# Patient Record
Sex: Male | Born: 1965 | Race: Black or African American | Hispanic: No | Marital: Single | Smoking: Former smoker
Health system: Southern US, Community
[De-identification: ages and names within clinical notes are randomized; demographics above are authoritative.]

## PROBLEM LIST (undated history)

## (undated) DIAGNOSIS — J45909 Unspecified asthma, uncomplicated: Secondary | ICD-10-CM

## (undated) DIAGNOSIS — C801 Malignant (primary) neoplasm, unspecified: Secondary | ICD-10-CM

## (undated) DIAGNOSIS — M1990 Unspecified osteoarthritis, unspecified site: Secondary | ICD-10-CM

## (undated) DIAGNOSIS — K603 Anal fistula, unspecified: Secondary | ICD-10-CM

## (undated) DIAGNOSIS — H9190 Unspecified hearing loss, unspecified ear: Secondary | ICD-10-CM

## (undated) DIAGNOSIS — E079 Disorder of thyroid, unspecified: Secondary | ICD-10-CM

## (undated) DIAGNOSIS — R350 Frequency of micturition: Secondary | ICD-10-CM

## (undated) DIAGNOSIS — K219 Gastro-esophageal reflux disease without esophagitis: Secondary | ICD-10-CM

## (undated) DIAGNOSIS — Z9221 Personal history of antineoplastic chemotherapy: Secondary | ICD-10-CM

## (undated) DIAGNOSIS — L729 Follicular cyst of the skin and subcutaneous tissue, unspecified: Secondary | ICD-10-CM

## (undated) HISTORY — PX: HEMORRHOID SURGERY: SHX153

## (undated) HISTORY — PX: VASECTOMY: SHX75

## (undated) HISTORY — PX: BLADDER SURGERY: SHX569

## (undated) HISTORY — PX: NO PAST SURGERIES: SHX2092

---

## 2015-10-05 ENCOUNTER — Encounter (HOSPITAL_COMMUNITY): Payer: Self-pay | Admitting: *Deleted

## 2015-10-05 ENCOUNTER — Emergency Department (HOSPITAL_COMMUNITY): Payer: Medicare Other

## 2015-10-05 ENCOUNTER — Emergency Department (HOSPITAL_COMMUNITY)
Admission: EM | Admit: 2015-10-05 | Discharge: 2015-10-05 | Disposition: A | Discharge status: Home / Self Care | Payer: Medicare Other | Attending: Emergency Medicine | Admitting: Emergency Medicine

## 2015-10-05 DIAGNOSIS — R079 Chest pain, unspecified: Secondary | ICD-10-CM | POA: Diagnosis not present

## 2015-10-05 DIAGNOSIS — Z87891 Personal history of nicotine dependence: Secondary | ICD-10-CM | POA: Diagnosis not present

## 2015-10-05 DIAGNOSIS — J45909 Unspecified asthma, uncomplicated: Secondary | ICD-10-CM | POA: Diagnosis present

## 2015-10-05 DIAGNOSIS — J4531 Mild persistent asthma with (acute) exacerbation: Secondary | ICD-10-CM | POA: Diagnosis not present

## 2015-10-05 DIAGNOSIS — R05 Cough: Secondary | ICD-10-CM | POA: Diagnosis not present

## 2015-10-05 HISTORY — DX: Unspecified asthma, uncomplicated: J45.909

## 2015-10-05 IMAGING — DX DG CHEST 2V
2 series · 2 of 2 positions shown · non-contrast
Comparison: None.

CLINICAL DATA: Chest pain for several days. Coughing and sore
throat. History of asthma.

EXAM:
CHEST  2 VIEW

[chest pa]
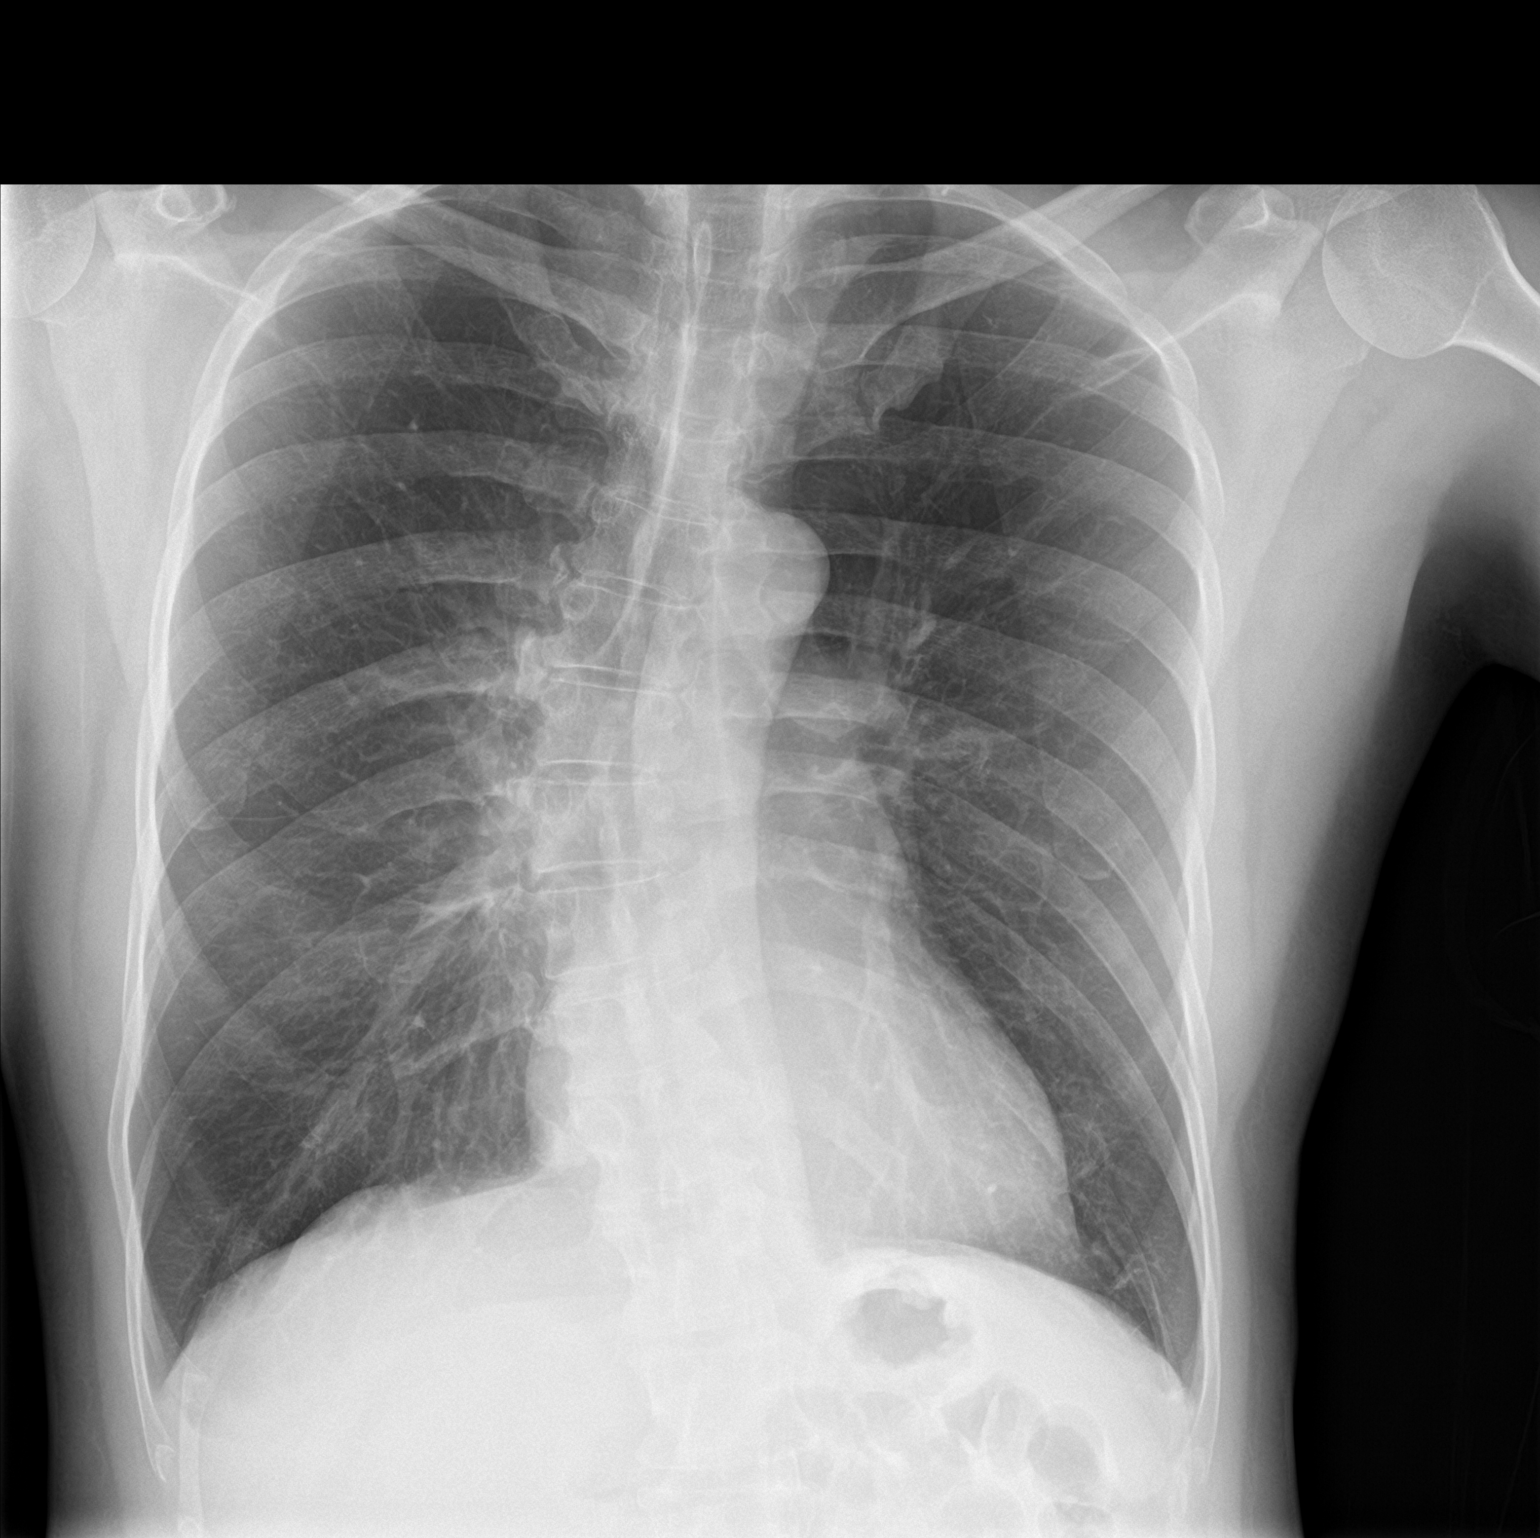

[chest lat]
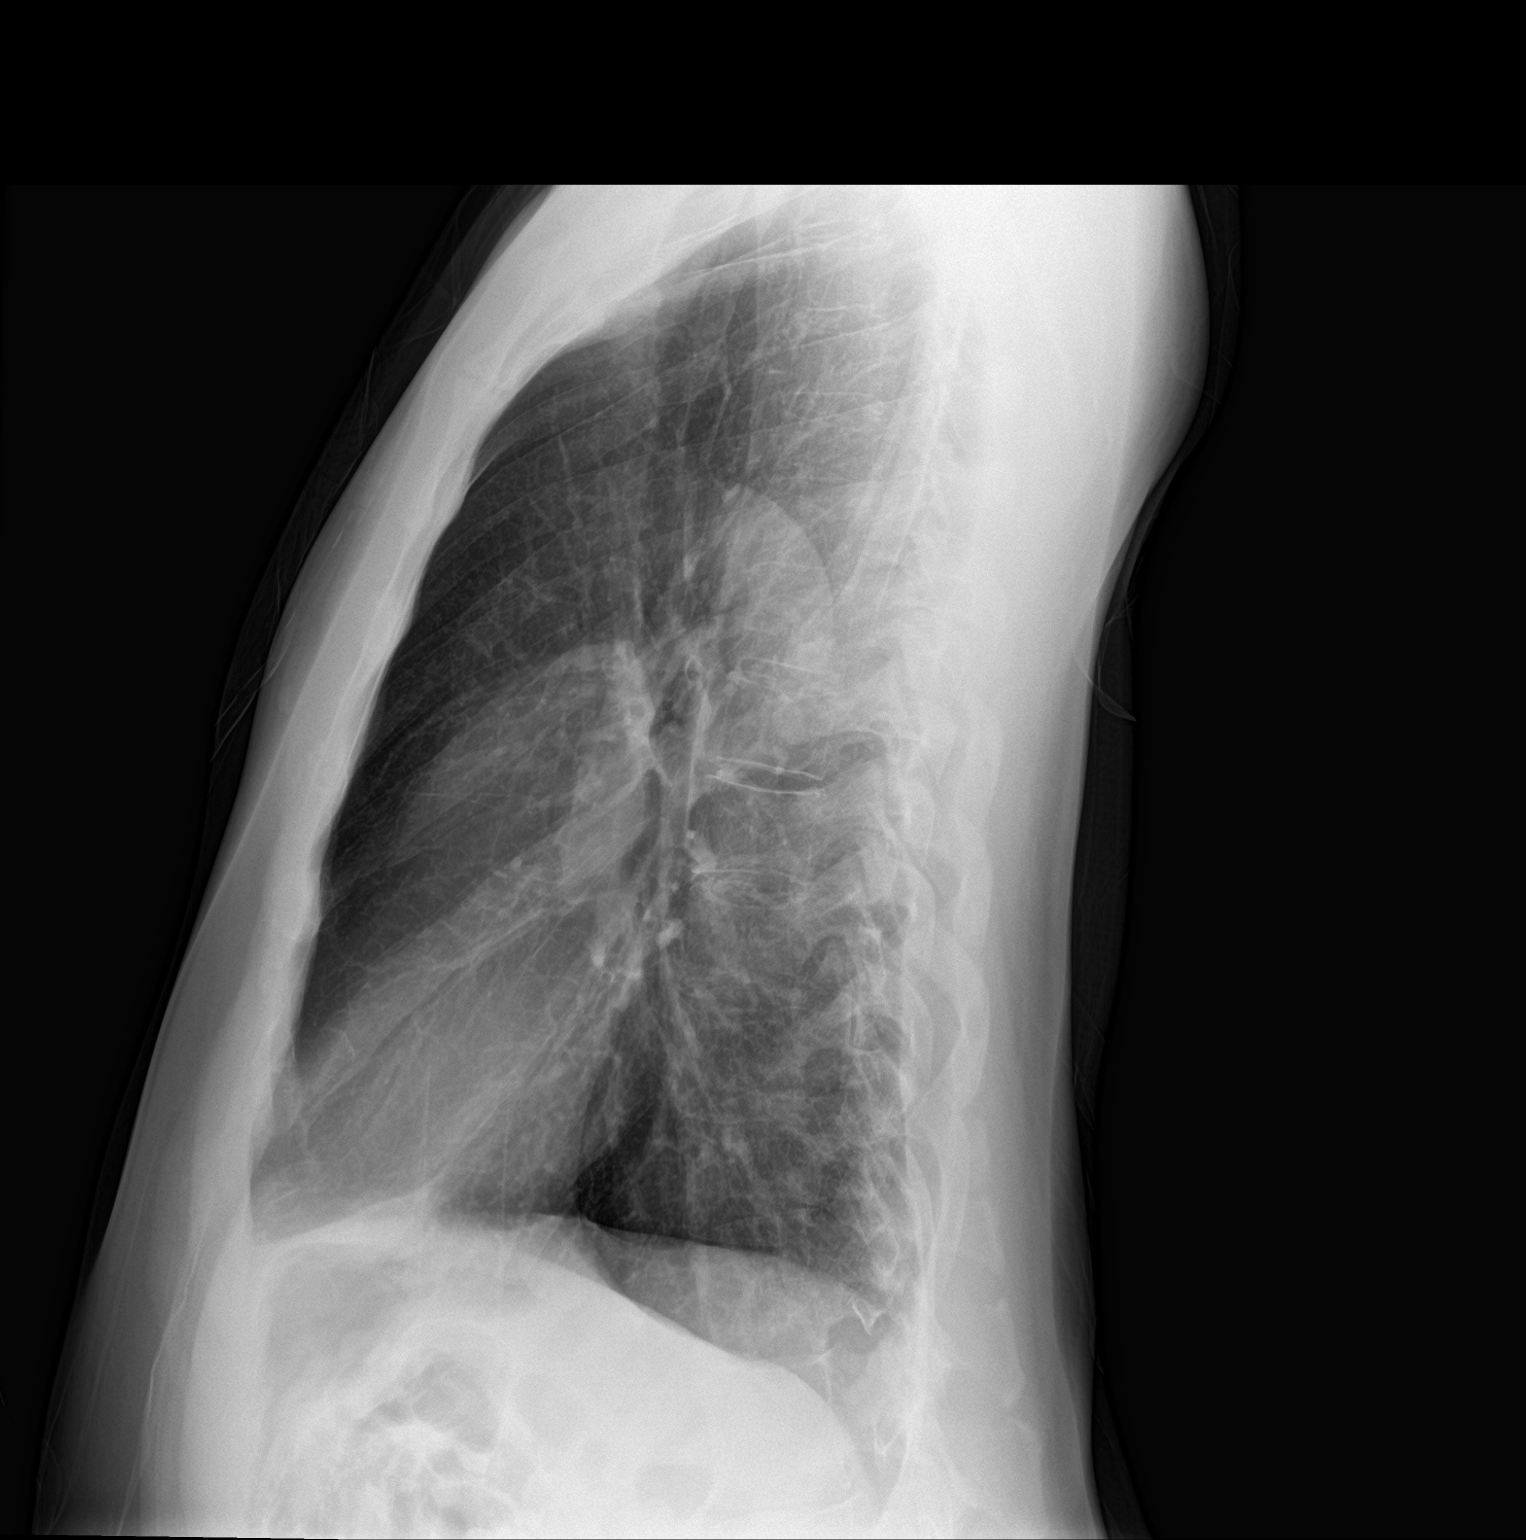

[2 of 2 positions shown; findings below may reference images not displayed]

FINDINGS: The cardiac silhouette, mediastinal and hilar contours are normal.
Moderate hyperinflation of the lungs. No infiltrates or effusions.
Right convex thoracic scoliosis. Reversal of the normal thoracic
kyphosis.
IMPRESSION: Hyperinflation but no infiltrates, edema or effusions.

## 2015-10-05 MED ORDER — ALBUTEROL SULFATE HFA 108 (90 BASE) MCG/ACT IN AERS: 1.0000 | INHALATION_SPRAY | Freq: Four times a day (QID) | RESPIRATORY_TRACT | 0 refills | Status: DC | PRN

## 2015-10-05 MED ORDER — ALBUTEROL SULFATE (2.5 MG/3ML) 0.083% IN NEBU
5.0000 mg | INHALATION_SOLUTION | Freq: Once | RESPIRATORY_TRACT | Status: AC
  Administered 2015-10-05: 5 mg via RESPIRATORY_TRACT
  Filled 2015-10-05: qty 6

## 2015-10-05 MED ORDER — IPRATROPIUM BROMIDE 0.02 % IN SOLN
0.5000 mg | Freq: Once | RESPIRATORY_TRACT | Status: AC
  Administered 2015-10-05: 0.5 mg via RESPIRATORY_TRACT
  Filled 2015-10-05: qty 2.5

## 2015-10-05 MED ORDER — ALBUTEROL SULFATE HFA 108 (90 BASE) MCG/ACT IN AERS
2.0000 | INHALATION_SPRAY | RESPIRATORY_TRACT | Status: DC | PRN
  Administered 2015-10-05: 2 via RESPIRATORY_TRACT
  Filled 2015-10-05: qty 6.7

## 2015-10-05 MED ORDER — ALBUTEROL SULFATE (2.5 MG/3ML) 0.083% IN NEBU
2.5000 mg | INHALATION_SOLUTION | Freq: Once | RESPIRATORY_TRACT | Status: AC
  Administered 2015-10-05: 2.5 mg via RESPIRATORY_TRACT
  Filled 2015-10-05: qty 3

## 2015-10-05 NOTE — ED Provider Notes (Signed)
[facility] DEPT Provider Note   CSN: [id number] Arrival date & time: 10/05/15  1813     History   Chief Complaint Chief Complaint  Patient presents with  . Asthma    HPI Alexander Reeves is a 50 y.o. male.  This is a 50 year old male with a history of asthma who recently relocated to [place] from [place].  He states for the past couple months he's had intermittent episodes of shortness of breath and wheezing on his inhaler is empty and he left his nebulizer machine behind with his packed physicians.  He denies any fever, rhinitis. He does state that he has a history of thyroid disease and is out of his thyroid medication for the past 3 months.  Patient is deaf and I did use the sign language interpreter.  Computer for assistance.      Past Medical History:  Diagnosis Date  . Asthma     There are no active problems to display for this patient.   History reviewed. No pertinent surgical history.     Home Medications    Prior to Admission medications   Medication Sig Start Date End Date Taking? Authorizing Provider  albuterol (PROVENTIL HFA;VENTOLIN HFA) 108 (90 Base) MCG/ACT inhaler Inhale 1-2 puffs into the lungs every 6 (six) hours as needed for wheezing or shortness of breath. 10/05/15   Junius Creamer, NP    Family History No family history on file.  Social History Social History  Substance Use Topics  . Smoking status: Former Research scientist (life sciences)  . Smokeless tobacco: Never Used  . Alcohol use No     Allergies   Review of patient's allergies indicates no known allergies.   Review of Systems Review of Systems  Constitutional: Negative for fever.  HENT: Positive for sore throat.   Respiratory: Positive for wheezing.   All other systems reviewed and are negative.    Physical Exam Updated Vital Signs BP 138/72 (BP Location: Right Arm)   Pulse 94   Temp 98.6 F (37 C) (Oral)   Ht 5\' 10"  (1.778 m)   Wt 84.4 kg   SpO2 100%   BMI 26.69 kg/m    Physical Exam  Constitutional: He appears well-developed and well-nourished.  Eyes: Pupils are equal, round, and reactive to light.  Cardiovascular: Normal rate.   Pulmonary/Chest: He has wheezes.  I examined the patient, after received an albuterol Atrovent treatment in triage.  She still has a slight expiratory wheeze in the left base  Lymphadenopathy:    He has no cervical adenopathy.  Neurological: He is alert.  Nursing note and vitals reviewed.    ED Treatments / Results  Labs (all labs ordered are listed, but only abnormal results are displayed) Labs Reviewed - No data to display  EKG  EKG Interpretation None       Radiology Dg Chest 2 View  Result Date: 10/05/2015 CLINICAL DATA:  Chest pain for several days. Coughing and sore throat. History of asthma. EXAM: CHEST  2 VIEW COMPARISON:  None. FINDINGS: The cardiac silhouette, mediastinal and hilar contours are normal. Moderate hyperinflation of the lungs. No infiltrates or effusions. Right convex thoracic scoliosis. Reversal of the normal thoracic kyphosis. IMPRESSION: Hyperinflation but no infiltrates, edema or effusions. Electronically Signed   By: Marijo Sanes M.D.   On: 10/05/2015 19:12    Procedures Procedures (including critical care time)  Medications Ordered in ED Medications  albuterol (PROVENTIL HFA;VENTOLIN HFA) 108 (90 Base) MCG/ACT inhaler 2 puff (not administered)  albuterol (PROVENTIL) (2.5  MG/3ML) 0.083% nebulizer solution 5 mg (5 mg Nebulization Given 10/05/15 1837)  ipratropium (ATROVENT) nebulizer solution 0.5 mg (0.5 mg Nebulization Given 10/05/15 1837)  albuterol (PROVENTIL) (2.5 MG/3ML) 0.083% nebulizer solution 2.5 mg (2.5 mg Nebulization Given 10/05/15 2120)     Initial Impression / Assessment and Plan / ED Course  I have reviewed the triage vital signs and the nursing notes.  Pertinent labs & imaging results that were available during my care of the patient were reviewed by me and  considered in my medical decision making (see chart for details).  Patient's x-ray was reviewed.  There is no infiltrate to indicate a pneumonia examined after second nebulizer treatment given.  He is clear.  He has been provided with a inhaler plus a prescription for additional inhaler and given a referral to community wellness to establish medical care.  Final Clinical Impressions(s) / ED Diagnoses   Final diagnoses:  Asthma, mild persistent, with acute exacerbation    New Prescriptions New Prescriptions   ALBUTEROL (PROVENTIL HFA;VENTOLIN HFA) 108 (90 BASE) MCG/ACT INHALER    Inhale 1-2 puffs into the lungs every 6 (six) hours as needed for wheezing or shortness of breath.     Junius Creamer, NP 10/05/15 [place], MD 10/07/15 [phone]

## 2015-10-05 NOTE — ED Triage Notes (Signed)
The pt is c/o his asthma bothering him for several days.  He moved here from [place] .  He is out of his inhaler he is also c/o coughing and a sorehtroat

## 2015-10-05 NOTE — ED Triage Notes (Signed)
The pt is deaf  Information from the intrepreter machine

## 2015-10-05 NOTE — Discharge Instructions (Signed)
You treated for asthma exacerbation.  You were provided with an inhaler.  Please uses as needed 2 puffs every 4-6 hours while awake.  You've also been given a referral to community wellness, please go to the clinic to make an appointment for both you and your wife to establish medical care.  You've also been given a prescription for an additional inhaler.

## 2015-12-07 ENCOUNTER — Emergency Department (HOSPITAL_COMMUNITY): Payer: Medicare Other

## 2015-12-07 ENCOUNTER — Encounter (HOSPITAL_COMMUNITY): Payer: Self-pay | Admitting: Emergency Medicine

## 2015-12-07 ENCOUNTER — Emergency Department (HOSPITAL_COMMUNITY)
Admission: EM | Admit: 2015-12-07 | Discharge: 2015-12-07 | Disposition: A | Discharge status: Home / Self Care | Payer: Medicare Other | Attending: Emergency Medicine | Admitting: Emergency Medicine

## 2015-12-07 DIAGNOSIS — J45901 Unspecified asthma with (acute) exacerbation: Secondary | ICD-10-CM | POA: Insufficient documentation

## 2015-12-07 DIAGNOSIS — Z87891 Personal history of nicotine dependence: Secondary | ICD-10-CM | POA: Diagnosis not present

## 2015-12-07 DIAGNOSIS — R0602 Shortness of breath: Secondary | ICD-10-CM | POA: Diagnosis not present

## 2015-12-07 HISTORY — DX: Disorder of thyroid, unspecified: E07.9

## 2015-12-07 LAB — BASIC METABOLIC PANEL
ANION GAP: 10 (ref 5–15)
BUN: 15 mg/dL (ref 6–20)
CALCIUM: 9 mg/dL (ref 8.9–10.3)
CO2: 26 mmol/L (ref 22–32)
Chloride: 103 mmol/L (ref 101–111)
Creatinine, Ser: 1.31 mg/dL — ABNORMAL HIGH (ref 0.61–1.24)
GFR calc non Af Amer: >60 mL/min (ref >60)
GLUCOSE: 115 mg/dL — AB (ref 65–99)
POTASSIUM: 3.8 mmol/L (ref 3.5–5.1)
Sodium: 139 mmol/L (ref 135–145)

## 2015-12-07 LAB — CBC WITH DIFFERENTIAL/PLATELET
BASOS ABS: 0 10*3/uL (ref 0.0–0.1)
BASOS PCT: 0 %
Eosinophils Absolute: 0.1 10*3/uL (ref 0.0–0.7)
Eosinophils Relative: 1 %
HEMATOCRIT: 41.8 % (ref 39.0–52.0)
HEMOGLOBIN: 13.2 g/dL (ref 13.0–17.0)
LYMPHS PCT: 24 %
Lymphs Abs: 1.9 10*3/uL (ref 0.7–4.0)
MCH: 26 pg (ref 26.0–34.0)
MCHC: 31.6 g/dL (ref 30.0–36.0)
MCV: 82.3 fL (ref 78.0–100.0)
MONO ABS: 0.2 10*3/uL (ref 0.1–1.0)
Monocytes Relative: 3 %
NEUTROS ABS: 5.7 10*3/uL (ref 1.7–7.7)
NEUTROS PCT: 72 %
Platelets: 125 10*3/uL — ABNORMAL LOW (ref 150–400)
RBC: 5.08 MIL/uL (ref 4.22–5.81)
RDW: 15 % (ref 11.5–15.5)
WBC: 7.9 10*3/uL (ref 4.0–10.5)

## 2015-12-07 IMAGING — CR DG CHEST 2V
2 series · 2 of 2 positions shown · non-contrast
Comparison: [DATE]

CLINICAL DATA: Shortness of breath and throat tightness. History of
asthma.

EXAM:
CHEST  2 VIEW

[chest pa]
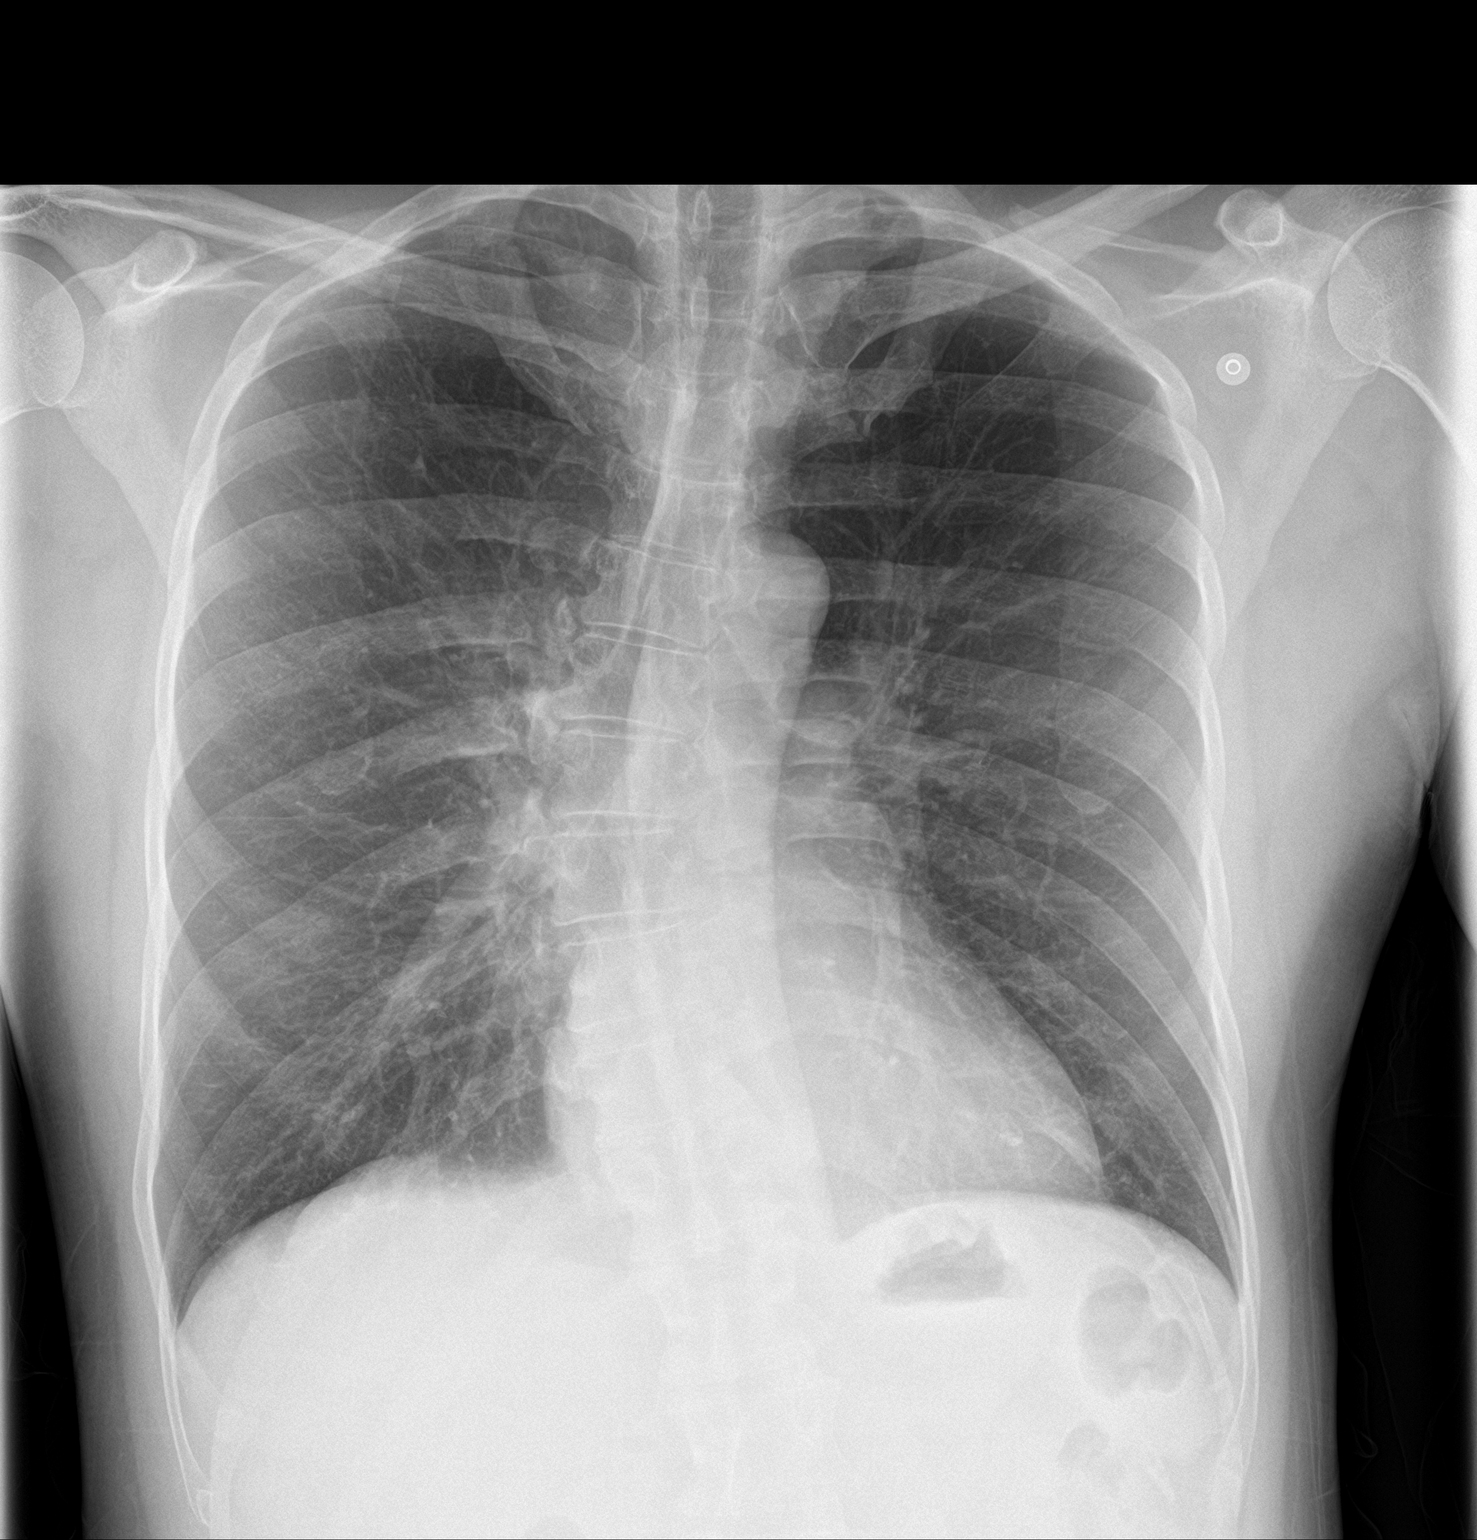

[chest lat]
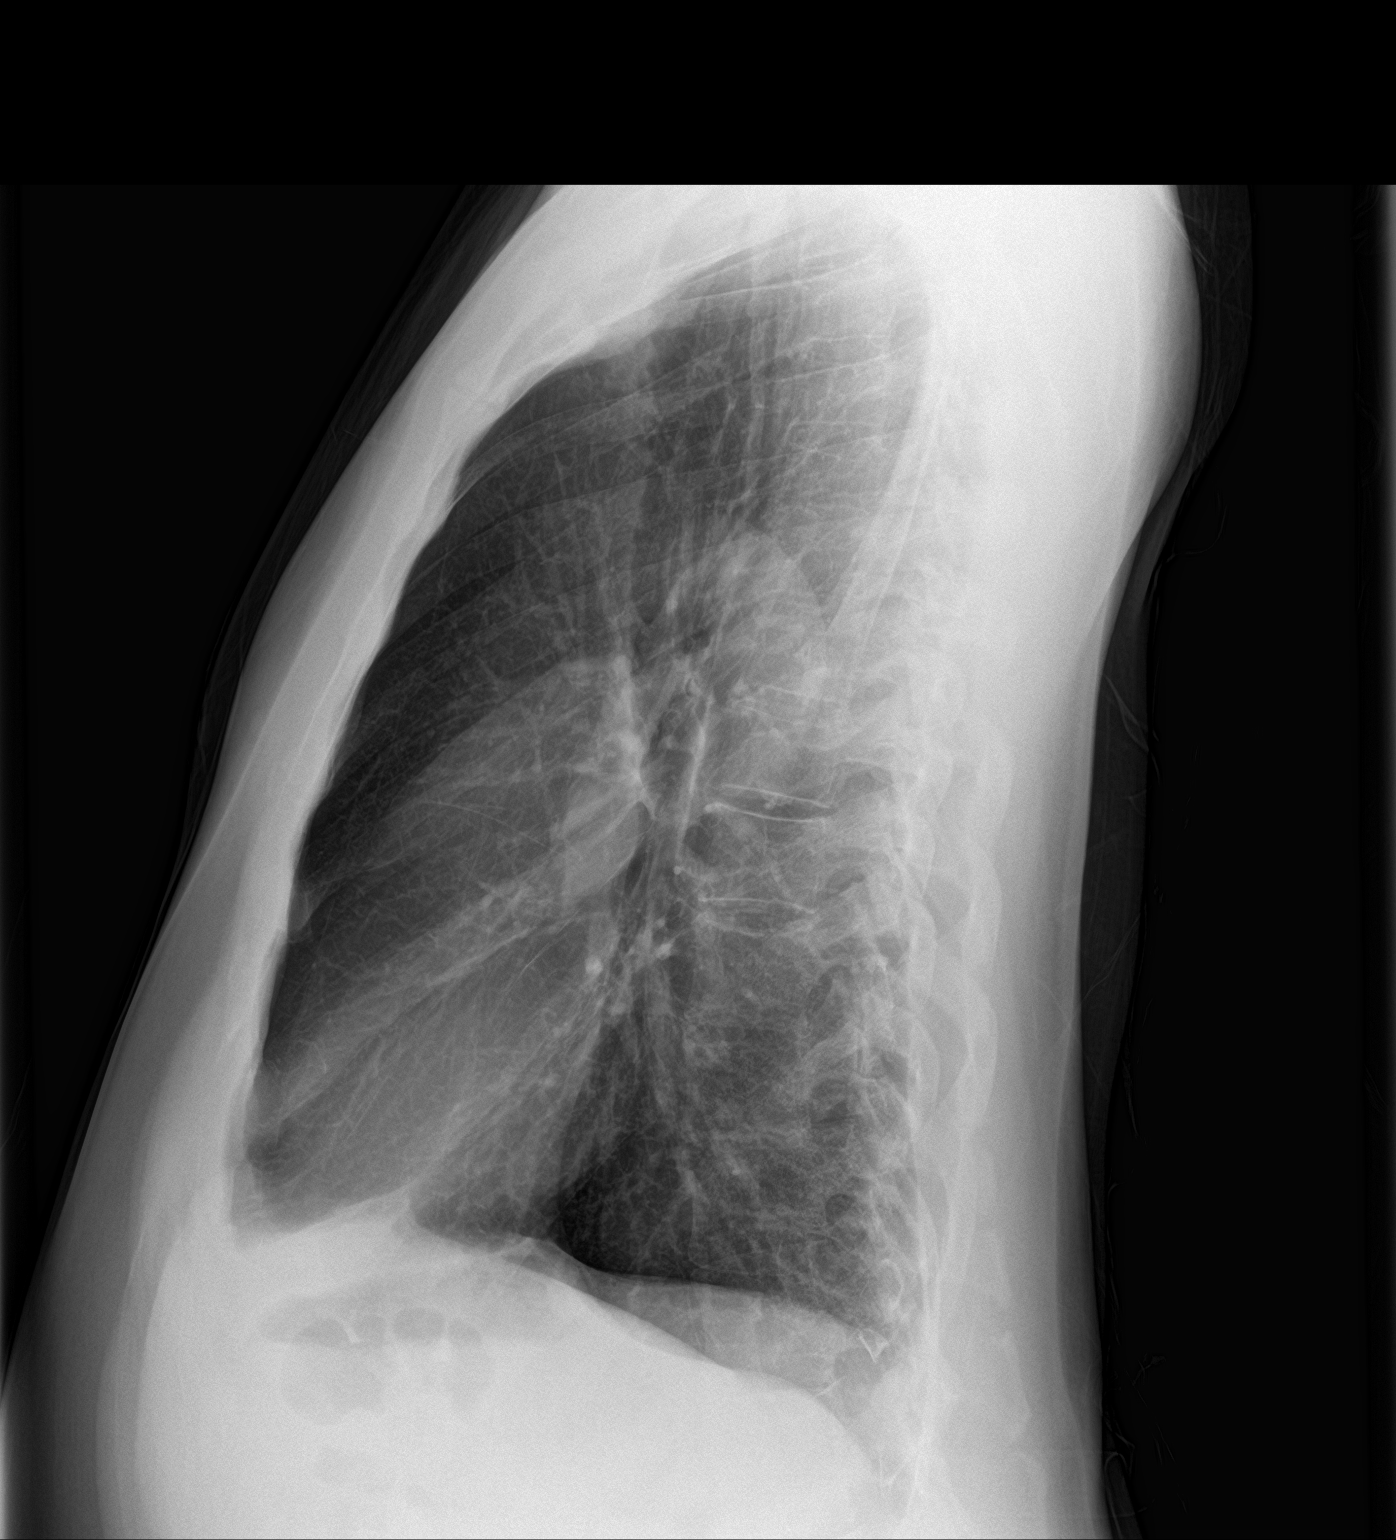

[2 of 2 positions shown; findings below may reference images not displayed]

FINDINGS: Hyperinflation with mild interstitial changes in the lung bases
consistent with history of asthma. No focal airspace disease or
consolidation. No blunting of costophrenic angles. No pneumothorax.
Mediastinal contours appear intact. Thoracic scoliosis convex
towards the right
IMPRESSION: Hyperinflation.  No evidence of active pulmonary disease.

## 2015-12-07 MED ORDER — ALBUTEROL (5 MG/ML) CONTINUOUS INHALATION SOLN
10.0000 mg/h | INHALATION_SOLUTION | RESPIRATORY_TRACT | Status: DC
  Administered 2015-12-07: 10 mg/h via RESPIRATORY_TRACT
  Filled 2015-12-07: qty 20

## 2015-12-07 MED ORDER — ALBUTEROL SULFATE HFA 108 (90 BASE) MCG/ACT IN AERS
2.0000 | INHALATION_SPRAY | Freq: Once | RESPIRATORY_TRACT | Status: AC
  Administered 2015-12-07: 2 via RESPIRATORY_TRACT
  Filled 2015-12-07: qty 6.7

## 2015-12-07 MED ORDER — PREDNISONE 20 MG PO TABS: 60.0000 mg | ORAL_TABLET | Freq: Every day | ORAL | 0 refills | Status: DC

## 2015-12-07 MED ORDER — IPRATROPIUM BROMIDE 0.02 % IN SOLN
0.5000 mg | Freq: Once | RESPIRATORY_TRACT | Status: AC
  Administered 2015-12-07: 0.5 mg via RESPIRATORY_TRACT
  Filled 2015-12-07: qty 2.5

## 2015-12-07 MED ORDER — ALBUTEROL SULFATE (2.5 MG/3ML) 0.083% IN NEBU: 2.5000 mg | INHALATION_SOLUTION | Freq: Four times a day (QID) | RESPIRATORY_TRACT | 0 refills | Status: DC | PRN

## 2015-12-07 MED ORDER — MAGNESIUM SULFATE 2 GM/50ML IV SOLN
2.0000 g | Freq: Once | INTRAVENOUS | Status: AC
  Administered 2015-12-07: 2 g via INTRAVENOUS
  Filled 2015-12-07: qty 50

## 2015-12-07 NOTE — ED Provider Notes (Signed)
[facility] DEPT Provider Note   CSN: [id number] Arrival date & time: 12/07/15  0419     History   Chief Complaint Chief Complaint  Patient presents with  . Shortness of Breath    HPI Alexander Reeves is a 50 y.o. male.With a past medical history of asthma presenting today with shortness of breath. Patient ran out of his albuterol medication. EMS states he is 89% on room air with decreased breath sounds. He was given albuterol 5 mg, ipratropium 0.5 mg, and Solu-Medrol 125 mg. Patient states his breathing is better. He denies any chest pain, fever, productive cough. There are no further complaints.  10 Systems reviewed and are negative for acute change except as noted in the HPI.    HPI  Past Medical History:  Diagnosis Date  . Asthma     There are no active problems to display for this patient.   No past surgical history on file.     Home Medications    Prior to Admission medications   Medication Sig Start Date End Date Taking? Authorizing Provider  albuterol (PROVENTIL HFA;VENTOLIN HFA) 108 (90 Base) MCG/ACT inhaler Inhale 1-2 puffs into the lungs every 6 (six) hours as needed for wheezing or shortness of breath. 10/05/15   Junius Creamer, NP    Family History No family history on file.  Social History Social History  Substance Use Topics  . Smoking status: Former Research scientist (life sciences)  . Smokeless tobacco: Never Used  . Alcohol use No     Allergies   Patient has no known allergies.   Review of Systems Review of Systems   Physical Exam Updated Vital Signs BP 106/88 (BP Location: Right Arm)   Pulse 91   Temp 97.6 F (36.4 C) (Oral)   Resp 20   SpO2 100%   Physical Exam  Constitutional: He is oriented to person, place, and time. Vital signs are normal. He appears well-developed and well-nourished.  Non-toxic appearance. He does not appear ill. No distress.  Breathing treatment currently in progress  HENT:  Head: Normocephalic and atraumatic.  Nose: Nose  normal.  Mouth/Throat: Oropharynx is clear and moist. No oropharyngeal exudate.  Eyes: Conjunctivae and EOM are normal. Pupils are equal, round, and reactive to light. No scleral icterus.  Neck: Normal range of motion. Neck supple. No tracheal deviation, no edema, no erythema and normal range of motion present. No thyroid mass and no thyromegaly present.  Cardiovascular: Normal rate, regular rhythm, S1 normal, S2 normal, normal heart sounds, intact distal pulses and normal pulses.  Exam reveals no gallop and no friction rub.   No murmur heard. Pulmonary/Chest: Effort normal. No respiratory distress. He has wheezes. He has no rhonchi. He has no rales.  Decreased breath sounds bilaterally. There is wheezing heard bilaterally expiratory.  Abdominal: Soft. Normal appearance and bowel sounds are normal. He exhibits no distension, no ascites and no mass. There is no hepatosplenomegaly. There is no tenderness. There is no rebound, no guarding and no CVA tenderness.  Musculoskeletal: Normal range of motion. He exhibits no edema or tenderness.  Lymphadenopathy:    He has no cervical adenopathy.  Neurological: He is alert and oriented to person, place, and time. He has normal strength. No cranial nerve deficit or sensory deficit.  Skin: Skin is warm, dry and intact. No petechiae and no rash noted. He is not diaphoretic. No erythema. No pallor.  Nursing note and vitals reviewed.    ED Treatments / Results  Labs (all labs ordered are listed,  but only abnormal results are displayed) Labs Reviewed  CBC WITH DIFFERENTIAL/PLATELET  BASIC METABOLIC PANEL    EKG  EKG Interpretation None       Radiology No results found.  Procedures Procedures (including critical care time)  Medications Ordered in ED Medications  albuterol (PROVENTIL,VENTOLIN) solution continuous neb (not administered)  ipratropium (ATROVENT) nebulizer solution 0.5 mg (not administered)  magnesium sulfate IVPB 2 g 50 mL (not  administered)     Initial Impression / Assessment and Plan / ED Course  I have reviewed the triage vital signs and the nursing notes.  Pertinent labs & imaging results that were available during my care of the patient were reviewed by me and considered in my medical decision making (see chart for details).  Clinical Course     Patient presents to the emergency department for asthma exacerbation. He was given continuous epidural treatment, magnesium, and will obtain a chest x-ray to evaluate for any pneumonia. Patient will need to be observed in emergency department for improvement of his respiratory distress.  5:57 AM Upon repeat evaluation, patient is sleeping and breathing comfortably without any wheezing on exam.  Patient also states he feels much better.  Will give him an inhaler to go home with.  Also prednisone for 4 more days.  PCP fu advised within 3 days.  There is no hypoxia on RA.  He appears well and in NAD.  Vs remain within his normal limits, patient tachycardic from albuterol use, and he is safe for DC.  CRITICAL CARE Performed by: Everlene Balls   Total critical care time: 45 minutes - resp distress requiring continuous albuterol  Critical care time was exclusive of separately billable procedures and treating other patients.  Critical care was necessary to treat or prevent imminent or life-threatening deterioration.  Critical care was time spent personally by me on the following activities: development of treatment plan with patient and/or surrogate as well as nursing, discussions with consultants, evaluation of patient's response to treatment, examination of patient, obtaining history from patient or surrogate, ordering and performing treatments and interventions, ordering and review of laboratory studies, ordering and review of radiographic studies, pulse oximetry and re-evaluation of patient's condition.   Final Clinical Impressions(s) / ED Diagnoses   Final diagnoses:    None    New Prescriptions New Prescriptions   No medications on file     Everlene Balls, MD 12/07/15 [phone]

## 2015-12-07 NOTE — ED Notes (Signed)
Pt ambulated on the hallway with steady gait and SPO2 of 97- 98%, NAD noticed.

## 2015-12-07 NOTE — ED Triage Notes (Signed)
Pt brought to ED by [facility] from home for c/o SOB and throat tightness, pt is deaf and has a Hx of Asthma, Albuterol treatment X2, Atrovent 0.5 and 125 mg Solumedrol IV given by EMS, pt 89% on RA on EMS arrival to his house, denies any fever or chills.

## 2016-05-26 ENCOUNTER — Encounter (HOSPITAL_COMMUNITY): Payer: Self-pay | Admitting: Nurse Practitioner

## 2016-05-26 ENCOUNTER — Emergency Department (HOSPITAL_COMMUNITY)
Admission: EM | Admit: 2016-05-26 | Discharge: 2016-05-26 | Disposition: A | Discharge status: Home / Self Care | Payer: Medicare Other | Attending: Emergency Medicine | Admitting: Emergency Medicine

## 2016-05-26 ENCOUNTER — Emergency Department (HOSPITAL_COMMUNITY): Payer: Medicare Other

## 2016-05-26 ENCOUNTER — Ambulatory Visit (INDEPENDENT_AMBULATORY_CARE_PROVIDER_SITE_OTHER): Payer: Medicare Other | Admitting: Physician Assistant

## 2016-05-26 DIAGNOSIS — R918 Other nonspecific abnormal finding of lung field: Secondary | ICD-10-CM | POA: Diagnosis not present

## 2016-05-26 DIAGNOSIS — Z87891 Personal history of nicotine dependence: Secondary | ICD-10-CM | POA: Insufficient documentation

## 2016-05-26 DIAGNOSIS — J45909 Unspecified asthma, uncomplicated: Secondary | ICD-10-CM | POA: Diagnosis not present

## 2016-05-26 DIAGNOSIS — R1310 Dysphagia, unspecified: Secondary | ICD-10-CM | POA: Diagnosis not present

## 2016-05-26 DIAGNOSIS — J189 Pneumonia, unspecified organism: Secondary | ICD-10-CM | POA: Diagnosis not present

## 2016-05-26 DIAGNOSIS — J029 Acute pharyngitis, unspecified: Secondary | ICD-10-CM | POA: Diagnosis not present

## 2016-05-26 DIAGNOSIS — R0602 Shortness of breath: Secondary | ICD-10-CM | POA: Diagnosis present

## 2016-05-26 HISTORY — DX: Unspecified hearing loss, unspecified ear: H91.90

## 2016-05-26 IMAGING — DX DG CHEST 2V
2 series · 2 of 2 positions shown · non-contrast
Comparison: [DATE]

CLINICAL DATA: Patient reports X 2 weeks he has been having trouble
with his throat, 3 years ago he reports he was diagnosed with a
thyroid disease, reports he was given medicine and it helped but for
the last 2 weeks the medicine has not been ...*comment was
truncated*

EXAM:
CHEST  2 VIEW

[w chest pa]
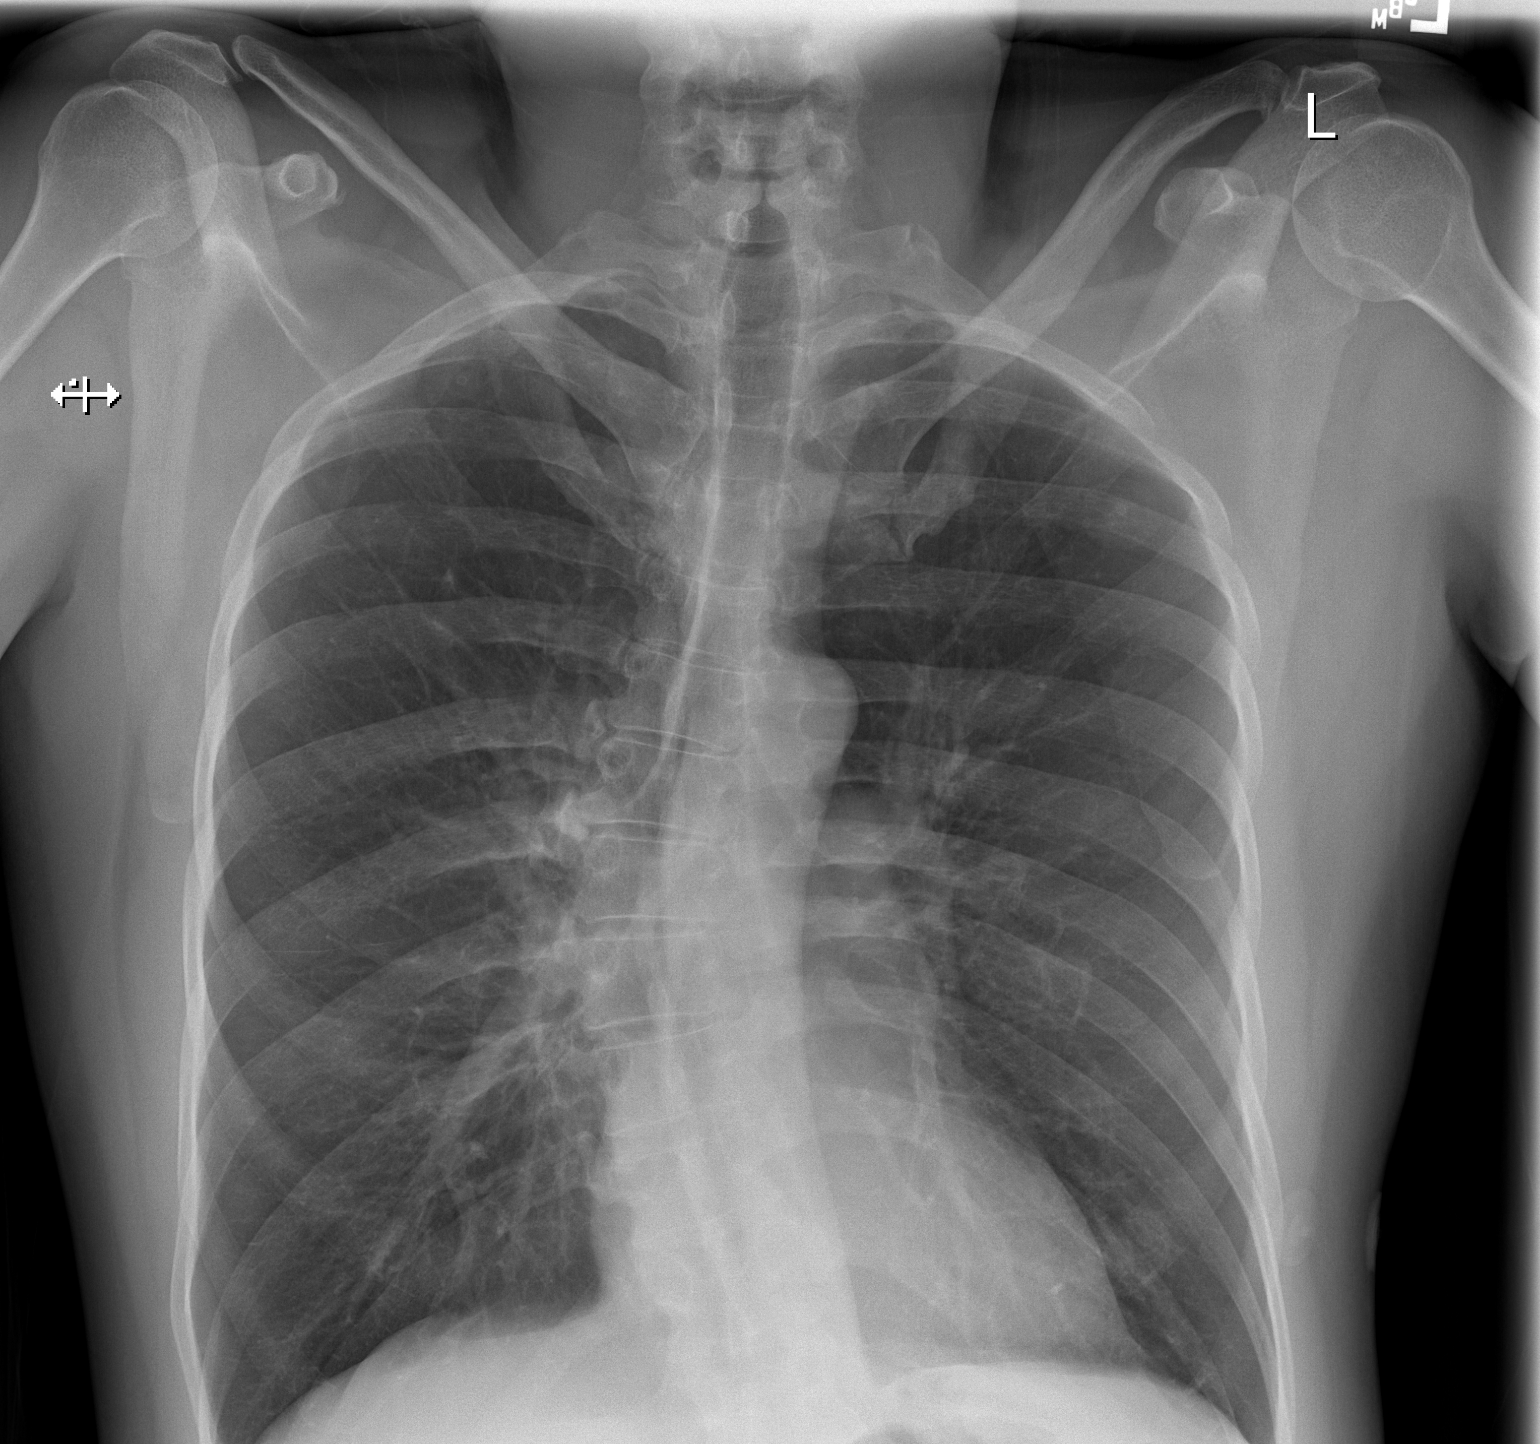

[w chest lat]
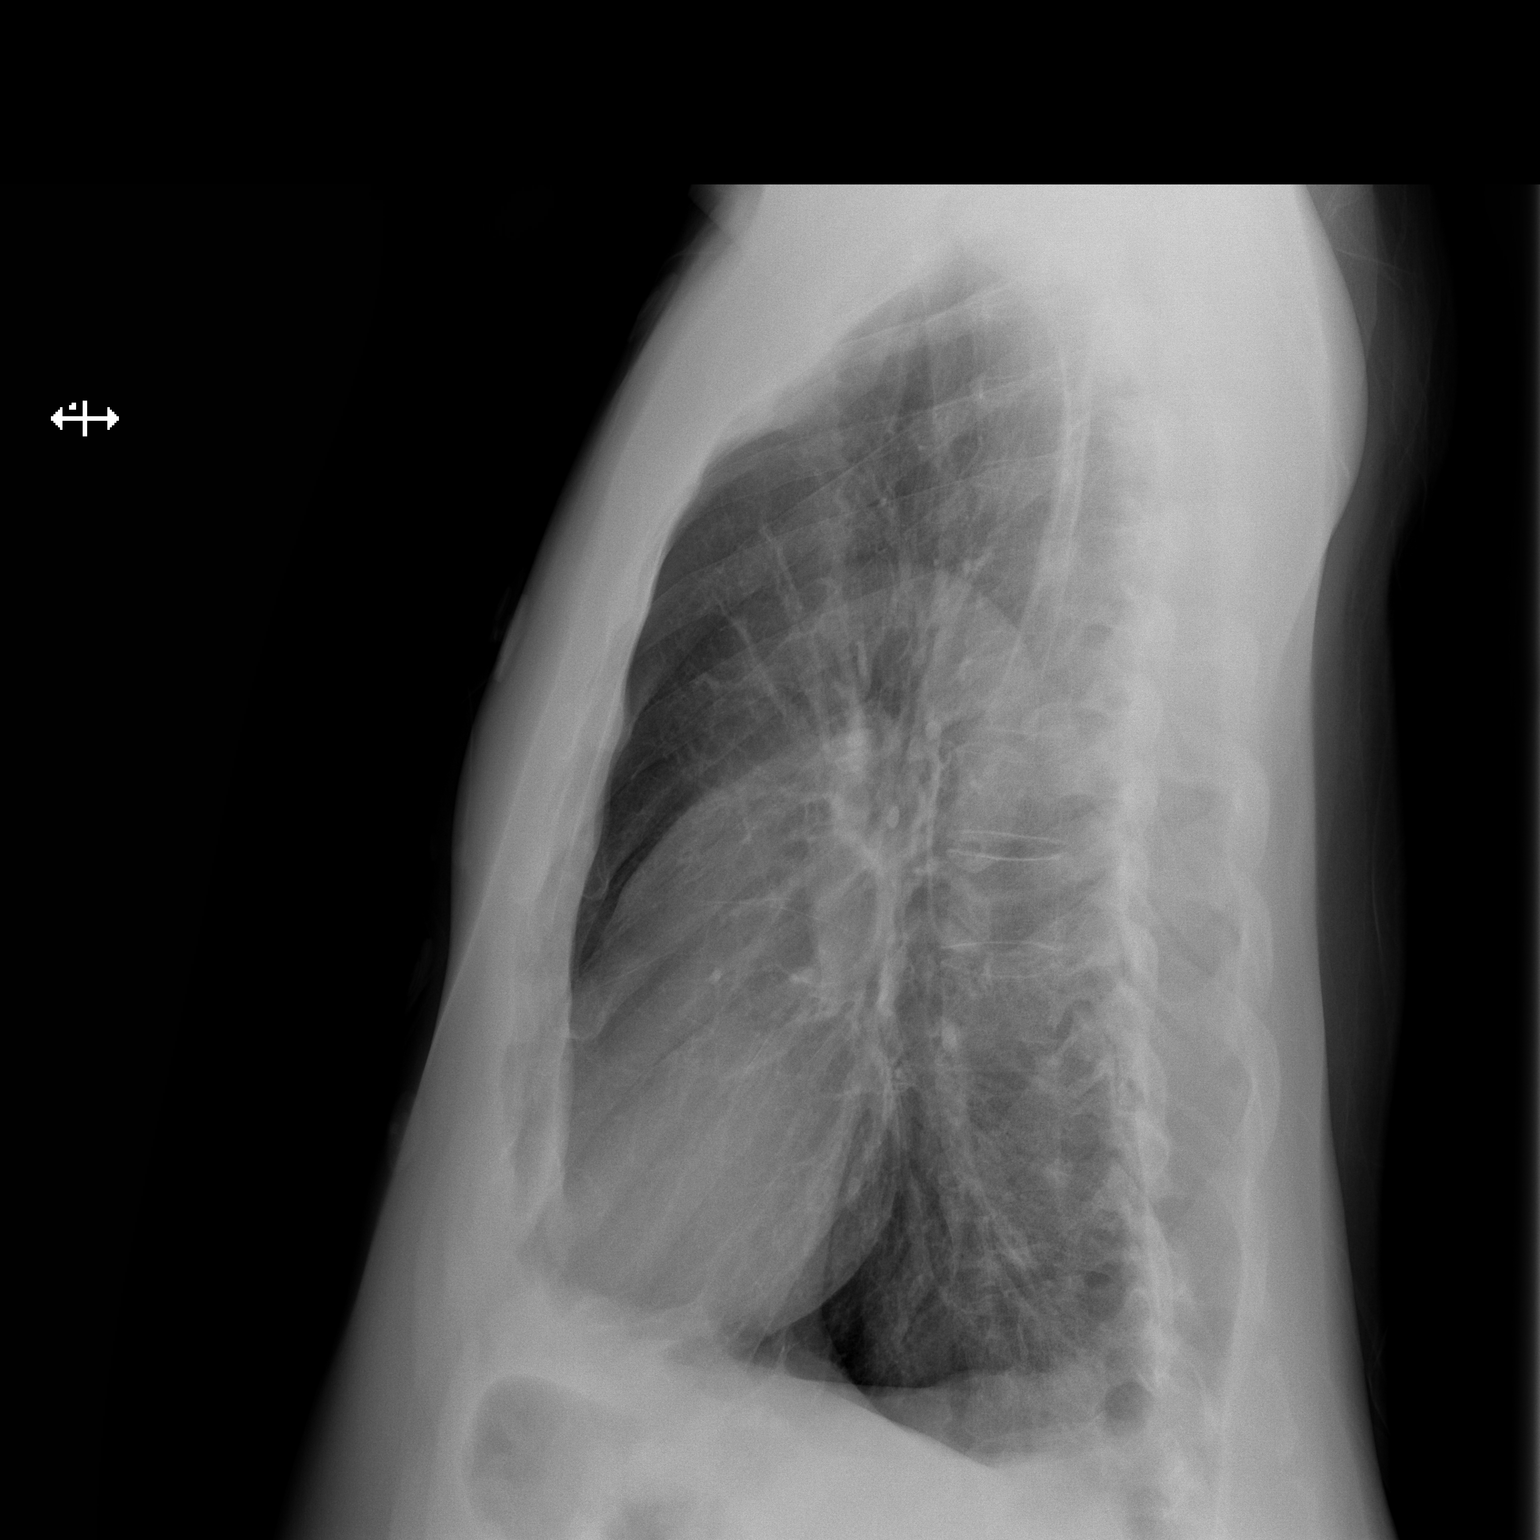

[2 of 2 positions shown; findings below may reference images not displayed]

FINDINGS: Normal cardiac silhouette. Lungs are hyperinflated. No effusion,
infiltrate pneumothorax. Scoliosis noted.
IMPRESSION: 1.  No acute cardiopulmonary process.
2. Hyperinflated lungs.

## 2016-05-26 IMAGING — DX DG NECK SOFT TISSUE
2 series · 2 of 2 positions shown · non-contrast
Comparison: None.

CLINICAL DATA: Dysphagia with sore throat. Symptoms 2 weeks ago.
Headache, body ache, cough, fever.

EXAM:
NECK SOFT TISSUES - 1+ VIEW

[neck lat]
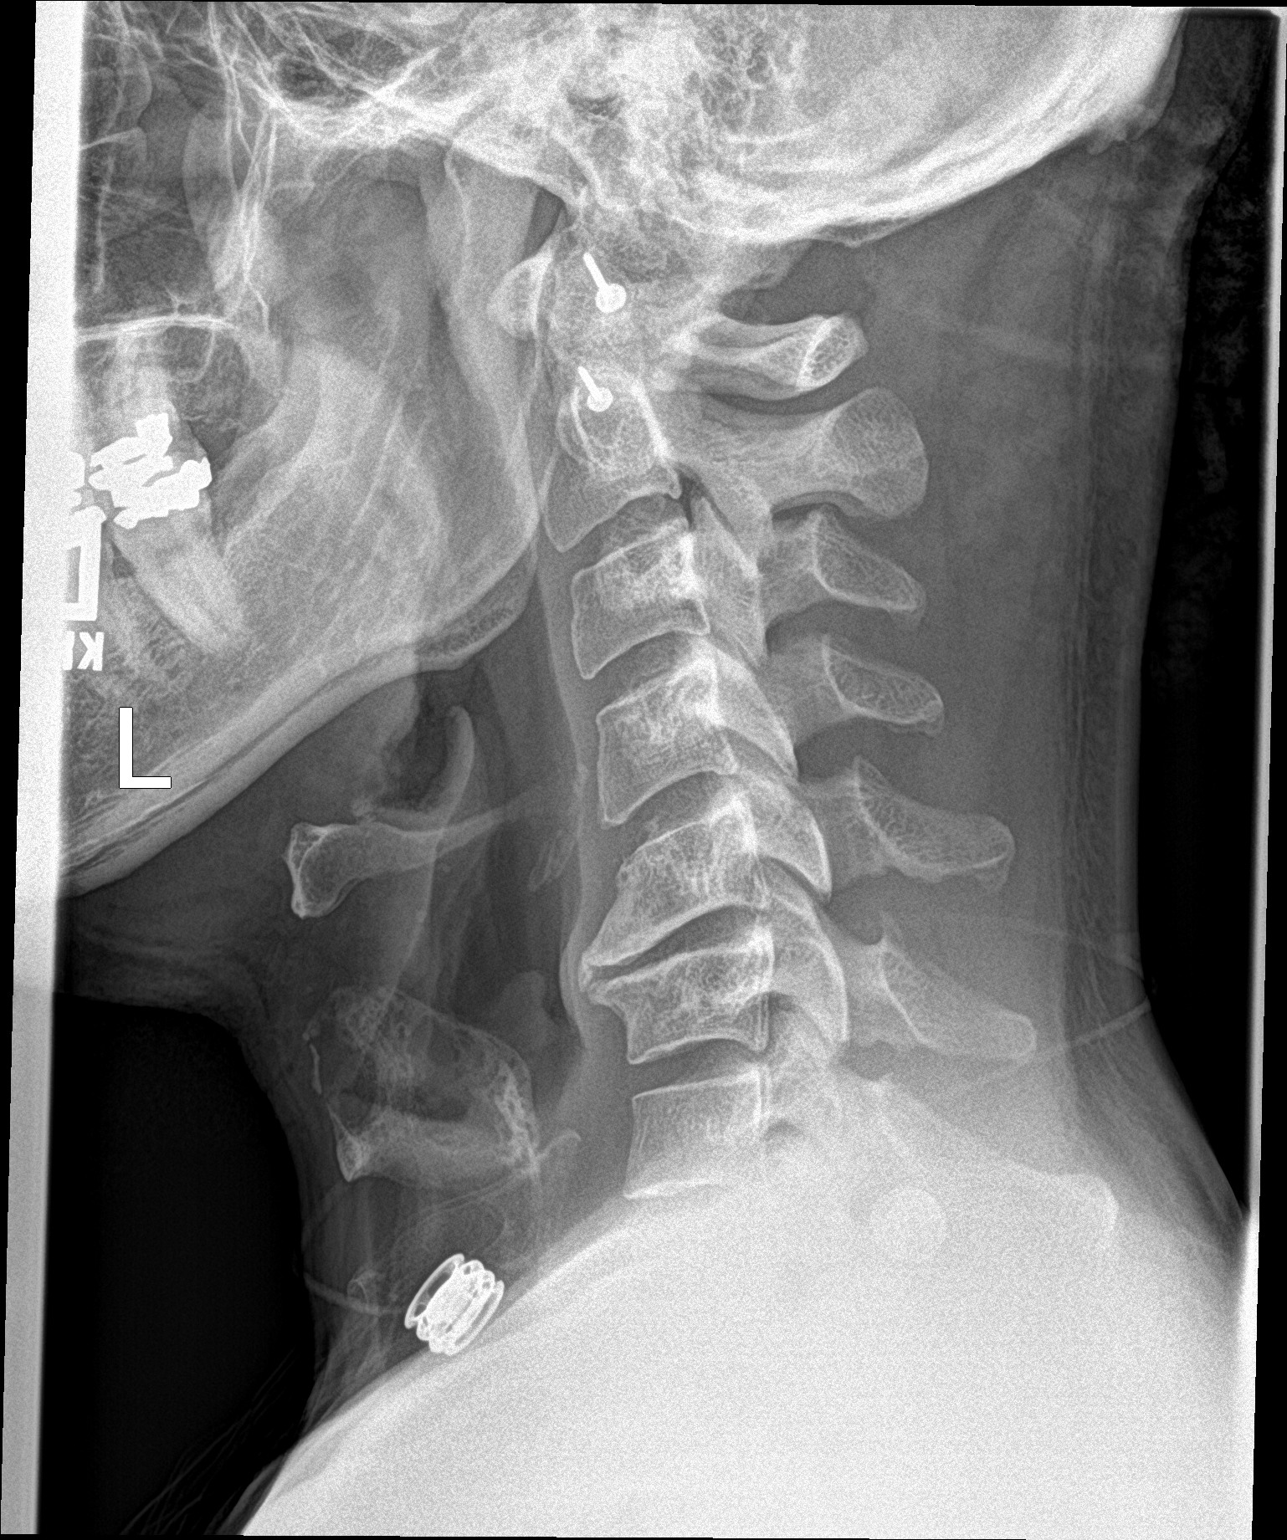

[neck ap]
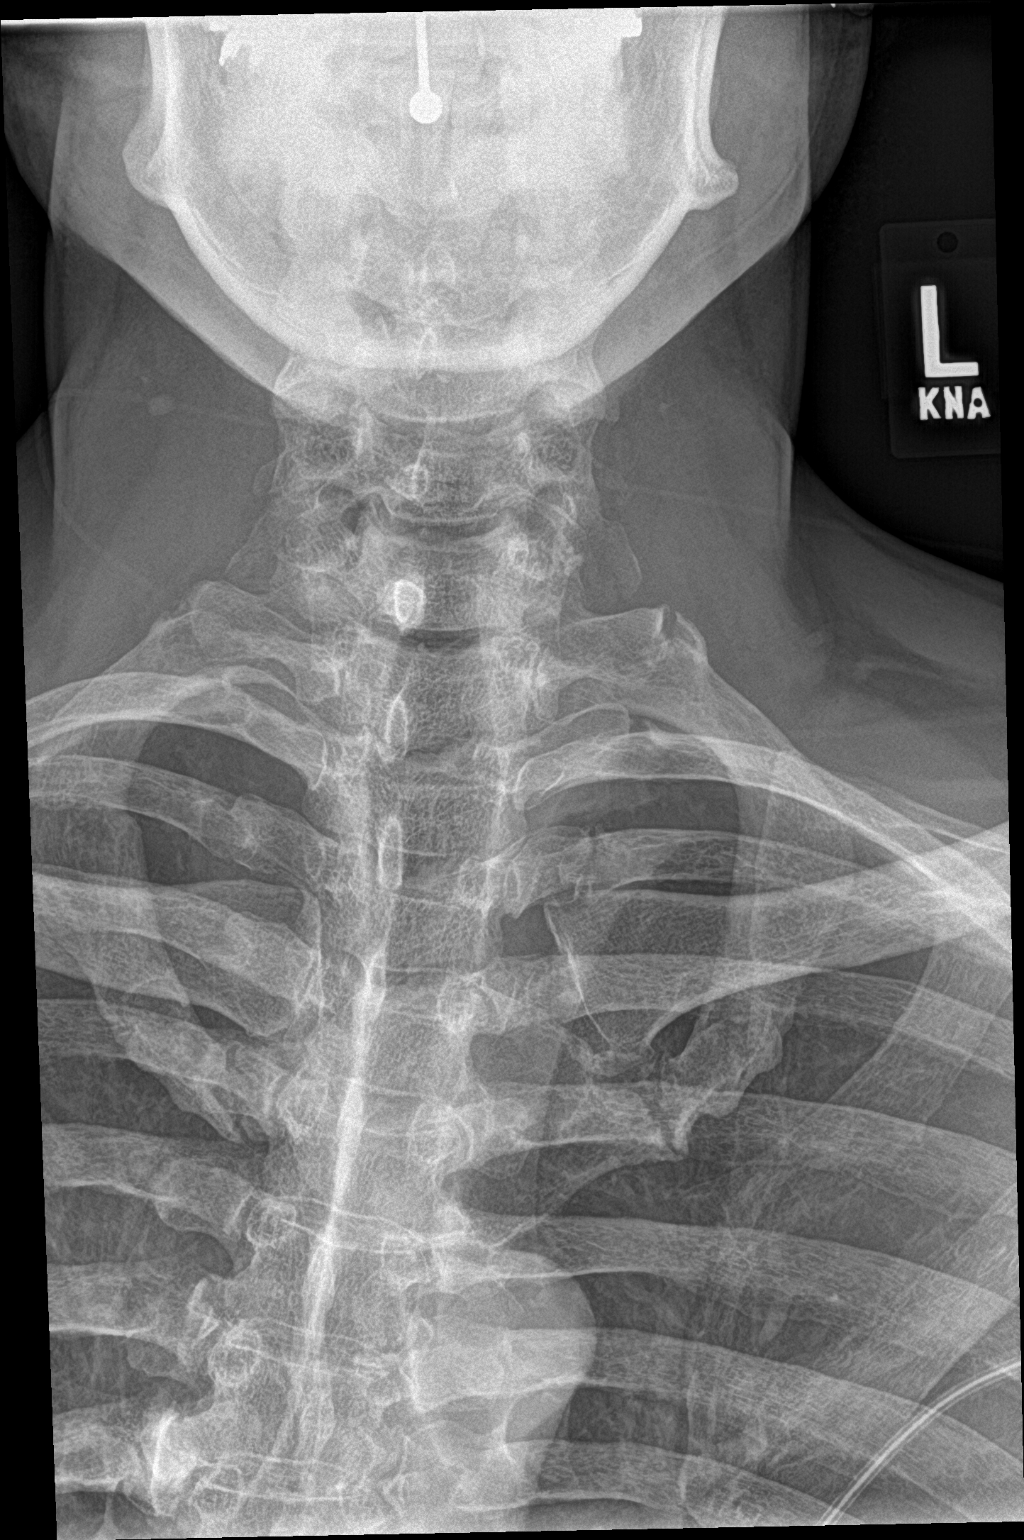

[2 of 2 positions shown; findings below may reference images not displayed]

FINDINGS: There is no evidence of retropharyngeal soft tissue swelling or
epiglottic enlargement. The cervical airway is unremarkable and no
radio-opaque foreign body identified. Degenerative changes are
prominent at C5-6.
IMPRESSION: Unremarkable soft tissues. Degenerative changes of the cervical
spine.

## 2016-05-26 MED ORDER — ALBUTEROL SULFATE (2.5 MG/3ML) 0.083% IN NEBU: 2.5000 mg | INHALATION_SOLUTION | Freq: Four times a day (QID) | RESPIRATORY_TRACT | 0 refills | Status: DC | PRN

## 2016-05-26 MED ORDER — ALBUTEROL (5 MG/ML) CONTINUOUS INHALATION SOLN
10.0000 mg/h | INHALATION_SOLUTION | Freq: Once | RESPIRATORY_TRACT | Status: AC
  Administered 2016-05-26: 10 mg/h via RESPIRATORY_TRACT
  Filled 2016-05-26: qty 20

## 2016-05-26 MED ORDER — AZITHROMYCIN 250 MG PO TABS: 250.0000 mg | ORAL_TABLET | Freq: Every day | ORAL | 0 refills | Status: DC

## 2016-05-26 MED ORDER — IBUPROFEN 400 MG PO TABS
600.0000 mg | ORAL_TABLET | Freq: Once | ORAL | Status: DC
  Filled 2016-05-26: qty 1

## 2016-05-26 MED ORDER — DEXAMETHASONE SODIUM PHOSPHATE 10 MG/ML IJ SOLN
10.0000 mg | Freq: Once | INTRAMUSCULAR | Status: AC
  Administered 2016-05-26: 10 mg via INTRAMUSCULAR
  Filled 2016-05-26: qty 1

## 2016-05-26 MED ORDER — ALBUTEROL SULFATE HFA 108 (90 BASE) MCG/ACT IN AERS: 2.0000 | INHALATION_SPRAY | Freq: Four times a day (QID) | RESPIRATORY_TRACT | 2 refills | Status: DC | PRN

## 2016-05-26 NOTE — Discharge Instructions (Addendum)
We saw you in the ER for your breathing related complains. We gave you some breathing treatments in the ER, and seems like your symptoms have improved. °Please take albuterol as needed every 4 hours. °Please take the medications prescribed. °Please refrain from smoking or smoke exposure. °Please see a primary care doctor in 1 week. °Return to the ER if your symptoms worsen. ° °

## 2016-05-26 NOTE — ED Provider Notes (Signed)
[facility] DEPT Provider Note   CSN: [id number] Arrival date & time: 05/26/16  1506  By signing my name below, I, Alexander Reeves, attest that this documentation has been prepared under the direction and in the presence of Alexander Biles, MD . Electronically Signed: Levester Reeves, Scribe. 05/26/2016. 8:35 PM.   History   Chief Complaint Chief Complaint  Patient presents with  . Shortness of Breath   Alexander Reeves is a 51 y.o. deaf male with a PMHx of asthma, who presents to the Emergency Department with complaints of generalized sickness x2 weeks. It began with a sore throat and intermittent subjective fevers, a productive cough with green/ white mucous, pain with swallowing and DOE. Sx not relieved by prednisone but temporarily improved with the use of his inhaler.  Pt denies experiencing any other acute sx at this time. No hx of lung disease.  History provided by a sign language interpretor, who is present in the room at time of examination.   The history is provided by the patient, the spouse and medical records. A language interpreter was used Soil scientist).    Past Medical History:  Diagnosis Date  . Asthma   . Deaf   . Thyroid disease     There are no active problems to display for this patient.   History reviewed. No pertinent surgical history.   Home Medications    Prior to Admission medications   Medication Sig Start Date End Date Taking? Authorizing Provider  albuterol (PROVENTIL) (2.5 MG/3ML) 0.083% nebulizer solution Take 3 mLs (2.5 mg total) by nebulization every 6 (six) hours as needed for wheezing or shortness of breath. 12/07/15   Everlene Balls, MD  azithromycin (ZITHROMAX) 250 MG tablet Take 1 tablet (250 mg total) by mouth daily. Take first 2 tablets together, then 1 every day until finished. 05/26/16   Alexander Biles, MD  predniSONE (DELTASONE) 20 MG tablet Take 3 tablets (60 mg total) by mouth daily. 12/07/15   Everlene Balls, MD     Family History History reviewed. No pertinent family history.  Social History Social History  Substance Use Topics  . Smoking status: Former Research scientist (life sciences)  . Smokeless tobacco: Never Used  . Alcohol use No     Allergies   Penicillins  Review of Systems Review of Systems  Constitutional: Positive for fever (Subjective).  HENT: Positive for sore throat.   Respiratory: Positive for cough and shortness of breath.   Gastrointestinal: Negative for nausea and vomiting.  Neurological: Negative for headaches.   Physical Exam Updated Vital Signs BP 126/88   Pulse 89   Temp 98.2 F (36.8 C) (Oral)   Resp 19   SpO2 96%   Physical Exam  Constitutional: He is oriented to person, place, and time. He appears well-developed and well-nourished. No distress.  HENT:  Head: Normocephalic and atraumatic.  Posterior pharynx with erythema. No tonsillar exudate. No tonsillar enlargement. Pt does have some cervical lymphadenopathy.  Cardiovascular: Normal rate.   Pulmonary/Chest: Effort normal.  Expiratory wheezing diffusely.  Neurological: He is alert and oriented to person, place, and time.  Skin: Skin is warm and dry.  Psychiatric: He has a normal mood and affect.  Nursing note and vitals reviewed.  ED Treatments / Results  DIAGNOSTIC STUDIES: Oxygen Saturation is 97% on RA, adequate by my interpretation.    COORDINATION OF CARE: 8:35 PM Discussed treatment plan with pt and wife at bedside. They agreed with plan.  Labs (all labs ordered are listed, but only  abnormal results are displayed) Labs Reviewed - No data to display  EKG  EKG Interpretation  Date/Time:  Monday May 26 2016 15:40:50 EDT Ventricular Rate:  107 PR Interval:  166 QRS Duration: 80 QT Interval:  334 QTC Calculation: 445 R Axis:   89 Text Interpretation:  Sinus tachycardia Otherwise normal ECG No acute changes No old tracing to compare Confirmed by Alexander Reeves [id number]) on 05/26/2016 7:29:56 PM        Radiology Dg Neck Soft Tissue  Result Date: 05/26/2016 CLINICAL DATA:  Dysphagia with sore throat. Symptoms 2 weeks ago. Headache, body ache, cough, fever. EXAM: NECK SOFT TISSUES - 1+ VIEW COMPARISON:  None. FINDINGS: There is no evidence of retropharyngeal soft tissue swelling or epiglottic enlargement. The cervical airway is unremarkable and no radio-opaque foreign body identified. Degenerative changes are prominent at C5-6. IMPRESSION: Unremarkable soft tissues. Degenerative changes of the cervical spine. Electronically Signed   By: Alexander Reeves M.D.   On: 05/26/2016 20:15   Dg Chest 2 View  Result Date: 05/26/2016 CLINICAL DATA:  Patient reports X 2 weeks he has been having trouble with his throat, 3 years ago he reports he was diagnosed with a thyroid disease, reports he was given medicine and it helped but for the last 2 weeks the medicine has not been .*comment was truncated* EXAM: CHEST  2 VIEW COMPARISON:  12/07/2015 FINDINGS: Normal cardiac silhouette. Lungs are hyperinflated. No effusion, infiltrate pneumothorax. Scoliosis noted. IMPRESSION: 1.  No acute cardiopulmonary process. 2. Hyperinflated lungs. Electronically Signed   By: Suzy Bouchard M.D.   On: 05/26/2016 16:46    Procedures Procedures (including critical care time)  Medications Ordered in ED Medications  ibuprofen (ADVIL,MOTRIN) tablet 600 mg (600 mg Oral Refused 05/26/16 1800)  dexamethasone (DECADRON) injection 10 mg (10 mg Intramuscular Given 05/26/16 1756)  albuterol (PROVENTIL,VENTOLIN) solution continuous neb (10 mg/hr Nebulization Given 05/26/16 1739)     Initial Impression / Assessment and Plan / ED Course  I have reviewed the triage vital signs and the nursing notes.  Pertinent labs & imaging results that were available during my care of the patient were reviewed by me and considered in my medical decision making (see chart for details).     Pt comes in with cc of DIB. Pt has wheezing. Pt has  thick cough. Pt has sore throat and reports that he has a feeling of difficulty breathing. Pt has no gi symptoms, no rash, BP is fine - and denies any allergic exposure - so I doubt this is anaphylaxis. CXR ordered. Pt having subjective fevers- so I think likely the symptoms are related to infection.   Final Clinical Impressions(s) / ED Diagnoses   Final diagnoses:  Atypical pneumonia    New Prescriptions New Prescriptions   AZITHROMYCIN (ZITHROMAX) 250 MG TABLET    Take 1 tablet (250 mg total) by mouth daily. Take first 2 tablets together, then 1 every day until finished.   I personally performed the services described in this documentation, which was scribed in my presence. The recorded information has been reviewed and is accurate.    Alexander Biles, MD 05/26/16 2037

## 2016-05-26 NOTE — ED Triage Notes (Signed)
Pt is deaf, communication through pen and paper in triage, interpreter has been called.Pt presents with c/o SOB. The SOB began 2 weeks ago,. He reports headaches, body aahes, cough, fevers.  He feels that his asthma is worse.

## 2016-05-26 NOTE — ED Notes (Signed)
Sign interpreter used during assessment.

## 2016-05-27 ENCOUNTER — Ambulatory Visit (INDEPENDENT_AMBULATORY_CARE_PROVIDER_SITE_OTHER): Payer: Medicare Other | Admitting: Physician Assistant

## 2016-06-02 ENCOUNTER — Encounter (INDEPENDENT_AMBULATORY_CARE_PROVIDER_SITE_OTHER): Payer: Self-pay | Admitting: Physician Assistant

## 2016-06-02 ENCOUNTER — Ambulatory Visit (INDEPENDENT_AMBULATORY_CARE_PROVIDER_SITE_OTHER): Payer: Medicare Other | Admitting: Physician Assistant

## 2016-06-02 VITALS — BP 119/82 | HR 93 | Temp 97.5°F | Ht 69.5 in | Wt 159.8 lb

## 2016-06-02 DIAGNOSIS — Z23 Encounter for immunization: Secondary | ICD-10-CM | POA: Diagnosis not present

## 2016-06-02 DIAGNOSIS — K219 Gastro-esophageal reflux disease without esophagitis: Secondary | ICD-10-CM | POA: Diagnosis not present

## 2016-06-02 DIAGNOSIS — J189 Pneumonia, unspecified organism: Secondary | ICD-10-CM

## 2016-06-02 DIAGNOSIS — R070 Pain in throat: Secondary | ICD-10-CM | POA: Diagnosis not present

## 2016-06-02 MED ORDER — PREDNISONE 20 MG PO TABS: 40.0000 mg | ORAL_TABLET | Freq: Every day | ORAL | 0 refills | Status: DC

## 2016-06-02 MED ORDER — OMEPRAZOLE 40 MG PO CPDR: 40.0000 mg | DELAYED_RELEASE_CAPSULE | Freq: Every day | ORAL | 3 refills | Status: DC

## 2016-06-02 NOTE — Progress Notes (Signed)
Subjective:  Patient ID: Alexander Reeves, male    DOB: Jul 08, 1965  Age: 51 y.o. MRN: [id number]  CC: Thyroid concerns  HPI Alexander Reeves is a 51 y.o. male with a PMH of Asthma, deafness, and "thyroid disease" presents with concern for "something stuck in throat". Onset approximately one month ago. Feels like it is hard to breath. Feels constricted or tight. Was given steroid for something similar in [place] in 2015. Pt endorses acid reflux worse with dairy products. Has not been taking antacids. Throat discomfort worse at night and when laying supine. Has to sit up for relief.    Recent ED visit on 05/26/16 and diagnosed with atypical pneumonia. Rx'ed azithromycin but has not been able to fill yet. Thinks his insurance will approve azithromycin today and will pick up abx today.  Has seen sputum production go from yellowish to clear since ED visit.  CXR was essentially normal except for hyperinflated lungs. Denies f/c/n/v.  *Encounter today facilitated by sign language interpreter.  Outpatient Medications Prior to Visit  Medication Sig Dispense Refill  . albuterol (PROVENTIL HFA;VENTOLIN HFA) 108 (90 Base) MCG/ACT inhaler Inhale 2 puffs into the lungs every 6 (six) hours as needed for wheezing or shortness of breath. 1 Inhaler 2  . albuterol (PROVENTIL) (2.5 MG/3ML) 0.083% nebulizer solution Take 3 mLs (2.5 mg total) by nebulization every 6 (six) hours as needed for wheezing or shortness of breath. 75 mL 0  . azithromycin (ZITHROMAX) 250 MG tablet Take 1 tablet (250 mg total) by mouth daily. Take first 2 tablets together, then 1 every day until finished. 6 tablet 0  . predniSONE (DELTASONE) 20 MG tablet Take 3 tablets (60 mg total) by mouth daily. (Patient not taking: Reported on 06/02/2016) 12 tablet 0   No facility-administered medications prior to visit.      ROS Review of Systems  Constitutional: Negative for chills, fever and malaise/fatigue.  HENT: Positive for sore throat.   Eyes:  Negative for blurred vision.  Respiratory: Negative for shortness of breath.   Cardiovascular: Negative for chest pain and palpitations.  Gastrointestinal: Negative for abdominal pain and nausea.  Genitourinary: Negative for dysuria and hematuria.  Musculoskeletal: Negative for joint pain and myalgias.  Skin: Negative for rash.  Neurological: Negative for tingling and headaches.  Psychiatric/Behavioral: Negative for depression. The patient is not nervous/anxious.     Objective:  BP 119/82 (BP Location: Right Arm, Patient Position: Sitting, Cuff Size: Normal)   Pulse 93   Temp 97.5 F (36.4 C) (Oral)   Ht 5' 9.5" (1.765 m)   Wt 159 lb 12.8 oz (72.5 kg)   SpO2 92%   BMI 23.26 kg/m   BP/Weight 06/02/2016 05/26/2016 26/94/8546  Systolic BP 270 350 093  Diastolic BP 82 76 73  Wt. (Lbs) 159.8 - 145  BMI 23.26 - 20.81      Physical Exam  Constitutional: He is oriented to person, place, and time.  Well developed, well nourished, NAD, polite  HENT:  Head: Normocephalic and atraumatic.  Mouth/Throat: No oropharyngeal exudate.  Tonsil 1+ bilaterally.  Eyes: No scleral icterus.  Neck: Normal range of motion. Neck supple. No thyromegaly present.  Cardiovascular: Normal rate, regular rhythm and normal heart sounds.   Pulmonary/Chest: Effort normal. He has wheezes (very mild wheezing in the lung base bilaterally.).  Abdominal: Soft. Bowel sounds are normal. There is tenderness (very mild TTP in the epigastrium).  Musculoskeletal: He exhibits no edema.  Neurological: He is alert and oriented to person, place, and time.  Coordination normal.  Deaf  Skin: Skin is warm and dry. No rash noted. No erythema. No pallor.  Psychiatric: He has a normal mood and affect. His behavior is normal. Thought content normal.  Vitals reviewed.    Assessment & Plan:   1. Gastroesophageal reflux disease, esophagitis presence not specified - Begin Omeprazole 40mg  qday - Advised to elevate the head of  the bed - Avoid foods that trigger acid reflux and indigestion  2. Throat discomfort - CBC with Differential - Comprehensive metabolic panel - Begin omeprazole (PRILOSEC) 40 MG capsule; Take 1 capsule (40 mg total) by mouth daily.  Dispense: 30 capsule; Refill: 3  3. Atypical pneumonia - Pt's expecting to pick up medication today. Advised to call here if he has not been able to pick up his medication.   Meds ordered this encounter  Medications  . omeprazole (PRILOSEC) 40 MG capsule    Sig: Take 1 capsule (40 mg total) by mouth daily.    Dispense:  30 capsule    Refill:  3    Order Specific Question:   Supervising Provider    Answer:   Tresa Garter [id number]  . predniSONE (DELTASONE) 20 MG tablet    Sig: Take 2 tablets (40 mg total) by mouth daily with breakfast.    Dispense:  14 tablet    Refill:  0    Order Specific Question:   Supervising Provider    Answer:   Tresa Garter [[id number]]    Follow-up: Return in about 4 weeks (around 06/30/2016) for f/u GERD and throat pain. Return for Prevnar.Clent Demark PA

## 2016-06-02 NOTE — Patient Instructions (Signed)
Food Choices for Gastroesophageal Reflux Disease, Adult When you have gastroesophageal reflux disease (GERD), the foods you eat and your eating habits are very important. Choosing the right foods can help ease your discomfort. What guidelines do I need to follow?  Choose fruits, vegetables, whole grains, and low-fat dairy products.  Choose low-fat meat, fish, and poultry.  Limit fats such as oils, salad dressings, butter, nuts, and avocado.  Keep a food diary. This helps you identify foods that cause symptoms.  Avoid foods that cause symptoms. These may be different for everyone.  Eat small meals often instead of 3 large meals a day.  Eat your meals slowly, in a place where you are relaxed.  Limit fried foods.  Cook foods using methods other than frying.  Avoid drinking alcohol.  Avoid drinking large amounts of liquids with your meals.  Avoid bending over or lying down until 2-3 hours after eating. What foods are not recommended? These are some foods and drinks that may make your symptoms worse: Vegetables  Tomatoes. Tomato juice. Tomato and spaghetti sauce. Chili peppers. Onion and garlic. Horseradish. Fruits  Oranges, grapefruit, and lemon (fruit and juice). Meats  High-fat meats, fish, and poultry. This includes hot dogs, ribs, ham, sausage, salami, and bacon. Dairy  Whole milk and chocolate milk. Sour cream. Cream. Butter. Ice cream. Cream cheese. Drinks  Coffee and tea. Bubbly (carbonated) drinks or energy drinks. Condiments  Hot sauce. Barbecue sauce. Sweets/Desserts  Chocolate and cocoa. Donuts. Peppermint and spearmint. Fats and Oils  High-fat foods. This includes French fries and potato chips. Other  Vinegar. Strong spices. This includes black pepper, white pepper, red pepper, cayenne, curry powder, cloves, ginger, and chili powder. The items listed above may not be a complete list of foods and drinks to avoid. Contact your dietitian for more information.    This information is not intended to replace advice given to you by your health care provider. Make sure you discuss any questions you have with your health care provider. Document Released: 07/01/2011 Document Revised: 06/07/2015 Document Reviewed: 11/03/2012 Elsevier Interactive Patient Education  2017 [employer] Inc[employer]  

## 2016-06-03 LAB — CBC WITH DIFFERENTIAL/PLATELET
Basophils Absolute: 0 10*3/uL (ref 0.0–0.2)
Basos: 0 %
EOS (ABSOLUTE): 0.3 10*3/uL (ref 0.0–0.4)
Eos: 5 %
HEMATOCRIT: 44.2 % (ref 37.5–51.0)
Hemoglobin: 14.5 g/dL (ref 13.0–17.7)
IMMATURE GRANULOCYTES: 0 %
Immature Grans (Abs): 0 10*3/uL (ref 0.0–0.1)
LYMPHS ABS: 1.7 10*3/uL (ref 0.7–3.1)
Lymphs: 34 %
MCH: 26.1 pg — ABNORMAL LOW (ref 26.6–33.0)
MCHC: 32.8 g/dL (ref 31.5–35.7)
MCV: 80 fL (ref 79–97)
Monocytes Absolute: 0.4 10*3/uL (ref 0.1–0.9)
Monocytes: 7 %
NEUTROS PCT: 54 %
Neutrophils Absolute: 2.6 10*3/uL (ref 1.4–7.0)
PLATELETS: 184 10*3/uL (ref 150–379)
RBC: 5.56 x10E6/uL (ref 4.14–5.80)
RDW: 15.5 % — AB (ref 12.3–15.4)
WBC: 5 10*3/uL (ref 3.4–10.8)

## 2016-06-03 LAB — COMPREHENSIVE METABOLIC PANEL
A/G RATIO: 1.6 (ref 1.2–2.2)
ALK PHOS: 47 IU/L (ref 39–117)
ALT: 17 IU/L (ref 0–44)
AST: 15 IU/L (ref 0–40)
Albumin: 4.5 g/dL (ref 3.5–5.5)
BUN/Creatinine Ratio: 9 (ref 9–20)
BUN: 10 mg/dL (ref 6–24)
Bilirubin Total: 0.3 mg/dL (ref 0.0–1.2)
CALCIUM: 9.6 mg/dL (ref 8.7–10.2)
CO2: 26 mmol/L (ref 18–29)
Chloride: 102 mmol/L (ref 96–106)
Creatinine, Ser: 1.08 mg/dL (ref 0.76–1.27)
GFR calc Af Amer: 93 mL/min/{1.73_m2} (ref >59)
GFR calc non Af Amer: 80 mL/min/{1.73_m2} (ref >59)
GLOBULIN, TOTAL: 2.8 g/dL (ref 1.5–4.5)
Glucose: 80 mg/dL (ref 65–99)
POTASSIUM: 4.7 mmol/L (ref 3.5–5.2)
SODIUM: 139 mmol/L (ref 134–144)
Total Protein: 7.3 g/dL (ref 6.0–8.5)

## 2016-06-06 ENCOUNTER — Other Ambulatory Visit (INDEPENDENT_AMBULATORY_CARE_PROVIDER_SITE_OTHER): Payer: Self-pay | Admitting: Physician Assistant

## 2016-06-06 DIAGNOSIS — J45909 Unspecified asthma, uncomplicated: Secondary | ICD-10-CM

## 2016-06-06 MED ORDER — ALBUTEROL SULFATE HFA 108 (90 BASE) MCG/ACT IN AERS: 1.0000 | INHALATION_SPRAY | Freq: Four times a day (QID) | RESPIRATORY_TRACT | 5 refills | Status: DC | PRN

## 2016-06-06 NOTE — Progress Notes (Signed)
Pt says Proventil not working and request rx for [employer].

## 2016-06-12 ENCOUNTER — Telehealth (INDEPENDENT_AMBULATORY_CARE_PROVIDER_SITE_OTHER): Payer: Self-pay | Admitting: Physician Assistant

## 2016-06-12 NOTE — Telephone Encounter (Signed)
Sorry but steroids should not be used regularly. Have pt return to office if there is a health concern.

## 2016-06-12 NOTE — Telephone Encounter (Signed)
FWD to PCP. Saul Fabiano S Sherlynn Tourville, CMA  

## 2016-06-12 NOTE — Telephone Encounter (Signed)
PT was called signed she left a msg about a prescription, when I called her she want a refill for predniSONE (DELTASONE) 20 MG tablet   acoording to her [facility] need another prescription form [place], please follow up

## 2016-06-12 NOTE — Telephone Encounter (Signed)
Patient stated he will keep his appointment 6/21 to discuss need for prednisone. Nat Christen, CMA

## 2016-06-14 ENCOUNTER — Encounter (HOSPITAL_COMMUNITY): Payer: Self-pay | Admitting: Emergency Medicine

## 2016-06-14 ENCOUNTER — Emergency Department (HOSPITAL_COMMUNITY)
Admission: EM | Admit: 2016-06-14 | Discharge: 2016-06-14 | Disposition: A | Discharge status: Home / Self Care | Payer: Medicare Other | Attending: Emergency Medicine | Admitting: Emergency Medicine

## 2016-06-14 ENCOUNTER — Emergency Department (HOSPITAL_COMMUNITY): Payer: Medicare Other

## 2016-06-14 DIAGNOSIS — J45901 Unspecified asthma with (acute) exacerbation: Secondary | ICD-10-CM | POA: Diagnosis not present

## 2016-06-14 DIAGNOSIS — J45909 Unspecified asthma, uncomplicated: Secondary | ICD-10-CM

## 2016-06-14 DIAGNOSIS — Z87891 Personal history of nicotine dependence: Secondary | ICD-10-CM | POA: Insufficient documentation

## 2016-06-14 DIAGNOSIS — J4521 Mild intermittent asthma with (acute) exacerbation: Secondary | ICD-10-CM

## 2016-06-14 DIAGNOSIS — R0602 Shortness of breath: Secondary | ICD-10-CM | POA: Diagnosis not present

## 2016-06-14 DIAGNOSIS — H913 Deaf nonspeaking, not elsewhere classified: Secondary | ICD-10-CM | POA: Diagnosis not present

## 2016-06-14 DIAGNOSIS — R0682 Tachypnea, not elsewhere classified: Secondary | ICD-10-CM | POA: Diagnosis not present

## 2016-06-14 LAB — CBC WITH DIFFERENTIAL/PLATELET
BASOS ABS: 0 10*3/uL (ref 0.0–0.1)
Basophils Relative: 1 %
EOS PCT: 4 %
Eosinophils Absolute: 0.2 10*3/uL (ref 0.0–0.7)
HEMATOCRIT: 42.6 % (ref 39.0–52.0)
Hemoglobin: 13.4 g/dL (ref 13.0–17.0)
LYMPHS ABS: 2.1 10*3/uL (ref 0.7–4.0)
LYMPHS PCT: 32 %
MCH: 26 pg (ref 26.0–34.0)
MCHC: 31.5 g/dL (ref 30.0–36.0)
MCV: 82.6 fL (ref 78.0–100.0)
Monocytes Absolute: 0.2 10*3/uL (ref 0.1–1.0)
Monocytes Relative: 3 %
NEUTROS ABS: 3.9 10*3/uL (ref 1.7–7.7)
Neutrophils Relative %: 60 %
PLATELETS: 165 10*3/uL (ref 150–400)
RBC: 5.16 MIL/uL (ref 4.22–5.81)
RDW: 14.8 % (ref 11.5–15.5)
WBC: 6.5 10*3/uL (ref 4.0–10.5)

## 2016-06-14 LAB — I-STAT TROPONIN, ED: TROPONIN I, POC: 0 ng/mL (ref 0.00–0.08)

## 2016-06-14 LAB — COMPREHENSIVE METABOLIC PANEL
ALK PHOS: 44 U/L (ref 38–126)
ALT: 30 U/L (ref 17–63)
AST: 26 U/L (ref 15–41)
Albumin: 3.9 g/dL (ref 3.5–5.0)
Anion gap: 8 (ref 5–15)
BILIRUBIN TOTAL: 0.7 mg/dL (ref 0.3–1.2)
BUN: 9 mg/dL (ref 6–20)
CALCIUM: 8.9 mg/dL (ref 8.9–10.3)
CHLORIDE: 103 mmol/L (ref 101–111)
CO2: 28 mmol/L (ref 22–32)
CREATININE: 1.01 mg/dL (ref 0.61–1.24)
Glucose, Bld: 114 mg/dL — ABNORMAL HIGH (ref 65–99)
Potassium: 4 mmol/L (ref 3.5–5.1)
Sodium: 139 mmol/L (ref 135–145)
TOTAL PROTEIN: 6.9 g/dL (ref 6.5–8.1)

## 2016-06-14 IMAGING — CR DG CHEST 2V
3 series · 3 of 3 positions shown · non-contrast
Comparison: Chest radiograph [DATE]

CLINICAL DATA: Shortness of breath

EXAM:
CHEST  2 VIEW

[chest lat]
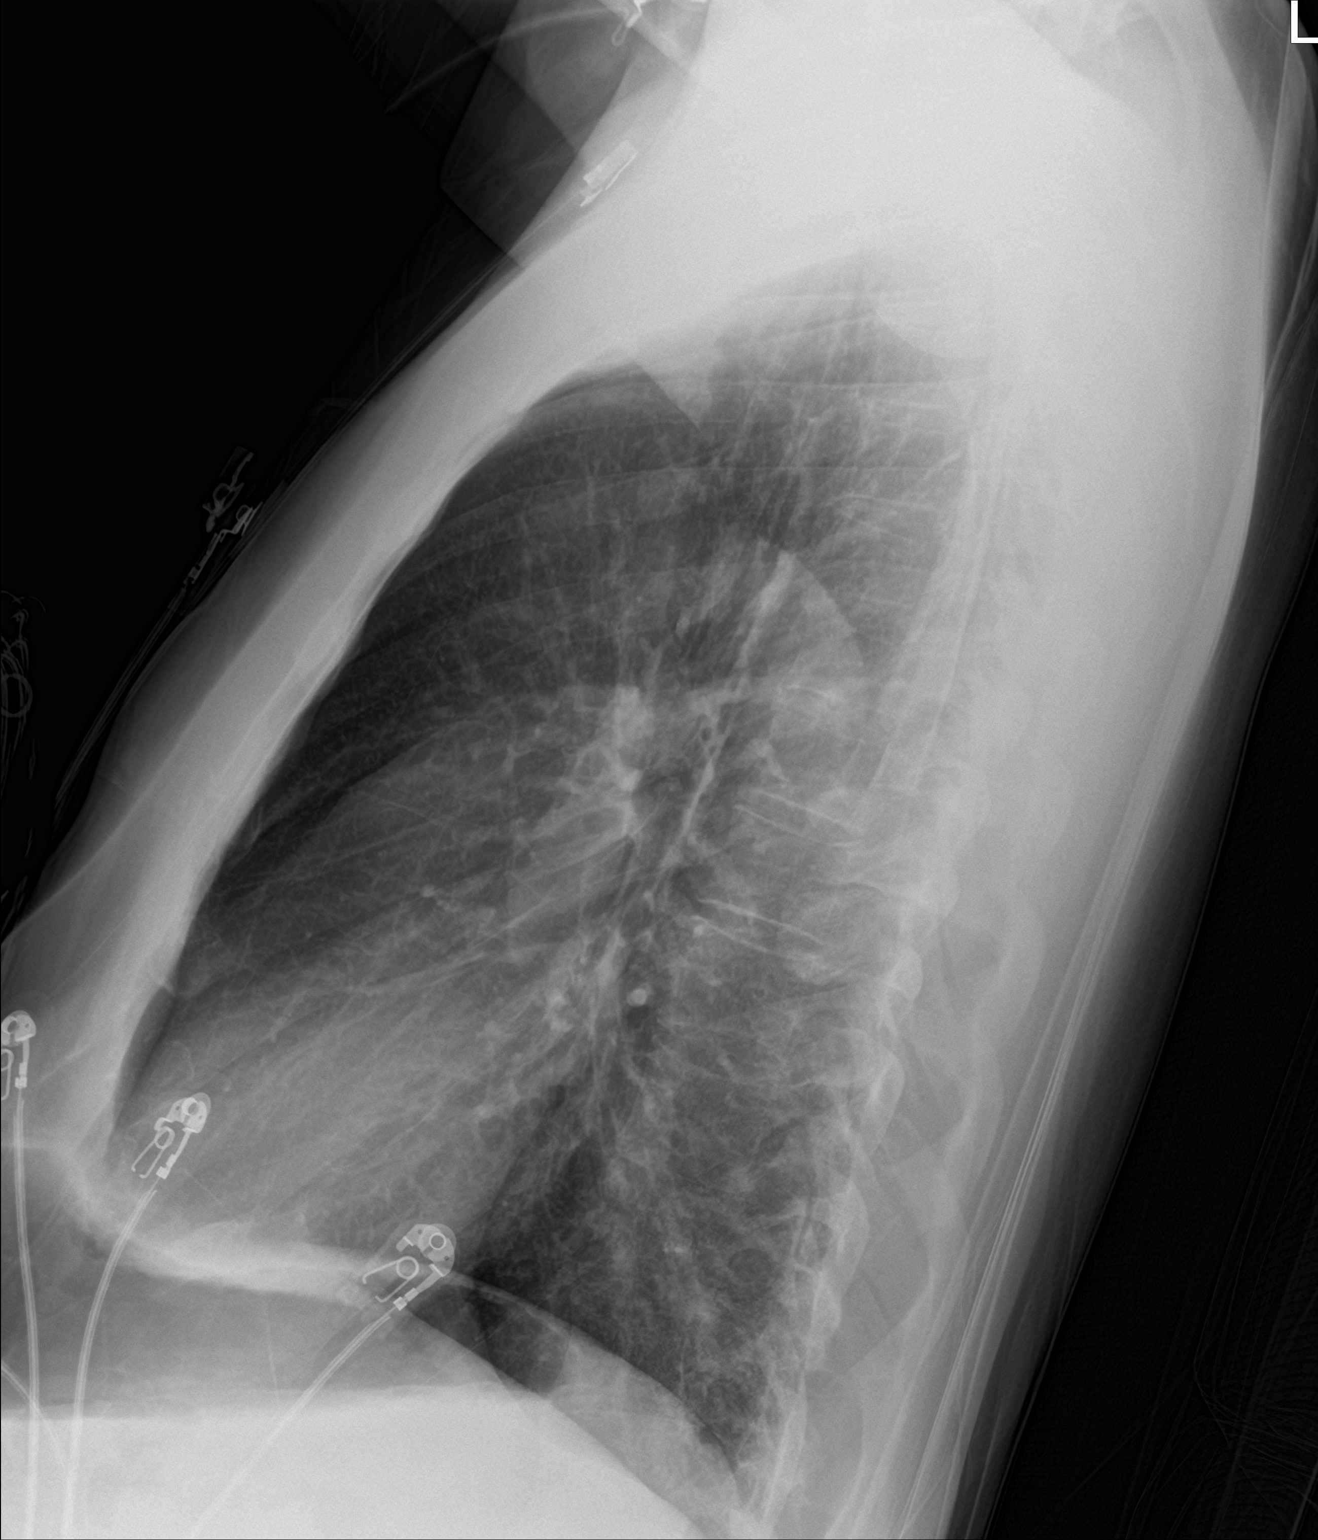

[chest ap (1 of 2)]
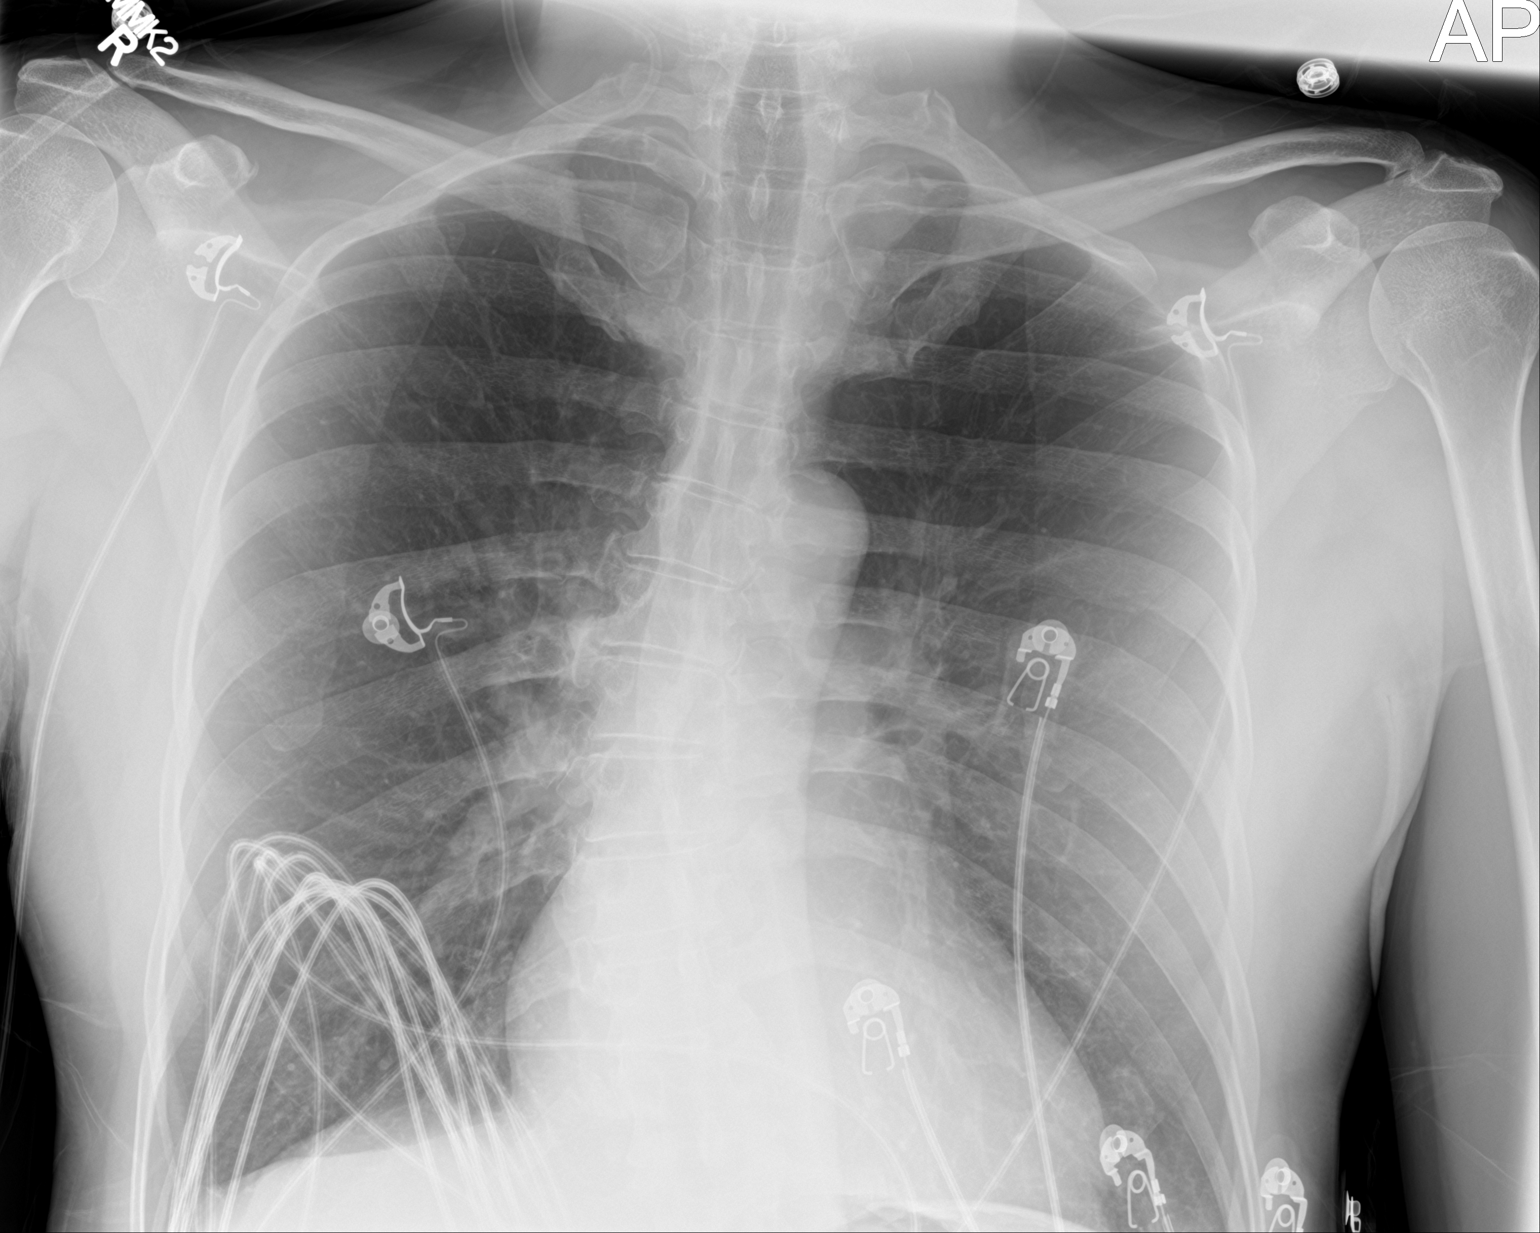

[chest ap (2 of 2)]
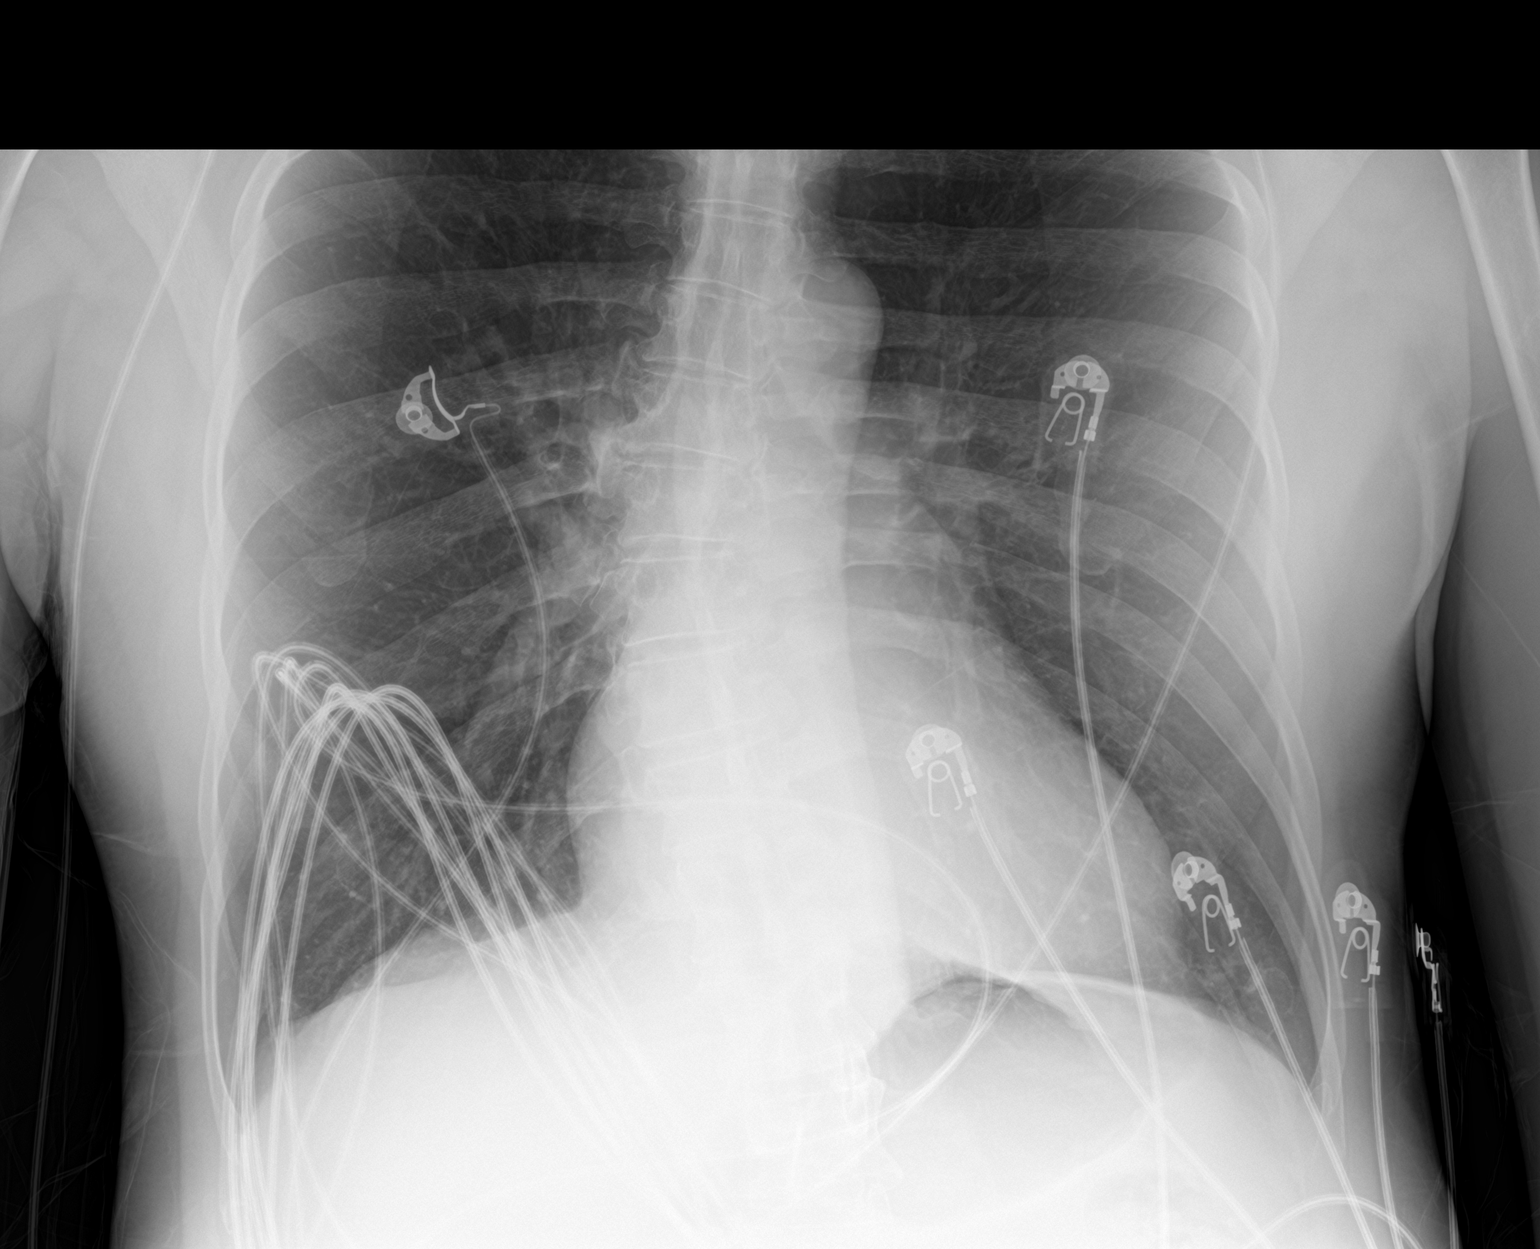

[3 of 3 positions shown; findings below may reference images not displayed]

FINDINGS: Cardiomediastinal contours are normal. The lungs are mildly
hyperinflated. No focal airspace consolidation or pulmonary edema.
No pneumothorax or pleural effusion.
IMPRESSION: Hyperinflated lungs without acute cardiopulmonary abnormality.

## 2016-06-14 MED ORDER — ALBUTEROL SULFATE (2.5 MG/3ML) 0.083% IN NEBU: 2.5000 mg | INHALATION_SOLUTION | Freq: Four times a day (QID) | RESPIRATORY_TRACT | 0 refills | Status: DC | PRN

## 2016-06-14 MED ORDER — ALBUTEROL SULFATE HFA 108 (90 BASE) MCG/ACT IN AERS: 1.0000 | INHALATION_SPRAY | Freq: Four times a day (QID) | RESPIRATORY_TRACT | 5 refills | Status: DC | PRN

## 2016-06-14 MED ORDER — PROMETHAZINE HCL 25 MG/ML IJ SOLN
12.5000 mg | Freq: Once | INTRAMUSCULAR | Status: AC
  Administered 2016-06-14: 12.5 mg via INTRAVENOUS
  Filled 2016-06-14: qty 1

## 2016-06-14 MED ORDER — SODIUM CHLORIDE 0.9 % IV BOLUS (SEPSIS)
1000.0000 mL | Freq: Once | INTRAVENOUS | Status: AC
  Administered 2016-06-14: 1000 mL via INTRAVENOUS

## 2016-06-14 MED ORDER — IPRATROPIUM BROMIDE 0.02 % IN SOLN
0.5000 mg | Freq: Once | RESPIRATORY_TRACT | Status: AC
Start: 2016-06-14 — End: 2016-06-14
  Administered 2016-06-14: 0.5 mg via RESPIRATORY_TRACT
  Filled 2016-06-14: qty 2.5

## 2016-06-14 MED ORDER — ALBUTEROL SULFATE (2.5 MG/3ML) 0.083% IN NEBU
5.0000 mg | INHALATION_SOLUTION | Freq: Once | RESPIRATORY_TRACT | Status: AC
  Administered 2016-06-14: 5 mg via RESPIRATORY_TRACT
  Filled 2016-06-14: qty 6

## 2016-06-14 MED ORDER — PREDNISONE 20 MG PO TABS: ORAL_TABLET | ORAL | 0 refills | Status: DC

## 2016-06-14 NOTE — ED Triage Notes (Signed)
Per [facility]  Pt coming form home. Respiratory distress has a hx of asthma. Just recently ran out of his nebs. Felt like his asthma was getting so he called 911. Diminished  Through out. Pt was originally tripoding. Pt wheezing throughout now but still diminished on the L.  2 albuterols and duoneb and solumedrol.  18LH.

## 2016-06-14 NOTE — Discharge Instructions (Signed)
Take prednisone as prescribed.   Use albuterol every 4 hrs as needed for wheezing.   See your doctor  Return to ER if you have worse shortness of breath, cough, wheezing.

## 2016-06-14 NOTE — ED Notes (Signed)
Family at bedside. 

## 2016-06-14 NOTE — ED Notes (Signed)
Pt complaining of a headache. Pt states he attempted to use his recuse inhaler but ran out of medication and well as his nebulizer.  Pt respirations labored.

## 2016-06-14 NOTE — ED Provider Notes (Signed)
Alexander Reeves Provider Note   CSN: [id number] Arrival date & time: 06/14/16  0344     History   Chief Complaint Chief Complaint  Patient presents with  . Shortness of Breath  . Asthma    HPI Alexander Reeves is a 51 y.o. male hx of deafness, asthma, Who presenting with cough, shortness of breath. Patient states that he has sudden onset of cough and shortness of breath and wheezing last night. Patient states that he tried to use his pump but he ran out of it. Patient then called EMS and was given 2 albuterol nebs and Solu-Medrol en route. She was seen in the ED about 2 weeks ago for asthma exacerbation but never refilled his prednisone due to lack of money.  The history is provided by the patient. A language interpreter was used.    Past Medical History:  Diagnosis Date  . Asthma   . Deaf   . Thyroid disease     There are no active problems to display for this patient.   History reviewed. No pertinent surgical history.     Home Medications    Prior to Admission medications   Medication Sig Start Date End Date Taking? Authorizing Provider  albuterol (PROAIR HFA) 108 (90 Base) MCG/ACT inhaler Inhale 1-2 puffs into the lungs every 6 (six) hours as needed for wheezing or shortness of breath. 06/06/16   Clent Demark, PA-C  azithromycin (ZITHROMAX) 250 MG tablet Take 1 tablet (250 mg total) by mouth daily. Take first 2 tablets together, then 1 every day until finished. 05/26/16   Varney Biles, MD  omeprazole (PRILOSEC) 40 MG capsule Take 1 capsule (40 mg total) by mouth daily. 06/02/16   Clent Demark, PA-C  predniSONE (DELTASONE) 20 MG tablet Take 2 tablets (40 mg total) by mouth daily with breakfast. 06/02/16   Clent Demark, PA-C    Family History No family history on file.  Social History Social History  Substance Use Topics  . Smoking status: Former Research scientist (life sciences)  . Smokeless tobacco: Never Used  . Alcohol use No     Allergies    Penicillins   Review of Systems Review of Systems  Respiratory: Positive for shortness of breath.   All other systems reviewed and are negative.    Physical Exam Updated Vital Signs BP 117/82   Pulse (!) 107   Resp (!) 22   SpO2 95%   Physical Exam  Constitutional: He is oriented to person, place, and time. He appears well-developed and well-nourished.  HENT:  Head: Normocephalic.  Mouth/Throat: Oropharynx is clear and moist.  Eyes: EOM are normal. Pupils are equal, round, and reactive to light.  Neck: Normal range of motion. Neck supple.  Cardiovascular: Normal rate, regular rhythm and normal heart sounds.   Pulmonary/Chest:  Minimal wheezing throughout. No retractions   Abdominal: Soft. Bowel sounds are normal. He exhibits no distension. There is no tenderness.  Musculoskeletal: Normal range of motion.  Neurological: He is alert and oriented to person, place, and time. No cranial nerve deficit. Coordination normal.  Skin: Skin is warm.  Psychiatric: He has a normal mood and affect.  Nursing note and vitals reviewed.    ED Treatments / Results  Labs (all labs ordered are listed, but only abnormal results are displayed) Labs Reviewed  COMPREHENSIVE METABOLIC PANEL - Abnormal; Notable for the following:       Result Value   Glucose, Bld 114 (*)    All other components within normal limits  CBC WITH DIFFERENTIAL/PLATELET  Randolm Idol, ED    EKG  EKG Interpretation  Date/Time:  Saturday June 14 2016 03:59:44 EDT Ventricular Rate:  108 PR Interval:    QRS Duration: 92 QT Interval:  348 QTC Calculation: 467 R Axis:   86 Text Interpretation:  Sinus tachycardia Artifact in lead(s) I II III aVR aVL aVF V4 V5 No significant change since last tracing Confirmed by Wandra Arthurs [phone]) on 06/14/2016 4:06:04 AM       Radiology Dg Chest 2 View  Result Date: 06/14/2016 CLINICAL DATA:  Shortness of breath EXAM: CHEST  2 VIEW COMPARISON:  Chest radiograph  05/26/2016 FINDINGS: Cardiomediastinal contours are normal. The lungs are mildly hyperinflated. No focal airspace consolidation or pulmonary edema. No pneumothorax or pleural effusion. IMPRESSION: Hyperinflated lungs without acute cardiopulmonary abnormality. Electronically Signed   By: Ulyses Jarred M.D.   On: 06/14/2016 04:22    Procedures Procedures (including critical care time)  Medications Ordered in ED Medications  sodium chloride 0.9 % bolus 1,000 mL (1,000 mLs Intravenous New Bag/Given 06/14/16 0545)  albuterol (PROVENTIL) (2.5 MG/3ML) 0.083% nebulizer solution 5 mg (5 mg Nebulization Given 06/14/16 0547)  ipratropium (ATROVENT) nebulizer solution 0.5 mg (0.5 mg Nebulization Given 06/14/16 0547)  promethazine (PHENERGAN) injection 12.5 mg (12.5 mg Intravenous Given 06/14/16 0542)     Initial Impression / Assessment and Plan / ED Course  I have reviewed the triage vital signs and the nursing notes.  Pertinent labs & imaging results that were available during my care of the patient were reviewed by me and considered in my medical decision making (see chart for details).    Alexander Reeves is a 51 y.o. male here with cough, wheezing. Recently had asthma exacerbation and seen in ED but didn't fill his meds. Likely asthma exacerbation. Will get labs, CXR. Slightly tachy but given nebs already and I doubt PE.   6:36 AM Labs and CXR unremarkable. Felt better after nebs. Will dc home with steroids, albuterol.    Final Clinical Impressions(s) / ED Diagnoses   Final diagnoses:  None    New Prescriptions New Prescriptions   No medications on file     Drenda Freeze, MD 06/14/16 [phone]

## 2016-07-03 ENCOUNTER — Ambulatory Visit (INDEPENDENT_AMBULATORY_CARE_PROVIDER_SITE_OTHER): Payer: Medicare Other | Admitting: Physician Assistant

## 2016-07-03 ENCOUNTER — Encounter (INDEPENDENT_AMBULATORY_CARE_PROVIDER_SITE_OTHER): Payer: Self-pay | Admitting: Physician Assistant

## 2016-07-03 VITALS — BP 111/71 | HR 102 | Temp 97.9°F | Resp 20 | Ht 70.0 in | Wt 153.0 lb

## 2016-07-03 DIAGNOSIS — Z114 Encounter for screening for human immunodeficiency virus [HIV]: Secondary | ICD-10-CM | POA: Diagnosis not present

## 2016-07-03 DIAGNOSIS — J45909 Unspecified asthma, uncomplicated: Secondary | ICD-10-CM

## 2016-07-03 MED ORDER — ALBUTEROL SULFATE HFA 108 (90 BASE) MCG/ACT IN AERS: 1.0000 | INHALATION_SPRAY | Freq: Four times a day (QID) | RESPIRATORY_TRACT | 5 refills | Status: DC | PRN

## 2016-07-03 MED ORDER — BUDESONIDE-FORMOTEROL FUMARATE 160-4.5 MCG/ACT IN AERO: 2.0000 | INHALATION_SPRAY | Freq: Two times a day (BID) | RESPIRATORY_TRACT | 3 refills | Status: DC

## 2016-07-03 NOTE — Progress Notes (Signed)
Subjective:  Patient ID: Alexander Reeves, male    DOB: 05/18/1965  Age: 51 y.o. MRN: [id number]  CC: sore throat  HPI Kasper Mudrick is a 51 y.o. male with a PMH of deafness and asthma. Presents on ED f/u for acute asthma exacerbation. Was given nebulizer treatment and discharged on Prednisone and Albuterol. He is currently doing well and only wants refills. Does not endorse any other symptoms or complaints.    ROS Review of Systems  Constitutional: Negative for chills, fever and malaise/fatigue.  Eyes: Negative for blurred vision.  Respiratory: Negative for shortness of breath.   Cardiovascular: Negative for chest pain and palpitations.  Gastrointestinal: Negative for abdominal pain and nausea.  Genitourinary: Negative for dysuria and hematuria.  Musculoskeletal: Negative for joint pain and myalgias.  Skin: Negative for rash.  Neurological: Negative for tingling and headaches.  Psychiatric/Behavioral: Negative for depression. The patient is not nervous/anxious.     Objective:  BP 111/71 (BP Location: Right Arm, Patient Position: Sitting, Cuff Size: Normal)   Pulse (!) 102   Temp 97.9 F (36.6 C) (Oral)   Resp 20   Ht 5\' 10"  (1.778 m)   Wt 153 lb (69.4 kg)   SpO2 95%   BMI 21.95 kg/m   BP/Weight 07/03/2016 06/14/2016 04/21/8117  Systolic BP 147 829 562  Diastolic BP 71 66 82  Wt. (Lbs) 153 - 159.8  BMI 21.95 - 23.26      Physical Exam  Constitutional: He is oriented to person, place, and time.  Well developed, well nourished, NAD, polite  HENT:  Head: Normocephalic and atraumatic.  Eyes: No scleral icterus.  Cardiovascular: Normal rate, regular rhythm and normal heart sounds.   Pulmonary/Chest: Effort normal and breath sounds normal.  Abdominal: Soft. Bowel sounds are normal.  Neurological: He is alert and oriented to person, place, and time.  Skin: Skin is warm and dry. No rash noted. No erythema. No pallor.  Psychiatric: He has a normal mood and affect. His  behavior is normal. Thought content normal.  Vitals reviewed.    Assessment & Plan:   1. Uncomplicated asthma, unspecified asthma severity, unspecified whether persistent - Begin budesonide-formoterol (SYMBICORT) 160-4.5 MCG/ACT inhaler; Inhale 2 puffs into the lungs 2 (two) times daily.  Dispense: 1 Inhaler; Refill: 3 - Refill albuterol (PROAIR HFA) 108 (90 Base) MCG/ACT inhaler; Inhale 1-2 puffs into the lungs every 6 (six) hours as needed for wheezing or shortness of breath.  Dispense: 1 Inhaler; Refill: 5  2. Encounter for screening for HIV - HIV antibody   Meds ordered this encounter  Medications  . DISCONTD: budesonide-formoterol (SYMBICORT) 160-4.5 MCG/ACT inhaler    Sig: Inhale 2 puffs into the lungs 2 (two) times daily.    Dispense:  1 Inhaler    Refill:  3    Order Specific Question:   Supervising Provider    Answer:   Tresa Garter [id number]  . DISCONTD: albuterol (PROAIR HFA) 108 (90 Base) MCG/ACT inhaler    Sig: Inhale 1-2 puffs into the lungs every 6 (six) hours as needed for wheezing or shortness of breath.    Dispense:  1 Inhaler    Refill:  5    Order Specific Question:   Supervising Provider    Answer:   Tresa Garter [id number]  . budesonide-formoterol (SYMBICORT) 160-4.5 MCG/ACT inhaler    Sig: Inhale 2 puffs into the lungs 2 (two) times daily.    Dispense:  1 Inhaler    Refill:  3  Order Specific Question:   Supervising Provider    Answer:   Tresa Garter [id number]  . albuterol (PROAIR HFA) 108 (90 Base) MCG/ACT inhaler    Sig: Inhale 1-2 puffs into the lungs every 6 (six) hours as needed for wheezing or shortness of breath.    Dispense:  1 Inhaler    Refill:  5    Order Specific Question:   Supervising Provider    Answer:   Tresa Garter [id number]    Follow-up: Return if symptoms worsen or fail to improve.   Clent Demark PA

## 2016-07-03 NOTE — Patient Instructions (Signed)

## 2016-07-04 ENCOUNTER — Other Ambulatory Visit (INDEPENDENT_AMBULATORY_CARE_PROVIDER_SITE_OTHER): Payer: Self-pay | Admitting: Physician Assistant

## 2016-07-04 DIAGNOSIS — J45909 Unspecified asthma, uncomplicated: Secondary | ICD-10-CM | POA: Insufficient documentation

## 2016-07-04 LAB — HIV ANTIBODY (ROUTINE TESTING W REFLEX): HIV Screen 4th Generation wRfx: NONREACTIVE

## 2016-07-04 MED ORDER — FLUTICASONE-SALMETEROL 250-50 MCG/DOSE IN AEPB
1.0000 | INHALATION_SPRAY | Freq: Two times a day (BID) | RESPIRATORY_TRACT | 3 refills | Status: DC
Start: 2016-07-04 — End: 2016-07-31

## 2016-07-11 ENCOUNTER — Other Ambulatory Visit (INDEPENDENT_AMBULATORY_CARE_PROVIDER_SITE_OTHER): Payer: Self-pay | Admitting: Physician Assistant

## 2016-07-11 DIAGNOSIS — J45909 Unspecified asthma, uncomplicated: Secondary | ICD-10-CM

## 2016-07-11 MED ORDER — FLUTICASONE-SALMETEROL 250-50 MCG/DOSE IN AEPB: 1.0000 | INHALATION_SPRAY | Freq: Two times a day (BID) | RESPIRATORY_TRACT | 11 refills | Status: DC | PRN

## 2016-07-11 NOTE — Progress Notes (Signed)
Change in formulary. Symbicort changed to Advair.

## 2016-07-31 ENCOUNTER — Emergency Department (HOSPITAL_COMMUNITY)
Admission: EM | Admit: 2016-07-31 | Discharge: 2016-07-31 | Disposition: A | Discharge status: Home / Self Care | Payer: Medicare Other | Attending: Emergency Medicine | Admitting: Emergency Medicine

## 2016-07-31 ENCOUNTER — Emergency Department (HOSPITAL_COMMUNITY): Payer: Medicare Other

## 2016-07-31 ENCOUNTER — Encounter (HOSPITAL_COMMUNITY): Payer: Self-pay | Admitting: Emergency Medicine

## 2016-07-31 DIAGNOSIS — J45909 Unspecified asthma, uncomplicated: Secondary | ICD-10-CM

## 2016-07-31 DIAGNOSIS — R0602 Shortness of breath: Secondary | ICD-10-CM | POA: Diagnosis not present

## 2016-07-31 DIAGNOSIS — Z79899 Other long term (current) drug therapy: Secondary | ICD-10-CM | POA: Diagnosis not present

## 2016-07-31 DIAGNOSIS — Z87891 Personal history of nicotine dependence: Secondary | ICD-10-CM | POA: Insufficient documentation

## 2016-07-31 DIAGNOSIS — J4521 Mild intermittent asthma with (acute) exacerbation: Secondary | ICD-10-CM | POA: Insufficient documentation

## 2016-07-31 DIAGNOSIS — R9431 Abnormal electrocardiogram [ECG] [EKG]: Secondary | ICD-10-CM | POA: Diagnosis not present

## 2016-07-31 HISTORY — DX: Gastro-esophageal reflux disease without esophagitis: K21.9

## 2016-07-31 HISTORY — DX: Unspecified osteoarthritis, unspecified site: M19.90

## 2016-07-31 LAB — BASIC METABOLIC PANEL
Anion gap: 6 (ref 5–15)
BUN: 13 mg/dL (ref 6–20)
CHLORIDE: 105 mmol/L (ref 101–111)
CO2: 29 mmol/L (ref 22–32)
CREATININE: 1.16 mg/dL (ref 0.61–1.24)
Calcium: 9.2 mg/dL (ref 8.9–10.3)
GFR calc non Af Amer: >60 mL/min (ref >60)
Glucose, Bld: 114 mg/dL — ABNORMAL HIGH (ref 65–99)
POTASSIUM: 4 mmol/L (ref 3.5–5.1)
SODIUM: 140 mmol/L (ref 135–145)

## 2016-07-31 LAB — CBC WITH DIFFERENTIAL/PLATELET
Basophils Absolute: 0 10*3/uL (ref 0.0–0.1)
Basophils Relative: 0 %
EOS ABS: 0.2 10*3/uL (ref 0.0–0.7)
Eosinophils Relative: 4 %
HEMATOCRIT: 40.9 % (ref 39.0–52.0)
HEMOGLOBIN: 12.8 g/dL — AB (ref 13.0–17.0)
LYMPHS ABS: 2.2 10*3/uL (ref 0.7–4.0)
Lymphocytes Relative: 42 %
MCH: 26.1 pg (ref 26.0–34.0)
MCHC: 31.3 g/dL (ref 30.0–36.0)
MCV: 83.5 fL (ref 78.0–100.0)
MONOS PCT: 7 %
Monocytes Absolute: 0.4 10*3/uL (ref 0.1–1.0)
NEUTROS ABS: 2.5 10*3/uL (ref 1.7–7.7)
NEUTROS PCT: 47 %
Platelets: 131 10*3/uL — ABNORMAL LOW (ref 150–400)
RBC: 4.9 MIL/uL (ref 4.22–5.81)
RDW: 14.6 % (ref 11.5–15.5)
WBC: 5.2 10*3/uL (ref 4.0–10.5)

## 2016-07-31 LAB — TROPONIN I

## 2016-07-31 LAB — D-DIMER, QUANTITATIVE (NOT AT ARMC)

## 2016-07-31 IMAGING — DX DG CHEST 2V
2 series · 2 of 2 positions shown · non-contrast
Comparison: Chest radiograph dated [DATE]

CLINICAL DATA: 51-year-old male with shortness of breath. History
of asthma.

EXAM:
CHEST  2 VIEW

[w chest pa]
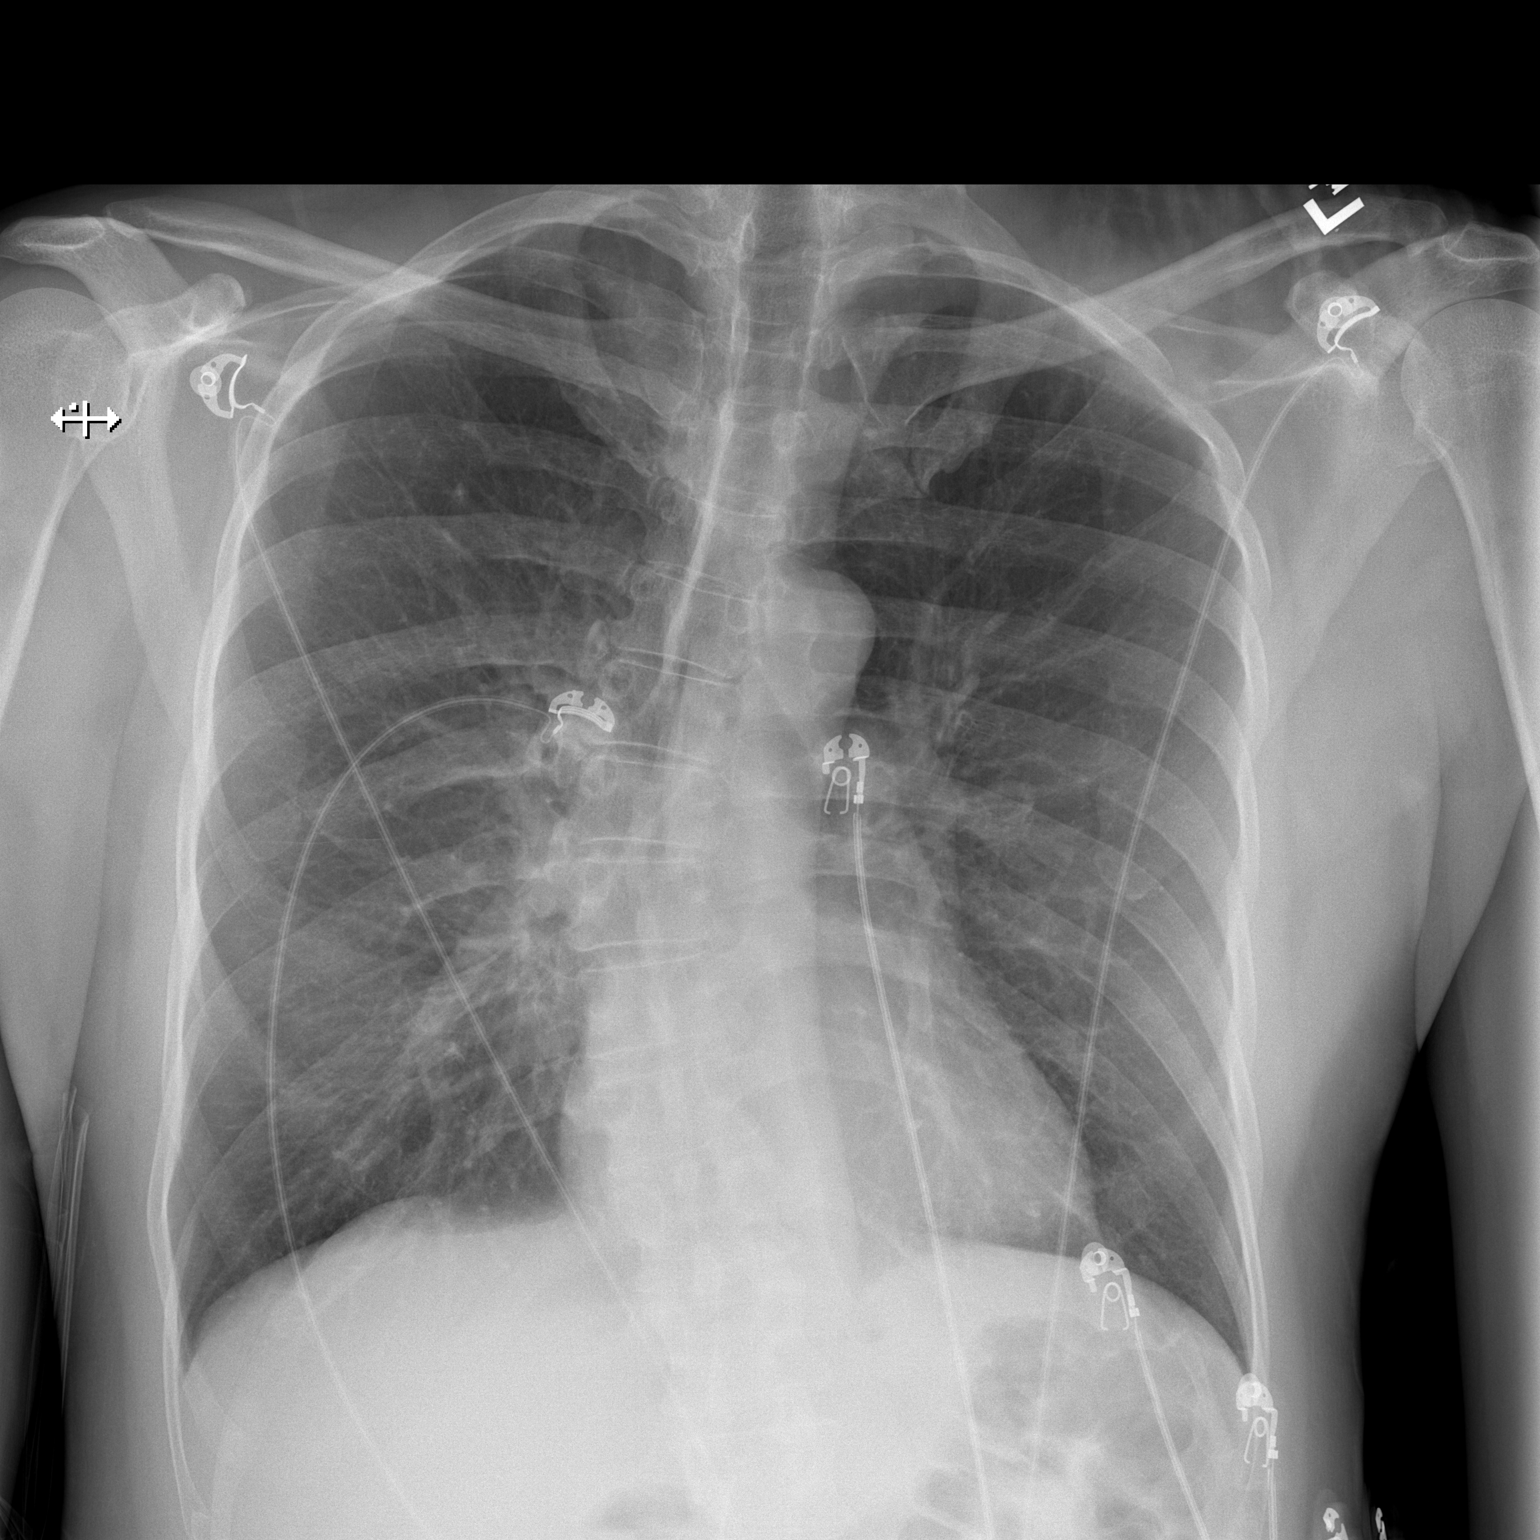

[w chest lat]
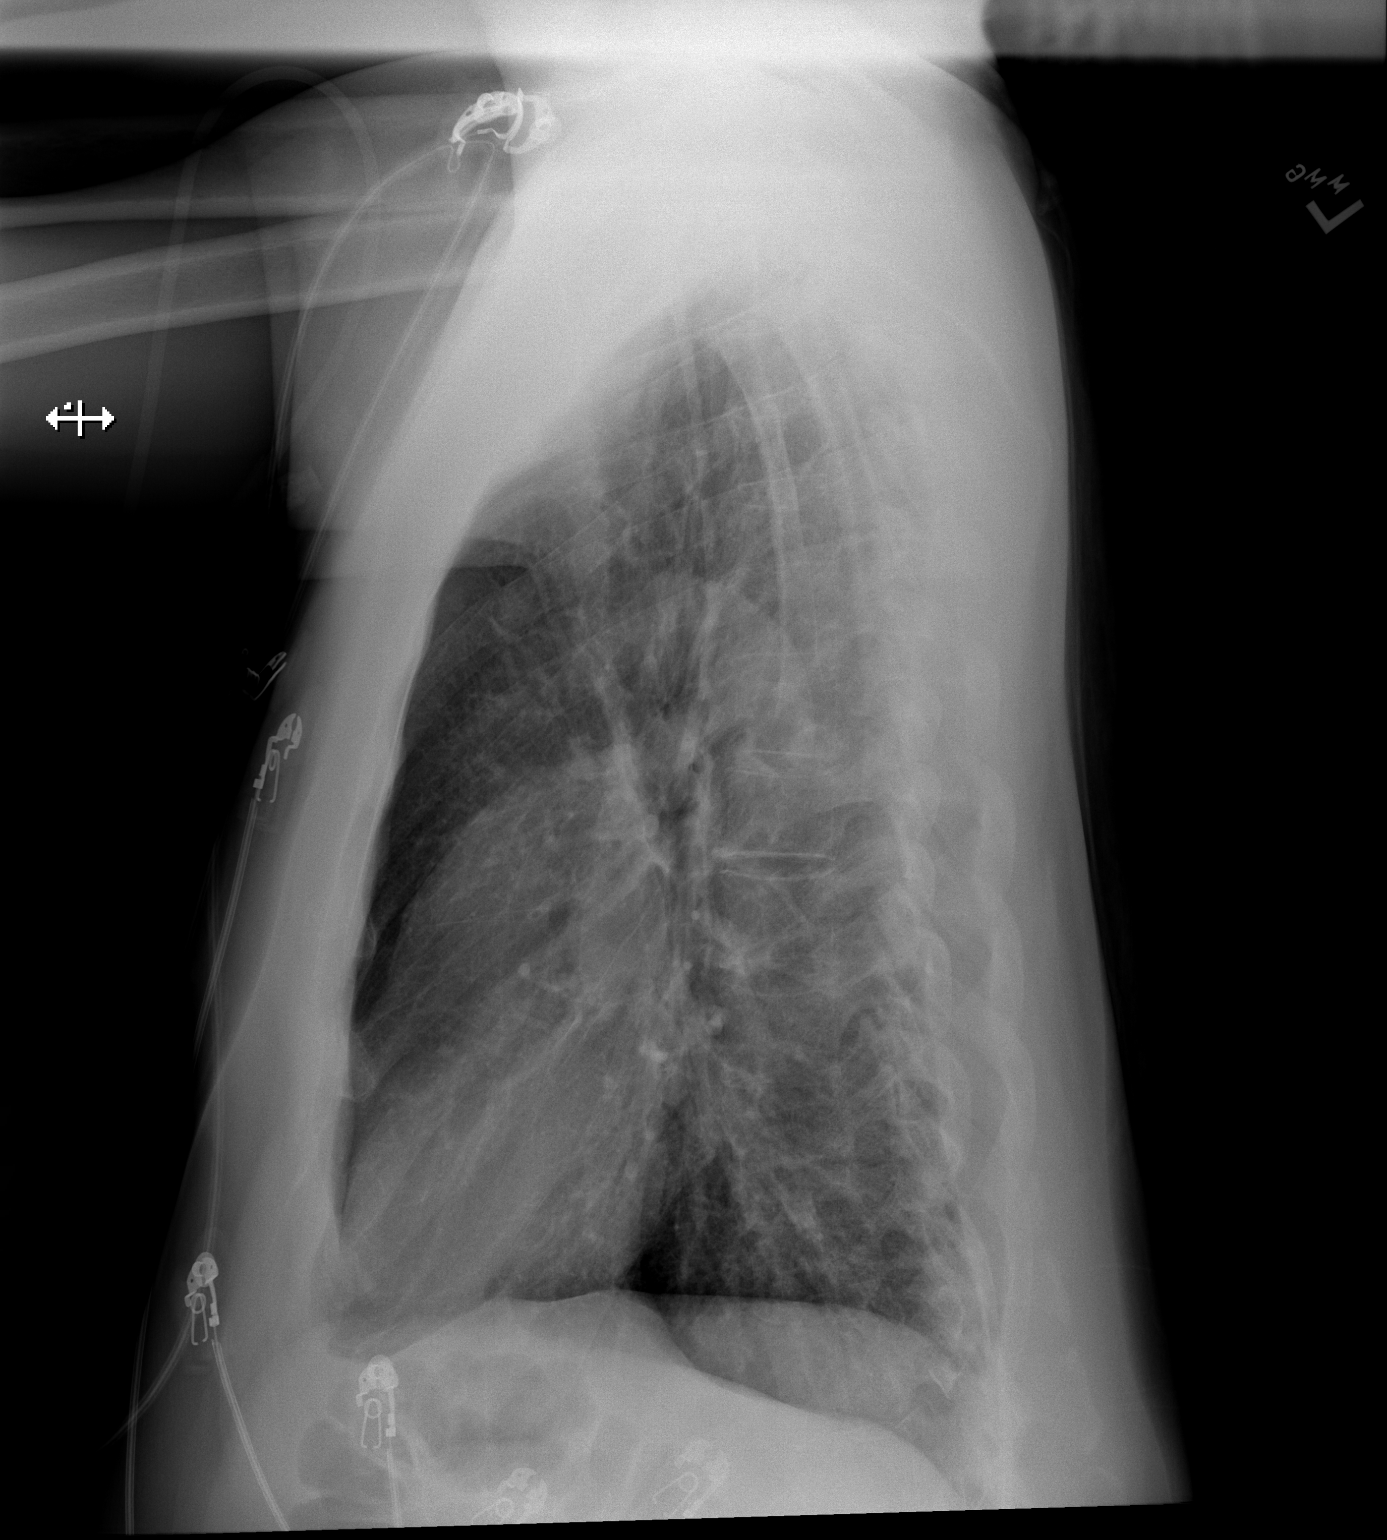

[2 of 2 positions shown; findings below may reference images not displayed]

FINDINGS: There is hyperinflation of the lungs, likely related to underlying
asthma. No focal consolidation, pleural effusion, or pneumothorax.
The cardiac silhouette is within normal limits. No acute osseous
pathology.
IMPRESSION: No active cardiopulmonary disease.

Hyperinflated lungs, likely related to asthma.

## 2016-07-31 MED ORDER — IPRATROPIUM BROMIDE 0.02 % IN SOLN
0.5000 mg | Freq: Once | RESPIRATORY_TRACT | Status: AC
  Administered 2016-07-31: 0.5 mg via RESPIRATORY_TRACT
  Filled 2016-07-31: qty 2.5

## 2016-07-31 MED ORDER — ALBUTEROL SULFATE HFA 108 (90 BASE) MCG/ACT IN AERS: 1.0000 | INHALATION_SPRAY | Freq: Four times a day (QID) | RESPIRATORY_TRACT | 0 refills | Status: DC | PRN

## 2016-07-31 MED ORDER — MAGNESIUM SULFATE 2 GM/50ML IV SOLN
2.0000 g | Freq: Once | INTRAVENOUS | Status: AC
  Administered 2016-07-31: 2 g via INTRAVENOUS
  Filled 2016-07-31: qty 50

## 2016-07-31 MED ORDER — FLUTICASONE-SALMETEROL 250-50 MCG/DOSE IN AEPB: 1.0000 | INHALATION_SPRAY | Freq: Two times a day (BID) | RESPIRATORY_TRACT | 0 refills | Status: DC

## 2016-07-31 MED ORDER — ALBUTEROL (5 MG/ML) CONTINUOUS INHALATION SOLN
10.0000 mg/h | INHALATION_SOLUTION | Freq: Once | RESPIRATORY_TRACT | Status: AC
  Administered 2016-07-31: 10 mg/h via RESPIRATORY_TRACT
  Filled 2016-07-31: qty 20

## 2016-07-31 MED ORDER — PREDNISONE 50 MG PO TABS: ORAL_TABLET | ORAL | 0 refills | Status: DC

## 2016-07-31 NOTE — ED Notes (Signed)
Pt also voices concern about insurance and costs of medications, requesting case management consult

## 2016-07-31 NOTE — ED Notes (Signed)
Ambulate pt.in hallway o2 sats 95% heatrate 110

## 2016-07-31 NOTE — ED Triage Notes (Signed)
Pt presents with [facility] for SOB with chest tightness; pt has hx of asthma, EMS reports decreased lung sounds, 10mg  albuterol, 0.5mg  atrovent, 125mg  solumedrol given enroute; pt states the chest tightness worse on left than right, feels deep within; pt deaf and speaks ASL, used video interpreter for interview

## 2016-07-31 NOTE — Discharge Instructions (Signed)
Take the steroids as prescribed. Follow up with your doctor. Use your inhaler every 4 hours as needed. Return to the ED if you develop new or worsening symptoms.

## 2016-07-31 NOTE — ED Notes (Signed)
Pt ambulated, sats remained >92% on RA

## 2016-07-31 NOTE — ED Provider Notes (Signed)
[facility] DEPT Provider Note   CSN: [id number] Arrival date & time: 07/31/16  0230  By signing my name below, I, Reola Mosher, attest that this documentation has been prepared under the direction and in the presence of Jaely Silman, Annie Main, MD. Electronically Signed: Reola Mosher, ED Scribe. 07/31/16. 2:40 AM.  History   Chief Complaint Chief Complaint  Patient presents with  . Shortness of Breath  . Chest Pain   LEVEL V CAVEAT: HPI and ROS limited due to language barrier  The history is provided by the patient, the EMS personnel and medical records. The history is limited by a language barrier. A language interpreter was used (ASL).   HPI Comments: Alexander Reeves is a 51 y.o. male BIB EMS, with a h/o asthma, deafness, who presents to the Emergency Department complaining of persistent shortness of breath with associated wheezing and chest tightness beginning yesterday, acutely worsening prior to arrival. Pt reports that over the past day he has been experiencing shortness of breath, wheezing, and chest tightness; he attempted to remedy this at home with nine albuterol treatments at home yesterday with some temporary relief of his symptoms. He additionally notes some centralized chest pain while in the ED which he rates as 8/10. He also notes an ongoing cough productive of white sputum for the past four months. Pt notes that his symptoms today are consistent with his typical asthma flare-ups. Pt does smoke marijuana, no other illicit drugs or cigarettes. He notes that his SOB/wheezing has been worse recently d/t the humidity. He denies abdominal pain, fever, or any other associated symptoms.   Past Medical History:  Diagnosis Date  . Arthritis   . Asthma   . Deaf   . GERD (gastroesophageal reflux disease)   . Thyroid disease    Patient Active Problem List   Diagnosis Date Noted  . Severe asthma 07/04/2016   History reviewed. No pertinent surgical history.  Home  Medications    Prior to Admission medications   Medication Sig Start Date End Date Taking? Authorizing Provider  albuterol (PROAIR HFA) 108 (90 Base) MCG/ACT inhaler Inhale 1-2 puffs into the lungs every 6 (six) hours as needed for wheezing or shortness of breath. 07/03/16   Clent Demark, PA-C  Fluticasone-Salmeterol (ADVAIR DISKUS) 250-50 MCG/DOSE AEPB Inhale 1 puff into the lungs 2 (two) times daily. 07/04/16   Clent Demark, PA-C  Fluticasone-Salmeterol (ADVAIR DISKUS) 250-50 MCG/DOSE AEPB Inhale 1 puff into the lungs 2 (two) times daily as needed. 07/11/16   Clent Demark, PA-C  omeprazole (PRILOSEC) 40 MG capsule Take 1 capsule (40 mg total) by mouth daily. 06/02/16   Clent Demark, PA-C   Family History History reviewed. No pertinent family history.  Social History Social History  Substance Use Topics  . Smoking status: Former Research scientist (life sciences)  . Smokeless tobacco: Never Used  . Alcohol use No   Allergies   Penicillins  Review of Systems Review of Systems  Unable to perform ROS: Other (language barrier)   Physical Exam Updated Vital Signs BP (!) 144/110   Pulse (!) 104   Temp 97.9 F (36.6 C) (Oral)   Resp (!) 21   Ht 5\' 10"  (1.778 m)   Wt 165 lb (74.8 kg)   SpO2 95%   BMI 23.68 kg/m   Physical Exam  Constitutional: He is oriented to person, place, and time. He appears well-developed and well-nourished. No distress.  No increased work of breathing  HENT:  Head: Normocephalic and atraumatic.  Mouth/Throat: Oropharynx is clear and moist. No oropharyngeal exudate.  Eyes: Pupils are equal, round, and reactive to light. Conjunctivae and EOM are normal.  Neck: Normal range of motion. Neck supple.  No meningismus.  Cardiovascular: Normal rate, regular rhythm, normal heart sounds and intact distal pulses.   No murmur heard. Pulmonary/Chest: Effort normal. No respiratory distress. He has wheezes.  No distress. Moderate air exchange with faint expiratory  wheezing.   Abdominal: Soft. There is no tenderness. There is no rebound and no guarding.  Musculoskeletal: Normal range of motion. He exhibits no edema or tenderness.  Neurological: He is alert and oriented to person, place, and time. No cranial nerve deficit. He exhibits normal muscle tone. Coordination normal.   5/5 strength throughout. CN 2-12 intact.Equal grip strength.   Skin: Skin is warm.  Psychiatric: He has a normal mood and affect. His behavior is normal.  Nursing note and vitals reviewed.  ED Treatments / Results  DIAGNOSTIC STUDIES: Oxygen Saturation is 95% on RA, adequate by my interpretation.   COORDINATION OF CARE: 2:37 AM-Discussed next steps with pt.   Labs (all labs ordered are listed, but only abnormal results are displayed) Labs Reviewed  CBC WITH DIFFERENTIAL/PLATELET - Abnormal; Notable for the following:       Result Value   Hemoglobin 12.8 (*)    Platelets 131 (*)    All other components within normal limits  BASIC METABOLIC PANEL - Abnormal; Notable for the following:    Glucose, Bld 114 (*)    All other components within normal limits  TROPONIN I  D-DIMER, QUANTITATIVE (NOT AT [facility])    EKG  EKG Interpretation  Date/Time:  Thursday July 31 2016 02:46:50 EDT Ventricular Rate:  99 PR Interval:    QRS Duration: 85 QT Interval:  359 QTC Calculation: 461 R Axis:   83 Text Interpretation:  Sinus rhythm Borderline ST elevation, inferior leads No significant change was found Confirmed by Ezequiel Essex [id number]) on 07/31/2016 2:51:50 AM Also confirmed by Ezequiel Essex [phone]), editor Oswaldo Milian, Beverly ([id number])  on 07/31/2016 7:56:49 AM      Radiology Dg Chest 2 View  Result Date: 07/31/2016 CLINICAL DATA:  51 year old male with shortness of breath. History of asthma. EXAM: CHEST  2 VIEW COMPARISON:  Chest radiograph dated 06/14/2016 FINDINGS: There is hyperinflation of the lungs, likely related to underlying asthma. No focal consolidation, pleural  effusion, or pneumothorax. The cardiac silhouette is within normal limits. No acute osseous pathology. IMPRESSION: No active cardiopulmonary disease. Hyperinflated lungs, likely related to asthma. Electronically Signed   By: Anner Crete M.D.   On: 07/31/2016 04:39    Procedures Procedures   Medications Ordered in ED Medications - No data to display  Initial Impression / Assessment and Plan / ED Course  I have reviewed the triage vital signs and the nursing notes.  Pertinent labs & imaging results that were available during my care of the patient were reviewed by me and considered in my medical decision making (see chart for details).    Patient with SOB and coughing and wheezing x1 day, history of asthma.  Chest tightness as well.   Decreased air exchange with wheezing. Nebs, steroids, magnesium CXR negative. EKG nsr.   Troponin and D-dimer negative. Breathing improved after continuous nebs. Patient able to ambulate without desaturation. Good air exchange.  Will treat for asthma exacerbation. Doubt ACS, doubt PE. Refill albuterol and advair.  Follow up with PCP. Return precautions discussed.  Final Clinical Impressions(s) / ED Diagnoses  Final diagnoses:  Mild intermittent asthma with exacerbation   New Prescriptions New Prescriptions   No medications on file  I personally performed the services described in this documentation, which was scribed in my presence. The recorded information has been reviewed and is accurate.    Ezequiel Essex, MD 07/31/16 2121

## 2016-08-06 ENCOUNTER — Encounter (HOSPITAL_COMMUNITY): Payer: Self-pay | Admitting: Emergency Medicine

## 2016-08-06 ENCOUNTER — Emergency Department (HOSPITAL_COMMUNITY): Payer: Medicare Other

## 2016-08-06 DIAGNOSIS — Z79899 Other long term (current) drug therapy: Secondary | ICD-10-CM | POA: Diagnosis not present

## 2016-08-06 DIAGNOSIS — Z87891 Personal history of nicotine dependence: Secondary | ICD-10-CM | POA: Insufficient documentation

## 2016-08-06 DIAGNOSIS — R0602 Shortness of breath: Secondary | ICD-10-CM | POA: Insufficient documentation

## 2016-08-06 DIAGNOSIS — Z880 Allergy status to penicillin: Secondary | ICD-10-CM | POA: Diagnosis not present

## 2016-08-06 DIAGNOSIS — J4521 Mild intermittent asthma with (acute) exacerbation: Secondary | ICD-10-CM | POA: Insufficient documentation

## 2016-08-06 DIAGNOSIS — R0600 Dyspnea, unspecified: Secondary | ICD-10-CM | POA: Diagnosis not present

## 2016-08-06 DIAGNOSIS — R070 Pain in throat: Secondary | ICD-10-CM | POA: Diagnosis not present

## 2016-08-06 LAB — I-STAT TROPONIN, ED: Troponin i, poc: 0 ng/mL (ref 0.00–0.08)

## 2016-08-06 IMAGING — CR DG CHEST 2V
2 series · 2 of 2 positions shown · non-contrast
Comparison: [DATE]

CLINICAL DATA: Dyspnea tonight

EXAM:
CHEST  2 VIEW

[chest pa]
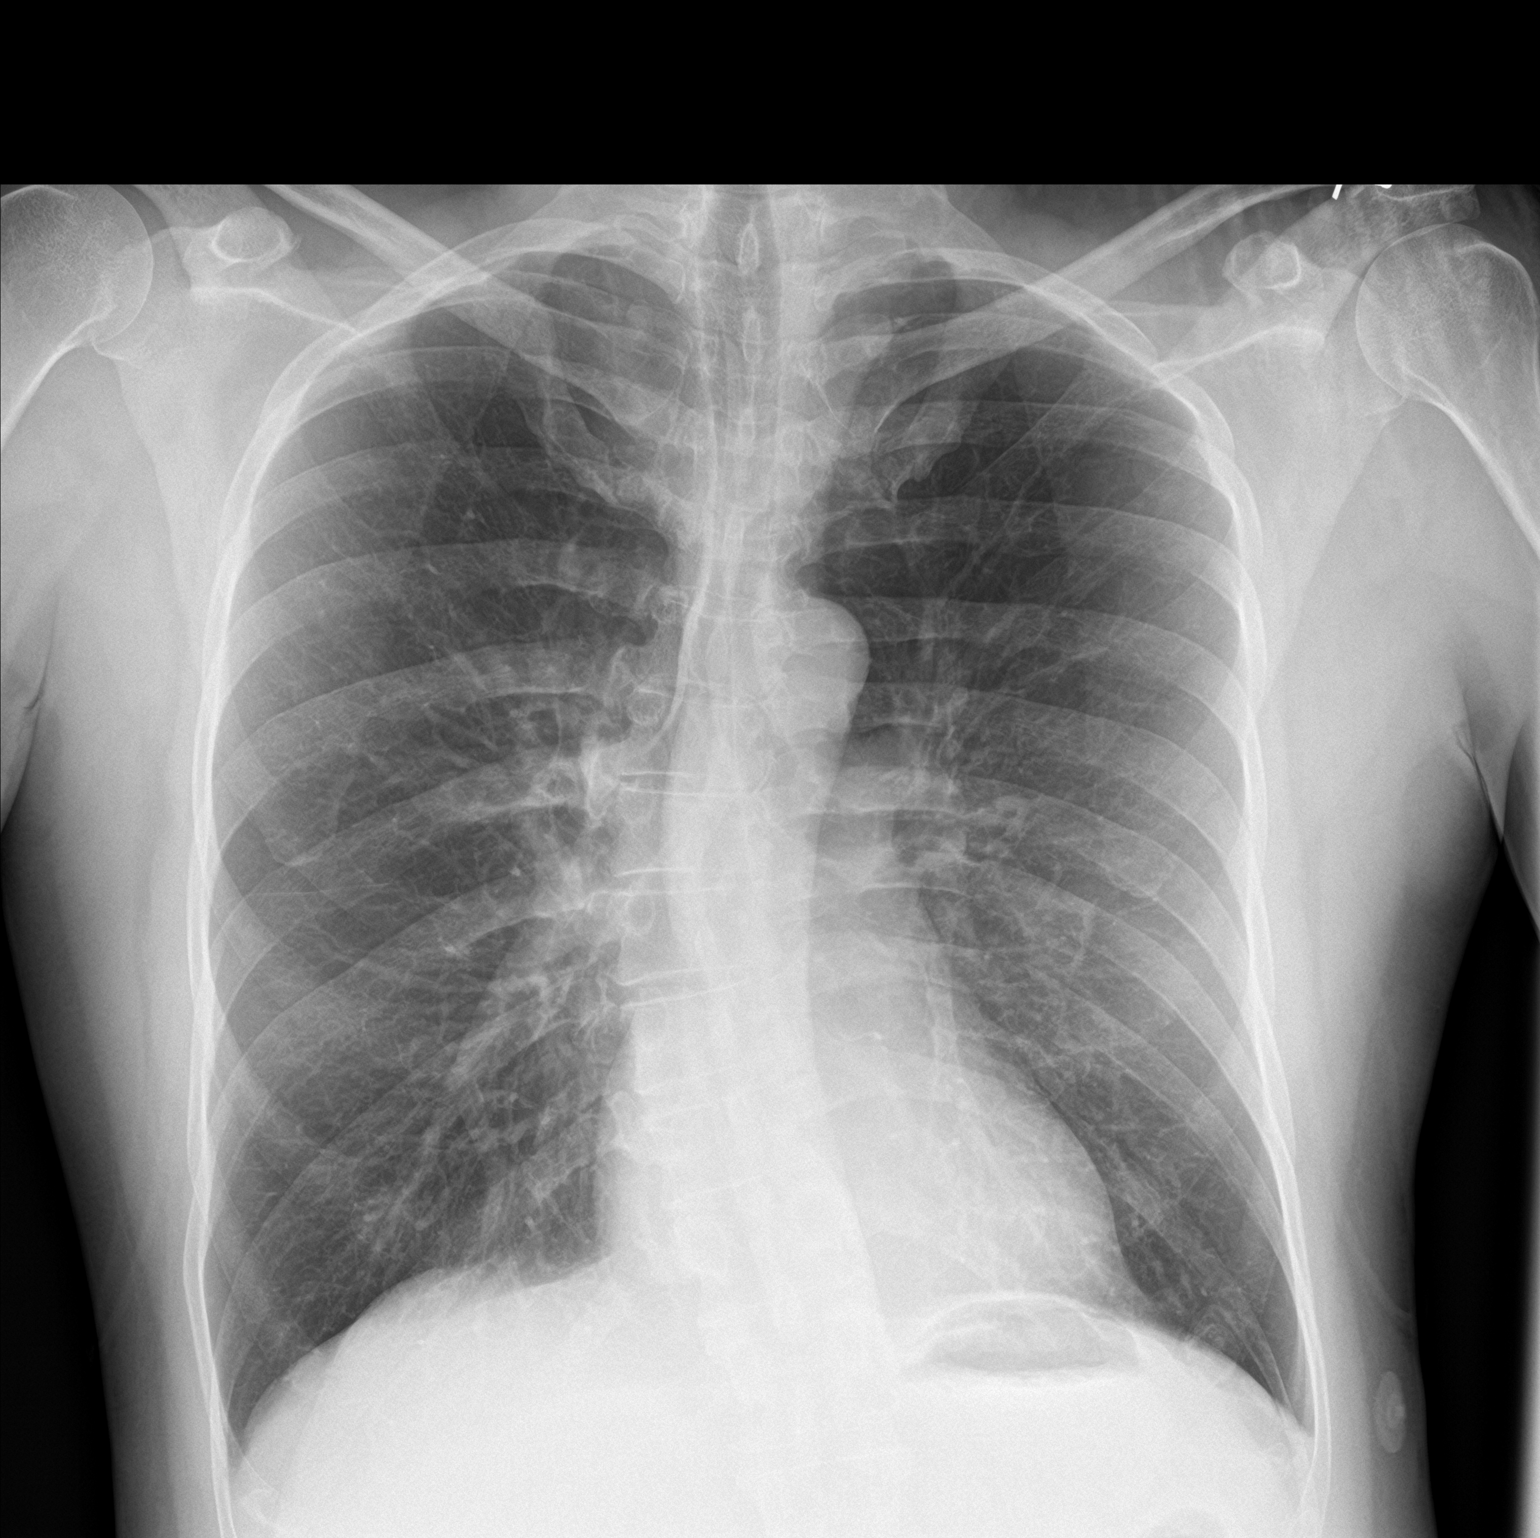

[chest lat]
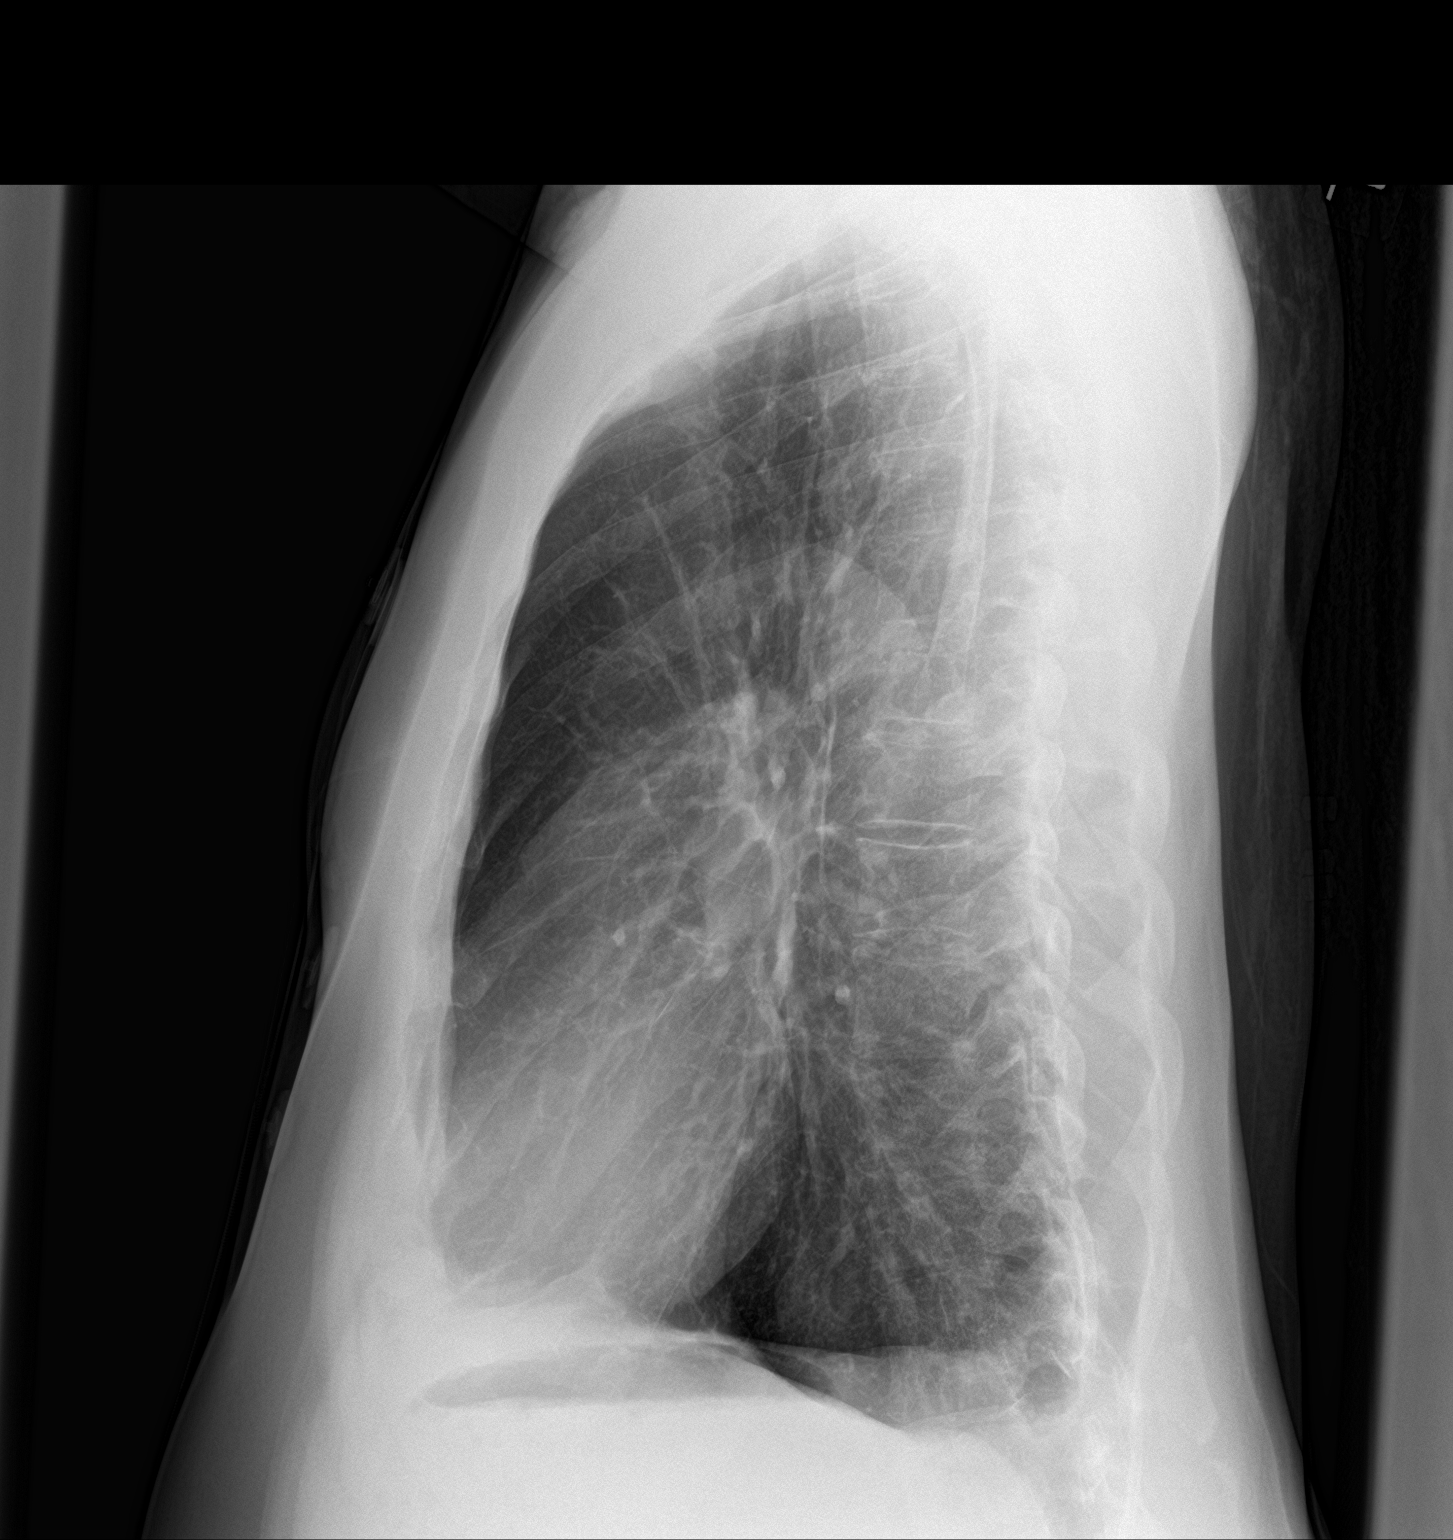

[2 of 2 positions shown; findings below may reference images not displayed]

FINDINGS: The lungs are clear. The pulmonary vasculature is normal. Heart size
is normal. Hilar and mediastinal contours are unremarkable. There is
no pleural effusion.
IMPRESSION: No active cardiopulmonary disease.

## 2016-08-06 MED ORDER — ALBUTEROL SULFATE (2.5 MG/3ML) 0.083% IN NEBU
INHALATION_SOLUTION | RESPIRATORY_TRACT | Status: AC
  Administered 2016-08-06: 5 mg via RESPIRATORY_TRACT
  Filled 2016-08-06: qty 6

## 2016-08-06 MED ORDER — ALBUTEROL SULFATE (2.5 MG/3ML) 0.083% IN NEBU
5.0000 mg | INHALATION_SOLUTION | Freq: Once | RESPIRATORY_TRACT | Status: AC
  Administered 2016-08-06: 5 mg via RESPIRATORY_TRACT

## 2016-08-06 NOTE — ED Triage Notes (Signed)
C/o SOB/asthma flare-up this evening.  Nebulizer machine is broken.  Used inhaler without relief.  Also reports burning pain in throat.  History of GERD.

## 2016-08-07 ENCOUNTER — Other Ambulatory Visit (INDEPENDENT_AMBULATORY_CARE_PROVIDER_SITE_OTHER): Payer: Self-pay | Admitting: Physician Assistant

## 2016-08-07 ENCOUNTER — Emergency Department (HOSPITAL_COMMUNITY)
Admission: EM | Admit: 2016-08-07 | Discharge: 2016-08-07 | Disposition: A | Discharge status: Home / Self Care | Payer: Medicare Other | Attending: Emergency Medicine | Admitting: Emergency Medicine

## 2016-08-07 ENCOUNTER — Telehealth (INDEPENDENT_AMBULATORY_CARE_PROVIDER_SITE_OTHER): Payer: Self-pay | Admitting: Physician Assistant

## 2016-08-07 DIAGNOSIS — H9190 Unspecified hearing loss, unspecified ear: Secondary | ICD-10-CM | POA: Diagnosis not present

## 2016-08-07 DIAGNOSIS — J4521 Mild intermittent asthma with (acute) exacerbation: Secondary | ICD-10-CM

## 2016-08-07 DIAGNOSIS — J45901 Unspecified asthma with (acute) exacerbation: Secondary | ICD-10-CM | POA: Diagnosis not present

## 2016-08-07 DIAGNOSIS — R0902 Hypoxemia: Secondary | ICD-10-CM | POA: Diagnosis not present

## 2016-08-07 DIAGNOSIS — K219 Gastro-esophageal reflux disease without esophagitis: Secondary | ICD-10-CM | POA: Diagnosis not present

## 2016-08-07 DIAGNOSIS — Z87891 Personal history of nicotine dependence: Secondary | ICD-10-CM | POA: Diagnosis not present

## 2016-08-07 DIAGNOSIS — Z7951 Long term (current) use of inhaled steroids: Secondary | ICD-10-CM | POA: Diagnosis not present

## 2016-08-07 DIAGNOSIS — J45909 Unspecified asthma, uncomplicated: Secondary | ICD-10-CM

## 2016-08-07 DIAGNOSIS — Z79899 Other long term (current) drug therapy: Secondary | ICD-10-CM | POA: Diagnosis not present

## 2016-08-07 LAB — BASIC METABOLIC PANEL
ANION GAP: 7 (ref 5–15)
BUN: 9 mg/dL (ref 6–20)
CALCIUM: 9 mg/dL (ref 8.9–10.3)
CO2: 27 mmol/L (ref 22–32)
CREATININE: 1.12 mg/dL (ref 0.61–1.24)
Chloride: 107 mmol/L (ref 101–111)
GFR calc Af Amer: >60 mL/min (ref >60)
GFR calc non Af Amer: >60 mL/min (ref >60)
GLUCOSE: 99 mg/dL (ref 65–99)
Potassium: 3.9 mmol/L (ref 3.5–5.1)
Sodium: 141 mmol/L (ref 135–145)

## 2016-08-07 LAB — CBC
HCT: 42.9 % (ref 39.0–52.0)
HEMOGLOBIN: 13.5 g/dL (ref 13.0–17.0)
MCH: 25.9 pg — AB (ref 26.0–34.0)
MCHC: 31.5 g/dL (ref 30.0–36.0)
MCV: 82.2 fL (ref 78.0–100.0)
Platelets: 159 10*3/uL (ref 150–400)
RBC: 5.22 MIL/uL (ref 4.22–5.81)
RDW: 14.8 % (ref 11.5–15.5)
WBC: 5 10*3/uL (ref 4.0–10.5)

## 2016-08-07 MED ORDER — IPRATROPIUM-ALBUTEROL 0.5-2.5 (3) MG/3ML IN SOLN
3.0000 mL | Freq: Once | RESPIRATORY_TRACT | Status: AC
  Administered 2016-08-07: 3 mL via RESPIRATORY_TRACT
  Filled 2016-08-07: qty 3

## 2016-08-07 MED ORDER — IPRATROPIUM-ALBUTEROL 0.5-2.5 (3) MG/3ML IN SOLN: 3.0000 mL | Freq: Four times a day (QID) | RESPIRATORY_TRACT | 5 refills | Status: DC | PRN

## 2016-08-07 MED ORDER — ALBUTEROL SULFATE HFA 108 (90 BASE) MCG/ACT IN AERS
2.0000 | INHALATION_SPRAY | RESPIRATORY_TRACT | Status: DC | PRN
  Administered 2016-08-07: 2 via RESPIRATORY_TRACT
  Filled 2016-08-07: qty 6.7

## 2016-08-07 MED ORDER — PREDNISONE 20 MG PO TABS: 40.0000 mg | ORAL_TABLET | Freq: Every day | ORAL | 0 refills | Status: DC

## 2016-08-07 MED ORDER — PREDNISONE 20 MG PO TABS
60.0000 mg | ORAL_TABLET | Freq: Once | ORAL | Status: AC
  Administered 2016-08-07: 60 mg via ORAL
  Filled 2016-08-07: qty 3

## 2016-08-07 NOTE — Telephone Encounter (Signed)
Patient will pick up order tomorrow. Nat Christen, CMA

## 2016-08-07 NOTE — ED Provider Notes (Signed)
[facility] DEPT Provider Note   CSN: [id number] Arrival date & time: 08/06/16  2325     History   Chief Complaint Chief Complaint  Patient presents with  . Asthma  . Shortness of Breath  . throat pain    HPI Alexander Reeves is a 51 y.o. male.  The history is provided by the patient. A language interpreter was used.  He has a history of asthma, and states that his home nebulizer is not working. He noted increased difficulty breathing this afternoon. There has been a cough productive of a small amount of clear sputum. He denies fever, chills, sweats. He had been seen in the emergency department last week, and was given a prescription for prednisone. However, there are some insurance problems, and he could not afford the medication. He also was not able to get his inhaler prescription filled because of cost.  Past Medical History:  Diagnosis Date  . Arthritis   . Asthma   . Deaf   . GERD (gastroesophageal reflux disease)   . Thyroid disease     Patient Active Problem List   Diagnosis Date Noted  . Severe asthma 07/04/2016    History reviewed. No pertinent surgical history.     Home Medications    Prior to Admission medications   Medication Sig Start Date End Date Taking? Authorizing Provider  albuterol (PROAIR HFA) 108 (90 Base) MCG/ACT inhaler Inhale 1-2 puffs into the lungs every 6 (six) hours as needed for wheezing or shortness of breath. 07/31/16   Rancour, Annie Main, MD  Fluticasone-Salmeterol (ADVAIR DISKUS) 250-50 MCG/DOSE AEPB Inhale 1 puff into the lungs 2 (two) times daily. 07/31/16   Rancour, Annie Main, MD  omeprazole (PRILOSEC) 40 MG capsule Take 1 capsule (40 mg total) by mouth daily. 06/02/16   Clent Demark, PA-C  predniSONE (DELTASONE) 50 MG tablet 1 tablet PO daily 07/31/16   Ezequiel Essex, MD    Family History No family history on file.  Social History Social History  Substance Use Topics  . Smoking status: Former Research scientist (life sciences)  . Smokeless  tobacco: Never Used  . Alcohol use No     Allergies   Penicillins   Review of Systems Review of Systems  All other systems reviewed and are negative.    Physical Exam Updated Vital Signs BP (!) 137/96 (BP Location: Right Arm)   Pulse 80   Temp 98.2 F (36.8 C) (Oral)   Resp 15   Ht 5\' 10"  (1.778 m)   Wt 70.3 kg (155 lb)   SpO2 95%   BMI 22.24 kg/m   Physical Exam  Nursing note and vitals reviewed.  51 year old male, resting comfortably and in no acute distress. Vital signs are significant for hypertension. Oxygen saturation is 95%, which is normal. Head is normocephalic and atraumatic. PERRLA, EOMI. Oropharynx is clear. Neck is nontender and supple without adenopathy or JVD. Back is nontender and there is no CVA tenderness. Lungs have mild expiratory wheezes. There are no rales or rhonchi. Chest is nontender. Heart has regular rate and rhythm without murmur. Abdomen is soft, flat, nontender without masses or hepatosplenomegaly and peristalsis is normoactive. Extremities have no cyanosis or edema, full range of motion is present. Skin is warm and dry without rash. Neurologic: Mental status is normal, cranial nerves are intact, there are no motor or sensory deficits.  ED Treatments / Results  Labs (all labs ordered are listed, but only abnormal results are displayed) Labs Reviewed  CBC - Abnormal; Notable for  the following:       Result Value   MCH 25.9 (*)    All other components within normal limits  BASIC METABOLIC PANEL  I-STAT TROPONIN, ED    EKG  EKG Interpretation  Date/Time:  Wednesday August 06 2016 23:28:47 EDT Ventricular Rate:  96 PR Interval:  162 QRS Duration: 78 QT Interval:  360 QTC Calculation: 454 R Axis:   89 Text Interpretation:  Normal sinus rhythm Normal ECG When compared with ECG of 07/31/2016, No significant change was found Confirmed by Delora Fuel ([id number]) on 08/06/2016 11:47:39 PM       Radiology Dg Chest 2 View  Result  Date: 08/07/2016 CLINICAL DATA:  Dyspnea tonight EXAM: CHEST  2 VIEW COMPARISON:  07/31/2016 FINDINGS: The lungs are clear. The pulmonary vasculature is normal. Heart size is normal. Hilar and mediastinal contours are unremarkable. There is no pleural effusion. IMPRESSION: No active cardiopulmonary disease. Electronically Signed   By: Andreas Newport M.D.   On: 08/07/2016 00:05    Procedures Procedures (including critical care time)  Medications Ordered in ED Medications  ipratropium-albuterol (DUONEB) 0.5-2.5 (3) MG/3ML nebulizer solution 3 mL (not administered)  albuterol (PROVENTIL HFA;VENTOLIN HFA) 108 (90 Base) MCG/ACT inhaler 2 puff (not administered)  predniSONE (DELTASONE) tablet 60 mg (not administered)  albuterol (PROVENTIL) (2.5 MG/3ML) 0.083% nebulizer solution 5 mg (5 mg Nebulization Given 08/06/16 2332)     Initial Impression / Assessment and Plan / ED Course  I have reviewed the triage vital signs and the nursing notes.  Pertinent labs & imaging results that were available during my care of the patient were reviewed by me and considered in my medical decision making (see chart for details).  Exacerbation of asthma. Old records are reviewed confirming ED visit 6 days ago. He was given prescription for prednisone 50 mg at that time. He will be given albuterol with ipratropium in the ED, and initial dose of prednisone given. He will be discharged with prescription for prednisone 20 mg tablets to take 2 tablets a day for 5 days. He is referred back to his PCP for assistance in getting his nebulizer replaced.  Final Clinical Impressions(s) / ED Diagnoses   Final diagnoses:  Mild intermittent asthma with exacerbation    New Prescriptions Current Discharge Medication List       Delora Fuel, MD 62/69/48 [phone]

## 2016-08-07 NOTE — Telephone Encounter (Signed)
FWD to PCP. Alexander Reeves S Hazel Wrinkle  

## 2016-08-07 NOTE — Telephone Encounter (Signed)
I have printed the order for a nebulizer for patient to pick up and take to a medical supply store. I have sent Duoneb to his pharmacy.

## 2016-08-07 NOTE — Telephone Encounter (Signed)
Patient called stated his nebulizer machine broke, and needs a replacement. Stated he received this machines years ago when lived in [place].  Please follow up with patient.

## 2016-08-10 ENCOUNTER — Other Ambulatory Visit: Payer: Self-pay

## 2016-08-10 ENCOUNTER — Inpatient Hospital Stay (HOSPITAL_COMMUNITY)
Admission: EM | Admit: 2016-08-10 | Discharge: 2016-08-13 | DRG: 203 | Disposition: A | Discharge status: Home / Self Care | Payer: Medicare Other | Attending: Internal Medicine | Admitting: Internal Medicine

## 2016-08-10 ENCOUNTER — Encounter (HOSPITAL_COMMUNITY): Payer: Self-pay | Admitting: Emergency Medicine

## 2016-08-10 DIAGNOSIS — R0602 Shortness of breath: Secondary | ICD-10-CM | POA: Diagnosis not present

## 2016-08-10 DIAGNOSIS — R070 Pain in throat: Secondary | ICD-10-CM

## 2016-08-10 DIAGNOSIS — K219 Gastro-esophageal reflux disease without esophagitis: Secondary | ICD-10-CM | POA: Diagnosis present

## 2016-08-10 DIAGNOSIS — Z9114 Patient's other noncompliance with medication regimen: Secondary | ICD-10-CM

## 2016-08-10 DIAGNOSIS — H9190 Unspecified hearing loss, unspecified ear: Secondary | ICD-10-CM | POA: Diagnosis present

## 2016-08-10 DIAGNOSIS — J45909 Unspecified asthma, uncomplicated: Secondary | ICD-10-CM

## 2016-08-10 DIAGNOSIS — Z7951 Long term (current) use of inhaled steroids: Secondary | ICD-10-CM | POA: Diagnosis not present

## 2016-08-10 DIAGNOSIS — Z79899 Other long term (current) drug therapy: Secondary | ICD-10-CM

## 2016-08-10 DIAGNOSIS — Z91138 Patient's unintentional underdosing of medication regimen for other reason: Secondary | ICD-10-CM | POA: Diagnosis not present

## 2016-08-10 DIAGNOSIS — J45901 Unspecified asthma with (acute) exacerbation: Principal | ICD-10-CM | POA: Diagnosis present

## 2016-08-10 DIAGNOSIS — Z87891 Personal history of nicotine dependence: Secondary | ICD-10-CM

## 2016-08-10 DIAGNOSIS — T50996A Underdosing of other drugs, medicaments and biological substances, initial encounter: Secondary | ICD-10-CM | POA: Diagnosis not present

## 2016-08-10 DIAGNOSIS — R0902 Hypoxemia: Secondary | ICD-10-CM | POA: Diagnosis present

## 2016-08-10 DIAGNOSIS — J4551 Severe persistent asthma with (acute) exacerbation: Secondary | ICD-10-CM | POA: Diagnosis present

## 2016-08-10 DIAGNOSIS — Z880 Allergy status to penicillin: Secondary | ICD-10-CM | POA: Diagnosis not present

## 2016-08-10 MED ORDER — ENOXAPARIN SODIUM 40 MG/0.4ML ~~LOC~~ SOLN: 40.0000 mg | SUBCUTANEOUS | Status: DC

## 2016-08-10 MED ORDER — IPRATROPIUM BROMIDE 0.02 % IN SOLN
0.5000 mg | Freq: Once | RESPIRATORY_TRACT | Status: AC
  Administered 2016-08-10: 0.5 mg via RESPIRATORY_TRACT
  Filled 2016-08-10: qty 2.5

## 2016-08-10 MED ORDER — ACETAMINOPHEN 500 MG PO TABS
1000.0000 mg | ORAL_TABLET | Freq: Once | ORAL | Status: AC
  Administered 2016-08-10: 1000 mg via ORAL
  Filled 2016-08-10: qty 2

## 2016-08-10 MED ORDER — MOMETASONE FURO-FORMOTEROL FUM 200-5 MCG/ACT IN AERO
2.0000 | INHALATION_SPRAY | Freq: Two times a day (BID) | RESPIRATORY_TRACT | Status: DC
  Administered 2016-08-10 – 2016-08-13 (×3): 2 via RESPIRATORY_TRACT
  Filled 2016-08-10 (×2): qty 8.8

## 2016-08-10 MED ORDER — IPRATROPIUM-ALBUTEROL 0.5-2.5 (3) MG/3ML IN SOLN
3.0000 mL | Freq: Three times a day (TID) | RESPIRATORY_TRACT | Status: DC
  Administered 2016-08-11 – 2016-08-13 (×7): 3 mL via RESPIRATORY_TRACT
  Filled 2016-08-10 (×7): qty 3

## 2016-08-10 MED ORDER — ALBUTEROL SULFATE (2.5 MG/3ML) 0.083% IN NEBU
5.0000 mg | INHALATION_SOLUTION | Freq: Once | RESPIRATORY_TRACT | Status: AC
  Administered 2016-08-10: 5 mg via RESPIRATORY_TRACT
  Filled 2016-08-10: qty 6

## 2016-08-10 MED ORDER — ALBUTEROL (5 MG/ML) CONTINUOUS INHALATION SOLN
10.0000 mg/h | INHALATION_SOLUTION | Freq: Once | RESPIRATORY_TRACT | Status: AC
  Administered 2016-08-10: 10 mg/h via RESPIRATORY_TRACT
  Filled 2016-08-10: qty 20

## 2016-08-10 MED ORDER — ALBUTEROL SULFATE (2.5 MG/3ML) 0.083% IN NEBU: 2.5000 mg | INHALATION_SOLUTION | RESPIRATORY_TRACT | Status: DC | PRN

## 2016-08-10 MED ORDER — METHYLPREDNISOLONE SODIUM SUCC 40 MG IJ SOLR
40.0000 mg | Freq: Two times a day (BID) | INTRAMUSCULAR | Status: DC
  Administered 2016-08-10 – 2016-08-12 (×5): 40 mg via INTRAVENOUS
  Filled 2016-08-10 (×5): qty 1

## 2016-08-10 MED ORDER — IPRATROPIUM-ALBUTEROL 0.5-2.5 (3) MG/3ML IN SOLN
3.0000 mL | Freq: Four times a day (QID) | RESPIRATORY_TRACT | Status: DC
  Administered 2016-08-10 (×3): 3 mL via RESPIRATORY_TRACT
  Filled 2016-08-10 (×3): qty 3

## 2016-08-10 MED ORDER — MAGNESIUM SULFATE IN D5W 1-5 GM/100ML-% IV SOLN
1.0000 g | Freq: Once | INTRAVENOUS | Status: AC
  Administered 2016-08-10: 1 g via INTRAVENOUS
  Filled 2016-08-10: qty 100

## 2016-08-10 MED ORDER — ALBUTEROL SULFATE HFA 108 (90 BASE) MCG/ACT IN AERS: 1.0000 | INHALATION_SPRAY | Freq: Four times a day (QID) | RESPIRATORY_TRACT | 0 refills | Status: DC | PRN

## 2016-08-10 MED ORDER — PANTOPRAZOLE SODIUM 40 MG PO TBEC
80.0000 mg | DELAYED_RELEASE_TABLET | Freq: Every day | ORAL | Status: DC
  Administered 2016-08-10 – 2016-08-13 (×4): 80 mg via ORAL
  Filled 2016-08-10 (×4): qty 2

## 2016-08-10 MED ORDER — PNEUMOCOCCAL VAC POLYVALENT 25 MCG/0.5ML IJ INJ
0.5000 mL | INJECTION | INTRAMUSCULAR | Status: DC
  Filled 2016-08-10: qty 0.5

## 2016-08-10 NOTE — ED Provider Notes (Signed)
[facility] DEPT Provider Note   CSN: [id number] Arrival date & time: 08/10/16  0218     History   Chief Complaint Chief Complaint  Patient presents with  . Shortness of Breath    HPI Alexander Reeves is a 51 y.o. male.  Patient with history of asthma presents with uncontrolled wheezing. He reports last admission was last week, he is still on steroids, and was given proventil inhaler on discharge. He has continued to use the inhaler but reports it does not help. No fever. He states he has used [employer]" specifically in the past and that it works better than any other Albuterol inhaler. No vomiting. He arrives via EMS having received 125 mg SoluMedrol and 2 duonebs.    The history is provided by the patient. A language interpreter was used (sign Language interpreter at bedside).  Shortness of Breath  Associated symptoms include wheezing. Pertinent negatives include no fever, no chest pain and no vomiting.    Past Medical History:  Diagnosis Date  . Arthritis   . Asthma   . Deaf   . GERD (gastroesophageal reflux disease)   . Thyroid disease     Patient Active Problem List   Diagnosis Date Noted  . Severe asthma 07/04/2016    History reviewed. No pertinent surgical history.     Home Medications    Prior to Admission medications   Medication Sig Start Date End Date Taking? Authorizing Provider  albuterol (PROAIR HFA) 108 (90 Base) MCG/ACT inhaler Inhale 1-2 puffs into the lungs every 6 (six) hours as needed for wheezing or shortness of breath. 07/31/16   Rancour, Annie Main, MD  Fluticasone-Salmeterol (ADVAIR DISKUS) 250-50 MCG/DOSE AEPB Inhale 1 puff into the lungs 2 (two) times daily. 07/31/16   Rancour, Annie Main, MD  ipratropium-albuterol (DUONEB) 0.5-2.5 (3) MG/3ML SOLN Take 3 mLs by nebulization every 6 (six) hours as needed. 08/07/16   Clent Demark, PA-C  omeprazole (PRILOSEC) 40 MG capsule Take 1 capsule (40 mg total) by mouth daily. 06/02/16   Clent Demark,  PA-C  predniSONE (DELTASONE) 20 MG tablet Take 2 tablets (40 mg total) by mouth daily. 1 tablet PO daily 0/99/83   Delora Fuel, MD    Family History No family history on file.  Social History Social History  Substance Use Topics  . Smoking status: Former Research scientist (life sciences)  . Smokeless tobacco: Never Used  . Alcohol use No     Allergies   Penicillins   Review of Systems Review of Systems  Constitutional: Negative for chills and fever.  HENT: Negative.  Negative for congestion.   Respiratory: Positive for chest tightness, shortness of breath and wheezing.   Cardiovascular: Negative.  Negative for chest pain.  Gastrointestinal: Negative.  Negative for vomiting.  Musculoskeletal: Negative.   Skin: Negative.   Neurological: Negative.      Physical Exam Updated Vital Signs BP (!) 131/94   Pulse 96   Resp 13   Ht 5\' 10"  (1.778 m)   Wt 70.3 kg (155 lb)   SpO2 99%   BMI 22.24 kg/m   Physical Exam  Constitutional: He is oriented to person, place, and time. He appears well-developed and well-nourished.  HENT:  Head: Normocephalic.  Neck: Normal range of motion. Neck supple.  Cardiovascular: Normal rate and regular rhythm.   Pulmonary/Chest: Effort normal. He has wheezes (Bilateral inspiratory and expiratory wheezing. ** Examined after 2 duonebs in route by EMS.). He has no rales. He exhibits no tenderness.  Abdominal: Soft. Bowel sounds are  normal. There is no tenderness. There is no rebound and no guarding.  Musculoskeletal: Normal range of motion.  Neurological: He is alert and oriented to person, place, and time.  Skin: Skin is warm and dry. No rash noted.  Psychiatric: He has a normal mood and affect.     ED Treatments / Results  Labs (all labs ordered are listed, but only abnormal results are displayed) Labs Reviewed - No data to display  EKG  EKG Interpretation None       Radiology No results found.  Procedures Procedures (including critical care  time)  Medications Ordered in ED Medications  magnesium sulfate IVPB 1 g 100 mL (1 g Intravenous New Bag/Given 08/10/16 0458)  albuterol (PROVENTIL) (2.5 MG/3ML) 0.083% nebulizer solution 5 mg (5 mg Nebulization Given 08/10/16 0351)  ipratropium (ATROVENT) nebulizer solution 0.5 mg (0.5 mg Nebulization Given 08/10/16 0351)  albuterol (PROVENTIL,VENTOLIN) solution continuous neb (10 mg/hr Nebulization Given 08/10/16 0444)  ipratropium (ATROVENT) nebulizer solution 0.5 mg (0.5 mg Nebulization Given 08/10/16 0444)  acetaminophen (TYLENOL) tablet 1,000 mg (1,000 mg Oral Given 08/10/16 0458)     Initial Impression / Assessment and Plan / ED Course  I have reviewed the triage vital signs and the nursing notes.  Pertinent labs & imaging results that were available during my care of the patient were reviewed by me and considered in my medical decision making (see chart for details).     Patient presents with wheezing and SOB, uncontrolled with inhaler at home. Already on steroids. He states he is scheduled to get a new nebulizer machine on Tuesday of this week (in 3 days).   Plan: additional breathing treatment(s) and reassess for ability to be discharged.   Patient received one duoneb and a 10 mg hour long duoneb with resolution of wheezing. He is comfortable with discharge home and has a scheduled appointment with PCP this week. Return precautions discussed.   ADDENDUM: on final re-evaluation the patient is found to have O2 saturations 85-88% on room air. He is ambulated and desats further to 70's. Will admit for further management.   Final Clinical Impressions(s) / ED Diagnoses   Final diagnoses:  None   1. Asthma exacerbation 2. Hypoxia   New Prescriptions New Prescriptions   No medications on file     Charlann Lange, Hershal Coria 08/10/16 [id number]    Charlann Lange, PA-C 08/10/16 9470    Orpah Greek, MD 08/17/16 [phone]

## 2016-08-10 NOTE — H&P (Signed)
Date: 08/10/2016               Patient Name:  Alexander Reeves MRN: [id number]  DOB: 1965-02-14 Age / Sex: 51 y.o., male   PCP: Clent Demark, PA-C         Medical Service: Internal Medicine Teaching Service         Attending Physician: Dr. Lucious Groves, DO    First Contact: Dr. Trilby Drummer Pager: [phone]  Second Contact: Dr. Benjamine Mola Pager: [phone]       After Hours (After 5p/  First Contact Pager: [phone]  weekends / holidays): Second Contact Pager: [phone]   Chief Complaint: Shortness of Breath  History of Present Illness: Alexander Reeves is a 51 year old male with a history of deafness, asthma and GERD who presents with 1 day of shortness of breath. History was obtained with the assistance of a sign language interpreter. The SOB began around midnight. He states that he had recently run out of his Proair, but did have a  "yellow inhaler", which did not help (He was recently prescribed Advair, but had not yet picked it up). He would usually try to use his nebulizer during an exacerbation, however, it is currently broken (he has a referral for medical supplies but is waiting until payday to purchase a new one.) After he was unable to obtain relief at home he came to the ED. He states that the SOB is worse with activity and his oxygen saturation (on nasal cannula) dropped from the mid 90s into the high 80s while using sign language during the interview. He states he has had 5 exacerbations this summer (most recently on 7/26), but none this bad. On 7/26 he was discharged form the ED with a prescription for prednisone 20 mg BID  for 5 days, but he was unable to pick this up yet due to financial restraints. He also states that he has never had an exacerbation so bad that he required the use of a ventilator. His asthma is worse on hot days and agitated by dust (he tries to keep his home clean). He endorses recent productive cough. He denies family history of asthma and states that he was diagnosed at  51 years of age.  Meds:  Current Meds  Medication Sig  . omeprazole (PRILOSEC) 40 MG capsule Take 1 capsule (40 mg total) by mouth daily.  . [DISCONTINUED] albuterol (PROAIR HFA) 108 (90 Base) MCG/ACT inhaler Inhale 1-2 puffs into the lungs every 6 (six) hours as needed for wheezing or shortness of breath.    Allergies: Allergies as of 08/10/2016 - Review Complete 08/10/2016  Allergen Reaction Noted  . Penicillins Anaphylaxis 12/07/2015   Past Medical History:  Diagnosis Date  . Arthritis   . Asthma   . Deaf   . GERD (gastroesophageal reflux disease)   . Thyroid disease     Family History: History reviewed. No pertinent family history.  Social History:  Social History   Social History  . Marital status: Single    Spouse name: N/A  . Number of children: N/A  . Years of education: N/A   Occupational History  . Not on file.   Social History Main Topics  . Smoking status: Former Research scientist (life sciences)  . Smokeless tobacco: Never Used  . Alcohol use No  . Drug use: No  . Sexual activity: Not on file   Other Topics Concern  . Not on file   Social History Narrative  . No narrative  on file    Review of Systems: A complete ROS was negative except as per HPI.  Physical Exam: Blood pressure (!) 125/92, pulse 100, resp. rate 16, height 5\' 10"  (1.778 m), weight 155 lb (70.3 kg), SpO2 98 %. Physical Exam  Constitutional: He is oriented to person, place, and time. He appears well-developed and well-nourished.  Pleasant, Deaf Male  HENT:  Head: Normocephalic and atraumatic.  Eyes: EOM are normal. No scleral icterus.  Cardiovascular: Normal rate, regular rhythm and normal heart sounds.  Exam reveals no friction rub.   No murmur heard. Pulmonary/Chest: Effort normal. No respiratory distress.  Faint bibasilar end expiratory wheezes  Abdominal: Soft. Bowel sounds are normal. He exhibits no distension. There is no tenderness.  Musculoskeletal: Normal range of motion. He exhibits no  edema, tenderness or deformity.  Neurological: He is alert and oriented to person, place, and time.  Skin: Skin is warm and dry.    Assessment & Plan by Problem:  Asthma exacerbation: With recent nebulizer malfunction and financial restraints preventing patient from picking up medications and supplies until next week. - Solumedrol 40mg  IV q12h - Duoneb q6h  - Albuterol q4h PRN - Dulera 2 puff BID - AM BMP & CBC  GERD: - Pantoprazole 80mg  Daily  F/EN: Regular Diet  DVT Prophylaxis: Lovenox 40 Code Status: Full  Dispo: Admit patient to Inpatient with expected length of stay greater than 2 midnights.   Signed: Neva Seat, DO IM PGY-1 Pager: [phone]

## 2016-08-10 NOTE — Progress Notes (Signed)
New Admission Note:    Arrival Method: Stretcher from ER Mental Orientation: Alert and Oriented x 4. Pt is completely deaf Assessment: In progress Skin: WNL IV: In palce Pain: 0/10 Safety Measures: Side rails in place, call bell in reach Admission: Completed 6E Orientation: Completed  Orders have been reviewed and implemented.  Will continue to monitor the patient.

## 2016-08-10 NOTE — ED Notes (Signed)
Lunch ordered. Family and patient given update on room placement.

## 2016-08-10 NOTE — Progress Notes (Signed)
Initial assessment of patient performed with Nurse and Sign Language Interpretor at Bedside.

## 2016-08-10 NOTE — Progress Notes (Signed)
Pt had NIF of -50 and VC of 1.5L

## 2016-08-10 NOTE — ED Triage Notes (Signed)
Pt c/o shortness of breath, hx asthma, not getting any relief from albuterol inhaler. Wheezing in the bilateral lobes, given 2 duonebs and 125mg  solumedrol PTA. EMS vitals: BP-140/82, HR-100

## 2016-08-10 NOTE — ED Notes (Signed)
Ambulated in hall while checking O2 sat.  Noted to drop to 88%.  Noted to be nonlabored.  Provider made aware of drop.

## 2016-08-10 NOTE — ED Notes (Signed)
Patient eating lunch.

## 2016-08-10 NOTE — ED Notes (Signed)
Pt is deaf, daughter, Lafayette Dragon,  phone number: [phone]

## 2016-08-11 LAB — CBC
HCT: 40.2 % (ref 39.0–52.0)
HEMOGLOBIN: 13 g/dL (ref 13.0–17.0)
MCH: 26.3 pg (ref 26.0–34.0)
MCHC: 32.3 g/dL (ref 30.0–36.0)
MCV: 81.2 fL (ref 78.0–100.0)
Platelets: 168 10*3/uL (ref 150–400)
RBC: 4.95 MIL/uL (ref 4.22–5.81)
RDW: 14.8 % (ref 11.5–15.5)
WBC: 10.5 10*3/uL (ref 4.0–10.5)

## 2016-08-11 LAB — BASIC METABOLIC PANEL
ANION GAP: 6 (ref 5–15)
BUN: 13 mg/dL (ref 6–20)
CALCIUM: 9 mg/dL (ref 8.9–10.3)
CHLORIDE: 108 mmol/L (ref 101–111)
CO2: 26 mmol/L (ref 22–32)
CREATININE: 1.2 mg/dL (ref 0.61–1.24)
GFR calc non Af Amer: >60 mL/min (ref >60)
GLUCOSE: 97 mg/dL (ref 65–99)
Potassium: 4.4 mmol/L (ref 3.5–5.1)
Sodium: 140 mmol/L (ref 135–145)

## 2016-08-11 NOTE — Progress Notes (Signed)
Pt achieved >-50 on NIF with excellent effort. VC not available at this time. RT will check back later.

## 2016-08-11 NOTE — Progress Notes (Signed)
Patient ID: Alexander Reeves, male   DOB: 1965-11-05, 51 y.o.   MRN: [id number]   Subjective: Alexander Reeves is doing better this morning, but he continues to experience shortness of breath on exertion. Hew was able to ambulate today with his oxygen saturations staying at 100%, however he remained symptomatic as above. He states that the nebulizations do help, but the effect does not last long.  Objective:  Vital signs in last 24 hours: Vitals:   08/10/16 1955 08/11/16 0427 08/11/16 0836 08/11/16 0933  BP: 126/72 126/75 (!) 109/92   Pulse: (!) 108 (!) 103 86   Resp: 18 19 18    Temp: 97.9 F (36.6 C) 97.7 F (36.5 C) 97.6 F (36.4 C)   TempSrc: Oral Oral Oral   SpO2: 98% 98% 96% 93%  Weight:      Height:       Physical Exam  Constitutional: He is oriented to person, place, and time. He appears well-developed and well-nourished.  HENT:  Head: Normocephalic and atraumatic.  Eyes: EOM are normal. Right eye exhibits no discharge. Left eye exhibits no discharge.  Cardiovascular: Normal rate, regular rhythm and normal heart sounds.  Exam reveals no gallop and no friction rub.   No murmur heard. Pulmonary/Chest: Effort normal and breath sounds normal. No respiratory distress. He has no wheezes. He has no rales.  Abdominal: Soft. Bowel sounds are normal. He exhibits no distension. There is no tenderness.  Musculoskeletal: Normal range of motion. He exhibits no edema or deformity.  Neurological: He is alert and oriented to person, place, and time.  Skin: Skin is warm and dry.    Assessment/Plan:  Asthma exacerbation: With recent nebulizer malfunction and financial restraints preventing patient from picking up medications and supplies until later in the week. Was able to ambulate with pulse ox remaining at 100%, but still feels symptomatic. He has had 5 or 6 exacerbations this summer. - AM BMP & CBC (7/30) showed no significant derrangements  - Solumedrol 40mg  IV q12h - Duoneb q6h  -  Albuterol q4h PRN - Dulera 2 puff BID  GERD: - Pantoprazole 80mg  Daily  F/EN: Regular Diet  DVT Prophylaxis: Lovenox 40 Code Status: Full  Dispo: Anticipated discharge in approximately 1 - 2 day(s).   Neva Seat, DO IM PGY-1 Pager: [phone]

## 2016-08-11 NOTE — Progress Notes (Signed)
SATURATION QUALIFICATIONS: (This note is used to comply with regulatory documentation for home oxygen)  Patient Saturations on Room Air at Rest = 98%  Patient Saturations on Room Air while Ambulating = 100%  Patient Saturations on 0 Liters of oxygen while Ambulating = 98%  Please briefly explain why patient needs home oxygen: Patient did c/o feeling short of breath while ambulating, Respiratory called for breathing treatment. Will continue to monitor patient.

## 2016-08-12 ENCOUNTER — Inpatient Hospital Stay (INDEPENDENT_AMBULATORY_CARE_PROVIDER_SITE_OTHER): Payer: Medicare Other | Admitting: Physician Assistant

## 2016-08-12 MED ORDER — SODIUM CHLORIDE 0.9% FLUSH
3.0000 mL | Freq: Two times a day (BID) | INTRAVENOUS | Status: DC
  Administered 2016-08-12 – 2016-08-13 (×4): 3 mL via INTRAVENOUS

## 2016-08-12 MED ORDER — PREDNISONE 20 MG PO TABS
40.0000 mg | ORAL_TABLET | Freq: Every day | ORAL | Status: DC
  Administered 2016-08-13: 40 mg via ORAL
  Filled 2016-08-12: qty 2

## 2016-08-12 NOTE — Progress Notes (Signed)
Patient performed a -35 on NIF with decent effort. VC not available at this time. This RT is covering the ED as well and will try to get back in with the [employer].

## 2016-08-12 NOTE — Progress Notes (Signed)
Patient had 150L on peak flow with good effort.

## 2016-08-12 NOTE — Progress Notes (Signed)
Patient had a VC of 1.1L with great effort.

## 2016-08-12 NOTE — Progress Notes (Addendum)
Patient ID: Alexander Reeves, male   DOB: January 18, 1965, 51 y.o.   MRN: [id number]   Subjective: Alexander Reeves is doing well this morning, but he continues to experience shortness of breath on exertion. He states that the nebulizations do help, but the effect does only lasts for about four hours. States he will be able to pick up his medications tomorrow.  Objective:  Vital signs in last 24 hours: Vitals:   08/11/16 1959 08/11/16 2224 08/12/16 0839 08/12/16 0858  BP:  132/90  123/76  Pulse:  86  84  Resp:  18  18  Temp:  97.9 F (36.6 C)  98 F (36.7 C)  TempSrc:  Oral  Oral  SpO2: 97% 97% 94% 91%  Weight:  158 lb 11.2 oz (72 kg)    Height:       Physical Exam  Constitutional: He is oriented to person, place, and time. He appears well-developed and well-nourished.  HENT:  Head: Normocephalic and atraumatic.  Eyes: EOM are normal. Right eye exhibits no discharge. Left eye exhibits no discharge.  Cardiovascular: Normal rate, regular rhythm and normal heart sounds.  Exam reveals no gallop and no friction rub.   No murmur heard. Pulmonary/Chest: Effort normal. No respiratory distress. He has wheezes. He has no rales.  Abdominal: Soft. Bowel sounds are normal. He exhibits no distension. There is no tenderness.  Musculoskeletal: Normal range of motion. He exhibits no edema or deformity.  Neurological: He is alert and oriented to person, place, and time.  Skin: Skin is warm and dry.   Assessment/Plan:  Asthma exacerbation: With recent nebulizer malfunction and financial restraints preventing patient from picking up medications and supplies until later in the week. Was able to ambulate with pulse ox remaining at 100%, but still feels symptomatic. He has had 5 or 6 exacerbations this summer. BMP & CBC (7/30) showed no significant derangements. - Duoneb q6h  - Albuterol q4h PRN - Dulera 2 puff BID - Peak Flow - Care Management for medication assisstance - ADDENDUM: Transitioned to oral  prednisone 40 mg Daily  GERD: - Pantoprazole 80mg  Daily  F/EN: Regular Diet  DVT Prophylaxis: Lovenox 40 Code Status: Full  Dispo: Anticipated discharge in approximately 1 day(s).   Neva Seat, DO IM PGY-1 Pager: [phone]

## 2016-08-12 NOTE — Care Management (Addendum)
CM alert by CSW that pt states he is unable to afford medication copays with active insurance coverage.  Pt has active medicare and therefore CM can not offer medication assistance related cost hardship.  CM will continue to follow for discharge needs

## 2016-08-12 NOTE — Progress Notes (Signed)
Internal Medicine Attending:   I saw and examined the patient. I reviewed the resident's note and I agree with the resident's findings and plan as documented in the resident's note. Sign language interpreter used at bedside.  Overall feeling mildly better, still feeling very dyspnic at times.  Overall he has some mild wheezing over left upper lobe but otherall moving air well.  We discussed we will not be able to get him all the way back to baseline in hospital.  However I think it would be best for one additional day in the hospital.  I would like to start him on some peak flow monitoring which may help him to visually see how much air he is moving and when to seek care before an exacerbation.  He can be switched to oral prednisone.

## 2016-08-13 DIAGNOSIS — Z91138 Patient's unintentional underdosing of medication regimen for other reason: Secondary | ICD-10-CM

## 2016-08-13 DIAGNOSIS — T50996A Underdosing of other drugs, medicaments and biological substances, initial encounter: Secondary | ICD-10-CM

## 2016-08-13 DIAGNOSIS — J45901 Unspecified asthma with (acute) exacerbation: Principal | ICD-10-CM

## 2016-08-13 MED ORDER — OMEPRAZOLE 40 MG PO CPDR: 40.0000 mg | DELAYED_RELEASE_CAPSULE | Freq: Every day | ORAL | 3 refills | Status: DC

## 2016-08-13 MED ORDER — PREDNISONE 20 MG PO TABS: 40.0000 mg | ORAL_TABLET | Freq: Every day | ORAL | 0 refills | Status: AC

## 2016-08-13 MED ORDER — FLUTICASONE-SALMETEROL 250-50 MCG/DOSE IN AEPB: 1.0000 | INHALATION_SPRAY | Freq: Two times a day (BID) | RESPIRATORY_TRACT | 0 refills | Status: DC

## 2016-08-13 MED ORDER — ALBUTEROL SULFATE HFA 108 (90 BASE) MCG/ACT IN AERS: 1.0000 | INHALATION_SPRAY | Freq: Four times a day (QID) | RESPIRATORY_TRACT | 0 refills | Status: DC | PRN

## 2016-08-13 MED ORDER — IPRATROPIUM-ALBUTEROL 0.5-2.5 (3) MG/3ML IN SOLN: 3.0000 mL | Freq: Two times a day (BID) | RESPIRATORY_TRACT | Status: DC

## 2016-08-13 MED ORDER — IPRATROPIUM-ALBUTEROL 0.5-2.5 (3) MG/3ML IN SOLN: 3.0000 mL | Freq: Four times a day (QID) | RESPIRATORY_TRACT | 5 refills | Status: DC | PRN

## 2016-08-13 MED FILL — ?PREDNISONE 20 MG TABLET: 20 | 5 days supply | Qty: 10 | Fill #0

## 2016-08-13 MED FILL — OMEPRAZOLE DR 40 MG CAPSULE: 40 | 30 days supply | Qty: 30 | Fill #0

## 2016-08-13 NOTE — Progress Notes (Signed)
CM consulted for medication assistance. Pt is active with [facility] and is able to use [facility] pharmacy to assist with the cost of his medications. CM called [facility] pharmacy and they have his inhalers and nebulizer medication in stock and he will receive a discount or samples. MD and patient made aware and he will go to [facility] before 6 pm to get his meds. Pt with orders for home nebulizer machine. Santiago Glad with [facility] notified and they will deliver the equipment to the room prior to 5 pm so he can make it to the pharmacy afterwards. Bedside RN updated.

## 2016-08-13 NOTE — Discharge Summary (Signed)
Name: Alexander Reeves MRN: [id number] DOB: August 23, 1965 51 y.o. PCP: Tawny Asal  Date of Admission: 08/10/2016  2:18 AM Date of Discharge: 08/13/2016 Attending Physician: Oval Linsey, MD  Discharge Diagnosis: 1. Asthma exacerbation, secondary to running out of medications  Active Problems:   Asthma exacerbation   Discharge Medications: Allergies as of 08/13/2016      Reactions   Penicillins Anaphylaxis      Medication List    TAKE these medications   albuterol 108 (90 Base) MCG/ACT inhaler Commonly known as:  PROAIR HFA Inhale 1-2 puffs into the lungs every 6 (six) hours as needed for wheezing or shortness of breath. Please give ProAir brand specifically What changed:  additional instructions  Another medication with the same name was removed. Continue taking this medication, and follow the directions you see here.   Fluticasone-Salmeterol 250-50 MCG/DOSE Aepb Commonly known as:  ADVAIR DISKUS Inhale 1 puff into the lungs 2 (two) times daily.   ipratropium-albuterol 0.5-2.5 (3) MG/3ML Soln Commonly known as:  DUONEB Take 3 mLs by nebulization every 6 (six) hours as needed.   omeprazole 40 MG capsule Commonly known as:  PRILOSEC Take 1 capsule (40 mg total) by mouth daily.   predniSONE 20 MG tablet Commonly known as:  DELTASONE Take 2 tablets (40 mg total) by mouth daily. 1 tablet PO daily            Durable Medical Equipment        Start     Ordered   08/13/16 1539  For home use only DME Nebulizer machine  Once    Question:  Patient needs a nebulizer to treat with the following condition  Answer:  Asthma   08/13/16 1539      Disposition and follow-up:   Alexander Reeves was discharged from [facility] in Good condition.  At the hospital follow up visit please address:  1.  Asthma exacerbation in the setting of running out of asthma medications at home. He reports his breathing is better now. He is discharged on Advair,  Duonebs, and albuterol inhaler, as well as 10 days of prednisone 40mg . Also ordered him nebulizer machine to take home. Was he able to pick up these medications and is he taking/tolerating them?  2.  Labs / imaging needed at time of follow-up: None  3.  Pending labs/ test needing follow-up: None  Follow-up Appointments: Follow-up Information    Clent Demark, PA-C.   Specialty:  Physician Assistant Why:  Appointment date on 08/20/2016 at 2:00p  Contact information: [place] [phone]           Hospital Course by problem list: Active Problems:   Asthma exacerbation   1. Asthma exacerbation in the setting of running out of asthma medications at home. He presented on 7/29 with increased shortness of breath. He received IV solumedrol, Duoneb, Albuterol PRN, and Dulera. His breathing has improved. NIF -30, VC 1.1L, peak flow 150L. He has some issues with access to medications and has had 5 asthma exacerbations this year. We were able to get him a new nebulizer machine and sent his medications to [facility], where he should be able to get a discount on all of his meds. Discharged with albuterol rescue inhaler, Duoneb, and Advair, as well as 10-days of prednisone 40mg  daily.  Discharge Vitals:   BP 124/72 (BP Location: Right Arm)   Pulse 89   Temp (!) 97.5  F (36.4 C) (Oral)   Resp 18   Ht 5\' 10"  (1.778 m)   Wt 159 lb 9.6 oz (72.4 kg)   SpO2 96%   BMI 22.90 kg/m   Pertinent Labs, Studies, and Procedures:  CBC Latest Ref Rng & Units 08/11/2016 08/06/2016 07/31/2016  WBC 4.0 - 10.5 K/uL 10.5 5.0 5.2  Hemoglobin 13.0 - 17.0 g/dL 13.0 13.5 12.8(L)  Hematocrit 39.0 - 52.0 % 40.2 42.9 40.9  Platelets 150 - 400 K/uL 168 159 131(L)   BMP Latest Ref Rng & Units 08/11/2016 08/06/2016 07/31/2016  Glucose 65 - 99 mg/dL 97 99 114(H)  BUN 6 - 20 mg/dL 13 9 13   Creatinine 0.61 - 1.24 mg/dL 1.20 1.12 1.16  BUN/Creat Ratio 9 - 20 - -  -  Sodium 135 - 145 mmol/L 140 141 140  Potassium 3.5 - 5.1 mmol/L 4.4 3.9 4.0  Chloride 101 - 111 mmol/L 108 107 105  CO2 22 - 32 mmol/L 26 27 29   Calcium 8.9 - 10.3 mg/dL 9.0 9.0 9.2   NIFx2 -30 Peak flow 150L VC 1.1L  Discharge Instructions: Discharge Instructions    Call MD for:  difficulty breathing, headache or visual disturbances    Complete by:  As directed    Call MD for:  extreme fatigue    Complete by:  As directed    Call MD for:  persistant dizziness or light-headedness    Complete by:  As directed    Call MD for:  redness, tenderness, or signs of infection (pain, swelling, redness, odor or green/yellow discharge around incision site)    Complete by:  As directed    Call MD for:  temperature >100.4    Complete by:  As directed    Diet - low sodium heart healthy    Complete by:  As directed    Discharge instructions    Complete by:  As directed    PLEASE TAKE 40MG  OF PREDNISONE FOR 10 DAYS, THEN STOP TAKING THE STEROIDS. Use your dulera inhaler 2 puffs twice a day. Use your albuterol rescue inhaler as needed. Use your nebulizer every 6 hours as needed.   Increase activity slowly    Complete by:  As directed      Signed: Colbert Ewing, MD  Internal Medicine, PGY-1 08/13/2016, 4:37 PM   Pager: Mamie Nick [phone]

## 2016-08-13 NOTE — Progress Notes (Signed)
NIF x2 -30

## 2016-08-13 NOTE — Progress Notes (Signed)
Patient discharged to home with spouse. All discharge instructions reviewed via video interpreter.  Scripts reviewed and patient aware on where to pick up scripts. IV removed. Telemetry removed. And Nebulizer delivered to patient. Patient left unit in stable condition with all belongings in tow.  Sheliah Plane RN

## 2016-08-13 NOTE — Progress Notes (Signed)
Shift assessment completed using [employer] Remote Video Interpreting.

## 2016-08-13 NOTE — Progress Notes (Signed)
Internal Medicine Attending  Date: 08/13/2016  Patient name: Alexander Reeves Medical record number: [id number] Date of birth: 03-08-1965 Age: 51 y.o. Gender: male  I saw and evaluated the patient. I reviewed the resident's note by Dr. Ronalee Reeves and I agree with the resident's findings and plans as documented in her progress note.  We communicated with Alexander Reeves via a sign language interpreter this morning. He was without complaints from a pulmonary standpoint. Physical examination revealed no wheezes and good air movement. I agree with the plan, including procurement of the necessary nebulizer machine, as outlined in Dr. Allayne Reeves note. He is stable for discharge home.

## 2016-08-13 NOTE — Progress Notes (Signed)
Patient ID: Alexander Reeves, male   DOB: March 03, 1965, 51 y.o.   MRN: [id number]   Subjective:  Alexander Reeves is doing well this morning. No acute events overnight. No chest pain or shortness of breath currently. He was able to walk around the unit and felt that his breathing was okay.  Spoke to case management and were able to get him a nebulizer machine to take home. Case management also recommended sending his medications to [facility], where he can get a discount on his medications. Plan is to discharge home today.  Objective:  Vital signs in last 24 hours: Vitals:   08/12/16 2204 08/13/16 0512 08/13/16 0827 08/13/16 0932  BP:  132/82  124/72  Pulse:  81  89  Resp:  18 18 18   Temp:  98.1 F (36.7 C)  (!) 97.5 F (36.4 C)  TempSrc:  Oral  Oral  SpO2: 94% 98%  96%  Weight:      Height:       Physical Exam  Constitutional: He is oriented to person, place, and time. He appears well-developed and well-nourished.  Sitting in bed comfortably; conversing with him through sign language translator  HENT:  Head: Normocephalic and atraumatic.  Eyes: Pupils are equal, round, and reactive to light. EOM are normal.  Neck: Normal range of motion. Neck supple.  Cardiovascular: Normal rate and regular rhythm.  Exam reveals no gallop and no friction rub.   No murmur heard. Pulmonary/Chest: Effort normal. No respiratory distress. He has no rales.  Minimal expiratory wheeze in L lung base  Abdominal: Soft. Bowel sounds are normal. He exhibits no distension. There is no tenderness.  Musculoskeletal: Normal range of motion. He exhibits no edema or deformity.  Neurological: He is alert and oriented to person, place, and time.  Skin: Skin is warm and dry.   Assessment/Plan:  Asthma exacerbation: Feeling better this morning. Able to walk around the unit without trouble breathing. Peak flow 150L, VC 1.1L, NIF -30 - Duoneb q6h, Albuterol q4h PRN, Dulera 2 puff BID - oral  prednisone 40mg  daily - ordered him a nebulizer machine for home - Appreciate Care Management assistance  GERD: - Pantoprazole 80mg  Daily  F/EN: Regular Diet DVT Prophylaxis: Lovenox 40 Code Status: Full  Dispo: to home today  Colbert Ewing, MD Internal Medicine, PGY-1 P [phone]

## 2016-08-14 MED FILL — IPRAT-ALBUT 0.5-3(2.5) MG/3: 0.5-2.5 (3) | 15 days supply | Qty: 180 | Fill #0

## 2016-08-14 MED FILL — !ADVAIR 250/50 DISKUS: 250-50 | 30 days supply | Qty: 60 | Fill #0

## 2016-08-20 ENCOUNTER — Inpatient Hospital Stay (INDEPENDENT_AMBULATORY_CARE_PROVIDER_SITE_OTHER): Payer: Medicare Other | Admitting: Physician Assistant

## 2016-09-01 ENCOUNTER — Emergency Department (HOSPITAL_COMMUNITY): Payer: Medicare Other

## 2016-09-01 ENCOUNTER — Inpatient Hospital Stay (HOSPITAL_COMMUNITY)
Admission: EM | Admit: 2016-09-01 | Discharge: 2016-09-04 | DRG: 202 | Disposition: A | Discharge status: Home / Self Care | Payer: Medicare Other | Attending: Pulmonary Disease | Admitting: Pulmonary Disease

## 2016-09-01 ENCOUNTER — Encounter (HOSPITAL_COMMUNITY): Payer: Self-pay

## 2016-09-01 DIAGNOSIS — T380X5A Adverse effect of glucocorticoids and synthetic analogues, initial encounter: Secondary | ICD-10-CM | POA: Diagnosis not present

## 2016-09-01 DIAGNOSIS — J45909 Unspecified asthma, uncomplicated: Secondary | ICD-10-CM | POA: Diagnosis present

## 2016-09-01 DIAGNOSIS — Z79899 Other long term (current) drug therapy: Secondary | ICD-10-CM | POA: Diagnosis not present

## 2016-09-01 DIAGNOSIS — J9601 Acute respiratory failure with hypoxia: Secondary | ICD-10-CM

## 2016-09-01 DIAGNOSIS — R739 Hyperglycemia, unspecified: Secondary | ICD-10-CM | POA: Diagnosis not present

## 2016-09-01 DIAGNOSIS — K219 Gastro-esophageal reflux disease without esophagitis: Secondary | ICD-10-CM | POA: Diagnosis present

## 2016-09-01 DIAGNOSIS — J9602 Acute respiratory failure with hypercapnia: Secondary | ICD-10-CM | POA: Diagnosis not present

## 2016-09-01 DIAGNOSIS — J45901 Unspecified asthma with (acute) exacerbation: Secondary | ICD-10-CM | POA: Diagnosis not present

## 2016-09-01 DIAGNOSIS — Z9114 Patient's other noncompliance with medication regimen: Secondary | ICD-10-CM | POA: Diagnosis not present

## 2016-09-01 DIAGNOSIS — R0602 Shortness of breath: Secondary | ICD-10-CM | POA: Diagnosis not present

## 2016-09-01 DIAGNOSIS — F1290 Cannabis use, unspecified, uncomplicated: Secondary | ICD-10-CM | POA: Diagnosis present

## 2016-09-01 DIAGNOSIS — F17210 Nicotine dependence, cigarettes, uncomplicated: Secondary | ICD-10-CM | POA: Diagnosis not present

## 2016-09-01 DIAGNOSIS — H9190 Unspecified hearing loss, unspecified ear: Secondary | ICD-10-CM | POA: Diagnosis present

## 2016-09-01 DIAGNOSIS — Z880 Allergy status to penicillin: Secondary | ICD-10-CM | POA: Diagnosis not present

## 2016-09-01 DIAGNOSIS — E872 Acidosis: Secondary | ICD-10-CM | POA: Diagnosis present

## 2016-09-01 DIAGNOSIS — J45902 Unspecified asthma with status asthmaticus: Secondary | ICD-10-CM

## 2016-09-01 DIAGNOSIS — J4551 Severe persistent asthma with (acute) exacerbation: Principal | ICD-10-CM

## 2016-09-01 DIAGNOSIS — R0682 Tachypnea, not elsewhere classified: Secondary | ICD-10-CM | POA: Diagnosis not present

## 2016-09-01 DIAGNOSIS — E079 Disorder of thyroid, unspecified: Secondary | ICD-10-CM | POA: Diagnosis present

## 2016-09-01 DIAGNOSIS — Z8249 Family history of ischemic heart disease and other diseases of the circulatory system: Secondary | ICD-10-CM | POA: Diagnosis not present

## 2016-09-01 LAB — I-STAT ARTERIAL BLOOD GAS, ED
Acid-base deficit: 2 mmol/L (ref 0.0–2.0)
BICARBONATE: 25.7 mmol/L (ref 20.0–28.0)
O2 Saturation: 99 %
PO2 ART: 185 mmHg — AB (ref 83.0–108.0)
Patient temperature: 97.6
TCO2: 27 mmol/L (ref 0–100)
pCO2 arterial: 54.2 mmHg — ABNORMAL HIGH (ref 32.0–48.0)
pH, Arterial: 7.28 — ABNORMAL LOW (ref 7.350–7.450)

## 2016-09-01 LAB — BASIC METABOLIC PANEL
Anion gap: 8 (ref 5–15)
BUN: 13 mg/dL (ref 6–20)
CO2: 22 mmol/L (ref 22–32)
Calcium: 8.6 mg/dL — ABNORMAL LOW (ref 8.9–10.3)
Chloride: 106 mmol/L (ref 101–111)
Creatinine, Ser: 1.09 mg/dL (ref 0.61–1.24)
GLUCOSE: 198 mg/dL — AB (ref 65–99)
Potassium: 4.4 mmol/L (ref 3.5–5.1)
Sodium: 136 mmol/L (ref 135–145)

## 2016-09-01 LAB — CBC
HEMATOCRIT: 42.9 % (ref 39.0–52.0)
HEMOGLOBIN: 13.6 g/dL (ref 13.0–17.0)
MCH: 26.3 pg (ref 26.0–34.0)
MCHC: 31.7 g/dL (ref 30.0–36.0)
MCV: 83 fL (ref 78.0–100.0)
Platelets: 144 10*3/uL — ABNORMAL LOW (ref 150–400)
RBC: 5.17 MIL/uL (ref 4.22–5.81)
RDW: 15 % (ref 11.5–15.5)
WBC: 6.2 10*3/uL (ref 4.0–10.5)

## 2016-09-01 LAB — RAPID URINE DRUG SCREEN, HOSP PERFORMED
Amphetamines: NOT DETECTED
BARBITURATES: NOT DETECTED
Benzodiazepines: NOT DETECTED
Cocaine: NOT DETECTED
Opiates: NOT DETECTED
TETRAHYDROCANNABINOL: NOT DETECTED

## 2016-09-01 LAB — TSH: TSH: 0.702 u[IU]/mL (ref 0.350–4.500)

## 2016-09-01 LAB — CBG MONITORING, ED: GLUCOSE-CAPILLARY: 175 mg/dL — AB (ref 65–99)

## 2016-09-01 LAB — T4, FREE: Free T4: 0.83 ng/dL (ref 0.61–1.12)

## 2016-09-01 IMAGING — DX DG CHEST 1V PORT
1 series · 1 of 1 positions shown · non-contrast
Comparison: [DATE] .

CLINICAL DATA: Shortness of breath.

EXAM:
PORTABLE CHEST 1 VIEW

[chest ap]
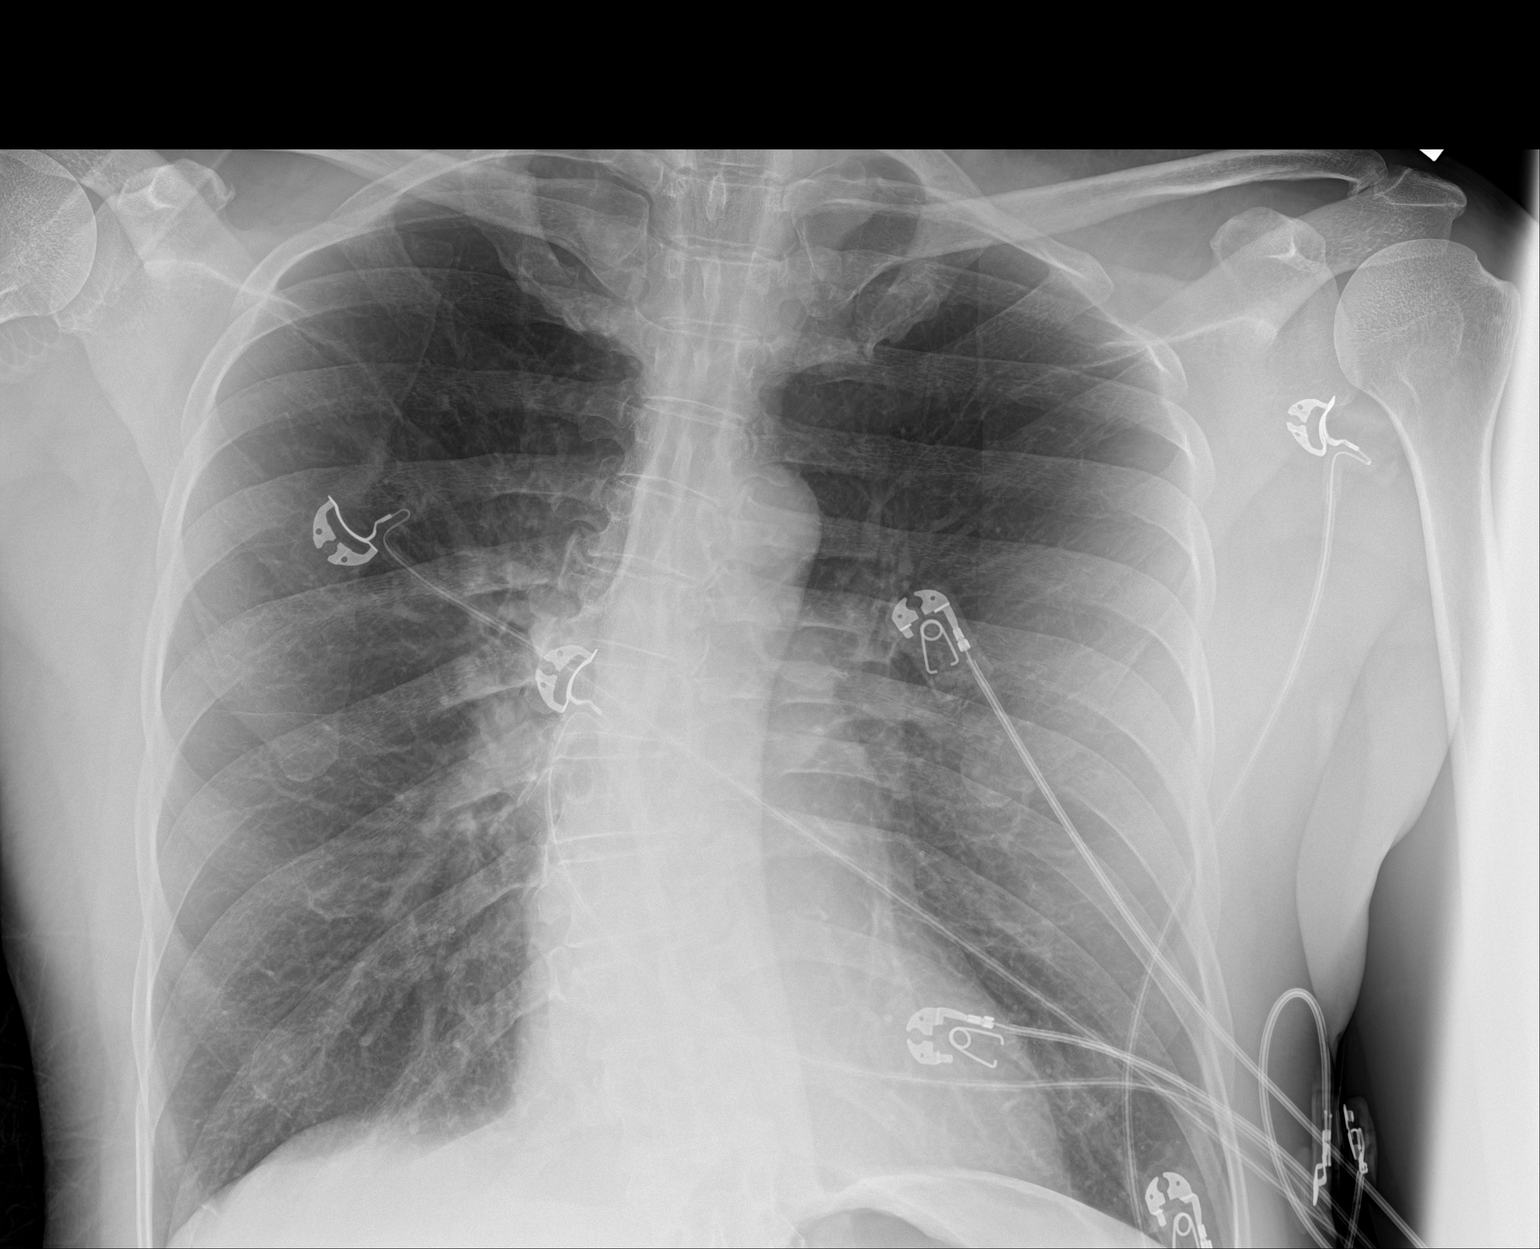

[1 of 1 positions shown; findings below may reference images not displayed]

FINDINGS: EKG leads noted over the chest . Density of the right upper lobe
most likely related to EKG lead . Mediastinum and hilar structures
normal. Lungs are clear of infiltrate. No pleural effusion or
pneumothorax. Lung bases not completely imaged. Thoracic spine
scoliosis. No acute bony abnormality.
IMPRESSION: No acute cardiopulmonary disease.

## 2016-09-01 MED ORDER — IPRATROPIUM-ALBUTEROL 0.5-2.5 (3) MG/3ML IN SOLN: 3.0000 mL | Freq: Four times a day (QID) | RESPIRATORY_TRACT | Status: DC

## 2016-09-01 MED ORDER — ALBUTEROL (5 MG/ML) CONTINUOUS INHALATION SOLN: 10.0000 mg/h | INHALATION_SOLUTION | Freq: Once | RESPIRATORY_TRACT | Status: DC

## 2016-09-01 MED ORDER — ALBUTEROL SULFATE (2.5 MG/3ML) 0.083% IN NEBU: 2.5000 mg | INHALATION_SOLUTION | RESPIRATORY_TRACT | Status: DC | PRN

## 2016-09-01 MED ORDER — MAGNESIUM SULFATE 2 GM/50ML IV SOLN
2.0000 g | Freq: Once | INTRAVENOUS | Status: AC
  Administered 2016-09-01: 2 g via INTRAVENOUS
  Filled 2016-09-01: qty 50

## 2016-09-01 MED ORDER — ACETAMINOPHEN 325 MG PO TABS
650.0000 mg | ORAL_TABLET | Freq: Four times a day (QID) | ORAL | Status: DC | PRN
  Administered 2016-09-01: 650 mg via ORAL
  Filled 2016-09-01: qty 2

## 2016-09-01 MED ORDER — BUDESONIDE 0.5 MG/2ML IN SUSP
0.5000 mg | Freq: Two times a day (BID) | RESPIRATORY_TRACT | Status: DC
  Administered 2016-09-01 – 2016-09-02 (×2): 0.5 mg via RESPIRATORY_TRACT
  Filled 2016-09-01 (×2): qty 2

## 2016-09-01 MED ORDER — METHYLPREDNISOLONE SODIUM SUCC 125 MG IJ SOLR
60.0000 mg | Freq: Three times a day (TID) | INTRAMUSCULAR | Status: DC
  Administered 2016-09-01 – 2016-09-02 (×4): 60 mg via INTRAVENOUS
  Filled 2016-09-01 (×4): qty 2

## 2016-09-01 MED ORDER — IPRATROPIUM-ALBUTEROL 0.5-2.5 (3) MG/3ML IN SOLN
3.0000 mL | RESPIRATORY_TRACT | Status: DC
  Administered 2016-09-01 – 2016-09-02 (×5): 3 mL via RESPIRATORY_TRACT
  Filled 2016-09-01 (×6): qty 3

## 2016-09-01 MED ORDER — PANTOPRAZOLE SODIUM 40 MG PO TBEC
40.0000 mg | DELAYED_RELEASE_TABLET | Freq: Every day | ORAL | Status: DC
  Administered 2016-09-02 – 2016-09-04 (×3): 40 mg via ORAL
  Filled 2016-09-01 (×3): qty 1

## 2016-09-01 MED ORDER — IPRATROPIUM BROMIDE 0.02 % IN SOLN
1.0000 mg | Freq: Once | RESPIRATORY_TRACT | Status: AC
  Administered 2016-09-01: 1 mg via RESPIRATORY_TRACT
  Filled 2016-09-01: qty 5

## 2016-09-01 MED ORDER — SODIUM CHLORIDE 0.9 % IV SOLN: 250.0000 mL | INTRAVENOUS | Status: DC | PRN

## 2016-09-01 MED ORDER — ALBUTEROL (5 MG/ML) CONTINUOUS INHALATION SOLN
10.0000 mg/h | INHALATION_SOLUTION | Freq: Once | RESPIRATORY_TRACT | Status: AC
  Administered 2016-09-01: 10 mg/h via RESPIRATORY_TRACT
  Filled 2016-09-01: qty 20

## 2016-09-01 MED ORDER — MONTELUKAST SODIUM 10 MG PO TABS
10.0000 mg | ORAL_TABLET | Freq: Every day | ORAL | Status: DC
  Administered 2016-09-01 – 2016-09-03 (×3): 10 mg via ORAL
  Filled 2016-09-01 (×3): qty 1

## 2016-09-01 MED ORDER — SODIUM CHLORIDE 0.9 % IV SOLN: INTRAVENOUS | Status: DC

## 2016-09-01 MED ORDER — SODIUM CHLORIDE 0.9 % IV BOLUS (SEPSIS)
1000.0000 mL | Freq: Once | INTRAVENOUS | Status: AC
  Administered 2016-09-01: 1000 mL via INTRAVENOUS

## 2016-09-01 NOTE — ED Provider Notes (Signed)
[facility] DEPT Provider Note   CSN: [id number] Arrival date & time: 09/01/16  0945     History   Chief Complaint Chief Complaint  Patient presents with  . Respiratory Distress    HPI Alexander Reeves is a 51 y.o. male.  HPI   51 yo M with PMHx asthma here with severe SOB. History limited 2/2 resp distress, also language barrier. ASL interpreter used on video. Pt reports that he has had worsening cough/SOB x 24 hours. This started after he was outside with his children. He was recently hospitalized for the same in setting of med nonadherence. He was given solumedrol 125, mag 1g, and duoneb en route with mild improvement. He was also placed on BIPAP.  Level 5 caveat invoked as remainder of history, ROS, and physical exam limited due to patient's resp distress.   Past Medical History:  Diagnosis Date  . Arthritis   . Asthma   . Deaf   . GERD (gastroesophageal reflux disease)   . Thyroid disease     Patient Active Problem List   Diagnosis Date Noted  . Asthma exacerbation 09/01/2016  . Chronic asthma, severe persistent, with acute exacerbation 08/10/2016  . Severe asthma 07/04/2016    Past Surgical History:  Procedure Laterality Date  . NO PAST SURGERIES         Home Medications    Prior to Admission medications   Medication Sig Start Date End Date Taking? Authorizing Provider  albuterol (PROAIR HFA) 108 (90 Base) MCG/ACT inhaler Inhale 1-2 puffs into the lungs every 6 (six) hours as needed for wheezing or shortness of breath. Please give ProAir brand specifically 08/13/16   Colbert Ewing, MD  Fluticasone-Salmeterol (ADVAIR DISKUS) 250-50 MCG/DOSE AEPB Inhale 1 puff into the lungs 2 (two) times daily. 08/13/16   Colbert Ewing, MD  ipratropium-albuterol (DUONEB) 0.5-2.5 (3) MG/3ML SOLN Take 3 mLs by nebulization every 6 (six) hours as needed. Patient taking differently: Take 3 mLs by nebulization every 6 (six) hours as needed (AS DIRECTED).  08/13/16   Colbert Ewing, MD  omeprazole (PRILOSEC) 40 MG capsule Take 1 capsule (40 mg total) by mouth daily. 08/13/16   Colbert Ewing, MD    Family History Family History  Problem Relation Age of Onset  . Heart disease Father     Social History Social History  Substance Use Topics  . Smoking status: Current Some Day Smoker    Types: Cigarettes  . Smokeless tobacco: Never Used  . Alcohol use Yes     Comment: 2 weeks ago-vodka     Allergies   Penicillins   Review of Systems Review of Systems  Constitutional: Positive for fatigue. Negative for chills and fever.  HENT: Negative for congestion and rhinorrhea.   Eyes: Negative for visual disturbance.  Respiratory: Positive for shortness of breath and wheezing. Negative for cough.   Cardiovascular: Negative for chest pain and leg swelling.  Gastrointestinal: Negative for abdominal pain, diarrhea, nausea and vomiting.  Genitourinary: Negative for dysuria and flank pain.  Musculoskeletal: Negative for neck pain and neck stiffness.  Skin: Negative for rash and wound.  Allergic/Immunologic: Negative for immunocompromised state.  Neurological: Positive for weakness. Negative for syncope and headaches.  All other systems reviewed and are negative.    Physical Exam Updated Vital Signs BP (!) 137/93   Pulse 87   Temp (!) 96.6 F (35.9 C) (Temporal)   Resp 13   Ht 5\' 10"  (1.778 m)   Wt 72.6 kg (160 lb)  SpO2 94%   BMI 22.96 kg/m   Physical Exam  Constitutional: He is oriented to person, place, and time. He appears well-developed and well-nourished. He appears distressed.  HENT:  Head: Normocephalic and atraumatic.  Eyes: Conjunctivae are normal.  Neck: Neck supple.  Cardiovascular: Regular rhythm and normal heart sounds.  Tachycardia present.  Exam reveals no friction rub.   No murmur heard. Pulmonary/Chest: Accessory muscle usage present. Tachypnea noted. No respiratory distress. He has decreased breath sounds in the right upper  field, the right middle field, the right lower field, the left upper field, the left middle field and the left lower field. He has wheezes. He has no rales.  Abdominal: He exhibits no distension.  Musculoskeletal: He exhibits no edema.  Neurological: He is alert and oriented to person, place, and time. He exhibits normal muscle tone.  Skin: Skin is warm. Capillary refill takes less than 2 seconds.  Psychiatric: He has a normal mood and affect.  Nursing note and vitals reviewed.    ED Treatments / Results  Labs (all labs ordered are listed, but only abnormal results are displayed) Labs Reviewed  CBC - Abnormal; Notable for the following:       Result Value   Platelets 144 (*)    All other components within normal limits  BASIC METABOLIC PANEL - Abnormal; Notable for the following:    Glucose, Bld 198 (*)    Calcium 8.6 (*)    All other components within normal limits  I-STAT ARTERIAL BLOOD GAS, ED - Abnormal; Notable for the following:    pH, Arterial 7.280 (*)    pCO2 arterial 54.2 (*)    pO2, Arterial 185.0 (*)    All other components within normal limits  CBG MONITORING, ED - Abnormal; Notable for the following:    Glucose-Capillary 175 (*)    All other components within normal limits  BLOOD GAS, ARTERIAL  TSH  T4, FREE  RAPID URINE DRUG SCREEN, HOSP PERFORMED    EKG  EKG Interpretation  Date/Time:  Monday September 01 2016 09:50:08 EDT Ventricular Rate:  109 PR Interval:    QRS Duration: 79 QT Interval:  342 QTC Calculation: 461 R Axis:   81 Text Interpretation:  Sinus tachycardia No significant change since last tracing Confirmed by Duffy Bruce [phone]) on 09/01/2016 10:59:17 AM       Radiology Dg Chest Port 1 View  Result Date: 09/01/2016 CLINICAL DATA:  Shortness of breath. EXAM: PORTABLE CHEST 1 VIEW COMPARISON:  08/06/2016 . FINDINGS: EKG leads noted over the chest . Density of the right upper lobe most likely related to EKG lead . Mediastinum and hilar  structures normal. Lungs are clear of infiltrate. No pleural effusion or pneumothorax. Lung bases not completely imaged. Thoracic spine scoliosis. No acute bony abnormality. IMPRESSION: No acute cardiopulmonary disease. Electronically Signed   By: Marcello Moores  Register   On: 09/01/2016 10:11    Procedures .Critical Care Performed by: Duffy Bruce Authorized by: Duffy Bruce     (including critical care time)  CRITICAL CARE Performed by: Evonnie Pat   Total critical care time: 35 minutes  Critical care time was exclusive of separately billable procedures and treating other patients.  Critical care was necessary to treat or prevent imminent or life-threatening deterioration.  Critical care was time spent personally by me on the following activities: development of treatment plan with patient and/or surrogate as well as nursing, discussions with consultants, evaluation of patient's response to treatment, examination of patient, obtaining history  from patient or surrogate, ordering and performing treatments and interventions, ordering and review of laboratory studies, ordering and review of radiographic studies, pulse oximetry and re-evaluation of patient's condition.    Medications Ordered in ED Medications  albuterol (PROVENTIL,VENTOLIN) solution continuous neb (10 mg/hr Nebulization Not Given 09/01/16 1129)  pantoprazole (PROTONIX) EC tablet 40 mg (not administered)  albuterol (PROVENTIL) (2.5 MG/3ML) 0.083% nebulizer solution 2.5 mg (not administered)  methylPREDNISolone sodium succinate (SOLU-MEDROL) 125 mg/2 mL injection 60 mg (not administered)  0.9 %  sodium chloride infusion (not administered)  0.9 %  sodium chloride infusion (not administered)  ipratropium-albuterol (DUONEB) 0.5-2.5 (3) MG/3ML nebulizer solution 3 mL (not administered)  montelukast (SINGULAIR) tablet 10 mg (not administered)  budesonide (PULMICORT) nebulizer solution 0.5 mg (not administered)  albuterol  (PROVENTIL,VENTOLIN) solution continuous neb (10 mg/hr Nebulization Given 09/01/16 1010)  ipratropium (ATROVENT) nebulizer solution 1 mg (1 mg Nebulization Given 09/01/16 1010)  magnesium sulfate IVPB 2 g 50 mL (0 g Intravenous Stopped 09/01/16 1233)  sodium chloride 0.9 % bolus 1,000 mL (0 mLs Intravenous Stopped 09/01/16 1233)     Initial Impression / Assessment and Plan / ED Course  I have reviewed the triage vital signs and the nursing notes.  Pertinent labs & imaging results that were available during my care of the patient were reviewed by me and considered in my medical decision making (see chart for details).     51 year old with past medical history of asthma here with acute onset of severe shortness of breath. On arrival, patient markedly tachypnea, with silent chest and minimal air movement. He was placed on BiPAP immediately with continuous nebulizer treatment, magnesium, and Solu-Medrol. EKG is nonischemic. I suspect acute, severe asthma exacerbation. He does seem to be slightly improving while watched in the room, and will continue to monitor. Labs are pending. Chest x-ray pending.  Chest x-ray is clear. Blood gas is concerning given his respiratory acidosis. However, on reassessment, the patient states he feels improved and is awake, alert, and in no distress. He is moving better air with diffuse wheezing, likely due to increased air movement. Do not feel that he needs intubation at this time but will consult the intensivist for admission. Patient in agreement. No sputum production or signs of pneumonia to suggest indication for antibiotics.  Final Clinical Impressions(s) / ED Diagnoses   Final diagnoses:  Acute respiratory failure with hypoxia and hypercapnia (HCC)  Severe persistent asthma with exacerbation    New Prescriptions New Prescriptions   No medications on file     Duffy Bruce, MD 09/01/16 1523

## 2016-09-01 NOTE — Progress Notes (Signed)
Patient arrived from ED and placed on monitor and vital signs obtained. Will monitor patient. Karren Newland, Bettina Gavia rN

## 2016-09-01 NOTE — ED Notes (Signed)
Attempted to call report x 1 to 4 [place]. 

## 2016-09-01 NOTE — ED Notes (Signed)
Admission Team - NP Eddie Dibbles at the bedside

## 2016-09-01 NOTE — Progress Notes (Signed)
eLink Physician-Brief Progress Note Patient Name: Alexander Reeves DOB: 11-08-1965 MRN: [id number]   Date of Service  09/01/2016  HPI/Events of Note  No distres  Off bipap  Add diet  eICU Interventions       Intervention Category Minor Interventions: Routine modifications to care plan (e.g. PRN medications for pain, fever)  Raylene Miyamoto. 09/01/2016, 7:19 PM

## 2016-09-01 NOTE — H&P (Signed)
Name: Alexander Reeves MRN: [id number] DOB: November 11, 1965    ADMISSION DATE:  09/01/2016 CONSULTATION DATE:  8/20  REFERRING MD :  Dr. Ellender Hose EDP  CHIEF COMPLAINT:  Dyspnea  HISTORY OF PRESENT ILLNESS:  [name] sign language interpreter used for history taking. 51 year old male with past history as below, which is significant for asthma, deafness, thyroid disease, and GERD. He presented to [facility] emergency Department 8/20 with complaints of dyspnea which is pending progressively worse over the past 2 weeks, however, the morning of presentation he developed significant chest tightness which caused him to present to the emergency room. Initial ABG concerning for respiratory acidosis and he was placed on BiPAP with significant improvement. He has never been intubated. He was treated for a presumed asthma exacerbation with IV steroids, continuous albuterol treatment, and IV magnesium.  He recently moved from [place] to [place] and since that time has required the emergency department/hospitalization 6 times. He notes that prior to moving his asthma was under good control and is concerned that this sudden changes due to environmental change. Symptoms become much worse in the heat and he was 82. He is also concerned that the dust or potentially mold that his new house was causing frequent exacerbations. He tells me that he takes his maintenance medications every day, but some things he says makes me believe that this is not entirely true, "I use my nebulizer first thing in the morning before I walk to the store and take my advair with me in case." He is becoming frustrated that he is required hospitalization so frequently. Wants to be on prednisone all the time, because its the only time recently that he has felt good.   SIGNIFICANT EVENTS  7/29 > 8/1 admit for asthma.   STUDIES:    PAST MEDICAL HISTORY :   has a past medical history of Arthritis; Asthma; Deaf; GERD (gastroesophageal  reflux disease); and Thyroid disease.  has a past surgical history that includes No past surgeries. Prior to Admission medications   Medication Sig Start Date End Date Taking? Authorizing Provider  albuterol (PROAIR HFA) 108 (90 Base) MCG/ACT inhaler Inhale 1-2 puffs into the lungs every 6 (six) hours as needed for wheezing or shortness of breath. Please give ProAir brand specifically 08/13/16   Colbert Ewing, MD  Fluticasone-Salmeterol (ADVAIR DISKUS) 250-50 MCG/DOSE AEPB Inhale 1 puff into the lungs 2 (two) times daily. 08/13/16   Colbert Ewing, MD  ipratropium-albuterol (DUONEB) 0.5-2.5 (3) MG/3ML SOLN Take 3 mLs by nebulization every 6 (six) hours as needed. 08/13/16   Colbert Ewing, MD  omeprazole (PRILOSEC) 40 MG capsule Take 1 capsule (40 mg total) by mouth daily. 08/13/16   Colbert Ewing, MD   Allergies  Allergen Reactions  . Penicillins Anaphylaxis    FAMILY HISTORY:  family history includes Heart disease in his father. SOCIAL HISTORY:  reports that he has been smoking Cigarettes.  He has never used smokeless tobacco. He reports that he drinks alcohol. He reports that he uses drugs, including Marijuana.  REVIEW OF SYSTEMS:   Bolds are positive  Constitutional: weight loss, gain, night sweats, Fevers, chills, fatigue .  HEENT: headaches, Sore throat, sneezing, nasal congestion, post nasal drip, Difficulty swallowing, Tooth/dental problems, visual complaints visual changes, ear ache CV:  chest pain, tightness, Orthopnea, PND, swelling in lower extremities, dizziness, palpitations, syncope.  GI  heartburn, indigestion, abdominal pain, nausea, vomiting, diarrhea, change in bowel habits, loss of appetite, bloody stools.  Resp: cough, productive:, hemoptysis, dyspnea, chest pain,  pleuritic.  Skin: rash or itching or icterus GU: dysuria, change in color of urine, urgency or frequency. flank pain, hematuria  MS: joint pain or swelling. decreased range of motion  Psych: change in mood or  affect. depression or anxiety.  Neuro: difficulty with speech, weakness, numbness, ataxia    SUBJECTIVE:   VITAL SIGNS: Temp:  [96.6 F (35.9 C)] 96.6 F (35.9 C) (08/20 0949) Pulse Rate:  [100-110] 104 (08/20 1104) Resp:  [17-21] 21 (08/20 1104) BP: (122-141)/(88-96) 132/88 (08/20 1104) SpO2:  [99 %-100 %] 100 % (08/20 1104) FiO2 (%):  [40 %] 40 % (08/20 1026) Weight:  [72.6 kg (160 lb)] 72.6 kg (160 lb) (08/20 0957)  PHYSICAL EXAMINATION: General:  Middle aged adult male in NAD on BiPAP Neuro:  Alert, oriented, non-focal HEENT:  [place]/AT, PERRL, no JVD Cardiovascular:  RRR, no MRG Lungs:  Clear, minimal wheeze Abdomen:  Soft, non-tender, non-distended Musculoskeletal:  No acute deformity or ROM limitation Skin:  Grossly intact   Recent Labs Lab 09/01/16 0951  NA 136  K 4.4  CL 106  CO2 22  BUN 13  CREATININE 1.09  GLUCOSE 198*    Recent Labs Lab 09/01/16 0951  HGB 13.6  HCT 42.9  WBC 6.2  PLT 144*   Dg Chest Port 1 View  Result Date: 09/01/2016 CLINICAL DATA:  Shortness of breath. EXAM: PORTABLE CHEST 1 VIEW COMPARISON:  08/06/2016 . FINDINGS: EKG leads noted over the chest . Density of the right upper lobe most likely related to EKG lead . Mediastinum and hilar structures normal. Lungs are clear of infiltrate. No pleural effusion or pneumothorax. Lung bases not completely imaged. Thoracic spine scoliosis. No acute bony abnormality. IMPRESSION: No acute cardiopulmonary disease. Electronically Signed   By: Marcello Moores  Register   On: 09/01/2016 10:11    ASSESSMENT / PLAN:  Acute asthma exacerbation with hypercarbic failure: 5th presentation this year. Recently moved from [place] and this is when symptoms started. He is concerned that environmental components are a big factor and he may be right. I think medication compliance is also a component. Tolerating BiPAP well and subjectively feels better.  - Continue BiPAP for now - Repeat ABG - Holding home Advair - Scheduled  Duoneb and PRN albuterol - IV solumedrol 60mg  q 8 hours. - Start loratadine  - At risk for intubation initially, but improving - Would benefit from outpatient pulmonary evaluation.   Thyroid disease (no associated home meds listed) - Check TSH  GERD - Continue home PPI  Hyperglycemia: steroid induced.  - may need to add SSI if remains elevated  Global VTE ppx: SCDs and early ambulation SUP: Protonix PO Code: Full Dispo: SDU Update: Patient updated in ED 8/20  Georgann Housekeeper, AGACNP-BC [facility] Pulmonology/Critical Care Pager [phone] or [phone]  09/01/2016 11:32 AM

## 2016-09-01 NOTE — ED Notes (Signed)
RT at the bedside about to perform ABG.

## 2016-09-01 NOTE — ED Triage Notes (Signed)
Per [facility], Pt is coming from home with complaints of respiratory distress and chest tightness that started yesterday. Pt reports mold and dust in his house. He was trying to remove and reports that the Respiratory distress became worse. Pt has HX of COPD and Asthma.   Vitals per EMS: 186/106, 108 HR, 96%   During Transport: Albuterol 5 mg, Atrovent 0.5 mg, 125 mg of Solumedrol, 1 Gram Mag Sulfate

## 2016-09-02 DIAGNOSIS — J4551 Severe persistent asthma with (acute) exacerbation: Principal | ICD-10-CM

## 2016-09-02 LAB — CBC
HCT: 43.1 % (ref 39.0–52.0)
HEMOGLOBIN: 14.1 g/dL (ref 13.0–17.0)
MCH: 26.2 pg (ref 26.0–34.0)
MCHC: 32.7 g/dL (ref 30.0–36.0)
MCV: 80.1 fL (ref 78.0–100.0)
Platelets: 162 10*3/uL (ref 150–400)
RBC: 5.38 MIL/uL (ref 4.22–5.81)
RDW: 14.9 % (ref 11.5–15.5)
WBC: 6.6 10*3/uL (ref 4.0–10.5)

## 2016-09-02 LAB — BASIC METABOLIC PANEL
Anion gap: 9 (ref 5–15)
BUN: 14 mg/dL (ref 6–20)
CALCIUM: 9 mg/dL (ref 8.9–10.3)
CHLORIDE: 106 mmol/L (ref 101–111)
CO2: 22 mmol/L (ref 22–32)
CREATININE: 1.03 mg/dL (ref 0.61–1.24)
GFR calc Af Amer: >60 mL/min (ref >60)
Glucose, Bld: 127 mg/dL — ABNORMAL HIGH (ref 65–99)
Potassium: 5.1 mmol/L (ref 3.5–5.1)
SODIUM: 137 mmol/L (ref 135–145)

## 2016-09-02 LAB — MAGNESIUM: MAGNESIUM: 2.8 mg/dL — AB (ref 1.7–2.4)

## 2016-09-02 MED ORDER — METHYLPREDNISOLONE SODIUM SUCC 125 MG IJ SOLR
60.0000 mg | Freq: Three times a day (TID) | INTRAMUSCULAR | Status: AC
  Administered 2016-09-02: 60 mg via INTRAVENOUS
  Filled 2016-09-02: qty 2

## 2016-09-02 MED ORDER — IPRATROPIUM-ALBUTEROL 0.5-2.5 (3) MG/3ML IN SOLN
3.0000 mL | Freq: Four times a day (QID) | RESPIRATORY_TRACT | Status: DC
  Administered 2016-09-02: 3 mL via RESPIRATORY_TRACT
  Filled 2016-09-02: qty 3

## 2016-09-02 MED ORDER — PREDNISONE 50 MG PO TABS
50.0000 mg | ORAL_TABLET | Freq: Every day | ORAL | Status: DC
  Administered 2016-09-03 – 2016-09-04 (×2): 50 mg via ORAL
  Filled 2016-09-02: qty 1
  Filled 2016-09-02: qty 2

## 2016-09-02 MED ORDER — LORATADINE 10 MG PO TABS
10.0000 mg | ORAL_TABLET | Freq: Every day | ORAL | Status: DC
  Administered 2016-09-03 – 2016-09-04 (×2): 10 mg via ORAL
  Filled 2016-09-02 (×2): qty 1

## 2016-09-02 MED ORDER — IPRATROPIUM-ALBUTEROL 0.5-2.5 (3) MG/3ML IN SOLN
3.0000 mL | Freq: Three times a day (TID) | RESPIRATORY_TRACT | Status: DC
  Administered 2016-09-03: 3 mL via RESPIRATORY_TRACT
  Filled 2016-09-02: qty 3

## 2016-09-02 NOTE — Progress Notes (Addendum)
Per Insurance check - pt's coverage under new card starts Oct. 1- will need prior auth for inhalers-  Please call - [phone]- for pre-auth  # 10  [facility] # [facility] PREFERRED RX # [phone]   2. SINGULAIR  10 MG DAILY  COVER - NO AND NONE FORMULARY  PRIOR APPROVAL -YES # [phone]   2, ADVAIR 250-50 DISKUS  COVER- NO  PRIOR APPROVAL- YES # [phone]   3. [place], Symbicort COVER- NO PRIOR APPROVAL- YES- [phone]  4. ALBUTEROL HFA 108-MCG INHALER  COVER- YES  CO-PAY $ 5.50  TIER- 2 DRUG  PRIOR APPROVAL- YES # [id number]   AND TO DETERMINE PART- B OR PART-D   PHARMACY : [facility]

## 2016-09-02 NOTE — Care Management Note (Addendum)
Case Management Note Marvetta Gibbons RN, BSN Unit 4E-Case Manager [phone]  Patient Details  Name: Alexander Reeves MRN: [id number] Date of Birth: 08-20-1965  Subjective/Objective:  Pt admitted with asthma exacerbation                 Pt is deaf  Action/Plan: PTA pt lived at home with family- independent- on last admission pt was sent home with home nebulizer through [facility]- pt has f/u with [facility] -09/17/16 at 2pm with Domenica Fail PA- for primary care needs. Call made to clinic to confirm. Pt is able to use [facility] pharmacy for reduced cost on medications, as this is a sister clinic. Met with pt along with Pulmonary MD and PA- with assistance from sign language interpreter at bedside- discussion had on medication regiment - will look into copay cost of inhalers- per pt he has medication coverage through [facility]- part D- and just got a new card in the mail. He does however report that he has had difficulty with medication cost since moving here from [place]. Will submit for insurance benefit check on Breo, Dulera, Symbicort, Advair, singular- pt does not qualify for [facility] assistance as he has medicare mediation coverage.  Per Pt he would prefer to use [facility] pharmacy on [address].  CM to continue to follow for d/c needs.   Expected Discharge Date:  09/03/16               Expected Discharge Plan:  Home/Self Care  In-House Referral:     Discharge planning Services  CM Consult, Medication Assistance  Post Acute Care Choice:    Choice offered to:     DME Arranged:    DME Agency:     HH Arranged:    HH Agency:     Status of Service:  In process, will continue to follow  If discussed at Long Length of Stay Meetings, dates discussed:    Discharge Disposition:   Additional Comments:  Dawayne Patricia, RN 09/02/2016, 2:46 PM

## 2016-09-02 NOTE — Progress Notes (Signed)
Name: Alexander Reeves MRN: [id number] DOB: 07-24-1965    ADMISSION DATE:  09/01/2016 CONSULTATION DATE:  8/20  REFERRING MD :  Dr. Ellender Hose EDP  CHIEF COMPLAINT:  Dyspnea  HISTORY OF PRESENT ILLNESS:  [name] sign language interpreter used for history taking. 51 year old male with past history as below, which is significant for asthma, deafness, thyroid disease, and GERD. He presented to [facility] emergency Department 8/20 with complaints of dyspnea which is pending progressively worse over the past 2 weeks, however, the morning of presentation he developed significant chest tightness which caused him to present to the emergency room. Initial ABG concerning for respiratory acidosis and he was placed on BiPAP with significant improvement. He has never been intubated. He was treated for a presumed asthma exacerbation with IV steroids, continuous albuterol treatment, and IV magnesium.  He recently moved from [place] to [place] and since that time has required the emergency department/hospitalization 6 times. He notes that prior to moving his asthma was under good control and is concerned that this sudden changes due to environmental change. Symptoms become much worse in the heat and he was 80. He is also concerned that the dust or potentially mold that his new house was causing frequent exacerbations. He tells me that he takes his maintenance medications every day, but some things he says makes me believe that this is not entirely true, "I use my nebulizer first thing in the morning before I walk to the store and take my advair with me in case." He is becoming frustrated that he is required hospitalization so frequently. Wants to be on prednisone all the time, because its the only time recently that he has felt good.   SIGNIFICANT EVENTS  7/29 > 8/1 admit for asthma.   STUDIES:   SUBJECTIVE: a little better today. breathing still labored.  VITAL SIGNS: Temp:  [97.7 F (36.5 C)-98.9  F (37.2 C)] 97.7 F (36.5 C) (08/21 1106) Pulse Rate:  [87-110] 99 (08/21 1106) Resp:  [14-26] 22 (08/21 1106) BP: (110-139)/(70-95) 120/87 (08/21 1106) SpO2:  [94 %-100 %] 94 % (08/21 1106) Weight:  [70.8 kg (156 lb)] 70.8 kg (156 lb) (08/20 1850)  PHYSICAL EXAMINATION: General:  Middle aged adult male in NAD in bed Neuro:  Alert, oriented, non-focal HEENT:  [place]/AT, PERRL, no JVD Cardiovascular:  RRR, no MRG Lungs:  Minimal wheeze.  Abdomen:  Soft, non-tender, non-distended Musculoskeletal:  No acute deformity or ROM limitation Skin:  Grossly intact   Recent Labs Lab 09/01/16 0951 09/02/16 0258  NA 136 137  K 4.4 5.1  CL 106 106  CO2 22 22  BUN 13 14  CREATININE 1.09 1.03  GLUCOSE 198* 127*    Recent Labs Lab 09/01/16 0951 09/02/16 0258  HGB 13.6 14.1  HCT 42.9 43.1  WBC 6.2 6.6  PLT 144* 162   Dg Chest Port 1 View  Result Date: 09/01/2016 CLINICAL DATA:  Shortness of breath. EXAM: PORTABLE CHEST 1 VIEW COMPARISON:  08/06/2016 . FINDINGS: EKG leads noted over the chest . Density of the right upper lobe most likely related to EKG lead . Mediastinum and hilar structures normal. Lungs are clear of infiltrate. No pleural effusion or pneumothorax. Lung bases not completely imaged. Thoracic spine scoliosis. No acute bony abnormality. IMPRESSION: No acute cardiopulmonary disease. Electronically Signed   By: Marcello Moores  Register   On: 09/01/2016 10:11    ASSESSMENT / PLAN:  Acute asthma exacerbation with hypercarbic failure: 5th presentation this year. Recently moved from [place]  and this is when symptoms started. He is concerned that environmental components are a big factor and he may be right. I think medication compliance is also a component. Tolerating BiPAP well and subjectively feels better.  - DC BiPAP - Scheduled Duoneb and PRN albuterol - DC budesonide as on systemic steroids - Transition to prednisone 50mg  8/22 - loratadine and singulair - Case manager working to  find most accessible ICS/LABA combo - Will need pulmonary follow up on discharge. Please call [employer] [facility] [employer] to arrange interpreter for time of follow up appt. [phone]  - He will need very specific easy to understand instructions drawn out for inhaler use upon discharge  Thyroid disease (no associated home meds listed) - TSH 0.7  GERD - Continue home PPI  Hyperglycemia: steroid induced.  - may need to add SSI if remains elevated  Global VTE ppx: SCDs and early ambulation SUP: Protonix PO Diet: Reg Code: Full Dispo: Tx to Med-surg Update: Multidisciplinary meeting 8/21  Georgann Housekeeper, AGACNP-BC [place] [facility] Pager [phone] or [phone]  09/02/2016 2:14 PM  Attending Note:  I have examined patient, reviewed labs, studies and notes. I have discussed the case with Jaclynn Guarneri, and I agree with the data and plans as amended above. 51 year old man with long-standing asthma that appears to have worsening control since he moved to [place] from [place]. Good discussion with him today with the assistance of sign language interpreter. It sounds like some of his worsening is related to environmental exposures. We have tried to add an allergy regimen that will include Singulair and also loratadine. Also sounds like his compliance with medications changed after he moved. He reported that he had been on Advair that he took fairly reliably, had to stop this when he moved and never was reliably on a controller medication again. He has used albuterol in inhaler and nebulized form. He would clearly benefit from being back on an ICS/LABA, which ever one is on the formulary for [facility]. We will work on getting this for him. He will also need outpatient follow-up with [place] in the pulmonary office.   Baltazar Apo, MD, PhD 09/02/2016, 2:42 PM [facility] [phone] or if no answer [phone]

## 2016-09-03 MED ORDER — MOMETASONE FURO-FORMOTEROL FUM 100-5 MCG/ACT IN AERO
2.0000 | INHALATION_SPRAY | Freq: Two times a day (BID) | RESPIRATORY_TRACT | Status: DC
  Administered 2016-09-03 – 2016-09-04 (×2): 2 via RESPIRATORY_TRACT
  Filled 2016-09-03: qty 8.8

## 2016-09-03 NOTE — Progress Notes (Signed)
Alexander Reeves, respiratory clarified that the CIPAP machine used at bedside is the one that can be used on 6 N. Transferring patient now to 6N via bed, accompanied by Abbey Chatters. Director of [place] and [place], [place]. Patient is alert, no signs of distress, spouse at bedside. Notified receiving nurse on 6N, patient is enroute.

## 2016-09-03 NOTE — Progress Notes (Signed)
Placed patient on Bipap for the night with IPAP set at 1cm and EPAP set at 6cm. Oxygen set at 3lpm with  SP02-97%

## 2016-09-03 NOTE — Progress Notes (Signed)
Called report to 6n, received by Ben,RN. Patient uses  bipap at night, clarifying if 6N can take this patient or not. Notified respiratory who says this floor can take BIPAP but will call unit to speak with charge nurse.

## 2016-09-03 NOTE — Progress Notes (Signed)
Name: Alexander Reeves MRN: [id number] DOB: 1965-11-11    ADMISSION DATE:  09/01/2016 CONSULTATION DATE:  8/20  REFERRING MD :  Dr. Ellender Reeves EDP  CHIEF COMPLAINT:  Dyspnea  HISTORY OF PRESENT ILLNESS:  [name] sign language interpreter used for history taking. 51 year old male with past history as below, which is significant for asthma, deafness, thyroid disease, and GERD. He presented to [facility] emergency Department 8/20 with complaints of dyspnea which is pending progressively worse over Alexander past 2 weeks, however, Alexander morning of presentation he developed significant chest tightness which caused him to present to [place] emergency room. Initial ABG concerning for respiratory acidosis and he was placed on BiPAP with significant improvement. He has never been intubated. He was treated for a presumed asthma exacerbation with IV steroids, continuous albuterol treatment, and IV magnesium.  He recently moved from [place] and since that time has required [place] emergency department/hospitalization 6 times. He notes that prior to moving his asthma was under good control and is concerned that this sudden changes due to environmental change. Symptoms become much worse in Alexander heat and he was 80. He is also concerned that Alexander dust or potentially mold that his [place] house was causing frequent exacerbations. He tells me that he takes his maintenance medications every day, but some things he says makes me believe that this is not entirely true, "I use my nebulizer first thing in [place] morning before I walk to [place] store and take my advair with me in case." He is becoming frustrated that he is required hospitalization so frequently. Wants to be on prednisone all Alexander time, because its Alexander only time recently that he has felt good.   SIGNIFICANT EVENTS  7/29 > 8/1 admit for asthma.   STUDIES:   SUBJECTIVE:  Woke up very SOB this am.  Slightly better now.   VITAL SIGNS: Temp:  [97.4 F (36.3  C)-98.5 F (36.9 C)] 98.5 F (36.9 C) (08/22 0838) Pulse Rate:  [27-101] 27 (08/22 0838) Resp:  [18-27] 22 (08/22 0838) BP: (108-134)/(67-84) 108/67 (08/22 0838) SpO2:  [95 %-100 %] 99 % (08/22 0838) Weight:  [72.2 kg (159 lb 3.2 oz)] 72.2 kg (159 lb 3.2 oz) (08/22 0927)  PHYSICAL EXAMINATION: General:  Middle aged adult male in NAD in bed, wife at bedside  Neuro:  Alert, oriented, non-focal HEENT:  [place]/AT, PERRL, no JVD Cardiovascular:  RRR, no MRG Lungs:  resps even non labored on RA, Minimal wheeze.  Abdomen:  Soft, non-tender, non-distended Musculoskeletal:  No acute deformity or ROM limitation Skin:  Grossly intact   Recent Labs Lab 09/01/16 0951 09/02/16 0258  NA 136 137  K 4.4 5.1  CL 106 106  CO2 22 22  BUN 13 14  CREATININE 1.09 1.03  GLUCOSE 198* 127*    Recent Labs Lab 09/01/16 0951 09/02/16 0258  HGB 13.6 14.1  HCT 42.9 43.1  WBC 6.2 6.6  PLT 144* 162   No results found.  ASSESSMENT / PLAN:  Acute asthma exacerbation with hypercarbic failure: 5th presentation this year. Recently moved from [place] and this is when symptoms started. This is likely multifactorial r/t environmental/allergy triggers as well as poor insurance coverage and inability to afford his medications.  He is improving slowly.  Plan -  - will transition to dulera and attempt pre-authorization for dulera on d/c  - PO prednisone with slow taper  - loratadine and singulair - Case manager working to find most accessible ICS/LABA combo - Will  need pulmonary follow up on discharge. Please call [employer] [facility] [place] [facility] to arrange interpreter for time of follow up appt. [phone]  - He will need very specific easy to understand instructions drawn out for inhaler use upon discharge  Thyroid disease (no associated home meds listed) - TSH 0.7  GERD - Continue home PPI  Hyperglycemia: steroid induced.  - may need to add SSI if remains elevated  Global VTE ppx: SCDs and early  ambulation SUP: Protonix PO Diet: Reg Code: Full Dispo: Tx to Med-surg Update: Multidisciplinary meeting 8/21  Likely d/c home in am. Case management will arrange interpreter for d/c instructions.   [place] Madrid, NP 09/03/2016  12:30 PM Pager: [phone] or [phone]   Attending Note:  I have examined patient, reviewed labs, studies and notes. I have discussed Alexander case with [place] Reeves, and I agree with Alexander data and plans as amended above. 51 yo man, hx asthma, hearing impaired. Here with an exacerbation. On my eval today he is awake and alert. Not sure whether [place] Reeves has been beneficial yet. No wheeze on exam. We checked his insurance and he does not have prescription drug coverage until 10/13/16. Until then we will try to supply [place]. Hopefully it will be formulary (we cannot get access to covered meds until 10/1).   [place] Apo, MD, PhD 09/03/2016, 3:16 PM [facility] [phone] or if no answer [phone]

## 2016-09-03 NOTE — Progress Notes (Signed)
Pt transferred to unit from 4east at 1845hrs

## 2016-09-03 NOTE — Progress Notes (Signed)
Assumed care of patient after receiving report from [place], [place]. Patient resting in bed no s/sx of distress. Spouse at bedside.

## 2016-09-04 MED ORDER — LORATADINE 10 MG PO TABS: 10.0000 mg | ORAL_TABLET | Freq: Every day | ORAL | Status: DC

## 2016-09-04 MED ORDER — PREDNISONE 10 MG PO TABS: ORAL_TABLET | ORAL | 0 refills | Status: DC

## 2016-09-04 MED ORDER — MOMETASONE FURO-FORMOTEROL FUM 100-5 MCG/ACT IN AERO: 2.0000 | INHALATION_SPRAY | Freq: Two times a day (BID) | RESPIRATORY_TRACT | 6 refills | Status: DC

## 2016-09-04 MED ORDER — ALBUTEROL SULFATE HFA 108 (90 BASE) MCG/ACT IN AERS
2.0000 | INHALATION_SPRAY | RESPIRATORY_TRACT | Status: DC | PRN
Start: 2016-09-04 — End: 2016-09-04
  Filled 2016-09-04: qty 6.7

## 2016-09-04 MED ORDER — MONTELUKAST SODIUM 10 MG PO TABS: 10.0000 mg | ORAL_TABLET | Freq: Every day | ORAL | 6 refills | Status: DC

## 2016-09-04 MED ORDER — ALBUTEROL SULFATE HFA 108 (90 BASE) MCG/ACT IN AERS: 2.0000 | INHALATION_SPRAY | RESPIRATORY_TRACT | 6 refills | Status: DC | PRN

## 2016-09-04 MED ORDER — ALBUTEROL SULFATE HFA 108 (90 BASE) MCG/ACT IN AERS: 2.0000 | INHALATION_SPRAY | RESPIRATORY_TRACT | Status: DC | PRN

## 2016-09-04 MED FILL — DULERA 100 MCG/5 MCG INH: 100-5 | 30 days supply | Qty: 13 | Fill #0

## 2016-09-04 MED FILL — !VENTOLIN HFA INHALER: 108 (90 BAS | 16 days supply | Qty: 18 | Fill #0

## 2016-09-04 MED FILL — ?MONTELUKAST SOD 10MG TAB: 10 | 30 days supply | Qty: 30 | Fill #0

## 2016-09-04 MED FILL — ?PREDNISONE 10 MG TABLET: 10 | 16 days supply | Qty: 40 | Fill #0

## 2016-09-04 NOTE — Discharge Summary (Signed)
Physician Discharge Summary       Patient ID: Alexander Reeves MRN: [id number] DOB/AGE: 1965/09/05 51 y.o.  Admit date: 09/01/2016 Discharge date: 09/04/2016  Discharge Diagnoses:   Asthmatic exacerbation Acute hypercarbic respiratory failure Allergies GERD Thyroid disease.  Deaf  Detailed Hospital Course:  51 year old male w/ sig h/o: deafness, thyroid disease, GERD and asthma. Admitted 8/20 w/ cc: chest tightness and associated shortness of breath leading him to seek out evaluation in the ER. In the ER noted to have respiratory acidosis and was placed on NIPPV.  Of note: ->never intubated ->[place]ly moved [place] . Since that time seen in ER 6 times.  -> exacerbating considerations currently include: environmental changes, possible dust and/or mold exposure in his new home, and trouble with maintenance bronchodilator regimen adherence.  Hospital course.  Admitted to the hospital. Working dx of asthmatic exacerbation and resultant acute hypercarbic respiratory failure. He was admitted to the SDU setting. Therapeutic interventions included: Schedule ICS/LABA Scheduled SABA Systemic steroids Supplemental Oxygen  He also required BIPAP initially.  He made slow but steady improvement during his stay. He was stepped down on steroids and his BD regimen was changed to ICS/LABA and PRN SABA. During his stay it became clear that his understanding of his maintenance regimen vs rescue regimen was not clearly understood by the patient AND that he did not have access to medications due to lack of medical insurance coverage. We spent several episodes utilizing sign language interpreter in effort to help address these issues. He was deemed ready for dc as of 8/23 w/ the following plan of care.    Discharge Plan by active problems  Asthma Plan Continue Dulera. 2 puffs BID Cont rescue Ventolin 2 puffs as needed Continue Claritin  Continue Singular  Continue PPI  ->note anticipate the  following challenges in the out-patient setting: 1) medication adherence due to lack of insurance -have sent him home w/ [facility] Sample. We may need to try to support him w/ samples until he gets coverage (October).  2) home setting w/ possible mold and dust exposure. (a letter was written requesting his land-lord to address these issues). He thinks he may need to re-locate.  3) he is Deaf. Will need interpreter at time of his f/u appointment ([facility] tests/ studies  Consults   Discharge Exam: BP 123/79 (BP Location: Right Arm)   Pulse 69   Temp 98.2 F (36.8 C) (Oral)   Resp 17   Ht 5\' 10"  (1.778 m)   Wt 166 lb 14.4 oz (75.7 kg)   SpO2 97%   BMI 23.95 kg/m   General appearance:  51 Year old  Male, well nourished NAD, currently in acute distress,conversant  Eyes: anicteric sclerae icteric , moist conjunctivae; PERRL, EOMI bilaterally. Mouth:  membranes and no mucosal ulcerations; normal hard and soft palate Neck: Trachea midline; neck supple, no JVD Lungs/chest: CTA, with normal respiratory effort and no intercostal retractions CV: RRR, no MRGs  Abdomen: Soft, non-tender; no masses or HSM Extremities: No peripheral edema or extremity lymphadenopathy Skin: Normal temperature, turgor and texture; no rash, ulcers or subcutaneous nodules Psych: Appropriate affect, alert and oriented to person, place and time   Labs at discharge Lab Results  Component Value Date   CREATININE 1.03 09/02/2016   BUN 14 09/02/2016   NA 137 09/02/2016   K 5.1 09/02/2016   CL 106 09/02/2016   CO2 22 09/02/2016   Lab Results  Component Value Date  WBC 6.6 09/02/2016   HGB 14.1 09/02/2016   HCT 43.1 09/02/2016   MCV 80.1 09/02/2016   PLT 162 09/02/2016   Lab Results  Component Value Date   ALT 30 06/14/2016   AST 26 06/14/2016   ALKPHOS 44 06/14/2016   BILITOT 0.7 06/14/2016   No results found for: INR, PROTIME  Current radiology studies No results  found.  Disposition:  01-Home or Self Care  Discharge Instructions    Diet - low sodium heart healthy    Complete by:  As directed    Increase activity slowly    Complete by:  As directed      Allergies as of 09/04/2016      Reactions   Cheese Shortness Of Breath, Swelling   Swelling of the throat   Eggs Or Egg-derived Products Shortness Of Breath, Swelling   Swelling of the throat   Penicillins Anaphylaxis   Has patient had a PCN reaction causing immediate rash, facial/tongue/throat swelling, SOB or lightheadedness with hypotension: Yes Has patient had a PCN reaction causing severe rash involving mucus membranes or skin necrosis: Unknown Has patient had a PCN reaction that required hospitalization: Unknown Has patient had a PCN reaction occurring within the last 10 years: Unknown If all of the above answers are "NO", then may proceed with Cephalosporin use.      Medication List    STOP taking these medications   Fluticasone-Salmeterol 250-50 MCG/DOSE Aepb Commonly known as:  ADVAIR DISKUS   ipratropium-albuterol 0.5-2.5 (3) MG/3ML Soln Commonly known as:  DUONEB     TAKE these medications   albuterol 108 (90 Base) MCG/ACT inhaler Commonly known as:  PROVENTIL HFA;VENTOLIN HFA Inhale 2 puffs into the lungs every 4 (four) hours as needed for wheezing or shortness of breath. What changed:  how much to take  when to take this  additional instructions   loratadine 10 MG tablet Commonly known as:  CLARITIN Take 1 tablet (10 mg total) by mouth daily.   mometasone-formoterol 100-5 MCG/ACT Aero Commonly known as:  DULERA Inhale 2 puffs into the lungs 2 (two) times daily.   montelukast 10 MG tablet Commonly known as:  SINGULAIR Take 1 tablet (10 mg total) by mouth at bedtime.   omeprazole 40 MG capsule Commonly known as:  PRILOSEC Take 1 capsule (40 mg total) by mouth daily.   predniSONE 10 MG tablet Commonly known as:  DELTASONE Take 4 tabs  daily with food x  4 days, then 3 tabs daily x 4 days, then 2 tabs daily x 4 days, then 1 tab daily x4 days then stop. #40            Discharge Care Instructions        Start     Ordered   09/05/16 0000  loratadine (CLARITIN) 10 MG tablet  Daily    Question:  Supervising Provider  Answer:  Chesley Mires   09/04/16 1253   09/04/16 0000  albuterol (PROVENTIL HFA;VENTOLIN HFA) 108 (90 Base) MCG/ACT inhaler  Every 4 hours PRN    Question:  Supervising Provider  Answer:  Chesley Mires   09/04/16 1253   09/04/16 0000  mometasone-formoterol (DULERA) 100-5 MCG/ACT AERO  2 times daily    Question:  Supervising Provider  Answer:  Chesley Mires   09/04/16 1253   09/04/16 0000  montelukast (SINGULAIR) 10 MG tablet  Daily at bedtime    Question:  Supervising Provider  Answer:  Chesley Mires   09/04/16 2505  09/04/16 0000  predniSONE (DELTASONE) 10 MG tablet    Question:  Supervising Provider  Answer:  Chesley Mires   09/04/16 2633   09/04/16 0000  Increase activity slowly     09/04/16 1253   09/04/16 0000  Diet - low sodium heart healthy     09/04/16 1253     Follow-up Information    [facility] Follow up on 09/17/2016.   Why:  f/u with Domenica Fail PA- at 2:00pm  Contact information: [place] [address] [phone]       Donita Brooks, NP Follow up on 09/04/2016.   Specialty:  Pulmonary Disease Contact information: [place] [address] [phone]        Melvenia Needles, NP Follow up on 09/23/2016.   Specialty:  Pulmonary Disease Why:  at 2pm  Contact information: [address] [place] [address] [phone]           Discharged Condition: good  Physician Statement:   The Patient was personally examined, the discharge assessment and plan has been personally reviewed and I agree with ACNP Jenisse Vullo's assessment and plan. 60 minutes of time have been dedicated to discharge assessment,  planning and discharge instructions.   Signed: Clementeen Graham 09/04/2016, 2:49 PM

## 2016-09-04 NOTE — Progress Notes (Signed)
   To whom it may concern,  This is in regards to Mr Edmundo Tedesco Dob: [birth year]  We follow Mr Heber for his diagnosis of asthma. He has brought to our attention his concern about both mold and dust in his home. Exposure to these allergins can be known causes of exacerbation of his asthma. We would appreciate any an all efforts in helping [place] to eradicate these potential health risks as soon as possible.   Thank you for your assistance in providing excellent health care!  Erick Colace ACNP-BC [place] [phone]

## 2016-09-04 NOTE — Progress Notes (Signed)
Discharge home. Home discharge instruction given with the help of the interpreter. No question verbalized.

## 2016-09-17 ENCOUNTER — Inpatient Hospital Stay: Payer: Medicare Other | Admitting: Pulmonary Disease

## 2016-09-17 ENCOUNTER — Inpatient Hospital Stay (INDEPENDENT_AMBULATORY_CARE_PROVIDER_SITE_OTHER): Payer: Medicare Other | Admitting: Physician Assistant

## 2016-09-23 ENCOUNTER — Encounter: Payer: Self-pay | Admitting: Adult Health

## 2016-09-23 ENCOUNTER — Ambulatory Visit (INDEPENDENT_AMBULATORY_CARE_PROVIDER_SITE_OTHER): Payer: Medicare Other | Admitting: Adult Health

## 2016-09-23 DIAGNOSIS — J4551 Severe persistent asthma with (acute) exacerbation: Secondary | ICD-10-CM

## 2016-09-23 MED ORDER — MOMETASONE FURO-FORMOTEROL FUM 100-5 MCG/ACT IN AERO: 2.0000 | INHALATION_SPRAY | Freq: Two times a day (BID) | RESPIRATORY_TRACT | 0 refills | Status: DC

## 2016-09-23 NOTE — Progress Notes (Signed)
@Patient  ID: Alexander Reeves, male    DOB: 1965/12/30, 51 y.o.   MRN: [id number]  Chief Complaint  Patient presents with  . Follow-up    post hosp    Referring provider: Tawny Asal  HPI: 51 year old with history of deafness asthma seen for pulmonary consult for asthma exacerbation. 09/01/2016  09/23/2016 Follow up : Asthma (Visit done through interpretor)  Patient returns for a post hospital follow-up. Patient was admitted August 20 through August 23 for an asthma exacerbation with acute hypercarbic respiratory failure. Patient presented with progressive shortness of breath to the emergency room. He was noted to be in respiratory acidosis and placed on BiPAP support. Patient was treated with nebulized bronchodilators and IV steroids. Patient has frequent hospitalizations and emergency room visits. Patient does not have insurance and therefore cannot continue on maintenance medications. He is not familiar with is inhalers and does not know the names of them. Spent time going over controller inhaler vs emergency inhaler . Called pharmacy to verify insurance coverage as pt thought he had insurance but pharmacy says it is not active. Explained this to patient.  Since discharge. Patient says he is feeling improved with decreased cough, shortness of breath and wheezing. He denies any current wheezing or cough.  Allergies  Allergen Reactions  . Cheese Shortness Of Breath and Swelling    Swelling of the throat  . Eggs Or Egg-Derived Products Shortness Of Breath and Swelling    Swelling of the throat  . Penicillins Anaphylaxis    Has patient had a PCN reaction causing immediate rash, facial/tongue/throat swelling, SOB or lightheadedness with hypotension: Yes Has patient had a PCN reaction causing severe rash involving mucus membranes or skin necrosis: Unknown Has patient had a PCN reaction that required hospitalization: Unknown Has patient had a PCN reaction occurring within the last  10 years: Unknown If all of the above answers are "NO", then may proceed with Cephalosporin use.     Immunization History  Administered Date(s) Administered  . Tdap 06/02/2016    Past Medical History:  Diagnosis Date  . Arthritis   . Asthma   . Deaf   . GERD (gastroesophageal reflux disease)   . Thyroid disease     Tobacco History: History  Smoking Status  . Current Some Day Smoker  . Types: Cigarettes  Smokeless Tobacco  . Never Used   Ready to quit: No Counseling given: Yes   Outpatient Encounter Prescriptions as of 09/23/2016  Medication Sig  . albuterol (PROVENTIL HFA;VENTOLIN HFA) 108 (90 Base) MCG/ACT inhaler Inhale 2 puffs into the lungs every 4 (four) hours as needed for wheezing or shortness of breath.  . loratadine (CLARITIN) 10 MG tablet Take 1 tablet (10 mg total) by mouth daily.  . mometasone-formoterol (DULERA) 100-5 MCG/ACT AERO Inhale 2 puffs into the lungs 2 (two) times daily.  . montelukast (SINGULAIR) 10 MG tablet Take 1 tablet (10 mg total) by mouth at bedtime.  [name] omeprazole (PRILOSEC) 40 MG capsule Take 1 capsule (40 mg total) by mouth daily.  . [DISCONTINUED] predniSONE (DELTASONE) 10 MG tablet Take 4 tabs  daily with food x 4 days, then 3 tabs daily x 4 days, then 2 tabs daily x 4 days, then 1 tab daily x4 days then stop. #40   No facility-administered encounter medications on file as of 09/23/2016.      Review of Systems  Constitutional:   No  weight loss, night sweats,  Fevers, chills, fatigue, or  lassitude.  HEENT:   No headaches,  Difficulty swallowing,  Tooth/dental problems, or  Sore throat,                No sneezing, itching, ear ache, nasal congestion, post nasal drip,   CV:  No chest pain,  Orthopnea, PND, swelling in lower extremities, anasarca, dizziness, palpitations, syncope.   GI  No heartburn, indigestion, abdominal pain, nausea, vomiting, diarrhea, change in bowel habits, loss of appetite, bloody stools.   Resp: No  shortness of breath with exertion or at rest.  No excess mucus, no productive cough,  No non-productive cough,  No coughing up of blood.  No change in color of mucus.  No wheezing.  No chest wall deformity  Skin: no rash or lesions.  GU: no dysuria, change in color of urine, no urgency or frequency.  No flank pain, no hematuria   MS:  No joint pain or swelling.  No decreased range of motion.  No back pain.    Physical Exam  BP 126/76 (BP Location: Left Arm, Cuff Size: Normal)   Pulse 84   Ht 5\' 10"  (1.778 m)   Wt 166 lb (75.3 kg)   SpO2 97%   BMI 23.82 kg/m   GEN: A/Ox3; pleasant , NAD, well nourished    HEENT:  [place]/AT,  EACs-clear, TMs-wnl, NOSE-clear, THROAT-clear, no lesions, no postnasal drip or exudate noted.   NECK:  Supple w/ fair ROM; no JVD; normal carotid impulses w/o bruits; no thyromegaly or nodules palpated; no lymphadenopathy.    RESP  Clear  P & A; w/o, wheezes/ rales/ or rhonchi. no accessory muscle use, no dullness to percussion  CARD:  RRR, no m/r/g, no peripheral edema, pulses intact, no cyanosis or clubbing.  GI:   Soft & nt; nml bowel sounds; no organomegaly or masses detected.   Musco: Warm bil, no deformities or joint swelling noted.   Neuro: alert, no focal deficits noted.    Skin: Warm, no lesions or rashes    Lab Results:   BMET  BNP No results found for: BNP  ProBNP No results found for: PROBNP  Imaging: Dg Chest Port 1 View  Result Date: 09/01/2016 CLINICAL DATA:  Shortness of breath. EXAM: PORTABLE CHEST 1 VIEW COMPARISON:  08/06/2016 . FINDINGS: EKG leads noted over the chest . Density of the right upper lobe most likely related to EKG lead . Mediastinum and hilar structures normal. Lungs are clear of infiltrate. No pleural effusion or pneumothorax. Lung bases not completely imaged. Thoracic spine scoliosis. No acute bony abnormality. IMPRESSION: No acute cardiopulmonary disease. Electronically Signed   By: Marcello Moores  Register   On:  09/01/2016 10:11     Assessment & Plan:   Chronic asthma, severe persistent, with acute exacerbation Advised on smoking cessation  Medication education .  Samples given -to last 6 weeks   Plan  Patient Instructions  Continue on Dulera 2 puffs Twice daily  , rinse after use. This is your controller inhaler .  Use Ventolin 2 puffs every 4hr as needed for wheezing , this is your rescue /emergency inhaler  Follow up with Dr. Lamonte Sakai  In 6-8 weeks with PFT and As needed   Please contact office for sooner follow up if symptoms do not improve or worsen or seek emergency care         Rexene Edison, NP 09/23/2016

## 2016-09-23 NOTE — Assessment & Plan Note (Signed)
Advised on smoking cessation  Medication education .  Samples given -to last 6 weeks   Plan  Patient Instructions  Continue on Dulera 2 puffs Twice daily  , rinse after use. This is your controller inhaler .  Use Ventolin 2 puffs every 4hr as needed for wheezing , this is your rescue /emergency inhaler  Follow up with Dr. Lamonte Sakai  In 6-8 weeks with PFT and As needed   Please contact office for sooner follow up if symptoms do not improve or worsen or seek emergency care

## 2016-09-23 NOTE — Addendum Note (Signed)
Addended by: Parke Poisson E on: 09/23/2016 03:40 PM   Modules accepted: Orders

## 2016-09-23 NOTE — Patient Instructions (Signed)
Continue on Dulera 2 puffs Twice daily  , rinse after use. This is your controller inhaler .  Use Ventolin 2 puffs every 4hr as needed for wheezing , this is your rescue /emergency inhaler  Follow up with Dr. Lamonte Sakai  In 6-8 weeks with PFT and As needed   Please contact office for sooner follow up if symptoms do not improve or worsen or seek emergency care

## 2016-09-24 ENCOUNTER — Encounter (INDEPENDENT_AMBULATORY_CARE_PROVIDER_SITE_OTHER): Payer: Self-pay | Admitting: Physician Assistant

## 2016-09-24 ENCOUNTER — Ambulatory Visit (INDEPENDENT_AMBULATORY_CARE_PROVIDER_SITE_OTHER): Payer: Medicare Other | Admitting: Physician Assistant

## 2016-09-24 VITALS — BP 134/81 | HR 92 | Temp 97.6°F | Wt 170.0 lb

## 2016-09-24 DIAGNOSIS — J45998 Other asthma: Secondary | ICD-10-CM

## 2016-09-24 DIAGNOSIS — J45909 Unspecified asthma, uncomplicated: Secondary | ICD-10-CM

## 2016-09-24 MED ORDER — MONTELUKAST SODIUM 10 MG PO TABS: 10.0000 mg | ORAL_TABLET | Freq: Every day | ORAL | 11 refills | Status: DC

## 2016-09-24 MED ORDER — ALBUTEROL SULFATE HFA 108 (90 BASE) MCG/ACT IN AERS: 2.0000 | INHALATION_SPRAY | RESPIRATORY_TRACT | 11 refills | Status: DC | PRN

## 2016-09-24 MED ORDER — IPRATROPIUM-ALBUTEROL 0.5-2.5 (3) MG/3ML IN SOLN: 3.0000 mL | RESPIRATORY_TRACT | 3 refills | Status: DC | PRN

## 2016-09-24 NOTE — Patient Instructions (Signed)
Asthma Attack Prevention, Adult Although you may not be able to control the fact that you have asthma, you can take actions to prevent episodes of asthma (asthma attacks). These actions include:  Creating a written plan for managing and treating your asthma attacks (asthma action plan).  Monitoring your asthma.  Avoiding things that can irritate your airways or make your asthma symptoms worse (asthma triggers).  Taking your medicines as directed.  Acting quickly if you have signs or symptoms of an asthma attack. What are some ways to prevent an asthma attack? Create a plan Work with your health care provider to create an asthma action plan. This plan should include:  A list of your asthma triggers and how to avoid them.  A list of symptoms that you experience during an asthma attack.  Information about when to take medicine and how much medicine to take.  Information to help you understand your peak flow measurements.  Contact information for your health care providers.  Daily actions that you can take to control asthma. Monitor your asthma   To monitor your asthma:  Use your peak flow meter every morning and every evening for 2-3 weeks. Record the results in a journal. A drop in your peak flow numbers on one or more days may mean that you are starting to have an asthma attack, even if you are not having symptoms.  When you have asthma symptoms, write them down in a journal. Avoid asthma triggers   Work with your health care provider to find out what your asthma triggers are. This can be done by:  Being tested for allergies.  Keeping a journal that notes when asthma attacks occur and what may have contributed to them.  Asking your health care provider whether other medical conditions make your asthma worse. Common asthma triggers include:  Dust.  Smoke. This includes campfire smoke and secondhand smoke from tobacco products.  Pet dander.  Trees, grasses or  pollens.  Very cold, dry, or humid air.  Mold.  Foods that contain high amounts of sulfites.  Strong smells.  Engine exhaust and air pollution.  Aerosol sprays and fumes from household cleaners.  Household pests and their droppings, including dust mites and cockroaches.  Certain medicines, including NSAIDs. Once you have determined your asthma triggers, take steps to avoid them. Depending on your triggers, you may be able to reduce the chance of an asthma attack by:  Keeping your home clean. Have someone dust and vacuum your home for you 1 or 2 times a week. If possible, have them use a high-efficiency particulate arrestance (HEPA) vacuum.  Washing your sheets weekly in hot water.  Using allergy-proof mattress covers and casings on your bed.  Keeping pets out of your home.  Taking care of mold and water problems in your home.  Avoiding areas where people smoke.  Avoiding using strong perfumes or odor sprays.  Avoid spending a lot of time outdoors when pollen counts are high and on very windy days.  Talking with your health care provider before stopping or starting any new medicines. Medicines Take over-the-counter and prescription medicines only as told by your health care provider. Many asthma attacks can be prevented by carefully following your medicine schedule. Taking your medicines correctly is especially important when you cannot avoid certain asthma triggers. Even if you are doing well, do not stop taking your medicine and do not take less medicine. Act quickly If an asthma attack happens, acting quickly can decrease how severe   it is and how long it lasts. Take these actions:  Pay attention to your symptoms. If you are coughing, wheezing, or having difficulty breathing, do not wait to see if your symptoms go away on their own. Follow your asthma action plan.  If you have followed your asthma action plan and your symptoms are not improving, call your health care  provider or seek immediate medical care at the nearest hospital. It is important to write down how often you need to use your fast-acting rescue inhaler. You can track how often you use an inhaler in your journal. If you are using your rescue inhaler more often, it may mean that your asthma is not under control. Adjusting your asthma treatment plan may help you to prevent future asthma attacks and help you to gain better control of your condition. How can I prevent an asthma attack when I exercise?   Exercise is a common asthma trigger. To prevent asthma attacks during exercise:  Follow advice from your health care provider about whether you should use your fast-acting inhaler before exercising. Many people with asthma experience exercise-induced bronchoconstriction (EIB). This condition often worsens during vigorous exercise in cold, humid, or dry environments. Usually, people with EIB can stay very active by using a fast-acting inhaler before exercising.  Avoid exercising outdoors in very cold or humid weather.  Avoid exercising outdoors when pollen counts are high.  Warm up and cool down when exercising.  Stop exercising right away if asthma symptoms start. Consider taking part in exercises that are less likely to cause asthma symptoms such as:  Indoor swimming.  Biking.  Walking.  Hiking.  Playing football. This information is not intended to replace advice given to you by your health care provider. Make sure you discuss any questions you have with your health care provider. Document Released: 12/18/2008 Document Revised: 08/31/2015 Document Reviewed: 06/16/2015 Elsevier Interactive Patient Education  2017 [employer]  

## 2016-09-24 NOTE — Progress Notes (Signed)
Subjective:  Patient ID: Alexander Reeves, male    DOB: 08-07-1965  Age: 51 y.o. MRN: [id number]  CC: hospital f/u  HPI  Peder Allums is a 51 y.o. male with a PMH of deafness and asthma.Presents on f/u for hospital admission from 09/01/16 to 09/04/16 for acute hyercarbic respiratory failure. He was discharged on Dulera sample, Ventolin, Claritin, Singular, and PPI. Has had many ED visits and admissions for the same or similar. He has since been evaluated by pulmonology and was advised to continue on ED medications listed above. He is to have a PFT in 6-8 weeks. He is uninsured and unable to afford medications. However, he has been recently approved for Medicare and is awaiting for his coverage to become active. Currently feels well and has not symptoms or complaints.    Outpatient Medications Prior to Visit  Medication Sig Dispense Refill  . albuterol (PROVENTIL HFA;VENTOLIN HFA) 108 (90 Base) MCG/ACT inhaler Inhale 2 puffs into the lungs every 4 (four) hours as needed for wheezing or shortness of breath. 1 Inhaler 6  . loratadine (CLARITIN) 10 MG tablet Take 1 tablet (10 mg total) by mouth daily.    . mometasone-formoterol (DULERA) 100-5 MCG/ACT AERO Inhale 2 puffs into the lungs 2 (two) times daily. 4 Inhaler 0  . montelukast (SINGULAIR) 10 MG tablet Take 1 tablet (10 mg total) by mouth at bedtime. 30 tablet 6  . omeprazole (PRILOSEC) 40 MG capsule Take 1 capsule (40 mg total) by mouth daily. 30 capsule 3  . mometasone-formoterol (DULERA) 100-5 MCG/ACT AERO Inhale 2 puffs into the lungs 2 (two) times daily. 1 Inhaler 6   No facility-administered medications prior to visit.      ROS Review of Systems  Constitutional: Negative for chills, fever and malaise/fatigue.  Eyes: Negative for blurred vision.  Respiratory: Negative for shortness of breath.   Cardiovascular: Negative for chest pain and palpitations.  Gastrointestinal: Negative for abdominal pain and nausea.  Genitourinary:  Negative for dysuria and hematuria.  Musculoskeletal: Negative for joint pain and myalgias.  Skin: Negative for rash.  Neurological: Negative for tingling and headaches.  Psychiatric/Behavioral: Negative for depression. The patient is not nervous/anxious.     Objective:  BP 134/81 (BP Location: Right Arm, Patient Position: Sitting, Cuff Size: Normal)   Pulse 92   Temp 97.6 F (36.4 C) (Oral)   Wt 170 lb (77.1 kg)   SpO2 93%   BMI 24.39 kg/m   BP/Weight 09/24/2016 09/23/2016 04/21/8117  Systolic BP 147 829 562  Diastolic BP 81 76 79  Wt. (Lbs) 170 166 -  BMI 24.39 23.82 -      Physical Exam  Constitutional: He is oriented to person, place, and time.  Well developed, well nourished, NAD, polite  HENT:  Head: Normocephalic and atraumatic.  Eyes: No scleral icterus.  Cardiovascular: Normal rate, regular rhythm and normal heart sounds.   Pulmonary/Chest: Effort normal and breath sounds normal. No respiratory distress. He has no wheezes. He has no rales.  Abdominal: Soft. Bowel sounds are normal. There is no tenderness.  Musculoskeletal: He exhibits no edema.  Neurological: He is alert and oriented to person, place, and time.  Skin: Skin is warm and dry. No rash noted. No erythema. No pallor.  Psychiatric: He has a normal mood and affect. His behavior is normal. Thought content normal.  Vitals reviewed.    Assessment & Plan:    1. Severe asthma, unspecified whether complicated, unspecified whether persistent - Refill albuterol (PROVENTIL HFA;VENTOLIN HFA) 108 (  90 Base) MCG/ACT inhaler; Inhale 2 puffs into the lungs every 4 (four) hours as needed for wheezing or shortness of breath.  Dispense: 1 Inhaler; Refill: 11 - Refill montelukast (SINGULAIR) 10 MG tablet; Take 1 tablet (10 mg total) by mouth at bedtime.  Dispense: 30 tablet; Refill: 11 - Refill ipratropium-albuterol (DUONEB) 0.5-2.5 (3) MG/3ML SOLN; Take 3 mLs by nebulization every 4 (four) hours as needed.  Dispense: 360  mL; Refill: 3  * We have called [facility] pharmacy and verified patient can pick up samples of albuterol inhalers.    Meds ordered this encounter  Medications  . albuterol (PROVENTIL HFA;VENTOLIN HFA) 108 (90 Base) MCG/ACT inhaler    Sig: Inhale 2 puffs into the lungs every 4 (four) hours as needed for wheezing or shortness of breath.    Dispense:  1 Inhaler    Refill:  11    Order Specific Question:   Supervising Provider    Answer:   Tresa Garter [id number]  . montelukast (SINGULAIR) 10 MG tablet    Sig: Take 1 tablet (10 mg total) by mouth at bedtime.    Dispense:  30 tablet    Refill:  11    Order Specific Question:   Supervising Provider    Answer:   Tresa Garter [id number]  . ipratropium-albuterol (DUONEB) 0.5-2.5 (3) MG/3ML SOLN    Sig: Take 3 mLs by nebulization every 4 (four) hours as needed.    Dispense:  360 mL    Refill:  3    Order Specific Question:   Supervising Provider    Answer:   Tresa Garter [[id number]]    Follow-up: Return in about 6 months (around 03/24/2017) for Full physical.   Clent Demark PA

## 2016-10-20 MED FILL — MONTELUKAST SOD 10 MG TAB: 10 | 30 days supply | Qty: 30 | Fill #0

## 2016-10-20 MED FILL — VENTOLIN HFA 90 MCG INHALER: 108 (90 BAS | 16 days supply | Qty: 18 | Fill #0

## 2016-10-20 MED FILL — OMEPRAZOLE DR 40 MG CAPSULE: 40 | 30 days supply | Qty: 30 | Fill #1

## 2016-11-04 ENCOUNTER — Ambulatory Visit: Payer: Medicare Other | Admitting: Emergency Medicine

## 2016-11-28 MED FILL — ?MONTELUKAST SOD 10MG TAB: 10 | 30 days supply | Qty: 30 | Fill #1

## 2016-12-01 MED FILL — !VENTOLIN HFA INHALER: 108 (90 BAS | 16 days supply | Qty: 18 | Fill #1

## 2016-12-12 ENCOUNTER — Other Ambulatory Visit: Payer: Self-pay | Admitting: Emergency Medicine

## 2016-12-12 DIAGNOSIS — R0602 Shortness of breath: Secondary | ICD-10-CM

## 2016-12-15 ENCOUNTER — Ambulatory Visit: Payer: Medicare Other | Admitting: Emergency Medicine

## 2016-12-31 MED FILL — !VENTOLIN HFA INHALER: 108 (90 BAS | 16 days supply | Qty: 18 | Fill #2

## 2017-01-14 MED FILL — IPRAT-ALBUT 0.5-3(2.5) MG/3: 0.5-2.5 (3) | 20 days supply | Qty: 360 | Fill #0

## 2017-01-17 ENCOUNTER — Encounter (HOSPITAL_COMMUNITY): Payer: Self-pay | Admitting: Emergency Medicine

## 2017-01-17 ENCOUNTER — Emergency Department (HOSPITAL_COMMUNITY)
Admission: EM | Admit: 2017-01-17 | Discharge: 2017-01-17 | Disposition: A | Discharge status: Home / Self Care | Payer: Medicare Other | Attending: Emergency Medicine | Admitting: Emergency Medicine

## 2017-01-17 ENCOUNTER — Emergency Department (HOSPITAL_COMMUNITY): Payer: Medicare Other

## 2017-01-17 DIAGNOSIS — R069 Unspecified abnormalities of breathing: Secondary | ICD-10-CM | POA: Diagnosis not present

## 2017-01-17 DIAGNOSIS — Z79899 Other long term (current) drug therapy: Secondary | ICD-10-CM | POA: Insufficient documentation

## 2017-01-17 DIAGNOSIS — R5383 Other fatigue: Secondary | ICD-10-CM | POA: Diagnosis not present

## 2017-01-17 DIAGNOSIS — R0602 Shortness of breath: Secondary | ICD-10-CM | POA: Diagnosis not present

## 2017-01-17 DIAGNOSIS — Z87891 Personal history of nicotine dependence: Secondary | ICD-10-CM | POA: Diagnosis not present

## 2017-01-17 DIAGNOSIS — J45901 Unspecified asthma with (acute) exacerbation: Secondary | ICD-10-CM

## 2017-01-17 DIAGNOSIS — R079 Chest pain, unspecified: Secondary | ICD-10-CM | POA: Diagnosis not present

## 2017-01-17 LAB — BASIC METABOLIC PANEL
ANION GAP: 9 (ref 5–15)
BUN: 15 mg/dL (ref 6–20)
CO2: 25 mmol/L (ref 22–32)
Calcium: 9.1 mg/dL (ref 8.9–10.3)
Chloride: 104 mmol/L (ref 101–111)
Creatinine, Ser: 1.25 mg/dL — ABNORMAL HIGH (ref 0.61–1.24)
GFR calc non Af Amer: >60 mL/min (ref >60)
Glucose, Bld: 114 mg/dL — ABNORMAL HIGH (ref 65–99)
Potassium: 3.6 mmol/L (ref 3.5–5.1)
SODIUM: 138 mmol/L (ref 135–145)

## 2017-01-17 LAB — I-STAT TROPONIN, ED
TROPONIN I, POC: 0 ng/mL (ref 0.00–0.08)
Troponin i, poc: 0 ng/mL (ref 0.00–0.08)

## 2017-01-17 LAB — CBC
HCT: 41.3 % (ref 39.0–52.0)
HEMOGLOBIN: 12.7 g/dL — AB (ref 13.0–17.0)
MCH: 25.9 pg — AB (ref 26.0–34.0)
MCHC: 30.8 g/dL (ref 30.0–36.0)
MCV: 84.3 fL (ref 78.0–100.0)
PLATELETS: 146 10*3/uL — AB (ref 150–400)
RBC: 4.9 MIL/uL (ref 4.22–5.81)
RDW: 13.9 % (ref 11.5–15.5)
WBC: 6.9 10*3/uL (ref 4.0–10.5)

## 2017-01-17 IMAGING — DX DG CHEST 2V
2 series · 2 of 2 positions shown · non-contrast
Comparison: Radiograph [DATE]

CLINICAL DATA: Chest pain.  Shortness of breath.

EXAM:
CHEST  2 VIEW

[chest pa]
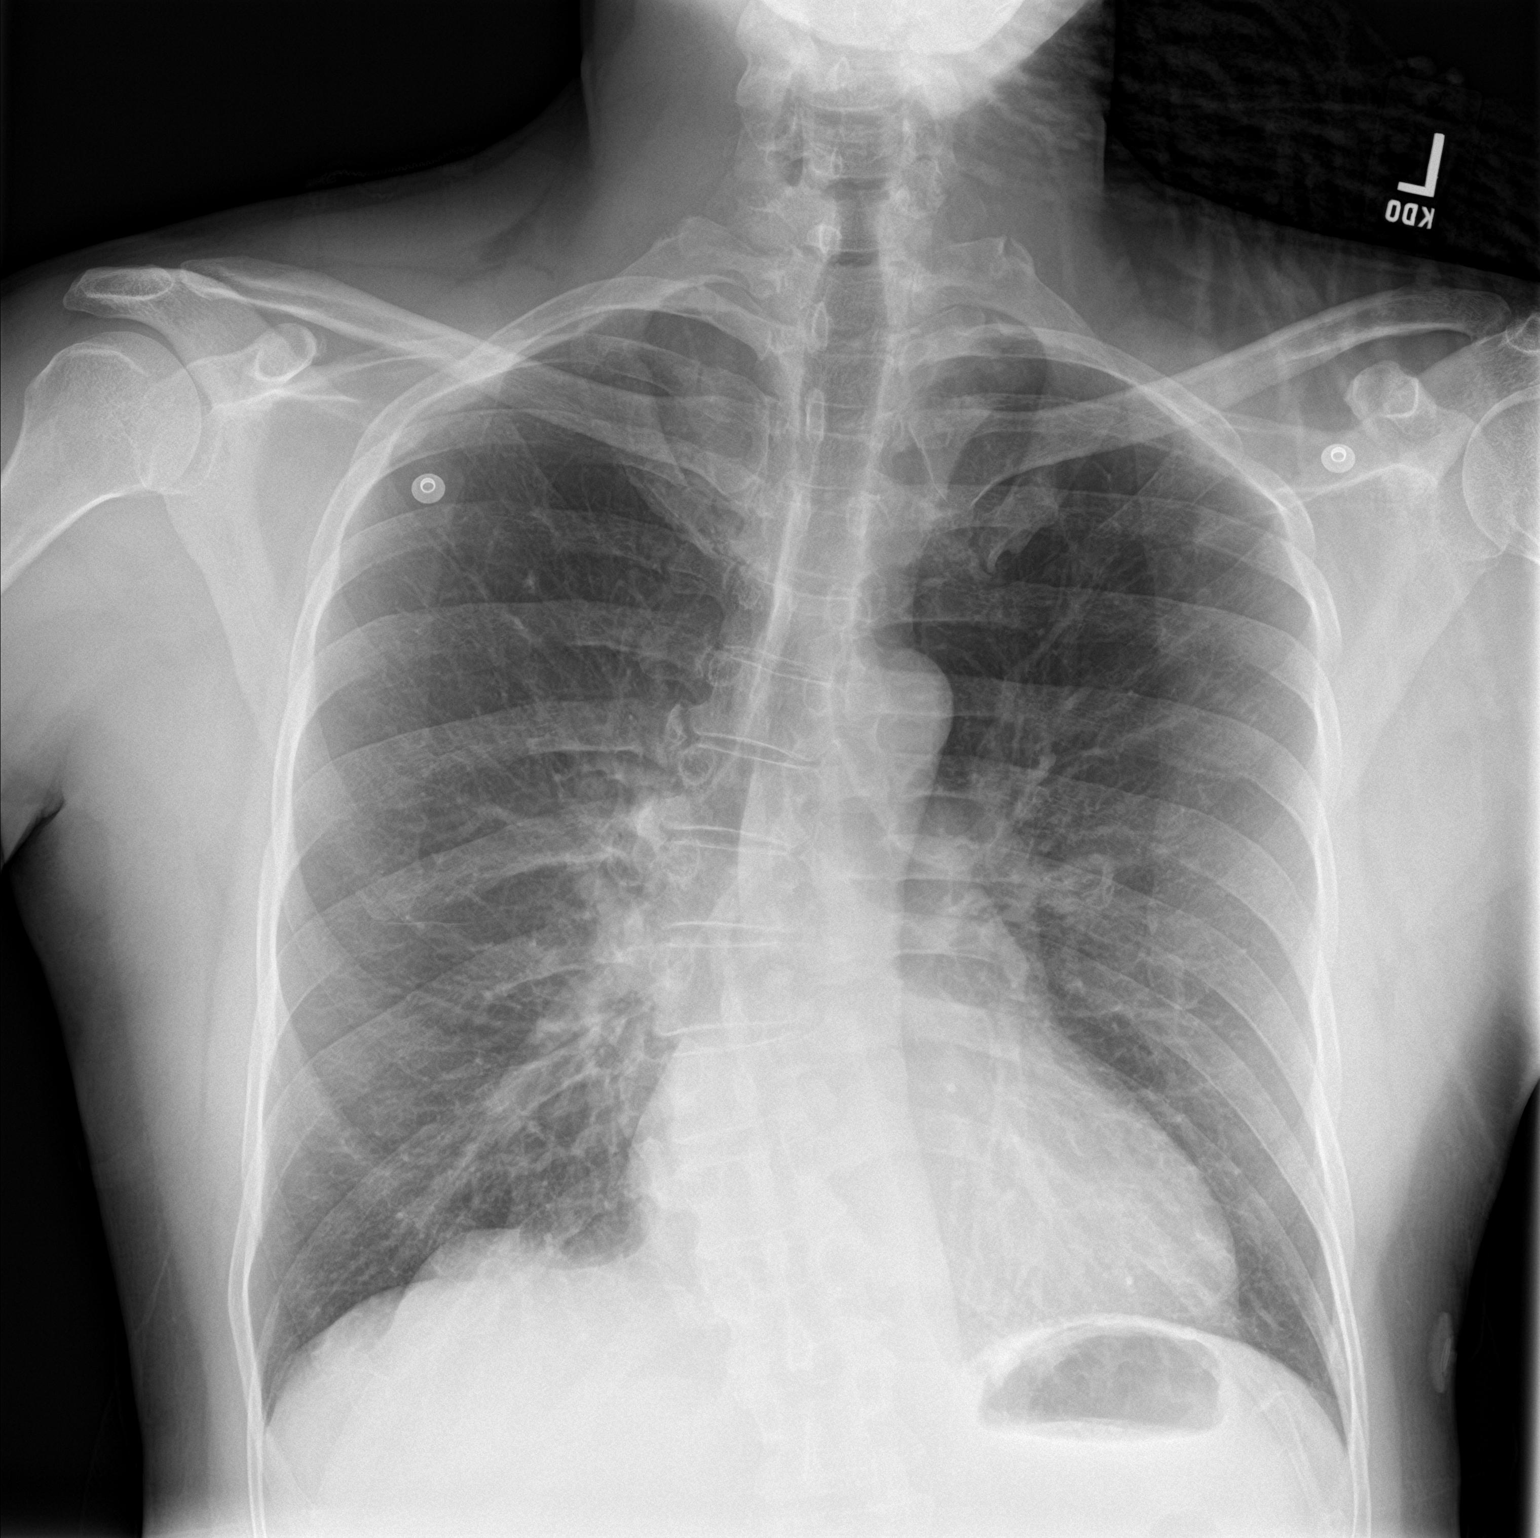

[chest lat]
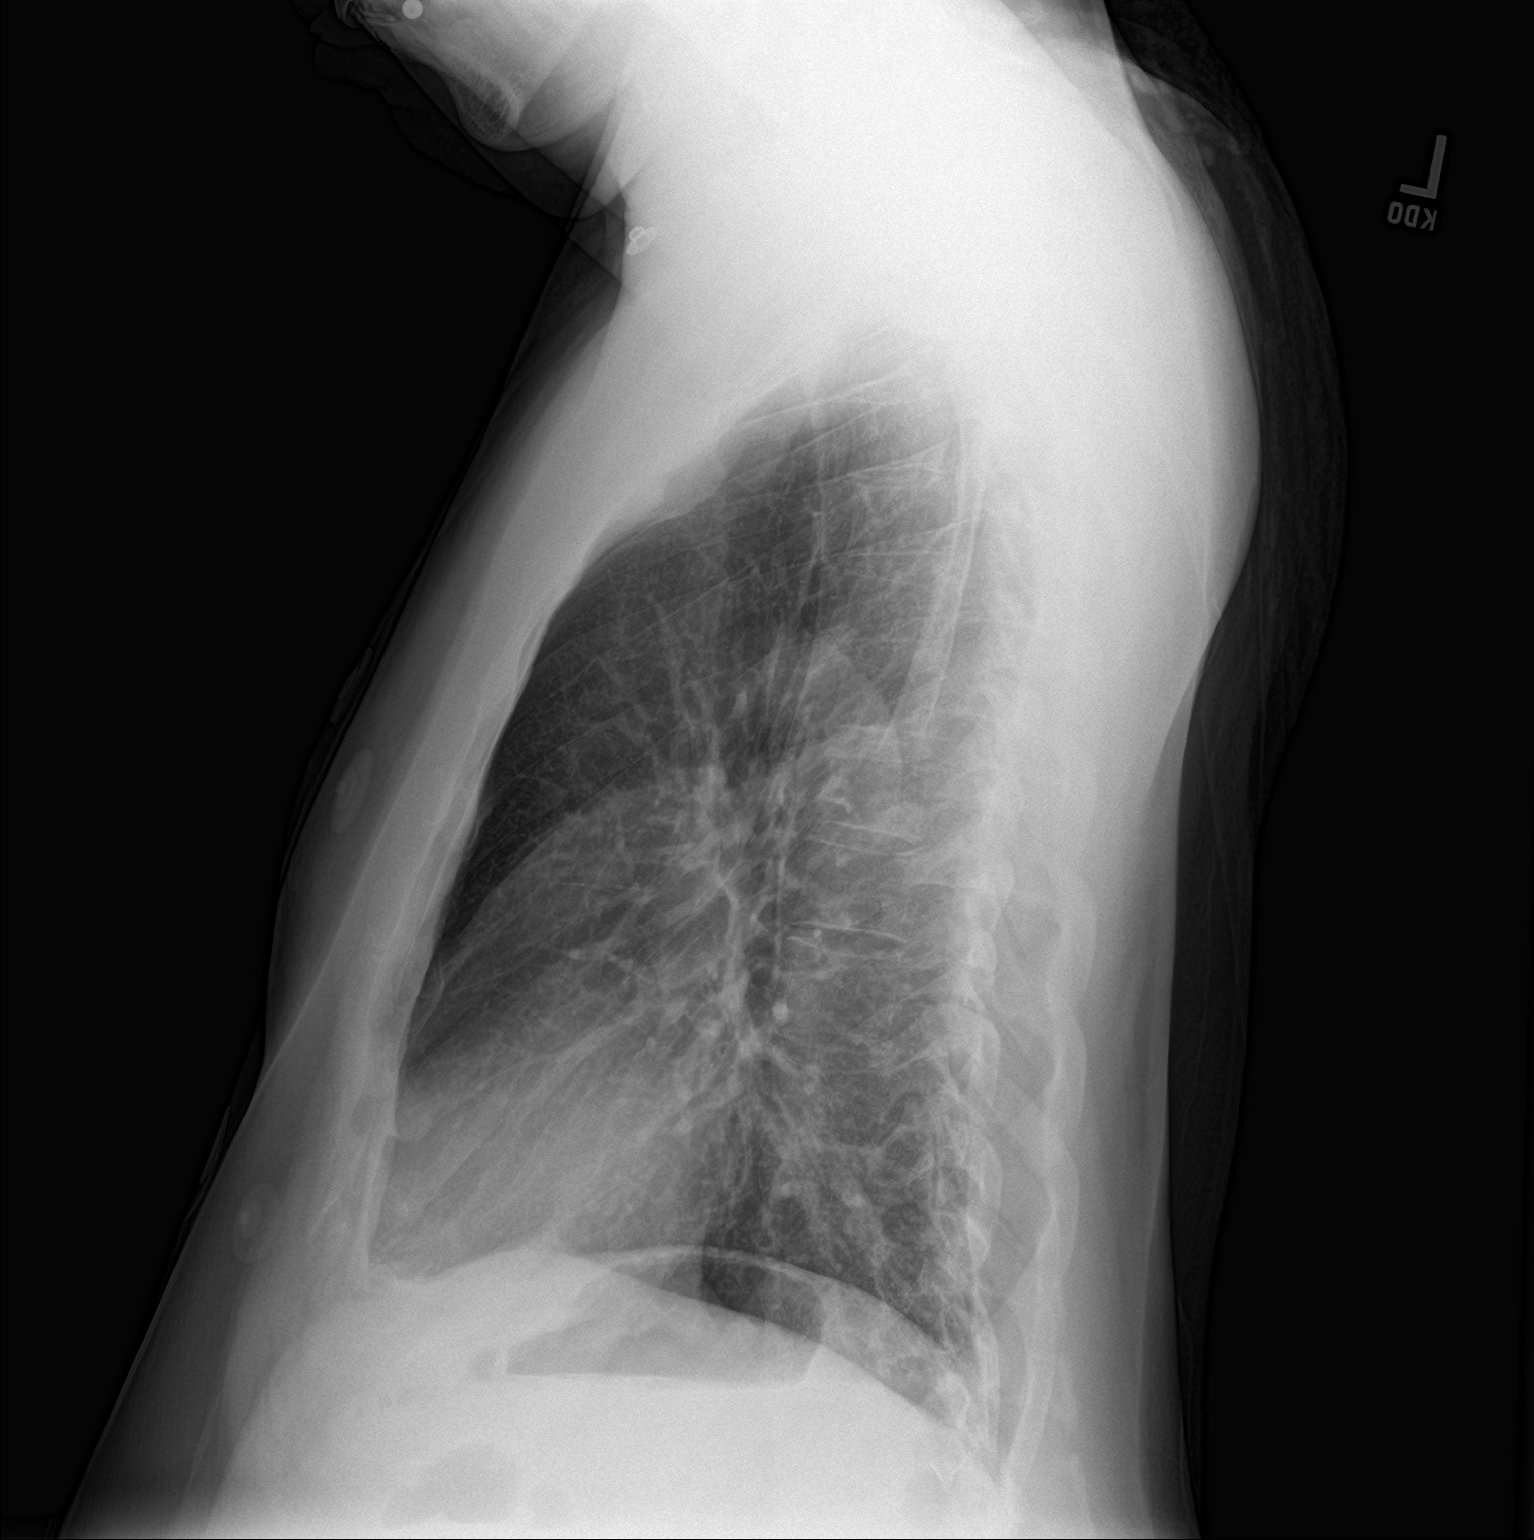

[2 of 2 positions shown; findings below may reference images not displayed]

FINDINGS: The cardiomediastinal contours are normal. Mild central bronchial
thickening. Pulmonary vasculature is normal. No consolidation,
pleural effusion, or pneumothorax. No acute osseous abnormalities
are seen. Scoliotic curvature of spine.
IMPRESSION: Mild central bronchial thickening can be seen with bronchitis or
asthma.

## 2017-01-17 MED ORDER — PREDNISONE 20 MG PO TABS: ORAL_TABLET | ORAL | 0 refills | Status: DC

## 2017-01-17 MED ORDER — ALBUTEROL SULFATE HFA 108 (90 BASE) MCG/ACT IN AERS
2.0000 | INHALATION_SPRAY | RESPIRATORY_TRACT | Status: DC | PRN
  Filled 2017-01-17: qty 6.7

## 2017-01-17 NOTE — ED Provider Notes (Signed)
[facility] EMERGENCY DEPARTMENT Provider Note   CSN: [id number] Arrival date & time: 01/17/17  0516     History   Chief Complaint Chief Complaint  Patient presents with  . Shortness of Breath    HPI Alexander Reeves is a 52 y.o. male.  Patient is a 52 year old male with a history of asthma, GERD, and deafness who presents with wheezing and shortness of breath.  He states he started feeling short of breath about 330 this morning.  He had wheezing and dry cough which is consistent with his prior asthma exacerbations.  His cough is nonproductive.  He does have some chest discomfort in the center of his chest but he describes it as feeling like his prior reflux.  He states he ate some cheese which she thinks caused the reflux.  He was given duo nebs and Solu-Medrol by EMS is feeling pretty much back to baseline at this point.  He denies any fevers.  No leg pain or swelling.  History was obtained through the video interpreter line.      Past Medical History:  Diagnosis Date  . Arthritis   . Asthma   . Deaf   . GERD (gastroesophageal reflux disease)   . Thyroid disease     Patient Active Problem List   Diagnosis Date Noted  . Asthma exacerbation 09/01/2016  . Chronic asthma, severe persistent, with acute exacerbation 08/10/2016  . Severe asthma 07/04/2016    Past Surgical History:  Procedure Laterality Date  . NO PAST SURGERIES         Home Medications    Prior to Admission medications   Medication Sig Start Date End Date Taking? Authorizing Provider  albuterol (PROVENTIL HFA;VENTOLIN HFA) 108 (90 Base) MCG/ACT inhaler Inhale 2 puffs into the lungs every 4 (four) hours as needed for wheezing or shortness of breath. 09/24/16   Clent Demark, PA-C  ipratropium-albuterol (DUONEB) 0.5-2.5 (3) MG/3ML SOLN Take 3 mLs by nebulization every 4 (four) hours as needed. 09/24/16   Clent Demark, PA-C  loratadine (CLARITIN) 10 MG tablet Take 1 tablet (10 mg  total) by mouth daily. 09/05/16   Erick Colace, NP  mometasone-formoterol (DULERA) 100-5 MCG/ACT AERO Inhale 2 puffs into the lungs 2 (two) times daily. 09/23/16   Parrett, Fonnie Mu, NP  montelukast (SINGULAIR) 10 MG tablet Take 1 tablet (10 mg total) by mouth at bedtime. 09/24/16   Clent Demark, PA-C  omeprazole (PRILOSEC) 40 MG capsule Take 1 capsule (40 mg total) by mouth daily. 08/13/16   Colbert Ewing, MD  predniSONE (DELTASONE) 20 MG tablet 2 tabs po daily x 4 days 01/17/17   Malvin Johns, MD    Family History Family History  Problem Relation Age of Onset  . Heart disease Father     Social History Social History   Tobacco Use  . Smoking status: Former Smoker    Types: Cigarettes    Last attempt to quit: 06/24/2016    Years since quitting: 0.5  . Smokeless tobacco: Never Used  Substance Use Topics  . Alcohol use: Yes    Comment: 2 weeks ago-vodka  . Drug use: Yes    Types: Marijuana    Comment: 3 days ago     Allergies   Cheese; Eggs or egg-derived products; and Penicillins   Review of Systems Review of Systems  Constitutional: Positive for fatigue. Negative for chills, diaphoresis and fever.  HENT: Negative for congestion, rhinorrhea and sneezing.   Eyes: Negative.  Respiratory: Positive for cough, shortness of breath and wheezing. Negative for chest tightness.   Cardiovascular: Positive for chest pain. Negative for leg swelling.  Gastrointestinal: Negative for abdominal pain, blood in stool, diarrhea, nausea and vomiting.  Genitourinary: Negative for difficulty urinating, flank pain, frequency and hematuria.  Musculoskeletal: Negative for arthralgias and back pain.  Skin: Negative for rash.  Neurological: Negative for dizziness, speech difficulty, weakness, numbness and headaches.     Physical Exam Updated Vital Signs BP 139/88 (BP Location: Right Arm)   Pulse 91   Temp 98.4 F (36.9 C) (Oral)   Resp (!) 22   Ht 5\' 10"  (1.778 m)   Wt 77.1 kg (170  lb)   SpO2 100%   BMI 24.39 kg/m   Physical Exam  Constitutional: He is oriented to person, place, and time. He appears well-developed and well-nourished.  HENT:  Head: Normocephalic and atraumatic.  Eyes: Pupils are equal, round, and reactive to light.  Neck: Normal range of motion. Neck supple.  Cardiovascular: Normal rate, regular rhythm and normal heart sounds.  Pulmonary/Chest: Effort normal. No accessory muscle usage. No respiratory distress. He has wheezes. He has no rales. He exhibits no tenderness.  Few scattered expiratory wheezing.  Patient is talking in full sentences with no accessory muscle use  Abdominal: Soft. Bowel sounds are normal. There is no tenderness. There is no rebound and no guarding.  Musculoskeletal: Normal range of motion. He exhibits no edema.       Right lower leg: He exhibits no edema.  No edema or calf tenderness  Lymphadenopathy:    He has no cervical adenopathy.  Neurological: He is alert and oriented to person, place, and time.  Skin: Skin is warm and dry. No rash noted.  Psychiatric: He has a normal mood and affect.     ED Treatments / Results  Labs (all labs ordered are listed, but only abnormal results are displayed) Labs Reviewed  BASIC METABOLIC PANEL - Abnormal; Notable for the following components:      Result Value   Glucose, Bld 114 (*)    Creatinine, Ser 1.25 (*)    All other components within normal limits  CBC - Abnormal; Notable for the following components:   Hemoglobin 12.7 (*)    MCH 25.9 (*)    Platelets 146 (*)    All other components within normal limits  I-STAT TROPONIN, ED  I-STAT TROPONIN, ED    EKG  EKG Interpretation  Date/Time:  Saturday January 17 2017 05:22:59 EST Ventricular Rate:  106 PR Interval:  182 QRS Duration: 86 QT Interval:  360 QTC Calculation: 478 R Axis:   83 Text Interpretation:  Sinus tachycardia Otherwise normal ECG since last tracing no significant change Confirmed by Malvin Johns  [phone]) on 01/17/2017 7:52:34 AM       Radiology Dg Chest 2 View  Result Date: 01/17/2017 CLINICAL DATA:  Chest pain.  Shortness of breath. EXAM: CHEST  2 VIEW COMPARISON:  Radiograph 09/01/2016 FINDINGS: The cardiomediastinal contours are normal. Mild central bronchial thickening. Pulmonary vasculature is normal. No consolidation, pleural effusion, or pneumothorax. No acute osseous abnormalities are seen. Scoliotic curvature of spine. IMPRESSION: Mild central bronchial thickening can be seen with bronchitis or asthma. Electronically Signed   By: Jeb Levering M.D.   On: 01/17/2017 05:51    Procedures Procedures (including critical care time)  Medications Ordered in ED Medications  albuterol (PROVENTIL HFA;VENTOLIN HFA) 108 (90 Base) MCG/ACT inhaler 2 puff (not administered)  Initial Impression / Assessment and Plan / ED Course  I have reviewed the triage vital signs and the nursing notes.  Pertinent labs & imaging results that were available during my care of the patient were reviewed by me and considered in my medical decision making (see chart for details).     Patient is a 52 year old male who presents with an asthma exacerbation.  He had some mild associated chest pain which he felt was consistent with his prior reflux.  He has no ischemic changes on EKG.  Troponin x2 is negative.  He does not have any pleuritic pain or other symptoms that would be more concerning for pulmonary embolus.  His heart rate has normalized.  He has no hypoxia.  He is feeling much better after treatment in the ED.  His lungs have very scarce expiratory wheezing and no accessory muscle use.  No tachypnea on my exam.  He states he is feeling much better and is ready to go home.  He was discharged home in good condition.  Return precautions were given.  He was discharged with a prednisone 4-day supply.  He has his nebulizer medications at home to use.  He is also dispensed an albuterol inhaler.  He was  encouraged to make a follow-up appointment with his primary care physician.  Final Clinical Impressions(s) / ED Diagnoses   Final diagnoses:  Moderate asthma with exacerbation, unspecified whether persistent    ED Discharge Orders        Ordered    predniSONE (DELTASONE) 20 MG tablet     01/17/17 0940       Malvin Johns, MD 01/17/17 [phone]

## 2017-01-17 NOTE — ED Triage Notes (Signed)
Interpreter has been called for pt

## 2017-01-17 NOTE — ED Triage Notes (Signed)
BIB EMS from home, pt reports SOB X few hrs. Wheezing noted. Pt was given duoneb and solu-medrol en route. VSS.  Pt is deaf and mute.

## 2017-01-17 NOTE — ED Notes (Signed)
Went to draw pt troponin, pt not in bed. Jacket at bedside.

## 2017-01-17 NOTE — ED Notes (Signed)
Called sign language, working on finding interpretor to send.

## 2017-04-16 MED FILL — ALBUTEROL SULFATE HFA 108 (: 108 (90 BAS | 16 days supply | Qty: 18 | Fill #3

## 2017-05-06 ENCOUNTER — Other Ambulatory Visit: Payer: Self-pay

## 2017-05-06 ENCOUNTER — Encounter (INDEPENDENT_AMBULATORY_CARE_PROVIDER_SITE_OTHER): Payer: Self-pay | Admitting: Physician Assistant

## 2017-05-06 ENCOUNTER — Ambulatory Visit (INDEPENDENT_AMBULATORY_CARE_PROVIDER_SITE_OTHER): Payer: Medicare Other | Admitting: Physician Assistant

## 2017-05-06 VITALS — BP 114/80 | HR 88 | Temp 97.1°F | Ht 70.5 in | Wt 175.2 lb

## 2017-05-06 DIAGNOSIS — J4540 Moderate persistent asthma, uncomplicated: Secondary | ICD-10-CM

## 2017-05-06 MED ORDER — LORATADINE 10 MG PO TABS: 10.0000 mg | ORAL_TABLET | Freq: Every day | ORAL | 11 refills | Status: DC

## 2017-05-06 MED ORDER — MOMETASONE FURO-FORMOTEROL FUM 100-5 MCG/ACT IN AERO: 2.0000 | INHALATION_SPRAY | Freq: Two times a day (BID) | RESPIRATORY_TRACT | 11 refills | Status: DC

## 2017-05-06 MED ORDER — IPRATROPIUM-ALBUTEROL 0.5-2.5 (3) MG/3ML IN SOLN: 3.0000 mL | RESPIRATORY_TRACT | 3 refills | Status: DC | PRN

## 2017-05-06 MED ORDER — MONTELUKAST SODIUM 10 MG PO TABS: 10.0000 mg | ORAL_TABLET | Freq: Every day | ORAL | 11 refills | Status: DC

## 2017-05-06 MED ORDER — FLUTICASONE FUROATE-VILANTEROL 200-25 MCG/INH IN AEPB: 1.0000 | INHALATION_SPRAY | Freq: Every day | RESPIRATORY_TRACT | 11 refills | Status: DC

## 2017-05-06 MED ORDER — ALBUTEROL SULFATE HFA 108 (90 BASE) MCG/ACT IN AERS: 2.0000 | INHALATION_SPRAY | RESPIRATORY_TRACT | 11 refills | Status: DC | PRN

## 2017-05-06 MED FILL — MONTELUKAST SOD 10 MG TAB: 10 | 30 days supply | Qty: 30 | Fill #0

## 2017-05-06 MED FILL — BREO ELLIPTA 200-25 MCG INH: 200-25 | 30 days supply | Qty: 60 | Fill #0

## 2017-05-06 MED FILL — VENTOLIN HFA 90 MCG INHALER: 108 (90 BAS | 16 days supply | Qty: 18 | Fill #0

## 2017-05-06 NOTE — Progress Notes (Signed)
Subjective:  Patient ID: Alexander Reeves, male    DOB: 1965-09-11  Age: 52 y.o. MRN: [id number]  CC: asthma and medication refill  HPI Alexander Reeves is a 52 y.o. male with a medical history of asthma, GERD, thyroid disease, and deafness presents for refill of his asthma medications. He is currently well with no exacerbations. He is planning to go on vacation and wants to make sure he has enough medications. Does not endorse any other symptoms or complaints.      Outpatient Medications Prior to Visit  Medication Sig Dispense Refill  . albuterol (PROVENTIL HFA;VENTOLIN HFA) 108 (90 Base) MCG/ACT inhaler Inhale 2 puffs into the lungs every 4 (four) hours as needed for wheezing or shortness of breath. 1 Inhaler 11  . ipratropium-albuterol (DUONEB) 0.5-2.5 (3) MG/3ML SOLN Take 3 mLs by nebulization every 4 (four) hours as needed. 360 mL 3  . loratadine (CLARITIN) 10 MG tablet Take 1 tablet (10 mg total) by mouth daily.    . mometasone-formoterol (DULERA) 100-5 MCG/ACT AERO Inhale 2 puffs into the lungs 2 (two) times daily. 4 Inhaler 0  . montelukast (SINGULAIR) 10 MG tablet Take 1 tablet (10 mg total) by mouth at bedtime. 30 tablet 11  . omeprazole (PRILOSEC) 40 MG capsule Take 1 capsule (40 mg total) by mouth daily. 30 capsule 3  . predniSONE (DELTASONE) 20 MG tablet 2 tabs po daily x 4 days 8 tablet 0   No facility-administered medications prior to visit.      ROS Review of Systems  Constitutional: Negative for chills, fever and malaise/fatigue.  Eyes: Negative for blurred vision.  Respiratory: Negative for shortness of breath.   Cardiovascular: Negative for chest pain and palpitations.  Gastrointestinal: Negative for abdominal pain and nausea.  Genitourinary: Negative for dysuria and hematuria.  Musculoskeletal: Negative for joint pain and myalgias.  Skin: Negative for rash.  Neurological: Negative for tingling and headaches.  Psychiatric/Behavioral: Negative for depression. The  patient is not nervous/anxious.     Objective:  BP 114/80 (BP Location: Right Arm, Patient Position: Sitting, Cuff Size: Normal)   Pulse 88   Temp (!) 97.1 F (36.2 C) (Oral)   Ht 5' 10.5" (1.791 m)   Wt 175 lb 3.2 oz (79.5 kg)   SpO2 93%   BMI 24.78 kg/m   BP/Weight 05/06/2017 01/17/2017 05/01/3788  Systolic BP 240 973 532  Diastolic BP 80 94 81  Wt. (Lbs) 175.2 170 170  BMI 24.78 24.39 24.39      Physical Exam  Constitutional: He is oriented to person, place, and time.  Well developed, well nourished, NAD, polite  HENT:  Head: Normocephalic and atraumatic.  Eyes: No scleral icterus.  Neck: Normal range of motion. Neck supple. No thyromegaly present.  Cardiovascular: Normal rate, regular rhythm and normal heart sounds.  Pulmonary/Chest: Effort normal and breath sounds normal.  Musculoskeletal: He exhibits no edema.  Neurological: He is alert and oriented to person, place, and time.  Skin: Skin is warm and dry. No rash noted. No erythema. No pallor.  Psychiatric: He has a normal mood and affect. His behavior is normal. Thought content normal.  Vitals reviewed.    Assessment & Plan:     1. Moderate persistent asthma without complication - montelukast (SINGULAIR) 10 MG tablet; Take 1 tablet (10 mg total) by mouth at bedtime.  Dispense: 30 tablet; Refill: 11 - loratadine (CLARITIN) 10 MG tablet; Take 1 tablet (10 mg total) by mouth daily.  Dispense: 30 tablet; Refill: 11 -  ipratropium-albuterol (DUONEB) 0.5-2.5 (3) MG/3ML SOLN; Take 3 mLs by nebulization every 4 (four) hours as needed.  Dispense: 360 mL; Refill: 3 - albuterol (PROVENTIL HFA;VENTOLIN HFA) 108 (90 Base) MCG/ACT inhaler; Inhale 2 puffs into the lungs every 4 (four) hours as needed for wheezing or shortness of breath.  Dispense: 1 Inhaler; Refill: 11 - mometasone-formoterol (DULERA) 100-5 MCG/ACT AERO; Inhale 2 puffs into the lungs 2 (two) times daily.  Dispense: 1 Inhaler; Refill: 11 - I have asked patient to  call [facility] to have the PFT his pulmonologist was recommending. Pt said he will call and have PFT done.     Meds ordered this encounter  Medications  . montelukast (SINGULAIR) 10 MG tablet    Sig: Take 1 tablet (10 mg total) by mouth at bedtime.    Dispense:  30 tablet    Refill:  11    Order Specific Question:   Supervising Provider    Answer:   Tresa Garter [id number]  . loratadine (CLARITIN) 10 MG tablet    Sig: Take 1 tablet (10 mg total) by mouth daily.    Dispense:  30 tablet    Refill:  11    Order Specific Question:   Supervising Provider    Answer:   Tresa Garter [id number]  . ipratropium-albuterol (DUONEB) 0.5-2.5 (3) MG/3ML SOLN    Sig: Take 3 mLs by nebulization every 4 (four) hours as needed.    Dispense:  360 mL    Refill:  3    Order Specific Question:   Supervising Provider    Answer:   Tresa Garter [id number]  . albuterol (PROVENTIL HFA;VENTOLIN HFA) 108 (90 Base) MCG/ACT inhaler    Sig: Inhale 2 puffs into the lungs every 4 (four) hours as needed for wheezing or shortness of breath.    Dispense:  1 Inhaler    Refill:  11    Order Specific Question:   Supervising Provider    Answer:   Tresa Garter [id number]  . mometasone-formoterol (DULERA) 100-5 MCG/ACT AERO    Sig: Inhale 2 puffs into the lungs 2 (two) times daily.    Dispense:  1 Inhaler    Refill:  11    Order Specific Question:   Supervising Provider    Answer:   Tresa Garter [[id number]]    Order Specific Question:   Lot Number?    Answer:   [id number], [id number]    Order Specific Question:   Expiration Date?    Answer:   01/15/2017    Comments:   12-12-16    Order Specific Question:   Manufacturer?    Answer:   [employer] [[id number]]    Order Specific Question:   Quantity    Answer:   4    Comments:   samples    Follow-up: 4 months for annual physical  Clent Demark PA

## 2017-05-06 NOTE — Patient Instructions (Addendum)
Continue on Dulera 2 puffs Twice daily  , rinse after use. This is your controller inhaler .  Use Ventolin 2 puffs every 4hr as needed for wheezing , this is your rescue /emergency inhaler  Please contact office for sooner follow up if symptoms do not improve or worsen or seek emergency care   Asthma, Adult Asthma is a condition of the lungs in which the airways tighten and narrow. Asthma can make it hard to breathe. Asthma cannot be cured, but medicine and lifestyle changes can help control it. Asthma may be started (triggered) by:  Animal skin flakes (dander).  Dust.  Cockroaches.  Pollen.  Mold.  Smoke.  Cleaning products.  Hair sprays or aerosol sprays.  Paint fumes or strong smells.  Cold air, weather changes, and winds.  Crying or laughing hard.  Stress.  Certain medicines or drugs.  Foods, such as dried fruit, potato chips, and sparkling grape juice.  Infections or conditions (colds, flu).  Exercise.  Certain medical conditions or diseases.  Exercise or tiring activities.  Follow these instructions at home:  Take medicine as told by your doctor.  Use a peak flow meter as told by your doctor. A peak flow meter is a tool that measures how well the lungs are working.  Record and keep track of the peak flow meter's readings.  Understand and use the asthma action plan. An asthma action plan is a written plan for taking care of your asthma and treating your attacks.  To help prevent asthma attacks: ? Do not smoke. Stay away from secondhand smoke. ? Change your heating and air conditioning filter often. ? Limit your use of fireplaces and wood stoves. ? Get rid of pests (such as roaches and mice) and their droppings. ? Throw away plants if you see mold on them. ? Clean your floors. Dust regularly. Use cleaning products that do not smell. ? Have someone vacuum when you are not home. Use a vacuum cleaner with a HEPA filter if possible. ? Replace carpet with  wood, tile, or vinyl flooring. Carpet can trap animal skin flakes and dust. ? Use allergy-proof pillows, mattress covers, and box spring covers. ? Wash bed sheets and blankets every week in hot water and dry them in a dryer. ? Use blankets that are made of polyester or cotton. ? Clean bathrooms and kitchens with bleach. If possible, have someone repaint the walls in these rooms with mold-resistant paint. Keep out of the rooms that are being cleaned and painted. ? Wash hands often. Contact a doctor if:  You have make a whistling sound when breaking (wheeze), have shortness of breath, or have a cough even if taking medicine to prevent attacks.  The colored mucus you cough up (sputum) is thicker than usual.  The colored mucus you cough up changes from clear or white to yellow, green, gray, or bloody.  You have problems from the medicine you are taking such as: ? A rash. ? Itching. ? Swelling. ? Trouble breathing.  You need reliever medicines more than 2-3 times a week.  Your peak flow measurement is still at 50-79% of your personal best after following the action plan for 1 hour.  You have a fever. Get help right away if:  You seem to be worse and are not responding to medicine during an asthma attack.  You are short of breath even at rest.  You get short of breath when doing very little activity.  You have trouble eating, drinking, or  talking.  You have chest pain.  You have a fast heartbeat.  Your lips or fingernails start to turn blue.  You are light-headed, dizzy, or faint.  Your peak flow is less than 50% of your personal best. This information is not intended to replace advice given to you by your health care provider. Make sure you discuss any questions you have with your health care provider. Document Released: 06/18/2007 Document Revised: 06/07/2015 Document Reviewed: 07/29/2012 Elsevier Interactive Patient Education  2017 [name].

## 2017-07-09 MED FILL — BREO ELLIPTA 200-25 MCG INH: 200-25 | 30 days supply | Qty: 60 | Fill #1

## 2017-08-31 ENCOUNTER — Ambulatory Visit (INDEPENDENT_AMBULATORY_CARE_PROVIDER_SITE_OTHER): Payer: Medicare Other | Admitting: Physician Assistant

## 2017-08-31 ENCOUNTER — Encounter (INDEPENDENT_AMBULATORY_CARE_PROVIDER_SITE_OTHER): Payer: Self-pay | Admitting: Physician Assistant

## 2017-08-31 VITALS — BP 118/75 | HR 79 | Temp 97.4°F | Ht 70.5 in | Wt 177.2 lb

## 2017-08-31 DIAGNOSIS — Z760 Encounter for issue of repeat prescription: Secondary | ICD-10-CM

## 2017-08-31 DIAGNOSIS — N50819 Testicular pain, unspecified: Secondary | ICD-10-CM | POA: Diagnosis not present

## 2017-08-31 DIAGNOSIS — N509 Disorder of male genital organs, unspecified: Secondary | ICD-10-CM | POA: Diagnosis not present

## 2017-08-31 DIAGNOSIS — N5089 Other specified disorders of the male genital organs: Secondary | ICD-10-CM

## 2017-08-31 MED ORDER — MONTELUKAST SODIUM 10 MG PO TABS: 10.0000 mg | ORAL_TABLET | Freq: Every day | ORAL | 11 refills | Status: DC

## 2017-08-31 MED ORDER — LORATADINE 10 MG PO TABS: 10.0000 mg | ORAL_TABLET | Freq: Every day | ORAL | 11 refills | Status: DC

## 2017-08-31 MED ORDER — FLUTICASONE FUROATE-VILANTEROL 200-25 MCG/INH IN AEPB: 1.0000 | INHALATION_SPRAY | Freq: Every day | RESPIRATORY_TRACT | 11 refills | Status: DC

## 2017-08-31 MED ORDER — ALBUTEROL SULFATE HFA 108 (90 BASE) MCG/ACT IN AERS: 2.0000 | INHALATION_SPRAY | RESPIRATORY_TRACT | 11 refills | Status: DC | PRN

## 2017-08-31 MED ORDER — IPRATROPIUM-ALBUTEROL 0.5-2.5 (3) MG/3ML IN SOLN: 3.0000 mL | RESPIRATORY_TRACT | 3 refills | Status: DC | PRN

## 2017-08-31 NOTE — Progress Notes (Signed)
Subjective:  Patient ID: Alexander Reeves, male    DOB: April 06, 1965  Age: 52 y.o. MRN: [id number]  CC: testicle nodule  HPI  Alexander Reeves is a 52 y.o. male with a medical history of asthma, GERD, thyroid disease, and deafness presents with right sided testicular mass and mild pain. Onset approximately two months ago. Mass is only tender to palpation, otherwise there is no pain. There is no sexual dysfunction, penile drainage, testicular swelling, genital lesions, urinary sxs, or constitutional symptoms. Requests a refill of all his medications for asthma.    Outpatient Medications Prior to Visit  Medication Sig Dispense Refill  . albuterol (PROVENTIL HFA;VENTOLIN HFA) 108 (90 Base) MCG/ACT inhaler Inhale 2 puffs into the lungs every 4 (four) hours as needed for wheezing or shortness of breath. 1 Inhaler 11  . fluticasone furoate-vilanterol (BREO ELLIPTA) 200-25 MCG/INH AEPB Inhale 1 puff into the lungs daily. 60 each 11  . ipratropium-albuterol (DUONEB) 0.5-2.5 (3) MG/3ML SOLN Take 3 mLs by nebulization every 4 (four) hours as needed. 360 mL 3  . loratadine (CLARITIN) 10 MG tablet Take 1 tablet (10 mg total) by mouth daily. 30 tablet 11  . montelukast (SINGULAIR) 10 MG tablet Take 1 tablet (10 mg total) by mouth at bedtime. 30 tablet 11   No facility-administered medications prior to visit.      ROS Review of Systems  Constitutional: Negative for chills, fever and malaise/fatigue.  Eyes: Negative for blurred vision.  Respiratory: Negative for shortness of breath.   Cardiovascular: Negative for chest pain and palpitations.  Gastrointestinal: Negative for abdominal pain and nausea.  Genitourinary: Negative for dysuria and hematuria.       Testicular mass  Musculoskeletal: Negative for joint pain and myalgias.  Skin: Negative for rash.  Neurological: Negative for tingling and headaches.  Psychiatric/Behavioral: Negative for depression. The patient is not nervous/anxious.      Objective:  BP 118/75   Pulse 79   Temp (!) 97.4 F (36.3 C) (Oral)   Ht 5' 10.5" (1.791 m)   Wt 177 lb 3.2 oz (80.4 kg)   SpO2 93%   BMI 25.07 kg/m   BP/Weight 08/31/2017 1/85/6314 09/19/261  Systolic BP 785 885 027  Diastolic BP 75 80 94  Wt. (Lbs) 177.2 175.2 170  BMI 25.07 24.78 24.39      Physical Exam  Constitutional: He is oriented to person, place, and time.  Well developed, well nourished, NAD, polite  HENT:  Head: Normocephalic and atraumatic.  Eyes: No scleral icterus.  Neck: Normal range of motion. Neck supple. No thyromegaly present.  Pulmonary/Chest: Effort normal.  Genitourinary:  Genitourinary Comments: Firm, mildly tender, length wise mass running along the spermatic cord to the epididymus of right testicle. No genital lesions, testicular swelling, torsion, or penile discharge.  Musculoskeletal: He exhibits no edema.  Neurological: He is alert and oriented to person, place, and time.  Skin: Skin is warm and dry. No rash noted. No erythema. No pallor.  Psychiatric: He has a normal mood and affect. His behavior is normal. Thought content normal.  Vitals reviewed.    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 Epididymal mass - US SCROTUM W/DOPPLER; Future  3. Epididymal pain - US SCROTUM W/DOPPLER; Future  4. Medication refill - montelukast (SINGULAIR) 10 MG tablet; Take 1 tablet (10 mg total) by mouth at bedtime.  Dispense: 30 tablet; Refill: 11 - loratadine (CLARITIN) 10 MG tablet; Take 1 tablet (10  mg total) by mouth daily.  Dispense: 30 tablet; Refill: 11 - ipratropium-albuterol (DUONEB) 0.5-2.5 (3) MG/3ML SOLN; Take 3 mLs by nebulization every 4 (four) hours as needed.  Dispense: 360 mL; Refill: 3 - fluticasone furoate-vilanterol (BREO ELLIPTA) 200-25 MCG/INH AEPB; Inhale 1 puff into the lungs daily.  Dispense: 60 each; Refill: 11 - albuterol (PROVENTIL HFA;VENTOLIN HFA) 108 (90 Base) MCG/ACT inhaler;  Inhale 2 puffs into the lungs every 4 (four) hours as needed for wheezing or shortness of breath.  Dispense: 1 Inhaler; Refill: 11   Meds ordered this encounter  Medications  . montelukast (SINGULAIR) 10 MG tablet    Sig: Take 1 tablet (10 mg total) by mouth at bedtime.    Dispense:  30 tablet    Refill:  11    Order Specific Question:   Supervising Provider    Answer:   Alexander Reeves [[id number]]  . loratadine (CLARITIN) 10 MG tablet    Sig: Take 1 tablet (10 mg total) by mouth daily.    Dispense:  30 tablet    Refill:  11    Order Specific Question:   Supervising Provider    Answer:   Alexander Reeves [[id number]]  . ipratropium-albuterol (DUONEB) 0.5-2.5 (3) MG/3ML SOLN    Sig: Take 3 mLs by nebulization every 4 (four) hours as needed.    Dispense:  360 mL    Refill:  3    Order Specific Question:   Supervising Provider    Answer:   Alexander Reeves [[id number]]  . fluticasone furoate-vilanterol (BREO ELLIPTA) 200-25 MCG/INH AEPB    Sig: Inhale 1 puff into the lungs daily.    Dispense:  60 each    Refill:  11    Order Specific Question:   Supervising Provider    Answer:   Alexander Reeves [[id number]]  . albuterol (PROVENTIL HFA;VENTOLIN HFA) 108 (90 Base) MCG/ACT inhaler    Sig: Inhale 2 puffs into the lungs every 4 (four) hours as needed for wheezing or shortness of breath.    Dispense:  1 Inhaler    Refill:  11    Order Specific Question:   Supervising Provider    Answer:   Alexander Reeves [[id number]]    Follow-up: Return if symptoms worsen or fail to improve, for Will call with results. Will refer if appropriate.Alexander Demark PA

## 2017-08-31 NOTE — Progress Notes (Signed)
Patient thinks he has a cyst in testicles.

## 2017-08-31 NOTE — Patient Instructions (Signed)
Spermatocele A spermatocele is a fluid-filled sac (cyst) inside the scrotum. This type of cyst often forms at the top of the testicle where sperm is stored (epididymis). The cyst sometimes forms along the tube that carries sperm away from the epididymis (vas deferens). Spermatoceles are usually painless. Most cysts are small, but they can grow larger. Spermatoceles are not cancerous (are benign). What are the causes? The cause of this condition is not known. What are the signs or symptoms? In most cases, small cysts do not cause symptoms. If you do have symptoms, they may include:  Dull pain.  A feeling of heaviness.  An enlargement of your scrotum, if the cyst is large.  How is this diagnosed? This condition is diagnosed based on a physical exam. You or your health care provider may notice the cyst when feeling your scrotum. Your provider may shine a light through (transilluminate) your scrotum to see if light will pass through the cyst. You may also have an ultrasound of your scrotum to rule out a tumor. How is this treated? Small spermatoceles do not need to be treated. If the spermatocele has grown large or is uncomfortable, surgery to remove the cyst may be recommended. Follow these instructions at home:  Watch your spermatocele for any changes.  Keep all follow-up visits as told by your health care provider. This is important. Contact a health care provider if:  Your spermatocele gets larger.  You have pain in your scrotum.  Your spermatocele comes back after treatment. This information is not intended to replace advice given to you by your health care provider. Make sure you discuss any questions you have with your health care provider. Document Released: 04/23/2015 Document Revised: 08/27/2015 Document Reviewed: 01/14/2015 Elsevier Interactive Patient Education  [employer].

## 2017-09-07 ENCOUNTER — Ambulatory Visit (HOSPITAL_COMMUNITY)
Admission: RE | Admit: 2017-09-07 | Discharge: 2017-09-07 | Disposition: A | Discharge status: Home / Self Care | Payer: Medicare Other | Source: Ambulatory Visit | Attending: Physician Assistant | Admitting: Physician Assistant

## 2017-09-07 DIAGNOSIS — I861 Scrotal varices: Secondary | ICD-10-CM | POA: Insufficient documentation

## 2017-09-07 DIAGNOSIS — N5089 Other specified disorders of the male genital organs: Secondary | ICD-10-CM

## 2017-09-07 DIAGNOSIS — N50819 Testicular pain, unspecified: Secondary | ICD-10-CM | POA: Diagnosis not present

## 2017-09-07 DIAGNOSIS — N509 Disorder of male genital organs, unspecified: Secondary | ICD-10-CM | POA: Insufficient documentation

## 2017-09-07 DIAGNOSIS — R9389 Abnormal findings on diagnostic imaging of other specified body structures: Secondary | ICD-10-CM | POA: Diagnosis not present

## 2017-09-08 ENCOUNTER — Other Ambulatory Visit (INDEPENDENT_AMBULATORY_CARE_PROVIDER_SITE_OTHER): Payer: Self-pay | Admitting: Physician Assistant

## 2017-09-08 DIAGNOSIS — I861 Scrotal varices: Secondary | ICD-10-CM

## 2017-09-09 ENCOUNTER — Telehealth (INDEPENDENT_AMBULATORY_CARE_PROVIDER_SITE_OTHER): Payer: Self-pay

## 2017-09-09 NOTE — Telephone Encounter (Signed)
Patient is aware that [place] results are back and he has been referred to urology for consideration of further imaging, treatment and evaluation. He is aware that someone from urology will contact him to schedule appointment.  Nat Christen, CMA

## 2017-09-09 NOTE — Telephone Encounter (Signed)
-----   Message from Clent Demark, PA-C sent at 09/08/2017 12:06 PM EDT ----- I have made referral to urologist. Urologist may consider further imaging.

## 2017-09-10 ENCOUNTER — Telehealth (INDEPENDENT_AMBULATORY_CARE_PROVIDER_SITE_OTHER): Payer: Self-pay | Admitting: Physician Assistant

## 2017-09-10 MED FILL — VENTOLIN HFA 90 MCG INHALER: 108 (90 BAS | 16 days supply | Qty: 18 | Fill #0

## 2017-09-10 MED FILL — MONTELUKAST SOD 10 MG TAB: 10 | 30 days supply | Qty: 30 | Fill #0

## 2017-09-10 NOTE — Telephone Encounter (Signed)
Patient called and left a voicemail about wanting better understanding on who would schedule the appointment for urology. Front desk called back and advice patient that he should wait at least 1 or 2 weeks before urology could get in contact with him to schedule an appointment. [name]

## 2017-09-10 NOTE — Telephone Encounter (Signed)
Noted. Navie Lamoreaux S Simonne Boulos, CMA  

## 2017-11-18 MED FILL — MONTELUKAST SOD 10 MG TAB: 10 | 30 days supply | Qty: 30 | Fill #1

## 2017-11-18 MED FILL — VENTOLIN HFA 90 MCG INHALER: 108 (90 BAS | 16 days supply | Qty: 18 | Fill #1

## 2017-12-15 ENCOUNTER — Other Ambulatory Visit: Payer: Self-pay | Admitting: Urology

## 2017-12-15 DIAGNOSIS — I861 Scrotal varices: Secondary | ICD-10-CM | POA: Diagnosis not present

## 2017-12-15 DIAGNOSIS — D4959 Neoplasm of unspecified behavior of other genitourinary organ: Secondary | ICD-10-CM | POA: Diagnosis not present

## 2017-12-15 DIAGNOSIS — N50811 Right testicular pain: Secondary | ICD-10-CM | POA: Diagnosis not present

## 2017-12-15 DIAGNOSIS — N503 Cyst of epididymis: Secondary | ICD-10-CM | POA: Diagnosis not present

## 2017-12-29 ENCOUNTER — Ambulatory Visit (HOSPITAL_COMMUNITY)
Admission: RE | Admit: 2017-12-29 | Discharge: 2017-12-29 | Disposition: A | Discharge status: Home / Self Care | Payer: Medicare Other | Source: Ambulatory Visit | Attending: Urology | Admitting: Urology

## 2017-12-29 DIAGNOSIS — K5730 Diverticulosis of large intestine without perforation or abscess without bleeding: Secondary | ICD-10-CM | POA: Diagnosis not present

## 2017-12-29 DIAGNOSIS — I861 Scrotal varices: Secondary | ICD-10-CM | POA: Diagnosis not present

## 2017-12-29 DIAGNOSIS — R93811 Abnormal radiologic findings on diagnostic imaging of right testicle: Secondary | ICD-10-CM | POA: Diagnosis not present

## 2017-12-31 ENCOUNTER — Emergency Department (HOSPITAL_COMMUNITY): Payer: Medicare Other

## 2017-12-31 ENCOUNTER — Emergency Department (HOSPITAL_COMMUNITY)
Admission: EM | Admit: 2017-12-31 | Discharge: 2017-12-31 | Disposition: A | Discharge status: Home / Self Care | Payer: Medicare Other | Attending: Emergency Medicine | Admitting: Emergency Medicine

## 2017-12-31 ENCOUNTER — Encounter (HOSPITAL_COMMUNITY): Payer: Self-pay

## 2017-12-31 DIAGNOSIS — J45901 Unspecified asthma with (acute) exacerbation: Secondary | ICD-10-CM | POA: Insufficient documentation

## 2017-12-31 DIAGNOSIS — Z87891 Personal history of nicotine dependence: Secondary | ICD-10-CM | POA: Diagnosis not present

## 2017-12-31 DIAGNOSIS — Z79899 Other long term (current) drug therapy: Secondary | ICD-10-CM | POA: Diagnosis not present

## 2017-12-31 DIAGNOSIS — R000 Tachycardia, unspecified: Secondary | ICD-10-CM | POA: Diagnosis not present

## 2017-12-31 DIAGNOSIS — R062 Wheezing: Secondary | ICD-10-CM | POA: Diagnosis not present

## 2017-12-31 DIAGNOSIS — R0902 Hypoxemia: Secondary | ICD-10-CM | POA: Diagnosis not present

## 2017-12-31 DIAGNOSIS — J45909 Unspecified asthma, uncomplicated: Secondary | ICD-10-CM | POA: Diagnosis not present

## 2017-12-31 DIAGNOSIS — R079 Chest pain, unspecified: Secondary | ICD-10-CM | POA: Diagnosis not present

## 2017-12-31 DIAGNOSIS — R0602 Shortness of breath: Secondary | ICD-10-CM | POA: Diagnosis not present

## 2017-12-31 DIAGNOSIS — I10 Essential (primary) hypertension: Secondary | ICD-10-CM | POA: Diagnosis not present

## 2017-12-31 DIAGNOSIS — R05 Cough: Secondary | ICD-10-CM | POA: Diagnosis not present

## 2017-12-31 LAB — BASIC METABOLIC PANEL
Anion gap: 8 (ref 5–15)
BUN: 14 mg/dL (ref 6–20)
CALCIUM: 8.8 mg/dL — AB (ref 8.9–10.3)
CO2: 25 mmol/L (ref 22–32)
Chloride: 103 mmol/L (ref 98–111)
Creatinine, Ser: 1.19 mg/dL (ref 0.61–1.24)
GFR calc Af Amer: >60 mL/min (ref >60)
GFR calc non Af Amer: >60 mL/min (ref >60)
Glucose, Bld: 105 mg/dL — ABNORMAL HIGH (ref 70–99)
Potassium: 4.1 mmol/L (ref 3.5–5.1)
Sodium: 136 mmol/L (ref 135–145)

## 2017-12-31 LAB — CBC WITH DIFFERENTIAL/PLATELET
Abs Immature Granulocytes: 0.01 10*3/uL (ref 0.00–0.07)
Basophils Absolute: 0 10*3/uL (ref 0.0–0.1)
Basophils Relative: 0 %
EOS ABS: 0.3 10*3/uL (ref 0.0–0.5)
Eosinophils Relative: 3 %
HEMATOCRIT: 41.7 % (ref 39.0–52.0)
Hemoglobin: 13.2 g/dL (ref 13.0–17.0)
Immature Granulocytes: 0 %
Lymphocytes Relative: 26 %
Lymphs Abs: 2.4 10*3/uL (ref 0.7–4.0)
MCH: 26.8 pg (ref 26.0–34.0)
MCHC: 31.7 g/dL (ref 30.0–36.0)
MCV: 84.6 fL (ref 80.0–100.0)
Monocytes Absolute: 0.9 10*3/uL (ref 0.1–1.0)
Monocytes Relative: 10 %
Neutro Abs: 5.7 10*3/uL (ref 1.7–7.7)
Neutrophils Relative %: 61 %
Platelets: 198 10*3/uL (ref 150–400)
RBC: 4.93 MIL/uL (ref 4.22–5.81)
RDW: 14.8 % (ref 11.5–15.5)
WBC: 9.2 10*3/uL (ref 4.0–10.5)
nRBC: 0 % (ref 0.0–0.2)

## 2017-12-31 LAB — D-DIMER, QUANTITATIVE: D-Dimer, Quant: 0.34 ug/mL-FEU (ref 0.00–0.50)

## 2017-12-31 LAB — I-STAT TROPONIN, ED: Troponin i, poc: 0 ng/mL (ref 0.00–0.08)

## 2017-12-31 IMAGING — DX DG CHEST 2V
2 series · 2 of 2 positions shown · non-contrast
Comparison: [DATE]

CLINICAL DATA: Cough, chest pain, and shortness of breath for 2
days.

EXAM:
CHEST - 2 VIEW

[chest pa]
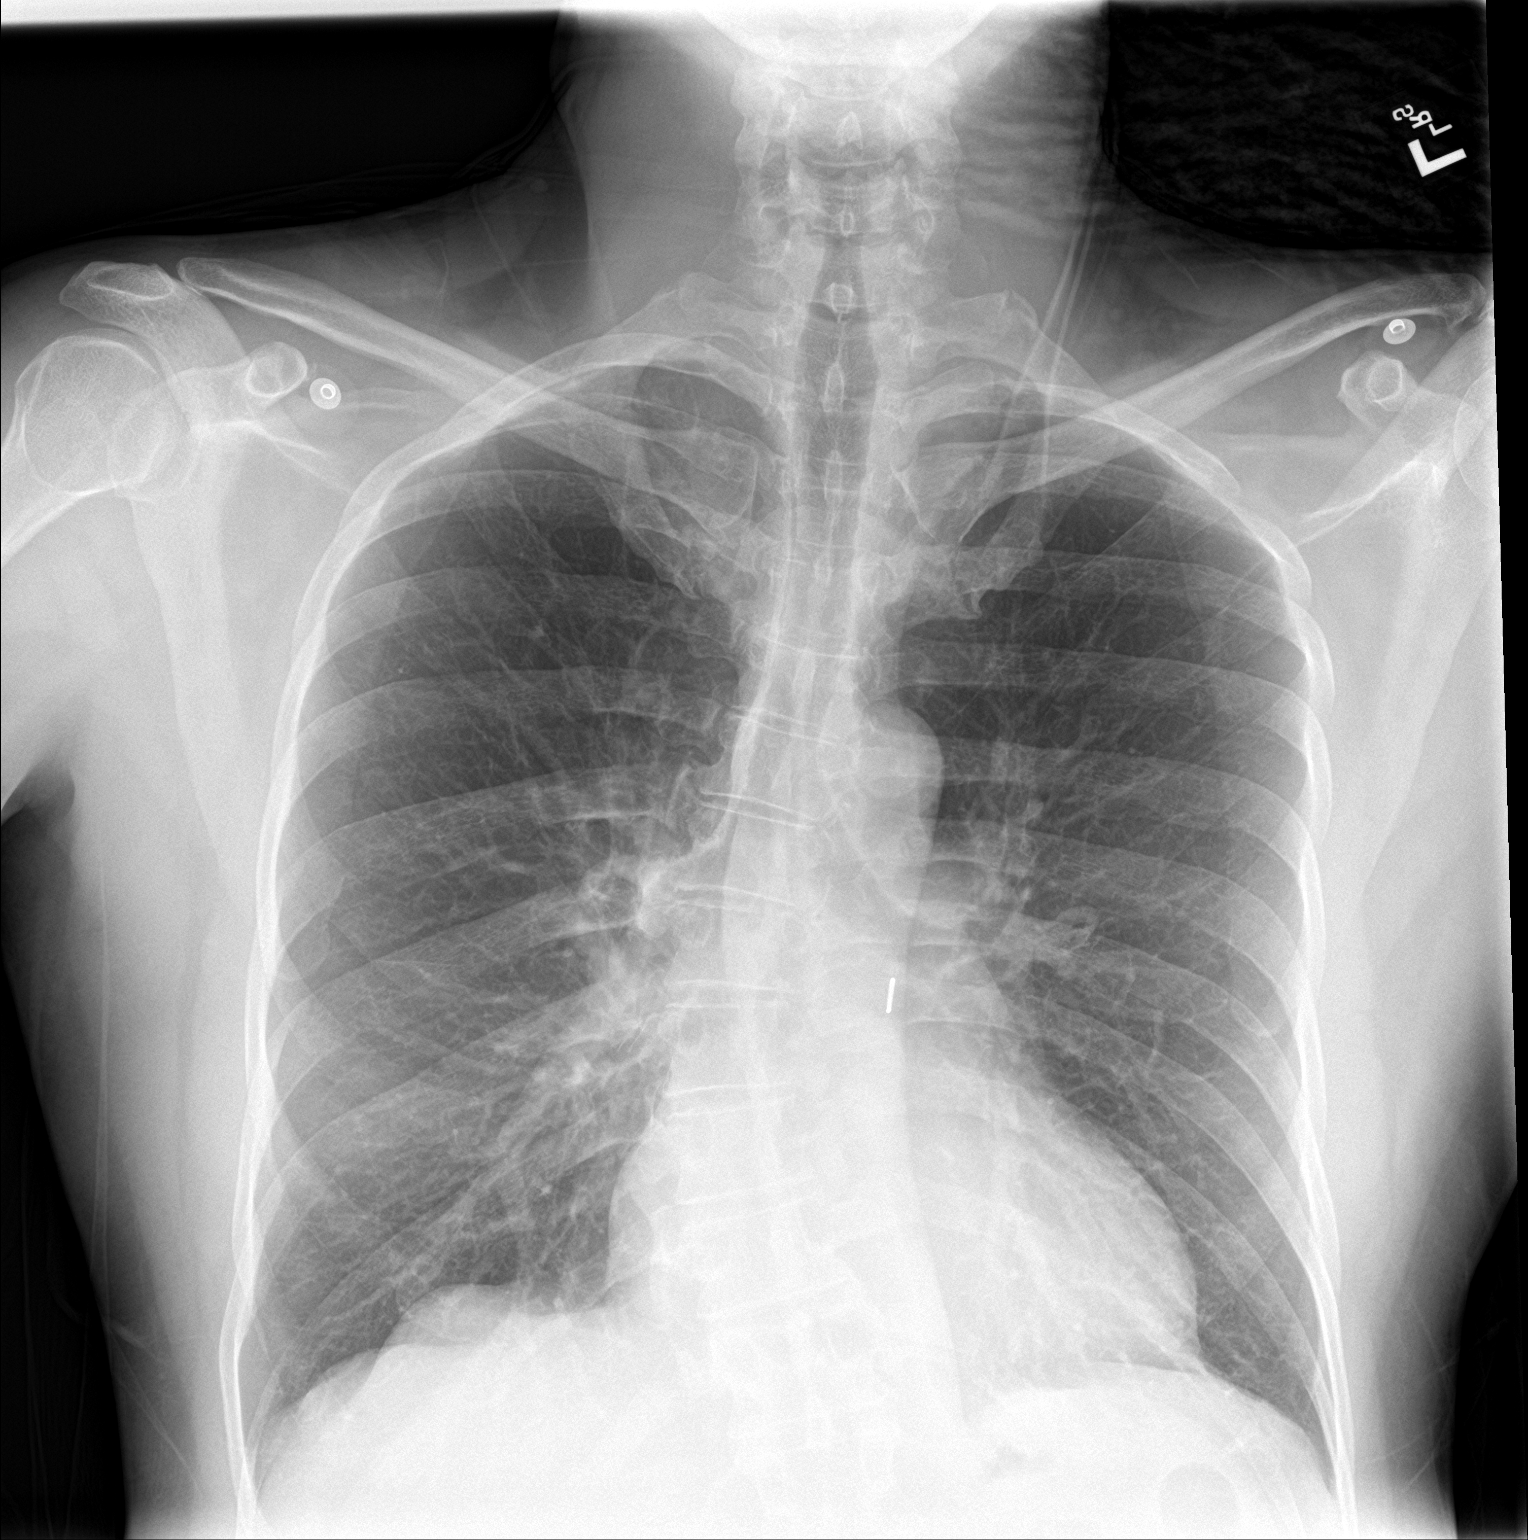

[chest lat]
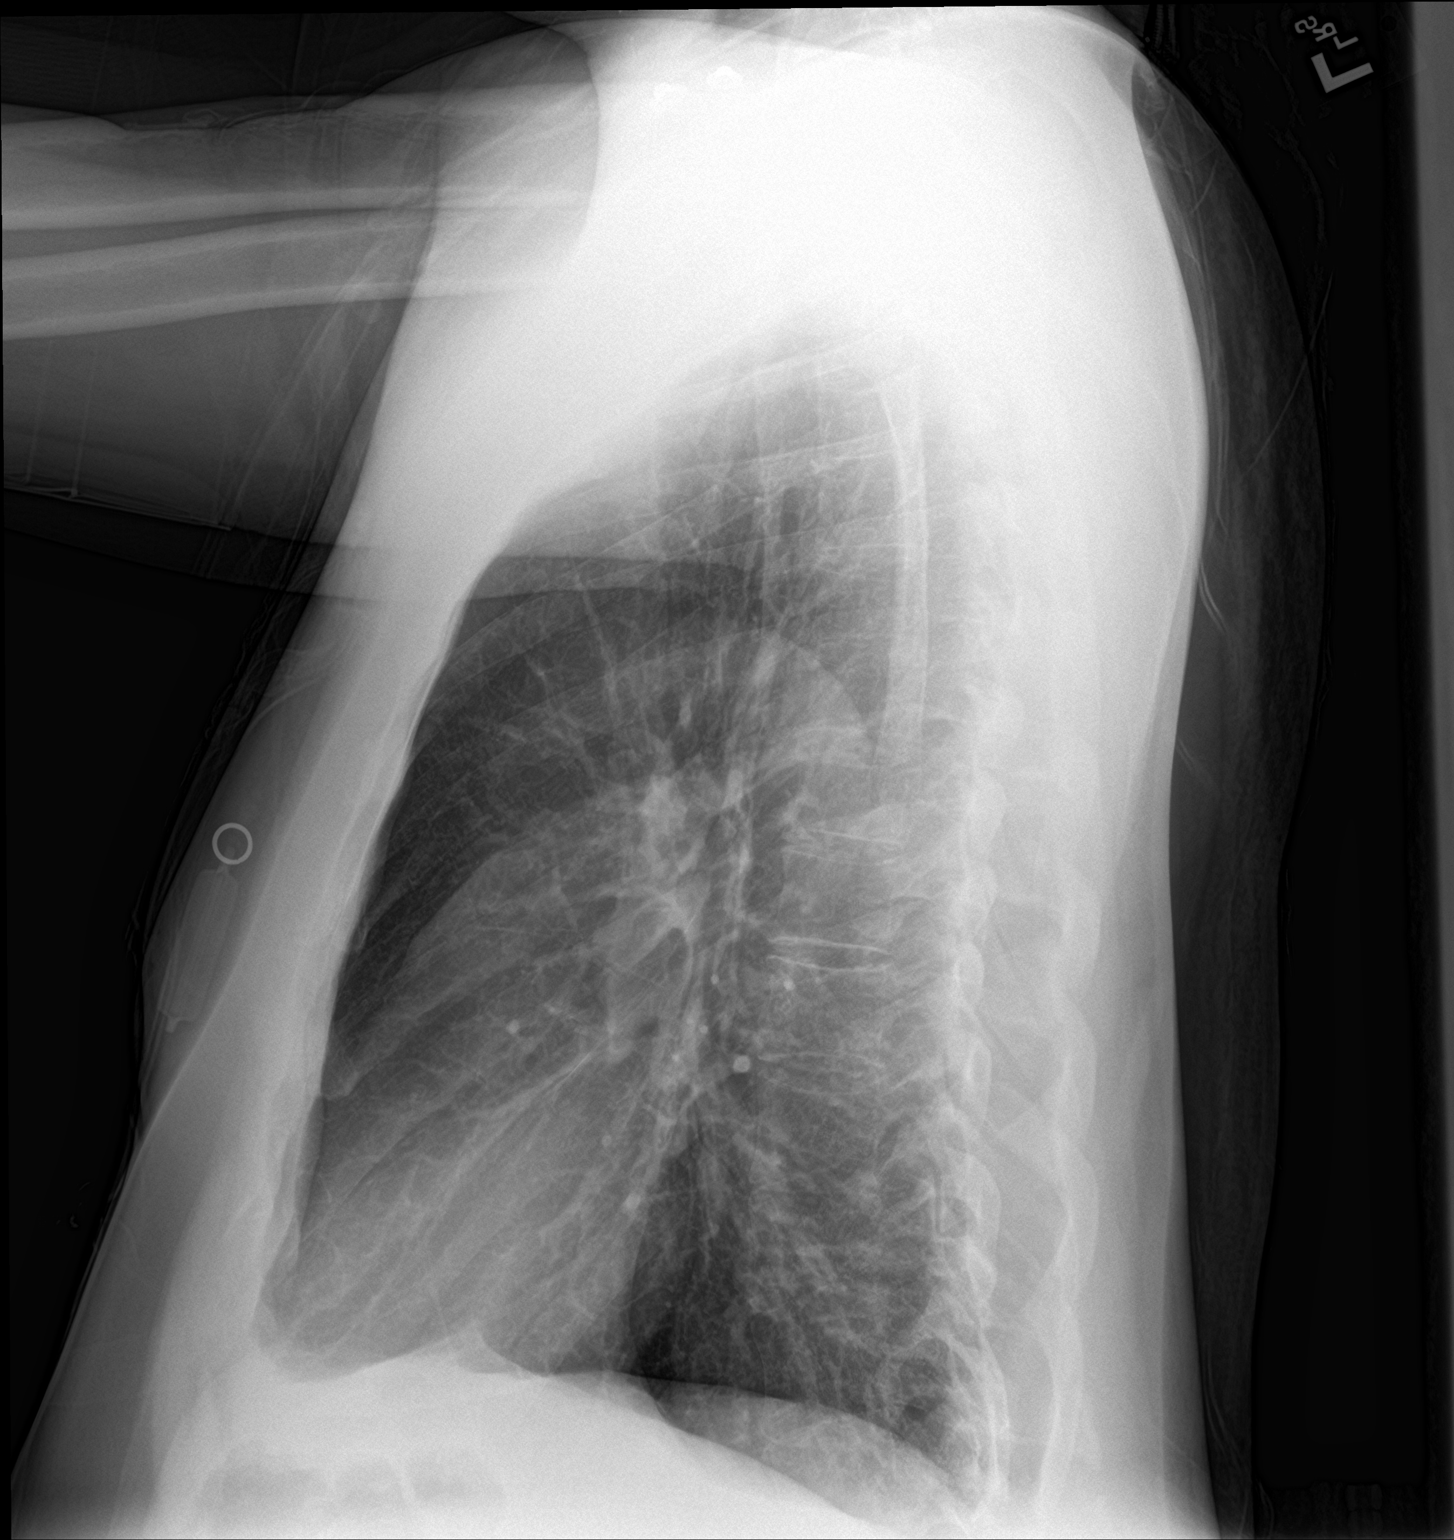

[2 of 2 positions shown; findings below may reference images not displayed]

FINDINGS: Hyperinflation. Probable emphysematous changes in the upper lungs.
Peribronchial thickening and central interstitial pattern suggesting
chronic bronchitis. No airspace disease or consolidation in the
lungs. No blunting of costophrenic angles. No pneumothorax.
Mediastinal contours appear intact. Thoracic scoliosis convex
towards the right.
IMPRESSION: Emphysematous and chronic bronchitic changes in the lungs. No
evidence of active pulmonary disease.

## 2017-12-31 MED ORDER — MORPHINE SULFATE (PF) 4 MG/ML IV SOLN
4.0000 mg | Freq: Once | INTRAVENOUS | Status: AC
  Administered 2017-12-31: 4 mg via INTRAVENOUS
  Filled 2017-12-31: qty 1

## 2017-12-31 MED ORDER — IPRATROPIUM-ALBUTEROL 0.5-2.5 (3) MG/3ML IN SOLN
3.0000 mL | Freq: Once | RESPIRATORY_TRACT | Status: AC
  Administered 2017-12-31: 3 mL via RESPIRATORY_TRACT
  Filled 2017-12-31: qty 3

## 2017-12-31 MED ORDER — PREDNISONE 20 MG PO TABS: 40.0000 mg | ORAL_TABLET | Freq: Every day | ORAL | 0 refills | Status: DC

## 2017-12-31 NOTE — ED Provider Notes (Signed)
[facility] DEPART[facility] Provider Note   CSN: [id number] Arrival date & time: 12/31/17  0256     History   Chief Complaint Chief Complaint  Patient presents with  . Asthma  . Shortness of Breath    HPI Alexander Reeves is a 52 y.o. male.  Patient with past medical history remarkable for asthma presents to the emergency department with a chief complaint of shortness of breath.  He reports having associated cough and congestion for the past 2 weeks.  States that he had shortness of breath which has gradually increased since last night.  He has tried using his home inhaler and nebulizer with little to no relief.  He was given to nebulizer treatments by EMS along with 125 mg of Solu-Medrol with little relief.  He states that he has 7 out of 10 chest..  The symptoms are worsened when he coughs.  He denies any radiating pain.  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    The history is provided by the patient. The history is limited by a language barrier. A language interpreter was used (ASL interpreter used).    Past Medical History:  Diagnosis Date  . Arthritis   . Asthma   . Deaf   . GERD (gastroesophageal reflux disease)   . Thyroid disease     Patient Active Problem List   Diagnosis Date Noted  . Asthma exacerbation 09/01/2016  . Chronic asthma, severe persistent, with acute exacerbation 08/10/2016  . Severe asthma 07/04/2016    Past Surgical History:  Procedure Laterality Date  . NO PAST SURGERIES          Home Medications    Prior to Admission medications   Medication Sig Start Date End Date Taking? Authorizing Provider  albuterol (PROVENTIL HFA;VENTOLIN HFA) 108 (90 Base) MCG/ACT inhaler Inhale 2 puffs into the lungs every 4 (four) hours as needed for wheezing or shortness of breath. 08/31/17   Clent Demark, PA-C  fluticasone furoate-vilanterol (BREO  ELLIPTA) 200-25 MCG/INH AEPB Inhale 1 puff into the lungs daily. 08/31/17   Clent Demark, PA-C  ipratropium-albuterol (DUONEB) 0.5-2.5 (3) MG/3ML SOLN Take 3 mLs by nebulization every 4 (four) hours as needed. 08/31/17   Clent Demark, PA-C  loratadine (CLARITIN) 10 MG tablet Take 1 tablet (10 mg total) by mouth daily. 08/31/17   Clent Demark, PA-C  montelukast (SINGULAIR) 10 MG tablet Take 1 tablet (10 mg total) by mouth at bedtime. 08/31/17   Clent Demark, PA-C    Family History Family History  Problem Relation Age of Onset  . Heart disease Father     Social History Social History   Tobacco Use  . Smoking status: Former Smoker    Types: Cigarettes    Last attempt to quit: 06/24/2016    Years since quitting: 1.5  . Smokeless tobacco: Never Used  Substance Use Topics  . Alcohol use: Yes    Comment: 2 weeks ago-vodka  . Drug use: Yes    Types: Marijuana    Comment: 3 days ago     Allergies   Cheese; Eggs or egg-derived products; and Penicillins   Review of Systems Review of Systems  All other systems reviewed and are negative.    Physical Exam Updated Vital Signs Ht 5\' 10"  (1.778 m)   Wt 72.6 kg   BMI 22.96 kg/m   Physical Exam Vitals signs and nursing note  reviewed.  Constitutional:      Appearance: He is well-developed.  HENT:     Head: Normocephalic and atraumatic.  Eyes:     General: No scleral icterus.       Right eye: No discharge.        Left eye: No discharge.     Conjunctiva/sclera: Conjunctivae normal.     Pupils: Pupils are equal, round, and reactive to light.  Neck:     Musculoskeletal: Normal range of motion and neck supple.     Vascular: No JVD.  Cardiovascular:     Rate and Rhythm: Normal rate and regular rhythm.     Heart sounds: Normal heart sounds. No murmur. No friction rub. No gallop.      Comments: Tachycardic, likely from albuterol treatment Pulmonary:     Effort: Pulmonary effort is normal. No respiratory  distress.     Breath sounds: No wheezing or rales.     Comments: Lower lobe wheezing Chest:     Chest wall: No tenderness.  Abdominal:     General: There is no distension.     Palpations: Abdomen is soft. There is no mass.     Tenderness: There is no abdominal tenderness. There is no guarding or rebound.  Musculoskeletal: Normal range of motion.        General: No tenderness.  Skin:    General: Skin is warm and dry.  Neurological:     Mental Status: He is alert and oriented to person, place, and time.  Psychiatric:        Behavior: Behavior normal.        Thought Content: Thought content normal.        Judgment: Judgment normal.      ED Treatments / Results  Labs (all labs ordered are listed, but only abnormal results are displayed) Labs Reviewed  BASIC METABOLIC PANEL - Abnormal; Notable for the following components:      Result Value   Glucose, Bld 105 (*)    Calcium 8.8 (*)    All other components within normal limits  CBC WITH DIFFERENTIAL/PLATELET  D-DIMER, QUANTITATIVE (NOT AT [facility])  I-STAT TROPONIN, ED    EKG EKG Interpretation  Date/Time:  Thursday December 31 2017 05:18:46 EST Ventricular Rate:  92 PR Interval:    QRS Duration: 87 QT Interval:  362 QTC Calculation: 448 R Axis:   86 Text Interpretation:  Sinus rhythm ST elev, probable normal early repol pattern No significant change was found Confirmed by Ezequiel Essex [phone]) on 12/31/2017 5:23:11 AM   Radiology  Procedures Procedures (including critical care time)  Medications Ordered in ED Medications  morphine 4 MG/ML injection 4 mg (has no administration in time range)     Initial Impression / Assessment and Plan / ED Course  I have reviewed the triage vital signs and the nursing notes.  Pertinent labs & imaging results that were available during my care of the patient were reviewed by me and considered in my medical decision making (see chart for details).    Patient with shortness  of breath.  His lungs are clear on my exam.  He still complains of 7 out of 10 chest pain.  This could be intercostal straining secondary to coughing.  We will need to consider other etiologies other than asthma exacerbation.  Will check chest x-ray, EKG, troponin, d-dimer.  Will reassess.  Patient feeling improved.  Ambulates maintaining 92%, denies feeling short of breath.  Will discharge to home with PCP  follow-up.    Final Clinical Impressions(s) / ED Diagnoses   Final diagnoses:  Exacerbation of persistent asthma, unspecified asthma severity    ED Discharge Orders         Ordered    predniSONE (DELTASONE) 20 MG tablet  Daily     12/31/17 0556           Montine Circle, PA-C 12/31/17 0559    Ezequiel Essex, MD 12/31/17 [phone]

## 2017-12-31 NOTE — Discharge Instructions (Addendum)
Consider getting an N95 mask or respirator for you job.  Return for worsening symptoms.  Continue using your inhalers.  Take the steroids as prescribed.

## 2017-12-31 NOTE — ED Notes (Signed)
Patient verbalizes understanding of medications and discharge instructions via translator. No further questions at this time. VSS and patient ambulatory at discharge.

## 2017-12-31 NOTE — ED Triage Notes (Signed)
Patient BIB [facility] for asthma. 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 Patient c/o cough and increased congestion over the last 2 week. States last night he had increased SOB with no relief from home inhaler. Given 2 Duo Neb by EMS and 125 mg Solumedrol .   BP 116/70 HR 110 100%

## 2017-12-31 NOTE — ED Notes (Signed)
Patients O2 sats dropped to 92% while ambulating but patient denies SOB.

## 2018-01-01 ENCOUNTER — Telehealth (INDEPENDENT_AMBULATORY_CARE_PROVIDER_SITE_OTHER): Payer: Self-pay | Admitting: Physician Assistant

## 2018-01-01 MED FILL — VENTOLIN HFA 90 MCG INHALER: 108 (90 BAS | 16 days supply | Qty: 18 | Fill #2

## 2018-01-01 MED FILL — LORATADINE 10 MG TABLET: 10 | 30 days supply | Qty: 30 | Fill #0

## 2018-01-01 MED FILL — predniSONE 20 MG TABS: 20 | 5 days supply | Qty: 10 | Fill #0

## 2018-01-01 NOTE — Telephone Encounter (Signed)
Patient called to request medication refill with an interpreter. I got in contact with [facility] pharmacy to request his refill but was told that  fluticasone furoate-vilanterol (BREO ELLIPTA) 200-25 MCG/INH AEPB   Was not in the system and had no record. Pharmacist stated she would need PCP to send a RX.  Patient also requested   ipratropium-albuterol (DUONEB) 0.5-2.5 (3) MG/3ML SOLN  But patient insurance does not cover the RX and it will cost him around $200  Please advice   Thank you [name]

## 2018-01-01 NOTE — Telephone Encounter (Signed)
I have made a prescription four months ago for all his asthma medications. I put 11 refills on his rx and sent them to [facility]. Have him go to [facility]. How has he been getting his refills thus far?

## 2018-01-04 NOTE — Telephone Encounter (Signed)
Per pharmacist at [facility] they do not have any record for [facility] for patient. Please send new Rx. Nat Christen, CMA

## 2018-01-07 ENCOUNTER — Other Ambulatory Visit (INDEPENDENT_AMBULATORY_CARE_PROVIDER_SITE_OTHER): Payer: Self-pay | Admitting: Physician Assistant

## 2018-01-07 DIAGNOSIS — Z760 Encounter for issue of repeat prescription: Secondary | ICD-10-CM

## 2018-01-07 MED ORDER — IPRATROPIUM-ALBUTEROL 0.5-2.5 (3) MG/3ML IN SOLN: 3.0000 mL | RESPIRATORY_TRACT | 3 refills | Status: DC | PRN

## 2018-01-07 MED ORDER — FLUTICASONE FUROATE-VILANTEROL 200-25 MCG/INH IN AEPB: 1.0000 | INHALATION_SPRAY | Freq: Every day | RESPIRATORY_TRACT | 11 refills | Status: DC

## 2018-01-07 MED FILL — BREO ELLIPTA 200-25 MCG INH: 200-25 | 30 days supply | Qty: 60 | Fill #0

## 2018-01-07 NOTE — Telephone Encounter (Signed)
Both medications sent to [facility]. Please notify him to pick up meds.

## 2018-01-12 NOTE — Telephone Encounter (Signed)
Several unsuccessful attempts at reaching patient. Nat Christen, CMA

## 2018-01-28 ENCOUNTER — Encounter (INDEPENDENT_AMBULATORY_CARE_PROVIDER_SITE_OTHER): Payer: Self-pay | Admitting: Critical Care Medicine

## 2018-01-28 ENCOUNTER — Ambulatory Visit (INDEPENDENT_AMBULATORY_CARE_PROVIDER_SITE_OTHER): Payer: Medicare Other | Admitting: Critical Care Medicine

## 2018-01-28 VITALS — BP 119/74 | HR 93 | Temp 97.7°F | Ht 70.0 in | Wt 180.6 lb

## 2018-01-28 DIAGNOSIS — J4551 Severe persistent asthma with (acute) exacerbation: Secondary | ICD-10-CM | POA: Diagnosis not present

## 2018-01-28 DIAGNOSIS — Z1211 Encounter for screening for malignant neoplasm of colon: Secondary | ICD-10-CM | POA: Diagnosis not present

## 2018-01-28 DIAGNOSIS — J45901 Unspecified asthma with (acute) exacerbation: Secondary | ICD-10-CM

## 2018-01-28 MED ORDER — ALBUTEROL SULFATE (2.5 MG/3ML) 0.083% IN NEBU: 2.5000 mg | INHALATION_SOLUTION | Freq: Four times a day (QID) | RESPIRATORY_TRACT | 12 refills | Status: DC | PRN

## 2018-01-28 MED ORDER — PREDNISONE 10 MG PO TABS: ORAL_TABLET | ORAL | 0 refills | Status: DC

## 2018-01-28 MED FILL — BREO ELLIPTA 200-25 MCG INH: 200-25 | 30 days supply | Qty: 60 | Fill #0

## 2018-01-28 MED FILL — VENTOLIN HFA 90 MCG INHALER: 108 (90 BAS | 16 days supply | Qty: 18 | Fill #3

## 2018-01-28 NOTE — Progress Notes (Signed)
Subjective:    Patient ID: Alexander Reeves, male    DOB: Jun 28, 1965, 53 y.o.   MRN: [id number]  53 y.o.M hx of asthma severe. Recent 12/19 ED visit for asthma flare. Here for f/u and need for work papers  This patient has lifelong asthma diagnosed at age of 80 and has significant hypersensitivity to dust, chemicals and pollen.  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  He is now off the prednisone.  See asthma assessment below  The patient is seen today with a benefit of a interpreter for sign language  Asthma  He complains of chest tightness, cough, difficulty breathing, frequent throat clearing, shortness of breath, sputum production and wheezing. This is a chronic problem. The current episode started more than 1 year ago. The problem occurs constantly. The problem has been unchanged. The cough is productive of sputum. Associated symptoms include PND and a sore throat. Pertinent negatives include no dyspnea on exertion, ear congestion, ear pain or postnasal drip. Associated symptoms comments: Mucus in Dec . His symptoms are aggravated by occupational exposure, exposure to smoke, exposure to fumes, pollen and change in weather. His symptoms are alleviated by steroid inhaler, oral steroids and beta-agonist. He reports significant improvement on treatment. His past medical history is significant for asthma and pneumonia.    Past Medical History:  Diagnosis Date  . Arthritis   . Asthma   . Deaf   . GERD (gastroesophageal reflux disease)   . Thyroid disease      Family History  Problem Relation Age of Onset  . Heart disease Father      Social History   Socioeconomic History  . Marital status: Single    Spouse name: Not on file  . Number of children: Not on file  . Years of education: Not on file  . Highest education  level: Not on file  Occupational History  . Not on file  Social Needs  . Financial resource strain: Not on file  . Food insecurity:    Worry: Not on file    Inability: Not on file  . Transportation needs:    Medical: Not on file    Non-medical: Not on file  Tobacco Use  . Smoking status: Former Smoker    Types: Cigarettes    Last attempt to quit: 06/24/2016    Years since quitting: 1.5  . Smokeless tobacco: Never Used  Substance and Sexual Activity  . Alcohol use: Yes    Comment: 2 weeks ago-vodka  . Drug use: Yes    Types: Marijuana    Comment: 3 days ago  . Sexual activity: Never    Birth control/protection: None  Lifestyle  . Physical activity:    Days per week: Not on file    Minutes per session: Not on file  . Stress: Not on file  Relationships  . Social connections:    Talks on phone: Not on file    Gets together: Not on file    Attends religious service: Not on file    Active member of club or organization: Not on file    Attends meetings of clubs or organizations: Not on file    Relationship status: Not on file  . Intimate partner violence:    Fear of current or ex partner: Not on file  Emotionally abused: Not on file    Physically abused: Not on file    Forced sexual activity: Not on file  Other Topics Concern  . Not on file  Social History Narrative   [facility] (09/01/16):   No new pets. Moved from [place]. Questionable mold exposure at his previous residence and possibly some at his new apartment as well.      Allergies  Allergen Reactions  . Cheese Shortness Of Breath and Swelling    Swelling of the throat  . Eggs Or Egg-Derived Products Shortness Of Breath and Swelling    Swelling of the throat  . Penicillins Anaphylaxis    Has patient had a PCN reaction causing immediate rash, facial/tongue/throat swelling, SOB or lightheadedness with hypotension: Yes Has patient had a PCN reaction causing severe rash involving mucus membranes or skin  necrosis: Unknown Has patient had a PCN reaction that required hospitalization: Unknown Has patient had a PCN reaction occurring within the last 10 years: Unknown If all of the above answers are "NO", then may proceed with Cephalosporin use.      Outpatient Medications Prior to Visit  Medication Sig Dispense Refill  . albuterol (PROVENTIL HFA;VENTOLIN HFA) 108 (90 Base) MCG/ACT inhaler Inhale 2 puffs into the lungs every 4 (four) hours as needed for wheezing or shortness of breath. 1 Inhaler 11  . fluticasone furoate-vilanterol (BREO ELLIPTA) 200-25 MCG/INH AEPB Inhale 1 puff into the lungs daily. 60 each 11  . loratadine (CLARITIN) 10 MG tablet Take 1 tablet (10 mg total) by mouth daily. 30 tablet 11  . montelukast (SINGULAIR) 10 MG tablet Take 1 tablet (10 mg total) by mouth at bedtime. 30 tablet 11  . ipratropium-albuterol (DUONEB) 0.5-2.5 (3) MG/3ML SOLN Take 3 mLs by nebulization every 4 (four) hours as needed. 360 mL 3  . predniSONE (DELTASONE) 20 MG tablet Take 2 tablets (40 mg total) by mouth daily. 10 tablet 0   No facility-administered medications prior to visit.       Review of Systems  HENT: Positive for sore throat. Negative for ear pain and postnasal drip.   Respiratory: Positive for cough, sputum production, shortness of breath and wheezing.   Cardiovascular: Positive for PND. Negative for dyspnea on exertion.       Objective:   Physical Exam Vitals:   01/28/18 1549  BP: 119/74  Pulse: 93  Temp: 97.7 F (36.5 C)  TempSrc: Oral  SpO2: 92%  Weight: 180 lb 9.6 oz (81.9 kg)  Height: 5\' 10"  (1.778 m)    Gen: Pleasant, well-nourished, in no distress,  normal affect  ENT: No lesions,  mouth clear,  oropharynx clear, no postnasal drip Note using sign language d/t deaf status  Neck: No JVD, no TMG, no carotid bruits  Lungs: No use of accessory muscles, no dullness to percussion, exp wheezes  Cardiovascular: RRR, heart sounds normal, no murmur or gallops, no  peripheral edema  Abdomen: soft and NT, no HSM,  BS normal  Musculoskeletal: No deformities, no cyanosis or clubbing  Neuro: alert, non focal  Skin: Warm, no lesions or rashes  No results found.        Assessment & Plan:   I personally reviewed all images and lab data in the [facility] system as well as any outside material available during this office visit and agree with the  radiology impressions.   Chronic asthma, severe persistent, with acute exacerbation Severe persistent asthma with significant exposure on the job of chemicals resulting in destabilization of  airways  Plan I went over with the patient proper use of the Breo inhaler and he is to continue this at 1 puff daily  The patient states he had not been using this every day as he did not have the funds to purchase the inhalers regularly and therefore was saving up the medication and taking it as needed  We will assist the patient with obtaining the Breo inhaler and he is to continue this 1 puff daily  We will need to begin prednisone at a pulsed dose 40 mg daily for 5 days  Work release papers were given 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 and hopefully vocational rehab can place the patient into a safer job environment  Asthma exacerbation See other asthma assessment   Now exacerbating   Diagnoses and all orders for this visit:  Severe persistent asthma with acute exacerbation  Colon cancer screening -     Fecal occult blood, imunochemical; Future  Chronic asthma, severe persistent, with acute exacerbation  Exacerbation of persistent asthma, unspecified asthma severity  Other orders -     predniSONE (DELTASONE) 10 MG tablet; Take 4 tablets daily for 5 days then stop -     albuterol (PROVENTIL) (2.5 MG/3ML) 0.083% nebulizer solution; Take 3 mLs (2.5 mg total) by nebulization every 6 (six) hours as needed for wheezing or shortness of breath.

## 2018-01-28 NOTE — Assessment & Plan Note (Signed)
See other asthma assessment   Now exacerbating

## 2018-01-28 NOTE — Assessment & Plan Note (Signed)
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

## 2018-01-28 NOTE — Patient Instructions (Signed)
Begin prednisone 10 mg tablet take 4 daily for 5 days until complete  Work excuse letters were produced today for vocational rehab and your former employer  We are looking into patient assistance for your Breo inhaler  Continue to use Breo 1 inhalation daily and remember to take a slow deep breath on the inhaler  We are stopping ipratroprium, use only albuterol in your nebulizer machine.   Return 3 months

## 2018-01-29 MED FILL — predniSONE 10 MG TABS: 10 | 5 days supply | Qty: 20 | Fill #0

## 2018-02-18 DIAGNOSIS — N50811 Right testicular pain: Secondary | ICD-10-CM | POA: Diagnosis not present

## 2018-02-18 DIAGNOSIS — I861 Scrotal varices: Secondary | ICD-10-CM | POA: Diagnosis not present

## 2018-02-18 DIAGNOSIS — N503 Cyst of epididymis: Secondary | ICD-10-CM | POA: Diagnosis not present

## 2018-02-23 ENCOUNTER — Other Ambulatory Visit: Payer: Self-pay | Admitting: Urology

## 2018-03-02 ENCOUNTER — Encounter (HOSPITAL_BASED_OUTPATIENT_CLINIC_OR_DEPARTMENT_OTHER): Payer: Self-pay | Admitting: *Deleted

## 2018-03-02 ENCOUNTER — Other Ambulatory Visit: Payer: Self-pay

## 2018-03-02 NOTE — Progress Notes (Signed)
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

## 2018-03-19 NOTE — Progress Notes (Signed)
Spoke w Pam at [facility].  Pt will be called regarding time of arrival . 03/22/2018 @ 1145.

## 2018-03-19 NOTE — Progress Notes (Signed)
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

## 2018-03-19 NOTE — Progress Notes (Signed)
email confirmation for sign language interpreter on chart

## 2018-03-22 ENCOUNTER — Ambulatory Visit (HOSPITAL_BASED_OUTPATIENT_CLINIC_OR_DEPARTMENT_OTHER)
Admission: RE | Admit: 2018-03-22 | Discharge: 2018-03-22 | Disposition: A | Discharge status: Home / Self Care | Payer: Medicare Other | Attending: Urology | Admitting: Urology

## 2018-03-22 ENCOUNTER — Ambulatory Visit (HOSPITAL_BASED_OUTPATIENT_CLINIC_OR_DEPARTMENT_OTHER): Payer: Medicare Other | Admitting: Anesthesiology

## 2018-03-22 ENCOUNTER — Other Ambulatory Visit: Payer: Self-pay

## 2018-03-22 ENCOUNTER — Encounter (HOSPITAL_BASED_OUTPATIENT_CLINIC_OR_DEPARTMENT_OTHER)
Admission: RE | Disposition: A | Discharge status: Home / Self Care | Payer: Self-pay | Source: Home / Self Care | Attending: Urology

## 2018-03-22 ENCOUNTER — Encounter (HOSPITAL_BASED_OUTPATIENT_CLINIC_OR_DEPARTMENT_OTHER): Payer: Self-pay | Admitting: Anesthesiology

## 2018-03-22 DIAGNOSIS — N503 Cyst of epididymis: Secondary | ICD-10-CM | POA: Diagnosis not present

## 2018-03-22 DIAGNOSIS — Z880 Allergy status to penicillin: Secondary | ICD-10-CM | POA: Diagnosis not present

## 2018-03-22 DIAGNOSIS — N5089 Other specified disorders of the male genital organs: Secondary | ICD-10-CM | POA: Diagnosis not present

## 2018-03-22 DIAGNOSIS — J45909 Unspecified asthma, uncomplicated: Secondary | ICD-10-CM | POA: Diagnosis not present

## 2018-03-22 DIAGNOSIS — F17200 Nicotine dependence, unspecified, uncomplicated: Secondary | ICD-10-CM | POA: Insufficient documentation

## 2018-03-22 DIAGNOSIS — J4550 Severe persistent asthma, uncomplicated: Secondary | ICD-10-CM | POA: Diagnosis not present

## 2018-03-22 DIAGNOSIS — K219 Gastro-esophageal reflux disease without esophagitis: Secondary | ICD-10-CM | POA: Diagnosis not present

## 2018-03-22 HISTORY — DX: Follicular cyst of the skin and subcutaneous tissue, unspecified: L72.9

## 2018-03-22 HISTORY — DX: Frequency of micturition: R35.0

## 2018-03-22 HISTORY — PX: EPIDIDYMECTOMY: SHX6275

## 2018-03-22 SURGERY — EPIDIDYMECTOMY
Anesthesia: General | Site: Scrotum | Laterality: Right

## 2018-03-22 MED ORDER — ONDANSETRON HCL 4 MG/2ML IJ SOLN
INTRAMUSCULAR | Status: DC | PRN
  Administered 2018-03-22: 4 mg via INTRAVENOUS

## 2018-03-22 MED ORDER — LIDOCAINE 2% (20 MG/ML) 5 ML SYRINGE
INTRAMUSCULAR | Status: DC | PRN
  Administered 2018-03-22: 100 mg via INTRAVENOUS

## 2018-03-22 MED ORDER — OXYCODONE HCL 5 MG/5ML PO SOLN
5.0000 mg | Freq: Once | ORAL | Status: DC | PRN
  Filled 2018-03-22: qty 5

## 2018-03-22 MED ORDER — CLINDAMYCIN PHOSPHATE 900 MG/50ML IV SOLN
900.0000 mg | Freq: Once | INTRAVENOUS | Status: AC
  Administered 2018-03-22: 900 mg via INTRAVENOUS
  Filled 2018-03-22: qty 50

## 2018-03-22 MED ORDER — MIDAZOLAM HCL 2 MG/2ML IJ SOLN
INTRAMUSCULAR | Status: AC
  Filled 2018-03-22: qty 2

## 2018-03-22 MED ORDER — PROPOFOL 10 MG/ML IV BOLUS
INTRAVENOUS | Status: AC
  Filled 2018-03-22: qty 20

## 2018-03-22 MED ORDER — HYDROCODONE-ACETAMINOPHEN 5-325 MG PO TABS: 1.0000 | ORAL_TABLET | ORAL | 0 refills | Status: DC | PRN

## 2018-03-22 MED ORDER — CLINDAMYCIN PHOSPHATE 900 MG/50ML IV SOLN
INTRAVENOUS | Status: AC
  Filled 2018-03-22: qty 50

## 2018-03-22 MED ORDER — LIDOCAINE 2% (20 MG/ML) 5 ML SYRINGE
INTRAMUSCULAR | Status: AC
  Filled 2018-03-22: qty 5

## 2018-03-22 MED ORDER — FENTANYL CITRATE (PF) 100 MCG/2ML IJ SOLN
25.0000 ug | INTRAMUSCULAR | Status: DC | PRN
  Filled 2018-03-22: qty 1

## 2018-03-22 MED ORDER — MIDAZOLAM HCL 5 MG/5ML IJ SOLN
INTRAMUSCULAR | Status: DC | PRN
  Administered 2018-03-22: 2 mg via INTRAVENOUS

## 2018-03-22 MED ORDER — OXYCODONE HCL 5 MG PO TABS
5.0000 mg | ORAL_TABLET | Freq: Once | ORAL | Status: DC | PRN
  Filled 2018-03-22: qty 1

## 2018-03-22 MED ORDER — LACTATED RINGERS IV SOLN
INTRAVENOUS | Status: DC
  Administered 2018-03-22: 1000 mL via INTRAVENOUS
  Administered 2018-03-22: 17:00:00 via INTRAVENOUS
  Filled 2018-03-22: qty 1000

## 2018-03-22 MED ORDER — ONDANSETRON HCL 4 MG/2ML IJ SOLN
4.0000 mg | Freq: Once | INTRAMUSCULAR | Status: DC | PRN
  Filled 2018-03-22: qty 2

## 2018-03-22 MED ORDER — DEXAMETHASONE SODIUM PHOSPHATE 10 MG/ML IJ SOLN
INTRAMUSCULAR | Status: DC | PRN
  Administered 2018-03-22: 10 mg via INTRAVENOUS

## 2018-03-22 MED ORDER — FENTANYL CITRATE (PF) 100 MCG/2ML IJ SOLN
INTRAMUSCULAR | Status: DC | PRN
  Administered 2018-03-22 (×2): 50 ug via INTRAVENOUS

## 2018-03-22 MED ORDER — ONDANSETRON HCL 4 MG/2ML IJ SOLN
INTRAMUSCULAR | Status: AC
  Filled 2018-03-22: qty 2

## 2018-03-22 MED ORDER — BUPIVACAINE HCL (PF) 0.25 % IJ SOLN
INTRAMUSCULAR | Status: DC | PRN
  Administered 2018-03-22: 10 mL

## 2018-03-22 MED ORDER — KETOROLAC TROMETHAMINE 30 MG/ML IJ SOLN
INTRAMUSCULAR | Status: DC | PRN
  Administered 2018-03-22: 30 mg via INTRAVENOUS

## 2018-03-22 MED ORDER — FENTANYL CITRATE (PF) 100 MCG/2ML IJ SOLN
INTRAMUSCULAR | Status: AC
  Filled 2018-03-22: qty 2

## 2018-03-22 MED ORDER — PROPOFOL 10 MG/ML IV BOLUS
INTRAVENOUS | Status: DC | PRN
  Administered 2018-03-22: 170 mg via INTRAVENOUS

## 2018-03-22 MED ORDER — DEXAMETHASONE SODIUM PHOSPHATE 10 MG/ML IJ SOLN
INTRAMUSCULAR | Status: AC
  Filled 2018-03-22: qty 1

## 2018-03-22 SURGICAL SUPPLY — 39 items
BLADE CLIPPER SENSICLIP SURGIC (BLADE) IMPLANT
BLADE SURG 15 STRL LF DISP TIS (BLADE) ×1 IMPLANT
BLADE SURG 15 STRL SS (BLADE) ×2
BNDG GAUZE ELAST 4 BULKY (GAUZE/BANDAGES/DRESSINGS) ×3 IMPLANT
BRIEF STRETCH FOR OB PAD LRG (UNDERPADS AND DIAPERS) IMPLANT
CLEANER CAUTERY TIP 5X5 PAD (MISCELLANEOUS) ×1 IMPLANT
COVER BACK TABLE 60X90IN (DRAPES) ×3 IMPLANT
COVER MAYO STAND STRL (DRAPES) ×3 IMPLANT
COVER WAND RF STERILE (DRAPES) ×6 IMPLANT
DERMABOND ADVANCED (GAUZE/BANDAGES/DRESSINGS) ×2
DERMABOND ADVANCED .7 DNX12 (GAUZE/BANDAGES/DRESSINGS) ×1 IMPLANT
DRAIN PENROSE 18X1/4 LTX STRL (WOUND CARE) ×3 IMPLANT
DRAPE LAPAROTOMY 100X72 PEDS (DRAPES) ×3 IMPLANT
ELECT REM PT RETURN 9FT ADLT (ELECTROSURGICAL) ×3
ELECTRODE REM PT RTRN 9FT ADLT (ELECTROSURGICAL) ×1 IMPLANT
GAUZE SPONGE 4X4 12PLY STRL (GAUZE/BANDAGES/DRESSINGS) ×3 IMPLANT
GLOVE BIO SURGEON STRL SZ7.5 (GLOVE) ×9 IMPLANT
GLOVE BIOGEL PI IND STRL 7.5 (GLOVE) ×3 IMPLANT
GLOVE BIOGEL PI INDICATOR 7.5 (GLOVE) ×6
GOWN STRL REUS W/ TWL XL LVL3 (GOWN DISPOSABLE) ×1 IMPLANT
GOWN STRL REUS W/TWL XL LVL3 (GOWN DISPOSABLE) ×2
KIT TURNOVER CYSTO (KITS) ×3 IMPLANT
NEEDLE HYPO 25X1 1.5 SAFETY (NEEDLE) ×3 IMPLANT
NS IRRIG 500ML POUR BTL (IV SOLUTION) IMPLANT
PACK BASIN DAY SURGERY FS (CUSTOM PROCEDURE TRAY) ×3 IMPLANT
PAD CLEANER CAUTERY TIP 5X5 (MISCELLANEOUS) ×2
PENCIL BUTTON HOLSTER BLD 10FT (ELECTRODE) ×3 IMPLANT
SUPPORT SCROTAL LG STRP (MISCELLANEOUS) ×2 IMPLANT
SUPPORTER ATHLETIC LG (MISCELLANEOUS) ×1
SUT CHROMIC 3 0 PS 2 (SUTURE) IMPLANT
SUT MNCRL AB 4-0 PS2 18 (SUTURE) ×6 IMPLANT
SUT VIC AB 3-0 SH 27 (SUTURE) ×2
SUT VIC AB 3-0 SH 27X BRD (SUTURE) ×1 IMPLANT
SYR CONTROL 10ML LL (SYRINGE) ×3 IMPLANT
TOWEL OR 17X26 10 PK STRL BLUE (TOWEL DISPOSABLE) ×6 IMPLANT
TRAY DSU PREP LF (CUSTOM PROCEDURE TRAY) ×3 IMPLANT
TUBE CONNECTING 12'X1/4 (SUCTIONS) ×1
TUBE CONNECTING 12X1/4 (SUCTIONS) ×2 IMPLANT
YANKAUER SUCT BULB TIP NO VENT (SUCTIONS) ×3 IMPLANT

## 2018-03-22 NOTE — Anesthesia Preprocedure Evaluation (Signed)
Anesthesia Evaluation  Patient identified by MRN, date of birth, ID band Patient awake    Reviewed: Allergy & Precautions, NPO status , Patient's Chart, lab work & pertinent test results  History of Anesthesia Complications Negative for: history of anesthetic complications  Airway Mallampati: II  TM Distance: >3 FB Neck ROM: Full    Dental no notable dental hx. (+) Teeth Intact   Pulmonary asthma (severe persistent) , former smoker,    Pulmonary exam normal breath sounds clear to auscultation       Cardiovascular negative cardio ROS Normal cardiovascular exam Rhythm:Regular Rate:Normal     Neuro/Psych negative neurological ROS  negative psych ROS   GI/Hepatic Neg liver ROS, GERD  ,  Endo/Other  negative endocrine ROS  Renal/GU negative Renal ROS  negative genitourinary   Musculoskeletal negative musculoskeletal ROS (+)   Abdominal   Peds  Hematology negative hematology ROS (+)   Anesthesia Other Findings Pt recently took course of prednisone- unclear what the exact indication was but seems most likely to be asthma exacerbation as he also had a course in January for this. Today he has no difficulty breathing and lungs are clear to auscultation with no wheezes.  Reproductive/Obstetrics                             Anesthesia Physical Anesthesia Plan  ASA: III  Anesthesia Plan: General   Post-op Pain Management:    Induction: Intravenous  PONV Risk Score and Plan: 2 and Ondansetron, Dexamethasone and Treatment may vary due to age or medical condition  Airway Management Planned: LMA  Additional Equipment: None  Intra-op Plan:   Post-operative Plan: Extubation in OR  Informed Consent: I have reviewed the patients History and Physical, chart, labs and discussed the procedure including the risks, benefits and alternatives for the proposed anesthesia with the patient or authorized  representative who has indicated his/her understanding and acceptance.     Dental advisory given  Plan Discussed with:   Anesthesia Plan Comments:         Anesthesia Quick Evaluation

## 2018-03-22 NOTE — Transfer of Care (Signed)
Immediate Anesthesia Transfer of Care Note  Patient: Alexander MORERA Sr.  Procedure(s) Performed: Carolynn Serve EXPLORATION RIGHT  PARTIAL EPIDIDYMECTOMY (Right Scrotum)  Patient Location: PACU  Anesthesia Type:General  Level of Consciousness: sedated  Airway & Oxygen Therapy: Patient Spontanous Breathing and Patient connected to nasal cannula oxygen  Post-op Assessment: Report given to RN  Post vital signs: Reviewed and stable  Last Vitals:  Vitals Value Taken Time  BP 116/76 03/22/2018  3:46 PM  Temp    Pulse 75 03/22/2018  3:47 PM  Resp 9 03/22/2018  3:47 PM  SpO2 100 % 03/22/2018  3:47 PM  Vitals shown include unvalidated device data.  Last Pain:  Vitals:   03/22/18 1230  TempSrc:   PainSc: 0-No pain      Patients Stated Pain Goal: 4 (05/69/79 4801)  Complications: No apparent anesthesia complications

## 2018-03-22 NOTE — H&P (Signed)
CC/HPI: CC: Testicular mass/cyst, epididymal mass   HPI:   12/15/2017:  Patient underwent a scrotal ultrasound on 09/07/2017 for a possible right epididymal and spermatic cord mass. He has also been experiencing some right-sided epididymal pain and noticed a mass there. He has had a vasectomy many years ago. He continues to have intermittent right-sided testicular pain. Scrotal ultrasound revealed a cyst within the testicle with a calcification. There was also a large dilated structure consistent with possible varicocele on the right. There are also some small epididymal cysts.   02/18/2018  Patient underwent a scrotal ultrasound which revealed dilated tubular structures consistent with possible varicoceles. CT scan confirmed this. There was no change in size with Valsalva. CT scan of the abdomen and pelvis revealed no lymphadenopathy or masses. He continues to have significant pain especially on the right.     ALLERGIES: penicillin    MEDICATIONS: Ventolin Hfa     GU PSH: Locm 300-399Mg /Ml Iodine,1Ml - 12/29/2017    NON-GU PSH: No Non-GU PSH    GU PMH: Epididymal cyst - 12/15/2017 Right testicular pain - 12/15/2017 Varicocele - 12/15/2017    NON-GU PMH: Asthma Hypercholesterolemia    FAMILY HISTORY: No Family History    SOCIAL HISTORY: Marital Status: Single Preferred Language: English; Race: Black or African [name] Current Smoking Status: Patient smokes occasionally.   Tobacco Use Assessment Completed: Used Tobacco in last 30 days? Drinks 3 drinks per day.      Notes: 1 son, 1 daughter    REVIEW OF SYSTEMS:    GU Review Male:   Patient denies get up at night to urinate, erection problems, hard to postpone urination, have to strain to urinate , frequent urination, leakage of urine, burning/ pain with urination, penile pain, trouble starting your stream, and stream starts and stops.  Gastrointestinal (Upper):   Patient denies nausea, vomiting, and indigestion/ heartburn.   Gastrointestinal (Lower):   Patient denies diarrhea and constipation.  Constitutional:   Patient denies fever, night sweats, weight loss, and fatigue.  Skin:   Patient denies skin rash/ lesion and itching.  Eyes:   Patient reports blurred vision. Patient denies double vision.  Ears/ Nose/ Throat:   Patient reports sinus problems. Patient denies sore throat.  Hematologic/Lymphatic:   Patient denies swollen glands and easy bruising.  Cardiovascular:   Patient denies leg swelling and chest pains.  Respiratory:   Patient reports cough and shortness of breath.   Endocrine:   Patient denies excessive thirst.  Musculoskeletal:   Patient reports back pain and joint pain.   Neurological:   Patient denies headaches and dizziness.  Psychologic:   Patient denies depression and anxiety.   VITAL SIGNS:      02/18/2018 01:19 PM  BP 127/85 mmHg  Pulse 88 /min  Temperature 97.5 F / 36.3 C   GU PHYSICAL EXAMINATION:    Testes: Left testicle normal. Right testicle normal as well but the right epididymis is quite firm and nodular and the nodularity extends up the spermatic cord.  Penis: Circumcised, no warts, no cracks. No dorsal Peyronie's plaques, no left corporal Peyronie's plaques, no right corporal Peyronie's plaques, no scarring, no warts. No balanitis, no meatal stenosis.   MULTI-SYSTEM PHYSICAL EXAMINATION:    Constitutional: Well-nourished. No physical deformities. Normally developed. Good grooming.  Respiratory: No labored breathing, no use of accessory muscles.   Cardiovascular: Normal temperature, adequate perfusion of extremities  Skin: No paleness, no jaundice  Neurologic / Psychiatric: Oriented to time, oriented to place, oriented to person.  No depression, no anxiety, no agitation.  Gastrointestinal: No mass, no tenderness, no rigidity, non obese abdomen.  Eyes: Normal conjunctivae. Normal eyelids.  Musculoskeletal: Normal gait and station of head and neck.     PAST DATA REVIEWED:   Source Of History:  Patient  Records Review:   Previous Patient Records  X-Ray Review: Scrotal Ultrasound: Reviewed Films. Reviewed Report. Discussed With Patient.  C.T. Abdomen/Pelvis: Reviewed Films. Reviewed Report. Discussed With Patient.     PROCEDURES: None   ASSESSMENT:      ICD-10 Details  1 GU:   Varicocele - I86.1   2   Right testicular pain - N50.811   3   Epididymal cyst - N50.3    PLAN:           Document Letter(s):  Created for Patient: Clinical Summary         Notes:   Plan for right scrotal exploration and possible epididymectomy. Possible varicocelectomy. He understands potential risks including but not limited to bleeding, infection, injury to surrounding structures, need for additional procedures, possible testicular loss and need for orchiectomy.   Cc: Domenica Fail, PA    Signed by Link Snuffer, III, M.D. on 02/18/18 at 2:14 PM (EST

## 2018-03-22 NOTE — Anesthesia Procedure Notes (Signed)
Procedure Name: LMA Insertion Date/Time: 03/22/2018 2:36 PM Performed by: Bonney Aid, CRNA Pre-anesthesia Checklist: Patient identified, Emergency Drugs available, Suction available and Patient being monitored Patient Re-evaluated:Patient Re-evaluated prior to induction Oxygen Delivery Method: Circle system utilized Preoxygenation: Pre-oxygenation with 100% oxygen Induction Type: IV induction Ventilation: Mask ventilation without difficulty LMA: LMA inserted LMA Size: 5.0 Number of attempts: 1 Airway Equipment and Method: Bite block Placement Confirmation: positive ETCO2 Tube secured with: Tape Dental Injury: Teeth and Oropharynx as per pre-operative assessment

## 2018-03-22 NOTE — Discharge Instructions (Addendum)
Discharge instructions following scrotal surgery  Call your doctor for:  Fever is greater than 100.5  Severe nausea or vomiting  Increasing pain not controlled by pain medication  Increasing redness or drainage from incisions  The number for questions or concerns is [phone]  Activity level: No lifting greater than 20 pounds (about equal to milk) for the next 2 weeks or until cleared to do so at follow-up appointment.  Otherwise activity as tolerated by comfort level.  Diet: May resume your regular diet as tolerated  Driving: No driving while still taking opiate pain medications (weight at least 6-8 hours after last dose).  No driving if you still sore from surgery as it may limit her ability to react quickly if necessary.   Shower/bath: May shower and get incision wet pad dry immediately following.  Do not scrub vigorously for the next 2-3 weeks.  Do not soak incision (ID soaking in bath or swimming) until told he may do so by Dr., as this may promote a wound infection.  Wound care: He may cover wounds with sterile gauze as needed to prevent incisions rubbing on close follow-up in any seepage.  Where tight fitting underpants/scrotal support for at least 2 weeks.  He should apply cold compresses (ice or sac of frozen peas/corn) to your scrotum for at least 48 hours to reduce the swelling for 15 minutes at a time indirectly.  You should expect that his scrotum will swell up initially and then get smaller over the next 2-4 weeks.  Follow-up appointments: Follow-up appointment will be scheduled with Dr. Gloriann Loan for a wound check.    Post Anesthesia Home Care Instructions  Activity: Get plenty of rest for the remainder of the day. A responsible individual must stay with you for 24 hours following the procedure.  For the next 24 hours, DO NOT: -Drive a car -Paediatric nurse -Drink alcoholic beverages -Take any medication unless instructed by your physician -Make any legal  decisions or sign important papers.  Meals: Start with liquid foods such as gelatin or soup. Progress to regular foods as tolerated. Avoid greasy, spicy, heavy foods. If nausea and/or vomiting occur, drink only clear liquids until the nausea and/or vomiting subsides. Call your physician if vomiting continues.  Special Instructions/Symptoms: Your throat may feel dry or sore from the anesthesia or the breathing tube placed in your throat during surgery. If this causes discomfort, gargle with warm salt water. The discomfort should disappear within 24 hours.  If you had a scopolamine patch placed behind your ear for the management of post- operative nausea and/or vomiting:  1. The medication in the patch is effective for 72 hours, after which it should be removed.  Wrap patch in a tissue and discard in the trash. Wash hands thoroughly with soap and water. 2. You may remove the patch earlier than 72 hours if you experience unpleasant side effects which may include dry mouth, dizziness or visual disturbances. 3. Avoid touching the patch. Wash your hands with soap and water after contact with the patch.     Ibuprofen may be taken after 9:30 PM if needed

## 2018-03-22 NOTE — Anesthesia Postprocedure Evaluation (Signed)
Anesthesia Post Note  Patient: Alexander KRUTZ Sr.  Procedure(s) Performed: SCROTAL EXPLORATION RIGHT  PARTIAL EPIDIDYMECTOMY (Right Scrotum)     Patient location during evaluation: PACU Anesthesia Type: General Level of consciousness: awake and alert Pain management: pain level controlled Vital Signs Assessment: post-procedure vital signs reviewed and stable Respiratory status: spontaneous breathing, nonlabored ventilation and respiratory function stable Cardiovascular status: blood pressure returned to baseline and stable Postop Assessment: no apparent nausea or vomiting Anesthetic complications: no    Last Vitals:  Vitals:   03/22/18 1628 03/22/18 1630  BP:  121/84  Pulse: 82 77  Resp: 16 13  Temp:    SpO2: 100% 99%    Last Pain:  Vitals:   03/22/18 1615  TempSrc:   PainSc: 0-No pain                 Lidia Collum

## 2018-03-22 NOTE — Interval H&P Note (Signed)
History and Physical Interval Note:  03/22/2018 1:47 PM  Pete Pelt Sr.  has presented today for surgery, with the diagnosis of RIGHT EPIDIDYMAL CYST, RIGHT VARICOCELE.  The various methods of treatment have been discussed with the patient and family. After consideration of risks, benefits and other options for treatment, the patient has consented to  Procedure(s): SCROTAL EXPLORATION RIGHT EPIDIDYMECTOMY POSSIBLE VARICOCELECTOMY (Right) as a surgical intervention.  The patient's history has been reviewed, patient examined, no change in status, stable for surgery.  I have reviewed the patient's chart and labs.  Questions were answered to the patient's satisfaction.     Marton Redwood, III

## 2018-03-22 NOTE — Op Note (Signed)
Operative Note  Preoperative diagnosis:  1.  Peritesticular mass versus possible varicocele  Postoperative diagnosis: 1.  Peritesticular mass consistent with possible large sperm granuloma versus epididymal cyst  Procedure(s): 1.  Partial epididymectomy/epididymal cyst removal  Surgeon: Link Snuffer, MD  Assistants: Basilio Cairo, MD, resident  Anesthesia: General  Complications: None  EBL: 20 cc  Specimens: 1.  Epididymal cyst, portion of vas deferens  Drains/Catheters: 1.  None  Intraoperative findings: There was an approximately 3 to 4 cm firm cystic mass that appeared to originate close to the distal vas deferens.  The vas deferens terminated into the cystic mass.  Remainder of epididymis was normal except for a small epididymal cyst.  Clear fluid returned from the vas deferens when it was divided.  The vas deferens was removed up to the level of surgical clips.  Indication: 53 year old male with a history of vasectomy with severe intermittent right-sided testicular pain.  He underwent a scrotal ultrasound that revealed a cyst within the testicle as well as large dilated structure consistent with possible varicocele on the right.  He also had some epididymal cyst.  He had a CT scan which revealed findings consistent with possible varicocele as well.  His examination however was not really consistent with a varicocele and more consistent with a epididymal cyst.  Therefore, he presents for scrotal exploration with possible epididymectomy versus epididymal cyst removal versus possible varicocelectomy.  Description of procedure:  The patient was identified and consent was obtained.  The patient was taken to the operating room and placed in the supine position.  The patient was placed under general anesthesia.  Perioperative antibiotics were administered.  Patient was prepped and draped in a standard sterile fashion and a timeout was performed.  An approximately 4 cm incision was  made along the median raphae of the scrotum.  This incision was carried down through the dartos and the testicle was delivered onto the operative field.  Gubernacular attachments were released.  The testicle and spermatic cord were inspected.  This revealed surgical clips on the vas deferens and the vas deferens terminated into a large firm cystic structure.  Therefore decision was made to perform excision of the vas deferens up to the level of the surgical clips and to excise the epididymal mass.  The mass was carefully dissected away from the testicle as well as the remainder of the epididymis.  The vas deferens was excised contiguous with the mass.  When the vas deferens was divided, clear thin fluid discharged from the vas deferens.  I decided to excise an additional portion of the vas deferens up to the proximal portion just above the surgical clips.  The end of the vas deferens was fulgurated.  Hemostasis was obtained with Bovie electrocautery.  I used Doppler ultrasound to confirm good flow to the testicle and this confirmed good flow proximally and distally.  The scrotum was irrigated and any additional bleeding was controlled with hemostasis.  The testicle was delivered back into the scrotum in its proper anatomical position.  The dartos was then closed with 3-0 Vicryl in a running fashion followed by closure of the skin with a running 4-0 Monocryl.  Quarter percent Marcaine was instilled for anesthetic effect and Dermabond was applied.  This concluded the operation.  The patient tolerated the procedure well and was stable postoperatively.  Plan: Follow-up in 4 to 6 weeks.

## 2018-03-23 ENCOUNTER — Encounter (HOSPITAL_BASED_OUTPATIENT_CLINIC_OR_DEPARTMENT_OTHER): Payer: Self-pay | Admitting: Urology

## 2018-03-26 MED FILL — VENTOLIN HFA 90 MCG INHALER: 108 (90 BAS | 16 days supply | Qty: 18 | Fill #4

## 2018-04-20 DIAGNOSIS — N503 Cyst of epididymis: Secondary | ICD-10-CM | POA: Diagnosis not present

## 2018-04-27 ENCOUNTER — Ambulatory Visit: Payer: Medicare Other | Admitting: Primary Care

## 2018-04-27 ENCOUNTER — Ambulatory Visit (INDEPENDENT_AMBULATORY_CARE_PROVIDER_SITE_OTHER): Payer: Medicare Other | Admitting: Primary Care

## 2018-04-27 ENCOUNTER — Telehealth: Payer: Self-pay | Admitting: *Deleted

## 2018-04-27 NOTE — Telephone Encounter (Signed)
Patient verified DOB Patient states he followed up with urology on 04/20/2018 and completed a UA and had the site checked. Patient states all results were normal and incision site was fine. Patient elected to rescheduled the FU with PCP today. Patient opted out of the tele-visit option and states he will return a phone call to reschedule at a later time.

## 2018-05-05 MED FILL — VENTOLIN HFA 90 MCG INHALER: 108 (90 BAS | 16 days supply | Qty: 18 | Fill #5

## 2018-06-15 MED FILL — ALBUTEROL SULFATE HFA 108 (: 108 (90 BAS | 16 days supply | Qty: 18 | Fill #6

## 2018-07-07 IMAGING — US US SCROTUM W/ DOPPLER COMPLETE
1 series · 13 of 25 positions shown · non-contrast
Comparison: None

CLINICAL DATA: RIGHT epididymal and spermatic cord mass, epididymal
pain, has had a bump on the RIGHT for 4 months; post vasectomy about
25 years ago

EXAM:
SCROTAL ULTRASOUND
DOPPLER ULTRASOUND OF THE TESTICLES
TECHNIQUE: Complete ultrasound examination of the testicles, epididymis, and
other scrotal structures was performed. Color and spectral Doppler
ultrasound were also utilized to evaluate blood flow to the
testicles.

[Series 1: us scrotum w/ doppler complete · 0.07mm/px · 73 acquisitions, 13 frames shown]
[im 1/73]
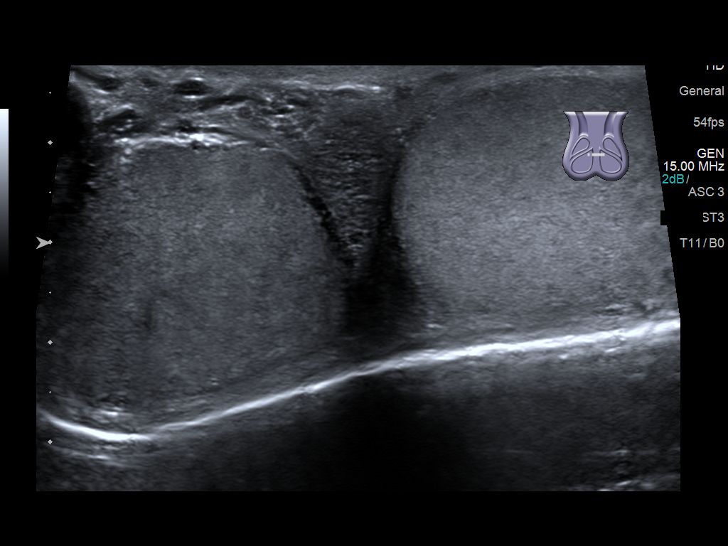
[im 7/73]
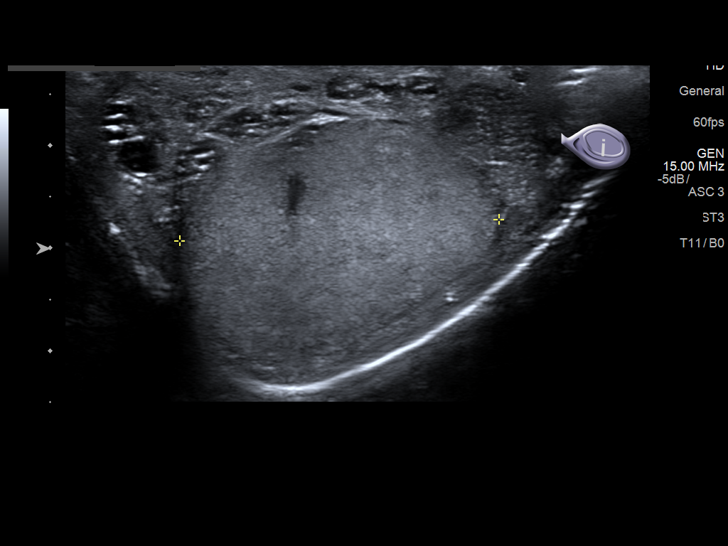
[im 13/73]
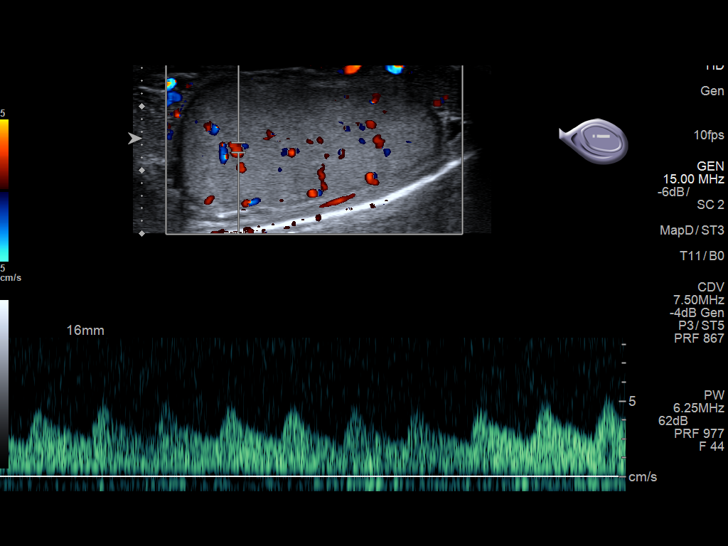
[im 19/73]
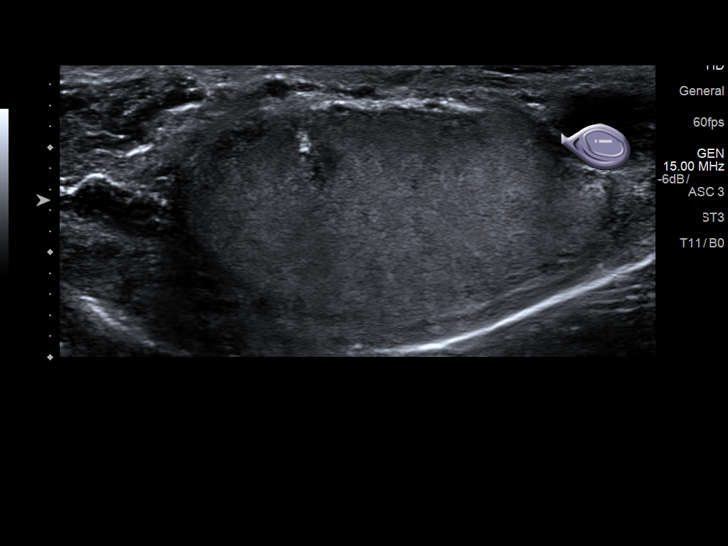
[im 25/73]
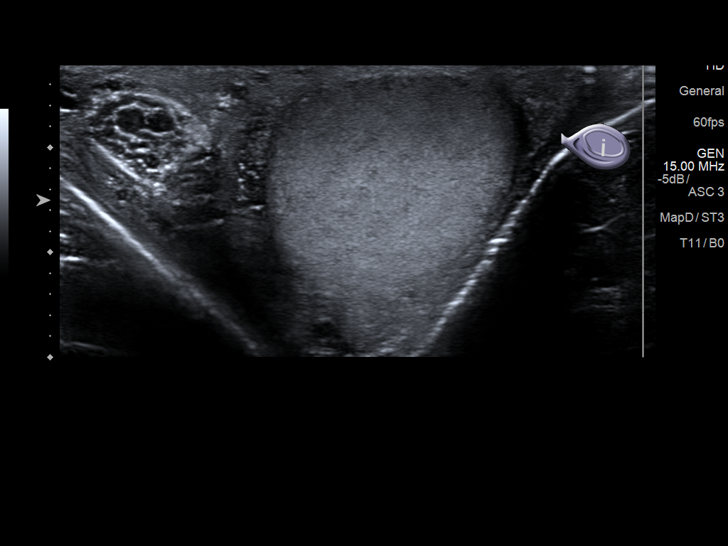
[im 31/73]
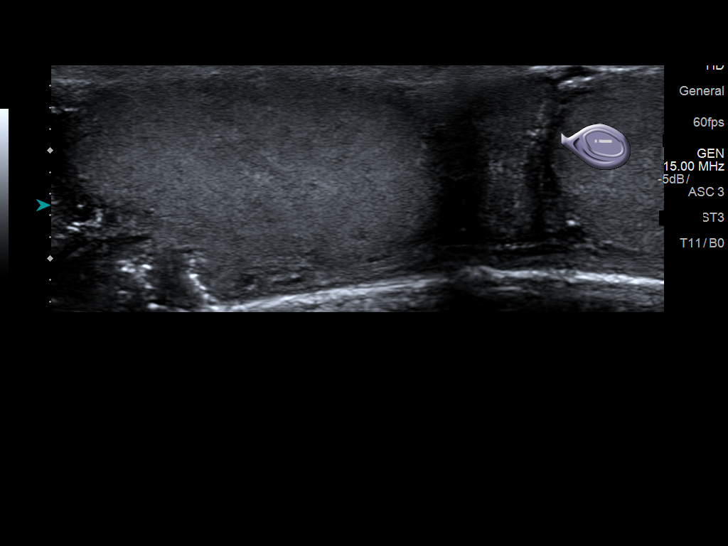
[im 37/73]
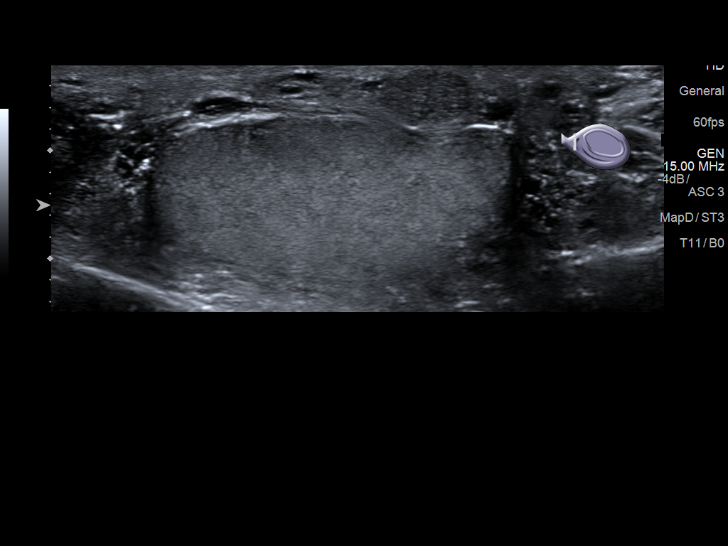
[im 43/73]
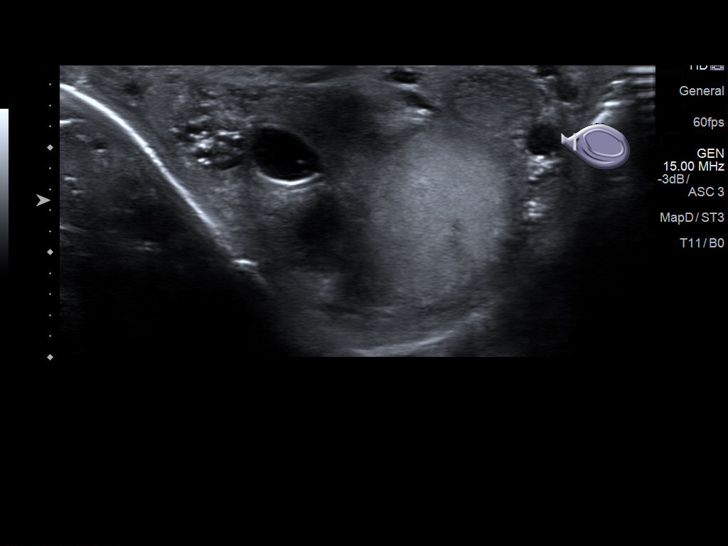
[im 49/73]
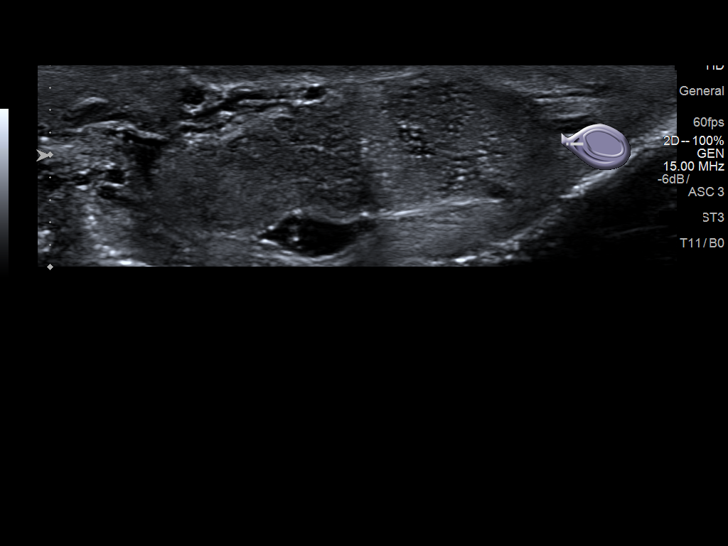
[im 55/73]
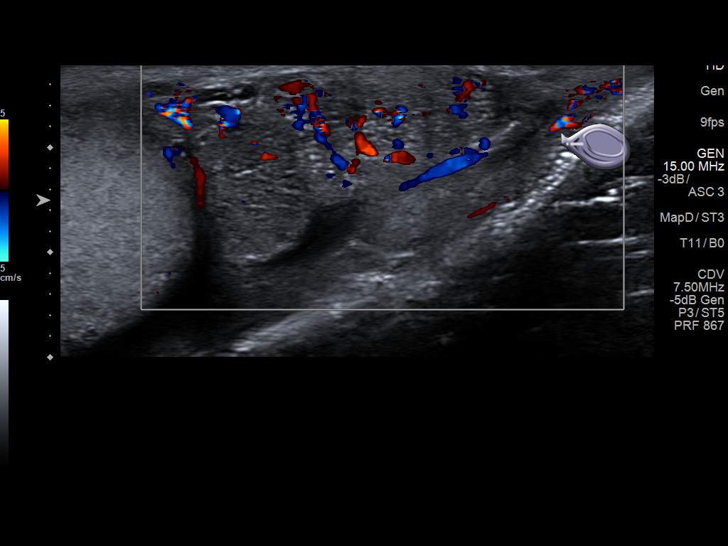
[im 61/73]
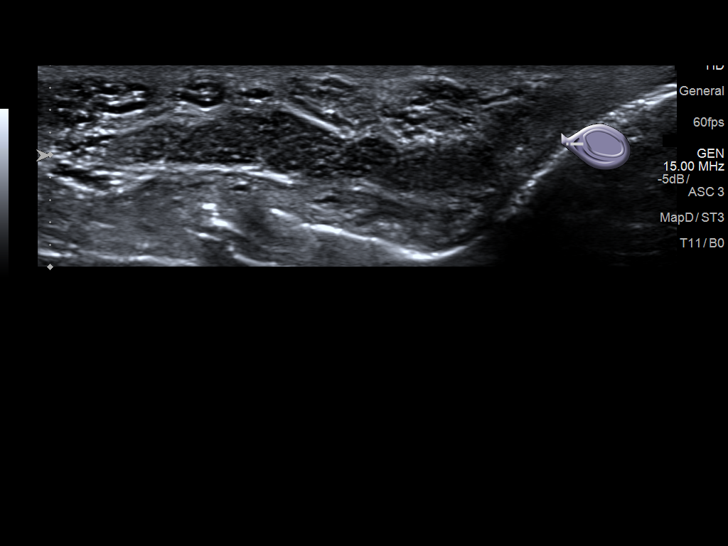
[im 67/73]
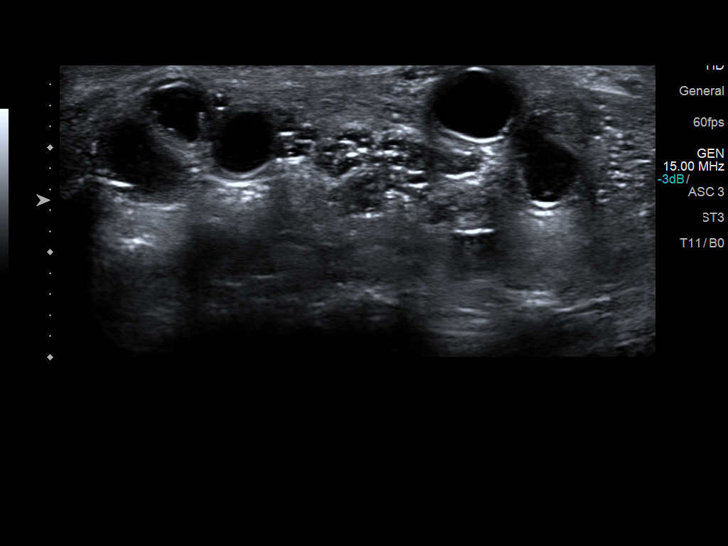
[im 73/73]
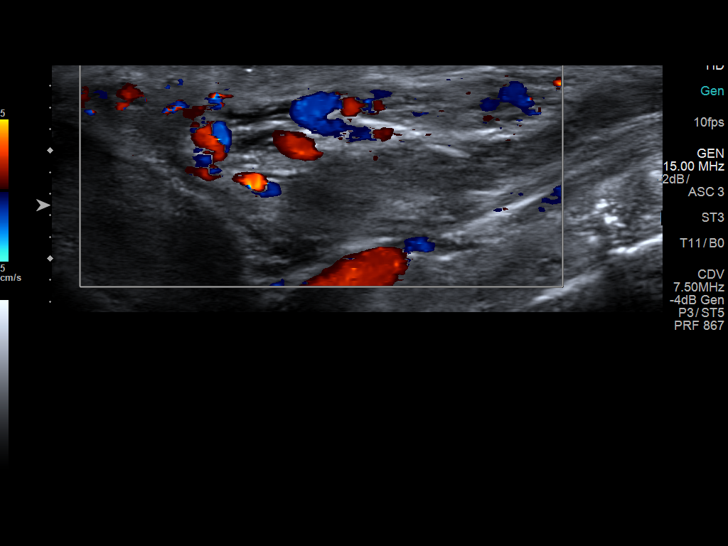

[13 of 25 positions shown; findings below may reference images not displayed]

FINDINGS: Right testicle

Measurements: 4.3 x 2.2 x 3.1 cm. Single small calcification. Small
hypoechoic focus adjacent to a vessel adjacent to the calcification,
overall 6 mm greatest diameter. Favor this representing an area of
parenchymal scarring. No additional mass lesion. Internal blood flow
present on color Doppler imaging.

Left testicle

Measurements: 4.3 x 1.8 x 2.9 cm. Normal morphology without mass.
Internal blood flow present on color Doppler imaging.

Right epididymis: Tubular ectasia of RIGHT epididymis. Cyst at
epididymal head 9 x 7 x 6 mm. RIGHT epididymis appears enlarged and
hypervascular versus LEFT

Left epididymis: Tubular ectasia of the LEFT epididymis. No discrete
mass.

Hydrocele:  No significant hydroceles.

Varicocele: Large dilated venous structure in the RIGHT spermatic
cord adjacent to the RIGHT epididymis measuring up to 10 mm
diameter, demonstrating internal blood flow on color Doppler
imaging, consistent with varicocele/varix.

Pulsed Doppler interrogation of both testes demonstrates normal low
resistance arterial and venous waveforms bilaterally.
IMPRESSION: Normal appearing LEFT testis.

Small calcification and suspect scar within RIGHT testis, 6 mm
greatest diameter; recommend follow-up ultrasound in 6 months to
demonstrate stability and exclude mass.

Tubular ectasia of the epididymi likely related to remote vasectomy.

RIGHT varicocele/varix; RIGHT unilateral varicocele is much less
common than LEFT and is often associated with retroperitoneal
abnormalities including adenopathy and tumor as well as renal venous
thrombosis and increased renal vein pressures; follow-up CT imaging
the abdomen and pelvis with IV and oral contrast recommended to
exclude secondary causes of varicocele.

## 2018-07-07 MED FILL — ALBUTEROL SULFATE HFA 108 (: 108 (90 BAS | 16 days supply | Qty: 18 | Fill #7

## 2018-08-06 MED FILL — ALBUTEROL SULFATE HFA 108 (: 108 (90 BAS | 16 days supply | Qty: 18 | Fill #8

## 2018-08-06 MED FILL — BREO ELLIPTA 200-25 MCG INH: 200-25 | 30 days supply | Qty: 60 | Fill #1

## 2018-10-11 ENCOUNTER — Encounter (INDEPENDENT_AMBULATORY_CARE_PROVIDER_SITE_OTHER): Payer: Self-pay | Admitting: Primary Care

## 2018-10-11 ENCOUNTER — Other Ambulatory Visit: Payer: Self-pay

## 2018-10-11 ENCOUNTER — Ambulatory Visit (INDEPENDENT_AMBULATORY_CARE_PROVIDER_SITE_OTHER): Payer: Medicare Other | Admitting: Primary Care

## 2018-10-11 VITALS — BP 120/82 | HR 93 | Temp 97.3°F | Ht 70.0 in | Wt 182.0 lb

## 2018-10-11 DIAGNOSIS — J4551 Severe persistent asthma with (acute) exacerbation: Secondary | ICD-10-CM

## 2018-10-11 DIAGNOSIS — H913 Deaf nonspeaking, not elsewhere classified: Secondary | ICD-10-CM

## 2018-10-11 DIAGNOSIS — Z0000 Encounter for general adult medical examination without abnormal findings: Secondary | ICD-10-CM

## 2018-10-11 DIAGNOSIS — R062 Wheezing: Secondary | ICD-10-CM

## 2018-10-11 DIAGNOSIS — Z0289 Encounter for other administrative examinations: Secondary | ICD-10-CM | POA: Diagnosis not present

## 2018-10-11 DIAGNOSIS — Z760 Encounter for issue of repeat prescription: Secondary | ICD-10-CM

## 2018-10-11 DIAGNOSIS — R0602 Shortness of breath: Secondary | ICD-10-CM | POA: Diagnosis not present

## 2018-10-11 MED ORDER — ALBUTEROL SULFATE HFA 108 (90 BASE) MCG/ACT IN AERS: 2.0000 | INHALATION_SPRAY | RESPIRATORY_TRACT | 1 refills | Status: DC | PRN

## 2018-10-11 MED ORDER — MONTELUKAST SODIUM 10 MG PO TABS: 10.0000 mg | ORAL_TABLET | Freq: Every day | ORAL | 11 refills | Status: DC

## 2018-10-11 MED ORDER — BREO ELLIPTA 200-25 MCG/INH IN AEPB: 1.0000 | INHALATION_SPRAY | Freq: Every day | RESPIRATORY_TRACT | 11 refills | Status: DC

## 2018-10-11 MED ORDER — LORATADINE 10 MG PO TABS: 10.0000 mg | ORAL_TABLET | Freq: Every day | ORAL | 11 refills | Status: DC

## 2018-10-11 MED FILL — BREO ELLIPTA 200-25 MCG INH: 200-25 | 30 days supply | Qty: 60 | Fill #0

## 2018-10-11 MED FILL — VENTOLIN HFA 90 MCG INHALER: 108 (90 BAS | 18 days supply | Qty: 18 | Fill #0

## 2018-10-11 MED FILL — MONTELUKAST SOD 10 MG TAB: 10 | 30 days supply | Qty: 30 | Fill #0

## 2018-10-11 NOTE — Progress Notes (Signed)
Established Patient Office Visit  Subjective:  Patient ID: Alexander Reeves., male    DOB: 08-13-1965  Age: 53 y.o. MRN: [id number]  CC:  Chief Complaint  Patient presents with  . Annual Exam    medication refills    HPI [name] 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 Reviewing his medical records he has seen Dr. Joya Gaskins pulmonologist and will proceed with his recommendations. 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 Interrupter used for sign language.  Past Medical History:  Diagnosis Date  . Arthritis    KNEES  . Asthma   . Deaf   . GERD (gastroesophageal reflux disease)    OCC  . Scrotal cyst   . Thyroid disease   . Urinary frequency    OCC    Past Surgical History:  Procedure Laterality Date  . BLADDER SURGERY  YRS AGO  . EPIDIDYMECTOMY Right 03/22/2018   Procedure: SCROTAL EXPLORATION RIGHT  PARTIAL EPIDIDYMECTOMY;  Surgeon: [name], MD;  Location: [facility];  Service: Urology;  Laterality: Right;  . HEMORRHOID SURGERY  WHEN YOUNG  . NO PAST SURGERIES    . VASECTOMY  YRS AGO    Family History  Problem Relation Age of Onset  . Heart disease Father     Social History   Socioeconomic History  . Marital status: Single    Spouse name: Not on file  . Number of children: Not on file  . Years of education: Not on file  . Highest education level: Not on file  Occupational History  . Not on file  Social Needs  . Financial resource strain: Not on file  . Food insecurity    Worry: Not on file    Inability: Not on file  . Transportation needs    Medical: Not on file    Non-medical: Not on file  Tobacco Use  . Smoking status: Former Smoker    Types: Cigarettes    Quit date: 06/24/2016    Years since quitting: 2.2  . Smokeless tobacco: Never Used  Substance and Sexual Activity  . Alcohol use: Yes    Comment: OCC  . Drug  use: Yes    Types: Marijuana    Comment: 3 MONTHS AGO  . Sexual activity: Never    Birth control/protection: None  Lifestyle  . Physical activity    Days per week: Not on file    Minutes per session: Not on file  . Stress: Not on file  Relationships  . Social Herbalist on phone: Not on file    Gets together: Not on file    Attends religious service: Not on file    Active member of club or organization: Not on file    Attends meetings of clubs or organizations: Not on file    Relationship status: Not on file  . Intimate partner violence    Fear of current or ex partner: Not on file    Emotionally abused: Not on file    Physically abused: Not on file    Forced sexual activity: Not on file  Other Topics Concern  . Not on file  Social History Narrative   [facility] (09/01/16):   No new pets. Moved from [place]. Questionable mold exposure at his previous residence and possibly some at his new apartment as well.  Outpatient Medications Prior to Visit  Medication Sig Dispense Refill  . albuterol (PROVENTIL) (2.5 MG/3ML) 0.083% nebulizer solution Take 3 mLs (2.5 mg total) by nebulization every 6 (six) hours as needed for wheezing or shortness of breath. 75 mL 12  . albuterol (PROVENTIL HFA;VENTOLIN HFA) 108 (90 Base) MCG/ACT inhaler Inhale 2 puffs into the lungs every 4 (four) hours as needed for wheezing or shortness of breath. 1 Inhaler 11  . fluticasone furoate-vilanterol (BREO ELLIPTA) 200-25 MCG/INH AEPB Inhale 1 puff into the lungs daily. 60 each 11  . loratadine (CLARITIN) 10 MG tablet Take 1 tablet (10 mg total) by mouth daily. 30 tablet 11  . montelukast (SINGULAIR) 10 MG tablet Take 1 tablet (10 mg total) by mouth at bedtime. 30 tablet 11  . HYDROcodone-acetaminophen (NORCO) 5-325 MG tablet Take 1 tablet by mouth every 4 (four) hours as needed for moderate pain. 10 tablet 0  . predniSONE (DELTASONE) 10 MG tablet Take 4 tablets daily for 5 days then stop 20  tablet 0   No facility-administered medications prior to visit.     Allergies  Allergen Reactions  . Cheese Shortness Of Breath and Swelling    Swelling of the throat  . Eggs Or Egg-Derived Products Shortness Of Breath and Swelling    Swelling of the throat  . Penicillins Anaphylaxis    Has patient had a PCN reaction causing immediate rash, facial/tongue/throat swelling, SOB or lightheadedness with hypotension: Yes Has patient had a PCN reaction causing severe rash involving mucus membranes or skin necrosis: Unknown Has patient had a PCN reaction that required hospitalization: Unknown Has patient had a PCN reaction occurring within the last 10 years: Unknown If all of the above answers are "NO", then may proceed with Cephalosporin use.     ROS Review of Systems  Respiratory: Positive for shortness of breath and wheezing.       Objective:    BP 120/82 (BP Location: Right Arm, Patient Position: Sitting, Cuff Size: Normal)   Pulse 93   Temp (!) 97.3 F (36.3 C) (Tympanic)   Ht 5\' 10"  (1.778 m)   Wt 182 lb (82.6 kg)   SpO2 94%   BMI 26.11 kg/m  Wt Readings from Last 3 Encounters:  10/11/18 182 lb (82.6 kg)  03/22/18 182 lb 8 oz (82.8 kg)  01/28/18 180 lb 9.6 oz (81.9 kg)   General Appearance:    Alert, cooperative, no distress, appears stated age  Head:    Normocephalic, without obvious abnormality, atraumatic  Eyes:    PERRL, conjunctiva/corneas clear, EOM's intact, fundi    benign, both eyes       Ears:    Normal TM's and external ear canals, both ears  Nose:   No  sinus tenderness  Throat: Deferred   Neck:   Supple, symmetrical, trachea midline, no adenopathy;       thyroid:  No enlargement/tenderness/nodules; no carotid   bruit or JVD  Back:     Symmetric, no curvature, ROM normal, no CVA tenderness  Lungs:     Clear to auscultation bilaterally, respirations unlabored  Chest wall:    No tenderness or deformity  Heart:    Regular rate and rhythm, S1 and S2  normal, no murmur, rub   or gallop  Abdomen:     Soft, non-tender, bowel sounds active all four quadrants,    no masses, no organomegaly  Genitalia:  Deferred   Rectal:  Deferred  Extremities:   Extremities normal, atraumatic, no cyanosis  or edema  Pulses:   2+ and symmetric all extremities  Skin:   Skin color, texture, turgor normal, no rashes or lesions  Lymph nodes:   Cervical, supraclavicular, and axillary nodes normal  Neurologic:   CNII -VII and  IX- XII intact. Normal strength, sensation and reflexes throughout     Health Maintenance Due  Topic Date Due  . COLONOSCOPY  02/05/2017    There are no preventive care reminders to display for this patient.  Lab Results  Component Value Date   TSH 0.702 09/01/2016   Lab Results  Component Value Date   WBC 9.2 12/31/2017   HGB 13.2 12/31/2017   HCT 41.7 12/31/2017   MCV 84.6 12/31/2017   PLT 198 12/31/2017   Lab Results  Component Value Date   NA 136 12/31/2017   K 4.1 12/31/2017   CO2 25 12/31/2017   GLUCOSE 105 (H) 12/31/2017   BUN 14 12/31/2017   CREATININE 1.19 12/31/2017   BILITOT 0.7 06/14/2016   ALKPHOS 44 06/14/2016   AST 26 06/14/2016   ALT 30 06/14/2016   PROT 6.9 06/14/2016   ALBUMIN 3.9 06/14/2016   CALCIUM 8.8 (L) 12/31/2017   ANIONGAP 8 12/31/2017    Assessment & Plan:   Problem List Items Addressed This Visit    Chronic asthma, severe persistent, with acute exacerbation   Relevant Medications   fluticasone furoate-vilanterol (BREO ELLIPTA) 200-25 MCG/INH AEPB   montelukast (SINGULAIR) 10 MG tablet   albuterol (VENTOLIN HFA) 108 (90 Base) MCG/ACT inhaler   Other Relevant Orders   CBC with Differential   Comprehensive metabolic panel    Other Visit Diagnoses    Annual physical exam    -  Primary   Relevant Orders   CBC with Differential   Comprehensive metabolic panel   Lipid Panel   Medication refill       Relevant Medications   fluticasone furoate-vilanterol (BREO ELLIPTA) 200-25  MCG/INH AEPB   loratadine (CLARITIN) 10 MG tablet   montelukast (SINGULAIR) 10 MG tablet   albuterol (VENTOLIN HFA) 108 (90 Base) MCG/ACT inhaler   Nonspeaking deaf        Alexander Reeves was seen today for annual exam.  Diagnoses and all orders for this visit:  Annual physical exam BP 120/82 (BP Location: Right Arm, Patient Position: Sitting, Cuff Size: Normal)   Pulse 93   Temp (!) 97.3 F (36.3 C) (Tympanic)   Ht 5\' 10"  (1.778 m)   Wt 182 lb (82.6 kg)   SpO2 94%   BMI 26.11 kg/m   -     CBC with Differential -     Comprehensive metabolic panel -     Lipid Panel  Medication refill -     fluticasone furoate-vilanterol (BREO ELLIPTA) 200-25 MCG/INH AEPB; Inhale 1 puff into the lungs daily. -     loratadine (CLARITIN) 10 MG tablet; Take 1 tablet (10 mg total) by mouth daily. -     montelukast (SINGULAIR) 10 MG tablet; Take 1 tablet (10 mg total) by mouth at bedtime. -     albuterol (VENTOLIN HFA) 108 (90 Base) MCG/ACT inhaler; Inhale 2 puffs into the lungs every 4 (four) hours as needed for wheezing or shortness of breath.  Chronic asthma, severe persistent, with acute exacerbation Asthma is recognize by classic presentation of symptoms which include cough, wheeze, and shortness of breath brought on by characteristic triggers and relieved by bronchodilating medications.  This is caused by from hyper responsiveness with tendency of airways to  narrow excessively in response to a stimuli. -     CBC with Differential -     Comprehensive metabolic panel  Nonspeaking deaf Acquired uses sign language developed deafness after he had the mumps as a child.     Meds ordered this encounter  Medications  . fluticasone furoate-vilanterol (BREO ELLIPTA) 200-25 MCG/INH AEPB    Sig: Inhale 1 puff into the lungs daily.    Dispense:  60 each    Refill:  11  . loratadine (CLARITIN) 10 MG tablet    Sig: Take 1 tablet (10 mg total) by mouth daily.    Dispense:  30 tablet    Refill:  11  .  montelukast (SINGULAIR) 10 MG tablet    Sig: Take 1 tablet (10 mg total) by mouth at bedtime.    Dispense:  30 tablet    Refill:  11  . albuterol (VENTOLIN HFA) 108 (90 Base) MCG/ACT inhaler    Sig: Inhale 2 puffs into the lungs every 4 (four) hours as needed for wheezing or shortness of breath.    Dispense:  6.7 g    Refill:  1    Follow-up: Return if symptoms worsen or fail to improve.    [name], NP

## 2018-10-12 ENCOUNTER — Other Ambulatory Visit (INDEPENDENT_AMBULATORY_CARE_PROVIDER_SITE_OTHER): Payer: Self-pay | Admitting: Primary Care

## 2018-10-12 LAB — CBC WITH DIFFERENTIAL/PLATELET
Basophils Absolute: 0 10*3/uL (ref 0.0–0.2)
Basos: 1 %
EOS (ABSOLUTE): 0.2 10*3/uL (ref 0.0–0.4)
Eos: 5 %
Hematocrit: 41.4 % (ref 37.5–51.0)
Hemoglobin: 13.4 g/dL (ref 13.0–17.7)
Immature Grans (Abs): 0 10*3/uL (ref 0.0–0.1)
Immature Granulocytes: 0 %
Lymphocytes Absolute: 1.6 10*3/uL (ref 0.7–3.1)
Lymphs: 36 %
MCH: 26.3 pg — ABNORMAL LOW (ref 26.6–33.0)
MCHC: 32.4 g/dL (ref 31.5–35.7)
MCV: 81 fL (ref 79–97)
Monocytes Absolute: 0.4 10*3/uL (ref 0.1–0.9)
Monocytes: 9 %
Neutrophils Absolute: 2.3 10*3/uL (ref 1.4–7.0)
Neutrophils: 49 %
Platelets: 170 10*3/uL (ref 150–450)
RBC: 5.1 x10E6/uL (ref 4.14–5.80)
RDW: 14 % (ref 11.6–15.4)
WBC: 4.6 10*3/uL (ref 3.4–10.8)

## 2018-10-12 LAB — COMPREHENSIVE METABOLIC PANEL
ALT: 17 IU/L (ref 0–44)
AST: 14 IU/L (ref 0–40)
Albumin/Globulin Ratio: 1.6 (ref 1.2–2.2)
Albumin: 4.4 g/dL (ref 3.8–4.9)
Alkaline Phosphatase: 68 IU/L (ref 39–117)
BUN/Creatinine Ratio: 7 — ABNORMAL LOW (ref 9–20)
BUN: 9 mg/dL (ref 6–24)
Bilirubin Total: 0.2 mg/dL (ref 0.0–1.2)
CO2: 23 mmol/L (ref 20–29)
Calcium: 8.8 mg/dL (ref 8.7–10.2)
Chloride: 106 mmol/L (ref 96–106)
Creatinine, Ser: 1.24 mg/dL (ref 0.76–1.27)
GFR calc Af Amer: 77 mL/min/{1.73_m2} (ref >59)
GFR calc non Af Amer: 67 mL/min/{1.73_m2} (ref >59)
Globulin, Total: 2.7 g/dL (ref 1.5–4.5)
Glucose: 105 mg/dL — ABNORMAL HIGH (ref 65–99)
Potassium: 4.6 mmol/L (ref 3.5–5.2)
Sodium: 141 mmol/L (ref 134–144)
Total Protein: 7.1 g/dL (ref 6.0–8.5)

## 2018-10-12 LAB — LIPID PANEL
Chol/HDL Ratio: 5.9 ratio — ABNORMAL HIGH (ref 0.0–5.0)
Cholesterol, Total: 223 mg/dL — ABNORMAL HIGH (ref 100–199)
HDL: 38 mg/dL — ABNORMAL LOW (ref >39)
LDL Chol Calc (NIH): 129 mg/dL — ABNORMAL HIGH (ref 0–99)
Triglycerides: 316 mg/dL — ABNORMAL HIGH (ref 0–149)
VLDL Cholesterol Cal: 56 mg/dL — ABNORMAL HIGH (ref 5–40)

## 2018-10-12 MED ORDER — FENOFIBRATE 145 MG PO TABS: 145.0000 mg | ORAL_TABLET | Freq: Every day | ORAL | 3 refills | Status: DC

## 2018-10-12 MED FILL — FENOFIBRATE 145 MG TABLET: 145 | 90 days supply | Qty: 90 | Fill #0

## 2018-10-17 ENCOUNTER — Encounter (HOSPITAL_COMMUNITY): Payer: Self-pay | Admitting: *Deleted

## 2018-10-17 ENCOUNTER — Other Ambulatory Visit: Payer: Self-pay

## 2018-10-17 ENCOUNTER — Emergency Department (HOSPITAL_COMMUNITY): Payer: Medicare Other

## 2018-10-17 ENCOUNTER — Emergency Department (HOSPITAL_COMMUNITY)
Admission: EM | Admit: 2018-10-17 | Discharge: 2018-10-17 | Disposition: A | Discharge status: Home / Self Care | Payer: Medicare Other | Attending: Emergency Medicine | Admitting: Emergency Medicine

## 2018-10-17 DIAGNOSIS — Z20828 Contact with and (suspected) exposure to other viral communicable diseases: Secondary | ICD-10-CM | POA: Diagnosis not present

## 2018-10-17 DIAGNOSIS — H913 Deaf nonspeaking, not elsewhere classified: Secondary | ICD-10-CM | POA: Diagnosis not present

## 2018-10-17 DIAGNOSIS — R0689 Other abnormalities of breathing: Secondary | ICD-10-CM | POA: Diagnosis not present

## 2018-10-17 DIAGNOSIS — R109 Unspecified abdominal pain: Secondary | ICD-10-CM | POA: Insufficient documentation

## 2018-10-17 DIAGNOSIS — J4541 Moderate persistent asthma with (acute) exacerbation: Secondary | ICD-10-CM

## 2018-10-17 DIAGNOSIS — Z87891 Personal history of nicotine dependence: Secondary | ICD-10-CM | POA: Insufficient documentation

## 2018-10-17 DIAGNOSIS — R404 Transient alteration of awareness: Secondary | ICD-10-CM | POA: Diagnosis not present

## 2018-10-17 DIAGNOSIS — R232 Flushing: Secondary | ICD-10-CM | POA: Insufficient documentation

## 2018-10-17 DIAGNOSIS — R52 Pain, unspecified: Secondary | ICD-10-CM

## 2018-10-17 DIAGNOSIS — R0602 Shortness of breath: Secondary | ICD-10-CM | POA: Diagnosis not present

## 2018-10-17 DIAGNOSIS — J80 Acute respiratory distress syndrome: Secondary | ICD-10-CM | POA: Diagnosis not present

## 2018-10-17 LAB — CBC WITH DIFFERENTIAL/PLATELET
Abs Immature Granulocytes: 0.09 10*3/uL — ABNORMAL HIGH (ref 0.00–0.07)
Basophils Absolute: 0 10*3/uL (ref 0.0–0.1)
Basophils Relative: 1 %
Eosinophils Absolute: 0.1 10*3/uL (ref 0.0–0.5)
Eosinophils Relative: 1 %
HCT: 43.8 % (ref 39.0–52.0)
Hemoglobin: 13.7 g/dL (ref 13.0–17.0)
Immature Granulocytes: 1 %
Lymphocytes Relative: 16 %
Lymphs Abs: 1.3 10*3/uL (ref 0.7–4.0)
MCH: 26.8 pg (ref 26.0–34.0)
MCHC: 31.3 g/dL (ref 30.0–36.0)
MCV: 85.7 fL (ref 80.0–100.0)
Monocytes Absolute: 0.4 10*3/uL (ref 0.1–1.0)
Monocytes Relative: 5 %
Neutro Abs: 6.1 10*3/uL (ref 1.7–7.7)
Neutrophils Relative %: 76 %
Platelets: 165 10*3/uL (ref 150–400)
RBC: 5.11 MIL/uL (ref 4.22–5.81)
RDW: 14.6 % (ref 11.5–15.5)
WBC: 8 10*3/uL (ref 4.0–10.5)
nRBC: 0 % (ref 0.0–0.2)

## 2018-10-17 LAB — BRAIN NATRIURETIC PEPTIDE: B Natriuretic Peptide: 19.4 pg/mL (ref 0.0–100.0)

## 2018-10-17 LAB — POCT I-STAT 7, (LYTES, BLD GAS, ICA,H+H)
Acid-base deficit: 3 mmol/L — ABNORMAL HIGH (ref 0.0–2.0)
Bicarbonate: 23.4 mmol/L (ref 20.0–28.0)
Calcium, Ion: 1.23 mmol/L (ref 1.15–1.40)
HCT: 43 % (ref 39.0–52.0)
Hemoglobin: 14.6 g/dL (ref 13.0–17.0)
O2 Saturation: 97 %
Patient temperature: 97
Potassium: 4 mmol/L (ref 3.5–5.1)
Sodium: 142 mmol/L (ref 135–145)
TCO2: 25 mmol/L (ref 22–32)
pCO2 arterial: 45.1 mmHg (ref 32.0–48.0)
pH, Arterial: 7.318 — ABNORMAL LOW (ref 7.350–7.450)
pO2, Arterial: 95 mmHg (ref 83.0–108.0)

## 2018-10-17 LAB — D-DIMER, QUANTITATIVE (NOT AT ARMC): D-Dimer, Quant: 0.35 ug/mL-FEU (ref 0.00–0.50)

## 2018-10-17 LAB — LIPASE, BLOOD: Lipase: 31 U/L (ref 11–51)

## 2018-10-17 LAB — TROPONIN I (HIGH SENSITIVITY)
Troponin I (High Sensitivity): 10 ng/L (ref <18)
Troponin I (High Sensitivity): 25 ng/L — ABNORMAL HIGH (ref <18)
Troponin I (High Sensitivity): 23 ng/L — ABNORMAL HIGH (ref <18)

## 2018-10-17 LAB — COMPREHENSIVE METABOLIC PANEL
ALT: 18 U/L (ref 0–44)
AST: 23 U/L (ref 15–41)
Albumin: 4.1 g/dL (ref 3.5–5.0)
Alkaline Phosphatase: 54 U/L (ref 38–126)
Anion gap: 10 (ref 5–15)
BUN: 11 mg/dL (ref 6–20)
CO2: 24 mmol/L (ref 22–32)
Calcium: 8.6 mg/dL — ABNORMAL LOW (ref 8.9–10.3)
Chloride: 106 mmol/L (ref 98–111)
Creatinine, Ser: 1.14 mg/dL (ref 0.61–1.24)
GFR calc Af Amer: >60 mL/min (ref >60)
GFR calc non Af Amer: >60 mL/min (ref >60)
Glucose, Bld: 104 mg/dL — ABNORMAL HIGH (ref 70–99)
Potassium: 4 mmol/L (ref 3.5–5.1)
Sodium: 140 mmol/L (ref 135–145)
Total Bilirubin: 0.2 mg/dL — ABNORMAL LOW (ref 0.3–1.2)
Total Protein: 7.2 g/dL (ref 6.5–8.1)

## 2018-10-17 LAB — SARS CORONAVIRUS 2 BY RT PCR (HOSPITAL ORDER, PERFORMED IN ~~LOC~~ HOSPITAL LAB): SARS Coronavirus 2: NEGATIVE

## 2018-10-17 IMAGING — DX DG ABDOMEN 2V
4 series · 4 of 4 positions shown · non-contrast
Comparison: None.

CLINICAL DATA: Abdominal pain

EXAM:
ABDOMEN - 2 VIEW

[abdomen erect (1 of 2)]
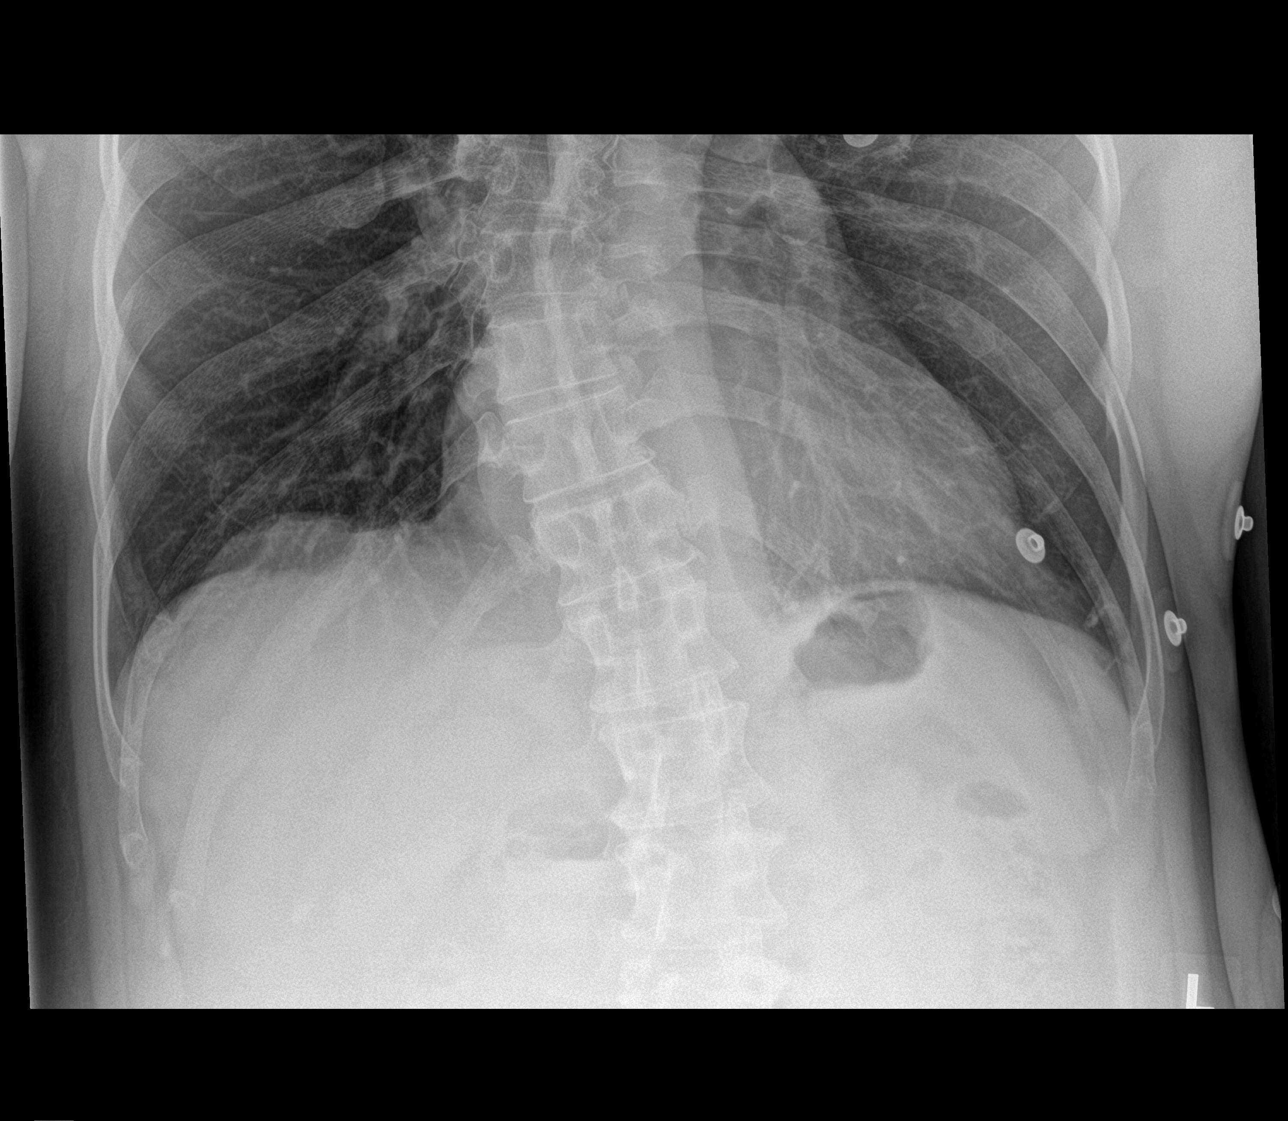

[abdomen supine (1 of 2)]
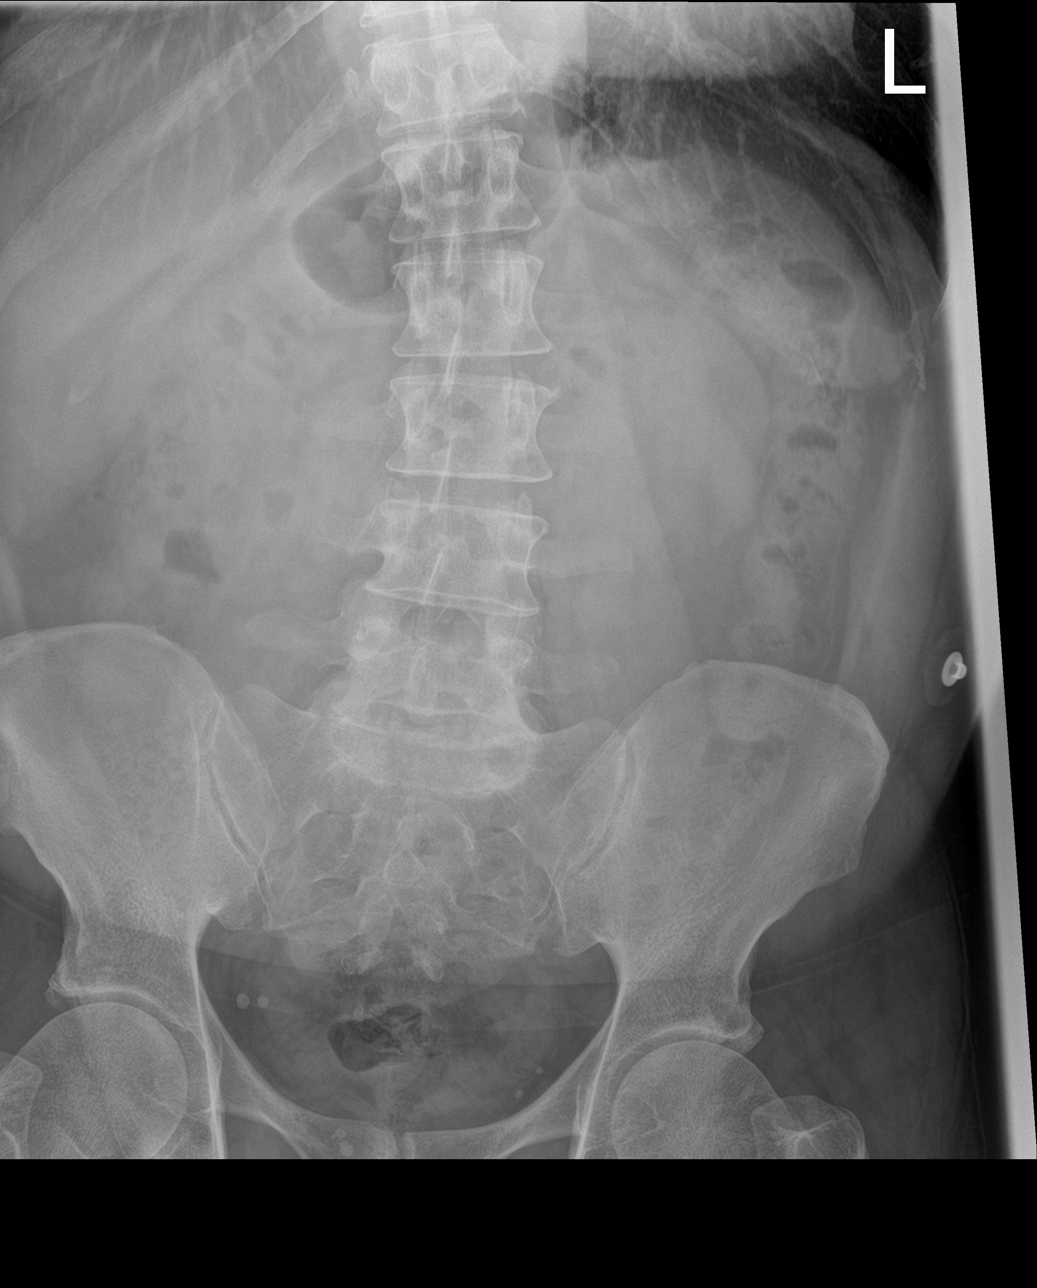

[abdomen supine (2 of 2)]
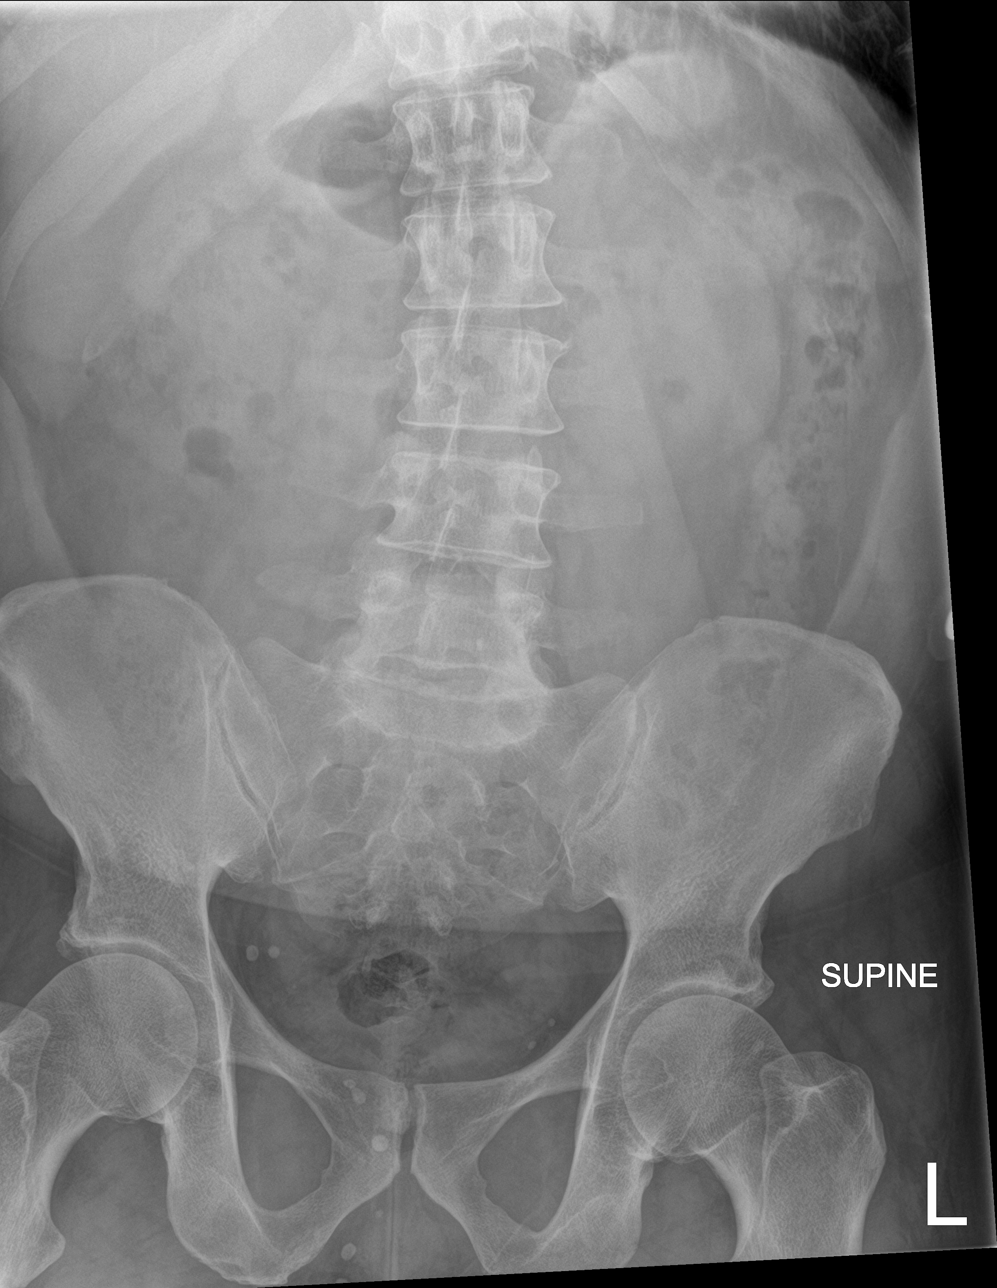

[abdomen erect (2 of 2)]
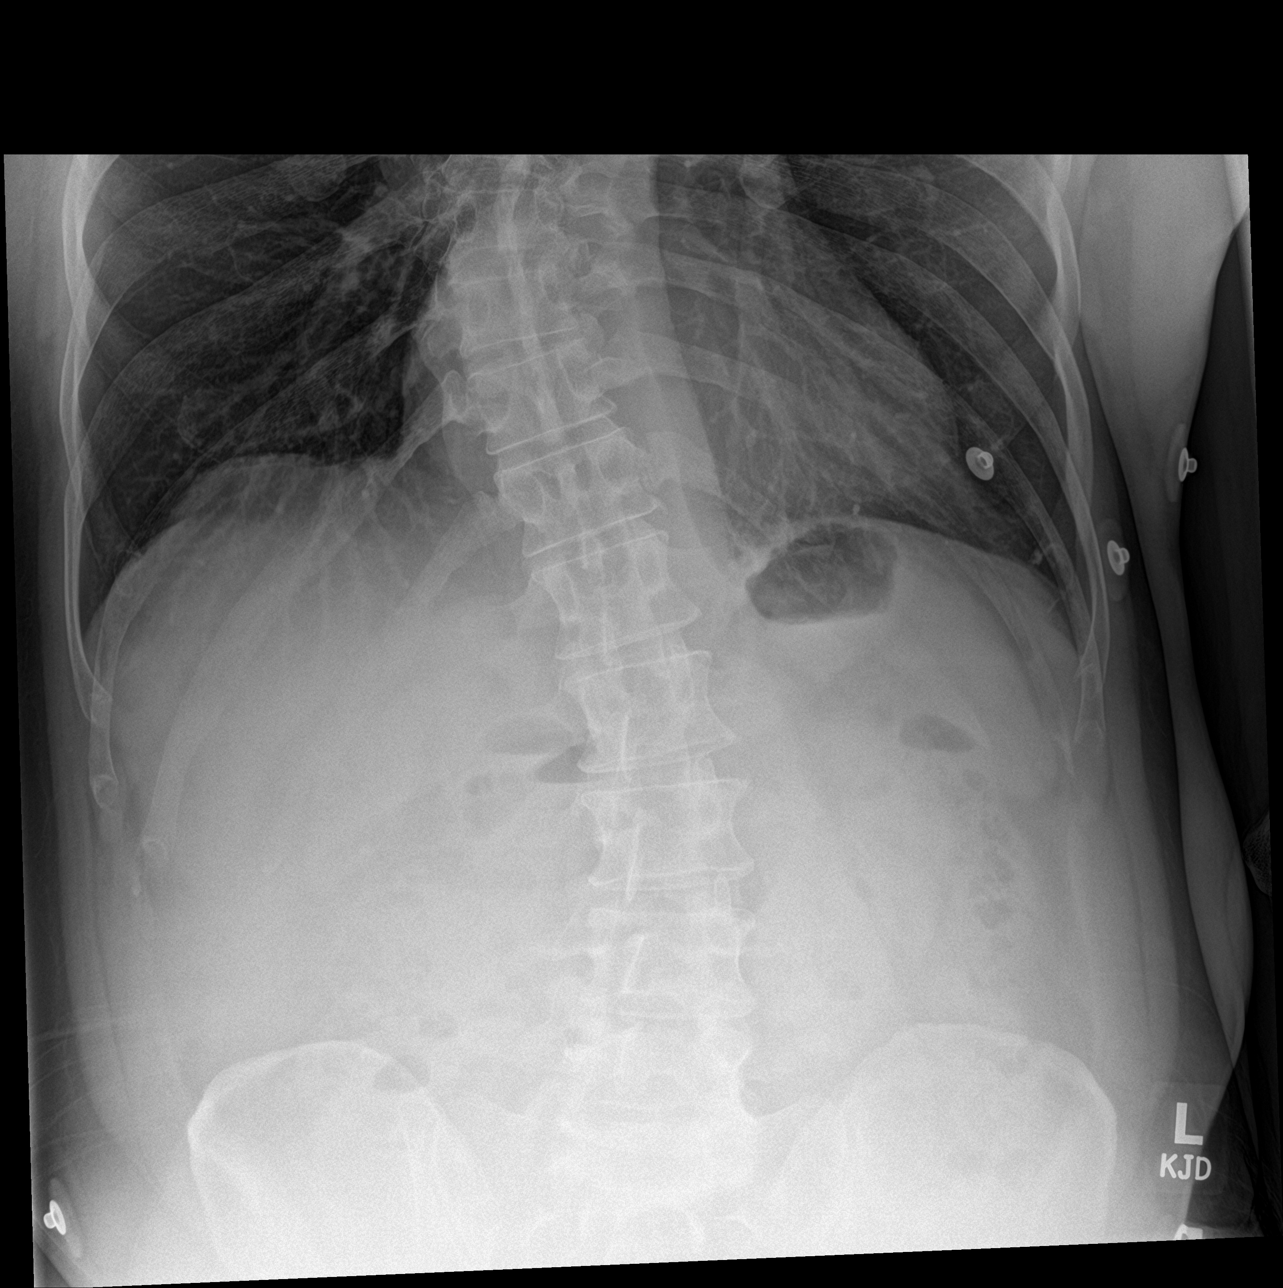

[4 of 4 positions shown; findings below may reference images not displayed]

FINDINGS: The bowel gas pattern is normal. There is no evidence of free air.
No radio-opaque calculi or other significant radiographic
abnormality is seen.
IMPRESSION: Negative.

## 2018-10-17 IMAGING — CT CT ANGIO CHEST-ABD-PELV FOR DISSECTION W/ AND WO/W CM
2 of 7 series · 14 of 46 positions shown, 16 images · IV contrast (omnipaque)
Comparison: None.

CLINICAL DATA: Acute aortic syndrome, chest pain, no prior aortic
intervention. Abdominal pain and shortness of breath.

EXAM:
CT ANGIOGRAPHY CHEST, ABDOMEN AND PELVIS
TECHNIQUE: Multidetector CT imaging through the chest, abdomen and pelvis was
performed using the standard protocol during bolus administration of
intravenous contrast. Multiplanar reconstructed images and MIPs were
obtained and reviewed to evaluate the vascular anatomy.
CONTRAST:  100mL OMNIPAQUE IOHEXOL 350 MG/ML SOLN

[Series 7: dissection 2mm · axial · 0.70mm/px · z∈[+1010,+1576]mm · 11 of 319 slices shown, 13 images]
[im 18/319  soft-tissue]
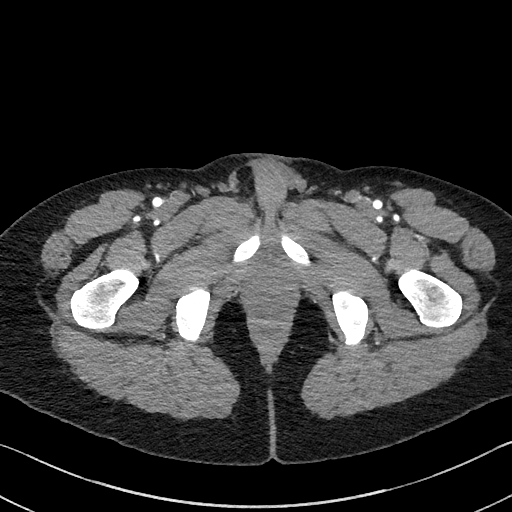
[im 18/319  bone]
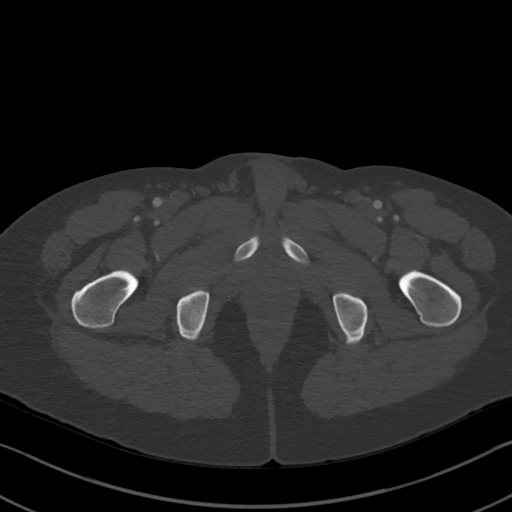
[im 54/319  soft-tissue]
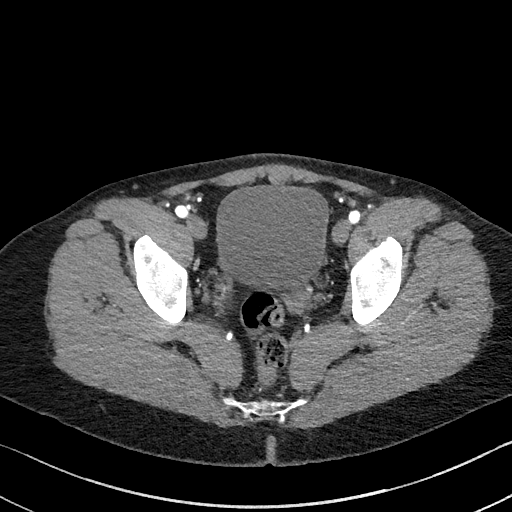
[im 71/319  soft-tissue]
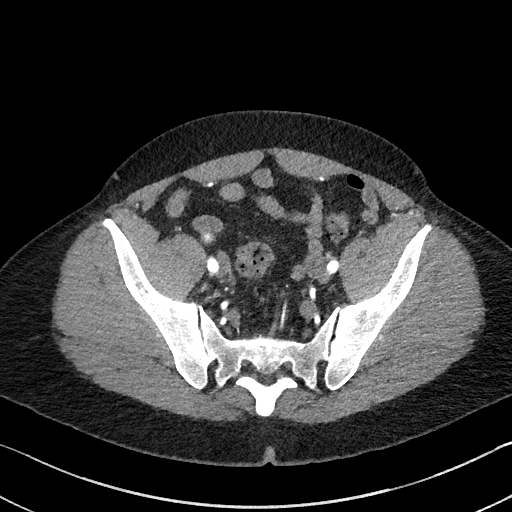
[im 107/319  soft-tissue]
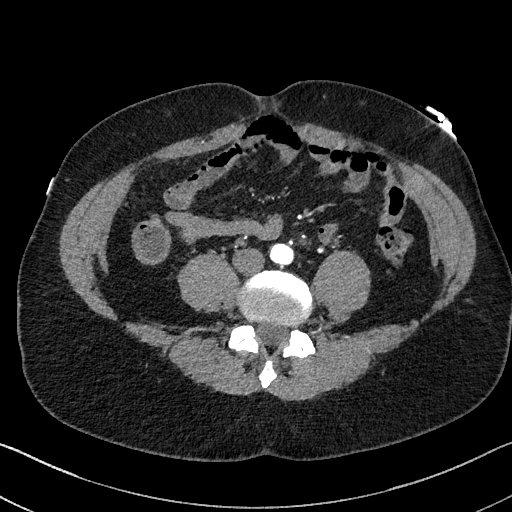
[im 124/319  soft-tissue]
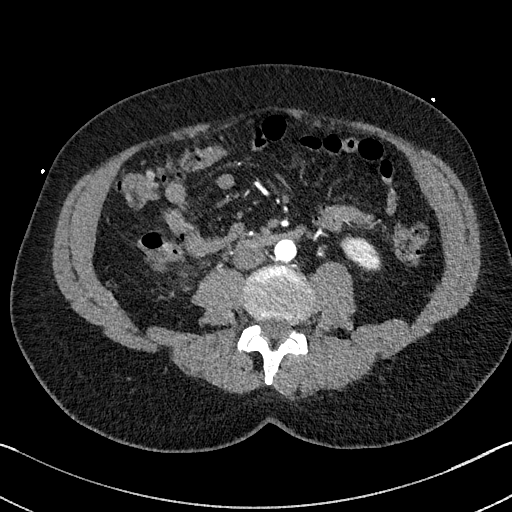
[im 160/319  soft-tissue]
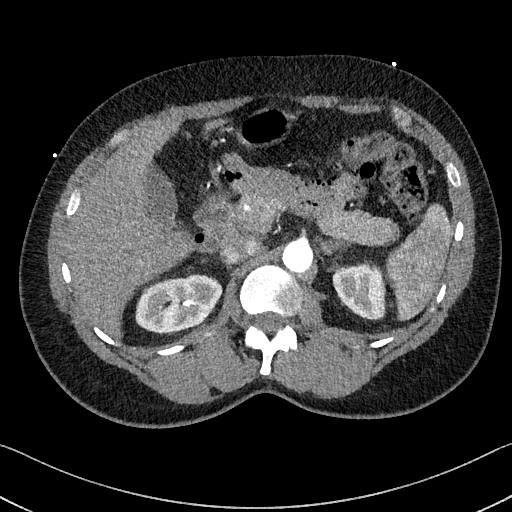
[im 195/319  soft-tissue]
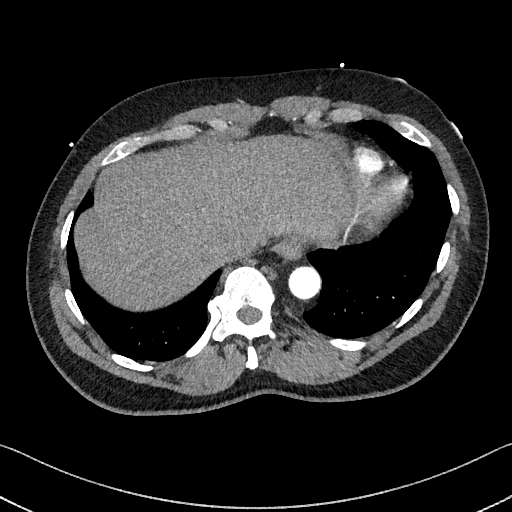
[im 213/319  soft-tissue]
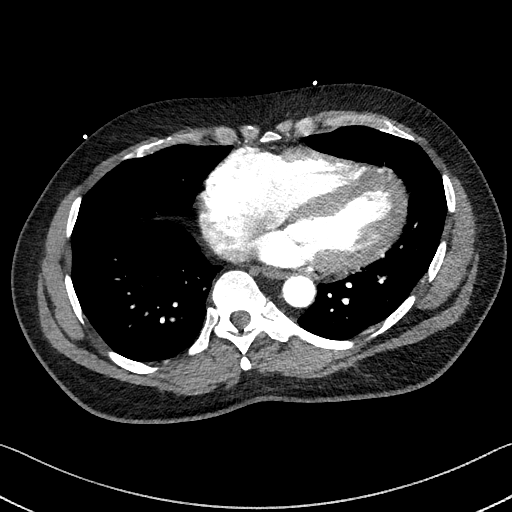
[im 248/319  soft-tissue]
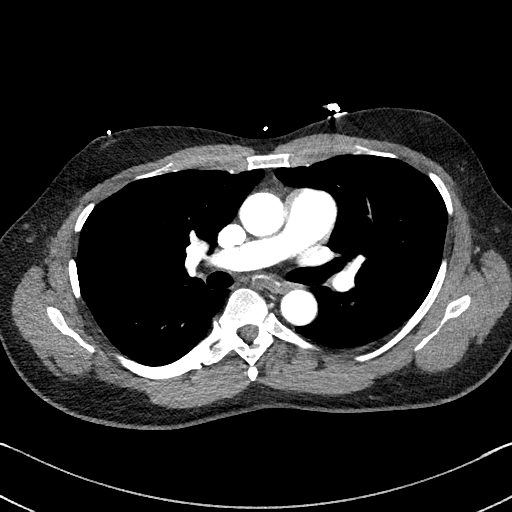
[im 248/319  bone]
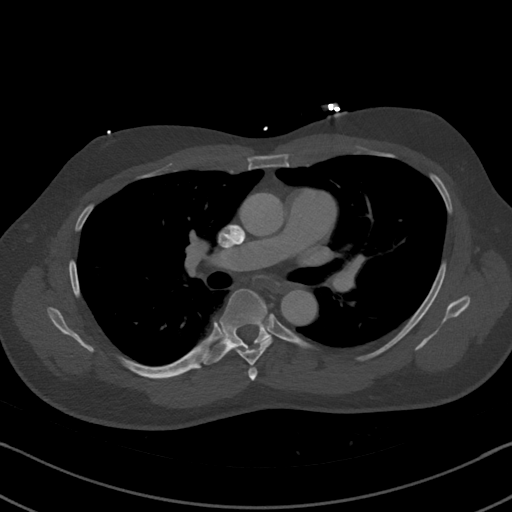
[im 266/319  soft-tissue]
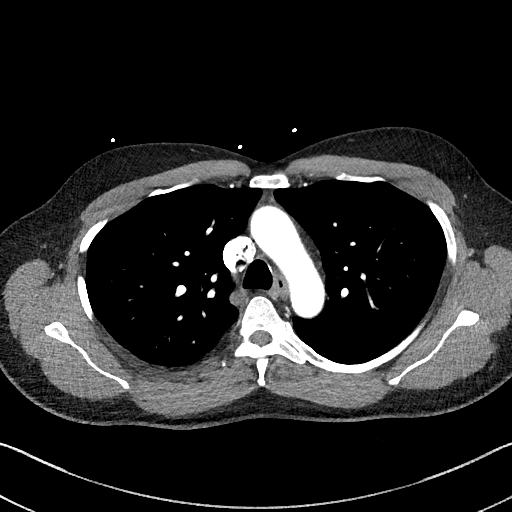
[im 301/319  soft-tissue]
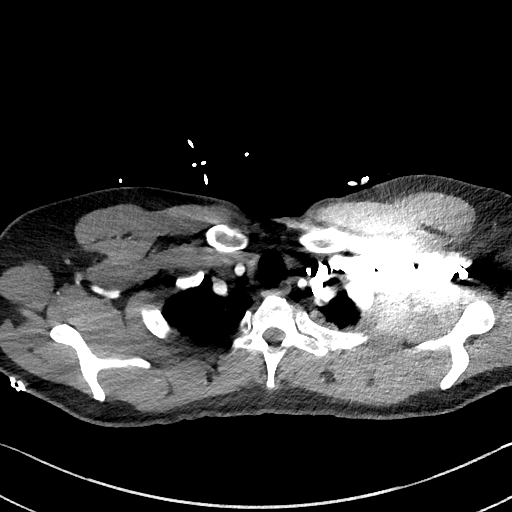

[Series 10: dissection 2mm cor · coronal · 0.75mm/px · 3 of 122 slices shown]
[im 31/122  soft-tissue]
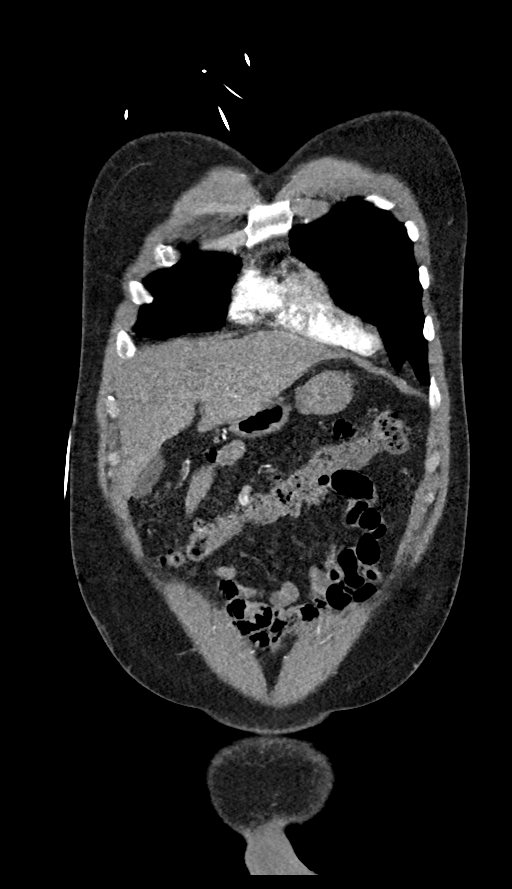
[im 61/122  soft-tissue]
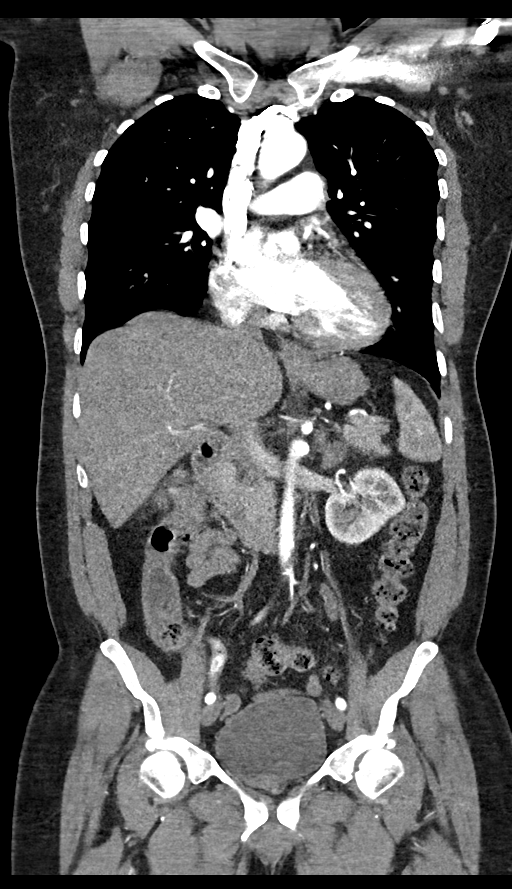
[im 91/122  soft-tissue]
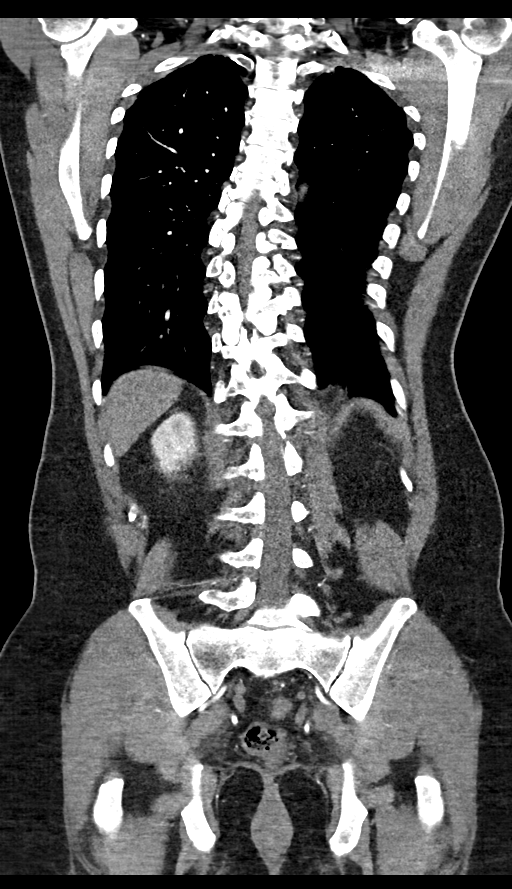

[14 of 46 positions shown; findings below may reference images not displayed]

FINDINGS: CTA CHEST FINDINGS

Cardiovascular: Preferential opacification of the thoracic aorta. No
evidence of thoracic aortic aneurysm or dissection. Normal heart
size. No pericardial effusion. Mild thoracic aortic atherosclerosis.

Mediastinum/Nodes: No enlarged mediastinal, hilar, or axillary lymph
nodes. Thyroid gland, trachea, and esophagus demonstrate no
significant findings.

Lungs/Pleura: Lungs are clear. No pleural effusion or pneumothorax.

Musculoskeletal: No chest wall abnormality. No acute or significant
osseous findings.

Review of the MIP images confirms the above findings.

CTA ABDOMEN AND PELVIS FINDINGS

VASCULAR

Aorta: Normal caliber aorta without aneurysm, dissection, vasculitis
or significant stenosis.

Celiac: Patent without evidence of aneurysm, dissection, vasculitis
or significant stenosis.

SMA: Patent without evidence of aneurysm, dissection, vasculitis or
significant stenosis.

Renals: Both renal arteries are patent without evidence of aneurysm,
dissection, vasculitis, fibromuscular dysplasia or significant
stenosis.

IMA: Patent without evidence of aneurysm, dissection, vasculitis or
significant stenosis.

Inflow: Patent without evidence of aneurysm, dissection, vasculitis
or significant stenosis.

Veins: No obvious venous abnormality within the limitations of this
arterial phase study.

Review of the MIP images confirms the above findings.

NON-VASCULAR

Hepatobiliary: No focal liver abnormality is seen. No gallstones,
gallbladder wall thickening, or biliary dilatation.

Pancreas: Unremarkable. No pancreatic ductal dilatation or
surrounding inflammatory changes.

Spleen: Normal in size without focal abnormality.

Adrenals/Urinary Tract: Adrenal glands are unremarkable. Kidneys are
normal, without renal calculi, focal lesion, or hydronephrosis.
Bladder is unremarkable.

Stomach/Bowel: Stomach is within normal limits. No evidence of bowel
wall thickening, distention, or inflammatory changes. Diverticulosis
without evidence of diverticulitis.

Lymphatic: No lymphadenopathy.

Reproductive: Prostate is unremarkable.

Other: No abdominal wall hernia or abnormality. No abdominopelvic
ascites.

Musculoskeletal: No acute or significant osseous findings.
Degenerative disease with disc height loss at L5-S1.

Review of the MIP images confirms the above findings.
IMPRESSION: 1. No aortic aneurysm or aortic dissection.
2. No acute injury of the chest, abdomen or pelvis. No acute
cardiopulmonary disease. No acute pathology of the abdomen and
pelvis.

## 2018-10-17 IMAGING — DX DG CHEST 1V PORT
2 series · 2 of 2 positions shown · non-contrast
Comparison: Discovered IP [MC]

CLINICAL DATA: Shortness of breath

EXAM:
PORTABLE CHEST 1 VIEW

[chest ap (1 of 2)]
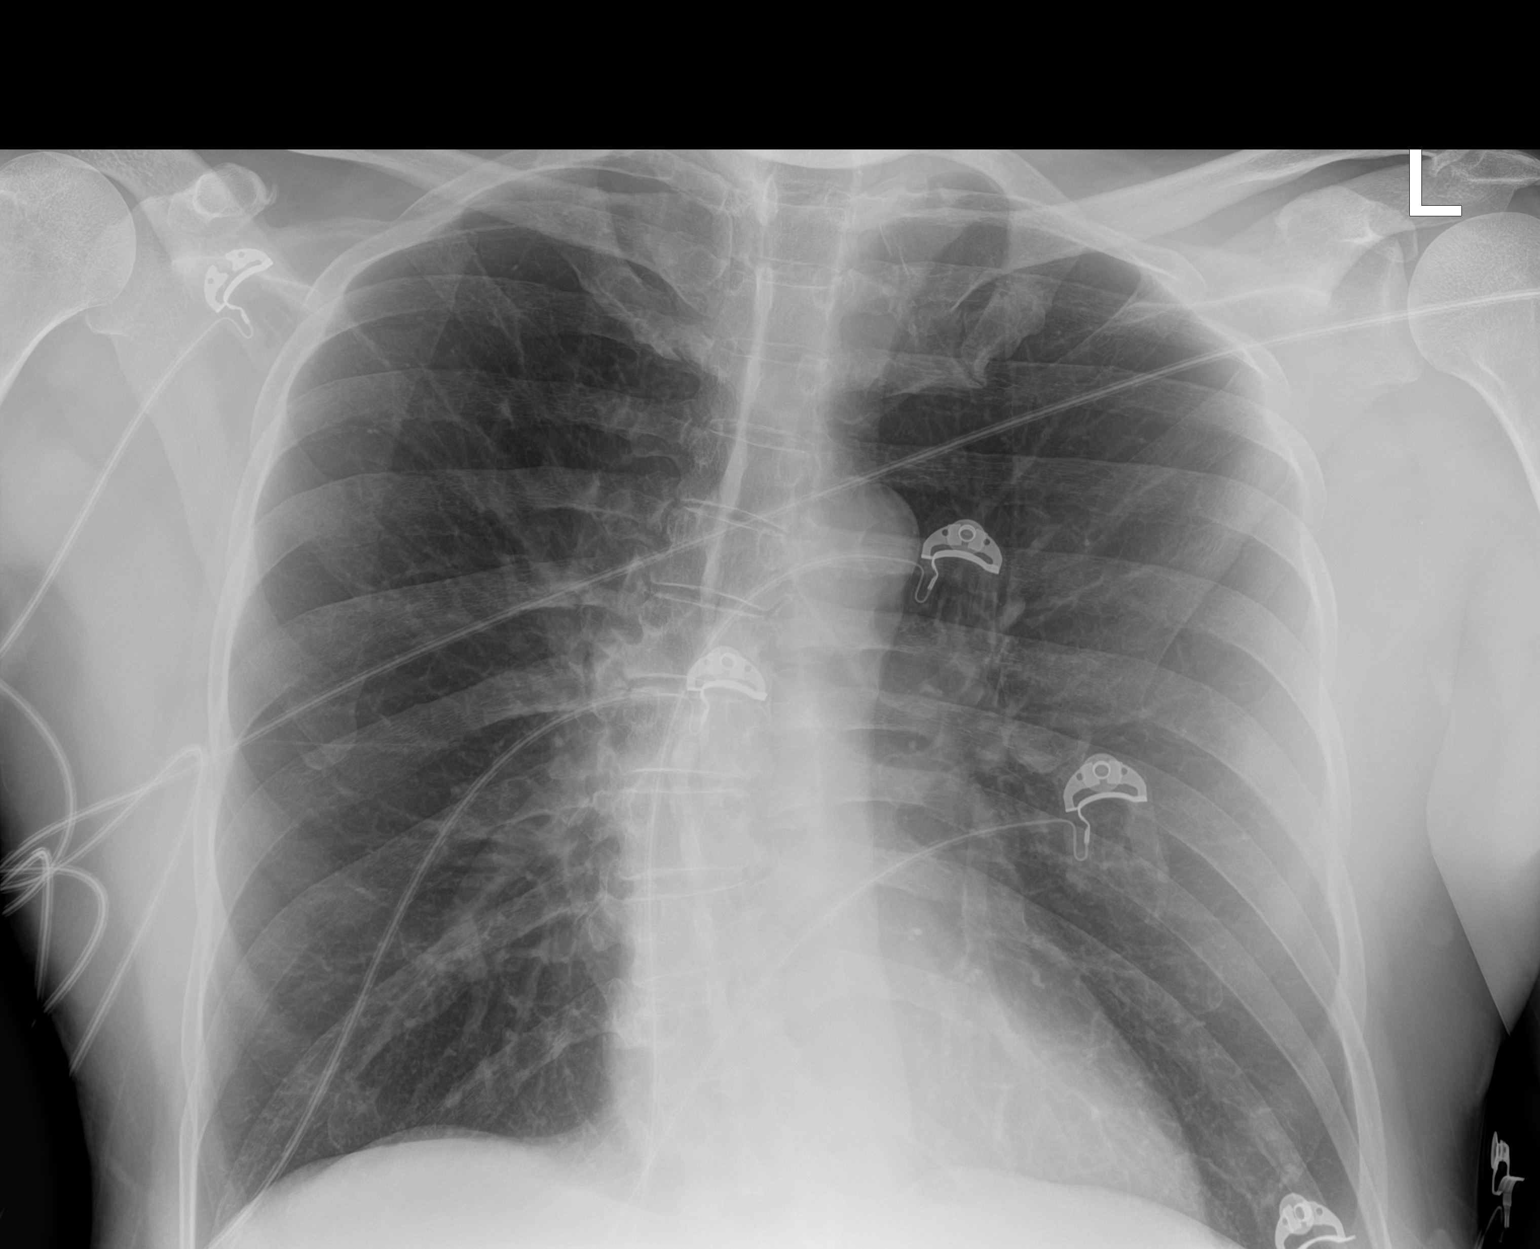

[chest ap (2 of 2)]
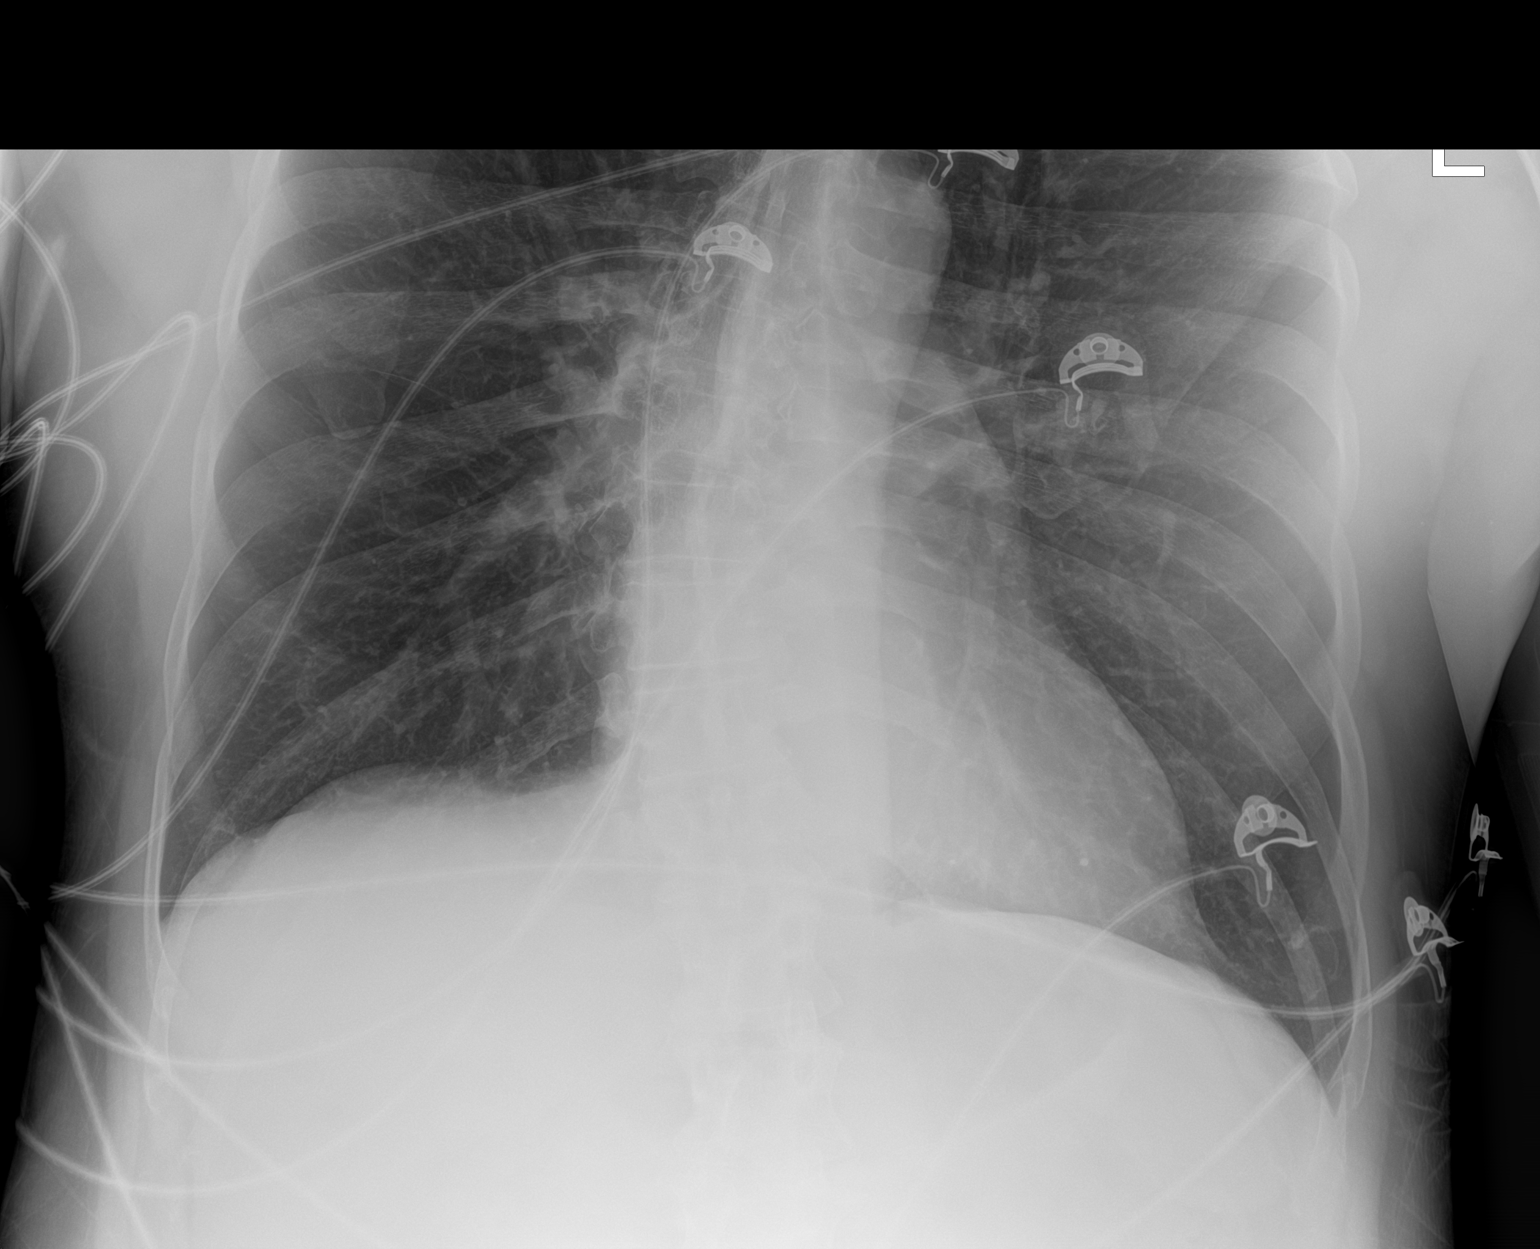

[2 of 2 positions shown; findings below may reference images not displayed]

FINDINGS: The heart size and mediastinal contours are within normal limits.
There is hyperinflation of the upper lung zones. No airspace
consolidation or pleural effusion. The visualized skeletal
structures are unremarkable.
IMPRESSION: No active disease.

## 2018-10-17 MED ORDER — ALBUTEROL SULFATE HFA 108 (90 BASE) MCG/ACT IN AERS
8.0000 | INHALATION_SPRAY | Freq: Once | RESPIRATORY_TRACT | Status: AC
  Administered 2018-10-17: 8 via RESPIRATORY_TRACT
  Filled 2018-10-17: qty 6.7

## 2018-10-17 MED ORDER — ASPIRIN 81 MG PO CHEW
324.0000 mg | CHEWABLE_TABLET | Freq: Once | ORAL | Status: AC
  Administered 2018-10-17: 09:00:00 324 mg via ORAL
  Filled 2018-10-17: qty 4

## 2018-10-17 MED ORDER — IPRATROPIUM-ALBUTEROL 0.5-2.5 (3) MG/3ML IN SOLN
3.0000 mL | Freq: Once | RESPIRATORY_TRACT | Status: AC
  Administered 2018-10-17: 06:00:00 3 mL via RESPIRATORY_TRACT
  Filled 2018-10-17: qty 3

## 2018-10-17 MED ORDER — PREDNISONE 50 MG PO TABS: ORAL_TABLET | ORAL | 0 refills | Status: DC

## 2018-10-17 MED ORDER — IOHEXOL 350 MG/ML SOLN
100.0000 mL | Freq: Once | INTRAVENOUS | Status: AC | PRN
  Administered 2018-10-17: 07:00:00 100 mL via INTRAVENOUS

## 2018-10-17 MED ORDER — FENTANYL CITRATE (PF) 100 MCG/2ML IJ SOLN
50.0000 ug | Freq: Once | INTRAMUSCULAR | Status: AC
  Administered 2018-10-17: 09:00:00 50 ug via INTRAVENOUS
  Filled 2018-10-17: qty 2

## 2018-10-17 MED ORDER — NITROGLYCERIN 0.4 MG SL SUBL: 0.4000 mg | SUBLINGUAL_TABLET | Freq: Once | SUBLINGUAL | Status: DC

## 2018-10-17 MED ORDER — SODIUM CHLORIDE 0.9 % IV BOLUS
1000.0000 mL | Freq: Once | INTRAVENOUS | Status: AC
  Administered 2018-10-17: 08:00:00 1000 mL via INTRAVENOUS

## 2018-10-17 NOTE — ED Notes (Signed)
Pt in CT.

## 2018-10-17 NOTE — ED Notes (Signed)
IV attempted without success. 

## 2018-10-17 NOTE — ED Notes (Signed)
Phlebotomy at bedside.

## 2018-10-17 NOTE — ED Triage Notes (Signed)
Per EMS, pt had sudden onset of sob tonight with initial oxygen sats 61% on room air with nearly absent lung sounds. EMS reported GCS 8 and hypertension. EMS gave 0.3mg  epi IM, 125mg  Solumedrol, 2g Mag, and a duoneb.   Pt is deaf. Per interpreter, pt reported waking up with severe abd pain in which in felt like he had to have diarrhea. Then he felt very hot and flushed prior to feeling sob. Reports seeing his PCP recently for abd pain and given several different medications that he hasn't started using yet.   Admits to ETOH and marijuana use.

## 2018-10-17 NOTE — Discharge Instructions (Addendum)
Please call both the cardiology office as well as your primary care office to schedule follow-up appointment.  Please see your primary doctor in 2 days for close recheck.  Recommend discussing the symptoms you are having as well as any need for ongoing testing as an outpatient.  If in the meantime you develop worsening chest pain, difficulty breathing or other new concerning symptom please return to ER for reassessment.

## 2018-10-17 NOTE — ED Provider Notes (Signed)
53 y/o male presented with shortness of breath, episode of chest and abdominal pain. Given neb, steroids, epi. Wheezing significantly improved. CTA chest/abd/pelvis ordered to rule out dissection.  Repeat troponin and CTA reads are pending at time of signout.  If patient's breathing remained stable, CT images and repeat troponin within normal limits, likely can be discharged home for management of suspected asthma exacerbation.  7:14 AM Received signout from Dr. Wyvonnia Dusky  8:02 AM reviewed CT results, elevated troponin, reassessed patient, having mild substernal chest pain with left arm tingling, repeat EKG, nitro, ASA, will c/s cardiology  8:12 AM discussed with cardiology - camnitz - recommends repeat troponin, if stable or downtrending, then dc home with follow up in clinic  10:47 AM recheck patient, repeat troponin downtrending, plan for discharge home with PCP and cardiology follow-up, reviewed return precautions, believe he is stable for outpatient management of his asthma exacerbation at this time, given conversation with cardiology, downtrending troponin and no ischemic changes on ekg, very low suspicion for ACS; suspect the elevation in high sensitivity trop related more to asthma, possibly administration of epi and albuterol, will start on course of steroids. Patient has inhaler at home.     After the discussed management above, the patient was determined to be safe for discharge.  The patient was in agreement with this plan and all questions regarding their care were answered.  ED return precautions were discussed and the patient will return to the ED with any significant worsening of condition.      Lucrezia Starch, MD 10/17/18 1050

## 2018-10-17 NOTE — ED Provider Notes (Signed)
[facility] EMERGENCY DEPARTMENT Provider Note   CSN: [id number] Arrival date & time: 10/17/18  [id number]     History   Chief Complaint Chief Complaint  Patient presents with  . Shortness of Breath    HPI Alexander Denn. is a 53 y.o. male.     Level 5 caveat for deafness and respiratory distress.  Patient with history of asthma brought in by EMS with sudden onset difficulty breathing and hypoxia.  EMS reports breath sounds were absent and his O2 saturation was 61% on room air.  Initial GCS was 8.  EMS gave epinephrine, Solu-Medrol, magnesium and a DuoNeb. Patient is awake on arrival. He states he was drinking and playing games with friends.  He was watching a movie by himself when he had the sudden urge to have a bowel movement and had some abdominal pain.  He got very hot and flushed prior to the onset of his shortness of breath.  States he recently saw his doctor for abdominal pain was prescribed a medication has not started yet.  He never did have a bowel movement but became more short of breath more anxious and had hot flashes and EMS was called.  His abdominal pain has improved.  He is having some chest tightness currently.  Denies any fevers, chills, nausea or vomiting.   The history is provided by the patient and the EMS personnel. The history is limited by a language barrier and the condition of the patient.  Shortness of Breath   Past Medical History:  Diagnosis Date  . Arthritis    KNEES  . Asthma   . Deaf   . GERD (gastroesophageal reflux disease)    OCC  . Scrotal cyst   . Thyroid disease   . Urinary frequency    OCC    Patient Active Problem List   Diagnosis Date Noted  . Asthma exacerbation 09/01/2016  . Chronic asthma, severe persistent, with acute exacerbation 08/10/2016    Past Surgical History:  Procedure Laterality Date  . BLADDER SURGERY  YRS AGO  . EPIDIDYMECTOMY Right 03/22/2018   Procedure: SCROTAL EXPLORATION RIGHT   PARTIAL EPIDIDYMECTOMY;  Surgeon: [name], MD;  Location: [facility];  Service: Urology;  Laterality: Right;  . HEMORRHOID SURGERY  WHEN YOUNG  . NO PAST SURGERIES    . VASECTOMY  YRS AGO        Home Medications    Prior to Admission medications   Medication Sig Start Date End Date Taking? Authorizing Provider  albuterol (PROVENTIL) (2.5 MG/3ML) 0.083% nebulizer solution Take 3 mLs (2.5 mg total) by nebulization every 6 (six) hours as needed for wheezing or shortness of breath. 01/28/18 01/28/19  Elsie Stain, MD  albuterol (VENTOLIN HFA) 108 (90 Base) MCG/ACT inhaler Inhale 2 puffs into the lungs every 4 (four) hours as needed for wheezing or shortness of breath. 10/11/18   [name], NP  fenofibrate (TRICOR) 145 MG tablet Take 1 tablet (145 mg total) by mouth daily. 10/12/18   [name], NP  fluticasone furoate-vilanterol (BREO ELLIPTA) 200-25 MCG/INH AEPB Inhale 1 puff into the lungs daily. 10/11/18   [name], NP  loratadine (CLARITIN) 10 MG tablet Take 1 tablet (10 mg total) by mouth daily. 10/11/18   [name], NP  montelukast (SINGULAIR) 10 MG tablet Take 1 tablet (10 mg total) by mouth at bedtime. 10/11/18   [name], NP    Family History  Family History  Problem Relation Age of Onset  . Heart disease Father     Social History Social History   Tobacco Use  . Smoking status: Former Smoker    Types: Cigarettes    Quit date: 06/24/2016    Years since quitting: 2.3  . Smokeless tobacco: Never Used  Substance Use Topics  . Alcohol use: Yes    Comment: OCC  . Drug use: Yes    Types: Marijuana    Comment: 3 MONTHS AGO     Allergies   Cheese, Eggs or egg-derived products, and Penicillins   Review of Systems Review of Systems  Unable to perform ROS: Severe respiratory distress  Respiratory: Positive for shortness of breath.      Physical Exam Updated Vital Signs BP (!) 151/64   Pulse  (!) 108   Temp (!) 97 F (36.1 C)   Resp 20   SpO2 96%   Physical Exam Vitals signs and nursing note reviewed.  Constitutional:      General: He is in acute distress.     Appearance: He is well-developed.     Comments: Moderate respiratory distress, tachypneic,  HENT:     Head: Normocephalic and atraumatic.     Mouth/Throat:     Pharynx: No oropharyngeal exudate.  Eyes:     Conjunctiva/sclera: Conjunctivae normal.     Pupils: Pupils are equal, round, and reactive to light.  Neck:     Musculoskeletal: Normal range of motion and neck supple.     Comments: No meningismus. Cardiovascular:     Rate and Rhythm: Normal rate and regular rhythm.     Heart sounds: Normal heart sounds. No murmur.  Pulmonary:     Effort: Respiratory distress present.     Breath sounds: Wheezing present.     Comments: Tachypnea with minimal air exchange bilaterally Abdominal:     General: There is distension.     Palpations: Abdomen is soft.     Tenderness: There is no abdominal tenderness. There is no guarding or rebound.  Musculoskeletal: Normal range of motion.        General: No tenderness.  Skin:    General: Skin is warm.     Capillary Refill: Capillary refill takes less than 2 seconds.  Neurological:     General: No focal deficit present.     Mental Status: He is alert and oriented to person, place, and time. Mental status is at baseline.     Cranial Nerves: No cranial nerve deficit.     Motor: No abnormal muscle tone.     Coordination: Coordination normal.     Comments: No ataxia on finger to nose bilaterally. No pronator drift. 5/5 strength throughout. CN 2-12 intact.Equal grip strength. Sensation intact.   Psychiatric:        Behavior: Behavior normal.      ED Treatments / Results  Labs (all labs ordered are listed, but only abnormal results are displayed) Labs Reviewed  CBC WITH DIFFERENTIAL/PLATELET - Abnormal; Notable for the following components:      Result Value   Abs  Immature Granulocytes 0.09 (*)    All other components within normal limits  COMPREHENSIVE METABOLIC PANEL - Abnormal; Notable for the following components:   Glucose, Bld 104 (*)    Calcium 8.6 (*)    Total Bilirubin 0.2 (*)    All other components within normal limits  POCT I-STAT 7, (LYTES, BLD GAS, ICA,H+H) - Abnormal; Notable for the following components:   pH,  Arterial 7.318 (*)    Acid-base deficit 3.0 (*)    All other components within normal limits  TROPONIN I (HIGH SENSITIVITY) - Abnormal; Notable for the following components:   Troponin I (High Sensitivity) 25 (*)    All other components within normal limits  SARS CORONAVIRUS 2 (HOSPITAL ORDER, [facility])  BRAIN NATRIURETIC PEPTIDE  D-DIMER, QUANTITATIVE (NOT AT [facility])  LIPASE, BLOOD  I-STAT ARTERIAL BLOOD GAS, ED  TROPONIN I (HIGH SENSITIVITY)    EKG EKG Interpretation  Date/Time:  Sunday October 17 2018 03:33:24 EDT Ventricular Rate:  105 PR Interval:    QRS Duration: 91 QT Interval:  354 QTC Calculation: 468 R Axis:   79 Text Interpretation:  Sinus tachycardia Probable left atrial enlargement No significant change was found Confirmed by Ezequiel Essex [phone]) on 10/17/2018 3:41:03 AM   Radiology Dg Chest Portable 1 View  Result Date: 10/17/2018 CLINICAL DATA:  Shortness of breath EXAM: PORTABLE CHEST 1 VIEW COMPARISON:  Discovered IP 2019 FINDINGS: The heart size and mediastinal contours are within normal limits. There is hyperinflation of the upper lung zones. No airspace consolidation or pleural effusion. The visualized skeletal structures are unremarkable. IMPRESSION: No active disease. Electronically Signed   By: Prudencio Pair M.D.   On: 10/17/2018 03:47    Procedures Procedures (including critical care time)  Medications Ordered in ED Medications  albuterol (VENTOLIN HFA) 108 (90 Base) MCG/ACT inhaler 8 puff (has no administration in time range)     Initial Impression /  Assessment and Plan / ED Course  I have reviewed the triage vital signs and the nursing notes.  Pertinent labs & imaging results that were available during my care of the patient were reviewed by me and considered in my medical decision making (see chart for details).       Respiratory distress with history of asthma.  Received epinephrine, Solu-Medrol, magnesium from EMS.  EKG is nonischemic.  Patient moving minimal air on arrival with wheezing and hypoxia and tachypnea. He received epinephrine, Solu-Medrol magnesium as well as bronchodilators.  Nonrebreather weaned to nasal cannula.  Breath sounds very diminished.  Patient given bronchodilators. Coronavirus swab sent.  Work of breathing much improved after above treatments.  Coronavirus is negative.  X-ray shows no pneumothorax or infiltrate.  Patient states he still having chest tightness and abdominal pain.  Given his sudden onset abdominal pain that radiated to his chest he will be sent for CT scan to rule out pathology such as aortic dissection.  Patient is feeling improved.  He reports no abdominal pain at this time.  He is awaiting CT scan.  He will also need to be ambulated to ensure no desaturation.  Second troponin pending.  Care transferred to Dr. Roslynn Amble at shift change.   Alexander DEIGHAN Sr. was evaluated in Emergency Department on 10/17/2018 for the symptoms described in the history of present illness. He was evaluated in the context of the global COVID-19 pandemic, which necessitated consideration that the patient might be at risk for infection with the SARS-CoV-2 virus that causes COVID-19. Institutional protocols and algorithms that pertain to the evaluation of patients at risk for COVID-19 are in a state of rapid change based on information released by regulatory bodies including the CDC and federal and state organizations. These policies and algorithms were followed during the patient's care in the ED.  CRITICAL CARE  Performed by: Ezequiel Essex Total critical care time: 45 minutes Critical care time was exclusive of  separately billable procedures and treating other patients. Critical care was necessary to treat or prevent imminent or life-threatening deterioration. Critical care was time spent personally by me on the following activities: development of treatment plan with patient and/or surrogate as well as nursing, discussions with consultants, evaluation of patient's response to treatment, examination of patient, obtaining history from patient or surrogate, ordering and performing treatments and interventions, ordering and review of laboratory studies, ordering and review of radiographic studies, pulse oximetry and re-evaluation of patient's condition.    Final Clinical Impressions(s) / ED Diagnoses   Final diagnoses:  Moderate persistent asthma with exacerbation    ED Discharge Orders    None       Sena Hoopingarner, Annie Main, MD 10/17/18 [phone]

## 2018-10-18 MED FILL — predniSONE 10 MG TABS: 10 | 5 days supply | Qty: 25 | Fill #0

## 2018-10-19 ENCOUNTER — Telehealth (INDEPENDENT_AMBULATORY_CARE_PROVIDER_SITE_OTHER): Payer: Self-pay

## 2018-10-19 NOTE — Telephone Encounter (Signed)
-----   Message from [name], NP sent at 10/12/2018  8:50 AM EDT ----- I have reviewed all labs and they are normal/unremarkable. Except Cholesterol Your LDL is not in range. Your LDL is the bad cholesterol that can lead to heart attack and stroke. To lower your number you can decrease your fatty foods, red meat, cheese, milk and increase fiber like whole grains and veggies. You can also add a fiber supplement like Metamucil or Benefiber. Added Atorvastatin 40mg  at night

## 2018-10-19 NOTE — Telephone Encounter (Signed)
Patient is aware that labs are normal except elevated cholesterol which can lead to stroke or heart attack. Advised patient to decrease fatty foods, red meat, milk and cheese and increase fiber with whole grains and veggies. Fenofibrate sent to pharmacy to help lower cholesterol. Nat Christen, CMA

## 2018-10-28 IMAGING — US US SCROTUM W/ DOPPLER COMPLETE
1 series · 13 of 25 positions shown · non-contrast
Comparison: [DATE]

CLINICAL DATA: Varicocele

EXAM:
SCROTAL ULTRASOUND
DOPPLER ULTRASOUND OF THE TESTICLES
TECHNIQUE: Complete ultrasound examination of the testicles, epididymis, and
other scrotal structures was performed. Color and spectral Doppler
ultrasound were also utilized to evaluate blood flow to the
testicles.

[Series 1: us scrotum w/ doppler complete · 0.07mm/px · 13 of 66 slices shown]
[im 1/66]
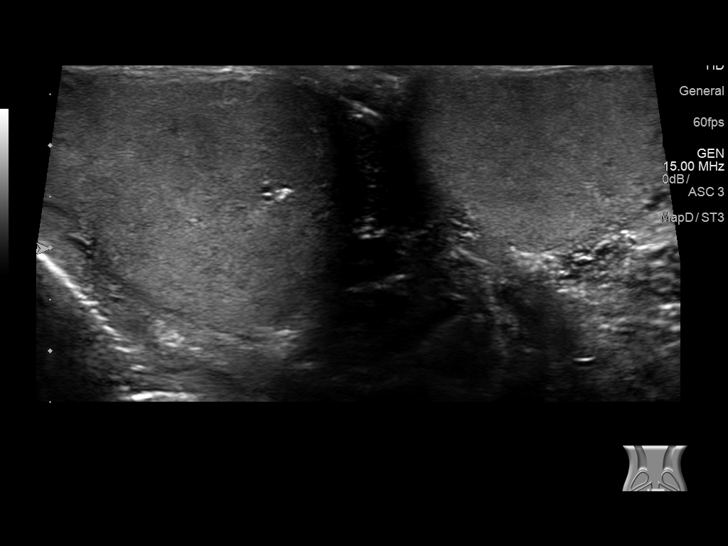
[im 6/66]
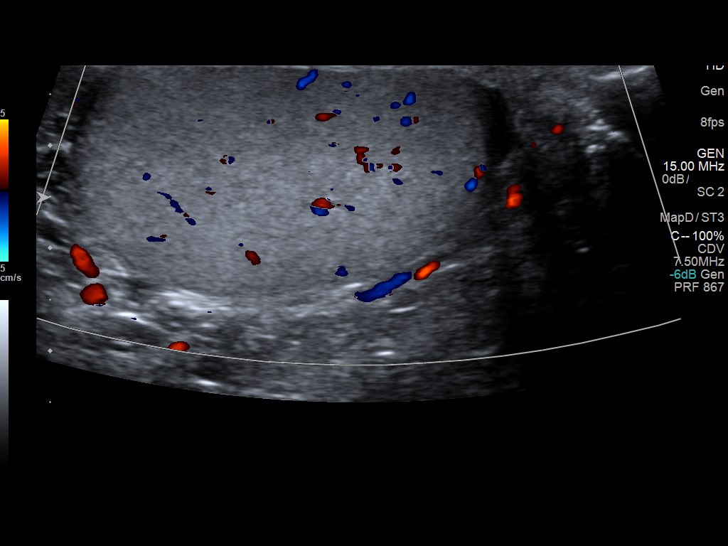
[im 11/66]
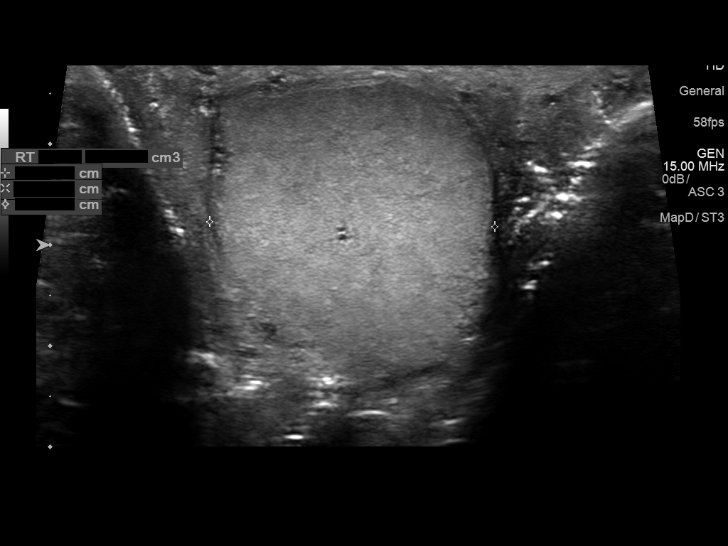
[im 17/66]
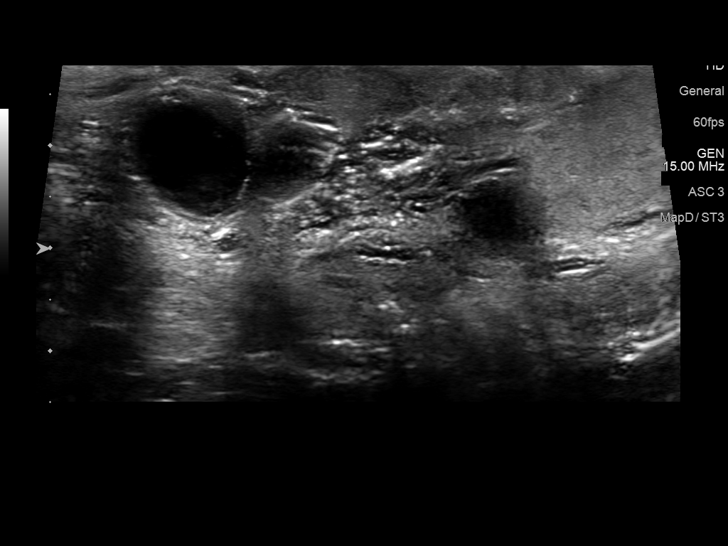
[im 22/66]
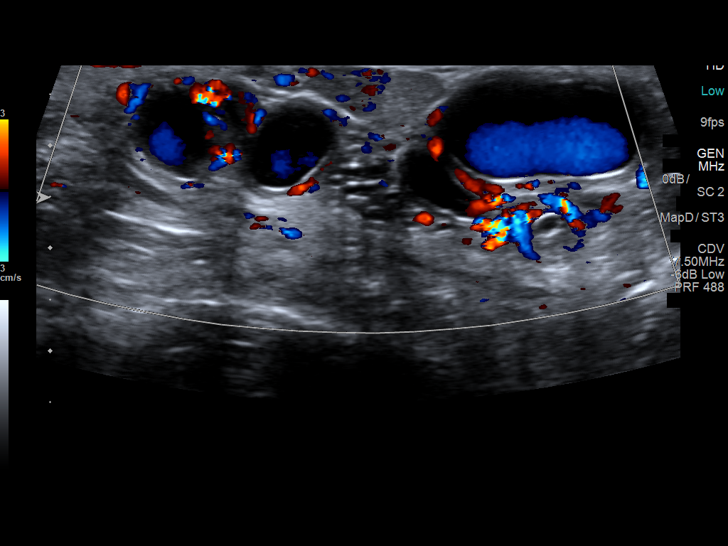
[im 28/66]
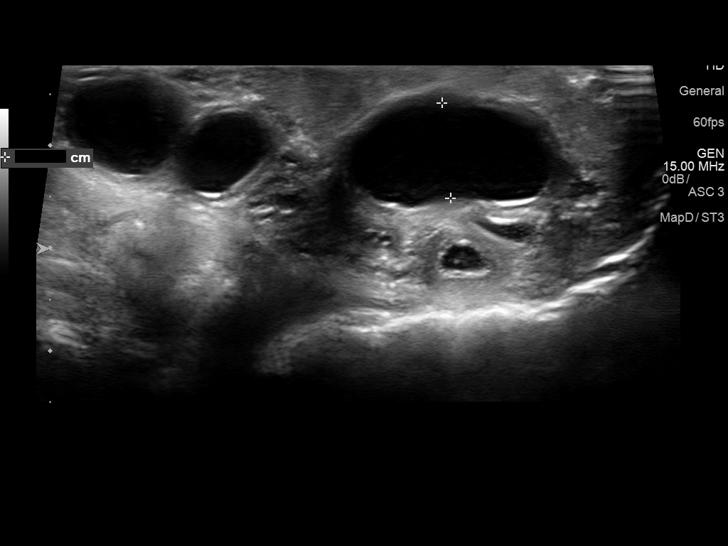
[im 33/66]
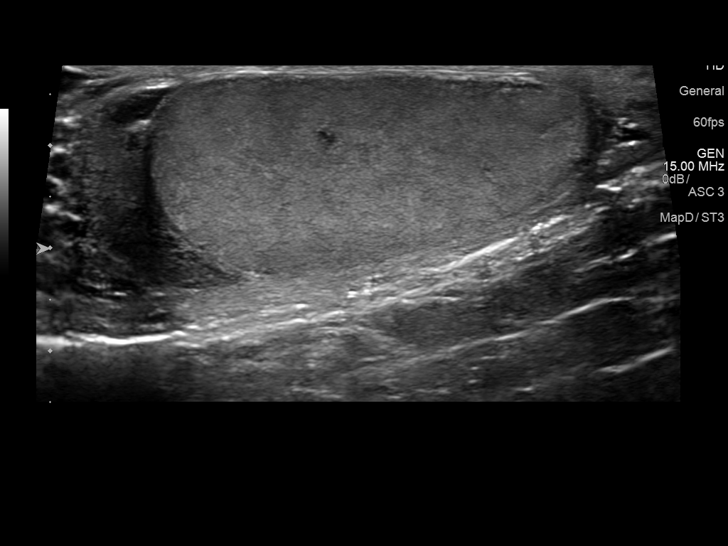
[im 38/66]
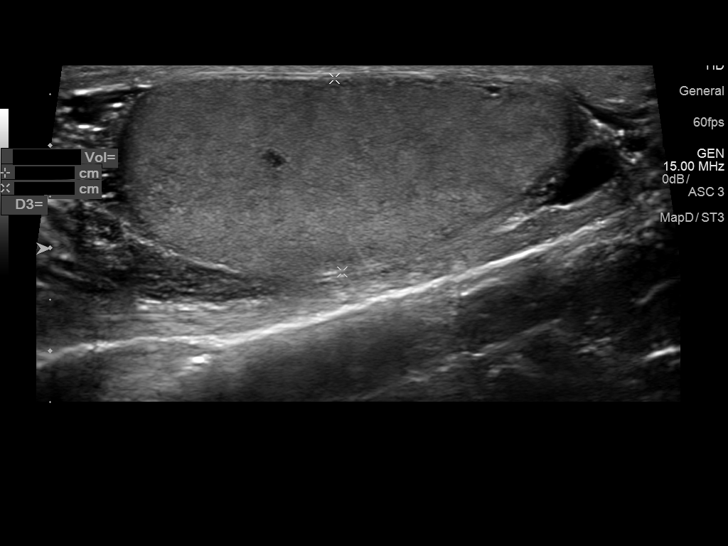
[im 44/66]
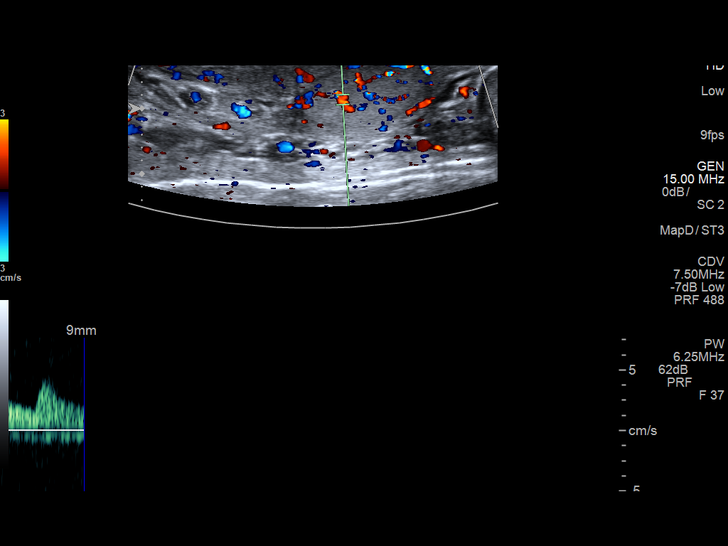
[im 49/66]
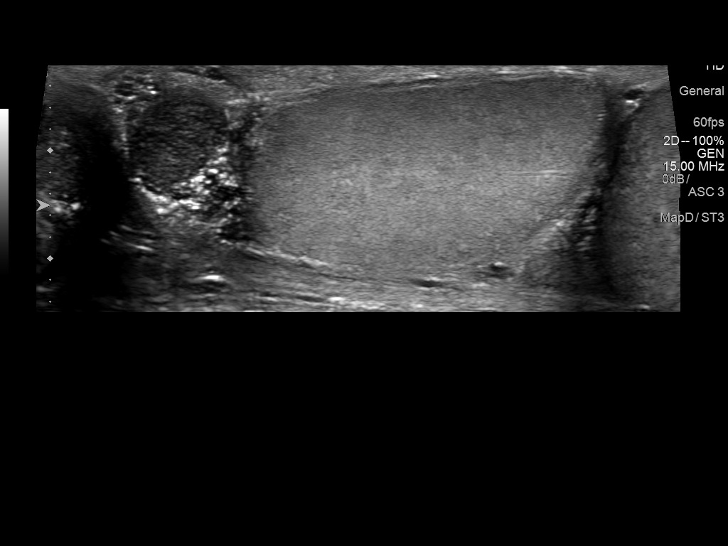
[im 55/66]
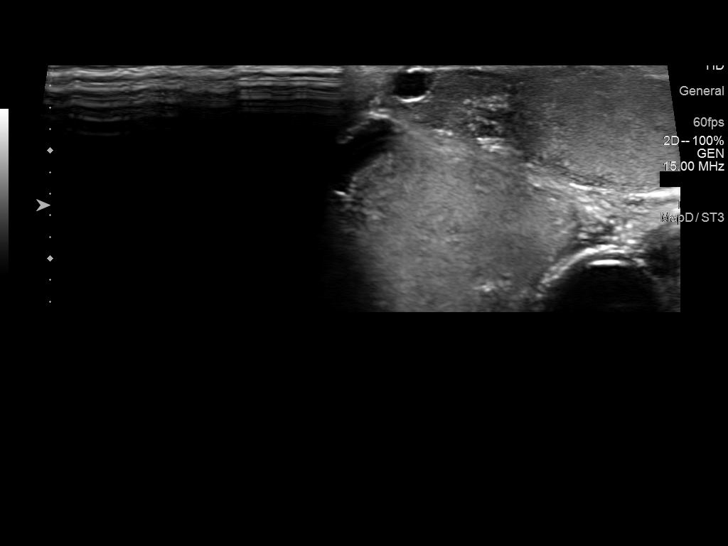
[im 60/66]
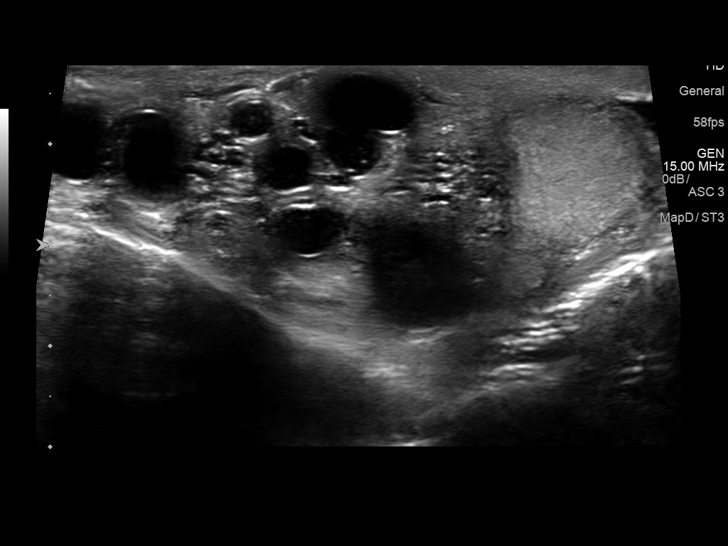
[im 66/66]
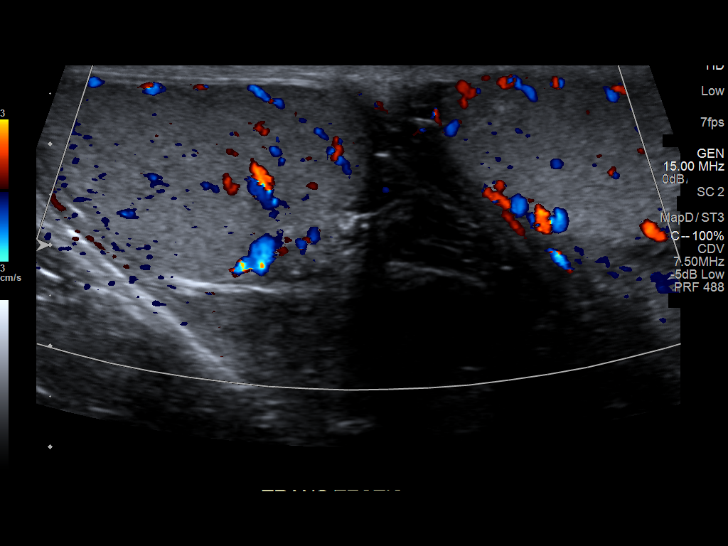

[13 of 25 positions shown; findings below may reference images not displayed]

FINDINGS: Right testicle

Measurements: 4.0 x 2.3 x 2.8 cm. No mass or microlithiasis
visualized.

Left testicle

Measurements: 4.3 x 1.9 x 3.2 cm. No mass or microlithiasis
visualized.

Right epididymis:  Normal in size and appearance.

Left epididymis:  6 mm cyst.

Hydrocele:  None visualized.

Varicocele: Large dilated tubular structures bilaterally. These
measure up to 10 mm bilaterally. These do not significantly change
with Valsalva. It is unclear if these represent large varicoceles or
dilated tubular structures, nonvascular.

Pulsed Doppler interrogation of both testes demonstrates normal low
resistance arterial and venous waveforms bilaterally.
IMPRESSION: Large bilateral dilated tubular structures measuring up to 10 mm.
These do not change with Valsalva. It is unclear if these represent
large varicoceles or nonvascular dilated tubular structures.

No testicular abnormality.

## 2018-11-11 MED FILL — VENTOLIN HFA 90 MCG INHALER: 108 (90 BAS | 18 days supply | Qty: 18 | Fill #1

## 2018-11-18 ENCOUNTER — Other Ambulatory Visit: Payer: Self-pay

## 2018-11-18 ENCOUNTER — Inpatient Hospital Stay (HOSPITAL_COMMUNITY)
Admission: EM | Admit: 2018-11-18 | Discharge: 2018-12-02 | DRG: 330 | Disposition: A | Discharge status: Home / Self Care | Payer: Medicare Other | Attending: Family Medicine | Admitting: Family Medicine

## 2018-11-18 ENCOUNTER — Encounter (HOSPITAL_COMMUNITY): Payer: Self-pay | Admitting: Emergency Medicine

## 2018-11-18 ENCOUNTER — Emergency Department (HOSPITAL_COMMUNITY): Payer: Medicare Other

## 2018-11-18 DIAGNOSIS — J45909 Unspecified asthma, uncomplicated: Secondary | ICD-10-CM

## 2018-11-18 DIAGNOSIS — Z4659 Encounter for fitting and adjustment of other gastrointestinal appliance and device: Secondary | ICD-10-CM

## 2018-11-18 DIAGNOSIS — R1011 Right upper quadrant pain: Secondary | ICD-10-CM

## 2018-11-18 DIAGNOSIS — Z20828 Contact with and (suspected) exposure to other viral communicable diseases: Secondary | ICD-10-CM | POA: Diagnosis present

## 2018-11-18 DIAGNOSIS — K9189 Other postprocedural complications and disorders of digestive system: Secondary | ICD-10-CM | POA: Diagnosis not present

## 2018-11-18 DIAGNOSIS — H913 Deaf nonspeaking, not elsewhere classified: Secondary | ICD-10-CM | POA: Diagnosis present

## 2018-11-18 DIAGNOSIS — I10 Essential (primary) hypertension: Secondary | ICD-10-CM | POA: Diagnosis present

## 2018-11-18 DIAGNOSIS — K560 Paralytic ileus: Secondary | ICD-10-CM | POA: Diagnosis not present

## 2018-11-18 DIAGNOSIS — J45902 Unspecified asthma with status asthmaticus: Secondary | ICD-10-CM | POA: Diagnosis not present

## 2018-11-18 DIAGNOSIS — E079 Disorder of thyroid, unspecified: Secondary | ICD-10-CM | POA: Diagnosis present

## 2018-11-18 DIAGNOSIS — Z7951 Long term (current) use of inhaled steroids: Secondary | ICD-10-CM

## 2018-11-18 DIAGNOSIS — R109 Unspecified abdominal pain: Secondary | ICD-10-CM | POA: Diagnosis present

## 2018-11-18 DIAGNOSIS — C189 Malignant neoplasm of colon, unspecified: Secondary | ICD-10-CM | POA: Diagnosis present

## 2018-11-18 DIAGNOSIS — R933 Abnormal findings on diagnostic imaging of other parts of digestive tract: Secondary | ICD-10-CM

## 2018-11-18 DIAGNOSIS — R350 Frequency of micturition: Secondary | ICD-10-CM | POA: Diagnosis present

## 2018-11-18 DIAGNOSIS — R1031 Right lower quadrant pain: Secondary | ICD-10-CM

## 2018-11-18 DIAGNOSIS — Z79899 Other long term (current) drug therapy: Secondary | ICD-10-CM

## 2018-11-18 DIAGNOSIS — E785 Hyperlipidemia, unspecified: Secondary | ICD-10-CM | POA: Diagnosis present

## 2018-11-18 DIAGNOSIS — K5730 Diverticulosis of large intestine without perforation or abscess without bleeding: Secondary | ICD-10-CM | POA: Diagnosis present

## 2018-11-18 DIAGNOSIS — L729 Follicular cyst of the skin and subcutaneous tissue, unspecified: Secondary | ICD-10-CM | POA: Diagnosis present

## 2018-11-18 DIAGNOSIS — Z880 Allergy status to penicillin: Secondary | ICD-10-CM

## 2018-11-18 DIAGNOSIS — K567 Ileus, unspecified: Secondary | ICD-10-CM

## 2018-11-18 DIAGNOSIS — Z87891 Personal history of nicotine dependence: Secondary | ICD-10-CM

## 2018-11-18 DIAGNOSIS — Z91018 Allergy to other foods: Secondary | ICD-10-CM

## 2018-11-18 DIAGNOSIS — C182 Malignant neoplasm of ascending colon: Secondary | ICD-10-CM | POA: Diagnosis not present

## 2018-11-18 DIAGNOSIS — R1084 Generalized abdominal pain: Secondary | ICD-10-CM | POA: Diagnosis not present

## 2018-11-18 DIAGNOSIS — R000 Tachycardia, unspecified: Secondary | ICD-10-CM | POA: Diagnosis not present

## 2018-11-18 DIAGNOSIS — Z7952 Long term (current) use of systemic steroids: Secondary | ICD-10-CM

## 2018-11-18 DIAGNOSIS — R52 Pain, unspecified: Secondary | ICD-10-CM | POA: Diagnosis not present

## 2018-11-18 DIAGNOSIS — Z8249 Family history of ischemic heart disease and other diseases of the circulatory system: Secondary | ICD-10-CM

## 2018-11-18 DIAGNOSIS — K648 Other hemorrhoids: Secondary | ICD-10-CM | POA: Diagnosis present

## 2018-11-18 DIAGNOSIS — M170 Bilateral primary osteoarthritis of knee: Secondary | ICD-10-CM | POA: Diagnosis present

## 2018-11-18 DIAGNOSIS — K219 Gastro-esophageal reflux disease without esophagitis: Secondary | ICD-10-CM | POA: Diagnosis present

## 2018-11-18 DIAGNOSIS — Z91012 Allergy to eggs: Secondary | ICD-10-CM

## 2018-11-18 DIAGNOSIS — R1010 Upper abdominal pain, unspecified: Secondary | ICD-10-CM | POA: Diagnosis not present

## 2018-11-18 LAB — COMPREHENSIVE METABOLIC PANEL
ALT: 20 U/L (ref 0–44)
AST: 20 U/L (ref 15–41)
Albumin: 4.1 g/dL (ref 3.5–5.0)
Alkaline Phosphatase: 42 U/L (ref 38–126)
Anion gap: 9 (ref 5–15)
BUN: 9 mg/dL (ref 6–20)
CO2: 25 mmol/L (ref 22–32)
Calcium: 9.1 mg/dL (ref 8.9–10.3)
Chloride: 107 mmol/L (ref 98–111)
Creatinine, Ser: 1.24 mg/dL (ref 0.61–1.24)
GFR calc Af Amer: >60 mL/min (ref >60)
GFR calc non Af Amer: >60 mL/min (ref >60)
Glucose, Bld: 107 mg/dL — ABNORMAL HIGH (ref 70–99)
Potassium: 3.8 mmol/L (ref 3.5–5.1)
Sodium: 141 mmol/L (ref 135–145)
Total Bilirubin: 0.3 mg/dL (ref 0.3–1.2)
Total Protein: 6.8 g/dL (ref 6.5–8.1)

## 2018-11-18 LAB — CBC
HCT: 42.5 % (ref 39.0–52.0)
Hemoglobin: 13.3 g/dL (ref 13.0–17.0)
MCH: 26.7 pg (ref 26.0–34.0)
MCHC: 31.3 g/dL (ref 30.0–36.0)
MCV: 85.2 fL (ref 80.0–100.0)
Platelets: 190 10*3/uL (ref 150–400)
RBC: 4.99 MIL/uL (ref 4.22–5.81)
RDW: 14.9 % (ref 11.5–15.5)
WBC: 5.1 10*3/uL (ref 4.0–10.5)
nRBC: 0 % (ref 0.0–0.2)

## 2018-11-18 LAB — HIV ANTIBODY (ROUTINE TESTING W REFLEX): HIV Screen 4th Generation wRfx: NONREACTIVE

## 2018-11-18 LAB — LIPASE, BLOOD: Lipase: 34 U/L (ref 11–51)

## 2018-11-18 LAB — SARS CORONAVIRUS 2 (TAT 6-24 HRS): SARS Coronavirus 2: NEGATIVE

## 2018-11-18 IMAGING — CT CT ABD-PELV W/ CM
2 of 5 series · 15 of 46 positions shown, 17 images · IV contrast (omnipaque)
Comparison: [DATE]

CLINICAL DATA: Right-sided abdominal pain with nausea

EXAM:
CT ABDOMEN AND PELVIS WITH CONTRAST
TECHNIQUE: Multidetector CT imaging of the abdomen and pelvis was performed
using the standard protocol following bolus administration of
intravenous contrast.
CONTRAST:  100mL OMNIPAQUE IOHEXOL 300 MG/ML  SOLN

[Series 3: abdomen 5.0 · axial · 0.80mm/px · z∈[+720,+1125]mm · 12 of 95 slices shown, 14 images]
[im 7/95  soft-tissue]
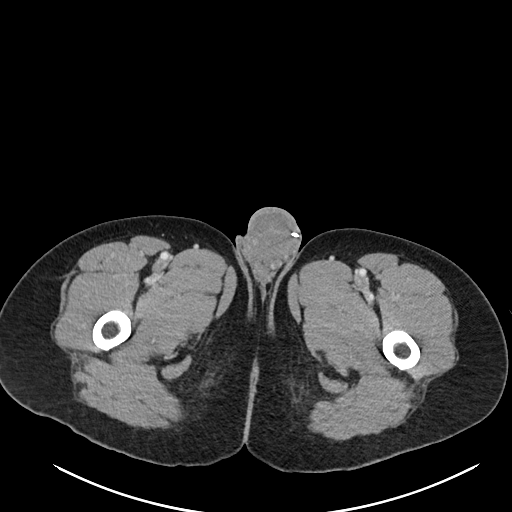
[im 7/95  bone]
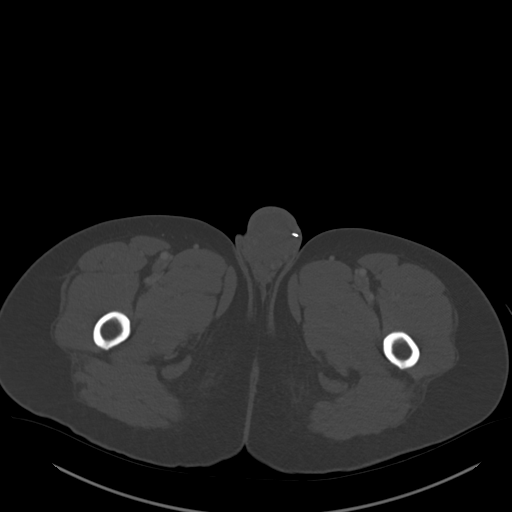
[im 13/95  soft-tissue]
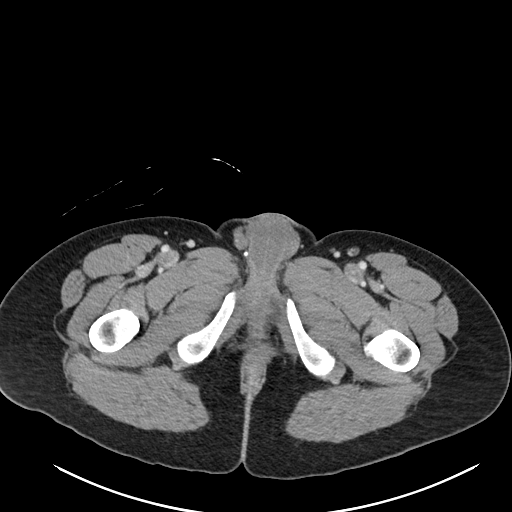
[im 19/95  soft-tissue]
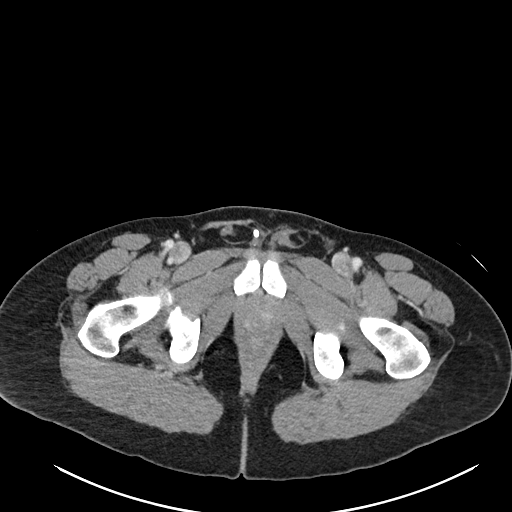
[im 32/95  soft-tissue]
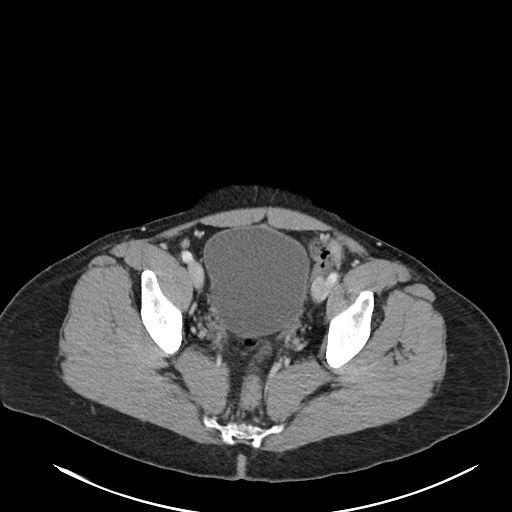
[im 38/95  soft-tissue]
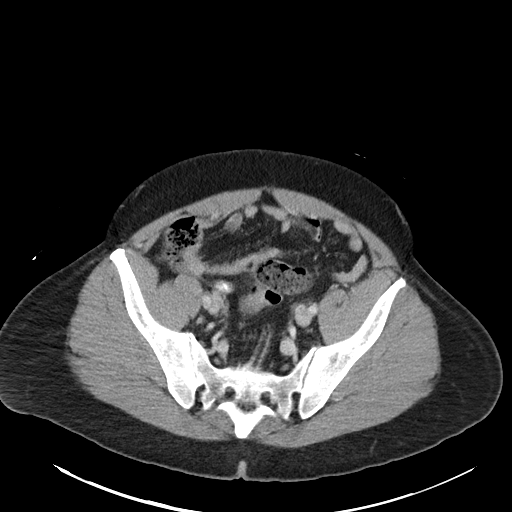
[im 44/95  soft-tissue]
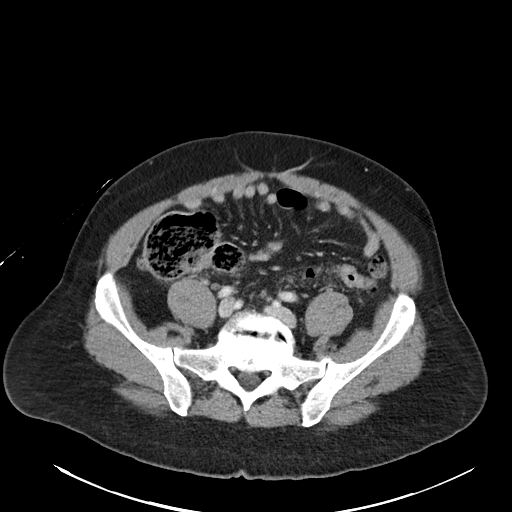
[im 51/95  soft-tissue]
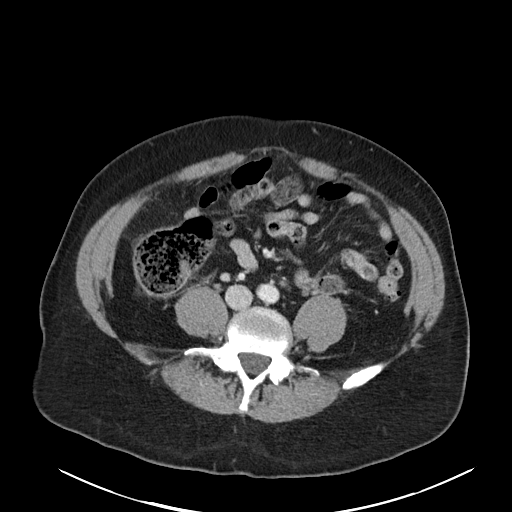
[im 57/95  soft-tissue]
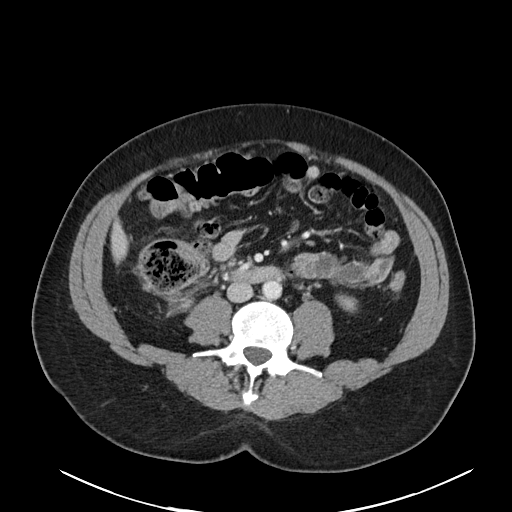
[im 63/95  soft-tissue]
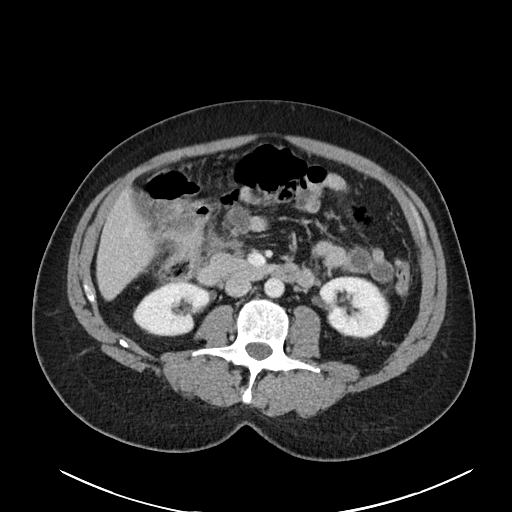
[im 63/95  bone]
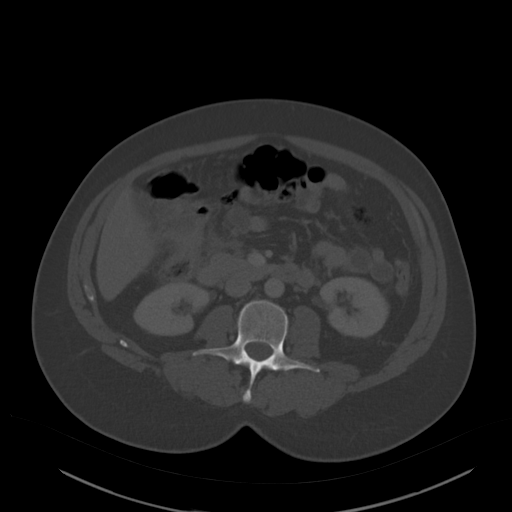
[im 76/95  soft-tissue]
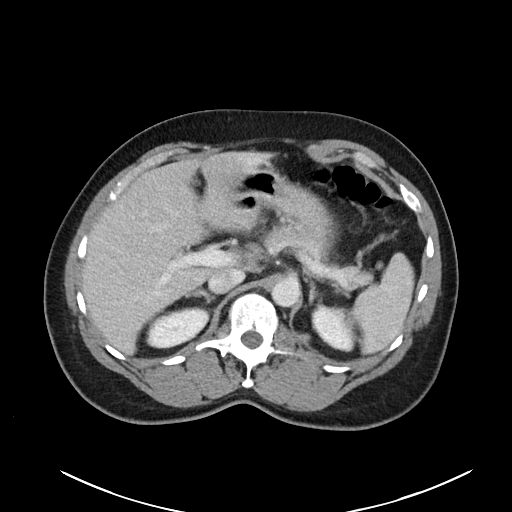
[im 82/95  soft-tissue]
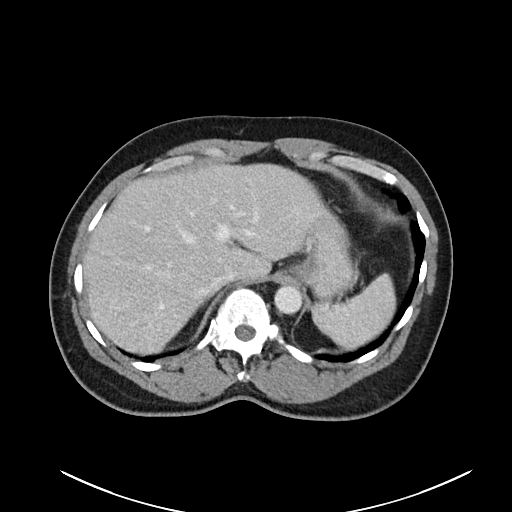
[im 88/95  soft-tissue]
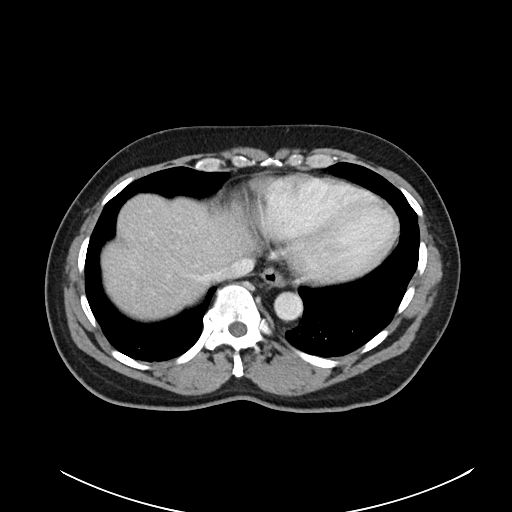

[Series 6: abdomen 3.0 mpr cor · coronal · 0.83mm/px · 3 of 104 slices shown]
[im 35/104  soft-tissue]
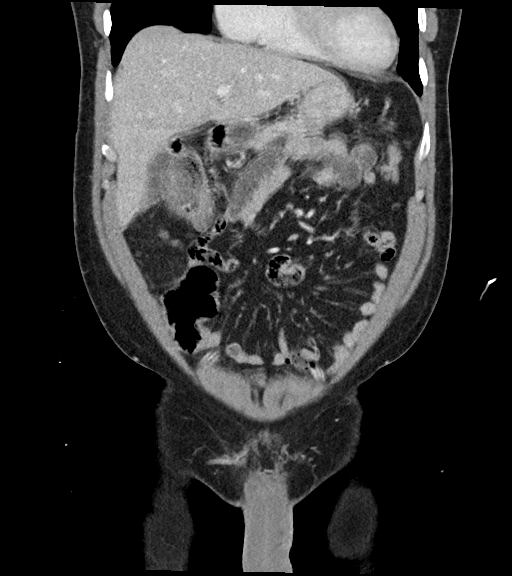
[im 46/104  soft-tissue]
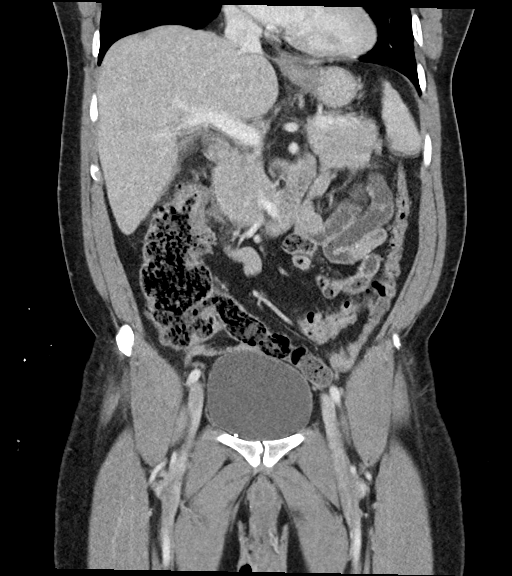
[im 58/104  soft-tissue]
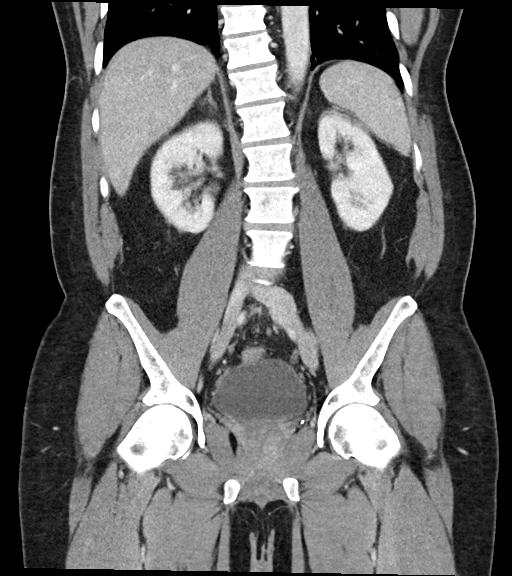

[15 of 46 positions shown; findings below may reference images not displayed]

FINDINGS: Lower chest: There is atelectatic change in the left base. The lung
bases otherwise are clear.

Hepatobiliary: No focal liver lesions are evident on this study.
Gallbladder wall is not appreciably thickened. There is no biliary
duct dilatation.

Pancreas: There is no evident pancreatic mass or inflammatory focus.

Spleen: No splenic lesions are appreciable.

Adrenals/Urinary Tract: Adrenals bilaterally appear unremarkable.
Kidneys bilaterally show no evident mass or hydronephrosis on either
side. There is no evident renal or ureteral calculus on either side.
Urinary bladder is midline with wall thickness within normal limits.

Stomach/Bowel: There is wall thickening in the mid ascending colon
region, extending over a distance of slightly greater than 5 cm.
Proximal to this area of wall thickening in the ascending colon,
there is mild distension of the cecum and proximal ascending colon
with stool. Stool is also noted in the distal ileum. There is no
distal ileal inflammation or fistula. There is no frank bowel
obstruction. There is air in the colon distal to the area of wall
thickening in the ascending colon. There are scattered colonic
diverticula without diverticulitis evident. No free air or portal
venous air appreciable.

Vascular/Lymphatic: No abdominal aortic aneurysm. There are
scattered foci of calcification in the aorta and common iliac
arteries. No adenopathy is evident in the abdomen or pelvis.

Reproductive: Prostate and seminal vesicles are normal in size and
contour. No evident pelvic mass.

Other: The appendix appears unremarkable. No periappendiceal region
inflammatory changes evident. No abscess or ascites is evident in
the abdomen or pelvis.

Musculoskeletal: There is degenerative change in the lumbar spine
with vacuum phenomenon at L5-S1. No blastic or lytic bone lesions
are evident. There is no intramuscular or abdominal wall lesion.
IMPRESSION: 1. Wall thickening throughout much of the mid and mid to distal
ascending colon. Concern for underlying neoplasm in the right colon.
Note that there is mild distention of the proximal ascending colon
and cecum, proximal to this area of wall thickening in the mid
ascending colon. Stool also is noted in the terminal ileum, likely
due to relative obstruction from the apparent lesion in the
ascending colon. No small bowel dilatation noted to indicate small
bowel obstruction. This abnormality in the ascending colon warrants
direct colonic visualization to assess for neoplasm. Note that no
appreciable diverticular disease is noted in this portion of the
colon.

2.  No abscess in the abdomen or pelvis.  Appendix appears normal.

3. No renal or ureteral calculus. No hydronephrosis. Urinary bladder
wall thickness normal.

4.  Aortic Atherosclerosis ([FA]-[FA]).

## 2018-11-18 MED ORDER — IOHEXOL 300 MG/ML  SOLN
100.0000 mL | Freq: Once | INTRAMUSCULAR | Status: AC | PRN
  Administered 2018-11-18: 100 mL via INTRAVENOUS

## 2018-11-18 MED ORDER — PEG-KCL-NACL-NASULF-NA ASC-C 100 G PO SOLR
0.5000 | Freq: Once | ORAL | Status: AC
  Administered 2018-11-19: 100 g via ORAL
  Filled 2018-11-18: qty 1

## 2018-11-18 MED ORDER — ONDANSETRON 4 MG PO TBDP
4.0000 mg | ORAL_TABLET | Freq: Once | ORAL | Status: AC | PRN
  Administered 2018-11-18: 05:00:00 4 mg via ORAL
  Filled 2018-11-18: qty 1

## 2018-11-18 MED ORDER — ALBUTEROL SULFATE (2.5 MG/3ML) 0.083% IN NEBU
2.5000 mg | INHALATION_SOLUTION | RESPIRATORY_TRACT | Status: DC | PRN
  Administered 2018-11-19: 2.5 mg via RESPIRATORY_TRACT

## 2018-11-18 MED ORDER — PREDNISONE 20 MG PO TABS
50.0000 mg | ORAL_TABLET | Freq: Every day | ORAL | Status: DC
  Administered 2018-11-20: 50 mg via ORAL
  Filled 2018-11-18 (×2): qty 2

## 2018-11-18 MED ORDER — DEXTROSE-NACL 5-0.45 % IV SOLN
INTRAVENOUS | Status: DC
  Administered 2018-11-18: 22:00:00 via INTRAVENOUS

## 2018-11-18 MED ORDER — PEG-KCL-NACL-NASULF-NA ASC-C 100 G PO SOLR
0.5000 | Freq: Once | ORAL | Status: AC
  Administered 2018-11-18: 22:00:00 100 g via ORAL
  Filled 2018-11-18 (×2): qty 1

## 2018-11-18 MED ORDER — SODIUM CHLORIDE 0.9% FLUSH: 3.0000 mL | Freq: Once | INTRAVENOUS | Status: DC

## 2018-11-18 MED ORDER — MORPHINE SULFATE (PF) 2 MG/ML IV SOLN
2.0000 mg | Freq: Once | INTRAVENOUS | Status: AC
  Administered 2018-11-18: 2 mg via INTRAVENOUS
  Filled 2018-11-18: qty 1

## 2018-11-18 MED ORDER — PEG-KCL-NACL-NASULF-NA ASC-C 100 G PO SOLR: 1.0000 | Freq: Once | ORAL | Status: DC

## 2018-11-18 MED ORDER — ENOXAPARIN SODIUM 40 MG/0.4ML ~~LOC~~ SOLN: 40.0000 mg | SUBCUTANEOUS | Status: DC

## 2018-11-18 MED ORDER — MONTELUKAST SODIUM 10 MG PO TABS
10.0000 mg | ORAL_TABLET | Freq: Every day | ORAL | Status: DC
  Administered 2018-11-18 – 2018-12-01 (×11): 10 mg via ORAL
  Filled 2018-11-18 (×12): qty 1

## 2018-11-18 MED ORDER — ALBUTEROL SULFATE (2.5 MG/3ML) 0.083% IN NEBU: 2.5000 mg | INHALATION_SOLUTION | Freq: Four times a day (QID) | RESPIRATORY_TRACT | Status: DC | PRN

## 2018-11-18 MED ORDER — ALBUTEROL SULFATE HFA 108 (90 BASE) MCG/ACT IN AERS: 2.0000 | INHALATION_SPRAY | RESPIRATORY_TRACT | Status: DC | PRN

## 2018-11-18 MED ORDER — MORPHINE SULFATE (PF) 4 MG/ML IV SOLN
4.0000 mg | Freq: Once | INTRAVENOUS | Status: AC
  Administered 2018-11-18: 10:00:00 4 mg via INTRAVENOUS
  Filled 2018-11-18: qty 1

## 2018-11-18 MED ORDER — ONDANSETRON HCL 4 MG PO TABS: 4.0000 mg | ORAL_TABLET | Freq: Four times a day (QID) | ORAL | Status: DC | PRN

## 2018-11-18 MED ORDER — LORATADINE 10 MG PO TABS
10.0000 mg | ORAL_TABLET | Freq: Every day | ORAL | Status: DC
  Administered 2018-11-18 – 2018-12-02 (×10): 10 mg via ORAL
  Filled 2018-11-18 (×12): qty 1

## 2018-11-18 MED ORDER — ONDANSETRON HCL 4 MG/2ML IJ SOLN: 4.0000 mg | Freq: Four times a day (QID) | INTRAMUSCULAR | Status: DC | PRN

## 2018-11-18 MED ORDER — ENOXAPARIN SODIUM 30 MG/0.3ML ~~LOC~~ SOLN: 30.0000 mg | SUBCUTANEOUS | Status: DC

## 2018-11-18 MED ORDER — FLUTICASONE FUROATE-VILANTEROL 200-25 MCG/INH IN AEPB
1.0000 | INHALATION_SPRAY | Freq: Every day | RESPIRATORY_TRACT | Status: DC
  Administered 2018-11-20 – 2018-12-02 (×12): 1 via RESPIRATORY_TRACT
  Filled 2018-11-18 (×2): qty 28

## 2018-11-18 MED ORDER — SODIUM CHLORIDE 0.9 % IV BOLUS
1000.0000 mL | Freq: Once | INTRAVENOUS | Status: AC
  Administered 2018-11-18: 10:00:00 1000 mL via INTRAVENOUS

## 2018-11-18 NOTE — ED Notes (Signed)
Pt is deaf 

## 2018-11-18 NOTE — ED Triage Notes (Signed)
Pt BIB [facility], pt c/o abdominal pain and feeling "hot". EMS VSS.

## 2018-11-18 NOTE — H&P (Signed)
[facility]                                                                                    Patient Demographics  Alexander Reeves, is a 53 y.o. male  CSN: [id number]  MRN: [id number]  DOB - 1965/08/31  Admit Date - 11/18/2018  Outpatient Primary MD for the patient is [name], NP   With History of -  Past Medical History:  Diagnosis Date  . Arthritis    KNEES  . Asthma   . Deaf   . GERD (gastroesophageal reflux disease)    OCC  . Scrotal cyst   . Thyroid disease   . Urinary frequency    OCC      Past Surgical History:  Procedure Laterality Date  . BLADDER SURGERY  YRS AGO  . EPIDIDYMECTOMY Right 03/22/2018   Procedure: SCROTAL EXPLORATION RIGHT  PARTIAL EPIDIDYMECTOMY;  Surgeon: [name], MD;  Location: [facility];  Service: Urology;  Laterality: Right;  . HEMORRHOID SURGERY  WHEN YOUNG  . NO PAST SURGERIES    . VASECTOMY  YRS AGO    in for   Chief Complaint  Patient presents with  . Abdominal Pain     HPI  Alexander Reeves  is a 53 y.o. male, with past medical history significant for asthma, presenting to the emergency room with the 1 day history of right lower quadrant abdominal pain with associated nausea, no diarrhea no vomiting.  The patient presented few weeks ago to the ER with the same sy,ptoms that improved with steroids . Pt denies weight loss or blood in his stools . Denies cough, contact with COVID-19 patients, chest pain or shortness of breath. In the emergency room blood work showed elevated creatinine at 1.24, otherwise negative.  CT of the abdomen showed mid and mid to distal ascending colon wall thickening concerning for neoplasm of the right colon. Gastroenterology and surgery were both consulted and a colonoscopy scheduled for a.m. Patient is hard of hearing and you can communicate with him with sign language only  Review of Systems    In addition to the HPI above, No Fever-chills, No  Headache, No changes with Vision or hearing, No problems swallowing food or Liquids, No Chest pain, Cough or Shortness of Breath, No  Vommitting, Bowel movements are regular, No Blood in stool or Urine, No dysuria, No new skin rashes or bruises, No new joints pains-aches,  No new weakness, tingling, numbness in any extremity, No recent weight gain or loss, No polyuria, polydypsia or polyphagia, No significant Mental Stressors.  A full 10 point Review of Systems was done, except as stated above, all other Review of Systems were negative.   Social History Social History   Tobacco Use  . Smoking status: Former Smoker    Types: Cigarettes    Quit date: 06/24/2016    Years since quitting: 2.4  . Smokeless tobacco: Never Used  Substance Use Topics  . Alcohol use: Yes    Comment: OCC     Family History Family History  Problem Relation Age of Onset  . Heart disease Father  Prior to Admission medications   Medication Sig Start Date End Date Taking? Authorizing Provider  albuterol (PROVENTIL) (2.5 MG/3ML) 0.083% nebulizer solution Take 3 mLs (2.5 mg total) by nebulization every 6 (six) hours as needed for wheezing or shortness of breath. 01/28/18 01/28/19 Yes Elsie Stain, MD  albuterol (VENTOLIN HFA) 108 (90 Base) MCG/ACT inhaler Inhale 2 puffs into the lungs every 4 (four) hours as needed for wheezing or shortness of breath. 10/11/18  Yes [name], NP  fenofibrate (TRICOR) 145 MG tablet Take 1 tablet (145 mg total) by mouth daily. 10/12/18  Yes [name], NP  loratadine (CLARITIN) 10 MG tablet Take 1 tablet (10 mg total) by mouth daily. 10/11/18  Yes [name], NP  montelukast (SINGULAIR) 10 MG tablet Take 1 tablet (10 mg total) by mouth at bedtime. 10/11/18  Yes [name], NP  predniSONE (DELTASONE) 50 MG tablet 1 tablet PO daily Patient taking differently: Take 50 mg by mouth daily.  10/17/18  Yes Rancour, Annie Main, MD  fluticasone  furoate-vilanterol (BREO ELLIPTA) 200-25 MCG/INH AEPB Inhale 1 puff into the lungs daily. 10/11/18   [name], NP    Allergies  Allergen Reactions  . Cheese Shortness Of Breath and Swelling    Swelling of the throat  . Eggs Or Egg-Derived Products Shortness Of Breath and Swelling    Swelling of the throat  . Penicillins Anaphylaxis    Has patient had a PCN reaction causing immediate rash, facial/tongue/throat swelling, SOB or lightheadedness with hypotension: Yes Has patient had a PCN reaction causing severe rash involving mucus membranes or skin necrosis: Unknown Has patient had a PCN reaction that required hospitalization: Unknown Has patient had a PCN reaction occurring within the last 10 years: Unknown If all of the above answers are "NO", then may proceed with Cephalosporin use.     Physical Exam  Vitals  Blood pressure 133/87, pulse 75, temperature 98.3 F (36.8 C), temperature source Oral, resp. rate 16, SpO2 97 %.   General appearance well-developed, well-nourished, no acute distress HEENT no jaundice or pallor, no facial deviation oral thrush Neck supple, malignant distention Chest clear and resonant Heart normal S1-S2, no murmurs gallops or rubs Abdomen soft mild right lower quadrant tenderness Neuro gross nonfocal patient moving all extremities Skin no rashes or ulcers  Data Review  CBC Recent Labs  Lab 11/18/18 0430  WBC 5.1  HGB 13.3  HCT 42.5  PLT 190  MCV 85.2  MCH 26.7  MCHC 31.3  RDW 14.9   ------------------------------------------------------------------------------------------------------------------  Chemistries  Recent Labs  Lab 11/18/18 0430  NA 141  K 3.8  CL 107  CO2 25  GLUCOSE 107*  BUN 9  CREATININE 1.24  CALCIUM 9.1  AST 20  ALT 20  ALKPHOS 42  BILITOT 0.3   ------------------------------------------------------------------------------------------------------------------ CrCl cannot be calculated (Unknown  ideal weight.). ------------------------------------------------------------------------------------------------------------------ No results for input(s): TSH, T4TOTAL, T3FREE, THYROIDAB in the last 72 hours.  Invalid input(s): FREET3   Coagulation profile No results for input(s): INR, PROTIME in the last 168 hours. ------------------------------------------------------------------------------------------------------------------- No results for input(s): DDIMER in the last 72 hours. -------------------------------------------------------------------------------------------------------------------  Cardiac Enzymes No results for input(s): CKMB, TROPONINI, MYOGLOBIN in the last 168 hours.  Invalid input(s): CK ------------------------------------------------------------------------------------------------------------------ Invalid input(s): POCBNP   ---------------------------------------------------------------------------------------------------------------  Urinalysis No results found for: COLORURINE, APPEARANCEUR, LABSPEC, PHURINE, GLUCOSEU, HGBUR, BILIRUBINUR, KETONESUR, PROTEINUR, UROBILINOGEN, NITRITE, LEUKOCYTESUR  ----------------------------------------------------------------------------------------------------------------     Imaging results:   Ct Abdomen Pelvis W Contrast  Result Date:  11/18/2018 CLINICAL DATA:  Right-sided abdominal pain with nausea EXAM: CT ABDOMEN AND PELVIS WITH CONTRAST TECHNIQUE: Multidetector CT imaging of the abdomen and pelvis was performed using the standard protocol following bolus administration of intravenous contrast. CONTRAST:  130mL OMNIPAQUE IOHEXOL 300 MG/ML  SOLN COMPARISON:  October 17, 2018 FINDINGS: Lower chest: There is atelectatic change in the left base. The lung bases otherwise are clear. Hepatobiliary: No focal liver lesions are evident on this study. Gallbladder wall is not appreciably thickened. There is no biliary duct  dilatation. Pancreas: There is no evident pancreatic mass or inflammatory focus. Spleen: No splenic lesions are appreciable. Adrenals/Urinary Tract: Adrenals bilaterally appear unremarkable. Kidneys bilaterally show no evident mass or hydronephrosis on either side. There is no evident renal or ureteral calculus on either side. Urinary bladder is midline with wall thickness within normal limits. Stomach/Bowel: There is wall thickening in the mid ascending colon region, extending over a distance of slightly greater than 5 cm. Proximal to this area of wall thickening in the ascending colon, there is mild distension of the cecum and proximal ascending colon with stool. Stool is also noted in the distal ileum. There is no distal ileal inflammation or fistula. There is no frank bowel obstruction. There is air in the colon distal to the area of wall thickening in the ascending colon. There are scattered colonic diverticula without diverticulitis evident. No free air or portal venous air appreciable. Vascular/Lymphatic: No abdominal aortic aneurysm. There are scattered foci of calcification in the aorta and common iliac arteries. No adenopathy is evident in the abdomen or pelvis. Reproductive: Prostate and seminal vesicles are normal in size and contour. No evident pelvic mass. Other: The appendix appears unremarkable. No periappendiceal region inflammatory changes evident. No abscess or ascites is evident in the abdomen or pelvis. Musculoskeletal: There is degenerative change in the lumbar spine with vacuum phenomenon at L5-S1. No blastic or lytic bone lesions are evident. There is no intramuscular or abdominal wall lesion. IMPRESSION: 1. Wall thickening throughout much of the mid and mid to distal ascending colon. Concern for underlying neoplasm in the right colon. Note that there is mild distention of the proximal ascending colon and cecum, proximal to this area of wall thickening in the mid ascending colon. Stool also  is noted in the terminal ileum, likely due to relative obstruction from the apparent lesion in the ascending colon. No small bowel dilatation noted to indicate small bowel obstruction. This abnormality in the ascending colon warrants direct colonic visualization to assess for neoplasm. Note that no appreciable diverticular disease is noted in this portion of the colon. 2.  No abscess in the abdomen or pelvis.  Appendix appears normal. 3. No renal or ureteral calculus. No hydronephrosis. Urinary bladder wall thickness normal. 4.  Aortic Atherosclerosis (ICD10-I70.0). Electronically Signed   By: Lowella Grip III M.D.   On: 11/18/2018 11:36      Assessment & Plan  Abdominal pain CT of abdomen showing thickening in the ascending colon For colonoscopy by GI in a.m. N.p.o. after midnight Surgery on consult  Asthma Continue with nebs  Severe hard of hearing, needs sign language interpreter        DVT Prophylaxis Lovenox  AM Labs Ordered, also please review Full Orders  Family Communication: Discussed with family at bedside  Code Status full  Disposition Plan: Home  Time spent in minutes : 40 minutes  Condition GUARDED   @SIGNATURE @

## 2018-11-18 NOTE — ED Notes (Signed)
Admitting Provider at bedside. 

## 2018-11-18 NOTE — Consult Note (Addendum)
Alexander Pelt Sr. [birth year]  [id number].    Requesting MD: Janetta Hora, PA-C Chief Complaint: RLQ abdominal pain Reason for Consult: Wall thickening of ascending colon that is concerning for underlying neoplasm   HPI: Alexander Reeves. is a 53 y.o. male who presented to [facility] with right lower quadrant abdominal pain, nausea and loose stools.  Patient reports he awoke at 2 AM this morning with severe,  cramping, right lower quadrant abdominal pain that has been waxing and waning since initial onset.  He reports associated nausea and one loose non-bloody, non-melanous stools. He is unsure if he is passing flatus. He received morphine and Zofran upon arrival to the ED that has nearly resolved his pain.  He underwent work-up that showed wall thickening throughout much of the mid and mid to distal ascending colon concerning for underlying neoplasm in the right colon. There was also noted to be mild distention of the proximal ascending colon and cecum, proximal to this area of wall thickening in the mid ascending colon with stool noted the terminal ileum, that was felt to likely be due to relative obstruction from the apparent lesion in the ascending colon. General surgery was asked to see.  Patient denies history of similar symptoms in the past. He did have some mild generalized abdominal pain when he was seen in the ED on 10/4 for respiratory distress and abdominal pain. Per EDP note, during 10/4 presentation, the patient was "had the sudden urge to have a bowel movement and had some abdominal pain [...] got very hot and flushed prior to the onset of his shortness of breath. He underwent CTA of chest/abdomen and pelvis during that time that did not show any evidence of bowel wall thickening. This episode was felt to be 2/2 asthma and was tx with steroids. He is rather concerned during hx taking if he should still be on steroids for this prior excerebration. No other episodes of abdominal pain  prior to this morning and 1 month ago reported. He reports that he has been dealing with some constipation since being seen in the hospital on 10/4, but before then, his stools were normal. He has not been trying anything at home for this. He did have an episode of loose stools yesterday. He is unsure of any weight loss. He has been eating like normal prior to onset. No PO intake since onset of symptoms. He reports only family hx of cancer was his grandfather and this was lung cancer. No personal or family hx of colon cancer or IBD. The patient has never had a colonoscopy. He does have a PCP, Juluis Mire, NP. He reports that he smoke cigarettes for several years prior to when he quit 1 month ago. He lives at home with his wife and children. He reports he had a surgery on his bladder when he was 11. He also has had a hemorrhoidectomy, vasectomy and epididymectomy. No other surgeries.   Hx was obtained using a sign language interpreter.   ROS: Review of Systems  Constitutional: Negative for chills and fever.  Respiratory: Negative for cough and shortness of breath.   Cardiovascular: Negative for chest pain.  Gastrointestinal: Positive for constipation, diarrhea and nausea. Negative for abdominal pain, blood in stool, melena and vomiting.  Genitourinary: Negative for dysuria and urgency.  All other systems reviewed and are negative.   Family History  Problem Relation Age of Onset   Heart disease Father     Past Medical  History:  Diagnosis Date   Arthritis    KNEES   Asthma    Deaf    GERD (gastroesophageal reflux disease)    OCC   Scrotal cyst    Thyroid disease    Urinary frequency    OCC    Past Surgical History:  Procedure Laterality Date   BLADDER SURGERY  YRS AGO   EPIDIDYMECTOMY Right 03/22/2018   Procedure: SCROTAL EXPLORATION RIGHT  PARTIAL EPIDIDYMECTOMY;  Surgeon: [name], MD;  Location: [facility];  Service: Urology;  Laterality:  Right;   HEMORRHOID SURGERY  WHEN YOUNG   NO PAST SURGERIES     VASECTOMY  YRS AGO    Social History:  reports that he quit smoking about 2 years ago. His smoking use included cigarettes. He has never used smokeless tobacco. He reports current alcohol use. He reports current drug use. Drug: Marijuana.  Allergies:  Allergies  Allergen Reactions   Cheese Shortness Of Breath and Swelling    Swelling of the throat   Eggs Or Egg-Derived Products Shortness Of Breath and Swelling    Swelling of the throat   Penicillins Anaphylaxis    Has patient had a PCN reaction causing immediate rash, facial/tongue/throat swelling, SOB or lightheadedness with hypotension: Yes Has patient had a PCN reaction causing severe rash involving mucus membranes or skin necrosis: Unknown Has patient had a PCN reaction that required hospitalization: Unknown Has patient had a PCN reaction occurring within the last 10 years: Unknown If all of the above answers are "NO", then may proceed with Cephalosporin use.     (Not in a hospital admission)    Physical Exam: Blood pressure (!) 131/91, pulse 72, temperature 98.3 F (36.8 C), temperature source Oral, resp. rate 16, SpO2 100 %. General: pleasant, WD, WN male who is laying in bed in NAD HEENT: head is normocephalic, atraumatic.  Sclera are noninjected.  PERRL.  Ears and nose without any masses or lesions.   Wearing a mask.  Heart: regular, rate, and rhythm.  Normal s1,s2. No obvious murmurs, gallops, or rubs noted.  Palpable radial and pedal pulses bilaterally Lungs: CTAB, no wheezes, rhonchi, or rales noted.  Respiratory effort nonlabored Abd: Soft, mildly distended, very mild tenderness of the RLQ, +BS, no palpable masses, hernias, or organomegaly. No prior surgical scars.  MS: all 4 extremities are symmetrical with no cyanosis, clubbing, or edema. Skin: warm and dry with no masses, lesions, or rashes Psych: Alert with an appropriate affect. Neuro:  A&Ox3  Moves all extremities.   Results for orders placed or performed during the hospital encounter of 11/18/18 (from the past 48 hour(s))  Lipase, blood     Status: None   Collection Time: 11/18/18  4:30 AM  Result Value Ref Range   Lipase 34 11 - 51 U/L    Comment: Performed at [facility], [address] [place], [place] [address]  Comprehensive metabolic panel     Status: Abnormal   Collection Time: 11/18/18  4:30 AM  Result Value Ref Range   Sodium 141 135 - 145 mmol/L   Potassium 3.8 3.5 - 5.1 mmol/L   Chloride 107 98 - 111 mmol/L   CO2 25 22 - 32 mmol/L   Glucose, Bld 107 (H) 70 - 99 mg/dL   BUN 9 6 - 20 mg/dL   Creatinine, Ser 1.24 0.61 - 1.24 mg/dL   Calcium 9.1 8.9 - 10.3 mg/dL   Total Protein 6.8 6.5 - 8.1  g/dL   Albumin 4.1 3.5 - 5.0 g/dL   AST 20 15 - 41 U/L   ALT 20 0 - 44 U/L   Alkaline Phosphatase 42 38 - 126 U/L   Total Bilirubin 0.3 0.3 - 1.2 mg/dL   GFR calc non Af Amer >60 >60 mL/min   GFR calc Af Amer >60 >60 mL/min   Anion gap 9 5 - 15    Comment: Performed at [facility] [address], [place] [address]  CBC     Status: None   Collection Time: 11/18/18  4:30 AM  Result Value Ref Range   WBC 5.1 4.0 - 10.5 K/uL   RBC 4.99 4.22 - 5.81 MIL/uL   Hemoglobin 13.3 13.0 - 17.0 g/dL   HCT 42.5 39.0 - 52.0 %   MCV 85.2 80.0 - 100.0 fL   MCH 26.7 26.0 - 34.0 pg   MCHC 31.3 30.0 - 36.0 g/dL   RDW 14.9 11.5 - 15.5 %   Platelets 190 150 - 400 K/uL   nRBC 0.0 0.0 - 0.2 %    Comment: Performed at [facility], [place] [address] [place],  [address]   Ct Abdomen Pelvis W Contrast  Result Date: 11/18/2018 CLINICAL DATA:  Right-sided abdominal pain with nausea EXAM: CT ABDOMEN AND PELVIS WITH CONTRAST TECHNIQUE: Multidetector CT imaging of the abdomen and pelvis was performed using the standard protocol following bolus administration of intravenous contrast. CONTRAST:  168mL OMNIPAQUE IOHEXOL 300 MG/ML  SOLN COMPARISON:  October 17, 2018  FINDINGS: Lower chest: There is atelectatic change in the left base. The lung bases otherwise are clear. Hepatobiliary: No focal liver lesions are evident on this study. Gallbladder wall is not appreciably thickened. There is no biliary duct dilatation. Pancreas: There is no evident pancreatic mass or inflammatory focus. Spleen: No splenic lesions are appreciable. Adrenals/Urinary Tract: Adrenals bilaterally appear unremarkable. Kidneys bilaterally show no evident mass or hydronephrosis on either side. There is no evident renal or ureteral calculus on either side. Urinary bladder is midline with wall thickness within normal limits. Stomach/Bowel: There is wall thickening in the mid ascending colon region, extending over a distance of slightly greater than 5 cm. Proximal to this area of wall thickening in the ascending colon, there is mild distension of the cecum and proximal ascending colon with stool. Stool is also noted in the distal ileum. There is no distal ileal inflammation or fistula. There is no frank bowel obstruction. There is air in the colon distal to the area of wall thickening in the ascending colon. There are scattered colonic diverticula without diverticulitis evident. No free air or portal venous air appreciable. Vascular/Lymphatic: No abdominal aortic aneurysm. There are scattered foci of calcification in the aorta and common iliac arteries. No adenopathy is evident in the abdomen or pelvis. Reproductive: Prostate and seminal vesicles are normal in size and contour. No evident pelvic mass. Other: The appendix appears unremarkable. No periappendiceal region inflammatory changes evident. No abscess or ascites is evident in the abdomen or pelvis. Musculoskeletal: There is degenerative change in the lumbar spine with vacuum phenomenon at L5-S1. No blastic or lytic bone lesions are evident. There is no intramuscular or abdominal wall lesion. IMPRESSION: 1. Wall thickening throughout much of the mid and  mid to distal ascending colon. Concern for underlying neoplasm in the right colon. Note that there is mild distention of the proximal ascending colon and cecum, proximal to this area of wall thickening in the  mid ascending colon. Stool also is noted in the terminal ileum, likely due to relative obstruction from the apparent lesion in the ascending colon. No small bowel dilatation noted to indicate small bowel obstruction. This abnormality in the ascending colon warrants direct colonic visualization to assess for neoplasm. Note that no appreciable diverticular disease is noted in this portion of the colon. 2.  No abscess in the abdomen or pelvis.  Appendix appears normal. 3. No renal or ureteral calculus. No hydronephrosis. Urinary bladder wall thickness normal. 4.  Aortic Atherosclerosis (ICD10-I70.0). Electronically Signed   By: Lowella Grip III M.D.   On: 11/18/2018 11:36      Assessment/Plan HTN  HLD Thyroid disease  Wall thickening of ascending colon on CT - CT with wall thickening throughout much of the mid and mid to distal ascending colon concerning for underlying neoplasm in the right colon.  - There was also noted to be mild distention of the proximal ascending colon and cecum, proximal to this area of wall thickening in the mid ascending colon with stool noted the terminal ileum, that was felt to likely be due to relative obstruction from the apparent lesion in the ascending colon - Currently without any n/v. No indication for NGT at this time  - Recommend GI consult for evaluation - If GI recommends admission, we will continue to follow along in the hospital and would recommend hospitalist admit  - If GI recommends outpatient workup, will have him follow up in our office as outpatient after GI workup - Will order CEA - If being discharged, would need to be PO challenged prior to d/c by ED  Jillyn Ledger, [facility] Surgery 11/18/2018, 1:25 PM Please see Amion for  pager number during day hours 7:00am-4:30pm

## 2018-11-18 NOTE — ED Notes (Signed)
Pt ambulated to restroom without assistance.

## 2018-11-18 NOTE — ED Notes (Signed)
since 0300 abd pain with diarrhea x 2, cramping of horrible pain.  States same thing happened a month ago and caused dyspnea as well, given prednisone which helped.  Pain is constant but the severity changes.  Some nausea without emesis, no fever. No urinary sx noted.  States some difficulty breathing, but has used his inhaler.  Alert and oriented, using interpreter for ASL

## 2018-11-18 NOTE — Consult Note (Signed)
Referring Provider:   [facility] Surgery        Primary Care Physician:  [name], NP Primary Gastroenterologist:  unassigned           Reason for Consultation:    Abnormal CT scan, abdominal pain               ASSESSMENT /  PLAN    53 year old deaf male presenting with acute generalized abdominal pain and CTAP remarkable for wall thickening of the mid to distal ascending colon concerning for neoplasm. Of note, CTA of chest / abd / pelvis early October  (for SOB and chest pain) didn't show any bowel or other abnormalities -Through sign language interpreter I explained the need for colonoscopy.  The risk and benefits of the procedure were discussed.  The patient agrees to proceed.  -Clear liquids today, will start bowel prep this evening..  -Surgery is already on board  2. Asthma, on Singular and inhalers at home. No wheezing on exam   HPI:     Alexander Reeves. is a 53 y.o. male with pmh significant for asthma, hemorrhoid surgery, and deafness. History is obtained with assistance of ASL (interpreter). [name] woke up during the night with generalized abdominal cramping. He subsequently had a BM (normal stool). No nausea / vomiting. Following that he had another a couple more BMs, not as solid but not quite diarrhea.  At home patient says that his bowel movements have been relatively normal.  He has not seen any blood in stool.  No family history of colon cancer.  His weight has been stable  Alexander Reeves was seen in ED early October for evaluation of shortness of breath, chest/abdominal discomfort.  Initial troponin was elevated.  CT angio of the chest /abdomen /pelvis was negative for any acute findings.  Troponin trended down.  Case discussed with Cardiology, low suspicion for ACS.  Patient discharged from the ED with outpatient Cardiology follow-up.  Elevation in high-sensitivity troponin felt to be related more to asthma.  Patient was treated with epinephrine, albuterol and a  course of steroids.  Data Reviewed:  Today WBC 5.1, hgb 13.3, MCV 85 Normal renal function   Past Medical History:  Diagnosis Date  . Arthritis    KNEES  . Asthma   . Deaf   . GERD (gastroesophageal reflux disease)    OCC  . Scrotal cyst   . Thyroid disease   . Urinary frequency    OCC    Past Surgical History:  Procedure Laterality Date  . BLADDER SURGERY  YRS AGO  . EPIDIDYMECTOMY Right 03/22/2018   Procedure: SCROTAL EXPLORATION RIGHT  PARTIAL EPIDIDYMECTOMY;  Surgeon: [name], MD;  Location: [facility];  Service: Urology;  Laterality: Right;  . HEMORRHOID SURGERY  WHEN YOUNG  . NO PAST SURGERIES    . VASECTOMY  YRS AGO    Prior to Admission medications   Medication Sig Start Date End Date Taking? Authorizing Provider  albuterol (PROVENTIL) (2.5 MG/3ML) 0.083% nebulizer solution Take 3 mLs (2.5 mg total) by nebulization every 6 (six) hours as needed for wheezing or shortness of breath. 01/28/18 01/28/19  Elsie Stain, MD  albuterol (VENTOLIN HFA) 108 (90 Base) MCG/ACT inhaler Inhale 2 puffs into the lungs every 4 (four) hours as needed for wheezing or shortness of breath. 10/11/18   [name], NP  fenofibrate (TRICOR) 145 MG tablet Take 1 tablet (145 mg total) by mouth daily. 10/12/18  Juluis Mire P, NP  fluticasone furoate-vilanterol (BREO ELLIPTA) 200-25 MCG/INH AEPB Inhale 1 puff into the lungs daily. 10/11/18   [name], NP  loratadine (CLARITIN) 10 MG tablet Take 1 tablet (10 mg total) by mouth daily. 10/11/18   [name], NP  montelukast (SINGULAIR) 10 MG tablet Take 1 tablet (10 mg total) by mouth at bedtime. 10/11/18   [name], NP  predniSONE (DELTASONE) 50 MG tablet 1 tablet PO daily 10/17/18   Ezequiel Essex, MD    Current Facility-Administered Medications  Medication Dose Route Frequency Provider Last Rate Last Dose  . sodium chloride flush (NS) 0.9 % injection 3 mL  3 mL Intravenous  Once Carmin Muskrat, MD       Current Outpatient Medications  Medication Sig Dispense Refill  . albuterol (PROVENTIL) (2.5 MG/3ML) 0.083% nebulizer solution Take 3 mLs (2.5 mg total) by nebulization every 6 (six) hours as needed for wheezing or shortness of breath. 75 mL 12  . albuterol (VENTOLIN HFA) 108 (90 Base) MCG/ACT inhaler Inhale 2 puffs into the lungs every 4 (four) hours as needed for wheezing or shortness of breath. 6.7 g 1  . fenofibrate (TRICOR) 145 MG tablet Take 1 tablet (145 mg total) by mouth daily. 90 tablet 3  . fluticasone furoate-vilanterol (BREO ELLIPTA) 200-25 MCG/INH AEPB Inhale 1 puff into the lungs daily. 60 each 11  . loratadine (CLARITIN) 10 MG tablet Take 1 tablet (10 mg total) by mouth daily. 30 tablet 11  . montelukast (SINGULAIR) 10 MG tablet Take 1 tablet (10 mg total) by mouth at bedtime. 30 tablet 11  . predniSONE (DELTASONE) 50 MG tablet 1 tablet PO daily 5 tablet 0    Allergies as of 11/18/2018 - Review Complete 11/18/2018  Allergen Reaction Noted  . Cheese Shortness Of Breath and Swelling 09/01/2016  . Eggs or egg-derived products Shortness Of Breath and Swelling 09/01/2016  . Penicillins Anaphylaxis 12/07/2015    Family History  Problem Relation Age of Onset  . Heart disease Father     Social History   Socioeconomic History  . Marital status: Single    Spouse name: Not on file  . Number of children: Not on file  . Years of education: Not on file  . Highest education level: Not on file  Occupational History  . Not on file  Social Needs  . Financial resource strain: Not on file  . Food insecurity    Worry: Not on file    Inability: Not on file  . Transportation needs    Medical: Not on file    Non-medical: Not on file  Tobacco Use  . Smoking status: Former Smoker    Types: Cigarettes    Quit date: 06/24/2016    Years since quitting: 2.4  . Smokeless tobacco: Never Used  Substance and Sexual Activity  . Alcohol use: Yes     Comment: OCC  . Drug use: Yes    Types: Marijuana    Comment: 3 MONTHS AGO  . Sexual activity: Never    Birth control/protection: None  Lifestyle  . Physical activity    Days per week: Not on file    Minutes per session: Not on file  . Stress: Not on file  Relationships  . Social Herbalist on phone: Not on file    Gets together: Not on file    Attends religious service: Not on file    Active member of club or organization: Not on  file    Attends meetings of clubs or organizations: Not on file    Relationship status: Not on file  . Intimate partner violence    Fear of current or ex partner: Not on file    Emotionally abused: Not on file    Physically abused: Not on file    Forced sexual activity: Not on file  Other Topics Concern  . Not on file  Social History Narrative   [facility] (09/01/16):   No new pets. Moved from [place]. Questionable mold exposure at his previous residence and possibly some at his new apartment as well.     Review of Systems: All systems reviewed and negative except where noted in HPI.  Physical Exam: Vital signs in last 24 hours: Temp:  [98.3 F (36.8 C)] 98.3 F (36.8 C) (11/05 0423) Pulse Rate:  [72-88] 72 (11/05 1130) Resp:  [16] 16 (11/05 1130) BP: (131-133)/(88-91) 131/91 (11/05 1130) SpO2:  [97 %-100 %] 100 % (11/05 1130)   General:   Alert, well-developed,  male in NAD Psych:  Pleasant, cooperative. Normal mood and affect. Eyes:  Pupils equal, sclera clear, no icterus.   Conjunctiva pink. Ears:  Deaf. Nose:  No deformity, discharge,  or lesions. Neck:  Supple; no masses Lungs:  Clear throughout to auscultation.   No wheezes, crackles, or rhonchi.  Heart:  Regular rate and rhythm; no murmurs, no lower extremity edema Abdomen:  Soft, non-distended, nontender, BS active, no palp mass   Rectal:  Deferred  Msk:  Symmetrical without gross deformities. . Neurologic:  Alert and  oriented x4;  grossly normal neurologically.  Skin:  Intact without significant lesions or rashes.   Intake/Output from previous day: No intake/output data recorded. Intake/Output this shift: No intake/output data recorded.  Lab Results: Recent Labs    11/18/18 0430  WBC 5.1  HGB 13.3  HCT 42.5  PLT 190   BMET Recent Labs    11/18/18 0430  NA 141  K 3.8  CL 107  CO2 25  GLUCOSE 107*  BUN 9  CREATININE 1.24  CALCIUM 9.1   LFT Recent Labs    11/18/18 0430  PROT 6.8  ALBUMIN 4.1  AST 20  ALT 20  ALKPHOS 42  BILITOT 0.3     . CBC Latest Ref Rng & Units 11/18/2018 10/17/2018 10/17/2018  WBC 4.0 - 10.5 K/uL 5.1 - 8.0  Hemoglobin 13.0 - 17.0 g/dL 13.3 14.6 13.7  Hematocrit 39.0 - 52.0 % 42.5 43.0 43.8  Platelets 150 - 400 K/uL 190 - 165    . CMP Latest Ref Rng & Units 11/18/2018 10/17/2018 10/17/2018  Glucose 70 - 99 mg/dL 107(H) - 104(H)  BUN 6 - 20 mg/dL 9 - 11  Creatinine 0.61 - 1.24 mg/dL 1.24 - 1.14  Sodium 135 - 145 mmol/L 141 142 140  Potassium 3.5 - 5.1 mmol/L 3.8 4.0 4.0  Chloride 98 - 111 mmol/L 107 - 106  CO2 22 - 32 mmol/L 25 - 24  Calcium 8.9 - 10.3 mg/dL 9.1 - 8.6(L)  Total Protein 6.5 - 8.1 g/dL 6.8 - 7.2  Total Bilirubin 0.3 - 1.2 mg/dL 0.3 - 0.2(L)  Alkaline Phos 38 - 126 U/L 42 - 54  AST 15 - 41 U/L 20 - 23  ALT 0 - 44 U/L 20 - 18   Studies/Results: Ct Abdomen Pelvis W Contrast  Result Date: 11/18/2018 CLINICAL DATA:  Right-sided abdominal pain with nausea EXAM: CT ABDOMEN AND PELVIS WITH CONTRAST TECHNIQUE: Multidetector CT  imaging of the abdomen and pelvis was performed using the standard protocol following bolus administration of intravenous contrast. CONTRAST:  173mL OMNIPAQUE IOHEXOL 300 MG/ML  SOLN COMPARISON:  October 17, 2018 FINDINGS: Lower chest: There is atelectatic change in the left base. The lung bases otherwise are clear. Hepatobiliary: No focal liver lesions are evident on this study. Gallbladder wall is not appreciably thickened. There is no biliary duct dilatation.  Pancreas: There is no evident pancreatic mass or inflammatory focus. Spleen: No splenic lesions are appreciable. Adrenals/Urinary Tract: Adrenals bilaterally appear unremarkable. Kidneys bilaterally show no evident mass or hydronephrosis on either side. There is no evident renal or ureteral calculus on either side. Urinary bladder is midline with wall thickness within normal limits. Stomach/Bowel: There is wall thickening in the mid ascending colon region, extending over a distance of slightly greater than 5 cm. Proximal to this area of wall thickening in the ascending colon, there is mild distension of the cecum and proximal ascending colon with stool. Stool is also noted in the distal ileum. There is no distal ileal inflammation or fistula. There is no frank bowel obstruction. There is air in the colon distal to the area of wall thickening in the ascending colon. There are scattered colonic diverticula without diverticulitis evident. No free air or portal venous air appreciable. Vascular/Lymphatic: No abdominal aortic aneurysm. There are scattered foci of calcification in the aorta and common iliac arteries. No adenopathy is evident in the abdomen or pelvis. Reproductive: Prostate and seminal vesicles are normal in size and contour. No evident pelvic mass. Other: The appendix appears unremarkable. No periappendiceal region inflammatory changes evident. No abscess or ascites is evident in the abdomen or pelvis. Musculoskeletal: There is degenerative change in the lumbar spine with vacuum phenomenon at L5-S1. No blastic or lytic bone lesions are evident. There is no intramuscular or abdominal wall lesion. IMPRESSION: 1. Wall thickening throughout much of the mid and mid to distal ascending colon. Concern for underlying neoplasm in the right colon. Note that there is mild distention of the proximal ascending colon and cecum, proximal to this area of wall thickening in the mid ascending colon. Stool also is noted in  the terminal ileum, likely due to relative obstruction from the apparent lesion in the ascending colon. No small bowel dilatation noted to indicate small bowel obstruction. This abnormality in the ascending colon warrants direct colonic visualization to assess for neoplasm. Note that no appreciable diverticular disease is noted in this portion of the colon. 2.  No abscess in the abdomen or pelvis.  Appendix appears normal. 3. No renal or ureteral calculus. No hydronephrosis. Urinary bladder wall thickness normal. 4.  Aortic Atherosclerosis (ICD10-I70.0). Electronically Signed   By: Lowella Grip III M.D.   On: 11/18/2018 11:36    Active Problems:   * No active hospital problems. Tye Savoy, NP-C @  11/18/2018, 1:57 PM

## 2018-11-18 NOTE — ED Provider Notes (Signed)
[place] EMERGENCY DEPARTMENT Provider Note   CSN: [id number] Arrival date & time: 11/18/18  [id number]     History   Chief Complaint Chief Complaint  Patient presents with   Abdominal Pain    HPI Alexander Stelma. is a 53 y.o. male with history of asthma and GERD who presents with abdominal pain. Of note pt is deaf.  Patient states that his pain started overnight around 3AM. He reports generalized abdominal cramping and pain that is localized in the RLQ. It is constant, severe. Nothing makes it better or worse. The pain comes in waves and will intensify at times. He ate a salad and mushrooms last night around 8PM and is unsure if it's related to that. He denies fever chills, chest pain. He reports a "little" SOB. He reports nausea but no vomiting. He's had two episodes of diarrhea but isn't sure if there was blood in it or not. He denies any urinary symptoms or testicular pain. He reports having a bladder surgery in the past and recent removal of a scrotal cyst. He quit smoking a month ago. He had similar pain last month and was seen in the ED at that time. He was having SOB and abdominal pain. CTA of the chest/abd/pelvis was obtained and was negative. He was given prednisone and states that this improved his pain temporarily.     HPI  Past Medical History:  Diagnosis Date   Arthritis    KNEES   Asthma    Deaf    GERD (gastroesophageal reflux disease)    OCC   Scrotal cyst    Thyroid disease    Urinary frequency    OCC    Patient Active Problem List   Diagnosis Date Noted   Asthma exacerbation 09/01/2016   Chronic asthma, severe persistent, with acute exacerbation 08/10/2016    Past Surgical History:  Procedure Laterality Date   BLADDER SURGERY  YRS AGO   EPIDIDYMECTOMY Right 03/22/2018   Procedure: SCROTAL EXPLORATION RIGHT  PARTIAL EPIDIDYMECTOMY;  Surgeon: [name], MD;  Location: [facility];  Service: Urology;   Laterality: Right;   HEMORRHOID SURGERY  WHEN YOUNG   NO PAST SURGERIES     VASECTOMY  YRS AGO        Home Medications    Prior to Admission medications   Medication Sig Start Date End Date Taking? Authorizing Provider  albuterol (PROVENTIL) (2.5 MG/3ML) 0.083% nebulizer solution Take 3 mLs (2.5 mg total) by nebulization every 6 (six) hours as needed for wheezing or shortness of breath. 01/28/18 01/28/19  Elsie Stain, MD  albuterol (VENTOLIN HFA) 108 (90 Base) MCG/ACT inhaler Inhale 2 puffs into the lungs every 4 (four) hours as needed for wheezing or shortness of breath. 10/11/18   [name], NP  fenofibrate (TRICOR) 145 MG tablet Take 1 tablet (145 mg total) by mouth daily. 10/12/18   [name], NP  fluticasone furoate-vilanterol (BREO ELLIPTA) 200-25 MCG/INH AEPB Inhale 1 puff into the lungs daily. 10/11/18   [name], NP  loratadine (CLARITIN) 10 MG tablet Take 1 tablet (10 mg total) by mouth daily. 10/11/18   [name], NP  montelukast (SINGULAIR) 10 MG tablet Take 1 tablet (10 mg total) by mouth at bedtime. 10/11/18   [name], NP  predniSONE (DELTASONE) 50 MG tablet 1 tablet PO daily 10/17/18   Ezequiel Essex, MD    Family History Family History  Problem Relation Age  of Onset   Heart disease Father     Social History Social History   Tobacco Use   Smoking status: Former Smoker    Types: Cigarettes    Quit date: 06/24/2016    Years since quitting: 2.4   Smokeless tobacco: Never Used  Substance Use Topics   Alcohol use: Yes    Comment: OCC   Drug use: Yes    Types: Marijuana    Comment: 3 MONTHS AGO     Allergies   Cheese, Eggs or egg-derived products, and Penicillins   Review of Systems Review of Systems  Constitutional: Negative for chills and fever.  Respiratory: Negative for shortness of breath.   Cardiovascular: Negative for chest pain.  Gastrointestinal: Positive for abdominal pain, diarrhea  and nausea. Negative for vomiting.  Genitourinary: Negative for difficulty urinating, flank pain, frequency, hematuria, penile pain and scrotal swelling.  All other systems reviewed and are negative.    Physical Exam Updated Vital Signs BP 133/88 (BP Location: Right Arm)    Pulse 88    Temp 98.3 F (36.8 C) (Oral)    Resp 16    SpO2 97%   Physical Exam Vitals signs and nursing note reviewed.  Constitutional:      General: He is not in acute distress.    Appearance: He is well-developed. He is not ill-appearing.  HENT:     Head: Normocephalic and atraumatic.  Eyes:     General: No scleral icterus.       Right eye: No discharge.        Left eye: No discharge.     Conjunctiva/sclera: Conjunctivae normal.     Pupils: Pupils are equal, round, and reactive to light.  Neck:     Musculoskeletal: Normal range of motion.  Cardiovascular:     Rate and Rhythm: Normal rate and regular rhythm.  Pulmonary:     Effort: Pulmonary effort is normal. No respiratory distress.     Breath sounds: Normal breath sounds.  Abdominal:     General: There is no distension.     Palpations: Abdomen is soft. There is no mass.     Tenderness: There is abdominal tenderness (RLQ tenderness). There is no guarding or rebound.     Hernia: No hernia is present.  Skin:    General: Skin is warm and dry.  Neurological:     Mental Status: He is alert and oriented to person, place, and time.  Psychiatric:        Behavior: Behavior normal.      ED Treatments / Results  Labs (all labs ordered are listed, but only abnormal results are displayed) Labs Reviewed  COMPREHENSIVE METABOLIC PANEL - Abnormal; Notable for the following components:      Result Value   Glucose, Bld 107 (*)    All other components within normal limits  SARS CORONAVIRUS 2 (TAT 6-24 HRS)  LIPASE, BLOOD  CBC  URINALYSIS, ROUTINE W REFLEX MICROSCOPIC  CEA    EKG None  Radiology Ct Abdomen Pelvis W Contrast  Result Date:  11/18/2018 CLINICAL DATA:  Right-sided abdominal pain with nausea EXAM: CT ABDOMEN AND PELVIS WITH CONTRAST TECHNIQUE: Multidetector CT imaging of the abdomen and pelvis was performed using the standard protocol following bolus administration of intravenous contrast. CONTRAST:  170mL OMNIPAQUE IOHEXOL 300 MG/ML  SOLN COMPARISON:  October 17, 2018 FINDINGS: Lower chest: There is atelectatic change in the left base. The lung bases otherwise are clear. Hepatobiliary: No focal liver lesions are evident on  this study. Gallbladder wall is not appreciably thickened. There is no biliary duct dilatation. Pancreas: There is no evident pancreatic mass or inflammatory focus. Spleen: No splenic lesions are appreciable. Adrenals/Urinary Tract: Adrenals bilaterally appear unremarkable. Kidneys bilaterally show no evident mass or hydronephrosis on either side. There is no evident renal or ureteral calculus on either side. Urinary bladder is midline with wall thickness within normal limits. Stomach/Bowel: There is wall thickening in the mid ascending colon region, extending over a distance of slightly greater than 5 cm. Proximal to this area of wall thickening in the ascending colon, there is mild distension of the cecum and proximal ascending colon with stool. Stool is also noted in the distal ileum. There is no distal ileal inflammation or fistula. There is no frank bowel obstruction. There is air in the colon distal to the area of wall thickening in the ascending colon. There are scattered colonic diverticula without diverticulitis evident. No free air or portal venous air appreciable. Vascular/Lymphatic: No abdominal aortic aneurysm. There are scattered foci of calcification in the aorta and common iliac arteries. No adenopathy is evident in the abdomen or pelvis. Reproductive: Prostate and seminal vesicles are normal in size and contour. No evident pelvic mass. Other: The appendix appears unremarkable. No periappendiceal region  inflammatory changes evident. No abscess or ascites is evident in the abdomen or pelvis. Musculoskeletal: There is degenerative change in the lumbar spine with vacuum phenomenon at L5-S1. No blastic or lytic bone lesions are evident. There is no intramuscular or abdominal wall lesion. IMPRESSION: 1. Wall thickening throughout much of the mid and mid to distal ascending colon. Concern for underlying neoplasm in the right colon. Note that there is mild distention of the proximal ascending colon and cecum, proximal to this area of wall thickening in the mid ascending colon. Stool also is noted in the terminal ileum, likely due to relative obstruction from the apparent lesion in the ascending colon. No small bowel dilatation noted to indicate small bowel obstruction. This abnormality in the ascending colon warrants direct colonic visualization to assess for neoplasm. Note that no appreciable diverticular disease is noted in this portion of the colon. 2.  No abscess in the abdomen or pelvis.  Appendix appears normal. 3. No renal or ureteral calculus. No hydronephrosis. Urinary bladder wall thickness normal. 4.  Aortic Atherosclerosis (ICD10-I70.0). Electronically Signed   By: Lowella Grip III M.D.   On: 11/18/2018 11:36    Procedures Procedures (including critical care time)  Medications Ordered in ED Medications  sodium chloride flush (NS) 0.9 % injection 3 mL (3 mLs Intravenous Not Given 11/18/18 0931)  ondansetron (ZOFRAN-ODT) disintegrating tablet 4 mg (4 mg Oral Given 11/18/18 0436)  morphine 4 MG/ML injection 4 mg (4 mg Intravenous Given 11/18/18 0938)  sodium chloride 0.9 % bolus 1,000 mL (1,000 mLs Intravenous New Bag/Given 11/18/18 0937)  iohexol (OMNIPAQUE) 300 MG/ML solution 100 mL (100 mLs Intravenous Contrast Given 11/18/18 1123)     Initial Impression / Assessment and Plan / ED Course  I have reviewed the triage vital signs and the nursing notes.  Pertinent labs & imaging results that  were available during my care of the patient were reviewed by me and considered in my medical decision making (see chart for details).  53 year old male presents with acute onset of severe RLQ abdominal pain since last night. He is tender in the RLQ. He has had similar episodes of pain over the past month. DDx includes appendicitis, SBO, AAA, gastroenteritis,  perforated viscous, diverticulitis, IBD, cancer. Will obtain labs, UA, CT abdomen pelvis. Will give fluids, zofran, pain medicine.   Labs are unremarkable. CT is pending.  CT is remarkable for wall thickening throughout much of the mid and mid to distal ascending colon which is concerning for underlying neoplasm in the right colon. There is no overt obstruction although there is "stool noted in the terminal ileum, likely due to relative obstruction from the apparent lesion in the ascending colon". Pt reports he has never had a colonoscopy. He denies hx of colon cancer in the family. Has not had fever, weight loss, malaise, change in bowel habits. He has had possible bloody stools this morning but nothing significant. Will discuss with general surgery  Discussed with surgery - they feel surgical intervention is not warranted at this time and recommend GI consult. Discussed with Azucena Freed PA-C who will come to see. They are recommending admission.  Discussed with Dr. Laren Everts with [facility] who will admit   Final Clinical Impressions(s) / ED Diagnoses   Final diagnoses:  Right lower quadrant abdominal pain    ED Discharge Orders    None       Recardo Evangelist, PA-C 11/18/18 1539    Carmin Muskrat, MD 11/19/18 1118

## 2018-11-18 NOTE — H&P (View-Only) (Signed)
Referring Provider:   [facility]        Primary Care Physician:  [name], NP Primary Gastroenterologist:  unassigned           Reason for Consultation:    Abnormal CT scan, abdominal pain               ASSESSMENT /  PLAN    53 year old deaf male presenting with acute generalized abdominal pain and CTAP remarkable for wall thickening of the mid to distal ascending colon concerning for neoplasm. Of note, CTA of chest / abd / pelvis early October  (for SOB and chest pain) didn't show any bowel or other abnormalities -Through sign language interpreter I explained the need for colonoscopy.  The risk and benefits of the procedure were discussed.  The patient agrees to proceed.  -Clear liquids today, will start bowel prep this evening..  -Surgery is already on board  2. Asthma, on Singular and inhalers at home. No wheezing on exam   HPI:     Alexander Reeves. is a 53 y.o. male with pmh significant for asthma, hemorrhoid surgery, and deafness. History is obtained with assistance of ASL (interpreter). [name] woke up during the night with generalized abdominal cramping. He subsequently had a BM (normal stool). No nausea / vomiting. Following that he had another a couple more BMs, not as solid but not quite diarrhea.  At home patient says that his bowel movements have been relatively normal.  He has not seen any blood in stool.  No family history of colon cancer.  His weight has been stable  [name] was seen in ED early October for evaluation of shortness of breath, chest/abdominal discomfort.  Initial troponin was elevated.  CT angio of the chest /abdomen /pelvis was negative for any acute findings.  Troponin trended down.  Case discussed with Cardiology, low suspicion for ACS.  Patient discharged from the ED with outpatient Cardiology follow-up.  Elevation in high-sensitivity troponin felt to be related more to asthma.  Patient was treated with epinephrine, albuterol and a  course of steroids.  Data Reviewed:  Today WBC 5.1, hgb 13.3, MCV 85 Normal renal function   Past Medical History:  Diagnosis Date  . Arthritis    KNEES  . Asthma   . Deaf   . GERD (gastroesophageal reflux disease)    OCC  . Scrotal cyst   . Thyroid disease   . Urinary frequency    OCC    Past Surgical History:  Procedure Laterality Date  . BLADDER SURGERY  YRS AGO  . EPIDIDYMECTOMY Right 03/22/2018   Procedure: SCROTAL EXPLORATION RIGHT  PARTIAL EPIDIDYMECTOMY;  Surgeon: [name], MD;  Location: [facility];  Service: Urology;  Laterality: Right;  . HEMORRHOID SURGERY  WHEN YOUNG  . NO PAST SURGERIES    . VASECTOMY  YRS AGO    Prior to Admission medications   Medication Sig Start Date End Date Taking? Authorizing Provider  albuterol (PROVENTIL) (2.5 MG/3ML) 0.083% nebulizer solution Take 3 mLs (2.5 mg total) by nebulization every 6 (six) hours as needed for wheezing or shortness of breath. 01/28/18 01/28/19  Elsie Stain, MD  albuterol (VENTOLIN HFA) 108 (90 Base) MCG/ACT inhaler Inhale 2 puffs into the lungs every 4 (four) hours as needed for wheezing or shortness of breath. 10/11/18   [name], NP  fenofibrate (TRICOR) 145 MG tablet Take 1 tablet (145 mg total) by mouth daily. 10/12/18  Juluis Mire P, NP  fluticasone furoate-vilanterol (BREO ELLIPTA) 200-25 MCG/INH AEPB Inhale 1 puff into the lungs daily. 10/11/18   [name], NP  loratadine (CLARITIN) 10 MG tablet Take 1 tablet (10 mg total) by mouth daily. 10/11/18   [name], NP  montelukast (SINGULAIR) 10 MG tablet Take 1 tablet (10 mg total) by mouth at bedtime. 10/11/18   [name], NP  predniSONE (DELTASONE) 50 MG tablet 1 tablet PO daily 10/17/18   Ezequiel Essex, MD    Current Facility-Administered Medications  Medication Dose Route Frequency Provider Last Rate Last Dose  . sodium chloride flush (NS) 0.9 % injection 3 mL  3 mL Intravenous  Once Carmin Muskrat, MD       Current Outpatient Medications  Medication Sig Dispense Refill  . albuterol (PROVENTIL) (2.5 MG/3ML) 0.083% nebulizer solution Take 3 mLs (2.5 mg total) by nebulization every 6 (six) hours as needed for wheezing or shortness of breath. 75 mL 12  . albuterol (VENTOLIN HFA) 108 (90 Base) MCG/ACT inhaler Inhale 2 puffs into the lungs every 4 (four) hours as needed for wheezing or shortness of breath. 6.7 g 1  . fenofibrate (TRICOR) 145 MG tablet Take 1 tablet (145 mg total) by mouth daily. 90 tablet 3  . fluticasone furoate-vilanterol (BREO ELLIPTA) 200-25 MCG/INH AEPB Inhale 1 puff into the lungs daily. 60 each 11  . loratadine (CLARITIN) 10 MG tablet Take 1 tablet (10 mg total) by mouth daily. 30 tablet 11  . montelukast (SINGULAIR) 10 MG tablet Take 1 tablet (10 mg total) by mouth at bedtime. 30 tablet 11  . predniSONE (DELTASONE) 50 MG tablet 1 tablet PO daily 5 tablet 0    Allergies as of 11/18/2018 - Review Complete 11/18/2018  Allergen Reaction Noted  . Cheese Shortness Of Breath and Swelling 09/01/2016  . Eggs or egg-derived products Shortness Of Breath and Swelling 09/01/2016  . Penicillins Anaphylaxis 12/07/2015    Family History  Problem Relation Age of Onset  . Heart disease Father     Social History   Socioeconomic History  . Marital status: Single    Spouse name: Not on file  . Number of children: Not on file  . Years of education: Not on file  . Highest education level: Not on file  Occupational History  . Not on file  Social Needs  . Financial resource strain: Not on file  . Food insecurity    Worry: Not on file    Inability: Not on file  . Transportation needs    Medical: Not on file    Non-medical: Not on file  Tobacco Use  . Smoking status: Former Smoker    Types: Cigarettes    Quit date: 06/24/2016    Years since quitting: 2.4  . Smokeless tobacco: Never Used  Substance and Sexual Activity  . Alcohol use: Yes     Comment: OCC  . Drug use: Yes    Types: Marijuana    Comment: 3 MONTHS AGO  . Sexual activity: Never    Birth control/protection: None  Lifestyle  . Physical activity    Days per week: Not on file    Minutes per session: Not on file  . Stress: Not on file  Relationships  . Social Herbalist on phone: Not on file    Gets together: Not on file    Attends religious service: Not on file    Active member of club or organization: Not on  file    Attends meetings of clubs or organizations: Not on file    Relationship status: Not on file  . Intimate partner violence    Fear of current or ex partner: Not on file    Emotionally abused: Not on file    Physically abused: Not on file    Forced sexual activity: Not on file  Other Topics Concern  . Not on file  Social History Narrative   [facility] (09/01/16):   No new pets. Moved from [place]. Questionable mold exposure at his previous residence and possibly some at his new apartment as well.     Review of Systems: All systems reviewed and negative except where noted in HPI.  Physical Exam: Vital signs in last 24 hours: Temp:  [98.3 F (36.8 C)] 98.3 F (36.8 C) (11/05 0423) Pulse Rate:  [72-88] 72 (11/05 1130) Resp:  [16] 16 (11/05 1130) BP: (131-133)/(88-91) 131/91 (11/05 1130) SpO2:  [97 %-100 %] 100 % (11/05 1130)   General:   Alert, well-developed,  male in NAD Psych:  Pleasant, cooperative. Normal mood and affect. Eyes:  Pupils equal, sclera clear, no icterus.   Conjunctiva pink. Ears:  Deaf. Nose:  No deformity, discharge,  or lesions. Neck:  Supple; no masses Lungs:  Clear throughout to auscultation.   No wheezes, crackles, or rhonchi.  Heart:  Regular rate and rhythm; no murmurs, no lower extremity edema Abdomen:  Soft, non-distended, nontender, BS active, no palp mass   Rectal:  Deferred  Msk:  Symmetrical without gross deformities. . Neurologic:  Alert and  oriented x4;  grossly normal neurologically.  Skin:  Intact without significant lesions or rashes.   Intake/Output from previous day: No intake/output data recorded. Intake/Output this shift: No intake/output data recorded.  Lab Results: Recent Labs    11/18/18 0430  WBC 5.1  HGB 13.3  HCT 42.5  PLT 190   BMET Recent Labs    11/18/18 0430  NA 141  K 3.8  CL 107  CO2 25  GLUCOSE 107*  BUN 9  CREATININE 1.24  CALCIUM 9.1   LFT Recent Labs    11/18/18 0430  PROT 6.8  ALBUMIN 4.1  AST 20  ALT 20  ALKPHOS 42  BILITOT 0.3     . CBC Latest Ref Rng & Units 11/18/2018 10/17/2018 10/17/2018  WBC 4.0 - 10.5 K/uL 5.1 - 8.0  Hemoglobin 13.0 - 17.0 g/dL 13.3 14.6 13.7  Hematocrit 39.0 - 52.0 % 42.5 43.0 43.8  Platelets 150 - 400 K/uL 190 - 165    . CMP Latest Ref Rng & Units 11/18/2018 10/17/2018 10/17/2018  Glucose 70 - 99 mg/dL 107(H) - 104(H)  BUN 6 - 20 mg/dL 9 - 11  Creatinine 0.61 - 1.24 mg/dL 1.24 - 1.14  Sodium 135 - 145 mmol/L 141 142 140  Potassium 3.5 - 5.1 mmol/L 3.8 4.0 4.0  Chloride 98 - 111 mmol/L 107 - 106  CO2 22 - 32 mmol/L 25 - 24  Calcium 8.9 - 10.3 mg/dL 9.1 - 8.6(L)  Total Protein 6.5 - 8.1 g/dL 6.8 - 7.2  Total Bilirubin 0.3 - 1.2 mg/dL 0.3 - 0.2(L)  Alkaline Phos 38 - 126 U/L 42 - 54  AST 15 - 41 U/L 20 - 23  ALT 0 - 44 U/L 20 - 18   Studies/Results: Ct Abdomen Pelvis W Contrast  Result Date: 11/18/2018 CLINICAL DATA:  Right-sided abdominal pain with nausea EXAM: CT ABDOMEN AND PELVIS WITH CONTRAST TECHNIQUE: Multidetector CT  imaging of the abdomen and pelvis was performed using the standard protocol following bolus administration of intravenous contrast. CONTRAST:  157mL OMNIPAQUE IOHEXOL 300 MG/ML  SOLN COMPARISON:  October 17, 2018 FINDINGS: Lower chest: There is atelectatic change in the left base. The lung bases otherwise are clear. Hepatobiliary: No focal liver lesions are evident on this study. Gallbladder wall is not appreciably thickened. There is no biliary duct dilatation.  Pancreas: There is no evident pancreatic mass or inflammatory focus. Spleen: No splenic lesions are appreciable. Adrenals/Urinary Tract: Adrenals bilaterally appear unremarkable. Kidneys bilaterally show no evident mass or hydronephrosis on either side. There is no evident renal or ureteral calculus on either side. Urinary bladder is midline with wall thickness within normal limits. Stomach/Bowel: There is wall thickening in the mid ascending colon region, extending over a distance of slightly greater than 5 cm. Proximal to this area of wall thickening in the ascending colon, there is mild distension of the cecum and proximal ascending colon with stool. Stool is also noted in the distal ileum. There is no distal ileal inflammation or fistula. There is no frank bowel obstruction. There is air in the colon distal to the area of wall thickening in the ascending colon. There are scattered colonic diverticula without diverticulitis evident. No free air or portal venous air appreciable. Vascular/Lymphatic: No abdominal aortic aneurysm. There are scattered foci of calcification in the aorta and common iliac arteries. No adenopathy is evident in the abdomen or pelvis. Reproductive: Prostate and seminal vesicles are normal in size and contour. No evident pelvic mass. Other: The appendix appears unremarkable. No periappendiceal region inflammatory changes evident. No abscess or ascites is evident in the abdomen or pelvis. Musculoskeletal: There is degenerative change in the lumbar spine with vacuum phenomenon at L5-S1. No blastic or lytic bone lesions are evident. There is no intramuscular or abdominal wall lesion. IMPRESSION: 1. Wall thickening throughout much of the mid and mid to distal ascending colon. Concern for underlying neoplasm in the right colon. Note that there is mild distention of the proximal ascending colon and cecum, proximal to this area of wall thickening in the mid ascending colon. Stool also is noted in  the terminal ileum, likely due to relative obstruction from the apparent lesion in the ascending colon. No small bowel dilatation noted to indicate small bowel obstruction. This abnormality in the ascending colon warrants direct colonic visualization to assess for neoplasm. Note that no appreciable diverticular disease is noted in this portion of the colon. 2.  No abscess in the abdomen or pelvis.  Appendix appears normal. 3. No renal or ureteral calculus. No hydronephrosis. Urinary bladder wall thickness normal. 4.  Aortic Atherosclerosis (ICD10-I70.0). Electronically Signed   By: Lowella Grip III M.D.   On: 11/18/2018 11:36    Active Problems:   * No active hospital problems. Tye Savoy, NP-C @  11/18/2018, 1:57 PM

## 2018-11-19 ENCOUNTER — Encounter (HOSPITAL_COMMUNITY)
Admission: EM | Disposition: A | Discharge status: Home / Self Care | Payer: Self-pay | Source: Home / Self Care | Attending: Internal Medicine

## 2018-11-19 ENCOUNTER — Encounter (HOSPITAL_COMMUNITY): Payer: Self-pay | Admitting: Certified Registered"

## 2018-11-19 ENCOUNTER — Inpatient Hospital Stay (HOSPITAL_COMMUNITY): Payer: Medicare Other | Admitting: Anesthesiology

## 2018-11-19 ENCOUNTER — Observation Stay (HOSPITAL_COMMUNITY): Payer: Medicare Other | Admitting: Certified Registered"

## 2018-11-19 DIAGNOSIS — R1031 Right lower quadrant pain: Secondary | ICD-10-CM | POA: Diagnosis not present

## 2018-11-19 DIAGNOSIS — Z91012 Allergy to eggs: Secondary | ICD-10-CM | POA: Diagnosis not present

## 2018-11-19 DIAGNOSIS — K567 Ileus, unspecified: Secondary | ICD-10-CM | POA: Diagnosis not present

## 2018-11-19 DIAGNOSIS — K9189 Other postprocedural complications and disorders of digestive system: Secondary | ICD-10-CM | POA: Diagnosis not present

## 2018-11-19 DIAGNOSIS — D490 Neoplasm of unspecified behavior of digestive system: Secondary | ICD-10-CM | POA: Diagnosis not present

## 2018-11-19 DIAGNOSIS — J4551 Severe persistent asthma with (acute) exacerbation: Secondary | ICD-10-CM | POA: Diagnosis not present

## 2018-11-19 DIAGNOSIS — Z8249 Family history of ischemic heart disease and other diseases of the circulatory system: Secondary | ICD-10-CM | POA: Diagnosis not present

## 2018-11-19 DIAGNOSIS — Z880 Allergy status to penicillin: Secondary | ICD-10-CM | POA: Diagnosis not present

## 2018-11-19 DIAGNOSIS — R109 Unspecified abdominal pain: Secondary | ICD-10-CM | POA: Diagnosis not present

## 2018-11-19 DIAGNOSIS — H913 Deaf nonspeaking, not elsewhere classified: Secondary | ICD-10-CM | POA: Diagnosis present

## 2018-11-19 DIAGNOSIS — K6389 Other specified diseases of intestine: Secondary | ICD-10-CM | POA: Diagnosis not present

## 2018-11-19 DIAGNOSIS — K5730 Diverticulosis of large intestine without perforation or abscess without bleeding: Secondary | ICD-10-CM | POA: Diagnosis present

## 2018-11-19 DIAGNOSIS — K219 Gastro-esophageal reflux disease without esophagitis: Secondary | ICD-10-CM | POA: Diagnosis present

## 2018-11-19 DIAGNOSIS — R112 Nausea with vomiting, unspecified: Secondary | ICD-10-CM | POA: Diagnosis not present

## 2018-11-19 DIAGNOSIS — C772 Secondary and unspecified malignant neoplasm of intra-abdominal lymph nodes: Secondary | ICD-10-CM | POA: Diagnosis not present

## 2018-11-19 DIAGNOSIS — I10 Essential (primary) hypertension: Secondary | ICD-10-CM | POA: Diagnosis present

## 2018-11-19 DIAGNOSIS — Z91018 Allergy to other foods: Secondary | ICD-10-CM | POA: Diagnosis not present

## 2018-11-19 DIAGNOSIS — E785 Hyperlipidemia, unspecified: Secondary | ICD-10-CM | POA: Diagnosis present

## 2018-11-19 DIAGNOSIS — K560 Paralytic ileus: Secondary | ICD-10-CM | POA: Diagnosis not present

## 2018-11-19 DIAGNOSIS — K56691 Other complete intestinal obstruction: Secondary | ICD-10-CM

## 2018-11-19 DIAGNOSIS — Z7951 Long term (current) use of inhaled steroids: Secondary | ICD-10-CM | POA: Diagnosis not present

## 2018-11-19 DIAGNOSIS — Z87891 Personal history of nicotine dependence: Secondary | ICD-10-CM | POA: Diagnosis not present

## 2018-11-19 DIAGNOSIS — C182 Malignant neoplasm of ascending colon: Secondary | ICD-10-CM | POA: Diagnosis present

## 2018-11-19 DIAGNOSIS — E079 Disorder of thyroid, unspecified: Secondary | ICD-10-CM | POA: Diagnosis present

## 2018-11-19 DIAGNOSIS — J45909 Unspecified asthma, uncomplicated: Secondary | ICD-10-CM | POA: Diagnosis present

## 2018-11-19 DIAGNOSIS — R350 Frequency of micturition: Secondary | ICD-10-CM | POA: Diagnosis present

## 2018-11-19 DIAGNOSIS — R1110 Vomiting, unspecified: Secondary | ICD-10-CM | POA: Diagnosis not present

## 2018-11-19 DIAGNOSIS — Z4682 Encounter for fitting and adjustment of non-vascular catheter: Secondary | ICD-10-CM | POA: Diagnosis not present

## 2018-11-19 DIAGNOSIS — L729 Follicular cyst of the skin and subcutaneous tissue, unspecified: Secondary | ICD-10-CM | POA: Diagnosis present

## 2018-11-19 DIAGNOSIS — Z20828 Contact with and (suspected) exposure to other viral communicable diseases: Secondary | ICD-10-CM | POA: Diagnosis present

## 2018-11-19 DIAGNOSIS — C189 Malignant neoplasm of colon, unspecified: Secondary | ICD-10-CM | POA: Diagnosis not present

## 2018-11-19 DIAGNOSIS — R1030 Lower abdominal pain, unspecified: Secondary | ICD-10-CM | POA: Diagnosis not present

## 2018-11-19 DIAGNOSIS — R1011 Right upper quadrant pain: Secondary | ICD-10-CM | POA: Diagnosis not present

## 2018-11-19 DIAGNOSIS — M170 Bilateral primary osteoarthritis of knee: Secondary | ICD-10-CM | POA: Diagnosis present

## 2018-11-19 DIAGNOSIS — R000 Tachycardia, unspecified: Secondary | ICD-10-CM | POA: Diagnosis not present

## 2018-11-19 DIAGNOSIS — K56609 Unspecified intestinal obstruction, unspecified as to partial versus complete obstruction: Secondary | ICD-10-CM | POA: Diagnosis not present

## 2018-11-19 DIAGNOSIS — Z79899 Other long term (current) drug therapy: Secondary | ICD-10-CM | POA: Diagnosis not present

## 2018-11-19 DIAGNOSIS — Z7952 Long term (current) use of systemic steroids: Secondary | ICD-10-CM | POA: Diagnosis not present

## 2018-11-19 DIAGNOSIS — M1990 Unspecified osteoarthritis, unspecified site: Secondary | ICD-10-CM | POA: Diagnosis not present

## 2018-11-19 DIAGNOSIS — K648 Other hemorrhoids: Secondary | ICD-10-CM | POA: Diagnosis present

## 2018-11-19 HISTORY — PX: BIOPSY: SHX5522

## 2018-11-19 HISTORY — PX: LAPAROTOMY: SHX154

## 2018-11-19 HISTORY — PX: COLONOSCOPY WITH PROPOFOL: SHX5780

## 2018-11-19 HISTORY — PX: PARTIAL COLECTOMY: SHX5273

## 2018-11-19 HISTORY — PX: SUBMUCOSAL TATTOO INJECTION: SHX6856

## 2018-11-19 LAB — TYPE AND SCREEN
ABO/RH(D): O POS
Antibody Screen: NEGATIVE

## 2018-11-19 LAB — ABO/RH: ABO/RH(D): O POS

## 2018-11-19 SURGERY — COLONOSCOPY WITH PROPOFOL
Anesthesia: Monitor Anesthesia Care

## 2018-11-19 SURGERY — LAPAROTOMY, EXPLORATORY
Anesthesia: General | Site: Abdomen | Laterality: Right

## 2018-11-19 MED ORDER — 0.9 % SODIUM CHLORIDE (POUR BTL) OPTIME
TOPICAL | Status: DC | PRN
  Administered 2018-11-19: 2000 mL

## 2018-11-19 MED ORDER — ROCURONIUM BROMIDE 10 MG/ML (PF) SYRINGE
PREFILLED_SYRINGE | INTRAVENOUS | Status: AC
  Filled 2018-11-19: qty 10

## 2018-11-19 MED ORDER — MIDAZOLAM HCL 2 MG/2ML IJ SOLN
INTRAMUSCULAR | Status: AC
  Filled 2018-11-19: qty 2

## 2018-11-19 MED ORDER — SUCCINYLCHOLINE CHLORIDE 20 MG/ML IJ SOLN
INTRAMUSCULAR | Status: DC | PRN
  Administered 2018-11-19: 140 mg via INTRAVENOUS

## 2018-11-19 MED ORDER — CLONAZEPAM 0.5 MG PO TABS
1.0000 mg | ORAL_TABLET | Freq: Three times a day (TID) | ORAL | Status: DC | PRN
  Administered 2018-11-25 – 2018-11-26 (×2): 1 mg via ORAL
  Filled 2018-11-19 (×3): qty 2

## 2018-11-19 MED ORDER — ACETAMINOPHEN 10 MG/ML IV SOLN
1000.0000 mg | Freq: Four times a day (QID) | INTRAVENOUS | Status: DC
  Administered 2018-11-19 – 2018-11-20 (×3): 1000 mg via INTRAVENOUS
  Filled 2018-11-19 (×4): qty 100

## 2018-11-19 MED ORDER — ONDANSETRON HCL 4 MG PO TABS: 4.0000 mg | ORAL_TABLET | Freq: Four times a day (QID) | ORAL | Status: DC | PRN

## 2018-11-19 MED ORDER — ONDANSETRON HCL 4 MG/2ML IJ SOLN
INTRAMUSCULAR | Status: DC | PRN
  Administered 2018-11-19: 4 mg via INTRAVENOUS

## 2018-11-19 MED ORDER — PHENYLEPHRINE 40 MCG/ML (10ML) SYRINGE FOR IV PUSH (FOR BLOOD PRESSURE SUPPORT)
PREFILLED_SYRINGE | INTRAVENOUS | Status: DC | PRN
  Administered 2018-11-19: 120 ug via INTRAVENOUS

## 2018-11-19 MED ORDER — ONDANSETRON HCL 4 MG/2ML IJ SOLN: 4.0000 mg | Freq: Once | INTRAMUSCULAR | Status: DC | PRN

## 2018-11-19 MED ORDER — PHENYLEPHRINE 40 MCG/ML (10ML) SYRINGE FOR IV PUSH (FOR BLOOD PRESSURE SUPPORT)
PREFILLED_SYRINGE | INTRAVENOUS | Status: AC
  Filled 2018-11-19: qty 10

## 2018-11-19 MED ORDER — ONDANSETRON HCL 4 MG/2ML IJ SOLN
INTRAMUSCULAR | Status: AC
  Filled 2018-11-19: qty 2

## 2018-11-19 MED ORDER — CHLORHEXIDINE GLUCONATE CLOTH 2 % EX PADS
6.0000 | MEDICATED_PAD | Freq: Every day | CUTANEOUS | Status: DC
  Administered 2018-11-20 – 2018-12-02 (×11): 6 via TOPICAL

## 2018-11-19 MED ORDER — SODIUM CHLORIDE 0.9 % IV SOLN
INTRAVENOUS | Status: DC
  Administered 2018-11-19 – 2018-11-23 (×7): via INTRAVENOUS

## 2018-11-19 MED ORDER — MORPHINE SULFATE (PF) 2 MG/ML IV SOLN: 2.0000 mg | INTRAVENOUS | Status: DC | PRN

## 2018-11-19 MED ORDER — FENTANYL CITRATE (PF) 100 MCG/2ML IJ SOLN
25.0000 ug | INTRAMUSCULAR | Status: DC | PRN
  Administered 2018-11-19 (×2): 50 ug via INTRAVENOUS

## 2018-11-19 MED ORDER — SPOT INK MARKER SYRINGE KIT
PACK | SUBMUCOSAL | Status: DC | PRN
  Administered 2018-11-19: 3.5 mL via SUBMUCOSAL

## 2018-11-19 MED ORDER — LIDOCAINE 2% (20 MG/ML) 5 ML SYRINGE
INTRAMUSCULAR | Status: AC
  Filled 2018-11-19: qty 5

## 2018-11-19 MED ORDER — DEXAMETHASONE SODIUM PHOSPHATE 10 MG/ML IJ SOLN
INTRAMUSCULAR | Status: DC | PRN
  Administered 2018-11-19: 5 mg via INTRAVENOUS

## 2018-11-19 MED ORDER — MIDAZOLAM HCL 2 MG/2ML IJ SOLN
INTRAMUSCULAR | Status: DC | PRN
  Administered 2018-11-19: 2 mg via INTRAVENOUS

## 2018-11-19 MED ORDER — PROPOFOL 10 MG/ML IV BOLUS
INTRAVENOUS | Status: DC | PRN
  Administered 2018-11-19: 200 mg via INTRAVENOUS

## 2018-11-19 MED ORDER — FENTANYL CITRATE (PF) 250 MCG/5ML IJ SOLN
INTRAMUSCULAR | Status: AC
  Filled 2018-11-19: qty 5

## 2018-11-19 MED ORDER — OXYCODONE HCL 5 MG PO TABS: 5.0000 mg | ORAL_TABLET | Freq: Once | ORAL | Status: DC | PRN

## 2018-11-19 MED ORDER — HEMOSTATIC AGENTS (NO CHARGE) OPTIME
TOPICAL | Status: DC | PRN
  Administered 2018-11-19 (×3): 1 via TOPICAL

## 2018-11-19 MED ORDER — OXYCODONE HCL 5 MG/5ML PO SOLN: 5.0000 mg | Freq: Once | ORAL | Status: DC | PRN

## 2018-11-19 MED ORDER — PROPOFOL 500 MG/50ML IV EMUL
INTRAVENOUS | Status: DC | PRN
  Administered 2018-11-19: 150 ug/kg/min via INTRAVENOUS

## 2018-11-19 MED ORDER — FENTANYL CITRATE (PF) 250 MCG/5ML IJ SOLN
INTRAMUSCULAR | Status: DC | PRN
  Administered 2018-11-19 (×2): 50 ug via INTRAVENOUS
  Administered 2018-11-19: 100 ug via INTRAVENOUS
  Administered 2018-11-19: 50 ug via INTRAVENOUS

## 2018-11-19 MED ORDER — SUGAMMADEX SODIUM 200 MG/2ML IV SOLN
INTRAVENOUS | Status: DC | PRN
  Administered 2018-11-19: 200 mg via INTRAVENOUS

## 2018-11-19 MED ORDER — HYDROMORPHONE HCL 1 MG/ML IJ SOLN
INTRAMUSCULAR | Status: AC
  Filled 2018-11-19: qty 1

## 2018-11-19 MED ORDER — METHOCARBAMOL 1000 MG/10ML IJ SOLN
500.0000 mg | Freq: Three times a day (TID) | INTRAVENOUS | Status: DC
  Administered 2018-11-19 – 2018-11-26 (×19): 500 mg via INTRAVENOUS
  Filled 2018-11-19 (×6): qty 5
  Filled 2018-11-19: qty 500
  Filled 2018-11-19 (×15): qty 5

## 2018-11-19 MED ORDER — LACTATED RINGERS IV SOLN
INTRAVENOUS | Status: DC | PRN
  Administered 2018-11-19: 09:00:00 via INTRAVENOUS

## 2018-11-19 MED ORDER — HYDROMORPHONE HCL 1 MG/ML IJ SOLN
1.0000 mg | INTRAMUSCULAR | Status: DC | PRN
  Administered 2018-11-19 – 2018-11-25 (×4): 1 mg via INTRAVENOUS
  Filled 2018-11-19 (×3): qty 1

## 2018-11-19 MED ORDER — ACETAMINOPHEN 500 MG PO TABS
1000.0000 mg | ORAL_TABLET | ORAL | Status: AC
  Administered 2018-11-19: 1000 mg via ORAL
  Filled 2018-11-19: qty 2

## 2018-11-19 MED ORDER — SPOT INK MARKER SYRINGE KIT
PACK | SUBMUCOSAL | Status: AC
  Filled 2018-11-19: qty 5

## 2018-11-19 MED ORDER — ROCURONIUM BROMIDE 10 MG/ML (PF) SYRINGE
PREFILLED_SYRINGE | INTRAVENOUS | Status: DC | PRN
  Administered 2018-11-19: 30 mg via INTRAVENOUS
  Administered 2018-11-19: 40 mg via INTRAVENOUS

## 2018-11-19 MED ORDER — GABAPENTIN 300 MG PO CAPS
300.0000 mg | ORAL_CAPSULE | ORAL | Status: AC
  Administered 2018-11-19: 300 mg via ORAL
  Filled 2018-11-19: qty 1

## 2018-11-19 MED ORDER — CLINDAMYCIN PHOSPHATE 900 MG/50ML IV SOLN
900.0000 mg | INTRAVENOUS | Status: AC
  Administered 2018-11-19: 900 mg via INTRAVENOUS
  Filled 2018-11-19: qty 50

## 2018-11-19 MED ORDER — FENTANYL CITRATE (PF) 100 MCG/2ML IJ SOLN
INTRAMUSCULAR | Status: AC
  Filled 2018-11-19: qty 2

## 2018-11-19 MED ORDER — PROPOFOL 10 MG/ML IV BOLUS
INTRAVENOUS | Status: AC
  Filled 2018-11-19: qty 20

## 2018-11-19 MED ORDER — ALBUMIN HUMAN 5 % IV SOLN
INTRAVENOUS | Status: DC | PRN
  Administered 2018-11-19: 16:00:00 via INTRAVENOUS

## 2018-11-19 MED ORDER — CHLORHEXIDINE GLUCONATE CLOTH 2 % EX PADS
6.0000 | MEDICATED_PAD | Freq: Once | CUTANEOUS | Status: AC
  Administered 2018-11-19: 13:00:00 6 via TOPICAL

## 2018-11-19 MED ORDER — ALBUTEROL SULFATE (2.5 MG/3ML) 0.083% IN NEBU
INHALATION_SOLUTION | RESPIRATORY_TRACT | Status: AC
  Filled 2018-11-19: qty 3

## 2018-11-19 MED ORDER — ONDANSETRON HCL 4 MG/2ML IJ SOLN
4.0000 mg | Freq: Four times a day (QID) | INTRAMUSCULAR | Status: DC | PRN
  Administered 2018-11-22 – 2018-11-28 (×7): 4 mg via INTRAVENOUS
  Filled 2018-11-19 (×7): qty 2

## 2018-11-19 MED ORDER — OXYCODONE HCL 5 MG PO TABS
5.0000 mg | ORAL_TABLET | ORAL | Status: DC | PRN
  Administered 2018-11-20 (×2): 5 mg via ORAL
  Administered 2018-11-20: 10 mg via ORAL
  Administered 2018-11-20 (×2): 5 mg via ORAL
  Administered 2018-11-21 (×4): 10 mg via ORAL
  Filled 2018-11-19: qty 1
  Filled 2018-11-19 (×2): qty 2
  Filled 2018-11-19: qty 1
  Filled 2018-11-19: qty 2
  Filled 2018-11-19: qty 1
  Filled 2018-11-19 (×2): qty 2
  Filled 2018-11-19: qty 1

## 2018-11-19 MED ORDER — SUCCINYLCHOLINE CHLORIDE 200 MG/10ML IV SOSY
PREFILLED_SYRINGE | INTRAVENOUS | Status: AC
  Filled 2018-11-19: qty 10

## 2018-11-19 MED ORDER — LACTATED RINGERS IV SOLN
INTRAVENOUS | Status: DC | PRN
  Administered 2018-11-19: 14:00:00 via INTRAVENOUS

## 2018-11-19 MED ORDER — GENTAMICIN SULFATE 40 MG/ML IJ SOLN
5.0000 mg/kg | INTRAVENOUS | Status: AC
  Administered 2018-11-19: 15:00:00 390 mg via INTRAVENOUS
  Filled 2018-11-19: qty 9.75

## 2018-11-19 MED ORDER — PHENYLEPHRINE HCL-NACL 10-0.9 MG/250ML-% IV SOLN
INTRAVENOUS | Status: DC | PRN
  Administered 2018-11-19: 25 ug/min via INTRAVENOUS

## 2018-11-19 MED ORDER — HEPARIN SODIUM (PORCINE) 5000 UNIT/ML IJ SOLN
5000.0000 [IU] | Freq: Once | INTRAMUSCULAR | Status: AC
  Administered 2018-11-19: 5000 [IU] via SUBCUTANEOUS
  Filled 2018-11-19: qty 1

## 2018-11-19 MED ORDER — DEXAMETHASONE SODIUM PHOSPHATE 10 MG/ML IJ SOLN
INTRAMUSCULAR | Status: AC
  Filled 2018-11-19: qty 1

## 2018-11-19 MED ORDER — LIDOCAINE 2% (20 MG/ML) 5 ML SYRINGE
INTRAMUSCULAR | Status: DC | PRN
  Administered 2018-11-19: 80 mg via INTRAVENOUS

## 2018-11-19 MED ORDER — ENOXAPARIN SODIUM 40 MG/0.4ML ~~LOC~~ SOLN
40.0000 mg | SUBCUTANEOUS | Status: DC
  Administered 2018-11-20 – 2018-12-01 (×12): 40 mg via SUBCUTANEOUS
  Filled 2018-11-19 (×12): qty 0.4

## 2018-11-19 SURGICAL SUPPLY — 22 items

## 2018-11-19 SURGICAL SUPPLY — 66 items
APPLIER CLIP 9.375 MED OPEN (MISCELLANEOUS) ×4
BLADE CLIPPER SURG (BLADE) IMPLANT
CANISTER SUCT 3000ML PPV (MISCELLANEOUS) ×4 IMPLANT
CHLORAPREP W/TINT 26 (MISCELLANEOUS) ×4 IMPLANT
CLIP APPLIE 9.375 MED OPEN (MISCELLANEOUS) ×2 IMPLANT
COVER SURGICAL LIGHT HANDLE (MISCELLANEOUS) ×8 IMPLANT
COVER WAND RF STERILE (DRAPES) IMPLANT
DRAIN CHANNEL 19F RND (DRAIN) ×4 IMPLANT
DRAPE LAPAROSCOPIC ABDOMINAL (DRAPES) IMPLANT
DRAPE WARM FLUID 44X44 (DRAPES) ×4 IMPLANT
DRSG OPSITE POSTOP 4X10 (GAUZE/BANDAGES/DRESSINGS) ×4 IMPLANT
DRSG OPSITE POSTOP 4X8 (GAUZE/BANDAGES/DRESSINGS) IMPLANT
ELECT BLADE 4.0 EZ CLEAN MEGAD (MISCELLANEOUS) ×4
ELECT BLADE 6.5 EXT (BLADE) IMPLANT
ELECT CAUTERY BLADE 6.4 (BLADE) ×8 IMPLANT
ELECT REM PT RETURN 9FT ADLT (ELECTROSURGICAL) ×4
ELECTRODE BLDE 4.0 EZ CLN MEGD (MISCELLANEOUS) ×2 IMPLANT
ELECTRODE REM PT RTRN 9FT ADLT (ELECTROSURGICAL) ×2 IMPLANT
EVACUATOR SILICONE 100CC (DRAIN) ×4 IMPLANT
GLOVE BIO SURGEON STRL SZ7 (GLOVE) ×8 IMPLANT
GLOVE BIOGEL PI IND STRL 7.5 (GLOVE) ×4 IMPLANT
GLOVE BIOGEL PI INDICATOR 7.5 (GLOVE) ×4
GOWN STRL REUS W/ TWL LRG LVL3 (GOWN DISPOSABLE) ×8 IMPLANT
GOWN STRL REUS W/TWL LRG LVL3 (GOWN DISPOSABLE) ×8
HANDLE SUCTION POOLE (INSTRUMENTS) IMPLANT
HEMOSTAT SNOW SURGICEL 2X4 (HEMOSTASIS) ×12 IMPLANT
KIT BASIN OR (CUSTOM PROCEDURE TRAY) IMPLANT
KIT TURNOVER KIT B (KITS) ×4 IMPLANT
LIGASURE IMPACT 36 18CM CVD LR (INSTRUMENTS) ×4 IMPLANT
NS IRRIG 1000ML POUR BTL (IV SOLUTION) ×8 IMPLANT
PACK COLON (CUSTOM PROCEDURE TRAY) ×4 IMPLANT
PACK GENERAL/GYN (CUSTOM PROCEDURE TRAY) IMPLANT
PAD ARMBOARD 7.5X6 YLW CONV (MISCELLANEOUS) ×4 IMPLANT
PENCIL SMOKE EVACUATOR (MISCELLANEOUS) ×8 IMPLANT
RELOAD PROXIMATE 75MM BLUE (ENDOMECHANICALS) ×8 IMPLANT
SLEEVE SUCTION 125 (MISCELLANEOUS) ×4 IMPLANT
SPECIMEN JAR LARGE (MISCELLANEOUS) IMPLANT
SPONGE LAP 18X18 RF (DISPOSABLE) ×12 IMPLANT
STAPLER GUN LINEAR PROX 60 (STAPLE) ×4 IMPLANT
STAPLER PROXIMATE 75MM BLUE (STAPLE) ×4 IMPLANT
STAPLER VISISTAT 35W (STAPLE) ×4 IMPLANT
SUCTION POOLE HANDLE (INSTRUMENTS)
SURGILUBE 2OZ TUBE FLIPTOP (MISCELLANEOUS) IMPLANT
SUT ETHILON 2 0 FS 18 (SUTURE) ×4 IMPLANT
SUT PDS AB 1 TP1 96 (SUTURE) ×8 IMPLANT
SUT PROLENE 2 0 CT2 30 (SUTURE) IMPLANT
SUT PROLENE 2 0 KS (SUTURE) IMPLANT
SUT SILK 2 0 (SUTURE) ×2
SUT SILK 2 0 SH CR/8 (SUTURE) ×8 IMPLANT
SUT SILK 2 0 TIES 10X30 (SUTURE) ×4 IMPLANT
SUT SILK 2 0SH CR/8 30 (SUTURE) ×4 IMPLANT
SUT SILK 2-0 18XBRD TIE 12 (SUTURE) ×2 IMPLANT
SUT SILK 3 0 (SUTURE) ×2
SUT SILK 3 0 SH CR/8 (SUTURE) ×4 IMPLANT
SUT SILK 3 0 TIES 10X30 (SUTURE) ×4 IMPLANT
SUT SILK 3-0 18XBRD TIE 12 (SUTURE) ×2 IMPLANT
SUT VIC AB 3-0 SH 18 (SUTURE) IMPLANT
SUT VIC AB 3-0 SH 27 (SUTURE)
SUT VIC AB 3-0 SH 27X BRD (SUTURE) IMPLANT
TOWEL GREEN STERILE (TOWEL DISPOSABLE) ×4 IMPLANT
TRAY FOLEY MTR SLVR 14FR STAT (SET/KITS/TRAYS/PACK) IMPLANT
TRAY FOLEY MTR SLVR 16FR STAT (SET/KITS/TRAYS/PACK) ×4 IMPLANT
TRAY PROCTOSCOPIC FIBER OPTIC (SET/KITS/TRAYS/PACK) IMPLANT
TUBE CONNECTING 12'X1/4 (SUCTIONS) ×2
TUBE CONNECTING 12X1/4 (SUCTIONS) ×6 IMPLANT
YANKAUER SUCT BULB TIP NO VENT (SUCTIONS) IMPLANT

## 2018-11-19 NOTE — Anesthesia Preprocedure Evaluation (Addendum)
Anesthesia Evaluation  Patient identified by MRN, date of birth, ID band Patient awake    Reviewed: Allergy & Precautions, NPO status , Patient's Chart, lab work & pertinent test results  History of Anesthesia Complications Negative for: history of anesthetic complications  Airway Mallampati: II  TM Distance: >3 FB Neck ROM: Full    Dental  (+) Dental Advisory Given, Teeth Intact   Pulmonary asthma , Patient abstained from smoking., former smoker,    Pulmonary exam normal        Cardiovascular negative cardio ROS Normal cardiovascular exam     Neuro/Psych  Deaf  negative psych ROS   GI/Hepatic GERD  Controlled,(+)     substance abuse  marijuana use,  Right colon mass    Endo/Other  negative endocrine ROS  Renal/GU negative Renal ROS     Musculoskeletal  (+) Arthritis ,   Abdominal   Peds  Hematology negative hematology ROS (+)   Anesthesia Other Findings   Reproductive/Obstetrics                            Anesthesia Physical Anesthesia Plan  ASA: II and emergent  Anesthesia Plan: General   Post-op Pain Management:    Induction: Intravenous and Rapid sequence  PONV Risk Score and Plan: 4 or greater and Treatment may vary due to age or medical condition, Ondansetron, Dexamethasone and Midazolam  Airway Management Planned: Oral ETT  Additional Equipment: None  Intra-op Plan:   Post-operative Plan: Extubation in OR  Informed Consent: I have reviewed the patients History and Physical, chart, labs and discussed the procedure including the risks, benefits and alternatives for the proposed anesthesia with the patient or authorized representative who has indicated his/her understanding and acceptance.     Dental advisory given  Plan Discussed with: CRNA and Anesthesiologist  Anesthesia Plan Comments:        Anesthesia Quick Evaluation

## 2018-11-19 NOTE — Anesthesia Postprocedure Evaluation (Signed)
Anesthesia Post Note  Patient: Alexander OHMS Sr.  Procedure(s) Performed: COLONOSCOPY WITH PROPOFOL (N/A ) BIOPSY SUBMUCOSAL TATTOO INJECTION     Patient location during evaluation: PACU Anesthesia Type: MAC Level of consciousness: awake and alert Pain management: pain level controlled Vital Signs Assessment: post-procedure vital signs reviewed and stable Respiratory status: spontaneous breathing, nonlabored ventilation, respiratory function stable and patient connected to nasal cannula oxygen Cardiovascular status: stable and blood pressure returned to baseline Postop Assessment: no apparent nausea or vomiting Anesthetic complications: no    Last Vitals:  Vitals:   11/19/18 1000 11/19/18 1236  BP: (!) 133/92 105/87  Pulse: 89 96  Resp: 16 18  Temp:  36.9 C  SpO2: 100% 100%    Last Pain:  Vitals:   11/19/18 1236  TempSrc: Oral  PainSc:                  Dhanvin Szeto DAVID

## 2018-11-19 NOTE — Transfer of Care (Signed)
Immediate Anesthesia Transfer of Care Note  Patient: Alexander NOLE Sr.  Procedure(s) Performed: COLONOSCOPY WITH PROPOFOL (N/A ) BIOPSY SUBMUCOSAL TATTOO INJECTION  Patient Location: PACU  Anesthesia Type:MAC  Level of Consciousness: responds to stimulation  Airway & Oxygen Therapy: Patient Spontanous Breathing and Patient connected to nasal cannula oxygen  Post-op Assessment: Report given to RN and Post -op Vital signs reviewed and stable  Post vital signs: Reviewed and stable  Last Vitals:  Vitals Value Taken Time  BP    Temp    Pulse    Resp    SpO2      Last Pain:  Vitals:   11/19/18 0832  TempSrc:   PainSc: 3          Complications: No apparent anesthesia complications

## 2018-11-19 NOTE — Interval H&P Note (Signed)
History and Physical Interval Note:  11/19/2018 8:22 AM  Alexander Pelt Sr.  has presented today for surgery, with the diagnosis of abnormal CT scan, abdominal pain.  The various methods of treatment have been discussed with the patient and family. After consideration of risks, benefits and other options for treatment, the patient has consented to  Procedure(s): COLONOSCOPY WITH PROPOFOL (N/A) as a surgical intervention.  The patient's history has been reviewed, patient examined, no change in status, stable for surgery.  I have reviewed the patient's chart and labs.  Questions were answered to the patient's satisfaction.     [place]

## 2018-11-19 NOTE — Op Note (Signed)
Preoperative diagnosis: Right colon mass, colonic obstruction Postoperative diagnosis: Same as above Procedure: Right colectomy Surgeon: Dr. Serita Grammes Assistant:Michael Mazcis Anesthesia: General Drains: 19 French Blake drain to right upper quadrant Estimated blood loss: 100 cc Specimens: Right colon to pathology Complications: None Sponge and count was correct at completion Disposition to recovery stable condition  Indications: This a 53 year old male who comes in with abdominal pain.  On CT scan he appeared to have a right colon cancer.  This was confirmed by colonoscopy this morning.  He has not really passing any flatus or having bowel movements and became increasingly distended.  I discussed going to the operating room this afternoon for a right colectomy for likely right colon cancer.  Procedure: After informed consent was obtained the patient was taken to the operating room.  This was done via the sign interpreter.  He was given antibiotics per the colon protocol.  SCDs were in place.  Subcutaneous heparin was administered.  He was then placed under general anesthesia without complication.  A Foley catheter and orogastric tube were placed.  He was prepped and draped in the standard sterile surgical fashion.  A surgical timeout was then performed.  I made a midline incision and entered into his peritoneal cavity.  I then inserted a Bookwalter retractor.  I was able to release the white line and medialized the right colon.  There were some adhesions from the terminal ileum to the sigmoid colon that I lysed as well.  I then released the omentum from the transverse colon and took down the gastrocolic ligament.  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  I then divided the terminal ileum with a GIA stapler.  I picked an appropriate spot of the transverse colon and divided this with a GIA stapler as well.  I then proceeded to take  down the mesentery with a combination of silk ligatures and the LigaSure device.  Eventually when I was at the tumor it was very clear that this was adherent to the duodenum and the head of the pancreas.  This did not appear to be direct invasion but appeared to be a desmoplastic reaction.  With careful dissection eventually I peeled this off the head of the pancreas and the duodenum.  The duodenum did not have any injury to it.  The head of the pancreas had some fairly brisk bleeding at the completion which I controlled with a combination of cautery, sutures, and Surgicel.  This was hemostatic upon completion.  The specimen is passed off.  I then closed the mesenteric defect with 2-0 silk suture.  I then brought the terminal ileum in approximation with the transverse colon.  This was done with 3-0 silk sutures.  Using the coated colon protocol with towels I then brought these together with 3-0 silk suture.  I made enterotomies in both.  I create an anastomosis with a GIA stapler.  I then closed the common enterotomy with a TX stapler.  I then placed two 3-0 silk apex sutures.  This was hemostatic as well as patent.  I then placed the omentum overlying this.  I placed a 70 [place] Blake drain in the right upper quadrant near the duodenum and the head of the pancreas.  I secured this with a 2-0 nylon suture.  Hemostasis was observed then.  We then used the probe and the colon protocol and changed everything.  I then closed  with #1 looped PDS and staples.  A honeycomb dressing was placed.  He tolerated this well was extubated and transferred to the recovery room in stable condition.

## 2018-11-19 NOTE — Progress Notes (Signed)
RN gave report to short stay, They requested,  meds to be given but they are not verified by pharmacy therefore RN cannot pull the meds.

## 2018-11-19 NOTE — Progress Notes (Signed)
Progress Note    Bowdy Peretti  [id number] DOB: [birth year]  DOA: 11/18/2018 PCP: [name], NP    Brief Narrative:     Medical records reviewed and are as summarized below:  Kathrin Greathouse. is an 53 y.o. male  with past medical history significant for asthma, presenting to the emergency room with the 1 day history of right lower quadrant abdominal pain with associated nausea, no diarrhea no vomiting  Assessment/Plan:   Active Problems:   Abdominal pain   Abnormal CT scan, colon   Malignant neoplasm of ascending colon (HCC)   Colon cancer (HCC)    Abdominal pain due to infiltrative completely obstructing large mass was found in the mid ascending colon. CT of abdomen showing thickening in the ascending colon S/p colonoscopy: Likely malignant completely obstructing tumor in   the mid ascending colon. Biopsied. Tattooed. -headed to the OR with general surgery  Asthma Continue with nebs  Deaf -requires a sign language interpreter  Family Communication/Anticipated D/C date and plan/Code Status   DVT prophylaxis: Lovenox ordered. Code Status: Full Code.  Family Communication:  Disposition Plan:    Medical Consultants:    GI  surgery     Subjective:   Just back from colonoscopy and very upset  Objective:    Vitals:   11/19/18 0940 11/19/18 0950 11/19/18 1000 11/19/18 1236  BP: 92/67 121/87 (!) 133/92 105/87  Pulse: 89 80 89 96  Resp: 15 15 16 18   Temp:    98.4 F (36.9 C)  TempSrc:    Oral  SpO2: 100% 100% 100% 100%  Weight:      Height:        Intake/Output Summary (Last 24 hours) at 11/19/2018 1315 Last data filed at 11/19/2018 0935 Gross per 24 hour  Intake 1996.83 ml  Output --  Net 1996.83 ml   Filed Weights   11/18/18 2140  Weight: 78.8 kg    Exam: Tearful and upset Difficult to redirect   Data Reviewed:   I have personally reviewed following labs and imaging studies:  Labs: Labs show the  following:   Basic Metabolic Panel: Recent Labs  Lab 11/18/18 0430  NA 141  K 3.8  CL 107  CO2 25  GLUCOSE 107*  BUN 9  CREATININE 1.24  CALCIUM 9.1   GFR Estimated Creatinine Clearance: 72.8 mL/min (by C-G formula based on SCr of 1.24 mg/dL). Liver Function Tests: Recent Labs  Lab 11/18/18 0430  AST 20  ALT 20  ALKPHOS 42  BILITOT 0.3  PROT 6.8  ALBUMIN 4.1   Recent Labs  Lab 11/18/18 0430  LIPASE 34   No results for input(s): AMMONIA in the last 168 hours. Coagulation profile No results for input(s): INR, PROTIME in the last 168 hours.  CBC: Recent Labs  Lab 11/18/18 0430  WBC 5.1  HGB 13.3  HCT 42.5  MCV 85.2  PLT 190   Cardiac Enzymes: No results for input(s): CKTOTAL, CKMB, CKMBINDEX, TROPONINI in the last 168 hours. BNP (last 3 results) No results for input(s): PROBNP in the last 8760 hours. CBG: No results for input(s): GLUCAP in the last 168 hours. D-Dimer: No results for input(s): DDIMER in the last 72 hours. Hgb A1c: No results for input(s): HGBA1C in the last 72 hours. Lipid Profile: No results for input(s): CHOL, HDL, LDLCALC, TRIG, CHOLHDL, LDLDIRECT in the last 72 hours. Thyroid function studies: No results for input(s): TSH, T4TOTAL, T3FREE, THYROIDAB in the last  72 hours.  Invalid input(s): FREET3 Anemia work up: No results for input(s): VITAMINB12, FOLATE, FERRITIN, TIBC, IRON, RETICCTPCT in the last 72 hours. Sepsis Labs: Recent Labs  Lab 11/18/18 0430  WBC 5.1    Microbiology Recent Results (from the past 240 hour(s))  SARS CORONAVIRUS 2 (TAT 6-24 HRS) Nasopharyngeal Nasopharyngeal Swab     Status: None   Collection Time: 11/18/18  2:10 PM   Specimen: Nasopharyngeal Swab  Result Value Ref Range Status   SARS Coronavirus 2 NEGATIVE NEGATIVE Final    Comment: (NOTE) SARS-CoV-2 target nucleic acids are NOT DETECTED. The SARS-CoV-2 RNA is generally detectable in upper and lower respiratory specimens during the acute  phase of infection. Negative results do not preclude SARS-CoV-2 infection, do not rule out co-infections with other pathogens, and should not be used as the sole basis for treatment or other patient management decisions. Negative results must be combined with clinical observations, patient history, and epidemiological information. The expected result is Negative. Fact Sheet for Patients: SugarRoll.be Fact Sheet for Healthcare Providers: https://www.woods-mathews.com/ This test is not yet approved or cleared by the [place] FDA and  has been authorized for detection and/or diagnosis of SARS-CoV-2 by FDA under an Emergency Use Authorization (EUA). This EUA will remain  in effect (meaning this test can be used) for the duration of the COVID-19 declaration under Section 56 4(b)(1) of the Act, 21 U.S.C. section 360bbb-3(b)(1), unless the authorization is terminated or revoked sooner. Performed at [facility], [employer] [address], [place], [place] [address]     Procedures and diagnostic studies:  Ct Abdomen Pelvis W Contrast  Result Date: 11/18/2018 CLINICAL DATA:  Right-sided abdominal pain with nausea EXAM: CT ABDOMEN AND PELVIS WITH CONTRAST TECHNIQUE: Multidetector CT imaging of the abdomen and pelvis was performed using the standard protocol following bolus administration of intravenous contrast. CONTRAST:  138mL OMNIPAQUE IOHEXOL 300 MG/ML  SOLN COMPARISON:  October 17, 2018 FINDINGS: Lower chest: There is atelectatic change in the left base. The lung bases otherwise are clear. Hepatobiliary: No focal liver lesions are evident on this study. Gallbladder wall is not appreciably thickened. There is no biliary duct dilatation. Pancreas: There is no evident pancreatic mass or inflammatory focus. Spleen: No splenic lesions are appreciable. Adrenals/Urinary Tract: Adrenals bilaterally appear unremarkable. Kidneys bilaterally show no evident mass or  hydronephrosis on either side. There is no evident renal or ureteral calculus on either side. Urinary bladder is midline with wall thickness within normal limits. Stomach/Bowel: There is wall thickening in the mid ascending colon region, extending over a distance of slightly greater than 5 cm. Proximal to this area of wall thickening in the ascending colon, there is mild distension of the cecum and proximal ascending colon with stool. Stool is also noted in the distal ileum. There is no distal ileal inflammation or fistula. There is no frank bowel obstruction. There is air in the colon distal to the area of wall thickening in the ascending colon. There are scattered colonic diverticula without diverticulitis evident. No free air or portal venous air appreciable. Vascular/Lymphatic: No abdominal aortic aneurysm. There are scattered foci of calcification in the aorta and common iliac arteries. No adenopathy is evident in the abdomen or pelvis. Reproductive: Prostate and seminal vesicles are normal in size and contour. No evident pelvic mass. Other: The appendix appears unremarkable. No periappendiceal region inflammatory changes evident. No abscess or ascites is evident in the abdomen or pelvis. Musculoskeletal: There is degenerative change in the lumbar spine with  vacuum phenomenon at L5-S1. No blastic or lytic bone lesions are evident. There is no intramuscular or abdominal wall lesion. IMPRESSION: 1. Wall thickening throughout much of the mid and mid to distal ascending colon. Concern for underlying neoplasm in the right colon. Note that there is mild distention of the proximal ascending colon and cecum, proximal to this area of wall thickening in the mid ascending colon. Stool also is noted in the terminal ileum, likely due to relative obstruction from the apparent lesion in the ascending colon. No small bowel dilatation noted to indicate small bowel obstruction. This abnormality in the ascending colon warrants  direct colonic visualization to assess for neoplasm. Note that no appreciable diverticular disease is noted in this portion of the colon. 2.  No abscess in the abdomen or pelvis.  Appendix appears normal. 3. No renal or ureteral calculus. No hydronephrosis. Urinary bladder wall thickness normal. 4.  Aortic Atherosclerosis (ICD10-I70.0). Electronically Signed   By: Lowella Grip III M.D.   On: 11/18/2018 11:36    Medications:    [MAR Hold] fluticasone furoate-vilanterol  1 puff Inhalation Daily   [MAR Hold] loratadine  10 mg Oral Daily   [MAR Hold] montelukast  10 mg Oral QHS   [MAR Hold] predniSONE  50 mg Oral Daily   [MAR Hold] sodium chloride flush  3 mL Intravenous Once   Continuous Infusions:  clindamycin (CLEOCIN) IV     And   gentamicin     dextrose 5 % and 0.45% NaCl 50 mL/hr at 11/18/18 2201     LOS: 0 days   Geradine Girt  [facility]   How to contact the [facility] Attending or Consulting provider [place] or covering provider during after hours [place], for this patient?  1. Check the care team in [facility] and look for a) attending/consulting [facility] provider listed and b) the [facility] team listed 2. Log into [email or web address] and use [place]'s universal password to access. If you do not have the password, please contact the hospital operator. 3. Locate the [facility] provider you are looking for under [facility] and page to a number that you can be directly reached. 4. If you still have difficulty reaching the provider, please page the [facility] (Director on Call) for the Hospitalists listed on amion for assistance.  11/19/2018, 1:15 PM

## 2018-11-19 NOTE — Transfer of Care (Signed)
Immediate Anesthesia Transfer of Care Note  Patient: Alexander WESTLY Sr.  Procedure(s) Performed: EXPLORATORY LAPAROTOMY (N/A Abdomen) PARTIAL COLECTOMY (Right Abdomen)  Patient Location: PACU  Anesthesia Type:General  Level of Consciousness: awake, alert  and oriented  Airway & Oxygen Therapy: Patient Spontanous Breathing and Patient connected to face mask oxygen  Post-op Assessment: Report given to RN and Post -op Vital signs reviewed and stable  Post vital signs: Reviewed and stable  Last Vitals:  Vitals Value Taken Time  BP 168/89   Temp    Pulse 78   Resp 10   SpO2 100     Last Pain:  Vitals:   11/19/18 1236  TempSrc: Oral  PainSc:          Complications: No apparent anesthesia complications

## 2018-11-19 NOTE — Progress Notes (Signed)
Arrived to unit last evening. resp even and unlabored skin warm and dry. Alert and oriented. Uses written communication or ASL. Has cell phone. Pt completed bowel prep as ordered. Stools liquid brown.

## 2018-11-19 NOTE — Op Note (Signed)
[facility] Patient Name: Alexander Reeves Procedure Date : 11/19/2018 MRN: [id number] Attending MD: Thornton Park MD, MD Date of Birth: [birth year] CSN: [id number] Age: 53 Admit Type: Inpatient Procedure:                Colonoscopy Indications:              Abnormal CT of the GI tract Providers:                Thornton Park MD, MD, Grace Isaac, RN, Janie                            Billups, Technician,carrie maness CRNA Referring MD:              Medicines:                Monitored Anesthesia Care Complications:            No immediate complications. Estimated Blood Loss:     Estimated blood loss: none. Procedure:                Pre-Anesthesia Assessment:                           - Prior to the procedure, a History and Physical                            was performed, and patient medications and                            allergies were reviewed. The patient's tolerance of                            previous anesthesia was also reviewed. The risks                            and benefits of the procedure and the sedation                            options and risks were discussed with the patient.                            All questions were answered, and informed consent                            was obtained. Prior Anticoagulants: The patient has                            taken no previous anticoagulant or antiplatelet                            agents. ASA Grade Assessment: II - A patient with                            mild systemic disease. After reviewing the risks  and benefits, the patient was deemed in                            satisfactory condition to undergo the procedure.                           After obtaining informed consent, the colonoscope                            was passed under direct vision. Throughout the                            procedure, the patient's blood pressure, pulse, and   oxygen saturations were monitored continuously. The                            [id number]) Olympus colonoscope was                            introduced through the anus and advanced to the the                            ascending colon to examine a mass. This was the                            intended extent. The colonoscopy was performed                            without difficulty. The patient tolerated the                            procedure well. The quality of the bowel                            preparation was good. Scope In: 9:15:35 AM Scope Out: 9:30:05 AM Scope Withdrawal Time: 0 hours 10 minutes 11 seconds  Total Procedure Duration: 0 hours 14 minutes 30 seconds  Findings:      The perianal and digital rectal examinations were normal.      Multiple small and large-mouthed diverticula were found in the entire       colon. No evidence for diverticulitis or SCAD.      An infiltrative completely obstructing large mass was found in the mid       ascending colon. The mass measured unknown cm in length as I could not       advance beyond. No bleeding was present. The mass was biopsied with a       cold forceps for histology. Area was tattooed with an injection of 2 mL       of Spot (carbon black).      Non-bleeding internal hemorrhoids were found. Impression:               - Diverticulosis in the entire examined colon.                           - Likely malignant completely obstructing tumor in  the mid ascending colon. Biopsied. Tattooed.                           - Non-bleeding internal hemorrhoids. Recommendation:           - Return patient to hospital ward for ongoing care.                           - Clears today. NPO after midnight.                           - Continue present medications.                           - Await pathology results.                           - Discussed with [name]. Probable surgery                             tomorrow. Procedure Code(s):        --- Professional ---                           [phone], 15, Colonoscopy, flexible; with directed                            submucosal injection(s), any substance                           [id number], 26, Colonoscopy, flexible; with biopsy,                            single or multiple Diagnosis Code(s):        --- Professional ---                           K64.8, Other hemorrhoids                           D49.0, Neoplasm of unspecified behavior of                            digestive system                           K56.691, Other complete intestinal obstruction                           K57.30, Diverticulosis of large intestine without                            perforation or abscess without bleeding                           R93.3, Abnormal findings on diagnostic imaging of  other parts of digestive tract CPT copyright 2019 [name] Medical Association. All rights reserved. The codes documented in this report are preliminary and upon coder review may  be revised to meet current compliance requirements. Thornton Park MD, MD 11/19/2018 9:49:17 AM This report has been signed electronically. Number of Addenda: 0

## 2018-11-19 NOTE — Progress Notes (Signed)
Patient ID: Alexander WILCKEN Sr., male   DOB: 05-22-1965, 53 y.o.   MRN: [id number] Csc results noted with ascending colon mass.  He is obstructed from this, distended and mildly tender. I think best option is to proceed with surgery today. Discussed via sign interpreter right colectomy with risks, recovery etc. Will proceed asap today

## 2018-11-19 NOTE — Anesthesia Postprocedure Evaluation (Signed)
Anesthesia Post Note  Patient: Alexander DUNST Sr.  Procedure(s) Performed: EXPLORATORY LAPAROTOMY (N/A Abdomen) PARTIAL COLECTOMY (Right Abdomen)     Patient location during evaluation: PACU Anesthesia Type: General Level of consciousness: awake and alert Pain management: pain level controlled Vital Signs Assessment: post-procedure vital signs reviewed and stable Respiratory status: spontaneous breathing, nonlabored ventilation, respiratory function stable and patient connected to nasal cannula oxygen Cardiovascular status: blood pressure returned to baseline and stable Postop Assessment: no apparent nausea or vomiting Anesthetic complications: no    Last Vitals:  Vitals:   11/19/18 1703 11/19/18 1828  BP: (!) 146/103 (!) 153/87  Pulse: 79   Resp: 10 16  Temp:  (!) 36.3 C  SpO2: 94% 100%    Last Pain:  Vitals:   11/19/18 1838  TempSrc:   PainSc: Asleep                 Ryan P Ellender

## 2018-11-19 NOTE — Progress Notes (Signed)
Patient ID: Alexander SIMONIAN Sr., male   DOB: [birth year], 53 y.o.   MRN: [id number] Will await colonoscopy results today

## 2018-11-19 NOTE — Anesthesia Procedure Notes (Signed)
Procedure Name: Intubation Date/Time: 11/19/2018 2:24 PM Performed by: Alain Marion, CRNA Pre-anesthesia Checklist: Patient identified, Emergency Drugs available, Suction available and Patient being monitored Patient Re-evaluated:Patient Re-evaluated prior to induction Oxygen Delivery Method: Circle system utilized Preoxygenation: Pre-oxygenation with 100% oxygen Induction Type: IV induction, Rapid sequence and Cricoid Pressure applied Ventilation: Mask ventilation without difficulty Laryngoscope Size: Mac and 4 Grade View: Grade II Tube type: Oral Tube size: 7.5 mm Number of attempts: 1 Airway Equipment and Method: Stylet Placement Confirmation: ETT inserted through vocal cords under direct vision,  positive ETCO2 and breath sounds checked- equal and bilateral Secured at: 23 cm Tube secured with: Tape Dental Injury: Teeth and Oropharynx as per pre-operative assessment  Comments: Intubated by Orma Flaming, SRNA

## 2018-11-19 NOTE — Anesthesia Preprocedure Evaluation (Signed)
Anesthesia Evaluation  Patient identified by MRN, date of birth, ID band Patient awake    Reviewed: Allergy & Precautions, NPO status , Patient's Chart, lab work & pertinent test results  Airway Mallampati: I  TM Distance: >3 FB Neck ROM: Full    Dental   Pulmonary Patient abstained from smoking., former smoker,    Pulmonary exam normal breath sounds clear to auscultation       Cardiovascular Normal cardiovascular exam     Neuro/Psych    GI/Hepatic GERD  Medicated and Controlled,  Endo/Other    Renal/GU      Musculoskeletal   Abdominal   Peds  Hematology   Anesthesia Other Findings   Reproductive/Obstetrics                             Anesthesia Physical Anesthesia Plan  ASA: II  Anesthesia Plan: MAC   Post-op Pain Management:    Induction: Intravenous  PONV Risk Score and Plan: 1 and Treatment may vary due to age or medical condition  Airway Management Planned: Nasal Cannula  Additional Equipment:   Intra-op Plan:   Post-operative Plan:   Informed Consent: I have reviewed the patients History and Physical, chart, labs and discussed the procedure including the risks, benefits and alternatives for the proposed anesthesia with the patient or authorized representative who has indicated his/her understanding and acceptance.       Plan Discussed with: CRNA and Surgeon  Anesthesia Plan Comments:         Anesthesia Quick Evaluation

## 2018-11-20 ENCOUNTER — Encounter (HOSPITAL_COMMUNITY): Payer: Self-pay | Admitting: General Surgery

## 2018-11-20 LAB — CBC
HCT: 41.2 % (ref 39.0–52.0)
Hemoglobin: 12.8 g/dL — ABNORMAL LOW (ref 13.0–17.0)
MCH: 26.2 pg (ref 26.0–34.0)
MCHC: 31.1 g/dL (ref 30.0–36.0)
MCV: 84.4 fL (ref 80.0–100.0)
Platelets: 189 10*3/uL (ref 150–400)
RBC: 4.88 MIL/uL (ref 4.22–5.81)
RDW: 14.7 % (ref 11.5–15.5)
WBC: 7.5 10*3/uL (ref 4.0–10.5)
nRBC: 0 % (ref 0.0–0.2)

## 2018-11-20 LAB — BASIC METABOLIC PANEL
Anion gap: 12 (ref 5–15)
BUN: 6 mg/dL (ref 6–20)
CO2: 22 mmol/L (ref 22–32)
Calcium: 8.2 mg/dL — ABNORMAL LOW (ref 8.9–10.3)
Chloride: 101 mmol/L (ref 98–111)
Creatinine, Ser: 1.15 mg/dL (ref 0.61–1.24)
GFR calc Af Amer: >60 mL/min (ref >60)
GFR calc non Af Amer: >60 mL/min (ref >60)
Glucose, Bld: 102 mg/dL — ABNORMAL HIGH (ref 70–99)
Potassium: 4.5 mmol/L (ref 3.5–5.1)
Sodium: 135 mmol/L (ref 135–145)

## 2018-11-20 LAB — CEA: CEA: 2.7 ng/mL (ref 0.0–4.7)

## 2018-11-20 MED ORDER — ACETAMINOPHEN 10 MG/ML IV SOLN
1000.0000 mg | Freq: Four times a day (QID) | INTRAVENOUS | Status: AC
  Administered 2018-11-20 – 2018-11-21 (×3): 1000 mg via INTRAVENOUS
  Filled 2018-11-20 (×3): qty 100

## 2018-11-20 MED ORDER — ACETAMINOPHEN 10 MG/ML IV SOLN
1000.0000 mg | Freq: Four times a day (QID) | INTRAVENOUS | Status: DC
  Filled 2018-11-20 (×2): qty 100

## 2018-11-20 NOTE — Progress Notes (Signed)
Patient got up from bed and ambulated around the room and sat in chair for about  1hr 50 min. Patient stated he was in pain and ready to lay down. Patient given pain medication and encouraged to sit in chair for a bit longer but patient refused. Will continue to monitor patient.

## 2018-11-20 NOTE — Progress Notes (Signed)
[facility]  PROGRESS NOTE  Alexander Reeves [id number] DOB: [birth year] DOA: 11/18/2018 PCP: [name], NP   Brief HPI:   53 year old male with a history of asthma, speech and hearing impaired, communicates with a sign language came to hospital with 1 day history of right lower quadrant abdominal pain.  CT of the abdomen pelvis showed mid and mid to distal ascending colon wall thickening concerning for neoplasm of the right colon.  General surgery and gastroenterology were consulted.  Patient underwent right colectomy for right colon mass on 11/19/2018.    Subjective   Patient seen and examined, pain is controlled at this time.  General surgery following.  Patient is currently n.p.o.   Assessment/Plan:     1. Right colon mass, s/p right colectomy-patient presented with abdominal pain, CT abdominal showed right colon mass, underwent right colectomy.  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  Await biopsy results.  2. Asthma-continue with as needed nebs.  3. Speech and hearing impaired-patient communicates with sign language interpreter.   CBC: Recent Labs  Lab 11/18/18 0430 11/20/18 0304  WBC 5.1 7.5  HGB 13.3 12.8*  HCT 42.5 41.2  MCV 85.2 84.4  PLT 190 [id number]    Basic Metabolic Panel: Recent Labs  Lab 11/18/18 0430 11/20/18 0304  NA 141 135  K 3.8 4.5  CL 107 101  CO2 25 22  GLUCOSE 107* 102*  BUN 9 6  CREATININE 1.24 1.15  CALCIUM 9.1 8.2*     DVT prophylaxis: Lovenox  Code Status: Full code  Family Communication: Discussed with wife at bedside with the help of RN who communicates with sign language  Disposition Plan: likely home when medically ready for discharge        BMI  Estimated body mass index is 24.93 kg/m as calculated from the following:   Height as of this encounter: 5\' 10"  (1.778 m).   Weight as of this encounter: 78.8 kg.  Scheduled medications:  . Chlorhexidine Gluconate Cloth  6 each Topical Daily  .  enoxaparin (LOVENOX) injection  40 mg Subcutaneous Q24H  . fluticasone furoate-vilanterol  1 puff Inhalation Daily  . loratadine  10 mg Oral Daily  . montelukast  10 mg Oral QHS  . predniSONE  50 mg Oral Daily  . sodium chloride flush  3 mL Intravenous Once    Consultants:  General surgery  Gastroenterology  Procedures:  Right colectomy   Antibiotics:   Anti-infectives (From admission, onward)   Start     Dose/Rate Route Frequency Ordered Stop   11/19/18 1223  clindamycin (CLEOCIN) IVPB 900 mg     900 mg 100 mL/hr over 30 Minutes Intravenous 60 min pre-op 11/19/18 1223 11/19/18 1430   11/19/18 1223  gentamicin (GARAMYCIN) 390 mg in dextrose 5 % 100 mL IVPB     5 mg/kg  78.8 kg 109.8 mL/hr over 60 Minutes Intravenous 60 min pre-op 11/19/18 1223 11/19/18 1500       Objective   Vitals:   11/19/18 1828 11/19/18 2317 11/20/18 0553 11/20/18 0752  BP: (!) 153/87 90/66 127/79   Pulse:  99 88   Resp: 16 16 16    Temp: (!) 97.3 F (36.3 C) 98.8 F (37.1 C) 98.9 F (37.2 C)   TempSrc: Oral Oral Oral   SpO2: 100% 98% 98% 98%  Weight:      Height:        Intake/Output Summary (Last 24 hours) at 11/20/2018 1049 Last data filed at 11/20/2018 0458 Gross per  24 hour  Intake 1750 ml  Output 2210 ml  Net -460 ml   Filed Weights   11/18/18 2140  Weight: 78.8 kg     Physical Examination:    General: Appears in no acute distress  Cardiovascular: S1-S2, regular, no murmur auscultated  Respiratory: Clear to auscultation bilaterally  Abdomen: Abdomen is soft, mild tenderness to palpation, JP drain in place  Extremities: No edema in the lower extremities  Neurologic: Alert, oriented x3, intact insight and judgment     Data Reviewed: I have personally reviewed following labs and imaging studies   Recent Results (from the past 240 hour(s))  SARS CORONAVIRUS 2 (TAT 6-24 HRS) Nasopharyngeal Nasopharyngeal Swab     Status: None   Collection Time: 11/18/18  2:10  PM   Specimen: Nasopharyngeal Swab  Result Value Ref Range Status   SARS Coronavirus 2 NEGATIVE NEGATIVE Final    Comment: (NOTE) SARS-CoV-2 target nucleic acids are NOT DETECTED. The SARS-CoV-2 RNA is generally detectable in upper and lower respiratory specimens during the acute phase of infection. Negative results do not preclude SARS-CoV-2 infection, do not rule out co-infections with other pathogens, and should not be used as the sole basis for treatment or other patient management decisions. Negative results must be combined with clinical observations, patient history, and epidemiological information. The expected result is Negative. Fact Sheet for Patients: SugarRoll.be Fact Sheet for Healthcare Providers: https://www.woods-mathews.com/ This test is not yet approved or cleared by the [place] FDA and  has been authorized for detection and/or diagnosis of SARS-CoV-2 by FDA under an Emergency Use Authorization (EUA). This EUA will remain  in effect (meaning this test can be used) for the duration of the COVID-19 declaration under Section 56 4(b)(1) of the Act, 21 U.S.C. section 360bbb-3(b)(1), unless the authorization is terminated or revoked sooner. Performed at [facility], [place] [address] [place], [place] [address]      Liver Function Tests: Recent Labs  Lab 11/18/18 0430  AST 20  ALT 20  ALKPHOS 42  BILITOT 0.3  PROT 6.8  ALBUMIN 4.1   Recent Labs  Lab 11/18/18 0430  LIPASE 34   No results for input(s): AMMONIA in the last 168 hours.  Cardiac Enzymes: No results for input(s): CKTOTAL, CKMB, CKMBINDEX, TROPONINI in the last 168 hours. BNP (last 3 results) Recent Labs    10/17/18 0423  BNP 19.4    ProBNP (last 3 results) No results for input(s): PROBNP in the last 8760 hours.    Studies: Ct Abdomen Pelvis W Contrast  Result Date: 11/18/2018 CLINICAL DATA:  Right-sided abdominal pain with nausea  EXAM: CT ABDOMEN AND PELVIS WITH CONTRAST TECHNIQUE: Multidetector CT imaging of the abdomen and pelvis was performed using the standard protocol following bolus administration of intravenous contrast. CONTRAST:  147mL OMNIPAQUE IOHEXOL 300 MG/ML  SOLN COMPARISON:  October 17, 2018 FINDINGS: Lower chest: There is atelectatic change in the left base. The lung bases otherwise are clear. Hepatobiliary: No focal liver lesions are evident on this study. Gallbladder wall is not appreciably thickened. There is no biliary duct dilatation. Pancreas: There is no evident pancreatic mass or inflammatory focus. Spleen: No splenic lesions are appreciable. Adrenals/Urinary Tract: Adrenals bilaterally appear unremarkable. Kidneys bilaterally show no evident mass or hydronephrosis on either side. There is no evident renal or ureteral calculus on either side. Urinary bladder is midline with wall thickness within normal limits. Stomach/Bowel: There is wall thickening in the mid ascending colon region, extending over a distance  of slightly greater than 5 cm. Proximal to this area of wall thickening in the ascending colon, there is mild distension of the cecum and proximal ascending colon with stool. Stool is also noted in the distal ileum. There is no distal ileal inflammation or fistula. There is no frank bowel obstruction. There is air in the colon distal to the area of wall thickening in the ascending colon. There are scattered colonic diverticula without diverticulitis evident. No free air or portal venous air appreciable. Vascular/Lymphatic: No abdominal aortic aneurysm. There are scattered foci of calcification in the aorta and common iliac arteries. No adenopathy is evident in the abdomen or pelvis. Reproductive: Prostate and seminal vesicles are normal in size and contour. No evident pelvic mass. Other: The appendix appears unremarkable. No periappendiceal region inflammatory changes evident. No abscess or ascites is evident in  the abdomen or pelvis. Musculoskeletal: There is degenerative change in the lumbar spine with vacuum phenomenon at L5-S1. No blastic or lytic bone lesions are evident. There is no intramuscular or abdominal wall lesion. IMPRESSION: 1. Wall thickening throughout much of the mid and mid to distal ascending colon. Concern for underlying neoplasm in the right colon. Note that there is mild distention of the proximal ascending colon and cecum, proximal to this area of wall thickening in the mid ascending colon. Stool also is noted in the terminal ileum, likely due to relative obstruction from the apparent lesion in the ascending colon. No small bowel dilatation noted to indicate small bowel obstruction. This abnormality in the ascending colon warrants direct colonic visualization to assess for neoplasm. Note that no appreciable diverticular disease is noted in this portion of the colon. 2.  No abscess in the abdomen or pelvis.  Appendix appears normal. 3. No renal or ureteral calculus. No hydronephrosis. Urinary bladder wall thickness normal. 4.  Aortic Atherosclerosis (ICD10-I70.0). Electronically Signed   By: Lowella Grip III M.D.   On: 11/18/2018 11:36     Admission status: Inpatient: Based on patients clinical presentation and evaluation of above clinical data, I have made determination that patient meets Inpatient criteria at this time.   [facility] Pager [phone]. If 7PM-7AM, please contact night-coverage at [email or web address], Office  [phone]  password [email or web address]  11/20/2018, 10:49 AM  LOS: 1 day

## 2018-11-20 NOTE — Progress Notes (Signed)
Per post op order, removed foley catheter at [place]. No complication. Will pass on to day shift Rn.

## 2018-11-20 NOTE — Progress Notes (Signed)
[facility] Progress Note  1 Day Post-Op  Subjective: CC-  Abdomen sore but pain currently well controlled. He reports some intermittent cramping pain. A little bloated, denies n/v. No flatus or BM. Already ambulated this morning. Foley out and urinating without issues.  Objective: Vital signs in last 24 hours: Temp:  [97.3 F (36.3 C)-98.9 F (37.2 C)] 98.9 F (37.2 C) (11/07 0553) Pulse Rate:  [79-99] 88 (11/07 0553) Resp:  [10-95] 16 (11/07 0553) BP: (82-164)/(66-103) 127/79 (11/07 0553) SpO2:  [94 %-100 %] 98 % (11/07 0752)    Intake/Output from previous day: 11/06 0701 - 11/07 0700 In: 2150 [I.V.:1900; IV Piggyback:250] Out: 2210 [Urine:1920; Drains:190; Blood:100] Intake/Output this shift: No intake/output data recorded.  PE: Gen:  Alert, NAD, pleasant HEENT: EOM's intact, pupils equal and round Card:  RRR Pulm:  CTAB, no W/R/R, rate and effort normal Abd: Soft, mild distension, appropriately tender, few BS heard, midline incision cdi with honeycomb dressing in place and some dried blood noted on dressing, JP drain with serosanguinous output Skin: no rashes noted, warm and dry  Lab Results:  Recent Labs    11/18/18 0430 11/20/18 0304  WBC 5.1 7.5  HGB 13.3 12.8*  HCT 42.5 41.2  PLT 190 189   BMET Recent Labs    11/18/18 0430 11/20/18 0304  NA 141 135  K 3.8 4.5  CL 107 101  CO2 25 22  GLUCOSE 107* 102*  BUN 9 6  CREATININE 1.24 1.15  CALCIUM 9.1 8.2*   PT/INR No results for input(s): LABPROT, INR in the last 72 hours. CMP     Component Value Date/Time   NA 135 11/20/2018 0304   NA 141 10/11/2018 1115   K 4.5 11/20/2018 0304   CL 101 11/20/2018 0304   CO2 22 11/20/2018 0304   GLUCOSE 102 (H) 11/20/2018 0304   BUN 6 11/20/2018 0304   BUN 9 10/11/2018 1115   CREATININE 1.15 11/20/2018 0304   CALCIUM 8.2 (L) 11/20/2018 0304   PROT 6.8 11/18/2018 0430   PROT 7.1 10/11/2018 1115   ALBUMIN 4.1 11/18/2018 0430   ALBUMIN 4.4  10/11/2018 1115   AST 20 11/18/2018 0430   ALT 20 11/18/2018 0430   ALKPHOS 42 11/18/2018 0430   BILITOT 0.3 11/18/2018 0430   BILITOT 0.2 10/11/2018 1115   GFRNONAA >60 11/20/2018 0304   GFRAA >60 11/20/2018 0304   Lipase     Component Value Date/Time   LIPASE 34 11/18/2018 0430       Studies/Results: Ct Abdomen Pelvis W Contrast  Result Date: 11/18/2018 CLINICAL DATA:  Right-sided abdominal pain with nausea EXAM: CT ABDOMEN AND PELVIS WITH CONTRAST TECHNIQUE: Multidetector CT imaging of the abdomen and pelvis was performed using the standard protocol following bolus administration of intravenous contrast. CONTRAST:  114mL OMNIPAQUE IOHEXOL 300 MG/ML  SOLN COMPARISON:  October 17, 2018 FINDINGS: Lower chest: There is atelectatic change in the left base. The lung bases otherwise are clear. Hepatobiliary: No focal liver lesions are evident on this study. Gallbladder wall is not appreciably thickened. There is no biliary duct dilatation. Pancreas: There is no evident pancreatic mass or inflammatory focus. Spleen: No splenic lesions are appreciable. Adrenals/Urinary Tract: Adrenals bilaterally appear unremarkable. Kidneys bilaterally show no evident mass or hydronephrosis on either side. There is no evident renal or ureteral calculus on either side. Urinary bladder is midline with wall thickness within normal limits. Stomach/Bowel: There is wall thickening in the mid ascending colon region, extending over a distance  of slightly greater than 5 cm. Proximal to this area of wall thickening in the ascending colon, there is mild distension of the cecum and proximal ascending colon with stool. Stool is also noted in the distal ileum. There is no distal ileal inflammation or fistula. There is no frank bowel obstruction. There is air in the colon distal to the area of wall thickening in the ascending colon. There are scattered colonic diverticula without diverticulitis evident. No free air or portal venous  air appreciable. Vascular/Lymphatic: No abdominal aortic aneurysm. There are scattered foci of calcification in the aorta and common iliac arteries. No adenopathy is evident in the abdomen or pelvis. Reproductive: Prostate and seminal vesicles are normal in size and contour. No evident pelvic mass. Other: The appendix appears unremarkable. No periappendiceal region inflammatory changes evident. No abscess or ascites is evident in the abdomen or pelvis. Musculoskeletal: There is degenerative change in the lumbar spine with vacuum phenomenon at L5-S1. No blastic or lytic bone lesions are evident. There is no intramuscular or abdominal wall lesion. IMPRESSION: 1. Wall thickening throughout much of the mid and mid to distal ascending colon. Concern for underlying neoplasm in the right colon. Note that there is mild distention of the proximal ascending colon and cecum, proximal to this area of wall thickening in the mid ascending colon. Stool also is noted in the terminal ileum, likely due to relative obstruction from the apparent lesion in the ascending colon. No small bowel dilatation noted to indicate small bowel obstruction. This abnormality in the ascending colon warrants direct colonic visualization to assess for neoplasm. Note that no appreciable diverticular disease is noted in this portion of the colon. 2.  No abscess in the abdomen or pelvis.  Appendix appears normal. 3. No renal or ureteral calculus. No hydronephrosis. Urinary bladder wall thickness normal. 4.  Aortic Atherosclerosis (ICD10-I70.0). Electronically Signed   By: Lowella Grip III M.D.   On: 11/18/2018 11:36    Anti-infectives: Anti-infectives (From admission, onward)   Start     Dose/Rate Route Frequency Ordered Stop   11/19/18 1223  clindamycin (CLEOCIN) IVPB 900 mg     900 mg 100 mL/hr over 30 Minutes Intravenous 60 min pre-op 11/19/18 1223 11/19/18 1430   11/19/18 1223  gentamicin (GARAMYCIN) 390 mg in dextrose 5 % 100 mL IVPB      5 mg/kg  78.8 kg 109.8 mL/hr over 60 Minutes Intravenous 60 min pre-op 11/19/18 1223 11/19/18 1500       Assessment/Plan HTN  HLD Asthma Thyroid disease Deaf  Right colon mass, colonic obstruction S/p Right colectomy 11/6 [name] - POD#1 - surgical path pending - JP drain SS, monitor; continue drain at least until eating - no flatus or BM - Continue NPO and await return in bowel function prior to advancing. Ambulate. Continue IV robaxin and IV tylenol for pain control.   ID - abx periop FEN - IVF, NPO except chips/sips with meds VTE - SCDs, lovenox Foley - d/c 11/7 Follow up - [name]   LOS: 1 day    Wellington Hampshire, [facility] 11/20/2018, 8:47 AM Please see Amion for pager number during day hours 7:00am-4:30pm

## 2018-11-21 ENCOUNTER — Encounter (HOSPITAL_COMMUNITY): Payer: Self-pay | Admitting: Gastroenterology

## 2018-11-21 LAB — MAGNESIUM: Magnesium: 2.1 mg/dL (ref 1.7–2.4)

## 2018-11-21 LAB — CBC
HCT: 35.2 % — ABNORMAL LOW (ref 39.0–52.0)
Hemoglobin: 11.1 g/dL — ABNORMAL LOW (ref 13.0–17.0)
MCH: 26.6 pg (ref 26.0–34.0)
MCHC: 31.5 g/dL (ref 30.0–36.0)
MCV: 84.4 fL (ref 80.0–100.0)
Platelets: 191 10*3/uL (ref 150–400)
RBC: 4.17 MIL/uL — ABNORMAL LOW (ref 4.22–5.81)
RDW: 14.9 % (ref 11.5–15.5)
WBC: 6.5 10*3/uL (ref 4.0–10.5)
nRBC: 0 % (ref 0.0–0.2)

## 2018-11-21 LAB — BASIC METABOLIC PANEL
Anion gap: 11 (ref 5–15)
BUN: 7 mg/dL (ref 6–20)
CO2: 22 mmol/L (ref 22–32)
Calcium: 8.2 mg/dL — ABNORMAL LOW (ref 8.9–10.3)
Chloride: 104 mmol/L (ref 98–111)
Creatinine, Ser: 1.1 mg/dL (ref 0.61–1.24)
GFR calc Af Amer: >60 mL/min (ref >60)
GFR calc non Af Amer: >60 mL/min (ref >60)
Glucose, Bld: 82 mg/dL (ref 70–99)
Potassium: 4 mmol/L (ref 3.5–5.1)
Sodium: 137 mmol/L (ref 135–145)

## 2018-11-21 MED ORDER — PREDNISONE 10 MG PO TABS: 10.0000 mg | ORAL_TABLET | Freq: Every day | ORAL | Status: DC

## 2018-11-21 MED ORDER — PREDNISONE 5 MG PO TABS: 5.0000 mg | ORAL_TABLET | Freq: Every day | ORAL | Status: DC

## 2018-11-21 MED ORDER — ACETAMINOPHEN 500 MG PO TABS
1000.0000 mg | ORAL_TABLET | Freq: Three times a day (TID) | ORAL | Status: DC
  Administered 2018-11-21 (×3): 1000 mg via ORAL
  Filled 2018-11-21 (×3): qty 2

## 2018-11-21 MED ORDER — PREDNISONE 20 MG PO TABS: 20.0000 mg | ORAL_TABLET | Freq: Every day | ORAL | Status: DC

## 2018-11-21 MED ORDER — PREDNISONE 20 MG PO TABS
30.0000 mg | ORAL_TABLET | Freq: Every day | ORAL | Status: DC
  Administered 2018-11-21: 30 mg via ORAL

## 2018-11-21 NOTE — Progress Notes (Signed)
Patient ambulated 3x in the hallways and tolerated it well.

## 2018-11-21 NOTE — Progress Notes (Signed)
[facility]  PROGRESS NOTE  Alexander Reeves [id number] DOB: Jun 23, 1965 DOA: 11/18/2018 PCP: [name], NP   Brief HPI:   53 year old male with a history of asthma, speech and hearing impaired, communicates with a sign language came to hospital with 1 day history of right lower quadrant abdominal pain.  CT of the abdomen pelvis showed mid and mid to distal ascending colon wall thickening concerning for neoplasm of the right colon.  General surgery and gastroenterology were consulted.  Patient underwent right colectomy for right colon mass on 11/19/2018.    Subjective   Patient seen and examined, communicated with tele interpreter for speech change hearing impaired.  As per patient he was prescribed tapering dose of prednisone a month ago which she stopped taking in 5 days.  Prednisone 50 mg was started at the time of admission, however patient was not taking prednisone at home.  Patient says it was prescribed a month ago for possible asthma exacerbation.   Assessment/Plan:     1. Right colon mass, s/p right colectomy-patient presented with abdominal pain, CT abdominal showed right colon mass, underwent right colectomy.  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  Await biopsy results.  2. Asthma-continue with as needed nebs.  Will discontinue prednisone, no current exacerbation.  3. Speech and hearing impaired-patient communicates with sign language interpreter.   CBC: Recent Labs  Lab 11/18/18 0430 11/20/18 0304 11/21/18 0526  WBC 5.1 7.5 6.5  HGB 13.3 12.8* 11.1*  HCT 42.5 41.2 35.2*  MCV 85.2 84.4 84.4  PLT 190 189 [id number]    Basic Metabolic Panel: Recent Labs  Lab 11/18/18 0430 11/20/18 0304 11/21/18 0526  NA 141 135 137  K 3.8 4.5 4.0  CL 107 101 104  CO2 25 22 22   GLUCOSE 107* 102* 82  BUN 9 6 7   CREATININE 1.24 1.15 1.10  CALCIUM 9.1 8.2* 8.2*  MG  --   --  2.1     DVT prophylaxis: Lovenox  Code Status: Full code  Family Communication:  Discussed with wife at bedside with the help of RN who communicates with sign language  Disposition Plan: likely home when medically ready for discharge        BMI  Estimated body mass index is 24.83 kg/m as calculated from the following:   Height as of this encounter: 5\' 10"  (1.778 m).   Weight as of this encounter: 78.5 kg.  Scheduled medications:  . acetaminophen  1,000 mg Oral Q8H  . Chlorhexidine Gluconate Cloth  6 each Topical Daily  . enoxaparin (LOVENOX) injection  40 mg Subcutaneous Q24H  . fluticasone furoate-vilanterol  1 puff Inhalation Daily  . loratadine  10 mg Oral Daily  . montelukast  10 mg Oral QHS  . sodium chloride flush  3 mL Intravenous Once    Consultants:  General surgery  Gastroenterology  Procedures:  Right colectomy   Antibiotics:   Anti-infectives (From admission, onward)   Start     Dose/Rate Route Frequency Ordered Stop   11/19/18 1223  clindamycin (CLEOCIN) IVPB 900 mg     900 mg 100 mL/hr over 30 Minutes Intravenous 60 min pre-op 11/19/18 1223 11/19/18 1430   11/19/18 1223  gentamicin (GARAMYCIN) 390 mg in dextrose 5 % 100 mL IVPB     5 mg/kg  78.8 kg 109.8 mL/hr over 60 Minutes Intravenous 60 min pre-op 11/19/18 1223 11/19/18 1500       Objective   Vitals:   11/21/18 0500 11/21/18 0549 11/21/18 0741  11/21/18 0832  BP:  130/88  (!) 144/85  Pulse:  80  83  Resp:  18  18  Temp:  98 F (36.7 C)  97.9 F (36.6 C)  TempSrc:  Oral  Oral  SpO2:  98% 99% 95%  Weight: 78.5 kg     Height:        Intake/Output Summary (Last 24 hours) at 11/21/2018 0944 Last data filed at 11/21/2018 0900 Gross per 24 hour  Intake 4360.74 ml  Output 780 ml  Net 3580.74 ml   Filed Weights   11/18/18 2140 11/21/18 0500  Weight: 78.8 kg 78.5 kg     Physical Examination:  General-appears in no acute distress Heart-S1-S2, regular, no murmur auscultated Lungs-clear to auscultation bilaterally, no wheezing or crackles  auscultated Abdomen-soft, nondistended, mild generalized tenderness to palpation Extremities-no edema in the lower extremities Neuro-alert, oriented x3, no focal deficit noted    Data Reviewed: I have personally reviewed following labs and imaging studies   Recent Results (from the past 240 hour(s))  SARS CORONAVIRUS 2 (TAT 6-24 HRS) Nasopharyngeal Nasopharyngeal Swab     Status: None   Collection Time: 11/18/18  2:10 PM   Specimen: Nasopharyngeal Swab  Result Value Ref Range Status   SARS Coronavirus 2 NEGATIVE NEGATIVE Final    Comment: (NOTE) SARS-CoV-2 target nucleic acids are NOT DETECTED. The SARS-CoV-2 RNA is generally detectable in upper and lower respiratory specimens during the acute phase of infection. Negative results do not preclude SARS-CoV-2 infection, do not rule out co-infections with other pathogens, and should not be used as the sole basis for treatment or other patient management decisions. Negative results must be combined with clinical observations, patient history, and epidemiological information. The expected result is Negative. Fact Sheet for Patients: SugarRoll.be Fact Sheet for Healthcare Providers: https://www.woods-mathews.com/ This test is not yet approved or cleared by the [place] FDA and  has been authorized for detection and/or diagnosis of SARS-CoV-2 by FDA under an Emergency Use Authorization (EUA). This EUA will remain  in effect (meaning this test can be used) for the duration of the COVID-19 declaration under Section 56 4(b)(1) of the Act, 21 U.S.C. section 360bbb-3(b)(1), unless the authorization is terminated or revoked sooner. Performed at [facility], [place] [address] [place], [place] [address]      Liver Function Tests: Recent Labs  Lab 11/18/18 0430  AST 20  ALT 20  ALKPHOS 42  BILITOT 0.3  PROT 6.8  ALBUMIN 4.1   Recent Labs  Lab 11/18/18 0430  LIPASE 34   BNP  (last 3 results) Recent Labs    10/17/18 0423  BNP 19.4       Studies: No results found.   Admission status: Inpatient: Based on patients clinical presentation and evaluation of above clinical data, I have made determination that patient meets Inpatient criteria at this time.   [facility] Pager [phone]. If 7PM-7AM, please contact night-coverage at [email or web address], Office  [phone]  password [email or web address]  11/21/2018, 9:44 AM  LOS: 2 days

## 2018-11-21 NOTE — Progress Notes (Addendum)
[facility] Progress Note  2 Days Post-Op  Subjective: CC-  Wife at bedside.  Continues to have some intermittent crampy abdominal pain, but pain fairly well controlled. Started passing some flatus. Feels like he needs to have a bowel movement this morning. Mild bloating, denies n/v. Starting to feel hungry.  Objective: Vital signs in last 24 hours: Temp:  [98 F (36.7 C)-98.3 F (36.8 C)] 98 F (36.7 C) (11/08 0549) Pulse Rate:  [80-93] 80 (11/08 0549) Resp:  [16-18] 18 (11/08 0549) BP: (112-138)/(72-88) 130/88 (11/08 0549) SpO2:  [98 %-99 %] 99 % (11/08 0741) Weight:  [78.5 kg] 78.5 kg (11/08 0500)    Intake/Output from previous day: 11/07 0701 - 11/08 0700 In: 2135.9 [I.V.:1928.5; IV Piggyback:207.4] Out: 780 [Urine:700; Drains:80] Intake/Output this shift: No intake/output data recorded.  PE: Gen:  Alert, NAD, pleasant HEENT: EOM's intact, pupils equal and round Card:  RRR Pulm:  CTAB, no W/R/R, rate and effort normal Abd: Soft, mild distension, appropriately tender, few BS heard, midline incision cdi with honeycomb dressing in place and some dried blood noted on dressing, JP drain with serosanguinous output Skin: no rashes noted, warm and dry  Lab Results:  Recent Labs    11/20/18 0304 11/21/18 0526  WBC 7.5 6.5  HGB 12.8* 11.1*  HCT 41.2 35.2*  PLT 189 191   BMET Recent Labs    11/20/18 0304 11/21/18 0526  NA 135 137  K 4.5 4.0  CL 101 104  CO2 22 22  GLUCOSE 102* 82  BUN 6 7  CREATININE 1.15 1.10  CALCIUM 8.2* 8.2*   PT/INR No results for input(s): LABPROT, INR in the last 72 hours. CMP     Component Value Date/Time   NA 137 11/21/2018 0526   NA 141 10/11/2018 1115   K 4.0 11/21/2018 0526   CL 104 11/21/2018 0526   CO2 22 11/21/2018 0526   GLUCOSE 82 11/21/2018 0526   BUN 7 11/21/2018 0526   BUN 9 10/11/2018 1115   CREATININE 1.10 11/21/2018 0526   CALCIUM 8.2 (L) 11/21/2018 0526   PROT 6.8 11/18/2018 0430   PROT 7.1  10/11/2018 1115   ALBUMIN 4.1 11/18/2018 0430   ALBUMIN 4.4 10/11/2018 1115   AST 20 11/18/2018 0430   ALT 20 11/18/2018 0430   ALKPHOS 42 11/18/2018 0430   BILITOT 0.3 11/18/2018 0430   BILITOT 0.2 10/11/2018 1115   GFRNONAA >60 11/21/2018 0526   GFRAA >60 11/21/2018 0526   Lipase     Component Value Date/Time   LIPASE 34 11/18/2018 0430       Studies/Results: No results found.  Anti-infectives: Anti-infectives (From admission, onward)   Start     Dose/Rate Route Frequency Ordered Stop   11/19/18 1223  clindamycin (CLEOCIN) IVPB 900 mg     900 mg 100 mL/hr over 30 Minutes Intravenous 60 min pre-op 11/19/18 1223 11/19/18 1430   11/19/18 1223  gentamicin (GARAMYCIN) 390 mg in dextrose 5 % 100 mL IVPB     5 mg/kg  78.8 kg 109.8 mL/hr over 60 Minutes Intravenous 60 min pre-op 11/19/18 1223 11/19/18 1500       Assessment/Plan HTN  HLD Asthma - on daily prednisone? Will discuss with primary Thyroid disease Deaf  Right colon mass, colonic obstruction S/p Right colectomy 11/6 [name] - POD#2 - surgical path pending - JP drain SS, monitor; continue drain at least until eating - passing flatus - Advance to clear liquids. Continue mobilizing. Schedule PO tylenol. Labs  in AM.  ID - abx periop FEN - IVF, CLD VTE - SCDs, lovenox Foley - d/c 11/7 Follow up - [name]   LOS: 2 days    Wellington Hampshire, [facility] 11/21/2018, 8:12 AM Please see Amion for pager number during day hours 7:00am-4:30pm

## 2018-11-21 NOTE — Progress Notes (Signed)
Using stratus: Patient complaining of feeling hot, shaky. Thinks it was due to pain medication given at 2052 . I verified pain medication given and has been given it before without any reaction. Vitals normal and patient does not feel hot to me. Gave patient ice pack for neck and to eat. After 10 minutes, patient states he is feeling better after deep breathing exercises. Refused anxiety medication. Will continue to monitor.   Jackalyn Lombard

## 2018-11-22 ENCOUNTER — Inpatient Hospital Stay (HOSPITAL_COMMUNITY): Payer: Medicare Other

## 2018-11-22 LAB — BASIC METABOLIC PANEL
Anion gap: 12 (ref 5–15)
BUN: 7 mg/dL (ref 6–20)
CO2: 22 mmol/L (ref 22–32)
Calcium: 8.5 mg/dL — ABNORMAL LOW (ref 8.9–10.3)
Chloride: 104 mmol/L (ref 98–111)
Creatinine, Ser: 1.11 mg/dL (ref 0.61–1.24)
GFR calc Af Amer: >60 mL/min (ref >60)
GFR calc non Af Amer: >60 mL/min (ref >60)
Glucose, Bld: 110 mg/dL — ABNORMAL HIGH (ref 70–99)
Potassium: 3.7 mmol/L (ref 3.5–5.1)
Sodium: 138 mmol/L (ref 135–145)

## 2018-11-22 LAB — CBC
HCT: 37.6 % — ABNORMAL LOW (ref 39.0–52.0)
Hemoglobin: 11.8 g/dL — ABNORMAL LOW (ref 13.0–17.0)
MCH: 26.5 pg (ref 26.0–34.0)
MCHC: 31.4 g/dL (ref 30.0–36.0)
MCV: 84.3 fL (ref 80.0–100.0)
Platelets: 211 10*3/uL (ref 150–400)
RBC: 4.46 MIL/uL (ref 4.22–5.81)
RDW: 14.8 % (ref 11.5–15.5)
WBC: 6.3 10*3/uL (ref 4.0–10.5)
nRBC: 0 % (ref 0.0–0.2)

## 2018-11-22 LAB — MAGNESIUM: Magnesium: 2.2 mg/dL (ref 1.7–2.4)

## 2018-11-22 LAB — SURGICAL PATHOLOGY

## 2018-11-22 IMAGING — DX DG ABD PORTABLE 1V
2 series · 2 of 2 positions shown · non-contrast
Comparison: [DATE]

CLINICAL DATA: Abdominal pain

EXAM:
PORTABLE ABDOMEN - 1 VIEW

[abdomen kub (1 of 2)]
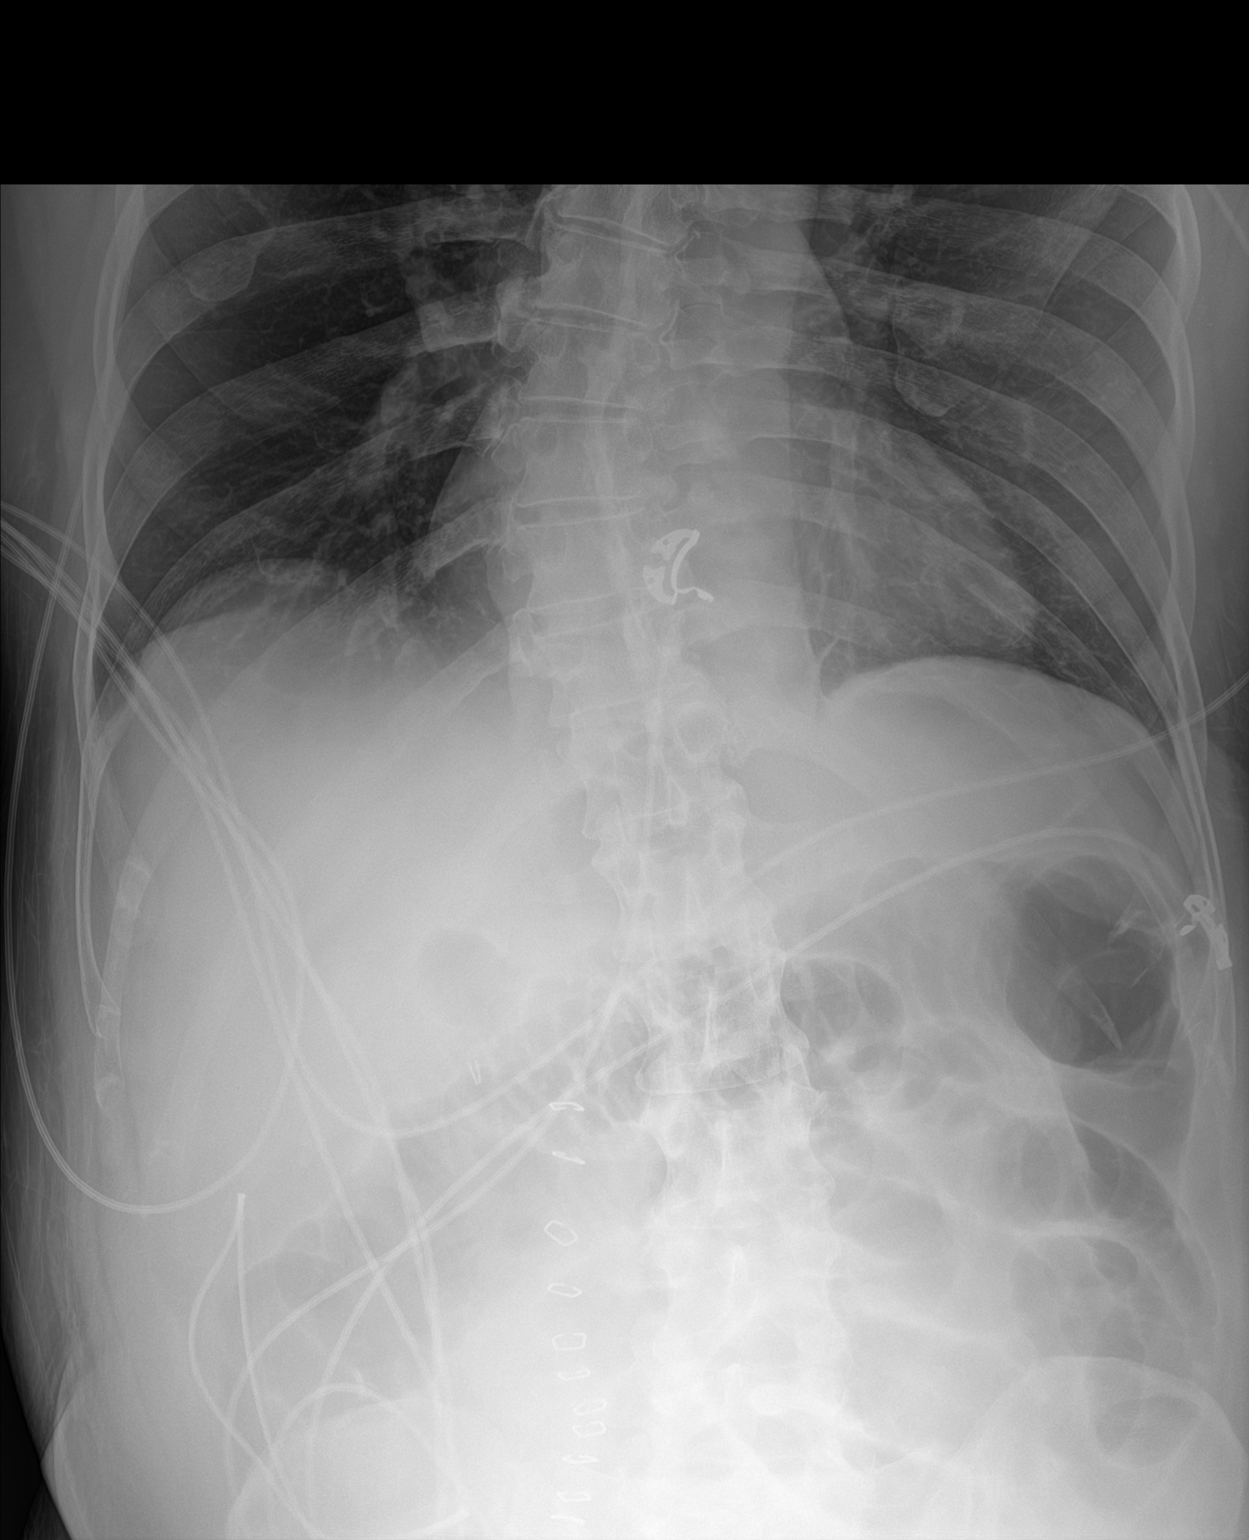

[abdomen kub (2 of 2)]
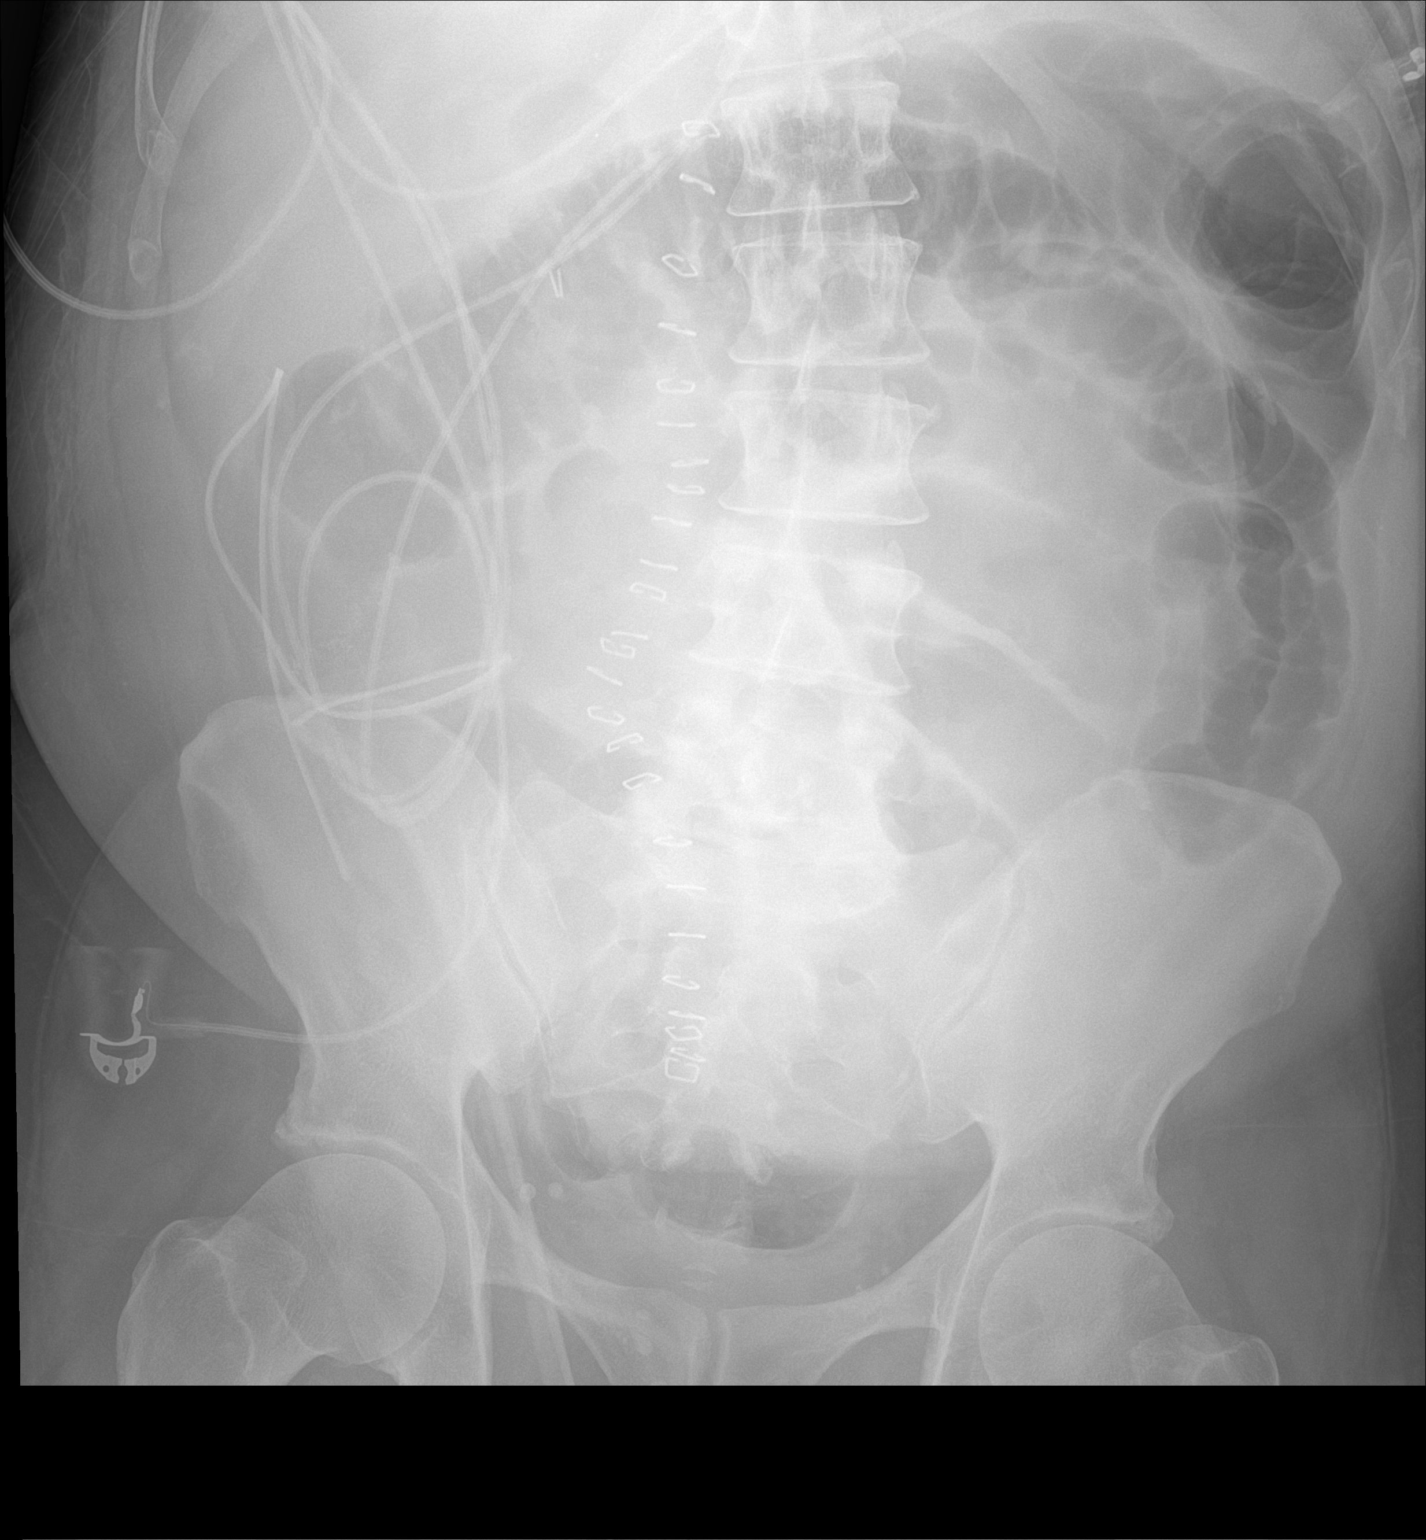

[2 of 2 positions shown; findings below may reference images not displayed]

FINDINGS: There are loops of gas distended colon and small bowel. Surgical
staples overlie the midline abdomen.
IMPRESSION: Gas distended small bowel and colon, likely indicating postoperative
adynamic ileus.

## 2018-11-22 IMAGING — DX DG ABD PORTABLE 1V
1 series · 1 of 1 positions shown · non-contrast
Comparison: Prior study same day.  CT [DATE].

CLINICAL DATA: NG tube placement.

EXAM:
PORTABLE ABDOMEN - 1 VIEW

[abdomen kub]
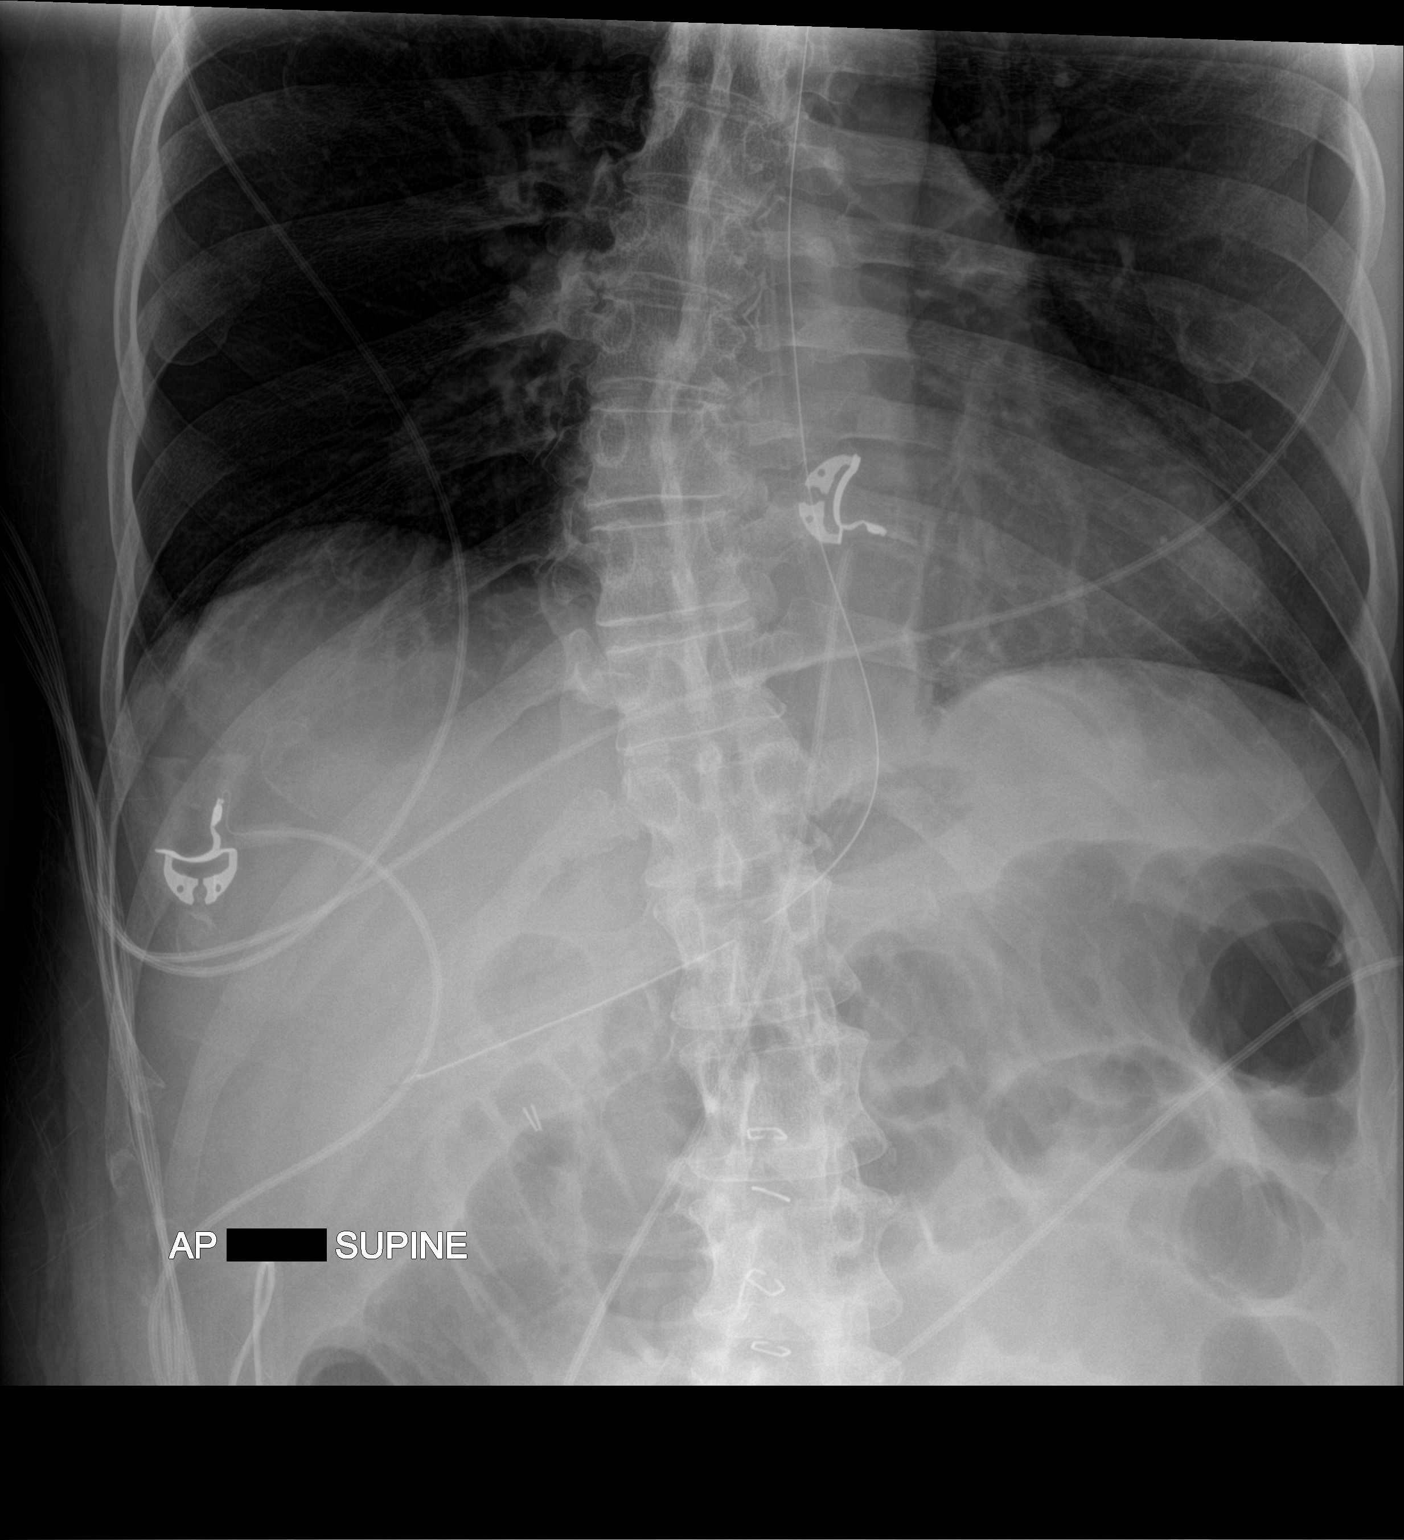

[1 of 1 positions shown; findings below may reference images not displayed]

FINDINGS: Interim placement of NG tube, its tip is over the distal stomach.
Unchanged small and large bowel distention. No free air.
IMPRESSION: 1. Interim placement of NG tube, its tip is over the distal stomach.

2.  Unchanged small and large bowel distention.

## 2018-11-22 MED ORDER — PROMETHAZINE HCL 25 MG/ML IJ SOLN
12.5000 mg | Freq: Four times a day (QID) | INTRAMUSCULAR | Status: AC | PRN
  Administered 2018-11-22 – 2018-11-24 (×2): 12.5 mg via INTRAVENOUS
  Filled 2018-11-22 (×2): qty 1

## 2018-11-22 MED ORDER — ACETAMINOPHEN 10 MG/ML IV SOLN
1000.0000 mg | Freq: Once | INTRAVENOUS | Status: AC
  Administered 2018-11-22: 05:00:00 1000 mg via INTRAVENOUS
  Filled 2018-11-22: qty 100

## 2018-11-22 MED ORDER — ACETAMINOPHEN 10 MG/ML IV SOLN
1000.0000 mg | Freq: Four times a day (QID) | INTRAVENOUS | Status: AC
  Administered 2018-11-22 – 2018-11-23 (×4): 1000 mg via INTRAVENOUS
  Filled 2018-11-22 (×5): qty 100

## 2018-11-22 NOTE — Care Management Important Message (Signed)
Important Message  Patient Details  Name: Alexander HARALDSON Sr. MRN: [id number] Date of Birth: 01/03/1966   Medicare Important Message Given:  Yes     Gregoire Bennis 11/22/2018, 3:14 PM

## 2018-11-22 NOTE — Progress Notes (Signed)
Patient complaining of increasing abdominal pain, nausea, and distention. Gave PRN IV Zofran for nausea. Paged [facility] for further orders.   Jackalyn Lombard

## 2018-11-22 NOTE — Progress Notes (Signed)
Patient ID: Alexander MASE Sr., male   DOB: 1965-10-11, 53 y.o.   MRN: [id number] Notified by RN of N/V. ABD x-ray looks likelpost-op ileus. Ordered NGT to LIWS. CBC, BMET, Mg pending this AM. Continue IVF.  Georganna Skeans, MD, MPH, FACS Please use [email or web address] to contact on call provider

## 2018-11-22 NOTE — Progress Notes (Signed)
Paged by bedside RN regarding increased Abd Pain and tachycardia. Ordered a ABD X-ray which revealed "Gas distended small bowel and colon, likely indicating postoperativeadynamic ileus." Pt made NPO and surgery paged for further recommendations. Arby Barrette NP

## 2018-11-22 NOTE — Progress Notes (Signed)
[facility] Progress Note  3 Days Post-Op  Subjective: CC-  Patient vomited once early this morning. Xray showed adynamic ileus. NG tube placed. Nose sore from tube but abdomen feeling a little better. Still bloated. Still passing a little gas and he had a small BM yesterday.  Objective: Vital signs in last 24 hours: Temp:  [97.6 F (36.4 C)-98.7 F (37.1 C)] 98.7 F (37.1 C) (11/09 0520) Pulse Rate:  [83-109] 109 (11/09 0520) Resp:  [16-18] 16 (11/09 0520) BP: (114-149)/(75-101) 114/75 (11/09 0520) SpO2:  [93 %-97 %] 93 % (11/09 0520) Weight:  [83.4 kg] 83.4 kg (11/09 0500) Last BM Date: 11/21/18  Intake/Output from previous day: 11/08 0701 - 11/09 0700 In: 5227 [P.O.:1200; I.V.:3734.4; IV Piggyback:292.6] Out: 60 [Emesis/NG output:30; Drains:30] Intake/Output this shift: No intake/output data recorded.  PE: Gen: Alert, NAD, pleasant HEENT: EOM's intact, pupils equal and round Card: RRR Pulm: CTAB, no W/R/R, rate andeffort normal Abd: Soft, distended, appropriately tender, tinkling bowel sounds,midline incision cdi with honeycomb dressing in place and some dried blood noted on dressing, JP drain with serosanguinous output Skin: no rashes noted, warm and dry   Lab Results:  Recent Labs    11/21/18 0526 11/22/18 0608  WBC 6.5 6.3  HGB 11.1* 11.8*  HCT 35.2* 37.6*  PLT 191 211   BMET Recent Labs    11/21/18 0526 11/22/18 0608  NA 137 138  K 4.0 3.7  CL 104 104  CO2 22 22  GLUCOSE 82 110*  BUN 7 7  CREATININE 1.10 1.11  CALCIUM 8.2* 8.5*   PT/INR No results for input(s): LABPROT, INR in the last 72 hours. CMP     Component Value Date/Time   NA 138 11/22/2018 0608   NA 141 10/11/2018 1115   K 3.7 11/22/2018 0608   CL 104 11/22/2018 0608   CO2 22 11/22/2018 0608   GLUCOSE 110 (H) 11/22/2018 0608   BUN 7 11/22/2018 0608   BUN 9 10/11/2018 1115   CREATININE 1.11 11/22/2018 0608   CALCIUM 8.5 (L) 11/22/2018 0608   PROT 6.8  11/18/2018 0430   PROT 7.1 10/11/2018 1115   ALBUMIN 4.1 11/18/2018 0430   ALBUMIN 4.4 10/11/2018 1115   AST 20 11/18/2018 0430   ALT 20 11/18/2018 0430   ALKPHOS 42 11/18/2018 0430   BILITOT 0.3 11/18/2018 0430   BILITOT 0.2 10/11/2018 1115   GFRNONAA >60 11/22/2018 0608   GFRAA >60 11/22/2018 0608   Lipase     Component Value Date/Time   LIPASE 34 11/18/2018 0430       Studies/Results: Dg Abd Portable 1 View  Result Date: 11/22/2018 CLINICAL DATA:  NG tube placement. EXAM: PORTABLE ABDOMEN - 1 VIEW COMPARISON:  Prior study same day.  CT 11/18/2018. FINDINGS: Interim placement of NG tube, its tip is over the distal stomach. Unchanged small and large bowel distention. No free air. IMPRESSION: 1. Interim placement of NG tube, its tip is over the distal stomach. 2.  Unchanged small and large bowel distention. Electronically Signed   By: Marcello Moores  Register   On: 11/22/2018 07:51   Dg Abd Portable 1v  Result Date: 11/22/2018 CLINICAL DATA:  Abdominal pain EXAM: PORTABLE ABDOMEN - 1 VIEW COMPARISON:  10/17/2018 FINDINGS: There are loops of gas distended colon and small bowel. Surgical staples overlie the midline abdomen. IMPRESSION: Gas distended small bowel and colon, likely indicating postoperative adynamic ileus. Electronically Signed   By: Ulyses Jarred M.D.   On: 11/22/2018 02:08  Anti-infectives: Anti-infectives (From admission, onward)   Start     Dose/Rate Route Frequency Ordered Stop   11/19/18 1223  clindamycin (CLEOCIN) IVPB 900 mg     900 mg 100 mL/hr over 30 Minutes Intravenous 60 min pre-op 11/19/18 1223 11/19/18 1430   11/19/18 1223  gentamicin (GARAMYCIN) 390 mg in dextrose 5 % 100 mL IVPB     5 mg/kg  78.8 kg 109.8 mL/hr over 60 Minutes Intravenous 60 min pre-op 11/19/18 1223 11/19/18 1500       Assessment/Plan HTN  HLD Asthma - TRH tapering prednisone Thyroid disease Deaf  Right colon mass, colonic obstruction S/pRight colectomy11/6 Dr.  Donne Hazel - POD#3 - surgical path pending - JP drain SS, monitor; continue drain at least until eating - NG tube placed this AM for ileus - Continue NPO/NGT to LIWS. Mobilize. Monitor electrolytes. Change scheduled tylenol/robaxin to IV.  ID -abx periop FEN -IVF, NPO/NGT to LIWS VTE -SCDs, lovenox Foley -d/c 11/7 Follow up -[name]   LOS: 3 days    Wellington Hampshire, [facility] 11/22/2018, 8:26 AM Please see Amion for pager number during day hours 7:00am-4:30pm

## 2018-11-22 NOTE — Progress Notes (Signed)
Patient vomited x1. Awaiting recommendations from surgery.   Alexander Reeves

## 2018-11-22 NOTE — Progress Notes (Signed)
PROGRESS NOTE    [name]  [id number] DOB: [birth year] DOA: 11/18/2018 PCP: [name], NP     Brief Narrative:  Alexander Reeves Sr. is a 53 year old male with a history of asthma, who is speech and hearing impaired and communicates with a sign language came to hospital with 1 day history of right lower quadrant abdominal pain.  CT of the abdomen pelvis showed mid and mid to distal ascending colon wall thickening concerning for neoplasm of the right colon.  General surgery and gastroenterology were consulted.  Patient underwent right colectomy for right colon mass on 11/19/2018.   New events last 24 hours / Subjective: Had episode of abdominal pain and vomiting.  Abdominal x-ray revealed postop adynamic ileus and patient had NG tube placed.  Patient is evaluated with ALS interpreter via iPad.  He states that his abdomen feels distended.  Has not passed gas today.  Continues to feel some nausea and had an episode of vomiting this morning.  Assessment & Plan:   Active Problems:   Abdominal pain   Abnormal CT scan, colon   Malignant neoplasm of ascending colon (HCC)   Colon cancer (HCC)   Right colon mass  -Status post right colectomy 11/6 by [name] -Pathology pending -General surgery following  Postop ileus -NG tube in place -NPO -IVF   Asthma -Without acute exacerbation   DVT prophylaxis: Lovenox Code Status: Full code Family Communication: None at bedside Disposition Plan: Pending clinical improvement, pathology pending   Consultants:   GI  General surgery   Antimicrobials:  Anti-infectives (From admission, onward)   Start     Dose/Rate Route Frequency Ordered Stop   11/19/18 1223  clindamycin (CLEOCIN) IVPB 900 mg     900 mg 100 mL/hr over 30 Minutes Intravenous 60 min pre-op 11/19/18 1223 11/19/18 1430   11/19/18 1223  gentamicin (GARAMYCIN) 390 mg in dextrose 5 % 100 mL IVPB     5 mg/kg  78.8 kg 109.8 mL/hr over 60 Minutes  Intravenous 60 min pre-op 11/19/18 1223 11/19/18 1500        Objective: Vitals:   11/21/18 2343 11/22/18 0500 11/22/18 0520 11/22/18 1241  BP: (!) 138/95  114/75 134/88  Pulse: (!) 107  (!) 109 94  Resp: 18  16 16   Temp: 97.8 F (36.6 C)  98.7 F (37.1 C) 98.4 F (36.9 C)  TempSrc: Oral  Axillary Oral  SpO2: 97%  93% 97%  Weight:  83.4 kg    Height:        Intake/Output Summary (Last 24 hours) at 11/22/2018 1358 Last data filed at 11/22/2018 0934 Gross per 24 hour  Intake 2642.22 ml  Output 860 ml  Net 1782.22 ml   Filed Weights   11/18/18 2140 11/21/18 0500 11/22/18 0500  Weight: 78.8 kg 78.5 kg 83.4 kg    Examination:  General exam: Appears calm and comfortable  Respiratory system: Clear to auscultation. Respiratory effort normal. No respiratory distress. No conversational dyspnea.  Cardiovascular system: S1 & S2 heard, RRR. No murmurs. No pedal edema. Gastrointestinal system: Abdomen is distended with NG tube in place.  Nontender to palpation Central nervous system: Alert and oriented. No focal neurological deficits. Speech clear.  Extremities: Symmetric in appearance  Skin: No rashes, lesions or ulcers on exposed skin  Psychiatry: Judgement and insight appear normal. Mood & affect appropriate.   Data Reviewed: I have personally reviewed following labs and imaging studies  CBC: Recent Labs  Lab 11/18/18 0430  11/20/18 0304 11/21/18 0526 11/22/18 0608  WBC 5.1 7.5 6.5 6.3  HGB 13.3 12.8* 11.1* 11.8*  HCT 42.5 41.2 35.2* 37.6*  MCV 85.2 84.4 84.4 84.3  PLT 190 189 191 [id number]   Basic Metabolic Panel: Recent Labs  Lab 11/18/18 0430 11/20/18 0304 11/21/18 0526 11/22/18 0608  NA 141 135 137 138  K 3.8 4.5 4.0 3.7  CL 107 101 104 104  CO2 25 22 22 22   GLUCOSE 107* 102* 82 110*  BUN 9 6 7 7   CREATININE 1.24 1.15 1.10 1.11  CALCIUM 9.1 8.2* 8.2* 8.5*  MG  --   --  2.1 2.2   GFR: Estimated Creatinine Clearance: 81.3 mL/min (by C-G formula based on SCr  of 1.11 mg/dL). Liver Function Tests: Recent Labs  Lab 11/18/18 0430  AST 20  ALT 20  ALKPHOS 42  BILITOT 0.3  PROT 6.8  ALBUMIN 4.1   Recent Labs  Lab 11/18/18 0430  LIPASE 34   No results for input(s): AMMONIA in the last 168 hours. Coagulation Profile: No results for input(s): INR, PROTIME in the last 168 hours. Cardiac Enzymes: No results for input(s): CKTOTAL, CKMB, CKMBINDEX, TROPONINI in the last 168 hours. BNP (last 3 results) No results for input(s): PROBNP in the last 8760 hours. HbA1C: No results for input(s): HGBA1C in the last 72 hours. CBG: No results for input(s): GLUCAP in the last 168 hours. Lipid Profile: No results for input(s): CHOL, HDL, LDLCALC, TRIG, CHOLHDL, LDLDIRECT in the last 72 hours. Thyroid Function Tests: No results for input(s): TSH, T4TOTAL, FREET4, T3FREE, THYROIDAB in the last 72 hours. Anemia Panel: No results for input(s): VITAMINB12, FOLATE, FERRITIN, TIBC, IRON, RETICCTPCT in the last 72 hours. Sepsis Labs: No results for input(s): PROCALCITON, LATICACIDVEN in the last 168 hours.  Recent Results (from the past 240 hour(s))  SARS CORONAVIRUS 2 (TAT 6-24 HRS) Nasopharyngeal Nasopharyngeal Swab     Status: None   Collection Time: 11/18/18  2:10 PM   Specimen: Nasopharyngeal Swab  Result Value Ref Range Status   SARS Coronavirus 2 NEGATIVE NEGATIVE Final    Comment: (NOTE) SARS-CoV-2 target nucleic acids are NOT DETECTED. The SARS-CoV-2 RNA is generally detectable in upper and lower respiratory specimens during the acute phase of infection. Negative results do not preclude SARS-CoV-2 infection, do not rule out co-infections with other pathogens, and should not be used as the sole basis for treatment or other patient management decisions. Negative results must be combined with clinical observations, patient history, and epidemiological information. The expected result is Negative. Fact Sheet for Patients:  SugarRoll.be Fact Sheet for Healthcare Providers: https://www.woods-mathews.com/ This test is not yet approved or cleared by the [place] FDA and  has been authorized for detection and/or diagnosis of SARS-CoV-2 by FDA under an Emergency Use Authorization (EUA). This EUA will remain  in effect (meaning this test can be used) for the duration of the COVID-19 declaration under Section 56 4(b)(1) of the Act, 21 U.S.C. section 360bbb-3(b)(1), unless the authorization is terminated or revoked sooner. Performed at [facility], [place] [address], [place], [place] [address]       Radiology Studies: Dg Abd Portable 1 View  Result Date: 11/22/2018 CLINICAL DATA:  NG tube placement. EXAM: PORTABLE ABDOMEN - 1 VIEW COMPARISON:  Prior study same day.  CT 11/18/2018. FINDINGS: Interim placement of NG tube, its tip is over the distal stomach. Unchanged small and large bowel distention. No free air. IMPRESSION: 1. Interim placement of NG tube,  its tip is over the distal stomach. 2.  Unchanged small and large bowel distention. Electronically Signed   By: Marcello Moores  Register   On: 11/22/2018 07:51   Dg Abd Portable 1v  Result Date: 11/22/2018 CLINICAL DATA:  Abdominal pain EXAM: PORTABLE ABDOMEN - 1 VIEW COMPARISON:  10/17/2018 FINDINGS: There are loops of gas distended colon and small bowel. Surgical staples overlie the midline abdomen. IMPRESSION: Gas distended small bowel and colon, likely indicating postoperative adynamic ileus. Electronically Signed   By: Ulyses Jarred M.D.   On: 11/22/2018 02:08      Scheduled Meds: . Chlorhexidine Gluconate Cloth  6 each Topical Daily  . enoxaparin (LOVENOX) injection  40 mg Subcutaneous Q24H  . fluticasone furoate-vilanterol  1 puff Inhalation Daily  . loratadine  10 mg Oral Daily  . montelukast  10 mg Oral QHS  . sodium chloride flush  3 mL Intravenous Once   Continuous Infusions: . sodium chloride 100 mL/hr  at 11/22/18 0600  . acetaminophen 1,000 mg (11/22/18 0951)  . methocarbamol (ROBAXIN) IV Stopped (11/22/18 0545)     LOS: 3 days      Time spent: 35 minutes   Dessa Phi, DO [facility] 11/22/2018, 1:58 PM   Available via Epic secure chat 7am-7pm After these hours, please refer to coverage provider listed on [email or web address]

## 2018-11-23 ENCOUNTER — Other Ambulatory Visit: Payer: Self-pay | Admitting: Oncology

## 2018-11-23 ENCOUNTER — Encounter: Payer: Self-pay | Admitting: Nurse Practitioner

## 2018-11-23 DIAGNOSIS — C189 Malignant neoplasm of colon, unspecified: Secondary | ICD-10-CM

## 2018-11-23 LAB — BASIC METABOLIC PANEL
Anion gap: 11 (ref 5–15)
BUN: 8 mg/dL (ref 6–20)
CO2: 22 mmol/L (ref 22–32)
Calcium: 8 mg/dL — ABNORMAL LOW (ref 8.9–10.3)
Chloride: 107 mmol/L (ref 98–111)
Creatinine, Ser: 1.16 mg/dL (ref 0.61–1.24)
GFR calc Af Amer: >60 mL/min (ref >60)
GFR calc non Af Amer: >60 mL/min (ref >60)
Glucose, Bld: 75 mg/dL (ref 70–99)
Potassium: 3.6 mmol/L (ref 3.5–5.1)
Sodium: 140 mmol/L (ref 135–145)

## 2018-11-23 LAB — CBC
HCT: 33.8 % — ABNORMAL LOW (ref 39.0–52.0)
Hemoglobin: 10.6 g/dL — ABNORMAL LOW (ref 13.0–17.0)
MCH: 26.3 pg (ref 26.0–34.0)
MCHC: 31.4 g/dL (ref 30.0–36.0)
MCV: 83.9 fL (ref 80.0–100.0)
Platelets: 196 10*3/uL (ref 150–400)
RBC: 4.03 MIL/uL — ABNORMAL LOW (ref 4.22–5.81)
RDW: 14.8 % (ref 11.5–15.5)
WBC: 5.8 10*3/uL (ref 4.0–10.5)
nRBC: 0 % (ref 0.0–0.2)

## 2018-11-23 LAB — MAGNESIUM: Magnesium: 2 mg/dL (ref 1.7–2.4)

## 2018-11-23 LAB — PREALBUMIN: Prealbumin: 12.7 mg/dL — ABNORMAL LOW (ref 18–38)

## 2018-11-23 MED ORDER — POTASSIUM CHLORIDE IN NACL 20-0.9 MEQ/L-% IV SOLN
INTRAVENOUS | Status: DC
  Administered 2018-11-23 – 2018-12-02 (×12): via INTRAVENOUS
  Filled 2018-11-23 (×15): qty 1000

## 2018-11-23 MED ORDER — POTASSIUM CHLORIDE 10 MEQ/100ML IV SOLN
10.0000 meq | INTRAVENOUS | Status: AC
  Administered 2018-11-23 (×2): 10 meq via INTRAVENOUS
  Filled 2018-11-23 (×2): qty 100

## 2018-11-23 MED ORDER — ACETAMINOPHEN 10 MG/ML IV SOLN
1000.0000 mg | Freq: Four times a day (QID) | INTRAVENOUS | Status: AC
  Administered 2018-11-23 (×3): 1000 mg via INTRAVENOUS
  Filled 2018-11-23 (×4): qty 100

## 2018-11-23 MED ORDER — SODIUM CHLORIDE 0.9 % IV SOLN: INTRAVENOUS | Status: DC

## 2018-11-23 NOTE — Progress Notes (Signed)
[facility] Progress Note  4 Days Post-Op  Subjective: CC-  Feeling a little better than yesterday. Abdomen somewhat less distended. Passed small amount of flatus this AM. No BM. 2100cc out from NG tube last 24 hours.  Objective: Vital signs in last 24 hours: Temp:  [97.7 F (36.5 C)-98.4 F (36.9 C)] 97.7 F (36.5 C) (11/10 0533) Pulse Rate:  [92-104] 92 (11/10 0533) Resp:  [16-18] 18 (11/10 0533) BP: (134-137)/(79-88) 137/81 (11/10 0533) SpO2:  [97 %-99 %] 99 % (11/10 0533) Last BM Date: 11/21/18(very small, per report)  Intake/Output from previous day: 11/09 0701 - 11/10 0700 In: 2617.2 [I.V.:2037.3; IV Piggyback:580] Out: 2350 [Emesis/NG output:2100; Drains:250] Intake/Output this shift: No intake/output data recorded.  PE: Gen: Alert, NAD, pleasant HEENT: EOM's intact, pupils equal and round Card: RRR Pulm: CTAB, no W/R/R, rate andeffort normal Abd: Soft, distended but improved from yesterday, appropriately tender, tinkling bowel sounds,midline incision cdi with honeycomb dressing in place and some dried blood noted on dressing, JP drain with serosanguinous output Skin: no rashes noted, warm and dry    Lab Results:  Recent Labs    11/22/18 0608 11/23/18 0533  WBC 6.3 5.8  HGB 11.8* 10.6*  HCT 37.6* 33.8*  PLT 211 196   BMET Recent Labs    11/22/18 0608 11/23/18 0533  NA 138 140  K 3.7 3.6  CL 104 107  CO2 22 22  GLUCOSE 110* 75  BUN 7 8  CREATININE 1.11 1.16  CALCIUM 8.5* 8.0*   PT/INR No results for input(s): LABPROT, INR in the last 72 hours. CMP     Component Value Date/Time   NA 140 11/23/2018 0533   NA 141 10/11/2018 1115   K 3.6 11/23/2018 0533   CL 107 11/23/2018 0533   CO2 22 11/23/2018 0533   GLUCOSE 75 11/23/2018 0533   BUN 8 11/23/2018 0533   BUN 9 10/11/2018 1115   CREATININE 1.16 11/23/2018 0533   CALCIUM 8.0 (L) 11/23/2018 0533   PROT 6.8 11/18/2018 0430   PROT 7.1 10/11/2018 1115   ALBUMIN 4.1 11/18/2018  0430   ALBUMIN 4.4 10/11/2018 1115   AST 20 11/18/2018 0430   ALT 20 11/18/2018 0430   ALKPHOS 42 11/18/2018 0430   BILITOT 0.3 11/18/2018 0430   BILITOT 0.2 10/11/2018 1115   GFRNONAA >60 11/23/2018 0533   GFRAA >60 11/23/2018 0533   Lipase     Component Value Date/Time   LIPASE 34 11/18/2018 0430       Studies/Results: Dg Abd Portable 1 View  Result Date: 11/22/2018 CLINICAL DATA:  NG tube placement. EXAM: PORTABLE ABDOMEN - 1 VIEW COMPARISON:  Prior study same day.  CT 11/18/2018. FINDINGS: Interim placement of NG tube, its tip is over the distal stomach. Unchanged small and large bowel distention. No free air. IMPRESSION: 1. Interim placement of NG tube, its tip is over the distal stomach. 2.  Unchanged small and large bowel distention. Electronically Signed   By: Marcello Moores  Register   On: 11/22/2018 07:51   Dg Abd Portable 1v  Result Date: 11/22/2018 CLINICAL DATA:  Abdominal pain EXAM: PORTABLE ABDOMEN - 1 VIEW COMPARISON:  10/17/2018 FINDINGS: There are loops of gas distended colon and small bowel. Surgical staples overlie the midline abdomen. IMPRESSION: Gas distended small bowel and colon, likely indicating postoperative adynamic ileus. Electronically Signed   By: Ulyses Jarred M.D.   On: 11/22/2018 02:08    Anti-infectives: Anti-infectives (From admission, onward)   Start  Dose/Rate Route Frequency Ordered Stop   11/19/18 1223  clindamycin (CLEOCIN) IVPB 900 mg     900 mg 100 mL/hr over 30 Minutes Intravenous 60 min pre-op 11/19/18 1223 11/19/18 1430   11/19/18 1223  gentamicin (GARAMYCIN) 390 mg in dextrose 5 % 100 mL IVPB     5 mg/kg  78.8 kg 109.8 mL/hr over 60 Minutes Intravenous 60 min pre-op 11/19/18 1223 11/19/18 1500       Assessment/Plan HTN  HLD Asthma- TRH tapering prednisone Thyroid disease Deaf  Right colon mass, colonic obstruction S/pRight colectomy11/6 [name] - POD#4 - surgical path pending - JP drain SS, monitor; continue  drain at least until eating - Continue NGT/NPO and await return in bowel function. Replace potassium. Continue mobilizing. Continue IV tylenol. Plan to remove honeycomb tomorrow.  ID -abx periop FEN -IVF,NPO except ice chips/NGT to LIWS VTE -SCDs, lovenox Foley -d/c 11/7 Follow up -[name]    LOS: 4 days    Wellington Hampshire, [facility] 11/23/2018, 7:54 AM Please see Amion for pager number during day hours 7:00am-4:30pm

## 2018-11-23 NOTE — Progress Notes (Signed)
Message sent to Drucie Ip requesting assistance with transportation to initial consult 11/25 and to future appointments.

## 2018-11-23 NOTE — Progress Notes (Signed)
Brief Oncology Note:  I was notified by the hospitalist of the patient's new diagnosis of invasive adenocarcinoma of the ascending colon.  Status post right colectomy for an obstructive mass on 11/19/2018.  Final pathology is still pending.  CEA performed on 11/18/2018 was normal at 2.7.  I have placed a referral to the cancer center and have spoken with the GI navigator regarding this patient.  With use of the video ASL interpreter, I have spoken with the patient and his wife regarding need for follow-up at the [facility].  We discussed that at this appointment, the final pathology will be reviewed and treatment recommendations will be discussed.  His appointment has been scheduled for 12/08/2018 at 10:15 AM.  The patient and his wife were given the telephone number as well as the address of the cancer center.  They expressed some difficulty with transportation.  I will work with the cancer center to arrange transportation to his visit at our facility.  They state the best way to communicate with them is via video phone.  The phone number for their video phone is [phone].  Mikey Bussing, DNP, AGPCNP-BC, AOCNP

## 2018-11-23 NOTE — Progress Notes (Signed)
PROGRESS NOTE    Kaydence Reeves  [id number] DOB: 06-Apr-1965 DOA: 11/18/2018 PCP: [name], NP     Brief Narrative:  Alexander TYRONE Sr. is a 53 year old male with a history of asthma, who is speech and hearing impaired and communicates with a sign language came to hospital with 1 day history of right lower quadrant abdominal pain.  CT of the abdomen pelvis showed mid and mid to distal ascending colon wall thickening concerning for neoplasm of the right colon.  General surgery and gastroenterology were consulted.  Patient underwent right colectomy for right colon mass on 11/19/2018.   New events last 24 hours / Subjective: Patient with family at bedside.  ALS interpreter used via iPad.  He states that since NG tube placement, his abdomen feels much better.  Has not passed any gas or bowel movement.  No further nausea or vomiting noted.  States that he feels hungry.  Assessment & Plan:   Active Problems:   Abdominal pain   Abnormal CT scan, colon   Malignant neoplasm of ascending colon (HCC)   Colon cancer (HCC)   Right colon mass  -Status post right colectomy 11/6 by [name] -Surgical pathology from colonoscopy revealed invasive adenocarcinoma -Surgical pathology from laparoscopic colectomy pending -General surgery following  Postop ileus -NG tube in place -NPO -IVF   Asthma -Without acute exacerbation   DVT prophylaxis: Lovenox Code Status: Full code Family Communication: At bedside Disposition Plan: Pending clinical improvement, final pathology pending   Consultants:   GI  General surgery   Antimicrobials:  Anti-infectives (From admission, onward)   Start     Dose/Rate Route Frequency Ordered Stop   11/19/18 1223  clindamycin (CLEOCIN) IVPB 900 mg     900 mg 100 mL/hr over 30 Minutes Intravenous 60 min pre-op 11/19/18 1223 11/19/18 1430   11/19/18 1223  gentamicin (GARAMYCIN) 390 mg in dextrose 5 % 100 mL IVPB     5 mg/kg  78.8 kg  109.8 mL/hr over 60 Minutes Intravenous 60 min pre-op 11/19/18 1223 11/19/18 1500       Objective: Vitals:   11/22/18 0520 11/22/18 1241 11/22/18 1758 11/23/18 0533  BP: 114/75 134/88 134/79 137/81  Pulse: (!) 109 94 (!) 104 92  Resp: 16 16 16 18   Temp: 98.7 F (37.1 C) 98.4 F (36.9 C) 98.4 F (36.9 C) 97.7 F (36.5 C)  TempSrc: Axillary Oral Oral Oral  SpO2: 93% 97% 97% 99%  Weight:      Height:        Intake/Output Summary (Last 24 hours) at 11/23/2018 1032 Last data filed at 11/23/2018 0913 Gross per 24 hour  Intake 2737.23 ml  Output 1550 ml  Net 1187.23 ml   Filed Weights   11/18/18 2140 11/21/18 0500 11/22/18 0500  Weight: 78.8 kg 78.5 kg 83.4 kg    Examination: General exam: Appears calm and comfortable  Respiratory system: Clear to auscultation. Respiratory effort normal. Cardiovascular system: S1 & S2 heard, RRR. No pedal edema. Gastrointestinal system: Abdomen is mildly distended but soft without tenderness palpation. Central nervous system: Alert and oriented. Non focal exam. Speech clear  Extremities: Symmetric in appearance bilaterally  Skin: No rashes, lesions or ulcers on exposed skin  Psychiatry: Judgement and insight appear stable. Mood & affect appropriate.   Data Reviewed: I have personally reviewed following labs and imaging studies  CBC: Recent Labs  Lab 11/18/18 0430 11/20/18 0304 11/21/18 0526 11/22/18 0608 11/23/18 0533  WBC 5.1 7.5 6.5  6.3 5.8  HGB 13.3 12.8* 11.1* 11.8* 10.6*  HCT 42.5 41.2 35.2* 37.6* 33.8*  MCV 85.2 84.4 84.4 84.3 83.9  PLT 190 189 191 211 [id number]   Basic Metabolic Panel: Recent Labs  Lab 11/18/18 0430 11/20/18 0304 11/21/18 0526 11/22/18 0608 11/23/18 0533  NA 141 135 137 138 140  K 3.8 4.5 4.0 3.7 3.6  CL 107 101 104 104 107  CO2 25 22 22 22 22   GLUCOSE 107* 102* 82 110* 75  BUN 9 6 7 7 8   CREATININE 1.24 1.15 1.10 1.11 1.16  CALCIUM 9.1 8.2* 8.2* 8.5* 8.0*  MG  --   --  2.1 2.2 2.0   GFR:  Estimated Creatinine Clearance: 77.8 mL/min (by C-G formula based on SCr of 1.16 mg/dL). Liver Function Tests: Recent Labs  Lab 11/18/18 0430  AST 20  ALT 20  ALKPHOS 42  BILITOT 0.3  PROT 6.8  ALBUMIN 4.1   Recent Labs  Lab 11/18/18 0430  LIPASE 34   No results for input(s): AMMONIA in the last 168 hours. Coagulation Profile: No results for input(s): INR, PROTIME in the last 168 hours. Cardiac Enzymes: No results for input(s): CKTOTAL, CKMB, CKMBINDEX, TROPONINI in the last 168 hours. BNP (last 3 results) No results for input(s): PROBNP in the last 8760 hours. HbA1C: No results for input(s): HGBA1C in the last 72 hours. CBG: No results for input(s): GLUCAP in the last 168 hours. Lipid Profile: No results for input(s): CHOL, HDL, LDLCALC, TRIG, CHOLHDL, LDLDIRECT in the last 72 hours. Thyroid Function Tests: No results for input(s): TSH, T4TOTAL, FREET4, T3FREE, THYROIDAB in the last 72 hours. Anemia Panel: No results for input(s): VITAMINB12, FOLATE, FERRITIN, TIBC, IRON, RETICCTPCT in the last 72 hours. Sepsis Labs: No results for input(s): PROCALCITON, LATICACIDVEN in the last 168 hours.  Recent Results (from the past 240 hour(s))  SARS CORONAVIRUS 2 (TAT 6-24 HRS) Nasopharyngeal Nasopharyngeal Swab     Status: None   Collection Time: 11/18/18  2:10 PM   Specimen: Nasopharyngeal Swab  Result Value Ref Range Status   SARS Coronavirus 2 NEGATIVE NEGATIVE Final    Comment: (NOTE) SARS-CoV-2 target nucleic acids are NOT DETECTED. The SARS-CoV-2 RNA is generally detectable in upper and lower respiratory specimens during the acute phase of infection. Negative results do not preclude SARS-CoV-2 infection, do not rule out co-infections with other pathogens, and should not be used as the sole basis for treatment or other patient management decisions. Negative results must be combined with clinical observations, patient history, and epidemiological information. The  expected result is Negative. Fact Sheet for Patients: SugarRoll.be Fact Sheet for Healthcare Providers: https://www.woods-mathews.com/ This test is not yet approved or cleared by the [place] FDA and  has been authorized for detection and/or diagnosis of SARS-CoV-2 by FDA under an Emergency Use Authorization (EUA). This EUA will remain  in effect (meaning this test can be used) for the duration of the COVID-19 declaration under Section 56 4(b)(1) of the Act, 21 U.S.C. section 360bbb-3(b)(1), unless the authorization is terminated or revoked sooner. Performed at [facility], [place] [address], [place], [place] [address]       Radiology Studies: Dg Abd Portable 1 View  Result Date: 11/22/2018 CLINICAL DATA:  NG tube placement. EXAM: PORTABLE ABDOMEN - 1 VIEW COMPARISON:  Prior study same day.  CT 11/18/2018. FINDINGS: Interim placement of NG tube, its tip is over the distal stomach. Unchanged small and large bowel distention. No free air. IMPRESSION: 1.  Interim placement of NG tube, its tip is over the distal stomach. 2.  Unchanged small and large bowel distention. Electronically Signed   By: Marcello Moores  Register   On: 11/22/2018 07:51   Dg Abd Portable 1v  Result Date: 11/22/2018 CLINICAL DATA:  Abdominal pain EXAM: PORTABLE ABDOMEN - 1 VIEW COMPARISON:  10/17/2018 FINDINGS: There are loops of gas distended colon and small bowel. Surgical staples overlie the midline abdomen. IMPRESSION: Gas distended small bowel and colon, likely indicating postoperative adynamic ileus. Electronically Signed   By: Ulyses Jarred M.D.   On: 11/22/2018 02:08      Scheduled Meds: . Chlorhexidine Gluconate Cloth  6 each Topical Daily  . enoxaparin (LOVENOX) injection  40 mg Subcutaneous Q24H  . fluticasone furoate-vilanterol  1 puff Inhalation Daily  . loratadine  10 mg Oral Daily  . montelukast  10 mg Oral QHS  . sodium chloride flush  3 mL Intravenous  Once   Continuous Infusions: . 0.9 % NaCl with KCl 20 mEq / L 100 mL/hr at 11/23/18 0911  . acetaminophen 1,000 mg (11/23/18 0913)  . methocarbamol (ROBAXIN) IV 500 mg (11/23/18 0531)     LOS: 4 days      Time spent: 25 minutes   Dessa Phi, DO [facility] 11/23/2018, 10:32 AM   Available via Epic secure chat 7am-7pm After these hours, please refer to coverage provider listed on [email or web address]

## 2018-11-23 NOTE — Progress Notes (Signed)
Received notification about this patient and new diagnosis of colon cancer. I have sent a scheduling message to arrange for a new patient appointment at the [facility] after discharge and have also notified the GI Navigator. I will see the patient this afternoon when rounding at [facility].   Mikey Bussing, DNP, AGPCNP-BC, AOCNP

## 2018-11-24 DIAGNOSIS — C189 Malignant neoplasm of colon, unspecified: Secondary | ICD-10-CM

## 2018-11-24 LAB — MAGNESIUM: Magnesium: 2 mg/dL (ref 1.7–2.4)

## 2018-11-24 LAB — CBC
HCT: 33.2 % — ABNORMAL LOW (ref 39.0–52.0)
Hemoglobin: 10.3 g/dL — ABNORMAL LOW (ref 13.0–17.0)
MCH: 26.3 pg (ref 26.0–34.0)
MCHC: 31 g/dL (ref 30.0–36.0)
MCV: 84.7 fL (ref 80.0–100.0)
Platelets: 230 10*3/uL (ref 150–400)
RBC: 3.92 MIL/uL — ABNORMAL LOW (ref 4.22–5.81)
RDW: 14.8 % (ref 11.5–15.5)
WBC: 7.4 10*3/uL (ref 4.0–10.5)
nRBC: 0 % (ref 0.0–0.2)

## 2018-11-24 LAB — BASIC METABOLIC PANEL
Anion gap: 13 (ref 5–15)
BUN: 7 mg/dL (ref 6–20)
CO2: 21 mmol/L — ABNORMAL LOW (ref 22–32)
Calcium: 8.2 mg/dL — ABNORMAL LOW (ref 8.9–10.3)
Chloride: 106 mmol/L (ref 98–111)
Creatinine, Ser: 1.09 mg/dL (ref 0.61–1.24)
GFR calc Af Amer: >60 mL/min (ref >60)
GFR calc non Af Amer: >60 mL/min (ref >60)
Glucose, Bld: 74 mg/dL (ref 70–99)
Potassium: 3.8 mmol/L (ref 3.5–5.1)
Sodium: 140 mmol/L (ref 135–145)

## 2018-11-24 MED ORDER — ORAL CARE MOUTH RINSE
15.0000 mL | Freq: Two times a day (BID) | OROMUCOSAL | Status: DC
  Administered 2018-11-24 – 2018-12-02 (×10): 15 mL via OROMUCOSAL

## 2018-11-24 MED ORDER — CHLORHEXIDINE GLUCONATE 0.12 % MT SOLN
15.0000 mL | Freq: Two times a day (BID) | OROMUCOSAL | Status: DC
  Administered 2018-11-24 – 2018-12-02 (×12): 15 mL via OROMUCOSAL
  Filled 2018-11-24 (×19): qty 15

## 2018-11-24 MED ORDER — ACETAMINOPHEN 10 MG/ML IV SOLN
1000.0000 mg | Freq: Four times a day (QID) | INTRAVENOUS | Status: AC
  Administered 2018-11-24 – 2018-11-25 (×4): 1000 mg via INTRAVENOUS
  Filled 2018-11-24 (×4): qty 100

## 2018-11-24 MED ORDER — PANTOPRAZOLE SODIUM 40 MG IV SOLR
40.0000 mg | INTRAVENOUS | Status: DC
  Administered 2018-11-24 – 2018-11-28 (×5): 40 mg via INTRAVENOUS
  Filled 2018-11-24 (×5): qty 40

## 2018-11-24 NOTE — Progress Notes (Signed)
PROGRESS NOTE    Alexander Reeves  [id number] DOB: 04-13-1965 DOA: 11/18/2018 PCP: [name], NP   Brief Narrative:  Alexander SHOW Sr. is a 53 year old male with a history of asthma, who is speech and hearing impaired and communicates with a sign language came to hospital with 1 day history of right lower quadrant abdominal pain. CT of the abdomen pelvis showed mid and mid to distal ascending colon wall thickening concerning for neoplasm of the right colon. General surgery and gastroenterology were consulted. Patient underwent right colectomy for right colon mass on 11/19/2018.   11/11: Patient is now POD #5. Seen by GS with recommendations to continue LIS with NGT and monitor with IVF.  Assessment & Plan:   Active Problems:   Abdominal pain   Abnormal CT scan, colon   Malignant neoplasm of ascending colon [facility])   Colon cancer (HCC)   Right colon mass with new diagnosis of invasive adenocarcinoma -Status post right colectomy 11/6 by [name] -Surgical pathology from colonoscopy revealed invasive adenocarcinoma -Surgical pathology with noted invasive adenocarcinoma. CEA 2.7. Oncology to follow up outpatient.  Postop ileus-persistent -NG tube in place -NPO -IVF  -Appreciate GS  Asthma -Without acute exacerbation   DVT prophylaxis: Lovenox Code Status: Full code Family Communication: At bedside Disposition Plan: Pending clinical improvement.    Consultants:   GI  General surgery  Antimicrobials:  Anti-infectives (From admission, onward)   Start     Dose/Rate Route Frequency Ordered Stop   11/19/18 1223  clindamycin (CLEOCIN) IVPB 900 mg     900 mg 100 mL/hr over 30 Minutes Intravenous 60 min pre-op 11/19/18 1223 11/19/18 1430   11/19/18 1223  gentamicin (GARAMYCIN) 390 mg in dextrose 5 % 100 mL IVPB     5 mg/kg  78.8 kg 109.8 mL/hr over 60 Minutes Intravenous 60 min pre-op 11/19/18 1223 11/19/18 1500       Subjective:  Patient seen and evaluated today with no new acute complaints or concerns. No acute concerns or events noted overnight. He has cramping abdominal pain that is ongoing, but states he is passing flatus and has had bm. NGT with 900cc over 24 hours.  Objective: Vitals:   11/23/18 0533 11/23/18 1624 11/24/18 0032 11/24/18 0554  BP: 137/81 127/80 133/78 138/82  Pulse: 92 94 (!) 101 100  Resp: 18 18 18 18   Temp: 97.7 F (36.5 C) 98.3 F (36.8 C) 98.2 F (36.8 C) 98.2 F (36.8 C)  TempSrc: Oral Oral Oral Oral  SpO2: 99% 95% 98% 97%  Weight:   77.6 kg   Height:        Intake/Output Summary (Last 24 hours) at 11/24/2018 1057 Last data filed at 11/24/2018 0937 Gross per 24 hour  Intake 2223.33 ml  Output 1565 ml  Net 658.33 ml   Filed Weights   11/21/18 0500 11/22/18 0500 11/24/18 0032  Weight: 78.5 kg 83.4 kg 77.6 kg    Examination:  General exam: Appears calm and comfortable  Respiratory system: Clear to auscultation. Respiratory effort normal. Cardiovascular system: S1 & S2 heard, RRR. No JVD, murmurs, rubs, gallops or clicks. No pedal edema. Gastrointestinal system: Abdomen is nondistended, soft and nontender. No organomegaly or masses felt. Hypoactive bowel sounds heard. NGT with dark, bilious output. Central nervous system: Alert and oriented. No focal neurological deficits. Extremities: Symmetric 5 x 5 power. Skin: No rashes, lesions or ulcers Psychiatry: Judgement and insight appear normal. Mood & affect appropriate.     Data Reviewed:  I have personally reviewed following labs and imaging studies  CBC: Recent Labs  Lab 11/20/18 0304 11/21/18 0526 11/22/18 0608 11/23/18 0533 11/24/18 0506  WBC 7.5 6.5 6.3 5.8 7.4  HGB 12.8* 11.1* 11.8* 10.6* 10.3*  HCT 41.2 35.2* 37.6* 33.8* 33.2*  MCV 84.4 84.4 84.3 83.9 84.7  PLT 189 191 211 196 [id number]   Basic Metabolic Panel: Recent Labs  Lab 11/20/18 0304 11/21/18 0526 11/22/18 0608 11/23/18 0533 11/24/18 0506  NA 135  137 138 140 140  K 4.5 4.0 3.7 3.6 3.8  CL 101 104 104 107 106  CO2 22 22 22 22  21*  GLUCOSE 102* 82 110* 75 74  BUN 6 7 7 8 7   CREATININE 1.15 1.10 1.11 1.16 1.09  CALCIUM 8.2* 8.2* 8.5* 8.0* 8.2*  MG  --  2.1 2.2 2.0 2.0   GFR: Estimated Creatinine Clearance: 82.8 mL/min (by C-G formula based on SCr of 1.09 mg/dL). Liver Function Tests: Recent Labs  Lab 11/18/18 0430  AST 20  ALT 20  ALKPHOS 42  BILITOT 0.3  PROT 6.8  ALBUMIN 4.1   Recent Labs  Lab 11/18/18 0430  LIPASE 34   No results for input(s): AMMONIA in the last 168 hours. Coagulation Profile: No results for input(s): INR, PROTIME in the last 168 hours. Cardiac Enzymes: No results for input(s): CKTOTAL, CKMB, CKMBINDEX, TROPONINI in the last 168 hours. BNP (last 3 results) No results for input(s): PROBNP in the last 8760 hours. HbA1C: No results for input(s): HGBA1C in the last 72 hours. CBG: No results for input(s): GLUCAP in the last 168 hours. Lipid Profile: No results for input(s): CHOL, HDL, LDLCALC, TRIG, CHOLHDL, LDLDIRECT in the last 72 hours. Thyroid Function Tests: No results for input(s): TSH, T4TOTAL, FREET4, T3FREE, THYROIDAB in the last 72 hours. Anemia Panel: No results for input(s): VITAMINB12, FOLATE, FERRITIN, TIBC, IRON, RETICCTPCT in the last 72 hours. Sepsis Labs: No results for input(s): PROCALCITON, LATICACIDVEN in the last 168 hours.  Recent Results (from the past 240 hour(s))  SARS CORONAVIRUS 2 (TAT 6-24 HRS) Nasopharyngeal Nasopharyngeal Swab     Status: None   Collection Time: 11/18/18  2:10 PM   Specimen: Nasopharyngeal Swab  Result Value Ref Range Status   SARS Coronavirus 2 NEGATIVE NEGATIVE Final    Comment: (NOTE) SARS-CoV-2 target nucleic acids are NOT DETECTED. The SARS-CoV-2 RNA is generally detectable in upper and lower respiratory specimens during the acute phase of infection. Negative results do not preclude SARS-CoV-2 infection, do not rule out co-infections  with other pathogens, and should not be used as the sole basis for treatment or other patient management decisions. Negative results must be combined with clinical observations, patient history, and epidemiological information. The expected result is Negative. Fact Sheet for Patients: SugarRoll.be Fact Sheet for Healthcare Providers: https://www.woods-mathews.com/ This test is not yet approved or cleared by the [place] FDA and  has been authorized for detection and/or diagnosis of SARS-CoV-2 by FDA under an Emergency Use Authorization (EUA). This EUA will remain  in effect (meaning this test can be used) for the duration of the COVID-19 declaration under Section 56 4(b)(1) of the Act, 21 U.S.C. section 360bbb-3(b)(1), unless the authorization is terminated or revoked sooner. Performed at [facility], [place] [address], [place], [place] [address]          Radiology Studies: No results found.      Scheduled Meds: . Chlorhexidine Gluconate Cloth  6 each Topical Daily  . enoxaparin (LOVENOX)  injection  40 mg Subcutaneous Q24H  . fluticasone furoate-vilanterol  1 puff Inhalation Daily  . loratadine  10 mg Oral Daily  . montelukast  10 mg Oral QHS  . pantoprazole (PROTONIX) IV  40 mg Intravenous Q24H  . sodium chloride flush  3 mL Intravenous Once   Continuous Infusions: . 0.9 % NaCl with KCl 20 mEq / L 100 mL/hr at 11/24/18 0936  . acetaminophen    . methocarbamol (ROBAXIN) IV 500 mg (11/24/18 0540)     LOS: 5 days    Time spent: 30 minutes    Alexander Lindo Darleen Crocker, DO [facility] Pager [phone]  If 7PM-7AM, please contact night-coverage [email or web address] Password [facility] 11/24/2018, 10:57 AM

## 2018-11-24 NOTE — Progress Notes (Signed)
[facility] Progress Note  5 Days Post-Op  Subjective: CC-  Continues to feel a little better each day. Still bloated but improving. He passed some gas yesterday and had a few loose BMs. No flatus yet this morning. NG with 900cc out last 24 hours. Ambulating frequently.  Spoke with oncology yesterday. Plans to follow up outpatient.  Objective: Vital signs in last 24 hours: Temp:  [98.2 F (36.8 C)-98.3 F (36.8 C)] 98.2 F (36.8 C) (11/11 0554) Pulse Rate:  [94-101] 100 (11/11 0554) Resp:  [18] 18 (11/11 0554) BP: (127-138)/(78-82) 138/82 (11/11 0554) SpO2:  [95 %-98 %] 97 % (11/11 0554) Weight:  [77.6 kg] 77.6 kg (11/11 0032) Last BM Date: 11/24/18  Intake/Output from previous day: 11/10 0701 - 11/11 0700 In: 2458.3 [P.O.:360; I.V.:1915; IV Piggyback:183.3] Out: 1595 [Urine:500; Emesis/NG output:900; Drains:195] Intake/Output this shift: No intake/output data recorded.  PE: Gen: Alert, NAD, pleasant HEENT: EOM's intact, pupils equal and round Card: RRR Pulm: CTAB, no W/R/R, rate andeffort normal Abd: Soft,distended, appropriately tender,few bowel sounds heard,midline incision cdi with staples intact and no erythema or drainage, JP drain with serosanguinous output Skin: no rashes noted, warm and dry    Lab Results:  Recent Labs    11/23/18 0533 11/24/18 0506  WBC 5.8 7.4  HGB 10.6* 10.3*  HCT 33.8* 33.2*  PLT 196 230   BMET Recent Labs    11/23/18 0533 11/24/18 0506  NA 140 140  K 3.6 3.8  CL 107 106  CO2 22 21*  GLUCOSE 75 74  BUN 8 7  CREATININE 1.16 1.09  CALCIUM 8.0* 8.2*   PT/INR No results for input(s): LABPROT, INR in the last 72 hours. CMP     Component Value Date/Time   NA 140 11/24/2018 0506   NA 141 10/11/2018 1115   K 3.8 11/24/2018 0506   CL 106 11/24/2018 0506   CO2 21 (L) 11/24/2018 0506   GLUCOSE 74 11/24/2018 0506   BUN 7 11/24/2018 0506   BUN 9 10/11/2018 1115   CREATININE 1.09 11/24/2018 0506   CALCIUM  8.2 (L) 11/24/2018 0506   PROT 6.8 11/18/2018 0430   PROT 7.1 10/11/2018 1115   ALBUMIN 4.1 11/18/2018 0430   ALBUMIN 4.4 10/11/2018 1115   AST 20 11/18/2018 0430   ALT 20 11/18/2018 0430   ALKPHOS 42 11/18/2018 0430   BILITOT 0.3 11/18/2018 0430   BILITOT 0.2 10/11/2018 1115   GFRNONAA >60 11/24/2018 0506   GFRAA >60 11/24/2018 0506   Lipase     Component Value Date/Time   LIPASE 34 11/18/2018 0430       Studies/Results: No results found.  Anti-infectives: Anti-infectives (From admission, onward)   Start     Dose/Rate Route Frequency Ordered Stop   11/19/18 1223  clindamycin (CLEOCIN) IVPB 900 mg     900 mg 100 mL/hr over 30 Minutes Intravenous 60 min pre-op 11/19/18 1223 11/19/18 1430   11/19/18 1223  gentamicin (GARAMYCIN) 390 mg in dextrose 5 % 100 mL IVPB     5 mg/kg  78.8 kg 109.8 mL/hr over 60 Minutes Intravenous 60 min pre-op 11/19/18 1223 11/19/18 1500       Assessment/Plan HTN  HLD Asthma-TRH tapering prednisone Thyroid disease Deaf  Right colon mass, colonic obstruction S/pRight colectomy11/6 [name] - POD#5 - surgical path pending. Oncology plans to see outpatient - JP drain SS, monitor; continue drain at least until eating - Await return in bowel function. Continue NPO/NGT to LIWS. May attempt clamping  trial soon. Mobilize. Continue IV tylenol. Labs in AM.  ID -abx periop FEN -IVF,NPO except ice chips/NGT to LIWS VTE -SCDs, lovenox Foley -d/c 11/7 Follow up -[name]    LOS: 5 days    Wellington Hampshire, [facility] 11/24/2018, 8:39 AM Please see Amion for pager number during day hours 7:00am-4:30pm

## 2018-11-25 LAB — BASIC METABOLIC PANEL
Anion gap: 13 (ref 5–15)
BUN: 6 mg/dL (ref 6–20)
CO2: 20 mmol/L — ABNORMAL LOW (ref 22–32)
Calcium: 8.3 mg/dL — ABNORMAL LOW (ref 8.9–10.3)
Chloride: 106 mmol/L (ref 98–111)
Creatinine, Ser: 1.14 mg/dL (ref 0.61–1.24)
GFR calc Af Amer: >60 mL/min (ref >60)
GFR calc non Af Amer: >60 mL/min (ref >60)
Glucose, Bld: 83 mg/dL (ref 70–99)
Potassium: 4.1 mmol/L (ref 3.5–5.1)
Sodium: 139 mmol/L (ref 135–145)

## 2018-11-25 LAB — CBC
HCT: 32.2 % — ABNORMAL LOW (ref 39.0–52.0)
Hemoglobin: 9.8 g/dL — ABNORMAL LOW (ref 13.0–17.0)
MCH: 25.9 pg — ABNORMAL LOW (ref 26.0–34.0)
MCHC: 30.4 g/dL (ref 30.0–36.0)
MCV: 85.2 fL (ref 80.0–100.0)
Platelets: 250 10*3/uL (ref 150–400)
RBC: 3.78 MIL/uL — ABNORMAL LOW (ref 4.22–5.81)
RDW: 14.9 % (ref 11.5–15.5)
WBC: 8.9 10*3/uL (ref 4.0–10.5)
nRBC: 0 % (ref 0.0–0.2)

## 2018-11-25 LAB — MAGNESIUM: Magnesium: 2 mg/dL (ref 1.7–2.4)

## 2018-11-25 MED ORDER — ACETAMINOPHEN 325 MG PO TABS
650.0000 mg | ORAL_TABLET | Freq: Four times a day (QID) | ORAL | Status: DC
  Administered 2018-11-25 – 2018-12-02 (×25): 650 mg via ORAL
  Filled 2018-11-25 (×25): qty 2

## 2018-11-25 NOTE — Progress Notes (Signed)
Call light alarmed and nurse went to the bedside to assess need. Patient sitting on the side of the bed with NGT pulled almost out. Patient coughing and gagging. He is gesturing to pull the tube out. I gestured to advance forward and he shook his head no and pointed his finger and shook it back and forth. His wife was moaning and also gesturing to pull it out. He would not allow to advance back in. NGT was only in about 6 inches. Upon assisting with removal, patient was blowing his nose with bright red blood noted and eyes watering and holding his chest which objectively looked discomforting. Sign language interpreter notified to aid in communication. Patient voiced that his throat was dry and irritated and he began to mess with the tube and pulled it some out but then he began to gag and feel as though he could not breath. He voiced that he did not want it reinserted until the doctor seen him in the morning to discuss it. He felt relieved. He wanted to rest and have some ice chips, he voiced that he has been taking in minimal ice chips for comfort of dry throat and mouth. He had 524mL Kaydi Kley liquid in canister from LIS. He denied nausea and/or pain.

## 2018-11-25 NOTE — Progress Notes (Signed)
[facility] Progress Note  6 Days Post-Op  Subjective: CC-  Patient states that he feels much better this morning. He had another BM and is passing more flatus. Feels less bloated. NG tube started coming out early this morning so he pulled it out.  Objective: Vital signs in last 24 hours: Temp:  [98.1 F (36.7 C)-99.5 F (37.5 C)] 98.7 F (37.1 C) (11/12 0512) Pulse Rate:  [90-99] 95 (11/12 0512) Resp:  [16-18] 18 (11/12 0512) BP: (125-145)/(77-87) 128/80 (11/12 0512) SpO2:  [97 %-100 %] 99 % (11/12 0838) Weight:  [77.3 kg-81.4 kg] 81.4 kg (11/12 0500) Last BM Date: 11/24/18  Intake/Output from previous day: 11/11 0701 - 11/12 0700 In: 2279.3 [I.V.:1700; IV Piggyback:549.3] Out: 1805 [Urine:675; Emesis/NG output:1050; Drains:80] Intake/Output this shift: No intake/output data recorded.  PE: Gen: Alert, NAD, pleasant HEENT: EOM's intact, pupils equal and round Card: RRR Pulm: CTAB, no W/R/R, rate andeffort normal Abd: Soft, mild distension, tender over incision, few bowel sounds heard,midline incision cdi with staples intact and no erythema or drainage, JP drain with serosanguinous output Skin: no rashes noted, warm and dry    Lab Results:  Recent Labs    11/24/18 0506 11/25/18 0633  WBC 7.4 8.9  HGB 10.3* 9.8*  HCT 33.2* 32.2*  PLT 230 250   BMET Recent Labs    11/24/18 0506 11/25/18 0633  NA 140 139  K 3.8 4.1  CL 106 106  CO2 21* 20*  GLUCOSE 74 83  BUN 7 6  CREATININE 1.09 1.14  CALCIUM 8.2* 8.3*   PT/INR No results for input(s): LABPROT, INR in the last 72 hours. CMP     Component Value Date/Time   NA 139 11/25/2018 0633   NA 141 10/11/2018 1115   K 4.1 11/25/2018 0633   CL 106 11/25/2018 0633   CO2 20 (L) 11/25/2018 0633   GLUCOSE 83 11/25/2018 0633   BUN 6 11/25/2018 0633   BUN 9 10/11/2018 1115   CREATININE 1.14 11/25/2018 0633   CALCIUM 8.3 (L) 11/25/2018 0633   PROT 6.8 11/18/2018 0430   PROT 7.1 10/11/2018 1115    ALBUMIN 4.1 11/18/2018 0430   ALBUMIN 4.4 10/11/2018 1115   AST 20 11/18/2018 0430   ALT 20 11/18/2018 0430   ALKPHOS 42 11/18/2018 0430   BILITOT 0.3 11/18/2018 0430   BILITOT 0.2 10/11/2018 1115   GFRNONAA >60 11/25/2018 0633   GFRAA >60 11/25/2018 0633   Lipase     Component Value Date/Time   LIPASE 34 11/18/2018 0430       Studies/Results: No results found.  Anti-infectives: Anti-infectives (From admission, onward)   Start     Dose/Rate Route Frequency Ordered Stop   11/19/18 1223  clindamycin (CLEOCIN) IVPB 900 mg     900 mg 100 mL/hr over 30 Minutes Intravenous 60 min pre-op 11/19/18 1223 11/19/18 1430   11/19/18 1223  gentamicin (GARAMYCIN) 390 mg in dextrose 5 % 100 mL IVPB     5 mg/kg  78.8 kg 109.8 mL/hr over 60 Minutes Intravenous 60 min pre-op 11/19/18 1223 11/19/18 1500       Assessment/Plan HTN  HLD Asthma-TRH tapering prednisone Thyroid disease Deaf  Right colon mass, colonic obstruction S/pRight colectomy11/6 [name] - POD#6 - surgical path: Invasive adenocarcinoma with 2/14 positive lymph nodes >>Oncology plans to see outpatient - JP drain SS, monitor; continue drain at least until eating  ID -abx periop FEN -IVF, CLD VTE -SCDs, lovenox Foley -d/c 11/7 Follow up -Dr.  Wakefield   Plan: NG tube came out. Patient passing more flatus, BM this morning, less distension. Will try clear liquids.   LOS: 6 days    [facility] 11/25/2018, 9:12 AM Please see Amion for pager number during day hours 7:00am-4:30pm

## 2018-11-25 NOTE — Progress Notes (Signed)
PROGRESS NOTE    Alexander Reeves  [id number] DOB: [birth year] DOA: 11/18/2018 PCP: [name], NP   Brief Narrative:  Alexander Reevesis a 53 year old male with a history of asthma, who is speech and hearing impaired and communicates with a sign language came to hospital with 1 day history of right lower quadrant abdominal pain. CT of the abdomen pelvis showed mid and mid to distal ascending colon wall thickening concerning for neoplasm of the right colon. General surgery and gastroenterology were consulted. Patient underwent right colectomy for right colon mass on 11/19/2018.   11/11: Patient is now POD #5. Seen by GS with recommendations to continue LIS with NGT and monitor with IVF.  11/12: Patient is postop day #6.  NG tube started coming out this morning so he pulled it out.  He is overall feeling much better and is passing more flatus and is having BM.  Clear liquid diet started by general surgery today.  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 -Surgical pathology from colonoscopy revealed invasive adenocarcinoma -Surgical pathology with noted invasive adenocarcinoma. CEA 2.7. Oncology to follow up outpatient.  Postop ileus-persistent -NG tube out early this morning by patient -Continue IV fluid -Clear liquid diet started by general surgery and will advance as tolerated -A.m. labs  Asthma -Without acute exacerbation   DVT prophylaxis:Lovenox Code Status:Full code Family Communication:At bedside Disposition Plan:Pending clinical improvement.   Hopeful discharge after diet advancement in 1-2 days.   Consultants:  GI  General surgery  Antimicrobials:  Anti-infectives (From admission, onward)   Start      Dose/Rate Route Frequency Ordered Stop   11/19/18 1223  clindamycin (CLEOCIN) IVPB 900 mg     900 mg 100 mL/hr over 30 Minutes Intravenous 60 min pre-op 11/19/18 1223 11/19/18 1430   11/19/18 1223  gentamicin (GARAMYCIN) 390 mg in dextrose 5 % 100 mL IVPB     5 mg/kg  78.8 kg 109.8 mL/hr over 60 Minutes Intravenous 60 min pre-op 11/19/18 1223 11/19/18 1500       Subjective: Patient seen and evaluated today with no new acute complaints or concerns. No acute concerns or events noted overnight.  He states that he is feeling better after NG tube has come out.  He feels like having a bowel movement this morning.  He is passing flatus.  Has only mild, crampy abdominal pain with no nausea or vomiting noted.  Objective: Vitals:   11/25/18 0008 11/25/18 0500 11/25/18 0512 11/25/18 0838  BP: 134/82  128/80   Pulse: 90  95   Resp: 18  18   Temp: 98.1 F (36.7 C)  98.7 F (37.1 C)   TempSrc: Oral  Oral   SpO2: 97%  98% 99%  Weight: 77.3 kg 81.4 kg    Height:        Intake/Output Summary (Last 24 hours) at 11/25/2018 0951 Last data filed at 11/25/2018 0553 Gross per 24 hour  Intake 2279.33 ml  Output 1805 ml  Net 474.33 ml   Filed Weights   11/24/18 0032 11/25/18 0008 11/25/18 0500  Weight: 77.6 kg 77.3 kg 81.4 kg    Examination:  General exam: Appears calm and comfortable  Respiratory system: Clear to auscultation. Respiratory effort normal. Cardiovascular  system: S1 & S2 heard, RRR. No JVD, murmurs, rubs, gallops or clicks. No pedal edema. Gastrointestinal system: Abdomen is nondistended, soft and nontender. No organomegaly or masses felt. Normal bowel sounds heard. Central nervous system: Alert and oriented. No focal neurological deficits. Extremities: Symmetric 5 x 5 power. Skin: No rashes, lesions or ulcers Psychiatry: Judgement and insight appear normal. Mood & affect appropriate.     Data Reviewed: I have personally reviewed following labs and imaging studies  CBC:  Recent Labs  Lab 11/21/18 0526 11/22/18 0608 11/23/18 0533 11/24/18 0506 11/25/18 0633  WBC 6.5 6.3 5.8 7.4 8.9  HGB 11.1* 11.8* 10.6* 10.3* 9.8*  HCT 35.2* 37.6* 33.8* 33.2* 32.2*  MCV 84.4 84.3 83.9 84.7 85.2  PLT 191 211 196 230 [id number]   Basic Metabolic Panel: Recent Labs  Lab 11/21/18 0526 11/22/18 0608 11/23/18 0533 11/24/18 0506 11/25/18 0633  NA 137 138 140 140 139  K 4.0 3.7 3.6 3.8 4.1  CL 104 104 107 106 106  CO2 22 22 22  21* 20*  GLUCOSE 82 110* 75 74 83  BUN 7 7 8 7 6   CREATININE 1.10 1.11 1.16 1.09 1.14  CALCIUM 8.2* 8.5* 8.0* 8.2* 8.3*  MG 2.1 2.2 2.0 2.0 2.0   GFR: Estimated Creatinine Clearance: 79.2 mL/min (by C-G formula based on SCr of 1.14 mg/dL). Liver Function Tests: No results for input(s): AST, ALT, ALKPHOS, BILITOT, PROT, ALBUMIN in the last 168 hours. No results for input(s): LIPASE, AMYLASE in the last 168 hours. No results for input(s): AMMONIA in the last 168 hours. Coagulation Profile: No results for input(s): INR, PROTIME in the last 168 hours. Cardiac Enzymes: No results for input(s): CKTOTAL, CKMB, CKMBINDEX, TROPONINI in the last 168 hours. BNP (last 3 results) No results for input(s): PROBNP in the last 8760 hours. HbA1C: No results for input(s): HGBA1C in the last 72 hours. CBG: No results for input(s): GLUCAP in the last 168 hours. Lipid Profile: No results for input(s): CHOL, HDL, LDLCALC, TRIG, CHOLHDL, LDLDIRECT in the last 72 hours. Thyroid Function Tests: No results for input(s): TSH, T4TOTAL, FREET4, T3FREE, THYROIDAB in the last 72 hours. Anemia Panel: No results for input(s): VITAMINB12, FOLATE, FERRITIN, TIBC, IRON, RETICCTPCT in the last 72 hours. Sepsis Labs: No results for input(s): PROCALCITON, LATICACIDVEN in the last 168 hours.  Recent Results (from the past 240 hour(s))  SARS CORONAVIRUS 2 (TAT 6-24 HRS) Nasopharyngeal Nasopharyngeal Swab     Status: None   Collection Time: 11/18/18  2:10 PM   Specimen:  Nasopharyngeal Swab  Result Value Ref Range Status   SARS Coronavirus 2 NEGATIVE NEGATIVE Final    Comment: (NOTE) SARS-CoV-2 target nucleic acids are NOT DETECTED. The SARS-CoV-2 RNA is generally detectable in upper and lower respiratory specimens during the acute phase of infection. Negative results do not preclude SARS-CoV-2 infection, do not rule out co-infections with other pathogens, and should not be used as the sole basis for treatment or other patient management decisions. Negative results must be combined with clinical observations, patient history, and epidemiological information. The expected result is Negative. Fact Sheet for Patients: SugarRoll.be Fact Sheet for Healthcare Providers: https://www.woods-mathews.com/ This test is not yet approved or cleared by the [place] FDA and  has been authorized for detection and/or diagnosis of SARS-CoV-2 by FDA under an Emergency Use Authorization (EUA). This EUA will remain  in effect (meaning this test can be used) for the duration of the COVID-19 declaration under Section 56 4(b)(1) of the Act, 21  U.S.C. section 360bbb-3(b)(1), unless the authorization is terminated or revoked sooner. Performed at [facility], [place] [address], [place], [place] [address]          Radiology Studies: No results found.      Scheduled Meds: . acetaminophen  650 mg Oral Q6H  . chlorhexidine  15 mL Mouth Rinse BID  . Chlorhexidine Gluconate Cloth  6 each Topical Daily  . enoxaparin (LOVENOX) injection  40 mg Subcutaneous Q24H  . fluticasone furoate-vilanterol  1 puff Inhalation Daily  . loratadine  10 mg Oral Daily  . mouth rinse  15 mL Mouth Rinse q12n4p  . montelukast  10 mg Oral QHS  . pantoprazole (PROTONIX) IV  40 mg Intravenous Q24H  . sodium chloride flush  3 mL Intravenous Once   Continuous Infusions: . 0.9 % NaCl with KCl 20 mEq / L 100 mL/hr at 11/25/18 0655  .  methocarbamol (ROBAXIN) IV Stopped (11/25/18 0553)     LOS: 6 days    Time spent: 30 minutes     Darleen Crocker, DO [facility] Pager [phone]  If 7PM-7AM, please contact night-coverage [email or web address] Password [facility] 11/25/2018, 9:51 AM

## 2018-11-26 LAB — CBC
HCT: 33.4 % — ABNORMAL LOW (ref 39.0–52.0)
Hemoglobin: 10.3 g/dL — ABNORMAL LOW (ref 13.0–17.0)
MCH: 26.4 pg (ref 26.0–34.0)
MCHC: 30.8 g/dL (ref 30.0–36.0)
MCV: 85.6 fL (ref 80.0–100.0)
Platelets: 241 10*3/uL (ref 150–400)
RBC: 3.9 MIL/uL — ABNORMAL LOW (ref 4.22–5.81)
RDW: 14.9 % (ref 11.5–15.5)
WBC: 7.8 10*3/uL (ref 4.0–10.5)
nRBC: 0 % (ref 0.0–0.2)

## 2018-11-26 LAB — BASIC METABOLIC PANEL
Anion gap: 15 (ref 5–15)
BUN: <5 mg/dL — ABNORMAL LOW (ref 6–20)
CO2: 17 mmol/L — ABNORMAL LOW (ref 22–32)
Calcium: 8.4 mg/dL — ABNORMAL LOW (ref 8.9–10.3)
Chloride: 105 mmol/L (ref 98–111)
Creatinine, Ser: 0.8 mg/dL (ref 0.61–1.24)
GFR calc Af Amer: >60 mL/min (ref >60)
GFR calc non Af Amer: >60 mL/min (ref >60)
Glucose, Bld: 86 mg/dL (ref 70–99)
Potassium: 4.1 mmol/L (ref 3.5–5.1)
Sodium: 137 mmol/L (ref 135–145)

## 2018-11-26 LAB — MAGNESIUM: Magnesium: 1.9 mg/dL (ref 1.7–2.4)

## 2018-11-26 MED ORDER — PROMETHAZINE HCL 25 MG/ML IJ SOLN
12.5000 mg | Freq: Once | INTRAMUSCULAR | Status: AC
  Administered 2018-11-26: 12.5 mg via INTRAVENOUS
  Filled 2018-11-26: qty 1

## 2018-11-26 MED ORDER — HYDROMORPHONE HCL 1 MG/ML IJ SOLN
0.5000 mg | INTRAMUSCULAR | Status: DC | PRN
  Administered 2018-11-26 – 2018-11-28 (×4): 0.5 mg via INTRAVENOUS
  Filled 2018-11-26 (×4): qty 1

## 2018-11-26 MED ORDER — METHOCARBAMOL 500 MG PO TABS
500.0000 mg | ORAL_TABLET | Freq: Three times a day (TID) | ORAL | Status: DC
  Administered 2018-11-26 – 2018-12-02 (×18): 500 mg via ORAL
  Filled 2018-11-26 (×18): qty 1

## 2018-11-26 MED ORDER — METOCLOPRAMIDE HCL 5 MG/ML IJ SOLN
10.0000 mg | Freq: Once | INTRAMUSCULAR | Status: AC
  Administered 2018-11-26: 10 mg via INTRAVENOUS
  Filled 2018-11-26: qty 2

## 2018-11-26 NOTE — Progress Notes (Signed)
PROGRESS NOTE    Alexander Reeves  [id number] DOB: 14-Jul-1965 DOA: 11/18/2018 PCP: Alexander Perna, NP   Brief Narrative:  Alexander Reevesis a 53 year old male with a history of asthma, who is speech and hearing impaired and communicates with a sign language came to hospital with 1 day history of right lower quadrant abdominal pain. CT of the abdomen pelvis showed mid and mid to distal ascending colon wall thickening concerning for neoplasm of the right colon. General surgery and gastroenterology were consulted. Patient underwent right colectomy for right colon mass on 11/19/2018.  11/11: Patient is now POD #5. Seen by GS with recommendations to continue LIS with NGT and monitor with IVF.  11/12: Patient is postop day #6.  NG tube started coming out this morning so he pulled it out.  He is overall feeling much better and is passing more flatus and is having BM.  Clear liquid diet started by general surgery today.  11/13: Patient is postop day #7.  He is tolerating clear liquid diet.  General surgery will advance diet to full liquid and then soft.  Anticipate discharge over weekend if stable.  Will need oncology follow-up.  Assessment & Plan:   Active Problems:   Abdominal pain   Abnormal CT scan, colon   Malignant neoplasm of ascending colon (HCC)   Colon cancer (HCC)  Right colon masswith new diagnosis of invasive adenocarcinoma -Status post right colectomy 11/6 by [name] -Surgical pathology from colonoscopy revealed invasive adenocarcinoma -Surgical pathologywith noted invasive adenocarcinoma. CEA 2.7. Oncology to follow up outpatient. -Drain to be removed by general surgery once stable  Postop ileus-persistent -Patient tolerating clear liquids.  General surgery has advanced to full liquid and then soft thereafter.  If he is tolerating, may be candidate for discharge in a.m. -Continue IV fluid for now, but decreased rate  Asthma -Without acute  exacerbation   DVT prophylaxis:Lovenox Code Status:Full code Family Communication:At bedside Disposition Plan:Anticipate discharge in a.m. if tolerating diet.  Will need oncology follow-up for new diagnosis of invasive adenocarcinoma.   Consultants:  GI  General surgery  Antimicrobials:  Anti-infectives (From admission, onward)   Start     Dose/Rate Route Frequency Ordered Stop   11/19/18 1223  clindamycin (CLEOCIN) IVPB 900 mg     900 mg 100 mL/hr over 30 Minutes Intravenous 60 min pre-op 11/19/18 1223 11/19/18 1430   11/19/18 1223  gentamicin (GARAMYCIN) 390 mg in dextrose 5 % 100 mL IVPB     5 mg/kg  78.8 kg 109.8 mL/hr over 60 Minutes Intravenous 60 min pre-op 11/19/18 1223 11/19/18 1500       Subjective: Patient seen and evaluated today with no new acute complaints or concerns. No acute concerns or events noted overnight.  He is having some mild crampy abdominal pain and has an urge to have a bowel movement.  Wanting to advance diet.  Objective: Vitals:   11/25/18 0512 11/25/18 0838 11/26/18 0002 11/26/18 0452  BP: 128/80  125/78 120/89  Pulse: 95  90 91  Resp: 18  18 18   Temp: 98.7 F (37.1 C)  98 F (36.7 C) 98.2 F (36.8 C)  TempSrc: Oral  Oral Oral  SpO2: 98% 99% 99% 99%  Weight:   75.2 kg   Height:        Intake/Output Summary (Last 24 hours) at 11/26/2018 1031 Last data filed at 11/26/2018 0000 Gross per 24 hour  Intake 669.08 ml  Output 60 ml  Net 609.08  ml   Filed Weights   11/25/18 0008 11/25/18 0500 11/26/18 0002  Weight: 77.3 kg 81.4 kg 75.2 kg    Examination:  General exam: Appears calm and comfortable  Respiratory system: Clear to auscultation. Respiratory effort normal. Cardiovascular system: S1 & S2 heard, RRR. No JVD, murmurs, rubs, gallops or clicks. No pedal edema. Gastrointestinal system: Abdomen is nondistended, soft and nontender. No organomegaly or masses felt. Normal bowel sounds heard.  Drain with serosanguineous  discharge.  Midline incision clean dry and intact with staples. Central nervous system: Alert and oriented. No focal neurological deficits. Extremities: Symmetric 5 x 5 power. Skin: No rashes, lesions or ulcers Psychiatry: Judgement and insight appear normal. Mood & affect appropriate.     Data Reviewed: I have personally reviewed following labs and imaging studies  CBC: Recent Labs  Lab 11/22/18 0608 11/23/18 0533 11/24/18 0506 11/25/18 0633 11/26/18 0545  WBC 6.3 5.8 7.4 8.9 7.8  HGB 11.8* 10.6* 10.3* 9.8* 10.3*  HCT 37.6* 33.8* 33.2* 32.2* 33.4*  MCV 84.3 83.9 84.7 85.2 85.6  PLT 211 196 230 250 [id number]   Basic Metabolic Panel: Recent Labs  Lab 11/22/18 0608 11/23/18 0533 11/24/18 0506 11/25/18 0633 11/26/18 0545  NA 138 140 140 139 137  K 3.7 3.6 3.8 4.1 4.1  CL 104 107 106 106 105  CO2 22 22 21* 20* 17*  GLUCOSE 110* 75 74 83 86  BUN 7 8 7 6  <5*  CREATININE 1.11 1.16 1.09 1.14 0.80  CALCIUM 8.5* 8.0* 8.2* 8.3* 8.4*  MG 2.2 2.0 2.0 2.0 1.9   GFR: Estimated Creatinine Clearance: 112.8 mL/min (by C-G formula based on SCr of 0.8 mg/dL). Liver Function Tests: No results for input(s): AST, ALT, ALKPHOS, BILITOT, PROT, ALBUMIN in the last 168 hours. No results for input(s): LIPASE, AMYLASE in the last 168 hours. No results for input(s): AMMONIA in the last 168 hours. Coagulation Profile: No results for input(s): INR, PROTIME in the last 168 hours. Cardiac Enzymes: No results for input(s): CKTOTAL, CKMB, CKMBINDEX, TROPONINI in the last 168 hours. BNP (last 3 results) No results for input(s): PROBNP in the last 8760 hours. HbA1C: No results for input(s): HGBA1C in the last 72 hours. CBG: No results for input(s): GLUCAP in the last 168 hours. Lipid Profile: No results for input(s): CHOL, HDL, LDLCALC, TRIG, CHOLHDL, LDLDIRECT in the last 72 hours. Thyroid Function Tests: No results for input(s): TSH, T4TOTAL, FREET4, T3FREE, THYROIDAB in the last 72 hours. Anemia  Panel: No results for input(s): VITAMINB12, FOLATE, FERRITIN, TIBC, IRON, RETICCTPCT in the last 72 hours. Sepsis Labs: No results for input(s): PROCALCITON, LATICACIDVEN in the last 168 hours.  Recent Results (from the past 240 hour(s))  SARS CORONAVIRUS 2 (TAT 6-24 HRS) Nasopharyngeal Nasopharyngeal Swab     Status: None   Collection Time: 11/18/18  2:10 PM   Specimen: Nasopharyngeal Swab  Result Value Ref Range Status   SARS Coronavirus 2 NEGATIVE NEGATIVE Final    Comment: (NOTE) SARS-CoV-2 target nucleic acids are NOT DETECTED. The SARS-CoV-2 RNA is generally detectable in upper and lower respiratory specimens during the acute phase of infection. Negative results do not preclude SARS-CoV-2 infection, do not rule out co-infections with other pathogens, and should not be used as the sole basis for treatment or other patient management decisions. Negative results must be combined with clinical observations, patient history, and epidemiological information. The expected result is Negative. Fact Sheet for Patients: SugarRoll.be Fact Sheet for Healthcare Providers: https://www.woods-mathews.com/ This test is not  yet approved or cleared by the [place] and  has been authorized for detection and/or diagnosis of SARS-CoV-2 by FDA under an Emergency Use Authorization (EUA). This EUA will remain  in effect (meaning this test can be used) for the duration of the COVID-19 declaration under Section 56 4(b)(1) of the Act, 21 U.S.C. section 360bbb-3(b)(1), unless the authorization is terminated or revoked sooner. Performed at [facility], [place] [address] [place], [place] [address]          Radiology Studies: No results found.      Scheduled Meds: . acetaminophen  650 mg Oral Q6H  . chlorhexidine  15 mL Mouth Rinse BID  . Chlorhexidine Gluconate Cloth  6 each Topical Daily  . enoxaparin (LOVENOX) injection  40 mg  Subcutaneous Q24H  . fluticasone furoate-vilanterol  1 puff Inhalation Daily  . loratadine  10 mg Oral Daily  . mouth rinse  15 mL Mouth Rinse q12n4p  . methocarbamol  500 mg Oral TID  . montelukast  10 mg Oral QHS  . pantoprazole (PROTONIX) IV  40 mg Intravenous Q24H  . sodium chloride flush  3 mL Intravenous Once   Continuous Infusions: . 0.9 % NaCl with KCl 20 mEq / L 50 mL/hr at 11/26/18 0925     LOS: 7 days    Time spent: 30 minutes    Ermin Parisien Darleen Crocker, DO [facility] Pager [phone]  If 7PM-7AM, please contact night-coverage [email or web address] Password [email or web address] 11/26/2018, 10:31 AM

## 2018-11-26 NOTE — Plan of Care (Signed)
  Problem: Pain Managment: Goal: General experience of comfort will improve Outcome: Progressing   Problem: Activity: Goal: Risk for activity intolerance will decrease Outcome: Progressing   

## 2018-11-26 NOTE — Progress Notes (Signed)
[facility] paged informed that patient was having vomiting and PRN Zofran was given 1710 new order of IV phenergan 12.5 was given as ordered and patient attempted to take scheduled and began to vomit so [facility] was paged again. Arthor Captain LPN

## 2018-11-26 NOTE — Progress Notes (Signed)
[facility] Progress Note  7 Days Post-Op  Subjective: Patient has some cramping abdominal pain but it is not severe. Tolerating CLD and having bowel movements. Denies nausea. Wife at bedside, ASL interpreter used.   Objective: Vital signs in last 24 hours: Temp:  [98 F (36.7 C)-98.2 F (36.8 C)] 98.2 F (36.8 C) (11/13 0452) Pulse Rate:  [90-91] 91 (11/13 0452) Resp:  [18] 18 (11/13 0452) BP: (120-125)/(78-89) 120/89 (11/13 0452) SpO2:  [99 %] 99 % (11/13 0452) Weight:  [75.2 kg] 75.2 kg (11/13 0002) Last BM Date: 11/24/18  Intake/Output from previous day: 11/12 0701 - 11/13 0700 In: 669.1 [I.V.:385; IV Piggyback:284.1] Out: 60 [Drains:60] Intake/Output this shift: No intake/output data recorded.  PE: Gen:  Alert, NAD, pleasant Pulm:  Normal effort Abd: Soft, appropriately tender, mildly distended, +BS, midline incision c/d/i, drain in RLQ with SS fluid Skin: warm and dry, no rashes  Psych: A&Ox3   Lab Results:  Recent Labs    11/25/18 0633 11/26/18 0545  WBC 8.9 7.8  HGB 9.8* 10.3*  HCT 32.2* 33.4*  PLT 250 241   BMET Recent Labs    11/25/18 0633 11/26/18 0545  NA 139 137  K 4.1 4.1  CL 106 105  CO2 20* 17*  GLUCOSE 83 86  BUN 6 <5*  CREATININE 1.14 0.80  CALCIUM 8.3* 8.4*   PT/INR No results for input(s): LABPROT, INR in the last 72 hours. CMP     Component Value Date/Time   NA 137 11/26/2018 0545   NA 141 10/11/2018 1115   K 4.1 11/26/2018 0545   CL 105 11/26/2018 0545   CO2 17 (L) 11/26/2018 0545   GLUCOSE 86 11/26/2018 0545   BUN <5 (L) 11/26/2018 0545   BUN 9 10/11/2018 1115   CREATININE 0.80 11/26/2018 0545   CALCIUM 8.4 (L) 11/26/2018 0545   PROT 6.8 11/18/2018 0430   PROT 7.1 10/11/2018 1115   ALBUMIN 4.1 11/18/2018 0430   ALBUMIN 4.4 10/11/2018 1115   AST 20 11/18/2018 0430   ALT 20 11/18/2018 0430   ALKPHOS 42 11/18/2018 0430   BILITOT 0.3 11/18/2018 0430   BILITOT 0.2 10/11/2018 1115   GFRNONAA >60 11/26/2018  0545   GFRAA >60 11/26/2018 0545   Lipase     Component Value Date/Time   LIPASE 34 11/18/2018 0430       Studies/Results: No results found.  Anti-infectives: Anti-infectives (From admission, onward)   Start     Dose/Rate Route Frequency Ordered Stop   11/19/18 1223  clindamycin (CLEOCIN) IVPB 900 mg     900 mg 100 mL/hr over 30 Minutes Intravenous 60 min pre-op 11/19/18 1223 11/19/18 1430   11/19/18 1223  gentamicin (GARAMYCIN) 390 mg in dextrose 5 % 100 mL IVPB     5 mg/kg  78.8 kg 109.8 mL/hr over 60 Minutes Intravenous 60 min pre-op 11/19/18 1223 11/19/18 1500       Assessment/Plan HTN  HLD Asthma-TRH tapering prednisone Thyroid disease Deaf  Right colon mass, colonic obstruction S/pRight colectomy11/6 [name] - POD#7 - surgical path: Invasive adenocarcinoma with 2/14 positive lymph nodes >>Oncology plans to see outpatient - JP drain SS; continue drain today - may be able to d/c tomorrow  ID -abx periop FEN -decrease IVF, advance to FLD VTE -SCDs, lovenox Foley -d/c 11/7 Follow up -[name]   Plan: Advance diet to FLD this AM and soft for dinner if doing well. May be ready for discharge over the weekend. Will ensure patient  has surgical follow up.   LOS: 7 days    Brigid Re , [facility] 11/26/2018, 8:12 AM Please see Amion for pager number during day hours 7:00am-4:30pm

## 2018-11-27 ENCOUNTER — Inpatient Hospital Stay (HOSPITAL_COMMUNITY): Payer: Medicare Other

## 2018-11-27 DIAGNOSIS — K9189 Other postprocedural complications and disorders of digestive system: Secondary | ICD-10-CM

## 2018-11-27 DIAGNOSIS — K567 Ileus, unspecified: Secondary | ICD-10-CM

## 2018-11-27 LAB — CBC
HCT: 33 % — ABNORMAL LOW (ref 39.0–52.0)
Hemoglobin: 10.5 g/dL — ABNORMAL LOW (ref 13.0–17.0)
MCH: 26.2 pg (ref 26.0–34.0)
MCHC: 31.8 g/dL (ref 30.0–36.0)
MCV: 82.3 fL (ref 80.0–100.0)
Platelets: 337 10*3/uL (ref 150–400)
RBC: 4.01 MIL/uL — ABNORMAL LOW (ref 4.22–5.81)
RDW: 14.6 % (ref 11.5–15.5)
WBC: 8 10*3/uL (ref 4.0–10.5)
nRBC: 0 % (ref 0.0–0.2)

## 2018-11-27 LAB — BASIC METABOLIC PANEL
Anion gap: 14 (ref 5–15)
BUN: 6 mg/dL (ref 6–20)
CO2: 19 mmol/L — ABNORMAL LOW (ref 22–32)
Calcium: 8.8 mg/dL — ABNORMAL LOW (ref 8.9–10.3)
Chloride: 105 mmol/L (ref 98–111)
Creatinine, Ser: 1.07 mg/dL (ref 0.61–1.24)
GFR calc Af Amer: >60 mL/min (ref >60)
GFR calc non Af Amer: >60 mL/min (ref >60)
Glucose, Bld: 101 mg/dL — ABNORMAL HIGH (ref 70–99)
Potassium: 4 mmol/L (ref 3.5–5.1)
Sodium: 138 mmol/L (ref 135–145)

## 2018-11-27 IMAGING — DX DG ABDOMEN ACUTE W/ 1V CHEST
3 series · 4 of 4 positions shown · non-contrast
Comparison: CT [DATE], radiographs [DATE], chest x-ray
[DATE], [DATE]

CLINICAL DATA: Abdominal pain and vomiting

EXAM:
DG ABDOMEN ACUTE W/ 1V CHEST

[Series 2: abdomen erect · 0.14mm/px · 2 of 2 slices shown]
[im 1/2]
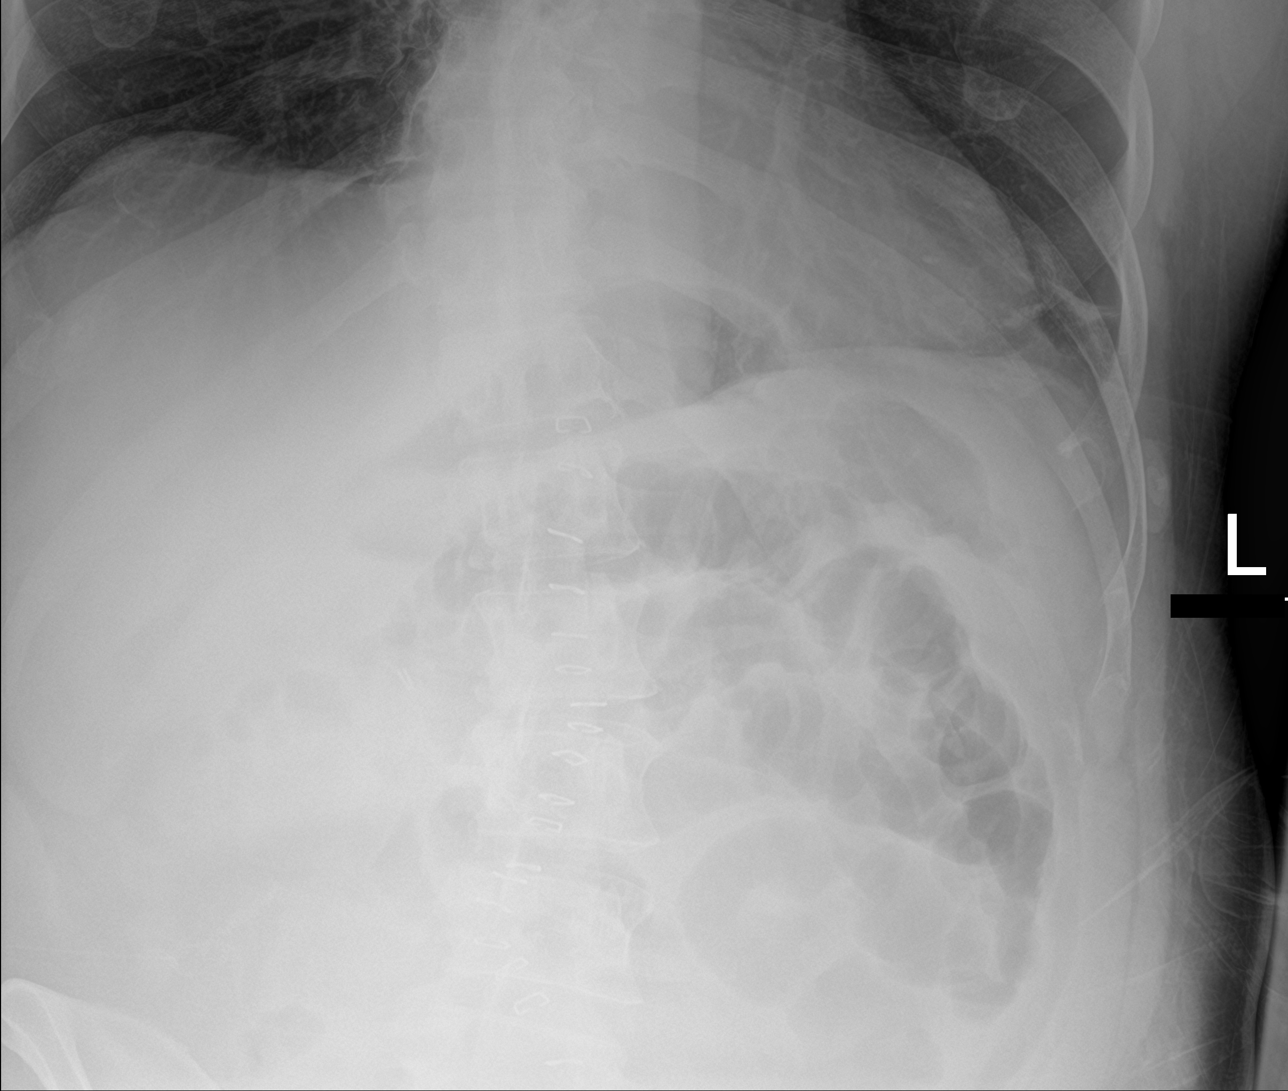
[im 2/2]
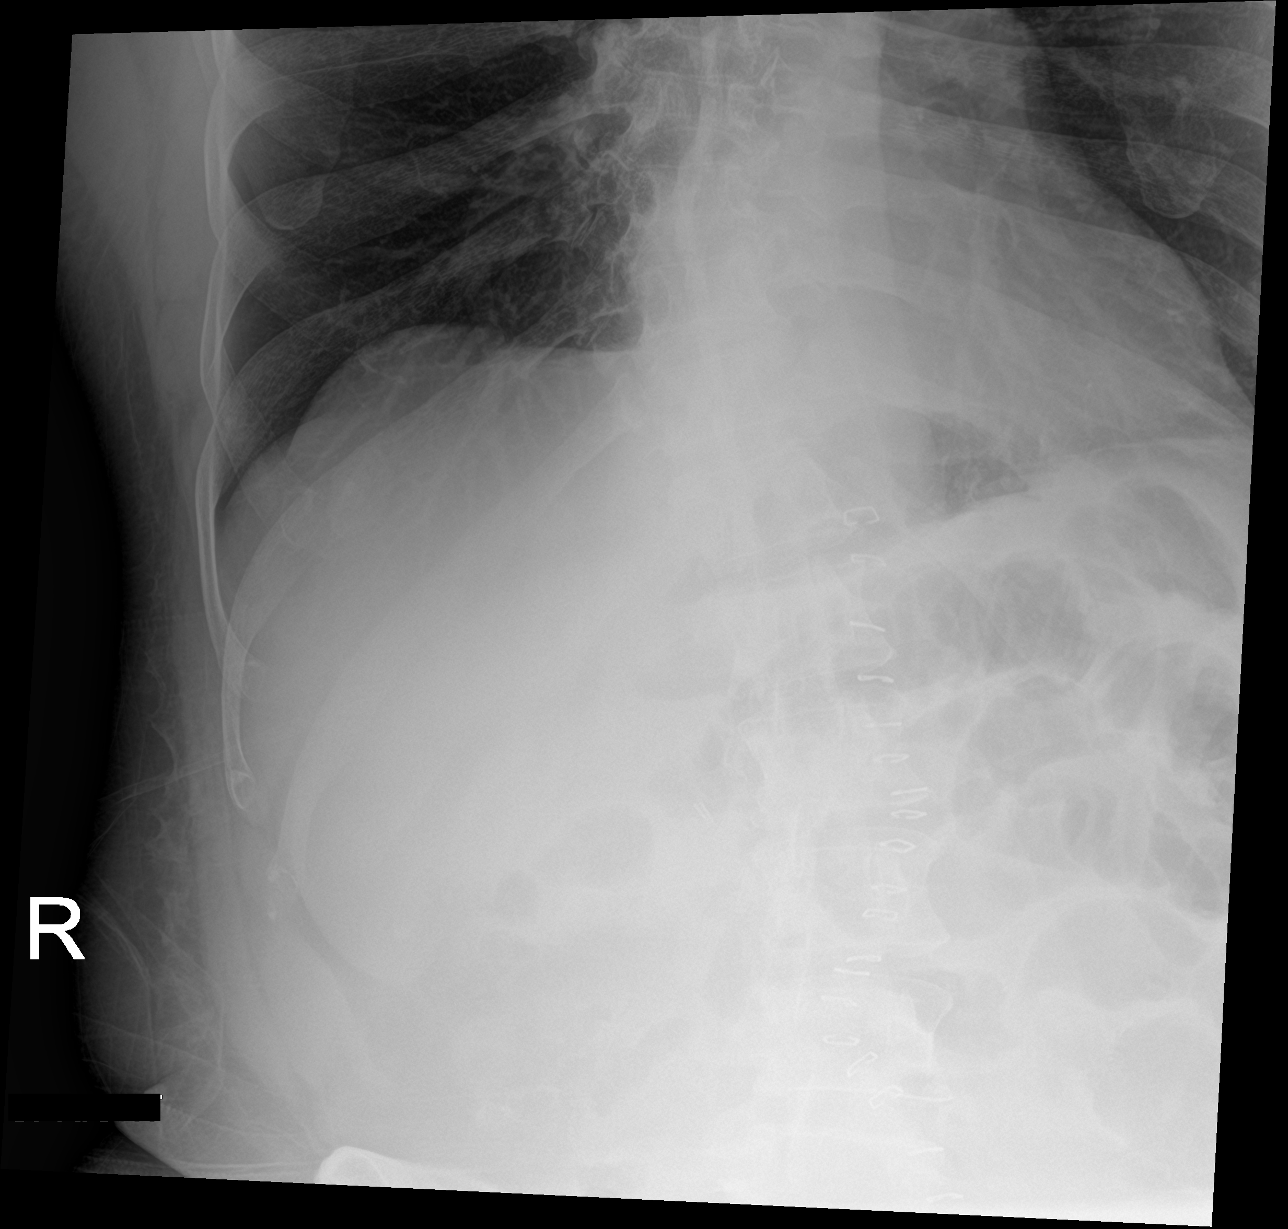

[abdomen supine]
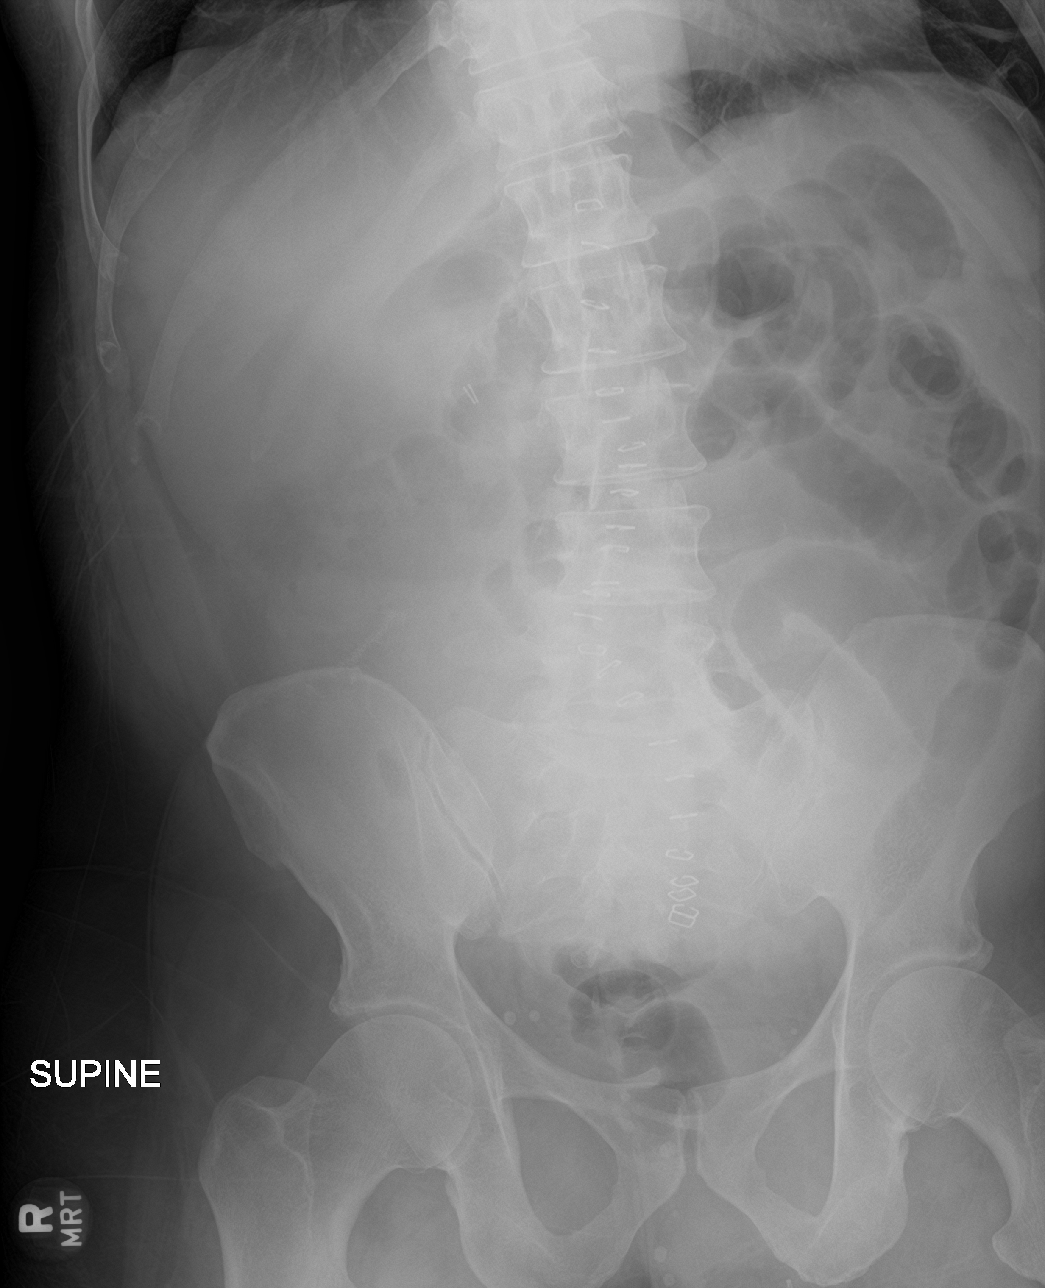

[chest ap]
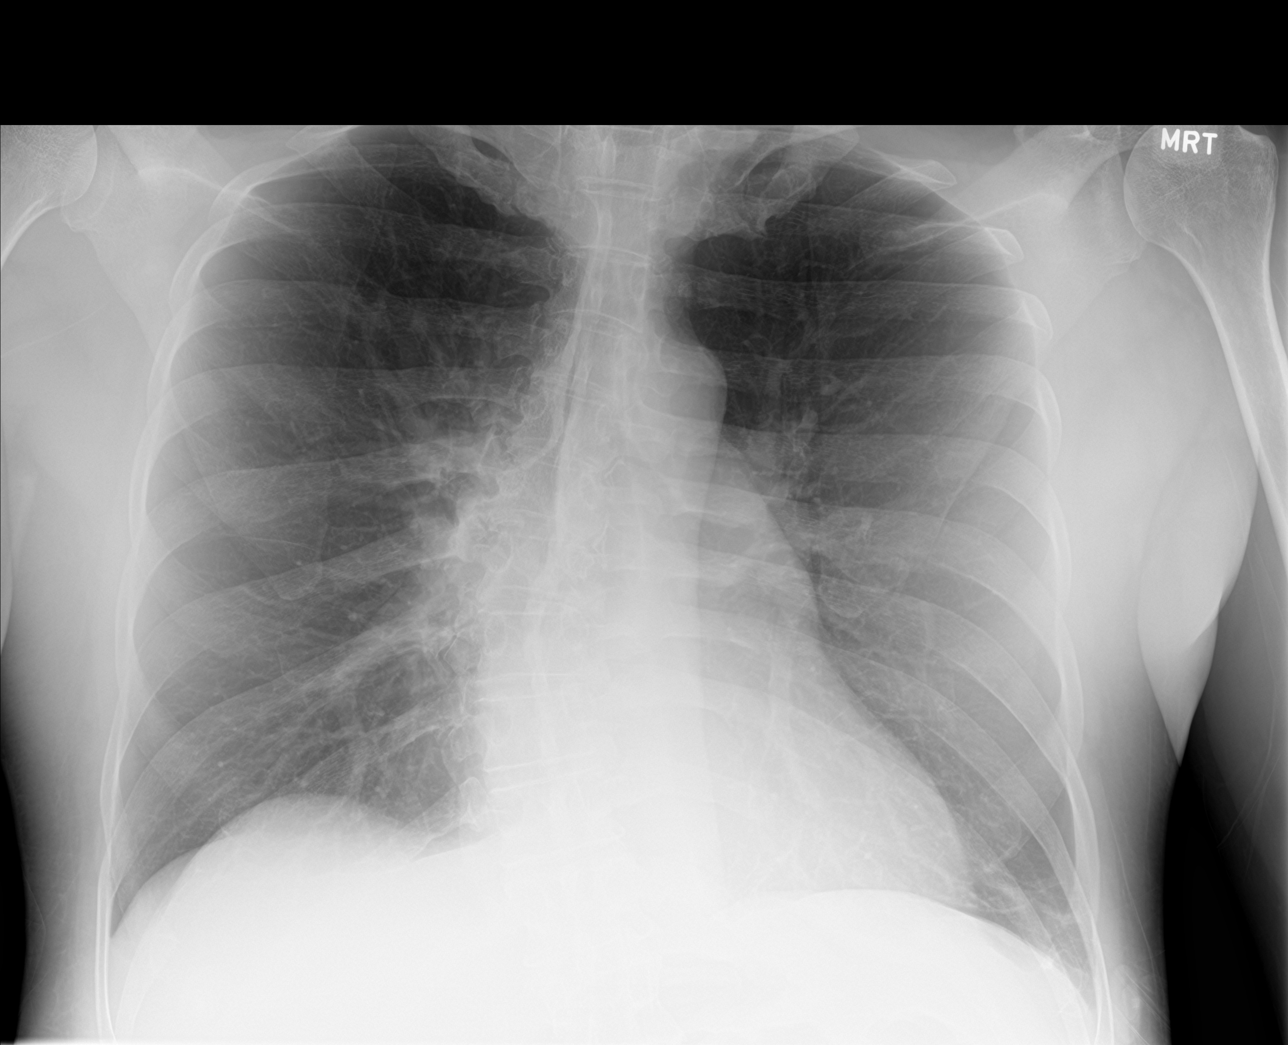

[4 of 4 positions shown; findings below may reference images not displayed]

FINDINGS: Single-view chest demonstrates linear atelectasis at the lingula and
left base. Attenuated lung opacities at the periphery of the left
mid to lower lung but without discrete pleural line to suggest in
pneumothorax. Stable cardiomediastinal silhouette.

Supine and upright views of the abdomen demonstrate cutaneous
staples at the midline. Slight decreased small and large bowel
distension since prior radiograph. Calcified phleboliths in the
pelvis
IMPRESSION: 1. Linear atelectasis or scarring at the lingula and left base.
2. Slight interval decrease in gaseous dilatation of small and large
bowel

## 2018-11-27 MED ORDER — BOOST / RESOURCE BREEZE PO LIQD CUSTOM
1.0000 | Freq: Three times a day (TID) | ORAL | Status: DC
  Administered 2018-11-27 – 2018-12-02 (×11): 1 via ORAL
  Filled 2018-11-27 (×3): qty 1

## 2018-11-27 MED ORDER — METOCLOPRAMIDE HCL 5 MG/ML IJ SOLN
10.0000 mg | Freq: Four times a day (QID) | INTRAMUSCULAR | Status: DC
  Administered 2018-11-27 – 2018-11-29 (×8): 10 mg via INTRAVENOUS
  Filled 2018-11-27 (×8): qty 2

## 2018-11-27 NOTE — Progress Notes (Signed)
PROGRESS NOTE    Alexander Reeves  [id number] DOB: December 12, 1965 DOA: 11/18/2018 PCP: Alexander Perna, NP   Brief Narrative:  Alexander Reevesis a 53 year old male presenting with with 1 day history of right lower quadrant abdominal pain,, and noted to have and noted to have right colonic neoplasm right colonic neoplasm. Patient underwent right colectomy for right colon mass on 11/19/2018.   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 Donne Reeves - colonoscopy 11/19/2018 by Dr. Thornton Reeves -Surgical pathologywith noted invasive adenocarcinoma.  -Oncology  follow up outpatient.   Postop ileus-persistent bowel rest-advance diet as tolerated  Supportive anti nauseants and antiemetics  Monitor renal function with  Fluid and electrolytes status   Asthma - stable   DVT prophylaxis:Lovenox Code Status:Full code Family Communication: Disposition Plan:Anticipate discharge . if tolerating diet.  Will need oncology follow-up for new diagnosis of invasive adenocarcinoma.   Consultants:  GI  General surgery  Antimicrobials:  Anti-infectives (From admission, onward)   Start     Dose/Rate Route Frequency Ordered Stop   11/19/18 1223  clindamycin (CLEOCIN) IVPB 900 mg     900 mg 100 mL/hr over 30 Minutes Intravenous 60 min pre-op 11/19/18 1223 11/19/18 1430   11/19/18 1223  gentamicin (GARAMYCIN) 390 mg in dextrose 5 % 100 mL IVPB     5 mg/kg  78.8 kg 109.8 mL/hr over 60 Minutes Intravenous 60 min pre-op 11/19/18 1223 11/19/18 1500      Subjective: Patient seen and evaluated today .  Episodes of nausea with vomiting overnight  - NPO-adv diet as tol Objective: Vitals:   11/26/18 1244 11/26/18 1756 11/27/18 0019 11/27/18  0516  BP: 114/77 116/81 125/84 132/86  Pulse: (!) 110 (!) 105 (!) 103 (!) 110  Resp: 16 16 17 16   Temp: 98.6 F (37 C) 99.1 F (37.3 C) 98.5 F (36.9 C) 98.6 F (37 C)  TempSrc: Oral Oral Oral Oral  SpO2: 97% 98% 97% 98%  Weight:      Height:        Intake/Output Summary (Last 24 hours) at 11/27/2018 1206 Last data filed at 11/27/2018 0648 Gross per 24 hour  Intake -  Output 310 ml  Net -310 ml   Filed Weights   11/25/18 0008 11/25/18 0500 11/26/18 0002  Weight: 77.3 kg 81.4 kg 75.2 kg    Examination:  General exam: Appears calm and comfortable  Respiratory system: Clear to auscultation. Respiratory effort normal. Cardiovascular system: S1 & S2 heard, RRR. No JVD, murmurs, rubs, gallops or clicks. No pedal edema. Gastrointestinal system: Abdomen is mildly distended, mildly tender.  Drain with serosanguineous discharge.  Midline incision clean dry and intact with staples. Central nervous system: Alert and oriented. No focal neurological deficits. Extremities: Symmetric 5 x 5 power. Skin: No rashes, lesions or ulcers Psychiatry: Judgement and insight appear normal. Mood & affect appropriate.     Data Reviewed: I have personally reviewed following labs and imaging studies  CBC: Recent Labs  Lab 11/23/18 0533 11/24/18 0506 11/25/18 0633 11/26/18 0545 11/27/18 0536  WBC 5.8 7.4 8.9 7.8 8.0  HGB 10.6* 10.3* 9.8* 10.3* 10.5*  HCT 33.8* 33.2* 32.2* 33.4* 33.0*  MCV 83.9 84.7 85.2 85.6 82.3  PLT 196 230 250 241 337  Basic Metabolic Panel: Recent Labs  Lab 11/22/18 0608 11/23/18 0533 11/24/18 0506 11/25/18 0633 11/26/18 0545 11/27/18 0536  NA 138 140 140 139 137 138  K 3.7 3.6 3.8 4.1 4.1 4.0  CL 104 107 106 106 105 105  CO2 22 22 21* 20* 17* 19*  GLUCOSE 110* 75 74 83 86 101*  BUN 7 8 7 6  <5* 6  CREATININE 1.11 1.16 1.09 1.14 0.80 1.07  CALCIUM 8.5* 8.0* 8.2* 8.3* 8.4* 8.8*  MG 2.2 2.0 2.0 2.0 1.9  --    GFR: Estimated Creatinine Clearance: 84.3  mL/min (by C-G formula based on SCr of 1.07 mg/dL). Liver Function Tests: No results for input(s): AST, ALT, ALKPHOS, BILITOT, PROT, ALBUMIN in the last 168 hours. No results for input(s): LIPASE, AMYLASE in the last 168 hours. No results for input(s): AMMONIA in the last 168 hours. Coagulation Profile: No results for input(s): INR, PROTIME in the last 168 hours. Cardiac Enzymes: No results for input(s): CKTOTAL, CKMB, CKMBINDEX, TROPONINI in the last 168 hours. BNP (last 3 results) No results for input(s): PROBNP in the last 8760 hours. HbA1C: No results for input(s): HGBA1C in the last 72 hours. CBG: No results for input(s): GLUCAP in the last 168 hours. Lipid Profile: No results for input(s): CHOL, HDL, LDLCALC, TRIG, CHOLHDL, LDLDIRECT in the last 72 hours. Thyroid Function Tests: No results for input(s): TSH, T4TOTAL, FREET4, T3FREE, THYROIDAB in the last 72 hours. Anemia Panel: No results for input(s): VITAMINB12, FOLATE, FERRITIN, TIBC, IRON, RETICCTPCT in the last 72 hours. Sepsis Labs: No results for input(s): PROCALCITON, LATICACIDVEN in the last 168 hours.  Recent Results (from the past 240 hour(s))  SARS CORONAVIRUS 2 (TAT 6-24 HRS) Nasopharyngeal Nasopharyngeal Swab     Status: None   Collection Time: 11/18/18  2:10 PM   Specimen: Nasopharyngeal Swab  Result Value Ref Range Status   SARS Coronavirus 2 NEGATIVE NEGATIVE Final    Comment: (NOTE) SARS-CoV-2 target nucleic acids are NOT DETECTED. The SARS-CoV-2 RNA is generally detectable in upper and lower respiratory specimens during the acute phase of infection. Negative results do not preclude SARS-CoV-2 infection, do not rule out co-infections with other pathogens, and should not be used as the sole basis for treatment or other patient management decisions. Negative results must be combined with clinical observations, patient history, and epidemiological information. The expected result is Negative. Fact Sheet  for Patients: SugarRoll.be Fact Sheet for Healthcare Providers: https://www.woods-mathews.com/ This test is not yet approved or cleared by the [place] FDA and  has been authorized for detection and/or diagnosis of SARS-CoV-2 by FDA under an Emergency Use Authorization (EUA). This EUA will remain  in effect (meaning this test can be used) for the duration of the COVID-19 declaration under Section 56 4(b)(1) of the Act, 21 U.S.C. section 360bbb-3(b)(1), unless the authorization is terminated or revoked sooner. Performed at [facility], [place] [address], [place], [place] [address]          Radiology Studies: No results found.      Scheduled Meds: . acetaminophen  650 mg Oral Q6H  . chlorhexidine  15 mL Mouth Rinse BID  . Chlorhexidine Gluconate Cloth  6 each Topical Daily  . enoxaparin (LOVENOX) injection  40 mg Subcutaneous Q24H  . feeding supplement  1 Container Oral TID BM  . fluticasone furoate-vilanterol  1 puff Inhalation Daily  . loratadine  10 mg Oral Daily  . mouth rinse  15 mL Mouth Rinse q12n4p  .  methocarbamol  500 mg Oral TID  . metoCLOPramide (REGLAN) injection  10 mg Intravenous Q6H  . montelukast  10 mg Oral QHS  . pantoprazole (PROTONIX) IV  40 mg Intravenous Q24H  . sodium chloride flush  3 mL Intravenous Once   Continuous Infusions: . 0.9 % NaCl with KCl 20 mEq / L 50 mL/hr at 11/26/18 0925     LOS: 8 days    Time spent:25 minutes    Benito Mccreedy,  MD [facility] Pager [phone]  If 7PM-7AM, please contact night-coverage [email or web address] Password [email or web address] 11/27/2018, 12:06 PM

## 2018-11-27 NOTE — Progress Notes (Signed)
[facility] Progress Note  8 Days Post-Op  Subjective: Emesis overnight, feels bloated, having diarrhea. Wife at bedside, ASL interpreter used.   Objective: Vital signs in last 24 hours: Temp:  [98.5 F (36.9 C)-99.1 F (37.3 C)] 98.6 F (37 C) (11/14 0516) Pulse Rate:  [103-110] 110 (11/14 0516) Resp:  [16-17] 16 (11/14 0516) BP: (114-132)/(77-86) 132/86 (11/14 0516) SpO2:  [97 %-98 %] 98 % (11/14 0516) Last BM Date: 11/26/18  Intake/Output from previous day: 11/13 0701 - 11/14 0700 In: -  Out: 400 [Drains:400] Intake/Output this shift: No intake/output data recorded.  PE: Gen:  Alert, NAD, pleasant Pulm:  Normal effort Abd: Soft, appropriately tender, distended, +BS, midline incision c/d/i with induration at inferior aspect, drain in RLQ with serous fluid Skin: warm and dry, no rashes  Psych: A&Ox3   Lab Results:  Recent Labs    11/26/18 0545 11/27/18 0536  WBC 7.8 8.0  HGB 10.3* 10.5*  HCT 33.4* 33.0*  PLT 241 337   BMET Recent Labs    11/26/18 0545 11/27/18 0536  NA 137 138  K 4.1 4.0  CL 105 105  CO2 17* 19*  GLUCOSE 86 101*  BUN <5* 6  CREATININE 0.80 1.07  CALCIUM 8.4* 8.8*   PT/INR No results for input(s): LABPROT, INR in the last 72 hours. CMP     Component Value Date/Time   NA 138 11/27/2018 0536   NA 141 10/11/2018 1115   K 4.0 11/27/2018 0536   CL 105 11/27/2018 0536   CO2 19 (L) 11/27/2018 0536   GLUCOSE 101 (H) 11/27/2018 0536   BUN 6 11/27/2018 0536   BUN 9 10/11/2018 1115   CREATININE 1.07 11/27/2018 0536   CALCIUM 8.8 (L) 11/27/2018 0536   PROT 6.8 11/18/2018 0430   PROT 7.1 10/11/2018 1115   ALBUMIN 4.1 11/18/2018 0430   ALBUMIN 4.4 10/11/2018 1115   AST 20 11/18/2018 0430   ALT 20 11/18/2018 0430   ALKPHOS 42 11/18/2018 0430   BILITOT 0.3 11/18/2018 0430   BILITOT 0.2 10/11/2018 1115   GFRNONAA >60 11/27/2018 0536   GFRAA >60 11/27/2018 0536   Lipase     Component Value Date/Time   LIPASE 34  11/18/2018 0430       Studies/Results: No results found.  Anti-infectives: Anti-infectives (From admission, onward)   Start     Dose/Rate Route Frequency Ordered Stop   11/19/18 1223  clindamycin (CLEOCIN) IVPB 900 mg     900 mg 100 mL/hr over 30 Minutes Intravenous 60 min pre-op 11/19/18 1223 11/19/18 1430   11/19/18 1223  gentamicin (GARAMYCIN) 390 mg in dextrose 5 % 100 mL IVPB     5 mg/kg  78.8 kg 109.8 mL/hr over 60 Minutes Intravenous 60 min pre-op 11/19/18 1223 11/19/18 1500       Assessment/Plan HTN  HLD Asthma-TRH tapering prednisone Thyroid disease Deaf  Right colon mass, colonic obstruction S/pRight colectomy11/6 [name] - POD#8 - surgical path: Invasive adenocarcinoma with 2/14 positive lymph nodes >>Oncology plans to see outpatient - JP drain SS; remove Postop ileus  ID -abx periop FEN -decrease IVF, try FLD today and schedule reglan VTE -SCDs, lovenox Foley -d/c 11/7 Follow up -[name]   Plan: Advance diet to FLD this AM and schedule reglan. Mobilize. Will ensure patient has surgical follow up.   LOS: 8 days    Clovis Riley , [facility] 11/27/2018, 10:21 AM Please see Amion for pager number during day hours 7:00am-4:30pm

## 2018-11-28 DIAGNOSIS — R1030 Lower abdominal pain, unspecified: Secondary | ICD-10-CM

## 2018-11-28 LAB — COMPREHENSIVE METABOLIC PANEL
ALT: 32 U/L (ref 0–44)
AST: 25 U/L (ref 15–41)
Albumin: 2.8 g/dL — ABNORMAL LOW (ref 3.5–5.0)
Alkaline Phosphatase: 68 U/L (ref 38–126)
Anion gap: 11 (ref 5–15)
BUN: 8 mg/dL (ref 6–20)
CO2: 23 mmol/L (ref 22–32)
Calcium: 8.6 mg/dL — ABNORMAL LOW (ref 8.9–10.3)
Chloride: 106 mmol/L (ref 98–111)
Creatinine, Ser: 0.89 mg/dL (ref 0.61–1.24)
GFR calc Af Amer: >60 mL/min (ref >60)
GFR calc non Af Amer: >60 mL/min (ref >60)
Glucose, Bld: 101 mg/dL — ABNORMAL HIGH (ref 70–99)
Potassium: 3.7 mmol/L (ref 3.5–5.1)
Sodium: 140 mmol/L (ref 135–145)
Total Bilirubin: 0.5 mg/dL (ref 0.3–1.2)
Total Protein: 6.1 g/dL — ABNORMAL LOW (ref 6.5–8.1)

## 2018-11-28 LAB — CBC
HCT: 32.8 % — ABNORMAL LOW (ref 39.0–52.0)
Hemoglobin: 10.5 g/dL — ABNORMAL LOW (ref 13.0–17.0)
MCH: 26.5 pg (ref 26.0–34.0)
MCHC: 32 g/dL (ref 30.0–36.0)
MCV: 82.8 fL (ref 80.0–100.0)
Platelets: 379 10*3/uL (ref 150–400)
RBC: 3.96 MIL/uL — ABNORMAL LOW (ref 4.22–5.81)
RDW: 14.6 % (ref 11.5–15.5)
WBC: 8.1 10*3/uL (ref 4.0–10.5)
nRBC: 0 % (ref 0.0–0.2)

## 2018-11-28 MED ORDER — PANTOPRAZOLE SODIUM 40 MG PO TBEC
40.0000 mg | DELAYED_RELEASE_TABLET | Freq: Every day | ORAL | Status: DC
  Administered 2018-11-29 – 2018-12-02 (×4): 40 mg via ORAL
  Filled 2018-11-28 (×4): qty 1

## 2018-11-28 NOTE — Progress Notes (Signed)
9 Days Post-Op   Subjective/Chief Complaint: Finally had a BM and is passing flatus   Objective: Vital signs in last 24 hours: Temp:  [97.6 F (36.4 C)-98.9 F (37.2 C)] 97.6 F (36.4 C) (11/15 0918) Pulse Rate:  [96-104] 98 (11/15 0918) Resp:  [16-20] 18 (11/15 0918) BP: (110-128)/(73-85) 110/85 (11/15 0918) SpO2:  [98 %-100 %] 98 % (11/15 0918) Weight:  [78 kg] 78 kg (11/15 0606) Last BM Date: 11/27/18  Intake/Output from previous day: 11/14 0701 - 11/15 0700 In: 2467.3 [I.V.:2467.3] Out: -  Intake/Output this shift: No intake/output data recorded.  Exam: Awake and alert Abdomen still a little full Wound clean, drain out  Lab Results:  Recent Labs    11/27/18 0536 11/28/18 0219  WBC 8.0 8.1  HGB 10.5* 10.5*  HCT 33.0* 32.8*  PLT 337 379   BMET Recent Labs    11/27/18 0536 11/28/18 0219  NA 138 140  K 4.0 3.7  CL 105 106  CO2 19* 23  GLUCOSE 101* 101*  BUN 6 8  CREATININE 1.07 0.89  CALCIUM 8.8* 8.6*   PT/INR No results for input(s): LABPROT, INR in the last 72 hours. ABG No results for input(s): PHART, HCO3 in the last 72 hours.  Invalid input(s): PCO2, PO2  Studies/Results: Kub  Result Date: 11/27/2018 CLINICAL DATA:  Abdominal pain and vomiting EXAM: DG ABDOMEN ACUTE W/ 1V CHEST COMPARISON:  CT 11/18/2018, radiographs 11/22/2018, chest x-ray 10/17/2018, 12/31/2017 FINDINGS: Single-view chest demonstrates linear atelectasis at the lingula and left base. Attenuated lung opacities at the periphery of the left mid to lower lung but without discrete pleural line to suggest in pneumothorax. Stable cardiomediastinal silhouette. Supine and upright views of the abdomen demonstrate cutaneous staples at the midline. Slight decreased small and large bowel distension since prior radiograph. Calcified phleboliths in the pelvis IMPRESSION: 1. Linear atelectasis or scarring at the lingula and left base. 2. Slight interval decrease in gaseous dilatation of small  and large bowel Electronically Signed   By: Donavan Foil M.D.   On: 11/27/2018 17:11    Anti-infectives: Anti-infectives (From admission, onward)   Start     Dose/Rate Route Frequency Ordered Stop   11/19/18 1223  clindamycin (CLEOCIN) IVPB 900 mg     900 mg 100 mL/hr over 30 Minutes Intravenous 60 min pre-op 11/19/18 1223 11/19/18 1430   11/19/18 1223  gentamicin (GARAMYCIN) 390 mg in dextrose 5 % 100 mL IVPB     5 mg/kg  78.8 kg 109.8 mL/hr over 60 Minutes Intravenous 60 min pre-op 11/19/18 1223 11/19/18 1500      Assessment/Plan: s/p Procedure(s): EXPLORATORY LAPAROTOMY (N/A) PARTIAL COLECTOMY (Right)  Post op ileus slowly resolving WBC normal and no fevers   Continue to ambulate and advance   LOS: 9 days    Coralie Keens 11/28/2018

## 2018-11-29 ENCOUNTER — Inpatient Hospital Stay (HOSPITAL_COMMUNITY): Payer: Medicare Other

## 2018-11-29 DIAGNOSIS — K567 Ileus, unspecified: Secondary | ICD-10-CM

## 2018-11-29 IMAGING — DX DG ABD PORTABLE 1V
1 series · 1 of 1 positions shown · non-contrast
Comparison: [DATE]

CLINICAL DATA: Postoperative ileus.

EXAM:
PORTABLE ABDOMEN - 1 VIEW

[abdomen kub]
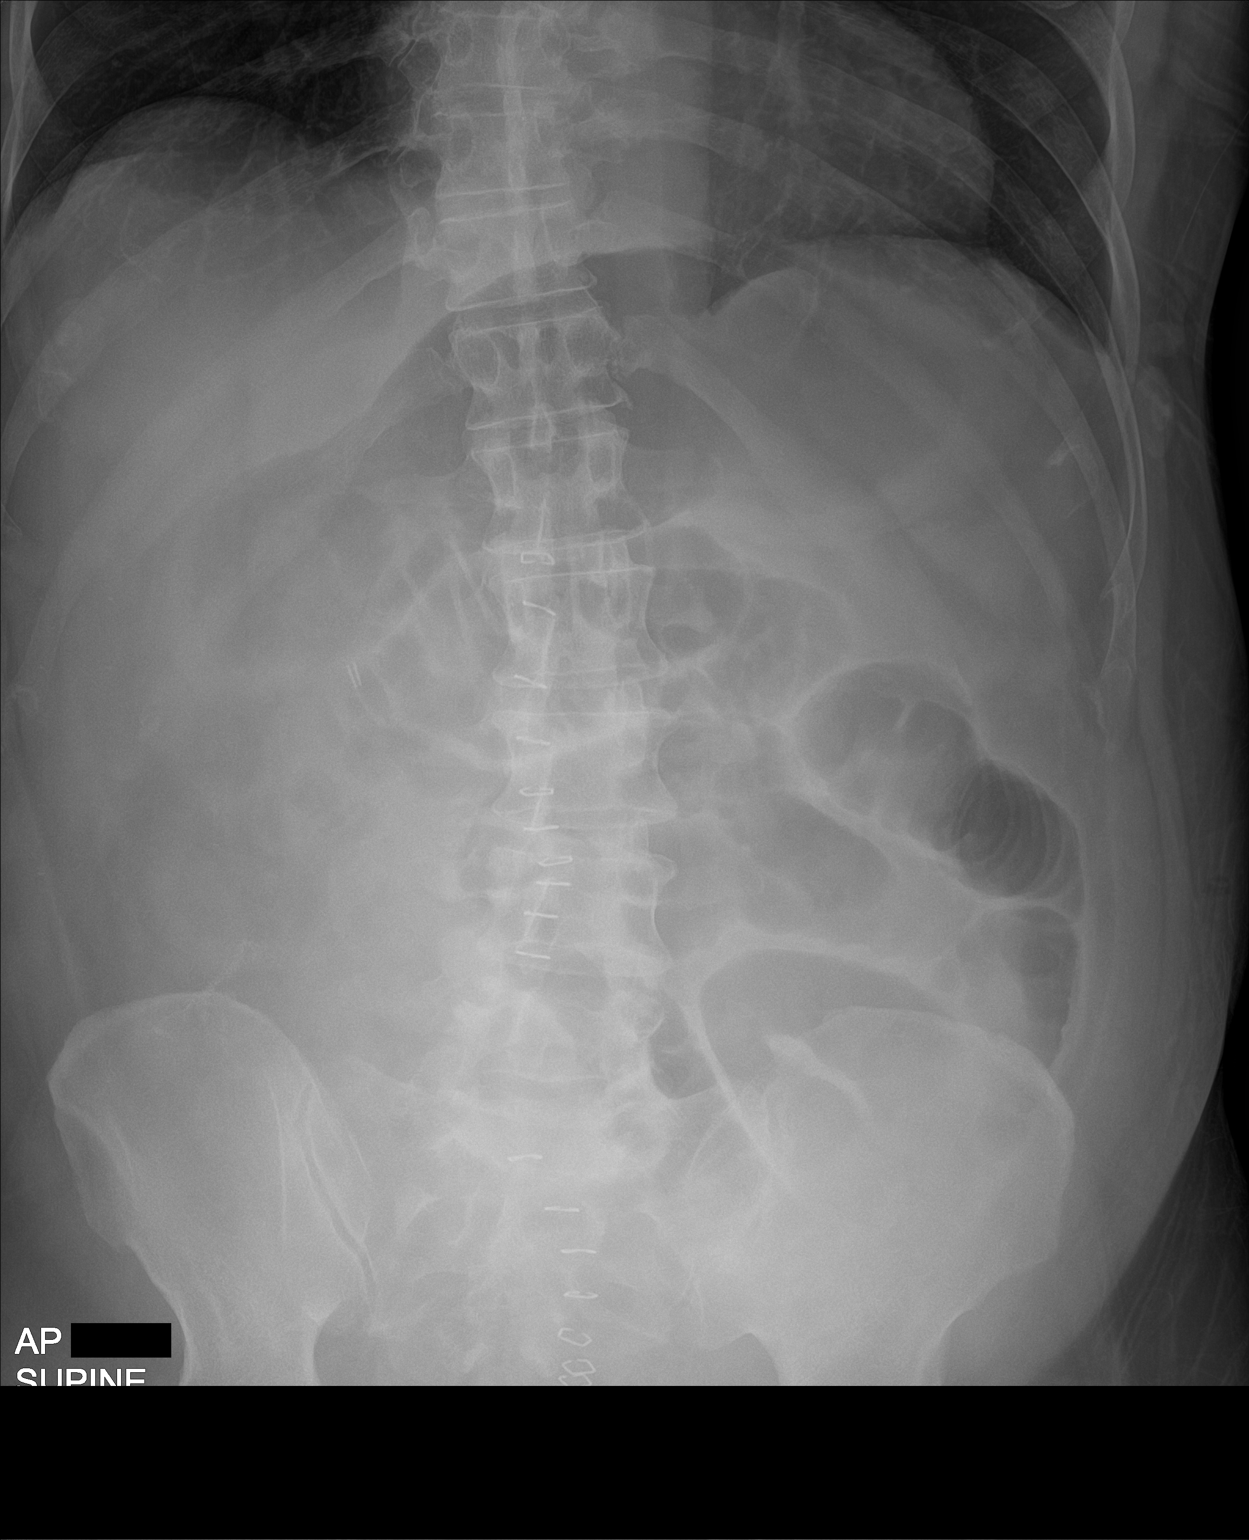

[1 of 1 positions shown; findings below may reference images not displayed]

FINDINGS: There is persistent mild dilatation of multiple gas-filled loops of
small bowel predominantly in the left mid abdomen, similar to the
prior study. There is no significant colonic dilatation. Gas is
present in nondilated stomach. Abdominal skin staples are noted.
IMPRESSION: Unchanged mild small bowel dilatation.

## 2018-11-29 MED ORDER — METOCLOPRAMIDE HCL 5 MG/ML IJ SOLN
5.0000 mg | Freq: Four times a day (QID) | INTRAMUSCULAR | Status: DC
  Administered 2018-11-29 – 2018-11-30 (×5): 5 mg via INTRAVENOUS
  Filled 2018-11-29 (×5): qty 2

## 2018-11-29 MED ORDER — PROMETHAZINE HCL 25 MG/ML IJ SOLN
12.5000 mg | Freq: Four times a day (QID) | INTRAMUSCULAR | Status: DC | PRN
  Administered 2018-11-29 – 2018-12-02 (×5): 12.5 mg via INTRAVENOUS
  Filled 2018-11-29 (×5): qty 1

## 2018-11-29 NOTE — Progress Notes (Signed)
PROGRESS NOTE    Alexander Reeves  [id number] DOB: 1965-10-13 DOA: 11/18/2018 PCP: [name], NP   Brief Narrative:  ALEKSA Reeves Sr.is a 53 year old male presenting with with 1 day history of right lower quadrant abdominal pain,, and noted to have and noted to have right colonic neoplasm right colonic neoplasm. Patient underwent right colectomy for right colon mass on 11/19/2018.   Assessment & Plan:   Active Problems:   Abdominal pain   Abnormal CT scan, colon   Malignant neoplasm of ascending colon [facility])   Colon cancer (HCC)  Right colon masswith new diagnosis of invasive adenocarcinoma -CEA 2.7 -Status post right colectomy 11/6 by [name] - colonoscopy 11/19/2018 by [name] -Surgical pathologywith noted invasive adenocarcinoma.  -Oncology  follow up outpatient.   Postop ileus-persistent improving bowel rest-advance diet as tolerated  Supportive anti nauseants and antiemetics  Monitor renal function with  Fluid and electrolytes status   Asthma - stable   DVT prophylaxis:Lovenox Code Status:Full code Family Communication: Disposition Plan:Anticipate discharge . if tolerating diet.  Will need oncology follow-up for new diagnosis of invasive adenocarcinoma.   Consultants:  GI  General surgery  Antimicrobials:  Anti-infectives (From admission, onward)   Start     Dose/Rate Route Frequency Ordered Stop   11/19/18 1223  clindamycin (CLEOCIN) IVPB 900 mg     900 mg 100 mL/hr over 30 Minutes Intravenous 60 min pre-op 11/19/18 1223 11/19/18 1430   11/19/18 1223  gentamicin (GARAMYCIN) 390 mg in dextrose 5 % 100 mL IVPB     5 mg/kg  78.8 kg 109.8 mL/hr over 60 Minutes Intravenous 60 min pre-op 11/19/18 1223 11/19/18 1500      Subjective: Patient seen and evaluated today . No more vomitting episodes overnight.  Objective: Vitals:   11/28/18 0918 11/28/18 1155 11/28/18 1600 11/29/18 0131  BP: 110/85  129/88 130/87 119/79  Pulse: 98 96 94 (!) 103  Resp: 18 17 18 18   Temp: 97.6 F (36.4 C) 98.5 F (36.9 C) 97.8 F (36.6 C) 99 F (37.2 C)  TempSrc: Oral Oral Oral Oral  SpO2: 98% 98% 98% 98%  Weight:    75.5 kg  Height:        Intake/Output Summary (Last 24 hours) at 11/29/2018 0607 Last data filed at 11/28/2018 2015 Gross per 24 hour  Intake 1349.81 ml  Output 200 ml  Net 1149.81 ml   Filed Weights   11/26/18 0002 11/28/18 0606 11/29/18 0131  Weight: 75.2 kg 78 kg 75.5 kg    Examination:  General exam: Appears calm and comfortable  Respiratory system: Clear to auscultation. Respiratory effort normal. Cardiovascular system: S1 & S2 heard, RRR. No JVD, murmurs, rubs, gallops or clicks. No pedal edema. Gastrointestinal system: Abdomen is mildly distended, mildly tender. Central nervous system: Alert and oriented. No focal neurological deficits. Extremities: Symmetric 5 x 5 power. Skin: No rashes, lesions or ulcers Psychiatry: Judgement and insight appear normal. Mood & affect appropriate.     Data Reviewed: I have personally reviewed following labs and imaging studies  CBC: Recent Labs  Lab 11/24/18 0506 11/25/18 0633 11/26/18 0545 11/27/18 0536 11/28/18 0219  WBC 7.4 8.9 7.8 8.0 8.1  HGB 10.3* 9.8* 10.3* 10.5* 10.5*  HCT 33.2* 32.2* 33.4* 33.0* 32.8*  MCV 84.7 85.2 85.6 82.3 82.8  PLT 230 250 241 337 [id number]   Basic Metabolic Panel: Recent Labs  Lab 11/22/18 0608 11/23/18 0533 11/24/18 0506 11/25/18 [id number] 11/26/18 0545 11/27/18 0536 11/28/18  0219  NA 138 140 140 139 137 138 140  K 3.7 3.6 3.8 4.1 4.1 4.0 3.7  CL 104 107 106 106 105 105 106  CO2 22 22 21* 20* 17* 19* 23  GLUCOSE 110* 75 74 83 86 101* 101*  BUN 7 8 7 6  <5* 6 8  CREATININE 1.11 1.16 1.09 1.14 0.80 1.07 0.89  CALCIUM 8.5* 8.0* 8.2* 8.3* 8.4* 8.8* 8.6*  MG 2.2 2.0 2.0 2.0 1.9  --   --    GFR: Estimated Creatinine Clearance: 101.4 mL/min (by C-G formula based on SCr of 0.89 mg/dL).  Liver Function Tests: Recent Labs  Lab 11/28/18 0219  AST 25  ALT 32  ALKPHOS 68  BILITOT 0.5  PROT 6.1*  ALBUMIN 2.8*   No results for input(s): LIPASE, AMYLASE in the last 168 hours. No results for input(s): AMMONIA in the last 168 hours. Coagulation Profile: No results for input(s): INR, PROTIME in the last 168 hours. Cardiac Enzymes: No results for input(s): CKTOTAL, CKMB, CKMBINDEX, TROPONINI in the last 168 hours. BNP (last 3 results) No results for input(s): PROBNP in the last 8760 hours. HbA1C: No results for input(s): HGBA1C in the last 72 hours. CBG: No results for input(s): GLUCAP in the last 168 hours. Lipid Profile: No results for input(s): CHOL, HDL, LDLCALC, TRIG, CHOLHDL, LDLDIRECT in the last 72 hours. Thyroid Function Tests: No results for input(s): TSH, T4TOTAL, FREET4, T3FREE, THYROIDAB in the last 72 hours. Anemia Panel: No results for input(s): VITAMINB12, FOLATE, FERRITIN, TIBC, IRON, RETICCTPCT in the last 72 hours. Sepsis Labs: No results for input(s): PROCALCITON, LATICACIDVEN in the last 168 hours.  No results found for this or any previous visit (from the past 240 hour(s)).       Radiology Studies: Kub  Result Date: 11/27/2018 CLINICAL DATA:  Abdominal pain and vomiting EXAM: DG ABDOMEN ACUTE W/ 1V CHEST COMPARISON:  CT 11/18/2018, radiographs 11/22/2018, chest x-ray 10/17/2018, 12/31/2017 FINDINGS: Single-view chest demonstrates linear atelectasis at the lingula and left base. Attenuated lung opacities at the periphery of the left mid to lower lung but without discrete pleural line to suggest in pneumothorax. Stable cardiomediastinal silhouette. Supine and upright views of the abdomen demonstrate cutaneous staples at the midline. Slight decreased small and large bowel distension since prior radiograph. Calcified phleboliths in the pelvis IMPRESSION: 1. Linear atelectasis or scarring at the lingula and left base. 2. Slight interval decrease in  gaseous dilatation of small and large bowel Electronically Signed   By: Donavan Foil M.D.   On: 11/27/2018 17:11        Scheduled Meds: . acetaminophen  650 mg Oral Q6H  . chlorhexidine  15 mL Mouth Rinse BID  . Chlorhexidine Gluconate Cloth  6 each Topical Daily  . enoxaparin (LOVENOX) injection  40 mg Subcutaneous Q24H  . feeding supplement  1 Container Oral TID BM  . fluticasone furoate-vilanterol  1 puff Inhalation Daily  . loratadine  10 mg Oral Daily  . mouth rinse  15 mL Mouth Rinse q12n4p  . methocarbamol  500 mg Oral TID  . metoCLOPramide (REGLAN) injection  10 mg Intravenous Q6H  . montelukast  10 mg Oral QHS  . pantoprazole  40 mg Oral Daily  . sodium chloride flush  3 mL Intravenous Once   Continuous Infusions: . 0.9 % NaCl with KCl 20 mEq / L 50 mL/hr at 11/28/18 2334     LOS: 10 days    Time spent:25 minutes    Iona Beard  Osei-Bonsu,  MD [facility] Pager [phone]  If 7PM-7AM, please contact night-coverage [email or web address] Password [email or web address] 11/29/2018, 6:07 AM

## 2018-11-29 NOTE — Progress Notes (Signed)
Patient vomited 679ml of yellow/greenish emesis. VSS. Scheduled Reglan given. On call surgery paged. Will continue to monitor.

## 2018-11-29 NOTE — Progress Notes (Signed)
[facility] Progress Note  10 Days Post-Op  Subjective: CC: emesis Patient with increased nausea and vomiting overnight, vomited large amount of brown foul smelling emesis. Some nausea still this AM. Patient feels a little bloated still, more in R abdomen. Denies increased pain. Still having bowel function.   Objective: Vital signs in last 24 hours: Temp:  [97.6 F (36.4 C)-99 F (37.2 C)] 98 F (36.7 C) (11/16 0609) Pulse Rate:  [94-103] 103 (11/16 0609) Resp:  [17-18] 18 (11/16 0609) BP: (110-140)/(79-88) 140/81 (11/16 0609) SpO2:  [97 %-98 %] 97 % (11/16 0609) Weight:  [75.5 kg] 75.5 kg (11/16 0131) Last BM Date: 11/27/18  Intake/Output from previous day: 11/15 0701 - 11/16 0700 In: 2149 [P.O.:840; I.V.:1309] Out: 1050 [Emesis/NG output:1050] Intake/Output this shift: No intake/output data recorded.  PE: Gen:  Alert, NAD, pleasant Card:  Regular rate and rhythm, pedal pulses 2+ BL Pulm:  Normal effort, clear to auscultation bilaterally Abd: Soft, mildly ttp along incision, mildly distended, +BS, incision with some purulent looking drainage at umbilicus Skin: warm and dry, no rashes  Psych: A&Ox3   Lab Results:  Recent Labs    11/27/18 0536 11/28/18 0219  WBC 8.0 8.1  HGB 10.5* 10.5*  HCT 33.0* 32.8*  PLT 337 379   BMET Recent Labs    11/27/18 0536 11/28/18 0219  NA 138 140  K 4.0 3.7  CL 105 106  CO2 19* 23  GLUCOSE 101* 101*  BUN 6 8  CREATININE 1.07 0.89  CALCIUM 8.8* 8.6*   PT/INR No results for input(s): LABPROT, INR in the last 72 hours. CMP     Component Value Date/Time   NA 140 11/28/2018 0219   NA 141 10/11/2018 1115   K 3.7 11/28/2018 0219   CL 106 11/28/2018 0219   CO2 23 11/28/2018 0219   GLUCOSE 101 (H) 11/28/2018 0219   BUN 8 11/28/2018 0219   BUN 9 10/11/2018 1115   CREATININE 0.89 11/28/2018 0219   CALCIUM 8.6 (L) 11/28/2018 0219   PROT 6.1 (L) 11/28/2018 0219   PROT 7.1 10/11/2018 1115   ALBUMIN 2.8 (L)  11/28/2018 0219   ALBUMIN 4.4 10/11/2018 1115   AST 25 11/28/2018 0219   ALT 32 11/28/2018 0219   ALKPHOS 68 11/28/2018 0219   BILITOT 0.5 11/28/2018 0219   BILITOT 0.2 10/11/2018 1115   GFRNONAA >60 11/28/2018 0219   GFRAA >60 11/28/2018 0219   Lipase     Component Value Date/Time   LIPASE 34 11/18/2018 0430       Studies/Results: Kub  Result Date: 11/27/2018 CLINICAL DATA:  Abdominal pain and vomiting EXAM: DG ABDOMEN ACUTE W/ 1V CHEST COMPARISON:  CT 11/18/2018, radiographs 11/22/2018, chest x-ray 10/17/2018, 12/31/2017 FINDINGS: Single-view chest demonstrates linear atelectasis at the lingula and left base. Attenuated lung opacities at the periphery of the left mid to lower lung but without discrete pleural line to suggest in pneumothorax. Stable cardiomediastinal silhouette. Supine and upright views of the abdomen demonstrate cutaneous staples at the midline. Slight decreased small and large bowel distension since prior radiograph. Calcified phleboliths in the pelvis IMPRESSION: 1. Linear atelectasis or scarring at the lingula and left base. 2. Slight interval decrease in gaseous dilatation of small and large bowel Electronically Signed   By: Donavan Foil M.D.   On: 11/27/2018 17:11    Anti-infectives: Anti-infectives (From admission, onward)   Start     Dose/Rate Route Frequency Ordered Stop   11/19/18 1223  clindamycin (CLEOCIN) IVPB 900  mg     900 mg 100 mL/hr over 30 Minutes Intravenous 60 min pre-op 11/19/18 1223 11/19/18 1430   11/19/18 1223  gentamicin (GARAMYCIN) 390 mg in dextrose 5 % 100 mL IVPB     5 mg/kg  78.8 kg 109.8 mL/hr over 60 Minutes Intravenous 60 min pre-op 11/19/18 1223 11/19/18 1500       Assessment/Plan HTN  HLD Asthma-TRH tapering prednisone Thyroid disease Deaf  Right colon mass, colonic obstruction S/pRight colectomy11/6 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  Plan: check abdominal film, possible CT but will discuss with MD as patient has no WBC or fever and abdomen not very tender  LOS: 10 days    Brigid Re , [facility] 11/29/2018, 8:51 AM Please see Amion for pager number during day hours 7:00am-4:30pm

## 2018-11-29 NOTE — Progress Notes (Signed)
Pt vomited 950 cc brownish emesis interrmittently during the night. PRN zofran given not not effective. Will continue to monitor pt.

## 2018-11-29 NOTE — Progress Notes (Signed)
PROGRESS NOTE    Alexander Reeves  [id number] DOB: 16-Dec-1965 DOA: 11/18/2018 PCP: [name], NP    Brief Narrative:  Alexander Reevesis a 53 year old male presenting with with 1 day history of right lower quadrant abdominal pain,, and noted to have and noted to have right colonic neoplasm right colonic neoplasm. Patient underwent right colectomy for right colon mass on 11/19/2018.    Consultants:   GI, general surgery  Procedures: s/p right colectomy 11/16  Antimicrobials:   Was on clindamycin and gentamicin   Subjective: Patient uses sign language interpreter to answer questions.  Reported vomiting overnight till this morning.  It is improved now.  Has minimal nausea.  Positive flatus and bowel movement.  No fever or chills or other complaints  Objective: Vitals:   11/28/18 1600 11/29/18 0131 11/29/18 0609 11/29/18 1158  BP: 130/87 119/79 140/81 122/78  Pulse: 94 (!) 103 (!) 103 89  Resp: 18 18 18 16   Temp: 97.8 F (36.6 C) 99 F (37.2 C) 98 F (36.7 C) 98.8 F (37.1 C)  TempSrc: Oral Oral Oral Oral  SpO2: 98% 98% 97% 100%  Weight:  75.5 kg    Height:        Intake/Output Summary (Last 24 hours) at 11/29/2018 1200 Last data filed at 11/29/2018 0611 Gross per 24 hour  Intake 1534.75 ml  Output 1050 ml  Net 484.75 ml   Filed Weights   11/26/18 0002 11/28/18 0606 11/29/18 0131  Weight: 75.2 kg 78 kg 75.5 kg    Examination:  General exam: Appears calm and comfortable , NAD Respiratory system: Clear to auscultation. Respiratory effort normal.  Wheeze rales rhonchi Cardiovascular system: S1 & S2 heard, RRR. No JVD, murmurs, or gallops. Gastrointestinal system: Abdomen  distended, midline incision/dressing in place. Decrease bs. Mild ttp Central nervous system: Alert and oriented x3. No focal neurological deficits. Extremities: no edema b/l Skin: Warm dry Psychiatry:Mood & affect appropriate.     Data Reviewed: I have personally  reviewed following labs and imaging studies  CBC: Recent Labs  Lab 11/24/18 0506 11/25/18 0633 11/26/18 0545 11/27/18 0536 11/28/18 0219  WBC 7.4 8.9 7.8 8.0 8.1  HGB 10.3* 9.8* 10.3* 10.5* 10.5*  HCT 33.2* 32.2* 33.4* 33.0* 32.8*  MCV 84.7 85.2 85.6 82.3 82.8  PLT 230 250 241 337 [id number]   Basic Metabolic Panel: Recent Labs  Lab 11/23/18 0533 11/24/18 0506 11/25/18 0633 11/26/18 0545 11/27/18 0536 11/28/18 0219  NA 140 140 139 137 138 140  K 3.6 3.8 4.1 4.1 4.0 3.7  CL 107 106 106 105 105 106  CO2 22 21* 20* 17* 19* 23  GLUCOSE 75 74 83 86 101* 101*  BUN 8 7 6  <5* 6 8  CREATININE 1.16 1.09 1.14 0.80 1.07 0.89  CALCIUM 8.0* 8.2* 8.3* 8.4* 8.8* 8.6*  MG 2.0 2.0 2.0 1.9  --   --    GFR: Estimated Creatinine Clearance: 101.4 mL/min (by C-G formula based on SCr of 0.89 mg/dL). Liver Function Tests: Recent Labs  Lab 11/28/18 0219  AST 25  ALT 32  ALKPHOS 68  BILITOT 0.5  PROT 6.1*  ALBUMIN 2.8*   No results for input(s): LIPASE, AMYLASE in the last 168 hours. No results for input(s): AMMONIA in the last 168 hours. Coagulation Profile: No results for input(s): INR, PROTIME in the last 168 hours. Cardiac Enzymes: No results for input(s): CKTOTAL, CKMB, CKMBINDEX, TROPONINI in the last 168 hours. BNP (last 3 results) No results  for input(s): PROBNP in the last 8760 hours. HbA1C: No results for input(s): HGBA1C in the last 72 hours. CBG: No results for input(s): GLUCAP in the last 168 hours. Lipid Profile: No results for input(s): CHOL, HDL, LDLCALC, TRIG, CHOLHDL, LDLDIRECT in the last 72 hours. Thyroid Function Tests: No results for input(s): TSH, T4TOTAL, FREET4, T3FREE, THYROIDAB in the last 72 hours. Anemia Panel: No results for input(s): VITAMINB12, FOLATE, FERRITIN, TIBC, IRON, RETICCTPCT in the last 72 hours. Sepsis Labs: No results for input(s): PROCALCITON, LATICACIDVEN in the last 168 hours.  No results found for this or any previous visit (from the  past 240 hour(s)).       Radiology Studies: Kub  Result Date: 11/27/2018 CLINICAL DATA:  Abdominal pain and vomiting EXAM: DG ABDOMEN ACUTE W/ 1V CHEST COMPARISON:  CT 11/18/2018, radiographs 11/22/2018, chest x-ray 10/17/2018, 12/31/2017 FINDINGS: Single-view chest demonstrates linear atelectasis at the lingula and left base. Attenuated lung opacities at the periphery of the left mid to lower lung but without discrete pleural line to suggest in pneumothorax. Stable cardiomediastinal silhouette. Supine and upright views of the abdomen demonstrate cutaneous staples at the midline. Slight decreased small and large bowel distension since prior radiograph. Calcified phleboliths in the pelvis IMPRESSION: 1. Linear atelectasis or scarring at the lingula and left base. 2. Slight interval decrease in gaseous dilatation of small and large bowel Electronically Signed   By: Donavan Foil M.D.   On: 11/27/2018 17:11   Dg Abd Portable 1v  Result Date: 11/29/2018 CLINICAL DATA:  Postoperative ileus. EXAM: PORTABLE ABDOMEN - 1 VIEW COMPARISON:  11/27/2018 FINDINGS: There is persistent mild dilatation of multiple gas-filled loops of small bowel predominantly in the left mid abdomen, similar to the prior study. There is no significant colonic dilatation. Gas is present in nondilated stomach. Abdominal skin staples are noted. IMPRESSION: Unchanged mild small bowel dilatation. Electronically Signed   By: Logan Bores M.D.   On: 11/29/2018 09:32        Scheduled Meds: . acetaminophen  650 mg Oral Q6H  . chlorhexidine  15 mL Mouth Rinse BID  . Chlorhexidine Gluconate Cloth  6 each Topical Daily  . enoxaparin (LOVENOX) injection  40 mg Subcutaneous Q24H  . feeding supplement  1 Container Oral TID BM  . fluticasone furoate-vilanterol  1 puff Inhalation Daily  . loratadine  10 mg Oral Daily  . mouth rinse  15 mL Mouth Rinse q12n4p  . methocarbamol  500 mg Oral TID  . metoCLOPramide (REGLAN) injection  5 mg  Intravenous Q6H  . montelukast  10 mg Oral QHS  . pantoprazole  40 mg Oral Daily  . sodium chloride flush  3 mL Intravenous Once   Continuous Infusions: . 0.9 % NaCl with KCl 20 mEq / L 50 mL/hr at 11/28/18 2334    Assessment & Plan:   Active Problems:   Abdominal pain   Abnormal CT scan, colon   Malignant neoplasm of ascending colon (HCC)   Colon cancer (HCC)  Right colon masswith new diagnosis of invasive adenocarcinoma -S/pRight colectomy11/6 Dr. Lowella Dandy #10 - colonoscopy 11/19/2018 by [name] -Surgical pathologywith noted invasive adenocarcinoma.  -Oncology  plans to see as outpatient -GSX following, will defer further mx to [place] [facility]....>Plan to obtain abd xr as pt with emesis overnight  Postop ileus-persistent bowel rest-advance diet as tolerated  Supportive anti nauseants and antiemetics  Monitor renal function with  Fluid and electrolytes status GSX mx..>see above On soft diet as tolerated  Asthma - stable, no exacerbation   DVT prophylaxis:Lovenox Code Status:Full code Family Communication: Disposition Plan:likely 1-2 more days, when stable from surgical stand point.    LOS: 10 days   Time spent: 45 minutes with more than 50% on [place], MD [facility] Pager [phone]  If 7PM-7AM, please contact night-coverage [email or web address] Password [email or web address] 11/29/2018, 12:00 PM

## 2018-11-30 ENCOUNTER — Inpatient Hospital Stay (HOSPITAL_COMMUNITY): Payer: Medicare Other

## 2018-11-30 DIAGNOSIS — J45909 Unspecified asthma, uncomplicated: Secondary | ICD-10-CM

## 2018-11-30 LAB — CBC
HCT: 31.9 % — ABNORMAL LOW (ref 39.0–52.0)
Hemoglobin: 10.1 g/dL — ABNORMAL LOW (ref 13.0–17.0)
MCH: 25.9 pg — ABNORMAL LOW (ref 26.0–34.0)
MCHC: 31.7 g/dL (ref 30.0–36.0)
MCV: 81.8 fL (ref 80.0–100.0)
Platelets: 399 10*3/uL (ref 150–400)
RBC: 3.9 MIL/uL — ABNORMAL LOW (ref 4.22–5.81)
RDW: 14.6 % (ref 11.5–15.5)
WBC: 5.6 10*3/uL (ref 4.0–10.5)
nRBC: 0 % (ref 0.0–0.2)

## 2018-11-30 LAB — BASIC METABOLIC PANEL
Anion gap: 13 (ref 5–15)
BUN: 5 mg/dL — ABNORMAL LOW (ref 6–20)
CO2: 22 mmol/L (ref 22–32)
Calcium: 8.7 mg/dL — ABNORMAL LOW (ref 8.9–10.3)
Chloride: 105 mmol/L (ref 98–111)
Creatinine, Ser: 0.91 mg/dL (ref 0.61–1.24)
GFR calc Af Amer: >60 mL/min (ref >60)
GFR calc non Af Amer: >60 mL/min (ref >60)
Glucose, Bld: 95 mg/dL (ref 70–99)
Potassium: 3.8 mmol/L (ref 3.5–5.1)
Sodium: 140 mmol/L (ref 135–145)

## 2018-11-30 LAB — GLUCOSE, CAPILLARY
Glucose-Capillary: 94 mg/dL (ref 70–99)
Glucose-Capillary: 83 mg/dL (ref 70–99)

## 2018-11-30 IMAGING — DX DG ABD PORTABLE 1V
1 series · 1 of 1 positions shown · non-contrast
Comparison: [DATE]

CLINICAL DATA: Ileus

EXAM:
PORTABLE ABDOMEN - 1 VIEW

[abdomen kub]
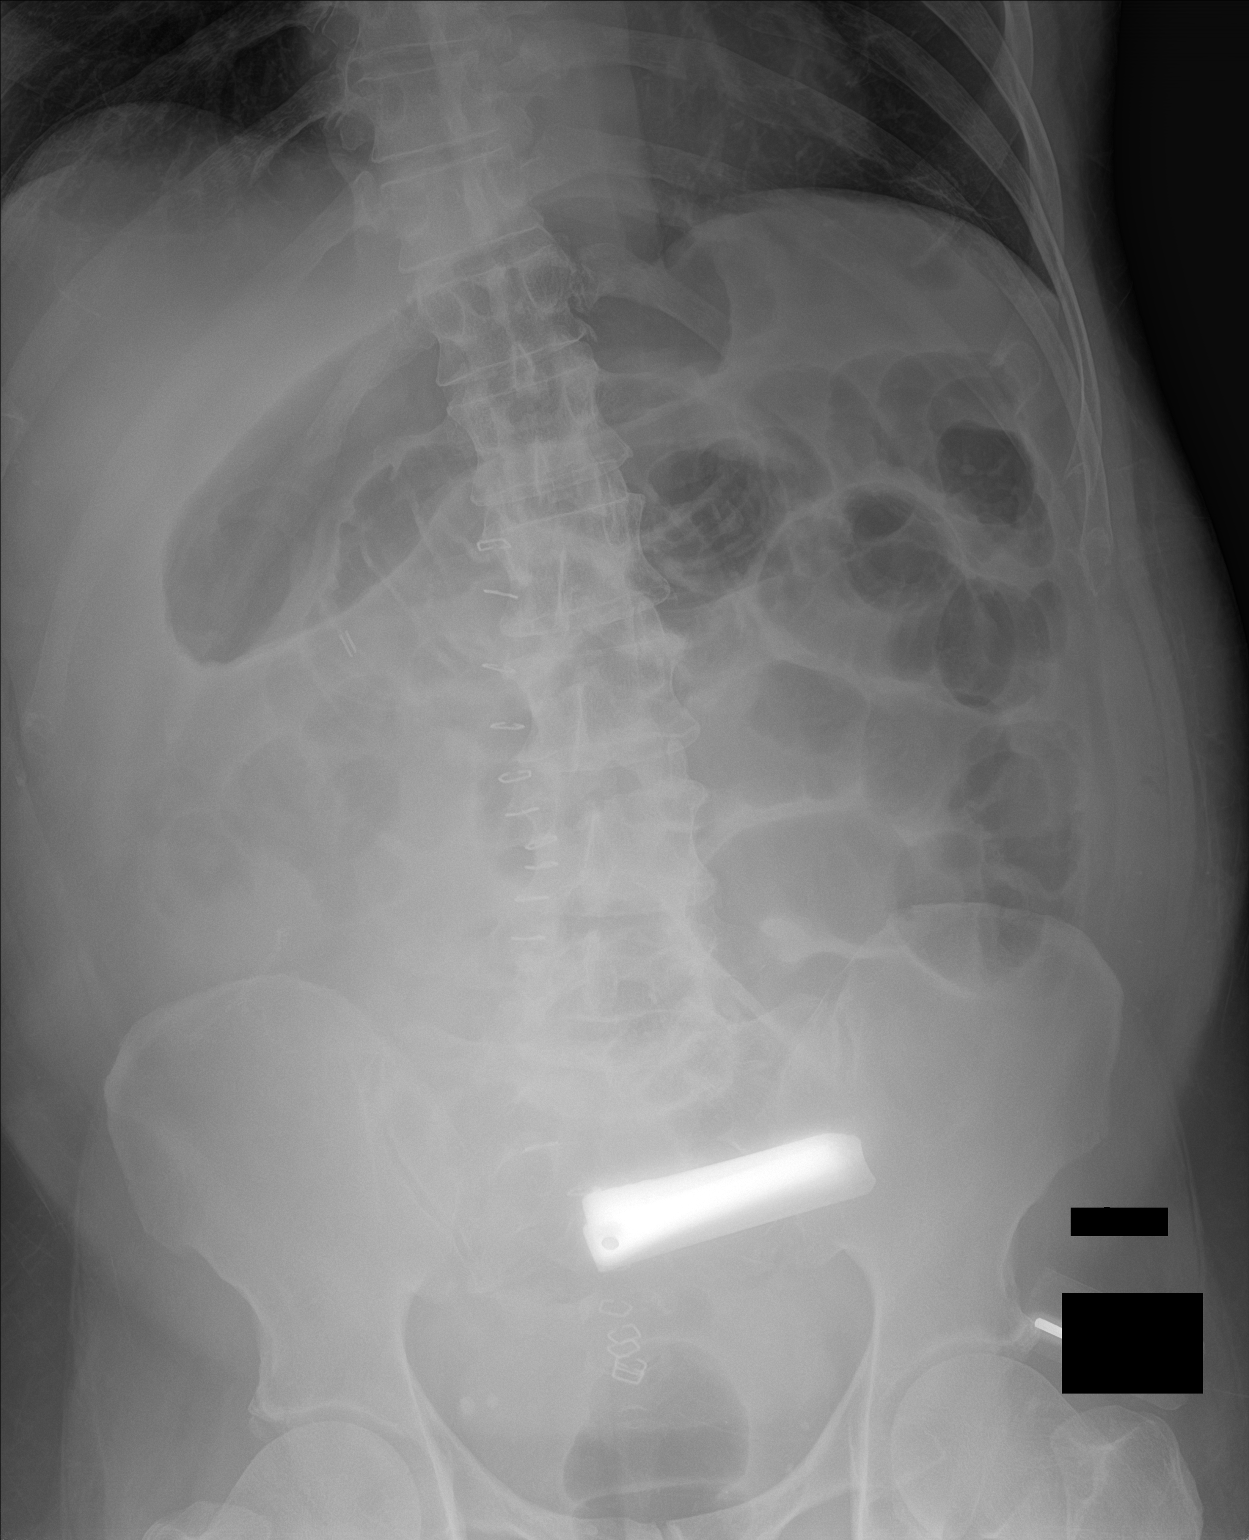

[1 of 1 positions shown; findings below may reference images not displayed]

FINDINGS: Skin staples noted. There remains mild generalized dilatation of
small bowel. No air-fluid levels. Air is noted in the rectum. No
free air. Lung bases are clear.
IMPRESSION: Persistent mild small bowel dilatation, likely postoperative ileus.
Air seen in rectum. No frank bowel obstruction. No free air evident.
Lung bases clear.

## 2018-11-30 IMAGING — CT CT ABD-PELV W/ CM
2 of 5 series · 15 of 46 positions shown, 17 images · IV contrast (APPLIED)
Comparison: [DATE]

CLINICAL DATA: Status post right colectomy on [DATE] colorectal
cancer. Nausea and vomiting.

EXAM:
CT ABDOMEN AND PELVIS WITH CONTRAST
TECHNIQUE: Multidetector CT imaging of the abdomen and pelvis was performed
using the standard protocol following bolus administration of
intravenous contrast.
CONTRAST:  100mL OMNIPAQUE IOHEXOL 300 MG/ML  SOLN

[Series 3: abd/ pelvis 5.0 i30f 2 · axial · 0.76mm/px · z∈[+875,+1270]mm · 12 of 89 slices shown, 14 images]
[im 5/89  soft-tissue]
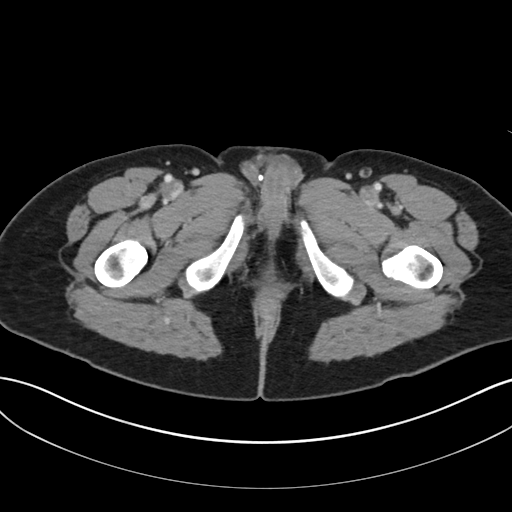
[im 5/89  bone]
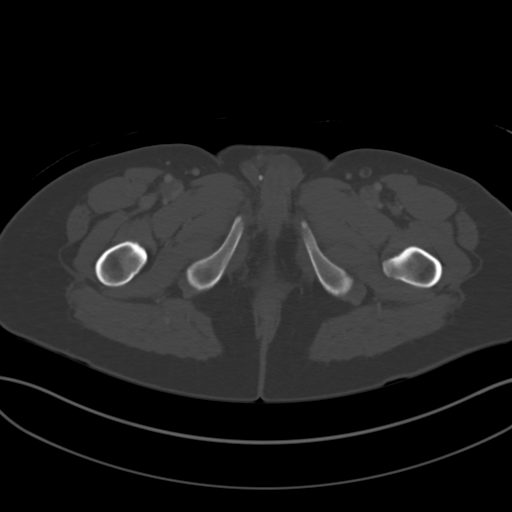
[im 15/89  soft-tissue]
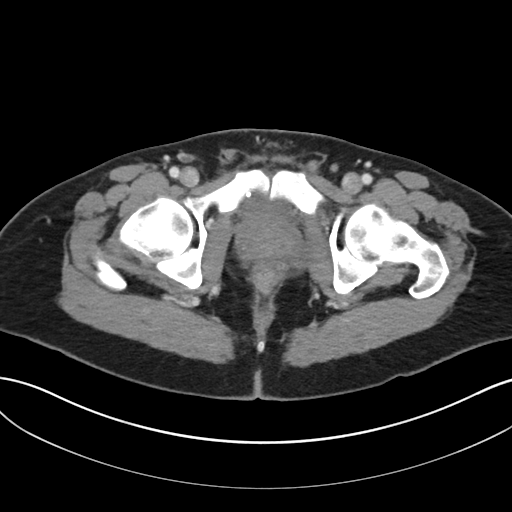
[im 20/89  soft-tissue]
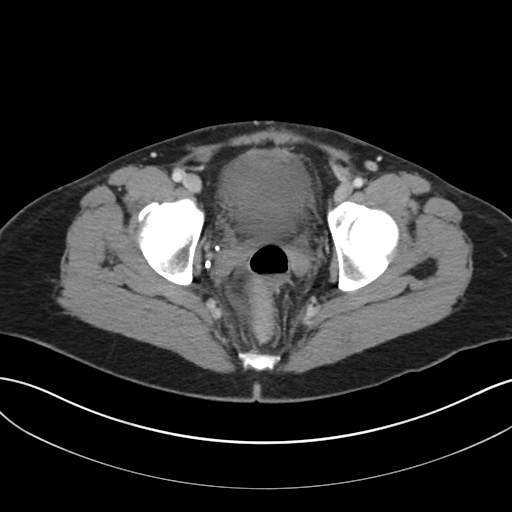
[im 25/89  soft-tissue]
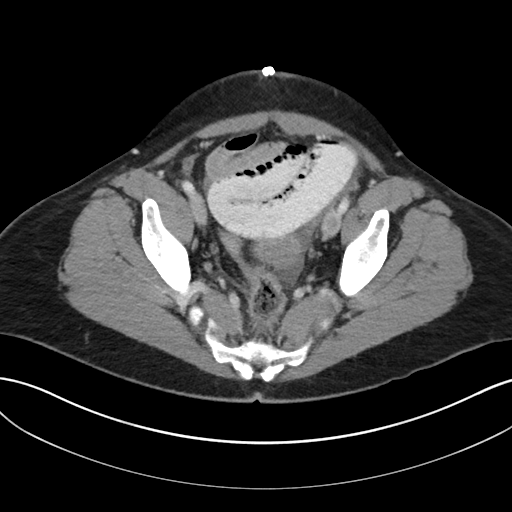
[im 35/89  soft-tissue]
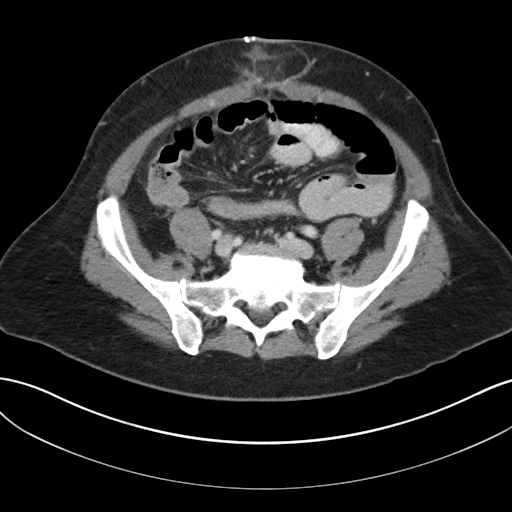
[im 40/89  soft-tissue]
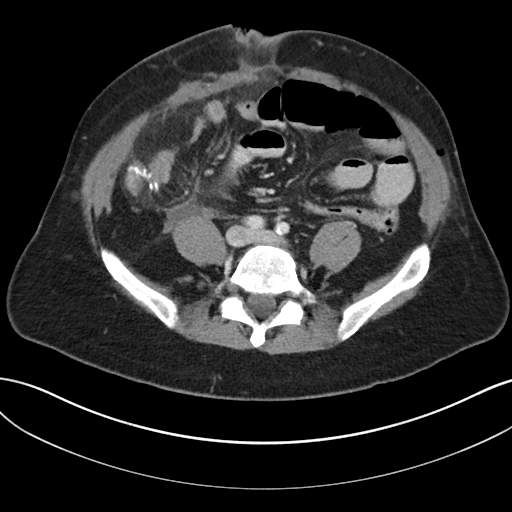
[im 49/89  soft-tissue]
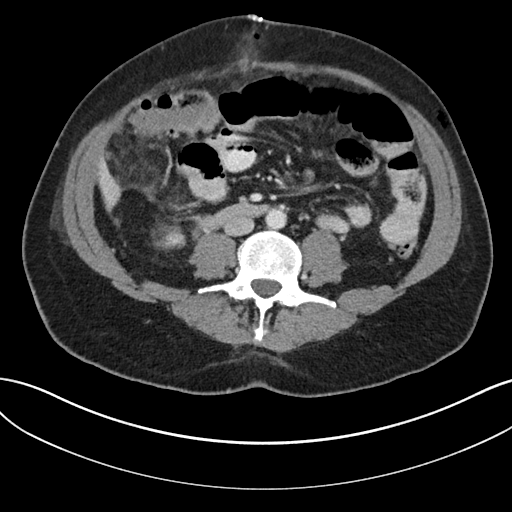
[im 54/89  soft-tissue]
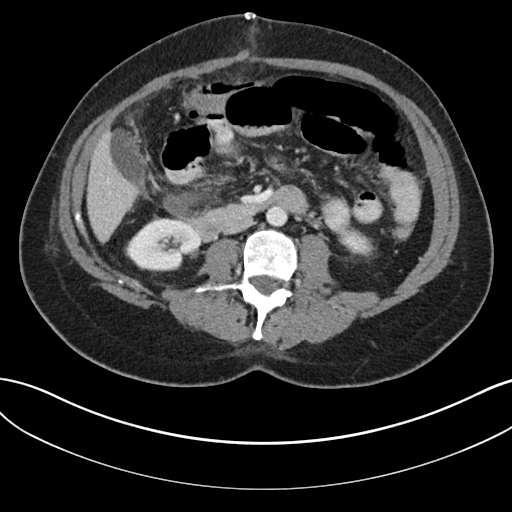
[im 64/89  soft-tissue]
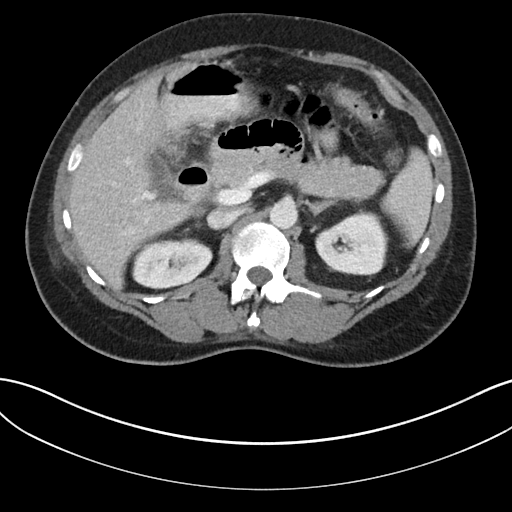
[im 64/89  bone]
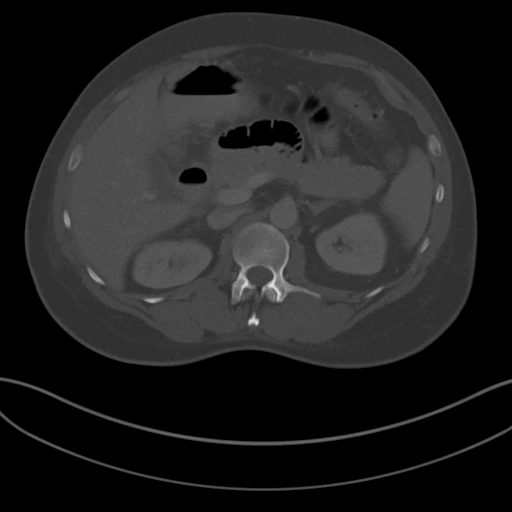
[im 69/89  soft-tissue]
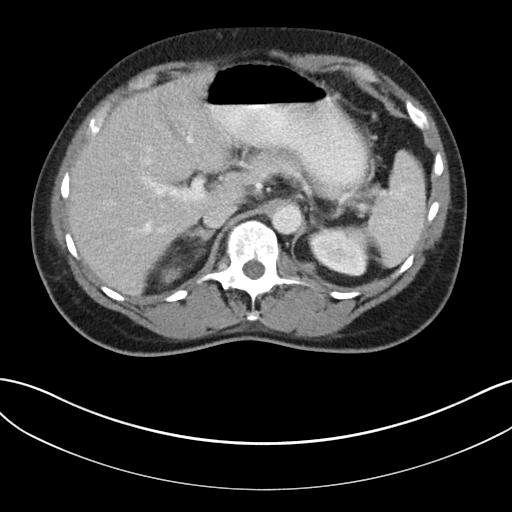
[im 74/89  soft-tissue]
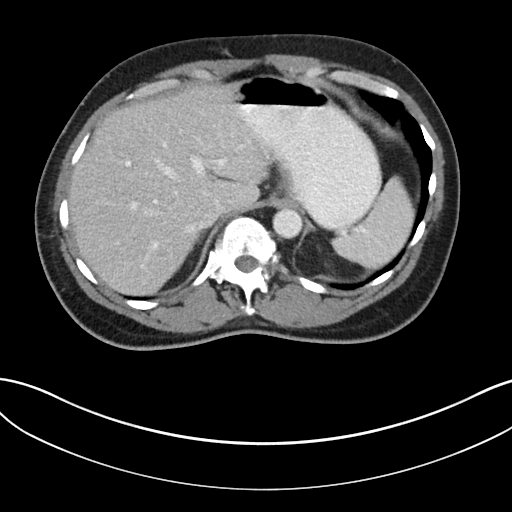
[im 84/89  soft-tissue]
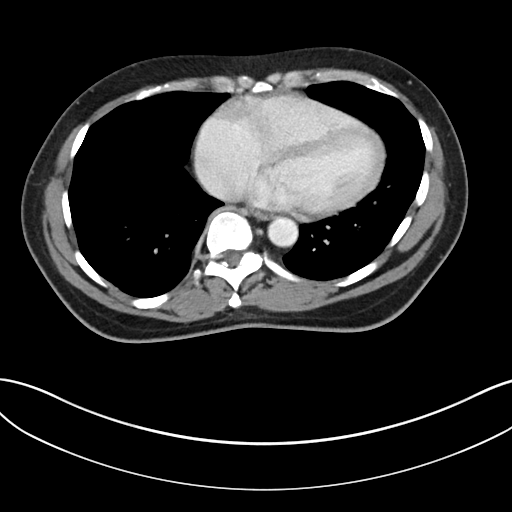

[Series 8: coronal soft tissue · coronal · 0.79mm/px · 3 of 101 slices shown]
[im 34/101  soft-tissue]
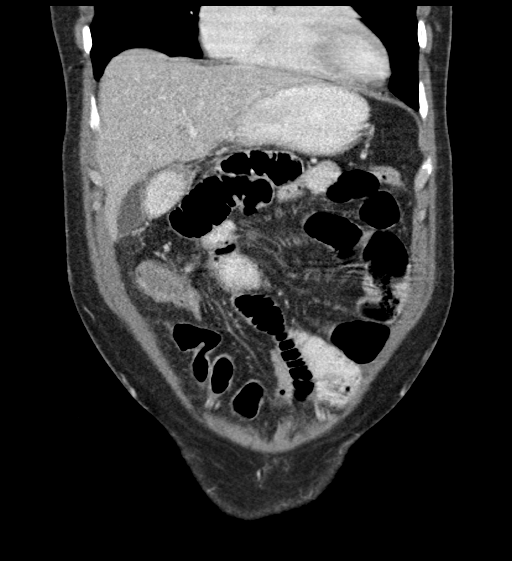
[im 45/101  soft-tissue]
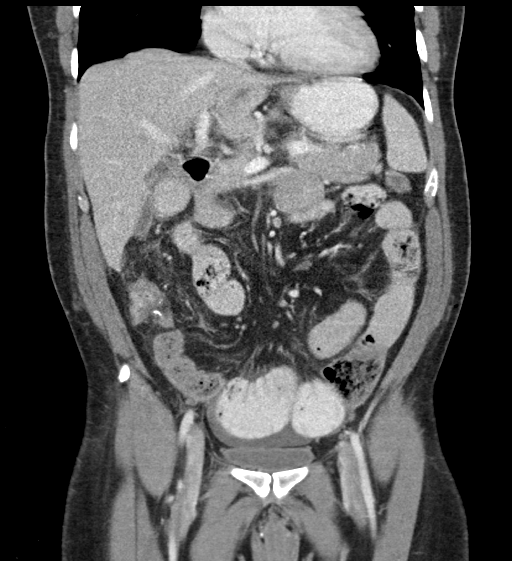
[im 56/101  soft-tissue]
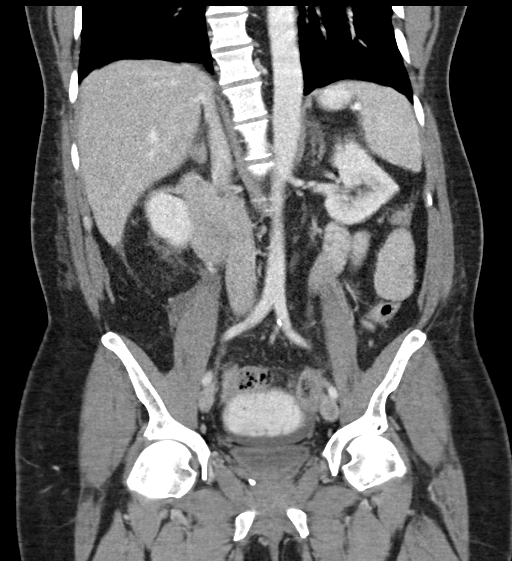

[15 of 46 positions shown; findings below may reference images not displayed]

FINDINGS: Lower chest: Unremarkable.

Hepatobiliary: No suspicious focal abnormality within the liver
parenchyma. There is no evidence for gallstones, gallbladder wall
thickening, or pericholecystic fluid. No intrahepatic or
extrahepatic biliary dilation.

Pancreas: No focal mass lesion. No dilatation of the main duct. No
intraparenchymal cyst. No peripancreatic edema.

Spleen: No splenomegaly. No focal mass lesion.

Adrenals/Urinary Tract: No adrenal nodule or mass. Right kidney
unremarkable. Duplicated left intrarenal collecting system noted. No
evidence for hydroureter. The urinary bladder appears normal for the
degree of distention.

Stomach/Bowel: Stomach is moderately distended with oral contrast
material. Duodenum is normally positioned as is the ligament of
Treitz. Small bowel loops are mildly distended up to 3.5 cm diameter
in the pelvis. Multiple small bowel adhesions are noted in the right
abdomen. Fecalization of small bowel contents noted in the pelvis
([DATE]) suggesting decreased small-bowel transit. Although a discrete
abrupt transition zone cannot be identified, distal ileal loops are
decompressed in the neo terminal ileum tracking into the ileocolic
anastomosis is decompressed. Colon is diffusely decompressed.

Vascular/Lymphatic: There is abdominal aortic atherosclerosis
without aneurysm. There is no gastrohepatic or hepatoduodenal
ligament lymphadenopathy. No intraperitoneal or retroperitoneal
lymphadenopathy. No pelvic sidewall lymphadenopathy.

Reproductive: The prostate gland and seminal vesicles are
unremarkable.

Other: Small volume intraperitoneal free fluid noted in the pelvis.
There is some fluid in the right colectomy bed including a 2.9 x
x 2.9 cm collection anterior to the right kidney on axial 35/3. This
collection contains a tiny gas bubble visible on 34/3. Another
contain fluid collection is seen anterior to the right psoas muscle
on axial 51/3, measuring 2.7 x 1.2 x 3.9 cm. Neither collection has
a well organized or enhancing rim at this time.

Musculoskeletal: Gas is identified in the midline wound, deep to the
staple line. There is some gas in the subcutaneous fat of the
lateral left abdominal wall, presumably injection sites. No
worrisome lytic or sclerotic osseous abnormality.
IMPRESSION: 1. Mid small bowel loops are distended up to 3.5 cm diameter with
some fecalization of small bowel contents in the pelvis suggesting
decreased small-bowel transit. No small bowel wall thickening.
Although no abrupt transition zone is evident, the distal ileal
loops are decompressed and the colon is diffusely decompressed.
Imaging features may reflect ileus although given decompression of
distal small bowel and colon, evolving small bowel obstruction is a
concern.
2. Small volume free fluid in the pelvis with 2 somewhat loculated
fluid collections in the right abdomen, neither of which shows a
well organized or enhancing rim to suggest overt abscess. The more
cranial of the 2 collections has a tiny associated gas bubble.
Residual free gas from surgery is not expected more than about 7-10
days from surgery which would put this at the outer margin of normal
postsurgical change. Given that only a single very tiny gas bubble
is evident, this is probably residual from surgery.
3. Gas is identified in the midline surgical wound deep to the
staple line. Gas in the subcutaneous fat of the lateral left
abdominal wall may be related to injection sites.
4.  Aortic Atherosclerois ([CE]-170.0)

## 2018-11-30 MED ORDER — BISACODYL 10 MG RE SUPP
10.0000 mg | Freq: Once | RECTAL | Status: AC
  Administered 2018-11-30: 10 mg via RECTAL
  Filled 2018-11-30: qty 1

## 2018-11-30 MED ORDER — IOHEXOL 300 MG/ML  SOLN
100.0000 mL | Freq: Once | INTRAMUSCULAR | Status: AC | PRN
  Administered 2018-11-30: 100 mL via INTRAVENOUS

## 2018-11-30 MED ORDER — METOCLOPRAMIDE HCL 5 MG/ML IJ SOLN
10.0000 mg | Freq: Three times a day (TID) | INTRAMUSCULAR | Status: AC
  Administered 2018-11-30 – 2018-12-01 (×3): 10 mg via INTRAVENOUS
  Filled 2018-11-30 (×3): qty 2

## 2018-11-30 NOTE — Progress Notes (Signed)
[facility] Progress Note  11 Days Post-Op  Subjective: CC: vomiting Patient still vomiting overnight and having diarrhea. Really adamant that he does not want NGT reinserted at this time. We discussed risks and he agrees that if he continues to vomit then he will have NGT placed but we will hold off for now. Not having a lot of pain. Wife at bedside this AM. ASL interpreter used.   Objective: Vital signs in last 24 hours: Temp:  [98 F (36.7 C)-98.8 F (37.1 C)] 98.1 F (36.7 C) (11/17 0625) Pulse Rate:  [89-103] 94 (11/17 0625) Resp:  [16-18] 18 (11/17 0625) BP: (117-128)/(78-96) 117/81 (11/17 0625) SpO2:  [98 %-100 %] 99 % (11/17 0625) Weight:  [75 kg] 75 kg (11/17 0625) Last BM Date: 11/27/18  Intake/Output from previous day: 11/16 0701 - 11/17 0700 In: 752.3 [P.O.:120; I.V.:632.3] Out: 600 [Emesis/NG output:600] Intake/Output this shift: No intake/output data recorded.  PE: Gen:  Alert, NAD, pleasant Card:  Regular rate and rhythm, pedal pulses 2+ BL Pulm:  Normal effort, clear to auscultation bilaterally Abd: Soft, mildly ttp along incision, mildly distended, +BS, incision with more SS drainage at open site this AM Skin: warm and dry, no rashes  Psych: A&Ox3   Lab Results:  Recent Labs    11/28/18 0219 11/30/18 0512  WBC 8.1 5.6  HGB 10.5* 10.1*  HCT 32.8* 31.9*  PLT 379 399   BMET Recent Labs    11/28/18 0219 11/30/18 0512  NA 140 140  K 3.7 3.8  CL 106 105  CO2 23 22  GLUCOSE 101* 95  BUN 8 5*  CREATININE 0.89 0.91  CALCIUM 8.6* 8.7*   PT/INR No results for input(s): LABPROT, INR in the last 72 hours. CMP     Component Value Date/Time   NA 140 11/30/2018 0512   NA 141 10/11/2018 1115   K 3.8 11/30/2018 0512   CL 105 11/30/2018 0512   CO2 22 11/30/2018 0512   GLUCOSE 95 11/30/2018 0512   BUN 5 (L) 11/30/2018 0512   BUN 9 10/11/2018 1115   CREATININE 0.91 11/30/2018 0512   CALCIUM 8.7 (L) 11/30/2018 0512   PROT 6.1 (L)  11/28/2018 0219   PROT 7.1 10/11/2018 1115   ALBUMIN 2.8 (L) 11/28/2018 0219   ALBUMIN 4.4 10/11/2018 1115   AST 25 11/28/2018 0219   ALT 32 11/28/2018 0219   ALKPHOS 68 11/28/2018 0219   BILITOT 0.5 11/28/2018 0219   BILITOT 0.2 10/11/2018 1115   GFRNONAA >60 11/30/2018 0512   GFRAA >60 11/30/2018 0512   Lipase     Component Value Date/Time   LIPASE 34 11/18/2018 0430       Studies/Results: Dg Abd Portable 1v  Result Date: 11/30/2018 CLINICAL DATA:  Ileus EXAM: PORTABLE ABDOMEN - 1 VIEW COMPARISON:  November 29, 2018 FINDINGS: Skin staples noted. There remains mild generalized dilatation of small bowel. No air-fluid levels. Air is noted in the rectum. No free air. Lung bases are clear. IMPRESSION: Persistent mild small bowel dilatation, likely postoperative ileus. Air seen in rectum. No frank bowel obstruction. No free air evident. Lung bases clear. Electronically Signed   By: Lowella Grip III M.D.   On: 11/30/2018 08:09   Dg Abd Portable 1v  Result Date: 11/29/2018 CLINICAL DATA:  Postoperative ileus. EXAM: PORTABLE ABDOMEN - 1 VIEW COMPARISON:  11/27/2018 FINDINGS: There is persistent mild dilatation of multiple gas-filled loops of small bowel predominantly in the left mid abdomen, similar to the prior study.  There is no significant colonic dilatation. Gas is present in nondilated stomach. Abdominal skin staples are noted. IMPRESSION: Unchanged mild small bowel dilatation. Electronically Signed   By: Logan Bores M.D.   On: 11/29/2018 09:32    Anti-infectives: Anti-infectives (From admission, onward)   Start     Dose/Rate Route Frequency Ordered Stop   11/19/18 1223  clindamycin (CLEOCIN) IVPB 900 mg     900 mg 100 mL/hr over 30 Minutes Intravenous 60 min pre-op 11/19/18 1223 11/19/18 1430   11/19/18 1223  gentamicin (GARAMYCIN) 390 mg in dextrose 5 % 100 mL IVPB     5 mg/kg  78.8 kg 109.8 mL/hr over 60 Minutes Intravenous 60 min pre-op 11/19/18 1223 11/19/18 1500        Assessment/Plan HTN  HLD Asthma-TRH tapering prednisone Thyroid disease Deaf  Right colon mass, colonic obstruction S/pRight colectomy11/6 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  Plan:CT abd/pelvis today. If patient vomits more will need NGT placed - ok to give ativan for this  LOS: 11 days    Brigid Re , [facility] 11/30/2018, 8:43 AM Please see Amion for pager number during day hours 7:00am-4:30pm

## 2018-11-30 NOTE — Progress Notes (Signed)
PROGRESS NOTE    Alexander Reeves  [id number] DOB: 1965/10/13 DOA: 11/18/2018 PCP: [name], NP    Brief Narrative:  Alexander Reeves Sr.is a 53 year old malepresenting withwith 1 day history of right lower quadrant abdominal pain,,and noted to have and noted to haveright colonic neoplasm right colonic neoplasm.Patient underwent right colectomy for right colon mass on 11/19/2018.    Consultants:   GI, general surgery  Procedures: s/p right colectomy 11/16  Antimicrobials:   Was on clindamycin and gentamicin   Subjective: Had BM, flatus. Less nauseous. Last night with emesis. No other new complaints.  Objective: Vitals:   11/29/18 1158 11/29/18 1756 11/29/18 2333 11/30/18 0625  BP: 122/78 (!) 117/96 128/88 117/81  Pulse: 89 (!) 103 90 94  Resp: 16 18 18 18   Temp: 98.8 F (37.1 C) 98 F (36.7 C) 98.4 F (36.9 C) 98.1 F (36.7 C)  TempSrc: Oral Oral Oral Oral  SpO2: 100% 98% 99% 99%  Weight:    75 kg  Height:        Intake/Output Summary (Last 24 hours) at 11/30/2018 0748 Last data filed at 11/30/2018 [id number] Gross per 24 hour  Intake 752.34 ml  Output 600 ml  Net 152.34 ml   Filed Weights   11/28/18 0606 11/29/18 0131 11/30/18 0625  Weight: 78 kg 75.5 kg 75 kg    Examination: General exam: Appears calm and comfortable , NAD. Family at bedside Respiratory system: CTa, no r/r/w Cardiovascular system: S1 & S2 heard, RRR. no murmurs, or gallops. Gastrointestinal system: Abdomen  distended, midline incision/dressing in place. Decrease /hypoactive bs. Mild ttp diffusely Central nervous system: Alert and oriented x3. No focal neurological deficits. Extremities: no edema b/l Skin: Warm dry Psychiatry:Mood & affect appropriate.    Data Reviewed: I have personally reviewed following labs and imaging studies  CBC: Recent Labs  Lab 11/25/18 0633 11/26/18 0545 11/27/18 0536 11/28/18 0219 11/30/18 0512  WBC 8.9 7.8 8.0 8.1 5.6   HGB 9.8* 10.3* 10.5* 10.5* 10.1*  HCT 32.2* 33.4* 33.0* 32.8* 31.9*  MCV 85.2 85.6 82.3 82.8 81.8  PLT 250 241 337 379 [id number]   Basic Metabolic Panel: Recent Labs  Lab 11/24/18 0506 11/25/18 0633 11/26/18 0545 11/27/18 0536 11/28/18 0219 11/30/18 0512  NA 140 139 137 138 140 140  K 3.8 4.1 4.1 4.0 3.7 3.8  CL 106 106 105 105 106 105  CO2 21* 20* 17* 19* 23 22  GLUCOSE 74 83 86 101* 101* 95  BUN 7 6 <5* 6 8 5*  CREATININE 1.09 1.14 0.80 1.07 0.89 0.91  CALCIUM 8.2* 8.3* 8.4* 8.8* 8.6* 8.7*  MG 2.0 2.0 1.9  --   --   --    GFR: Estimated Creatinine Clearance: 99.2 mL/min (by C-G formula based on SCr of 0.91 mg/dL). Liver Function Tests: Recent Labs  Lab 11/28/18 0219  AST 25  ALT 32  ALKPHOS 68  BILITOT 0.5  PROT 6.1*  ALBUMIN 2.8*   No results for input(s): LIPASE, AMYLASE in the last 168 hours. No results for input(s): AMMONIA in the last 168 hours. Coagulation Profile: No results for input(s): INR, PROTIME in the last 168 hours. Cardiac Enzymes: No results for input(s): CKTOTAL, CKMB, CKMBINDEX, TROPONINI in the last 168 hours. BNP (last 3 results) No results for input(s): PROBNP in the last 8760 hours. HbA1C: No results for input(s): HGBA1C in the last 72 hours. CBG: No results for input(s): GLUCAP in the last 168 hours. Lipid Profile: No  results for input(s): CHOL, HDL, LDLCALC, TRIG, CHOLHDL, LDLDIRECT in the last 72 hours. Thyroid Function Tests: No results for input(s): TSH, T4TOTAL, FREET4, T3FREE, THYROIDAB in the last 72 hours. Anemia Panel: No results for input(s): VITAMINB12, FOLATE, FERRITIN, TIBC, IRON, RETICCTPCT in the last 72 hours. Sepsis Labs: No results for input(s): PROCALCITON, LATICACIDVEN in the last 168 hours.  No results found for this or any previous visit (from the past 240 hour(s)).       Radiology Studies: Dg Abd Portable 1v  Result Date: 11/29/2018 CLINICAL DATA:  Postoperative ileus. EXAM: PORTABLE ABDOMEN - 1 VIEW  COMPARISON:  11/27/2018 FINDINGS: There is persistent mild dilatation of multiple gas-filled loops of small bowel predominantly in the left mid abdomen, similar to the prior study. There is no significant colonic dilatation. Gas is present in nondilated stomach. Abdominal skin staples are noted. IMPRESSION: Unchanged mild small bowel dilatation. Electronically Signed   By: Logan Bores M.D.   On: 11/29/2018 09:32        Scheduled Meds: . acetaminophen  650 mg Oral Q6H  . chlorhexidine  15 mL Mouth Rinse BID  . Chlorhexidine Gluconate Cloth  6 each Topical Daily  . enoxaparin (LOVENOX) injection  40 mg Subcutaneous Q24H  . feeding supplement  1 Container Oral TID BM  . fluticasone furoate-vilanterol  1 puff Inhalation Daily  . loratadine  10 mg Oral Daily  . mouth rinse  15 mL Mouth Rinse q12n4p  . methocarbamol  500 mg Oral TID  . metoCLOPramide (REGLAN) injection  5 mg Intravenous Q6H  . montelukast  10 mg Oral QHS  . pantoprazole  40 mg Oral Daily  . sodium chloride flush  3 mL Intravenous Once   Continuous Infusions: . 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 Lowella Dandy #[id number] - colonoscopy 11/19/2018 by [name] -Surgical pathologywith noted invasive adenocarcinoma. -Oncology plans to see as outpatient -GSX following, will defer further mx to [place]....> today was offered NGT but pt refused. He is agreeable if continues to vomit will get the NGT placed.   Postop ileus-persistent bowel rest-advance diet as tolerated  Supportive anti nauseants and antiemetics  Monitor renal function with Fluid and electrolytes status GSX mx..>see above Still vomiting.    Asthma  -stable, no exacerbation   DVT prophylaxis:Lovenox Code Status:Full code Family Communication:Family at bedside updated while discussing with pt. Disposition Plan:likely 1-2 more days, when stable from surgical stand point.     LOS: 11 days   Time spent: 45 minutes with more than 50% on [place], MD [facility] Pager [phone]  If 7PM-7AM, please contact night-coverage [email or web address] Password [facility] 11/30/2018, 7:48 AM

## 2018-12-01 ENCOUNTER — Inpatient Hospital Stay (HOSPITAL_COMMUNITY): Payer: Medicare Other

## 2018-12-01 LAB — SURGICAL PATHOLOGY

## 2018-12-01 IMAGING — DX DG ABD PORTABLE 1V
1 series · 1 of 1 positions shown · non-contrast
Comparison: Portable exam [OB] hours compared to [DATE]

CLINICAL DATA: Abdominal pain, ileus

EXAM:
PORTABLE ABDOMEN - 1 VIEW

[abdomen]
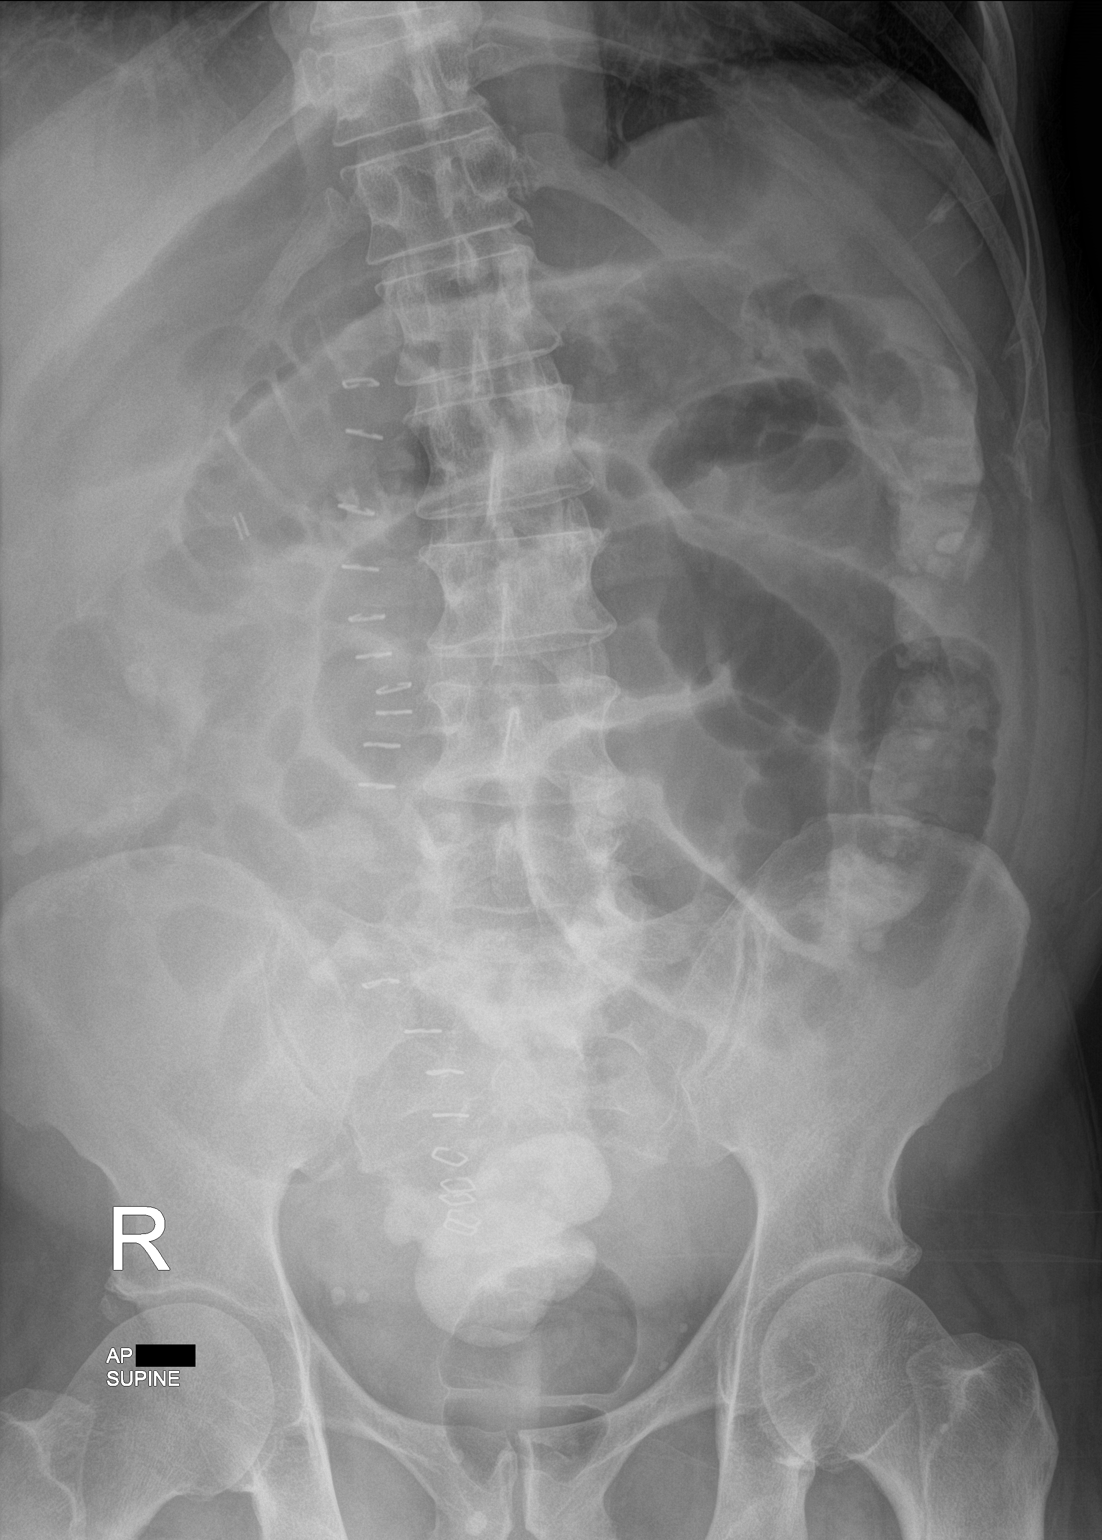

[1 of 1 positions shown; findings below may reference images not displayed]

FINDINGS: Skin clips from prior laparotomy.

Small amount retained contrast in colon.

Dilated small bowel loops in mid abdomen favor postoperative ileus,
little changed.

Small pelvic phleboliths.

Bones unremarkable.
IMPRESSION: Persistent dilatation of small bowel loops favor postoperative
ileus.

## 2018-12-01 MED ORDER — BISACODYL 10 MG RE SUPP
10.0000 mg | Freq: Once | RECTAL | Status: AC
  Administered 2018-12-01: 10 mg via RECTAL
  Filled 2018-12-01: qty 1

## 2018-12-01 NOTE — Progress Notes (Signed)
Hospitalist progress note  If 7PM-7AM,  night-coverage-look on AMION -prefer pages-not epic chat,please  Alexander Reeves [id number] DOB: May 03, 1965 DOA: 11/18/2018  PCP: [name], NP   Narrative:  53 year old black male Prior history of asthma and ICU hospitalization for the same 08/2016 Congenital deafness Thyroid disease Reflux  Admit 11/18/2018 nausea vomiting diarrhea CT abdomen showed mid to mid distal ascending colon wall thickening concerning for neoplasm right colon GI and general surgery consulted Underwent right colectomy 11/19/2018 Hospitalization complicated by ileus  Assessment & Plan: 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 Ileus complicating issue seems to have resolved Monitor trends and defer further management/diet to general surgery IV saline with 20 of K at 50 cc an hour Thyroid disease Not currently on meds at this time Prior asthma Monitor lovenenox d/w sign other, inpatient pending resoltuion--liekly home 24-48 h  Subjective:  Some n this am no vomit Stool x1 Has q's about meds no cp  Consultants:   General surgery Procedures:   Colectomy 11/6 Antimicrobials:   Gentamicin clindamycin 11/6 Objective: Vitals:   11/30/18 2144 12/01/18 0520 12/01/18 0831 12/01/18 1100  BP: 132/88 134/88  122/87  Pulse: 97 95 89 98  Resp: 18 18 18 16   Temp: 98 F (36.7 C) 98.9 F (37.2 C)  98.7 F (37.1 C)  TempSrc: Oral Oral  Oral  SpO2: 100% 97% 98% 100%  Weight:  75.1 kg    Height:        Intake/Output Summary (Last 24 hours) at 12/01/2018 1359 Last data filed at 11/30/2018 1658 Gross per 24 hour  Intake 273.37 ml  Output -  Net 273.37 ml   Filed Weights   11/29/18 0131 11/30/18 0625 12/01/18 0520  Weight: 75.5 kg 75 kg 75.1 kg    Examination: Awake alert coherent no distress EOMI NCAT moderately built and nourished S1-S2 no murmur rub or gallop no focal deficit Abdomen slightly distended staples in  place No lower extremity edema ROM intact  Data Reviewed: I have personally reviewed following labs and imaging studies Labs overall unchanged X-ray films of abdomen shows dilatation of large bowel   Scheduled Meds: . acetaminophen  650 mg Oral Q6H  . chlorhexidine  15 mL Mouth Rinse BID  . Chlorhexidine Gluconate Cloth  6 each Topical Daily  . enoxaparin (LOVENOX) injection  40 mg Subcutaneous Q24H  . feeding supplement  1 Container Oral TID BM  . fluticasone furoate-vilanterol  1 puff Inhalation Daily  . loratadine  10 mg Oral Daily  . mouth rinse  15 mL Mouth Rinse q12n4p  . methocarbamol  500 mg Oral TID  . montelukast  10 mg Oral QHS  . pantoprazole  40 mg Oral Daily  . sodium chloride flush  3 mL Intravenous Once   Continuous Infusions: . 0.9 % NaCl with KCl 20 mEq / L 50 mL/hr at 12/01/18 [id number]    Radiology Studies: Reviewed images personally in health database    LOS: 12 days   Time spent: [place], MD [facility]  12/01/2018, 1:59 PM

## 2018-12-01 NOTE — Progress Notes (Signed)
[facility] Progress Note  12 Days Post-Op  Subjective: CC: ileus Patient reports he had some good bowel function after suppository yesterday and was able to keep dinner down. Feels a little nauseated this AM but has not vomited. Discussed CT finding with patient and importance of mobilization.   Objective: Vital signs in last 24 hours: Temp:  [98 F (36.7 C)-98.9 F (37.2 C)] 98.9 F (37.2 C) (11/18 0520) Pulse Rate:  [89-97] 89 (11/18 0831) Resp:  [17-19] 18 (11/18 0831) BP: (120-134)/(79-88) 134/88 (11/18 0520) SpO2:  [97 %-100 %] 98 % (11/18 0831) Weight:  [75.1 kg] 75.1 kg (11/18 0520) Last BM Date: 11/30/18  Intake/Output from previous day: 11/17 0701 - 11/18 0700 In: 370.1 [I.V.:370.1] Out: -  Intake/Output this shift: No intake/output data recorded.  PE: Gen: Alert, NAD, pleasant Card: Regular rate and rhythm, pedal pulses 2+ BL Pulm: Normal effort, clear to auscultation bilaterally Abd: Soft,mildly ttp along incision,mildlydistended,+BS, incision with minimal purulence at open site this AM Skin: warm and dry, no rashes  Psych: A&Ox3   Lab Results:  Recent Labs    11/30/18 0512  WBC 5.6  HGB 10.1*  HCT 31.9*  PLT 399   BMET Recent Labs    11/30/18 0512  NA 140  K 3.8  CL 105  CO2 22  GLUCOSE 95  BUN 5*  CREATININE 0.91  CALCIUM 8.7*   PT/INR No results for input(s): LABPROT, INR in the last 72 hours. CMP     Component Value Date/Time   NA 140 11/30/2018 0512   NA 141 10/11/2018 1115   K 3.8 11/30/2018 0512   CL 105 11/30/2018 0512   CO2 22 11/30/2018 0512   GLUCOSE 95 11/30/2018 0512   BUN 5 (L) 11/30/2018 0512   BUN 9 10/11/2018 1115   CREATININE 0.91 11/30/2018 0512   CALCIUM 8.7 (L) 11/30/2018 0512   PROT 6.1 (L) 11/28/2018 0219   PROT 7.1 10/11/2018 1115   ALBUMIN 2.8 (L) 11/28/2018 0219   ALBUMIN 4.4 10/11/2018 1115   AST 25 11/28/2018 0219   ALT 32 11/28/2018 0219   ALKPHOS 68 11/28/2018 0219   BILITOT  0.5 11/28/2018 0219   BILITOT 0.2 10/11/2018 1115   GFRNONAA >60 11/30/2018 0512   GFRAA >60 11/30/2018 0512   Lipase     Component Value Date/Time   LIPASE 34 11/18/2018 0430       Studies/Results: Ct Abdomen Pelvis W Contrast  Result Date: 11/30/2018 CLINICAL DATA:  Status post right colectomy on 11/19/2022 colorectal cancer. Nausea and vomiting. EXAM: CT ABDOMEN AND PELVIS WITH CONTRAST TECHNIQUE: Multidetector CT imaging of the abdomen and pelvis was performed using the standard protocol following bolus administration of intravenous contrast. CONTRAST:  133mL OMNIPAQUE IOHEXOL 300 MG/ML  SOLN COMPARISON:  11/18/2018 FINDINGS: Lower chest: Unremarkable. Hepatobiliary: No suspicious focal abnormality within the liver parenchyma. There is no evidence for gallstones, gallbladder wall thickening, or pericholecystic fluid. No intrahepatic or extrahepatic biliary dilation. Pancreas: No focal mass lesion. No dilatation of the main duct. No intraparenchymal cyst. No peripancreatic edema. Spleen: No splenomegaly. No focal mass lesion. Adrenals/Urinary Tract: No adrenal nodule or mass. Right kidney unremarkable. Duplicated left intrarenal collecting system noted. No evidence for hydroureter. The urinary bladder appears normal for the degree of distention. Stomach/Bowel: Stomach is moderately distended with oral contrast material. Duodenum is normally positioned as is the ligament of Treitz. Small bowel loops are mildly distended up to 3.5 cm diameter in the pelvis. Multiple small bowel adhesions  are noted in the right abdomen. Fecalization of small bowel contents noted in the pelvis (61/3) suggesting decreased small-bowel transit. Although a discrete abrupt transition zone cannot be identified, distal ileal loops are decompressed in the neo terminal ileum tracking into the ileocolic anastomosis is decompressed. Colon is diffusely decompressed. Vascular/Lymphatic: There is abdominal aortic atherosclerosis  without aneurysm. There is no gastrohepatic or hepatoduodenal ligament lymphadenopathy. No intraperitoneal or retroperitoneal lymphadenopathy. No pelvic sidewall lymphadenopathy. Reproductive: The prostate gland and seminal vesicles are unremarkable. Other: Small volume intraperitoneal free fluid noted in the pelvis. There is some fluid in the right colectomy bed including a 2.9 x 1.3 x 2.9 cm collection anterior to the right kidney on axial 35/3. This collection contains a tiny gas bubble visible on 34/3. Another contain fluid collection is seen anterior to the right psoas muscle on axial 51/3, measuring 2.7 x 1.2 x 3.9 cm. Neither collection has a well organized or enhancing rim at this time. Musculoskeletal: Gas is identified in the midline wound, deep to the staple line. There is some gas in the subcutaneous fat of the lateral left abdominal wall, presumably injection sites. No worrisome lytic or sclerotic osseous abnormality. IMPRESSION: 1. Mid small bowel loops are distended up to 3.5 cm diameter with some fecalization of small bowel contents in the pelvis suggesting decreased small-bowel transit. No small bowel wall thickening. Although no abrupt transition zone is evident, the distal ileal loops are decompressed and the colon is diffusely decompressed. Imaging features may reflect ileus although given decompression of distal small bowel and colon, evolving small bowel obstruction is a concern. 2. Small volume free fluid in the pelvis with 2 somewhat loculated fluid collections in the right abdomen, neither of which shows a well organized or enhancing rim to suggest overt abscess. The more cranial of the 2 collections has a tiny associated gas bubble. Residual free gas from surgery is not expected more than about 7-10 days from surgery which would put this at the outer margin of normal postsurgical change. Given that only a single very tiny gas bubble is evident, this is probably residual from surgery. 3. Gas  is identified in the midline surgical wound deep to the staple line. Gas in the subcutaneous fat of the lateral left abdominal wall may be related to injection sites. 4.  Aortic Atherosclerois (ICD10-170.0) Electronically Signed   By: Misty Stanley M.D.   On: 11/30/2018 15:22   Dg Abd Portable 1v  Result Date: 12/01/2018 CLINICAL DATA:  Abdominal pain, ileus EXAM: PORTABLE ABDOMEN - 1 VIEW COMPARISON:  Portable exam 0654 hours compared to 11/30/2018 FINDINGS: Skin clips from prior laparotomy. Small amount retained contrast in colon. Dilated small bowel loops in mid abdomen favor postoperative ileus, little changed. Small pelvic phleboliths. Bones unremarkable. IMPRESSION: Persistent dilatation of small bowel loops favor postoperative ileus. Electronically Signed   By: Lavonia Dana M.D.   On: 12/01/2018 08:48   Dg Abd Portable 1v  Result Date: 11/30/2018 CLINICAL DATA:  Ileus EXAM: PORTABLE ABDOMEN - 1 VIEW COMPARISON:  November 29, 2018 FINDINGS: Skin staples noted. There remains mild generalized dilatation of small bowel. No air-fluid levels. Air is noted in the rectum. No free air. Lung bases are clear. IMPRESSION: Persistent mild small bowel dilatation, likely postoperative ileus. Air seen in rectum. No frank bowel obstruction. No free air evident. Lung bases clear. Electronically Signed   By: Lowella Grip III M.D.   On: 11/30/2018 08:09    Anti-infectives: Anti-infectives (From admission, onward)  Start     Dose/Rate Route Frequency Ordered Stop   11/19/18 1223  clindamycin (CLEOCIN) IVPB 900 mg     900 mg 100 mL/hr over 30 Minutes Intravenous 60 min pre-op 11/19/18 1223 11/19/18 1430   11/19/18 1223  gentamicin (GARAMYCIN) 390 mg in dextrose 5 % 100 mL IVPB     5 mg/kg  78.8 kg 109.8 mL/hr over 60 Minutes Intravenous 60 min pre-op 11/19/18 1223 11/19/18 1500       Assessment/Plan HTN  HLD Asthma-TRH tapering prednisone Thyroid disease Deaf  Right colon mass, colonic  obstruction S/pRight colectomy11/6 [name] - POD#12 - surgical path:Invasive adenocarcinomawith 2/14 positive lymph nodes >>Oncology plans to see outpatient -BID dressing changes to midline wound, removed some staples for wound infection yesterday - CT yesterday just with post-op ileus - continue to mobilize, repeat suppository today   ID -abx periop FEN -continue soft diet as tolerated VTE -SCDs, lovenox Foley -d/c 11/7 Follow up -[name]  Plan:Continue current care, await improvement in abdominal distention and n/v  LOS: 12 days    Brigid Re , [facility] 12/01/2018, 9:27 AM Please see Amion for pager number during day hours 7:00am-4:30pm

## 2018-12-02 LAB — CBC WITH DIFFERENTIAL/PLATELET
Abs Immature Granulocytes: 0.04 10*3/uL (ref 0.00–0.07)
Basophils Absolute: 0 10*3/uL (ref 0.0–0.1)
Basophils Relative: 0 %
Eosinophils Absolute: 0.2 10*3/uL (ref 0.0–0.5)
Eosinophils Relative: 3 %
HCT: 30.2 % — ABNORMAL LOW (ref 39.0–52.0)
Hemoglobin: 9.5 g/dL — ABNORMAL LOW (ref 13.0–17.0)
Immature Granulocytes: 1 %
Lymphocytes Relative: 24 %
Lymphs Abs: 1.4 10*3/uL (ref 0.7–4.0)
MCH: 26 pg (ref 26.0–34.0)
MCHC: 31.5 g/dL (ref 30.0–36.0)
MCV: 82.5 fL (ref 80.0–100.0)
Monocytes Absolute: 0.6 10*3/uL (ref 0.1–1.0)
Monocytes Relative: 11 %
Neutro Abs: 3.6 10*3/uL (ref 1.7–7.7)
Neutrophils Relative %: 61 %
Platelets: 412 10*3/uL — ABNORMAL HIGH (ref 150–400)
RBC: 3.66 MIL/uL — ABNORMAL LOW (ref 4.22–5.81)
RDW: 14.6 % (ref 11.5–15.5)
WBC: 5.9 10*3/uL (ref 4.0–10.5)
nRBC: 0 % (ref 0.0–0.2)

## 2018-12-02 LAB — BASIC METABOLIC PANEL
Anion gap: 9 (ref 5–15)
BUN: 6 mg/dL (ref 6–20)
CO2: 24 mmol/L (ref 22–32)
Calcium: 8.3 mg/dL — ABNORMAL LOW (ref 8.9–10.3)
Chloride: 106 mmol/L (ref 98–111)
Creatinine, Ser: 1.06 mg/dL (ref 0.61–1.24)
GFR calc Af Amer: >60 mL/min (ref >60)
GFR calc non Af Amer: >60 mL/min (ref >60)
Glucose, Bld: 90 mg/dL (ref 70–99)
Potassium: 4.4 mmol/L (ref 3.5–5.1)
Sodium: 139 mmol/L (ref 135–145)

## 2018-12-02 MED ORDER — PANTOPRAZOLE SODIUM 40 MG PO TBEC: 40.0000 mg | DELAYED_RELEASE_TABLET | Freq: Every day | ORAL | 3 refills | Status: DC

## 2018-12-02 MED FILL — PANTOPRAZOLE SOD DR 40 MG T: 40 | 30 days supply | Qty: 30 | Fill #0

## 2018-12-02 NOTE — Progress Notes (Signed)
RN gave pt discharge instructions with ASL interpretation help from [place]. RN taught pt wife how to do dressing changes daily. Went over discharge packet, [facility]

## 2018-12-02 NOTE — Discharge Summary (Signed)
Physician Discharge Summary  Alexander PLOUGH Sr. [id number] DOB: 04/03/1965 DOA: 11/18/2018  PCP: [name], NP  Admit date: 11/18/2018 Discharge date: 12/02/2018  Time spent: 30 minutes  Recommendations for Outpatient Follow-up:  1. Dressing changes as well as wound care postoperatively and staple removal as per general surgery 2. Needs screening labs in about 1 week CBC and Chem-7 3. Staples wound care as above 4. Needs outpatient coordination of care and follow-up with oncology  Discharge Diagnoses:  Active Problems:   Chronic asthma   Abdominal pain   Abnormal CT scan, colon   Malignant neoplasm of ascending colon (HCC)   Colon cancer (HCC)   Ileus, postoperative [facility])   Discharge Condition: Improved  Diet recommendation: Soft diet initially at least for the next week  Filed Weights   11/30/18 0625 12/01/18 0520 12/02/18 0616  Weight: 75 kg 75.1 kg 74.4 kg    History of present illness:  53 year old black male Prior history of asthma and ICU hospitalization for the same 08/2016 Congenital deafness Thyroid disease Reflux  Admit 11/18/2018 nausea vomiting diarrhea CT abdomen showed mid to mid distal ascending colon wall thickening concerning for neoplasm right colon GI and general surgery consulted Underwent right colectomy 11/19/2018 Hospitalization complicated by ileus  Hospital Course:  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 Ileus complicating issue seems to have resolved he is tolerating a full diet Monitor trends and defer further management/diet to general surgery IV saline with 20 of K at 50 cc an hour was discontinued on discharge will need outpatient screening labs Thyroid disease Not currently on meds at this time Prior asthma Monitor  Procedures:  Laparotomy 11/19/2018 which right hemicolectomy Consultations:  General surgery  Discharge Exam: Vitals:   12/01/18 2231 12/02/18 0616  BP: 135/88 117/87   Pulse: 89 79  Resp: 18 18  Temp: 98.5 F (36.9 C) 98.3 F (36.8 C)  SpO2: 99% 99%    General: Awake alert coherent Cardiovascular: S1-S2 no murmur rub or gallop Respiratory: Chest clinically clear no added sound no rales no rhonchi no wheeze Abdomen soft staples midline small 3 x 3 cm irregular area of dehiscence of staple wounds Neurologically intact no focal deficit  Discharge Instructions   Discharge Instructions    Diet - low sodium heart healthy   Complete by: As directed    Discharge instructions   Complete by: As directed    You can take the majority of the medications without change we have added Protonix If you have further symptoms or issues please come to the hospital let somebody know. You should do dressing changes as per your prior instructions by general surgery and staples should will come out when they decide to see you in the office for follow-up   Increase activity slowly   Complete by: As directed      Allergies as of 12/02/2018      Reactions   Cheese Shortness Of Breath, Swelling   Swelling of the throat   Eggs Or Egg-derived Products Shortness Of Breath, Swelling   Swelling of the throat   Penicillins Anaphylaxis   Has patient had a PCN reaction causing immediate rash, facial/tongue/throat swelling, SOB or lightheadedness with hypotension: Yes Has patient had a PCN reaction causing severe rash involving mucus membranes or skin necrosis: Unknown Has patient had a PCN reaction that required hospitalization: Unknown Has patient had a PCN reaction occurring within the last 10 years: Unknown If all of the above answers are "  NO", then may proceed with Cephalosporin use.      Medication List    STOP taking these medications   predniSONE 50 MG tablet Commonly known as: DELTASONE     TAKE these medications   albuterol (2.5 MG/3ML) 0.083% nebulizer solution Commonly known as: PROVENTIL Take 3 mLs (2.5 mg total) by nebulization every 6 (six) hours as  needed for wheezing or shortness of breath.   albuterol 108 (90 Base) MCG/ACT inhaler Commonly known as: VENTOLIN HFA Inhale 2 puffs into the lungs every 4 (four) hours as needed for wheezing or shortness of breath.   Breo Ellipta 200-25 MCG/INH Aepb Generic drug: fluticasone furoate-vilanterol Inhale 1 puff into the lungs daily.   fenofibrate 145 MG tablet Commonly known as: Tricor Take 1 tablet (145 mg total) by mouth daily.   loratadine 10 MG tablet Commonly known as: CLARITIN Take 1 tablet (10 mg total) by mouth daily.   montelukast 10 MG tablet Commonly known as: SINGULAIR Take 1 tablet (10 mg total) by mouth at bedtime.   pantoprazole 40 MG tablet Commonly known as: PROTONIX Take 1 tablet (40 mg total) by mouth daily. Start taking on: December 03, 2018      Allergies  Allergen Reactions  . Cheese Shortness Of Breath and Swelling    Swelling of the throat  . Eggs Or Egg-Derived Products Shortness Of Breath and Swelling    Swelling of the throat  . Penicillins Anaphylaxis    Has patient had a PCN reaction causing immediate rash, facial/tongue/throat swelling, SOB or lightheadedness with hypotension: Yes Has patient had a PCN reaction causing severe rash involving mucus membranes or skin necrosis: Unknown Has patient had a PCN reaction that required hospitalization: Unknown Has patient had a PCN reaction occurring within the last 10 years: Unknown If all of the above answers are "NO", then may proceed with Cephalosporin use.    Follow-up Information    Rolm Bookbinder, MD. Go on 12/07/2018.   Specialty: General Surgery Why: Follow up appointment scheduled for 11:30 AM. Please arrive 30 min prior to appointment time. Bring photo ID and insurance information.  Contact information: [address] [place] [address] [phone]            The results of significant diagnostics from this hospitalization (including imaging, microbiology,  ancillary and laboratory) are listed below for reference.    Significant Diagnostic Studies: Ct Abdomen Pelvis W Contrast  Result Date: 11/30/2018 CLINICAL DATA:  Status post right colectomy on 11/19/2022 colorectal cancer. Nausea and vomiting. EXAM: CT ABDOMEN AND PELVIS WITH CONTRAST TECHNIQUE: Multidetector CT imaging of the abdomen and pelvis was performed using the standard protocol following bolus administration of intravenous contrast. CONTRAST:  19mL OMNIPAQUE IOHEXOL 300 MG/ML  SOLN COMPARISON:  11/18/2018 FINDINGS: Lower chest: Unremarkable. Hepatobiliary: No suspicious focal abnormality within the liver parenchyma. There is no evidence for gallstones, gallbladder wall thickening, or pericholecystic fluid. No intrahepatic or extrahepatic biliary dilation. Pancreas: No focal mass lesion. No dilatation of the main duct. No intraparenchymal cyst. No peripancreatic edema. Spleen: No splenomegaly. No focal mass lesion. Adrenals/Urinary Tract: No adrenal nodule or mass. Right kidney unremarkable. Duplicated left intrarenal collecting system noted. No evidence for hydroureter. The urinary bladder appears normal for the degree of distention. Stomach/Bowel: Stomach is moderately distended with oral contrast material. Duodenum is normally positioned as is the ligament of Treitz. Small bowel loops are mildly distended up to 3.5 cm diameter in the pelvis. Multiple small bowel adhesions are noted  in the right abdomen. Fecalization of small bowel contents noted in the pelvis (61/3) suggesting decreased small-bowel transit. Although a discrete abrupt transition zone cannot be identified, distal ileal loops are decompressed in the neo terminal ileum tracking into the ileocolic anastomosis is decompressed. Colon is diffusely decompressed. Vascular/Lymphatic: There is abdominal aortic atherosclerosis without aneurysm. There is no gastrohepatic or hepatoduodenal ligament lymphadenopathy. No intraperitoneal or  retroperitoneal lymphadenopathy. No pelvic sidewall lymphadenopathy. Reproductive: The prostate gland and seminal vesicles are unremarkable. Other: Small volume intraperitoneal free fluid noted in the pelvis. There is some fluid in the right colectomy bed including a 2.9 x 1.3 x 2.9 cm collection anterior to the right kidney on axial 35/3. This collection contains a tiny gas bubble visible on 34/3. Another contain fluid collection is seen anterior to the right psoas muscle on axial 51/3, measuring 2.7 x 1.2 x 3.9 cm. Neither collection has a well organized or enhancing rim at this time. Musculoskeletal: Gas is identified in the midline wound, deep to the staple line. There is some gas in the subcutaneous fat of the lateral left abdominal wall, presumably injection sites. No worrisome lytic or sclerotic osseous abnormality. IMPRESSION: 1. Mid small bowel loops are distended up to 3.5 cm diameter with some fecalization of small bowel contents in the pelvis suggesting decreased small-bowel transit. No small bowel wall thickening. Although no abrupt transition zone is evident, the distal ileal loops are decompressed and the colon is diffusely decompressed. Imaging features may reflect ileus although given decompression of distal small bowel and colon, evolving small bowel obstruction is a concern. 2. Small volume free fluid in the pelvis with 2 somewhat loculated fluid collections in the right abdomen, neither of which shows a well organized or enhancing rim to suggest overt abscess. The more cranial of the 2 collections has a tiny associated gas bubble. Residual free gas from surgery is not expected more than about 7-10 days from surgery which would put this at the outer margin of normal postsurgical change. Given that only a single very tiny gas bubble is evident, this is probably residual from surgery. 3. Gas is identified in the midline surgical wound deep to the staple line. Gas in the subcutaneous fat of the  lateral left abdominal wall may be related to injection sites. 4.  Aortic Atherosclerois (ICD10-170.0) Electronically Signed   By: Misty Stanley M.D.   On: 11/30/2018 15:22   Ct Abdomen Pelvis W Contrast  Result Date: 11/18/2018 CLINICAL DATA:  Right-sided abdominal pain with nausea EXAM: CT ABDOMEN AND PELVIS WITH CONTRAST TECHNIQUE: Multidetector CT imaging of the abdomen and pelvis was performed using the standard protocol following bolus administration of intravenous contrast. CONTRAST:  193mL OMNIPAQUE IOHEXOL 300 MG/ML  SOLN COMPARISON:  October 17, 2018 FINDINGS: Lower chest: There is atelectatic change in the left base. The lung bases otherwise are clear. Hepatobiliary: No focal liver lesions are evident on this study. Gallbladder wall is not appreciably thickened. There is no biliary duct dilatation. Pancreas: There is no evident pancreatic mass or inflammatory focus. Spleen: No splenic lesions are appreciable. Adrenals/Urinary Tract: Adrenals bilaterally appear unremarkable. Kidneys bilaterally show no evident mass or hydronephrosis on either side. There is no evident renal or ureteral calculus on either side. Urinary bladder is midline with wall thickness within normal limits. Stomach/Bowel: There is wall thickening in the mid ascending colon region, extending over a distance of slightly greater than 5 cm. Proximal to this area of wall thickening in the ascending colon, there is  mild distension of the cecum and proximal ascending colon with stool. Stool is also noted in the distal ileum. There is no distal ileal inflammation or fistula. There is no frank bowel obstruction. There is air in the colon distal to the area of wall thickening in the ascending colon. There are scattered colonic diverticula without diverticulitis evident. No free air or portal venous air appreciable. Vascular/Lymphatic: No abdominal aortic aneurysm. There are scattered foci of calcification in the aorta and common iliac  arteries. No adenopathy is evident in the abdomen or pelvis. Reproductive: Prostate and seminal vesicles are normal in size and contour. No evident pelvic mass. Other: The appendix appears unremarkable. No periappendiceal region inflammatory changes evident. No abscess or ascites is evident in the abdomen or pelvis. Musculoskeletal: There is degenerative change in the lumbar spine with vacuum phenomenon at L5-S1. No blastic or lytic bone lesions are evident. There is no intramuscular or abdominal wall lesion. IMPRESSION: 1. Wall thickening throughout much of the mid and mid to distal ascending colon. Concern for underlying neoplasm in the right colon. Note that there is mild distention of the proximal ascending colon and cecum, proximal to this area of wall thickening in the mid ascending colon. Stool also is noted in the terminal ileum, likely due to relative obstruction from the apparent lesion in the ascending colon. No small bowel dilatation noted to indicate small bowel obstruction. This abnormality in the ascending colon warrants direct colonic visualization to assess for neoplasm. Note that no appreciable diverticular disease is noted in this portion of the colon. 2.  No abscess in the abdomen or pelvis.  Appendix appears normal. 3. No renal or ureteral calculus. No hydronephrosis. Urinary bladder wall thickness normal. 4.  Aortic Atherosclerosis (ICD10-I70.0). Electronically Signed   By: Lowella Grip III M.D.   On: 11/18/2018 11:36   Kub  Result Date: 11/27/2018 CLINICAL DATA:  Abdominal pain and vomiting EXAM: DG ABDOMEN ACUTE W/ 1V CHEST COMPARISON:  CT 11/18/2018, radiographs 11/22/2018, chest x-ray 10/17/2018, 12/31/2017 FINDINGS: Single-view chest demonstrates linear atelectasis at the lingula and left base. Attenuated lung opacities at the periphery of the left mid to lower lung but without discrete pleural line to suggest in pneumothorax. Stable cardiomediastinal silhouette. Supine and  upright views of the abdomen demonstrate cutaneous staples at the midline. Slight decreased small and large bowel distension since prior radiograph. Calcified phleboliths in the pelvis IMPRESSION: 1. Linear atelectasis or scarring at the lingula and left base. 2. Slight interval decrease in gaseous dilatation of small and large bowel Electronically Signed   By: Donavan Foil M.D.   On: 11/27/2018 17:11   Dg Abd Portable 1v  Result Date: 12/01/2018 CLINICAL DATA:  Abdominal pain, ileus EXAM: PORTABLE ABDOMEN - 1 VIEW COMPARISON:  Portable exam 0654 hours compared to 11/30/2018 FINDINGS: Skin clips from prior laparotomy. Small amount retained contrast in colon. Dilated small bowel loops in mid abdomen favor postoperative ileus, little changed. Small pelvic phleboliths. Bones unremarkable. IMPRESSION: Persistent dilatation of small bowel loops favor postoperative ileus. Electronically Signed   By: Lavonia Dana M.D.   On: 12/01/2018 08:48   Dg Abd Portable 1v  Result Date: 11/30/2018 CLINICAL DATA:  Ileus EXAM: PORTABLE ABDOMEN - 1 VIEW COMPARISON:  November 29, 2018 FINDINGS: Skin staples noted. There remains mild generalized dilatation of small bowel. No air-fluid levels. Air is noted in the rectum. No free air. Lung bases are clear. IMPRESSION: Persistent mild small bowel dilatation, likely postoperative ileus. Air seen in rectum. No  frank bowel obstruction. No free air evident. Lung bases clear. Electronically Signed   By: Lowella Grip III M.D.   On: 11/30/2018 08:09   Dg Abd Portable 1v  Result Date: 11/29/2018 CLINICAL DATA:  Postoperative ileus. EXAM: PORTABLE ABDOMEN - 1 VIEW COMPARISON:  11/27/2018 FINDINGS: There is persistent mild dilatation of multiple gas-filled loops of small bowel predominantly in the left mid abdomen, similar to the prior study. There is no significant colonic dilatation. Gas is present in nondilated stomach. Abdominal skin staples are noted. IMPRESSION: Unchanged mild  small bowel dilatation. Electronically Signed   By: Logan Bores M.D.   On: 11/29/2018 09:32   Dg Abd Portable 1 View  Result Date: 11/22/2018 CLINICAL DATA:  NG tube placement. EXAM: PORTABLE ABDOMEN - 1 VIEW COMPARISON:  Prior study same day.  CT 11/18/2018. FINDINGS: Interim placement of NG tube, its tip is over the distal stomach. Unchanged small and large bowel distention. No free air. IMPRESSION: 1. Interim placement of NG tube, its tip is over the distal stomach. 2.  Unchanged small and large bowel distention. Electronically Signed   By: Marcello Moores  Register   On: 11/22/2018 07:51   Dg Abd Portable 1v  Result Date: 11/22/2018 CLINICAL DATA:  Abdominal pain EXAM: PORTABLE ABDOMEN - 1 VIEW COMPARISON:  10/17/2018 FINDINGS: There are loops of gas distended colon and small bowel. Surgical staples overlie the midline abdomen. IMPRESSION: Gas distended small bowel and colon, likely indicating postoperative adynamic ileus. Electronically Signed   By: Ulyses Jarred M.D.   On: 11/22/2018 02:08    Microbiology: No results found for this or any previous visit (from the past 240 hour(s)).   Labs: Basic Metabolic Panel: Recent Labs  Lab 11/26/18 0545 11/27/18 0536 11/28/18 0219 11/30/18 0512 12/02/18 0535  NA 137 138 140 140 139  K 4.1 4.0 3.7 3.8 4.4  CL 105 105 106 105 106  CO2 17* 19* 23 22 24   GLUCOSE 86 101* 101* 95 90  BUN <5* 6 8 5* 6  CREATININE 0.80 1.07 0.89 0.91 1.06  CALCIUM 8.4* 8.8* 8.6* 8.7* 8.3*  MG 1.9  --   --   --   --    Liver Function Tests: Recent Labs  Lab 11/28/18 0219  AST 25  ALT 32  ALKPHOS 68  BILITOT 0.5  PROT 6.1*  ALBUMIN 2.8*   No results for input(s): LIPASE, AMYLASE in the last 168 hours. No results for input(s): AMMONIA in the last 168 hours. CBC: Recent Labs  Lab 11/26/18 0545 11/27/18 0536 11/28/18 0219 11/30/18 0512 12/02/18 0535  WBC 7.8 8.0 8.1 5.6 5.9  NEUTROABS  --   --   --   --  3.6  HGB 10.3* 10.5* 10.5* 10.1* 9.5*  HCT 33.4*  33.0* 32.8* 31.9* 30.2*  MCV 85.6 82.3 82.8 81.8 82.5  PLT 241 337 379 399 412*   Cardiac Enzymes: No results for input(s): CKTOTAL, CKMB, CKMBINDEX, TROPONINI in the last 168 hours. BNP: BNP (last 3 results) Recent Labs    10/17/18 0423  BNP 19.4    ProBNP (last 3 results) No results for input(s): PROBNP in the last 8760 hours.  CBG: Recent Labs  Lab 11/30/18 1124 11/30/18 1600  GLUCAP 94 83       Signed:  Nita Sells MD   [facility] 12/02/2018, 8:57 AM

## 2018-12-02 NOTE — Progress Notes (Signed)
[facility] Progress Note  13 Days Post-Op  Subjective: Patient reports no nausea this AM and has not thrown up in 24 hrs. He reports that he is trying to eat smaller portions at once and that seems to be better for him. He is ambulating well and having bowel function. He just had his dressing changed shortly before shift change. We discussed possible discharge today and he feels like he may be ready this afternoon or tomorrow AM.   Objective: Vital signs in last 24 hours: Temp:  [97.7 F (36.5 C)-98.7 F (37.1 C)] 98.3 F (36.8 C) (11/19 0616) Pulse Rate:  [79-98] 79 (11/19 0616) Resp:  [16-18] 18 (11/19 0616) BP: (117-135)/(83-88) 117/87 (11/19 0616) SpO2:  [98 %-100 %] 99 % (11/19 0616) Weight:  [74.4 kg] 74.4 kg (11/19 0616) Last BM Date: 11/30/18  Intake/Output from previous day: 11/18 0701 - 11/19 0700 In: 240 [P.O.:240] Out: -  Intake/Output this shift: No intake/output data recorded.  PE: Gen: Alert, NAD, pleasant Card: Regular rate and rhythm, pedal pulses 2+ BL Pulm: Normal effort, clear to auscultation bilaterally Abd: Soft,mildly ttp along incision,mildlydistended,+BS, incisionwith clean packing in open portion at midline and staples c/d/i Skin: warm and dry, no rashes  Psych: A&Ox3  Lab Results:  Recent Labs    11/30/18 0512 12/02/18 0535  WBC 5.6 5.9  HGB 10.1* 9.5*  HCT 31.9* 30.2*  PLT 399 412*   BMET Recent Labs    11/30/18 0512 12/02/18 0535  NA 140 139  K 3.8 4.4  CL 105 106  CO2 22 24  GLUCOSE 95 90  BUN 5* 6  CREATININE 0.91 1.06  CALCIUM 8.7* 8.3*   PT/INR No results for input(s): LABPROT, INR in the last 72 hours. CMP     Component Value Date/Time   NA 139 12/02/2018 0535   NA 141 10/11/2018 1115   K 4.4 12/02/2018 0535   CL 106 12/02/2018 0535   CO2 24 12/02/2018 0535   GLUCOSE 90 12/02/2018 0535   BUN 6 12/02/2018 0535   BUN 9 10/11/2018 1115   CREATININE 1.06 12/02/2018 0535   CALCIUM 8.3 (L)  12/02/2018 0535   PROT 6.1 (L) 11/28/2018 0219   PROT 7.1 10/11/2018 1115   ALBUMIN 2.8 (L) 11/28/2018 0219   ALBUMIN 4.4 10/11/2018 1115   AST 25 11/28/2018 0219   ALT 32 11/28/2018 0219   ALKPHOS 68 11/28/2018 0219   BILITOT 0.5 11/28/2018 0219   BILITOT 0.2 10/11/2018 1115   GFRNONAA >60 12/02/2018 0535   GFRAA >60 12/02/2018 0535   Lipase     Component Value Date/Time   LIPASE 34 11/18/2018 0430       Studies/Results: Ct Abdomen Pelvis W Contrast  Result Date: 11/30/2018 CLINICAL DATA:  Status post right colectomy on 11/19/2022 colorectal cancer. Nausea and vomiting. EXAM: CT ABDOMEN AND PELVIS WITH CONTRAST TECHNIQUE: Multidetector CT imaging of the abdomen and pelvis was performed using the standard protocol following bolus administration of intravenous contrast. CONTRAST:  167mL OMNIPAQUE IOHEXOL 300 MG/ML  SOLN COMPARISON:  11/18/2018 FINDINGS: Lower chest: Unremarkable. Hepatobiliary: No suspicious focal abnormality within the liver parenchyma. There is no evidence for gallstones, gallbladder wall thickening, or pericholecystic fluid. No intrahepatic or extrahepatic biliary dilation. Pancreas: No focal mass lesion. No dilatation of the main duct. No intraparenchymal cyst. No peripancreatic edema. Spleen: No splenomegaly. No focal mass lesion. Adrenals/Urinary Tract: No adrenal nodule or mass. Right kidney unremarkable. Duplicated left intrarenal collecting system noted. No evidence for hydroureter.  The urinary bladder appears normal for the degree of distention. Stomach/Bowel: Stomach is moderately distended with oral contrast material. Duodenum is normally positioned as is the ligament of Treitz. Small bowel loops are mildly distended up to 3.5 cm diameter in the pelvis. Multiple small bowel adhesions are noted in the right abdomen. Fecalization of small bowel contents noted in the pelvis (61/3) suggesting decreased small-bowel transit. Although a discrete abrupt transition zone  cannot be identified, distal ileal loops are decompressed in the neo terminal ileum tracking into the ileocolic anastomosis is decompressed. Colon is diffusely decompressed. Vascular/Lymphatic: There is abdominal aortic atherosclerosis without aneurysm. There is no gastrohepatic or hepatoduodenal ligament lymphadenopathy. No intraperitoneal or retroperitoneal lymphadenopathy. No pelvic sidewall lymphadenopathy. Reproductive: The prostate gland and seminal vesicles are unremarkable. Other: Small volume intraperitoneal free fluid noted in the pelvis. There is some fluid in the right colectomy bed including a 2.9 x 1.3 x 2.9 cm collection anterior to the right kidney on axial 35/3. This collection contains a tiny gas bubble visible on 34/3. Another contain fluid collection is seen anterior to the right psoas muscle on axial 51/3, measuring 2.7 x 1.2 x 3.9 cm. Neither collection has a well organized or enhancing rim at this time. Musculoskeletal: Gas is identified in the midline wound, deep to the staple line. There is some gas in the subcutaneous fat of the lateral left abdominal wall, presumably injection sites. No worrisome lytic or sclerotic osseous abnormality. IMPRESSION: 1. Mid small bowel loops are distended up to 3.5 cm diameter with some fecalization of small bowel contents in the pelvis suggesting decreased small-bowel transit. No small bowel wall thickening. Although no abrupt transition zone is evident, the distal ileal loops are decompressed and the colon is diffusely decompressed. Imaging features may reflect ileus although given decompression of distal small bowel and colon, evolving small bowel obstruction is a concern. 2. Small volume free fluid in the pelvis with 2 somewhat loculated fluid collections in the right abdomen, neither of which shows a well organized or enhancing rim to suggest overt abscess. The more cranial of the 2 collections has a tiny associated gas bubble. Residual free gas from  surgery is not expected more than about 7-10 days from surgery which would put this at the outer margin of normal postsurgical change. Given that only a single very tiny gas bubble is evident, this is probably residual from surgery. 3. Gas is identified in the midline surgical wound deep to the staple line. Gas in the subcutaneous fat of the lateral left abdominal wall may be related to injection sites. 4.  Aortic Atherosclerois (ICD10-170.0) Electronically Signed   By: Misty Stanley M.D.   On: 11/30/2018 15:22   Dg Abd Portable 1v  Result Date: 12/01/2018 CLINICAL DATA:  Abdominal pain, ileus EXAM: PORTABLE ABDOMEN - 1 VIEW COMPARISON:  Portable exam 0654 hours compared to 11/30/2018 FINDINGS: Skin clips from prior laparotomy. Small amount retained contrast in colon. Dilated small bowel loops in mid abdomen favor postoperative ileus, little changed. Small pelvic phleboliths. Bones unremarkable. IMPRESSION: Persistent dilatation of small bowel loops favor postoperative ileus. Electronically Signed   By: Lavonia Dana M.D.   On: 12/01/2018 08:48    Anti-infectives: Anti-infectives (From admission, onward)   Start     Dose/Rate Route Frequency Ordered Stop   11/19/18 1223  clindamycin (CLEOCIN) IVPB 900 mg     900 mg 100 mL/hr over 30 Minutes Intravenous 60 min pre-op 11/19/18 1223 11/19/18 1430   11/19/18 1223  gentamicin (  GARAMYCIN) 390 mg in dextrose 5 % 100 mL IVPB     5 mg/kg  78.8 kg 109.8 mL/hr over 60 Minutes Intravenous 60 min pre-op 11/19/18 1223 11/19/18 1500       Assessment/Plan HTN  HLD Asthma-TRH tapering prednisone Thyroid disease Deaf  Right colon mass, colonic obstruction S/pRight colectomy11/6 [name] - POD#13 - surgical path:Invasive adenocarcinomawith 2/14 positive lymph nodes >>Oncology plans to see outpatient -BID dressing changes to midline wound, removed some staples for wound infection - improving  - CT 11/17 just with post-op ileus - continue  to mobilize, repeat suppository prn  ID -abx periop FEN -continue soft diet as tolerated VTE -SCDs, lovenox Foley -d/c 11/7 Follow up -[name]  Plan:Patient stable for discharge from surgery perspective. Follow up in chart.   LOS: 13 days    Brigid Re , [facility] Surgery 12/02/2018, 7:25 AM Please see Amion for pager number during day hours 7:00am-4:30pm

## 2018-12-02 NOTE — Discharge Instructions (Signed)
[facility], [place] [phone]  OPEN ABDOMINAL SURGERY: POST OP INSTRUCTIONS  Always review your discharge instruction sheet given to you by the facility where your surgery was performed.  IF YOU HAVE DISABILITY OR FAMILY LEAVE FORMS, YOU MUST BRING THEM TO THE OFFICE FOR PROCESSING.  PLEASE DO NOT GIVE THEM TO YOUR DOCTOR.  1. A prescription for pain medication may be given to you upon discharge.  Take your pain medication as prescribed, if needed.  If narcotic pain medicine is not needed, then you may take acetaminophen (Tylenol) or ibuprofen (Advil) as needed. 2. Take your usually prescribed medications unless otherwise directed. 3. If you need a refill on your pain medication, please contact your pharmacy. They will contact our office to request authorization.  Prescriptions will not be filled after 5pm or on week-ends. 4. You should follow a light diet the first few days after arrival home, such as soup and crackers, pudding, etc.unless your doctor has advised otherwise. A high-fiber, low fat diet can be resumed as tolerated.   Be sure to include lots of fluids daily. Most patients will experience some swelling and bruising on the chest and neck area.  Ice packs will help.  Swelling and bruising can take several days to resolve 5. Most patients will experience some swelling and bruising in the area of the incision. Ice pack will help. Swelling and bruising can take several days to resolve..  6. It is common to experience some constipation if taking pain medication after surgery.  Increasing fluid intake and taking a stool softener will usually help or prevent this problem from occurring.  A mild laxative (Milk of Magnesia or Miralax) should be taken according to package directions if there are no bowel movements after 48 hours. 7.  You may have steri-strips (small skin tapes) in place directly over the incision.  These strips should be left on the skin for 7-10 days.  If your  surgeon used skin glue on the incision, you may shower in 24 hours.  The glue will flake off over the next 2-3 weeks.  Any sutures or staples will be removed at the office during your follow-up visit. You may find that a light gauze bandage over your incision may keep your staples from being rubbed or pulled. You may shower and replace the bandage daily. 8. ACTIVITIES:  You may resume regular (light) daily activities beginning the next day--such as daily self-care, walking, climbing stairs--gradually increasing activities as tolerated.  You may have sexual intercourse when it is comfortable.  Refrain from any heavy lifting or straining until approved by your doctor. a. You may drive when you no longer are taking prescription pain medication, you can comfortably wear a seatbelt, and you can safely maneuver your car and apply brakes  9. You should see your doctor in the office for a follow-up appointment approximately two weeks after your surgery.  Make sure that you call for this appointment within a day or two after you arrive home to insure a convenient appointment time.  WHEN TO CALL YOUR DOCTOR: 1. Fever over 101.0 2. Inability to urinate 3. Nausea and/or vomiting 4. Extreme swelling or bruising 5. Continued bleeding from incision. 6. Increased pain, redness, or drainage from the incision. 7. Difficulty swallowing or breathing 8. Muscle cramping or spasms. 9. Numbness or tingling in hands or feet or around lips.  The clinic staff is available to answer your questions during regular business hours.  Please  dont hesitate to call and ask to speak to one of the nurses if you have concerns.  For further questions, please visit [email or web address]  Wound Care: - shower daily with wound uncovered - remove packing after showering - loosely repack wound with packing strips after you have dried off - cover with dry dressing

## 2018-12-02 NOTE — Progress Notes (Signed)
Pt wrote down that he was nauseous RN gave Phenergan, soon after pt wife walked in the room and I let them know that I was going to get the discharge paper work so we could get him home. Pt wife wrote that they want the medication to kick in and him to feel better first. I let them know that I would give it a couple minutes and check back.

## 2018-12-06 MED FILL — MONTELUKAST SOD 10 MG TAB: 10 | 90 days supply | Qty: 90 | Fill #1

## 2018-12-08 ENCOUNTER — Ambulatory Visit: Payer: Medicare Other | Admitting: Nurse Practitioner

## 2018-12-08 ENCOUNTER — Telehealth: Payer: Self-pay | Admitting: Oncology

## 2018-12-08 NOTE — Telephone Encounter (Signed)
I received a call from the pt's interpreter to reschedule his appt w/Lisa due to transportation issues. I cld and rescheduled him to see Dr. Benay Spice on 12/1 at 2pm. I emailed the transportation coordinator to get the pt setup for a ride.

## 2018-12-13 ENCOUNTER — Other Ambulatory Visit (INDEPENDENT_AMBULATORY_CARE_PROVIDER_SITE_OTHER): Payer: Self-pay

## 2018-12-13 DIAGNOSIS — Z760 Encounter for issue of repeat prescription: Secondary | ICD-10-CM

## 2018-12-13 MED ORDER — ALBUTEROL SULFATE HFA 108 (90 BASE) MCG/ACT IN AERS: 2.0000 | INHALATION_SPRAY | RESPIRATORY_TRACT | 3 refills | Status: DC | PRN

## 2018-12-13 MED FILL — BREO ELLIPTA 200-25 MCG INH: 200-25 | 30 days supply | Qty: 60 | Fill #1

## 2018-12-13 MED FILL — ALBUTEROL SULFATE HFA 108 (: 108 (90 BAS | 25 days supply | Qty: 18 | Fill #0

## 2018-12-13 NOTE — Telephone Encounter (Signed)
Patient called to request refill of ventolin inhaler. Refill sent to [facility]. Nat Christen, CMA

## 2018-12-14 ENCOUNTER — Telehealth: Payer: Self-pay | Admitting: Oncology

## 2018-12-14 ENCOUNTER — Inpatient Hospital Stay: Payer: Medicare Other | Attending: Nurse Practitioner | Admitting: Oncology

## 2018-12-14 ENCOUNTER — Other Ambulatory Visit: Payer: Self-pay

## 2018-12-14 VITALS — BP 117/76 | HR 86 | Temp 98.7°F | Resp 17 | Ht 70.0 in | Wt 164.0 lb

## 2018-12-14 DIAGNOSIS — C182 Malignant neoplasm of ascending colon: Secondary | ICD-10-CM | POA: Diagnosis not present

## 2018-12-14 DIAGNOSIS — H9190 Unspecified hearing loss, unspecified ear: Secondary | ICD-10-CM

## 2018-12-14 DIAGNOSIS — Z5111 Encounter for antineoplastic chemotherapy: Secondary | ICD-10-CM | POA: Insufficient documentation

## 2018-12-14 DIAGNOSIS — J45909 Unspecified asthma, uncomplicated: Secondary | ICD-10-CM | POA: Diagnosis not present

## 2018-12-14 DIAGNOSIS — E079 Disorder of thyroid, unspecified: Secondary | ICD-10-CM

## 2018-12-14 DIAGNOSIS — D701 Agranulocytosis secondary to cancer chemotherapy: Secondary | ICD-10-CM | POA: Insufficient documentation

## 2018-12-14 DIAGNOSIS — C779 Secondary and unspecified malignant neoplasm of lymph node, unspecified: Secondary | ICD-10-CM | POA: Diagnosis not present

## 2018-12-14 DIAGNOSIS — T451X5A Adverse effect of antineoplastic and immunosuppressive drugs, initial encounter: Secondary | ICD-10-CM | POA: Insufficient documentation

## 2018-12-14 DIAGNOSIS — C189 Malignant neoplasm of colon, unspecified: Secondary | ICD-10-CM

## 2018-12-14 NOTE — Telephone Encounter (Signed)
Gave avs and calendar ° °

## 2018-12-14 NOTE — Progress Notes (Signed)
**Note Alexander-Identified via Obfuscation** Alexander Reeves New Patient Consult   Requesting MD: Leonie Douglas  ROLFE HARTSELL Sr. 53 y.o.  1965/11/13    Reason for Consult: Colon cancer   HPI: Mr. Bartolucci is here today with his wife and a sign language interpreter.  Alexander Reeves reports developing abdominal pain approximately 1 month ago.  He presented to the emergency room with generalized cramping abdominal pain on 11/18/2018.  He has been seen in the emergency room on 10/17/2018 with chest and abdominal pain.  CTs were negative for an acute process. A CT of the abdomen and pelvis on 11/18/2018 revealed no focal liver lesion.  Wall thickening was noted in the mid ascending colon with mild distention of the cecum and proximal ascending colon.  Stool was noted in the distal ileum.  No adenopathy.  Dr. Tarri Glenn was consulted.  A colonoscopy the 11/19/2018 revealed a completely obstructing mass in the mid ascending colon.  The colonoscope could not be advanced beyond the mass.  The mass was biopsied and the area was tattooed.  The pathology revealed invasive adenocarcinoma.    He was taken the operating room by [name] 11/19/2018 for a right colectomy.  He was noted to have a large tumor adherent to the duodenum and head of the pancreas.  There is not appear to be direct invasion, but a desmoplastic reaction.  The tumor was dissected off of the head of the pancreas and duodenum.  There was bleeding from the pancreas that was controlled.  The pathology ([id number]) revealed an invasive moderately differentiated adenocarcinoma ascending colon.  No macroscopic tumor perforation.  Tumor extended through the muscularis propria into pericolonic soft tissue.  Adenocarcinoma was present at the inked radial margin.  Lymphovascular and perineural invasion are present.  Tumor deposits are present.  2 of 14 lymph nodes contained metastatic carcinoma.  On the gross specimen an indurated nodular mass consistent with a matted group of  lymph nodes was noted at the radial margin.  Alexander Reeves continues to the abdominal wound which is healing.  Past Medical History:  Diagnosis Date  . Arthritis    KNEES  . Asthma   . Deaf   . GERD (gastroesophageal reflux disease)    OCC  . Scrotal cyst   . Thyroid disease   . Urinary frequency    OCC    Past Surgical History:  Procedure Laterality Date  . BIOPSY  11/19/2018   Procedure: BIOPSY;  Surgeon: Thornton Park, MD;  Location: [facility];  Service: Gastroenterology;;  . BLADDER SURGERY  YRS AGO  . COLONOSCOPY WITH PROPOFOL N/A 11/19/2018   Procedure: COLONOSCOPY WITH PROPOFOL;  Surgeon: Thornton Park, MD;  Location: [facility];  Service: Gastroenterology;  Laterality: N/A;  . EPIDIDYMECTOMY Right 03/22/2018   Procedure: SCROTAL EXPLORATION RIGHT  PARTIAL EPIDIDYMECTOMY;  Surgeon: [name], MD;  Location: [facility];  Service: Urology;  Laterality: Right;  . HEMORRHOID SURGERY  WHEN YOUNG  . LAPAROTOMY N/A 11/19/2018   Procedure: EXPLORATORY LAPAROTOMY;  Surgeon: Rolm Bookbinder, MD;  Location: [facility];  Service: General;  Laterality: N/A;  . NO PAST SURGERIES    . PARTIAL COLECTOMY Right 11/19/2018   Procedure: PARTIAL COLECTOMY;  Surgeon: Rolm Bookbinder, MD;  Location: [facility];  Service: General;  Laterality: Right;  . SUBMUCOSAL TATTOO INJECTION  11/19/2018   Procedure: SUBMUCOSAL TATTOO INJECTION;  Surgeon: Thornton Park, MD;  Location: [facility];  Service: Gastroenterology;;  . [name]    Medications:  Reviewed  Allergies:  Allergies  Allergen Reactions  . Cheese Shortness Of Breath and Swelling    Swelling of the throat  . Eggs Or Egg-Derived Products Shortness Of Breath and Swelling    Swelling of the throat  . Penicillins Anaphylaxis    Has patient had a PCN reaction causing immediate rash, facial/tongue/throat swelling, SOB or lightheadedness with hypotension: Yes Has patient had a PCN reaction causing  severe rash involving mucus membranes or skin necrosis: Unknown Has patient had a PCN reaction that required hospitalization: Unknown Has patient had a PCN reaction occurring within the last 10 years: Unknown If all of the above answers are "NO", then may proceed with Cephalosporin use.     Family history: His paternal grandfather had "cancer "and was a smoker.  He has 2 sisters and 2 children.  Social History:   He lives with his wife in [place].  He is currently unemployed.  He smokes cigarettes and marijuana in the past.  He reports occasional alcohol use.  He has been all heavily in the past.  No transfusion history.  No risk factor for HIV or hepatitis.  ROS:   Positives include: Abdominal pain prior to surgery, hemorrhoid pain in the 1990s  A complete ROS was otherwise negative.  Physical Exam:  Blood pressure 117/76, pulse 86, temperature 98.7 F (37.1 C), temperature source Temporal, resp. rate 17, height '5\' 10"'  (1.778 m), weight 164 lb (74.4 kg), SpO2 100 %.  Limited physical examination secondary to distancing with the Covid pandemic Lungs: Clear bilaterally, no respiratory distress Cardiac: Regular rhythm with occasional pulse Abdomen: No hepatosplenomegaly, midline incision has almost completely healed with a 1 cm superficial opening distally  Vascular: No leg edema Lymph nodes: No cervical, supraclavicular, axillary, or inguinal nodes Neurologic: Alert and oriented, the motor exam appears intact in the upper and lower extremities bilaterally Skin: No rash Musculoskeletal: No spine tenderness   LAB:  CBC  Lab Results  Component Value Date   WBC 5.9 12/02/2018   HGB 9.5 (L) 12/02/2018   HCT 30.2 (L) 12/02/2018   MCV 82.5 12/02/2018   PLT 412 (H) 12/02/2018   NEUTROABS 3.6 12/02/2018        CMP  Lab Results  Component Value Date   NA 139 12/02/2018   K 4.4 12/02/2018   CL 106 12/02/2018   CO2 24 12/02/2018   GLUCOSE 90 12/02/2018   BUN 6  12/02/2018   CREATININE 1.06 12/02/2018   CALCIUM 8.3 (L) 12/02/2018   PROT 6.1 (L) 11/28/2018   ALBUMIN 2.8 (L) 11/28/2018   AST 25 11/28/2018   ALT 32 11/28/2018   ALKPHOS 68 11/28/2018   BILITOT 0.5 11/28/2018   GFRNONAA >60 12/02/2018   GFRAA >60 12/02/2018     Lab Results  Component Value Date   CEA1 2.7 11/18/2018    Imaging:  As per HPI, CT images from 11/18/2018 reviewed   Assessment/Plan:   1. Moderately differentiated adenocarcinoma ascending colon, stage IIIb (pT3pN1), status post a right colectomy 11/19/2018  Lymphovascular and perineural invasion present, 2/14 lymph nodes positive, tumor deposits present  Positive radial margin, no loss of mismatch repair protein expression  Colonoscopy 11/19/2018-completely obstructing mid ascending colon mass, could not be passed with endoscope, biopsy confirmed invasive adenocarcinoma  CT abdomen/pelvis 11/18/2018-wall thickening at the mid and distal ascending colon with mild distention of the proximal ascending colon and cecum  CTs 10/17/2018--no acute findings, no chest lymphadenopathy, lungs clear  2. Deaf 3.   Right  epididymal cyst removal 03/22/2018 4.   Asthma   Disposition:   Mr. Lall has been diagnosed with stage III colon cancer.  I discussed the prognosis and adjuvant treatment options with Alexander Reeves and his wife.  This discussion was facilitated a sign language interpreter. We reviewed details of the surgical pathology report.  There is a significant chance of developing recurrent colorectal cancer over the next few years.  I will clarify the surgical margin, lymph node status, number of tumor deposits, and MSI status with the pathology service.  I will present his case at the GI tumor conference.  I recommend adjuvant FOLFOX chemotherapy.  We discussed the expected benefit associated with adjuvant 5-fluorouracil and oxaliplatin based chemotherapy.  We reviewed the potential toxicities associated with this  regimen including the chance for nausea/vomiting, mucositis, diarrhea, alopecia, and hematologic toxicity.  We discussed the rash, hyperpigmentation, sun sensitivity, and hand/foot syndrome seen with 5-fluorouracil.  We discussed the allergic reaction and various types of neuropathy associated with oxaliplatin.  He agrees to proceed.  Alexander Reeves will attend a chemotherapy teaching class on 12/15/2018.  I will refer him to [name] for placement of Port-A-Cath.  He does not appear to have hereditary nonpolyposis colon cancer syndrome, but his family members are at increased risk of developing colorectal cancer and should receive appropriate screening.  We discussed this recommendation today.  Mr. Saber will return for an office visit and cycle 1 FOLFOX on 12/28/2018. Betsy Coder, MD  12/14/2018, 5:38 PM

## 2018-12-15 ENCOUNTER — Inpatient Hospital Stay: Payer: Medicare Other

## 2018-12-15 ENCOUNTER — Other Ambulatory Visit: Payer: Self-pay | Admitting: General Surgery

## 2018-12-15 ENCOUNTER — Other Ambulatory Visit: Payer: Self-pay | Admitting: *Deleted

## 2018-12-15 MED ORDER — ONDANSETRON HCL 8 MG PO TABS: 8.0000 mg | ORAL_TABLET | Freq: Three times a day (TID) | ORAL | 1 refills | Status: DC | PRN

## 2018-12-15 MED ORDER — LIDOCAINE-PRILOCAINE 2.5-2.5 % EX CREA: 1.0000 "application " | TOPICAL_CREAM | CUTANEOUS | 1 refills | Status: DC

## 2018-12-15 MED ORDER — PROCHLORPERAZINE MALEATE 10 MG PO TABS: 10.0000 mg | ORAL_TABLET | Freq: Four times a day (QID) | ORAL | 1 refills | Status: DC | PRN

## 2018-12-15 MED FILL — LIDOCAINE-PRILOCAINE CREAM: 2.5-2.5 | 15 days supply | Qty: 30 | Fill #0

## 2018-12-15 MED FILL — ONDANSETRON HCL 8 MG TABLET: 8 | 10 days supply | Qty: 30 | Fill #0

## 2018-12-15 MED FILL — PROCHLORPERAZINE 10 MG TAB: 10 | 15 days supply | Qty: 60 | Fill #0

## 2018-12-15 NOTE — Progress Notes (Signed)
Anti-emetic scripts and EMLA cream sent to [facility].

## 2018-12-17 ENCOUNTER — Encounter (HOSPITAL_BASED_OUTPATIENT_CLINIC_OR_DEPARTMENT_OTHER): Payer: Self-pay | Admitting: *Deleted

## 2018-12-17 ENCOUNTER — Other Ambulatory Visit: Payer: Self-pay

## 2018-12-17 NOTE — Pre-Procedure Instructions (Signed)
Pre op call done with ESL interpreter. Per Dr Conrad Eagle, pt may have breakfast on the dos by 7am, water until 12n, npo p noon. Pt verbalized understanding of above.

## 2018-12-20 ENCOUNTER — Other Ambulatory Visit (HOSPITAL_COMMUNITY)
Admission: RE | Admit: 2018-12-20 | Discharge: 2018-12-20 | Disposition: A | Discharge status: Home / Self Care | Payer: Medicare Other | Source: Ambulatory Visit | Attending: General Surgery | Admitting: General Surgery

## 2018-12-20 DIAGNOSIS — Z20828 Contact with and (suspected) exposure to other viral communicable diseases: Secondary | ICD-10-CM | POA: Insufficient documentation

## 2018-12-20 DIAGNOSIS — Z01812 Encounter for preprocedural laboratory examination: Secondary | ICD-10-CM | POA: Diagnosis present

## 2018-12-21 ENCOUNTER — Other Ambulatory Visit: Payer: Self-pay | Admitting: Oncology

## 2018-12-21 LAB — NOVEL CORONAVIRUS, NAA (HOSP ORDER, SEND-OUT TO REF LAB; TAT 18-24 HRS): SARS-CoV-2, NAA: NOT DETECTED

## 2018-12-21 NOTE — Progress Notes (Signed)
START OFF PATHWAY REGIMEN - Colorectal   [id number]:FOLFOX (q14d) **2 cycles per order sheet**:   A cycle is every 14 days:     Oxaliplatin      Leucovorin      Fluorouracil      Fluorouracil   **Always confirm dose/schedule in your pharmacy ordering system**  Patient Characteristics: Postoperative without Neoadjuvant Therapy (Pathologic Staging), Colon, Stage III, Low Risk (pT1-3, pN1) Tumor Location: Colon Therapeutic Status: Postoperative without Neoadjuvant Therapy (Pathologic Staging) AJCC M Category: Staged < 8th Ed. AJCC T Category: Staged < 8th Ed. AJCC N Category: Staged < 8th Ed. AJCC 8 Stage Grouping: Staged < 8th Ed. Intent of Therapy: Curative Intent, Discussed with Patient

## 2018-12-23 ENCOUNTER — Encounter (HOSPITAL_BASED_OUTPATIENT_CLINIC_OR_DEPARTMENT_OTHER): Payer: Self-pay | Admitting: General Surgery

## 2018-12-23 ENCOUNTER — Encounter (HOSPITAL_BASED_OUTPATIENT_CLINIC_OR_DEPARTMENT_OTHER)
Admission: RE | Disposition: A | Discharge status: Home / Self Care | Payer: Self-pay | Source: Home / Self Care | Attending: General Surgery

## 2018-12-23 ENCOUNTER — Other Ambulatory Visit: Payer: Self-pay

## 2018-12-23 ENCOUNTER — Ambulatory Visit (HOSPITAL_BASED_OUTPATIENT_CLINIC_OR_DEPARTMENT_OTHER): Payer: Medicare Other | Admitting: Anesthesiology

## 2018-12-23 ENCOUNTER — Ambulatory Visit (HOSPITAL_COMMUNITY): Payer: Medicare Other

## 2018-12-23 ENCOUNTER — Ambulatory Visit (HOSPITAL_BASED_OUTPATIENT_CLINIC_OR_DEPARTMENT_OTHER)
Admission: RE | Admit: 2018-12-23 | Discharge: 2018-12-23 | Disposition: A | Discharge status: Home / Self Care | Payer: Medicare Other | Attending: General Surgery | Admitting: General Surgery

## 2018-12-23 DIAGNOSIS — Z9049 Acquired absence of other specified parts of digestive tract: Secondary | ICD-10-CM | POA: Diagnosis not present

## 2018-12-23 DIAGNOSIS — K219 Gastro-esophageal reflux disease without esophagitis: Secondary | ICD-10-CM | POA: Insufficient documentation

## 2018-12-23 DIAGNOSIS — Z880 Allergy status to penicillin: Secondary | ICD-10-CM | POA: Insufficient documentation

## 2018-12-23 DIAGNOSIS — Z87891 Personal history of nicotine dependence: Secondary | ICD-10-CM | POA: Diagnosis not present

## 2018-12-23 DIAGNOSIS — C189 Malignant neoplasm of colon, unspecified: Secondary | ICD-10-CM | POA: Diagnosis not present

## 2018-12-23 DIAGNOSIS — M170 Bilateral primary osteoarthritis of knee: Secondary | ICD-10-CM | POA: Insufficient documentation

## 2018-12-23 DIAGNOSIS — C182 Malignant neoplasm of ascending colon: Secondary | ICD-10-CM | POA: Diagnosis not present

## 2018-12-23 DIAGNOSIS — Z8249 Family history of ischemic heart disease and other diseases of the circulatory system: Secondary | ICD-10-CM | POA: Diagnosis not present

## 2018-12-23 DIAGNOSIS — Z79899 Other long term (current) drug therapy: Secondary | ICD-10-CM | POA: Diagnosis not present

## 2018-12-23 DIAGNOSIS — J45909 Unspecified asthma, uncomplicated: Secondary | ICD-10-CM | POA: Diagnosis not present

## 2018-12-23 DIAGNOSIS — Z419 Encounter for procedure for purposes other than remedying health state, unspecified: Secondary | ICD-10-CM

## 2018-12-23 DIAGNOSIS — Z7951 Long term (current) use of inhaled steroids: Secondary | ICD-10-CM | POA: Diagnosis not present

## 2018-12-23 DIAGNOSIS — E079 Disorder of thyroid, unspecified: Secondary | ICD-10-CM | POA: Diagnosis not present

## 2018-12-23 DIAGNOSIS — Z95828 Presence of other vascular implants and grafts: Secondary | ICD-10-CM

## 2018-12-23 HISTORY — PX: PORTACATH PLACEMENT: SHX2246

## 2018-12-23 IMAGING — CR DG CHEST 1V PORT
1 series · 1 of 1 positions shown · non-contrast
Comparison: CTA chest, abdomen and pelvis [DATE], radiograph
[DATE]

CLINICAL DATA: Port-A-Cath placement

EXAM:
PORTABLE CHEST 1 VIEW

[chest ap]
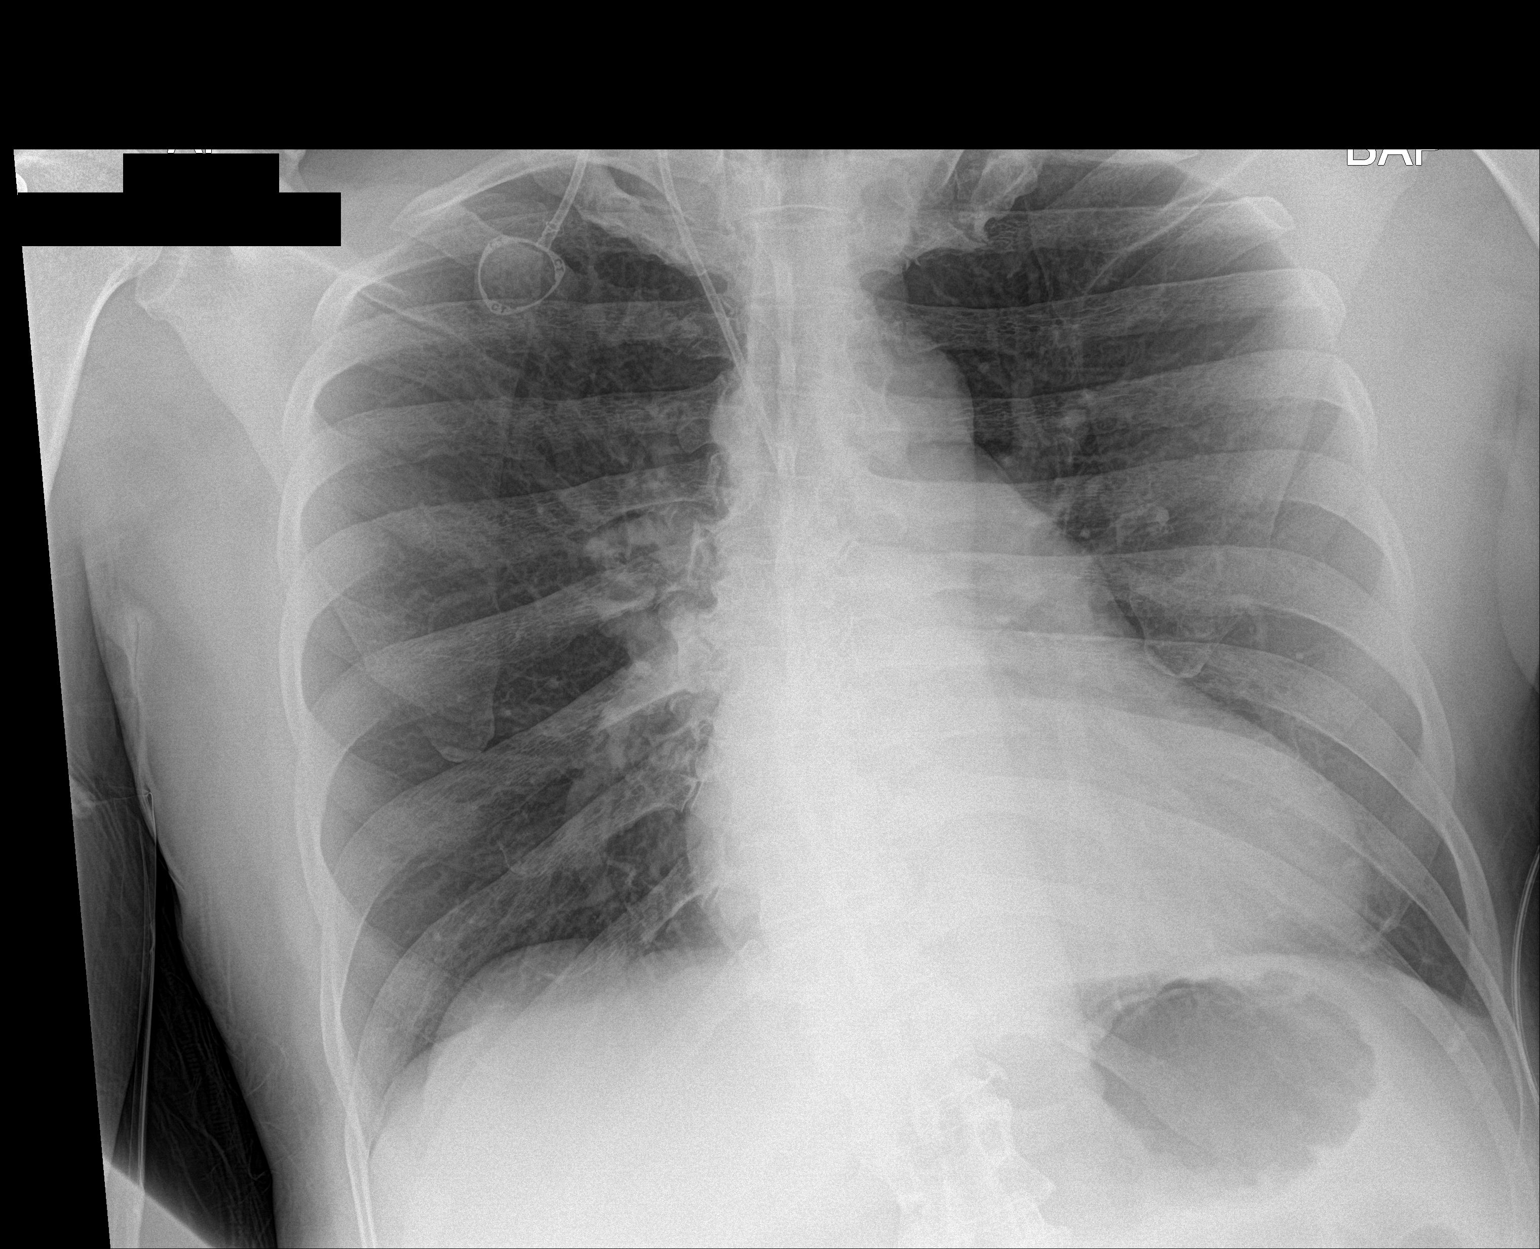

[1 of 1 positions shown; findings below may reference images not displayed]

FINDINGS: Right IJ approach Port-A-Cath position in the subcutaneous tissues
of the right upper chest wall and in the tip in the region of the
mid SVC. No consolidation, features of edema, pneumothorax, or
effusion. Pulmonary vascularity is normally distributed. The
cardiomediastinal contours are unremarkable. No acute osseous or
soft tissue abnormality.
IMPRESSION: 1. Right IJ approach Port-A-Cath position with tip in the region of
the mid SVC.
2.  No acute cardiopulmonary abnormality.

## 2018-12-23 SURGERY — INSERTION, TUNNELED CENTRAL VENOUS DEVICE, WITH PORT
Anesthesia: Monitor Anesthesia Care | Site: Chest | Laterality: Right

## 2018-12-23 MED ORDER — FENTANYL CITRATE (PF) 100 MCG/2ML IJ SOLN: 25.0000 ug | INTRAMUSCULAR | Status: DC | PRN

## 2018-12-23 MED ORDER — LIDOCAINE 2% (20 MG/ML) 5 ML SYRINGE
INTRAMUSCULAR | Status: AC
  Filled 2018-12-23: qty 5

## 2018-12-23 MED ORDER — OXYCODONE HCL 5 MG/5ML PO SOLN: 5.0000 mg | Freq: Once | ORAL | Status: DC | PRN

## 2018-12-23 MED ORDER — ACETAMINOPHEN 160 MG/5ML PO SOLN: 325.0000 mg | ORAL | Status: DC | PRN

## 2018-12-23 MED ORDER — ONDANSETRON HCL 4 MG/2ML IJ SOLN
INTRAMUSCULAR | Status: DC | PRN
  Administered 2018-12-23: 4 mg via INTRAVENOUS

## 2018-12-23 MED ORDER — PHENYLEPHRINE 40 MCG/ML (10ML) SYRINGE FOR IV PUSH (FOR BLOOD PRESSURE SUPPORT)
PREFILLED_SYRINGE | INTRAVENOUS | Status: AC
  Filled 2018-12-23: qty 10

## 2018-12-23 MED ORDER — OXYCODONE HCL 5 MG PO TABS: 5.0000 mg | ORAL_TABLET | Freq: Once | ORAL | Status: DC | PRN

## 2018-12-23 MED ORDER — LACTATED RINGERS IV SOLN
INTRAVENOUS | Status: DC
  Administered 2018-12-23: 15:00:00 via INTRAVENOUS

## 2018-12-23 MED ORDER — FENTANYL CITRATE (PF) 250 MCG/5ML IJ SOLN
INTRAMUSCULAR | Status: DC | PRN
  Administered 2018-12-23: 25 ug via INTRAVENOUS
  Administered 2018-12-23: 50 ug via INTRAVENOUS
  Administered 2018-12-23: 25 ug via INTRAVENOUS

## 2018-12-23 MED ORDER — CIPROFLOXACIN IN D5W 400 MG/200ML IV SOLN
INTRAVENOUS | Status: AC
  Filled 2018-12-23: qty 200

## 2018-12-23 MED ORDER — ACETAMINOPHEN 500 MG PO TABS
1000.0000 mg | ORAL_TABLET | ORAL | Status: AC
  Administered 2018-12-23: 1000 mg via ORAL

## 2018-12-23 MED ORDER — HEPARIN SOD (PORK) LOCK FLUSH 100 UNIT/ML IV SOLN
INTRAVENOUS | Status: AC
  Filled 2018-12-23: qty 5

## 2018-12-23 MED ORDER — ACETAMINOPHEN 325 MG PO TABS: 325.0000 mg | ORAL_TABLET | ORAL | Status: DC | PRN

## 2018-12-23 MED ORDER — ALBUTEROL SULFATE HFA 108 (90 BASE) MCG/ACT IN AERS
INHALATION_SPRAY | RESPIRATORY_TRACT | Status: DC | PRN
  Administered 2018-12-23: 2 via RESPIRATORY_TRACT

## 2018-12-23 MED ORDER — MEPERIDINE HCL 25 MG/ML IJ SOLN: 6.2500 mg | INTRAMUSCULAR | Status: DC | PRN

## 2018-12-23 MED ORDER — BUPIVACAINE HCL (PF) 0.25 % IJ SOLN
INTRAMUSCULAR | Status: DC | PRN
  Administered 2018-12-23: 7.5 mL

## 2018-12-23 MED ORDER — HEPARIN (PORCINE) IN NACL 1000-0.9 UT/500ML-% IV SOLN
INTRAVENOUS | Status: AC
  Filled 2018-12-23: qty 500

## 2018-12-23 MED ORDER — HEPARIN (PORCINE) IN NACL 2-0.9 UNITS/ML
INTRAMUSCULAR | Status: AC | PRN
  Administered 2018-12-23: 1 via INTRAVENOUS

## 2018-12-23 MED ORDER — ONDANSETRON HCL 4 MG/2ML IJ SOLN: 4.0000 mg | Freq: Once | INTRAMUSCULAR | Status: DC | PRN

## 2018-12-23 MED ORDER — CIPROFLOXACIN IN D5W 400 MG/200ML IV SOLN
400.0000 mg | INTRAVENOUS | Status: AC
  Administered 2018-12-23: 400 mg via INTRAVENOUS

## 2018-12-23 MED ORDER — ACETAMINOPHEN 500 MG PO TABS
ORAL_TABLET | ORAL | Status: AC
  Filled 2018-12-23: qty 2

## 2018-12-23 MED ORDER — PHENYLEPHRINE 40 MCG/ML (10ML) SYRINGE FOR IV PUSH (FOR BLOOD PRESSURE SUPPORT)
PREFILLED_SYRINGE | INTRAVENOUS | Status: DC | PRN
  Administered 2018-12-23 (×3): 80 ug via INTRAVENOUS

## 2018-12-23 MED ORDER — LIDOCAINE HCL (CARDIAC) PF 100 MG/5ML IV SOSY
PREFILLED_SYRINGE | INTRAVENOUS | Status: DC | PRN
  Administered 2018-12-23: 60 mg via INTRAVENOUS

## 2018-12-23 MED ORDER — HEPARIN SOD (PORK) LOCK FLUSH 100 UNIT/ML IV SOLN
INTRAVENOUS | Status: DC | PRN
  Administered 2018-12-23: 500 [IU] via INTRAVENOUS

## 2018-12-23 MED ORDER — MIDAZOLAM HCL 2 MG/2ML IJ SOLN
INTRAMUSCULAR | Status: AC
  Filled 2018-12-23: qty 2

## 2018-12-23 MED ORDER — ALBUTEROL SULFATE HFA 108 (90 BASE) MCG/ACT IN AERS
INHALATION_SPRAY | RESPIRATORY_TRACT | Status: AC
  Filled 2018-12-23: qty 6.7

## 2018-12-23 MED ORDER — PROPOFOL 10 MG/ML IV BOLUS
INTRAVENOUS | Status: DC | PRN
  Administered 2018-12-23 (×2): 20 mg via INTRAVENOUS
  Administered 2018-12-23: 40 mg via INTRAVENOUS
  Administered 2018-12-23: 75 ug/kg/min via INTRAVENOUS
  Administered 2018-12-23: 90 mg via INTRAVENOUS

## 2018-12-23 MED ORDER — ACETAMINOPHEN 10 MG/ML IV SOLN: 1000.0000 mg | Freq: Once | INTRAVENOUS | Status: DC | PRN

## 2018-12-23 MED ORDER — FENTANYL CITRATE (PF) 100 MCG/2ML IJ SOLN
INTRAMUSCULAR | Status: AC
  Filled 2018-12-23: qty 2

## 2018-12-23 MED ORDER — MIDAZOLAM HCL 2 MG/2ML IJ SOLN
INTRAMUSCULAR | Status: DC | PRN
  Administered 2018-12-23: 2 mg via INTRAVENOUS

## 2018-12-23 SURGICAL SUPPLY — 53 items
BAG DECANTER FOR FLEXI CONT (MISCELLANEOUS) ×3 IMPLANT
BENZOIN TINCTURE PRP APPL 2/3 (GAUZE/BANDAGES/DRESSINGS) ×3 IMPLANT
BLADE SURG 11 STRL SS (BLADE) ×3 IMPLANT
BLADE SURG 15 STRL LF DISP TIS (BLADE) ×1 IMPLANT
BLADE SURG 15 STRL SS (BLADE) ×2
CANISTER SUCT 1200ML W/VALVE (MISCELLANEOUS) IMPLANT
CHLORAPREP W/TINT 26 (MISCELLANEOUS) ×3 IMPLANT
CLOSURE WOUND 1/2 X4 (GAUZE/BANDAGES/DRESSINGS) ×1
COVER BACK TABLE REUSABLE LG (DRAPES) ×3 IMPLANT
COVER MAYO STAND REUSABLE (DRAPES) ×3 IMPLANT
COVER PROBE 5X48 (MISCELLANEOUS) ×2
COVER WAND RF STERILE (DRAPES) IMPLANT
DECANTER SPIKE VIAL GLASS SM (MISCELLANEOUS) IMPLANT
DERMABOND ADVANCED (GAUZE/BANDAGES/DRESSINGS) ×2
DERMABOND ADVANCED .7 DNX12 (GAUZE/BANDAGES/DRESSINGS) ×1 IMPLANT
DRAPE C-ARM 42X72 X-RAY (DRAPES) ×3 IMPLANT
DRAPE LAPAROSCOPIC ABDOMINAL (DRAPES) ×3 IMPLANT
DRAPE UTILITY XL STRL (DRAPES) ×3 IMPLANT
DRSG TEGADERM 4X4.75 (GAUZE/BANDAGES/DRESSINGS) IMPLANT
ELECT COATED BLADE 2.86 ST (ELECTRODE) ×3 IMPLANT
ELECT REM PT RETURN 9FT ADLT (ELECTROSURGICAL) ×3
ELECTRODE REM PT RTRN 9FT ADLT (ELECTROSURGICAL) ×1 IMPLANT
GAUZE SPONGE 4X4 12PLY STRL LF (GAUZE/BANDAGES/DRESSINGS) ×3 IMPLANT
GLOVE BIO SURGEON STRL SZ7 (GLOVE) ×6 IMPLANT
GLOVE BIOGEL PI IND STRL 7.0 (GLOVE) ×1 IMPLANT
GLOVE BIOGEL PI IND STRL 7.5 (GLOVE) ×2 IMPLANT
GLOVE BIOGEL PI INDICATOR 7.0 (GLOVE) ×2
GLOVE BIOGEL PI INDICATOR 7.5 (GLOVE) ×4
GLOVE ECLIPSE 6.5 STRL STRAW (GLOVE) ×3 IMPLANT
GOWN STRL REUS W/ TWL LRG LVL3 (GOWN DISPOSABLE) ×2 IMPLANT
GOWN STRL REUS W/TWL LRG LVL3 (GOWN DISPOSABLE) ×4
IV KIT MINILOC 20X1 SAFETY (NEEDLE) IMPLANT
KIT CVR 48X5XPRB PLUP LF (MISCELLANEOUS) ×1 IMPLANT
KIT PORT POWER 8FR ISP CVUE (Port) ×3 IMPLANT
NDL SAFETY ECLIPSE 18X1.5 (NEEDLE) IMPLANT
NEEDLE HYPO 18GX1.5 SHARP (NEEDLE)
NEEDLE HYPO 25X1 1.5 SAFETY (NEEDLE) ×3 IMPLANT
PACK BASIN DAY SURGERY FS (CUSTOM PROCEDURE TRAY) ×3 IMPLANT
PENCIL SMOKE EVACUATOR (MISCELLANEOUS) ×3 IMPLANT
SLEEVE SCD COMPRESS KNEE MED (MISCELLANEOUS) ×3 IMPLANT
STRIP CLOSURE SKIN 1/2X4 (GAUZE/BANDAGES/DRESSINGS) ×2 IMPLANT
SUT MNCRL AB 4-0 PS2 18 (SUTURE) ×3 IMPLANT
SUT PROLENE 2 0 SH DA (SUTURE) ×3 IMPLANT
SUT SILK 2 0 TIES 17X18 (SUTURE)
SUT SILK 2-0 18XBRD TIE BLK (SUTURE) IMPLANT
SUT VIC AB 3-0 SH 27 (SUTURE) ×2
SUT VIC AB 3-0 SH 27X BRD (SUTURE) ×1 IMPLANT
SYR 5ML LUER SLIP (SYRINGE) ×3 IMPLANT
SYR CONTROL 10ML LL (SYRINGE) ×3 IMPLANT
TOWEL GREEN STERILE FF (TOWEL DISPOSABLE) ×3 IMPLANT
TUBE CONNECTING 20'X1/4 (TUBING)
TUBE CONNECTING 20X1/4 (TUBING) IMPLANT
YANKAUER SUCT BULB TIP NO VENT (SUCTIONS) IMPLANT

## 2018-12-23 NOTE — H&P (Signed)
Alexander LOWRIE Sr. is an 53 y.o. male.   Chief Complaint: colon cancer HPI: 10 yom s/p right colectomy for colon cancer. Now needs chemotherapy. Doing well, here for port placement  Past Medical History:  Diagnosis Date  . Arthritis    KNEES  . Asthma   . Deaf   . GERD (gastroesophageal reflux disease)    OCC  . Scrotal cyst   . Thyroid disease   . Urinary frequency    OCC    Past Surgical History:  Procedure Laterality Date  . BIOPSY  11/19/2018   Procedure: BIOPSY;  Surgeon: Thornton Park, MD;  Location: [place] [facility];  Service: Gastroenterology;;  . BLADDER SURGERY  YRS AGO  . COLONOSCOPY WITH PROPOFOL N/A 11/19/2018   Procedure: COLONOSCOPY WITH PROPOFOL;  Surgeon: Thornton Park, MD;  Location: [place];  Service: Gastroenterology;  Laterality: N/A;  . EPIDIDYMECTOMY Right 03/22/2018   Procedure: SCROTAL EXPLORATION RIGHT  PARTIAL EPIDIDYMECTOMY;  Surgeon: [name], MD;  Location: [facility];  Service: Urology;  Laterality: Right;  . HEMORRHOID SURGERY  WHEN YOUNG  . LAPAROTOMY N/A 11/19/2018   Procedure: EXPLORATORY LAPAROTOMY;  Surgeon: Rolm Bookbinder, MD;  Location: [place];  Service: General;  Laterality: N/A;  . NO PAST SURGERIES    . PARTIAL COLECTOMY Right 11/19/2018   Procedure: PARTIAL COLECTOMY;  Surgeon: Rolm Bookbinder, MD;  Location: [place];  Service: General;  Laterality: Right;  . SUBMUCOSAL TATTOO INJECTION  11/19/2018   Procedure: SUBMUCOSAL TATTOO INJECTION;  Surgeon: Thornton Park, MD;  Location: [facility];  Service: Gastroenterology;;  . [name]    Family History  Problem Relation Age of Onset  . Heart disease Father    Social History:  reports that he quit smoking about 2 years ago. His smoking use included cigarettes. He has never used smokeless tobacco. He reports current alcohol use. He reports current drug use. Drug: Marijuana.  Allergies:  Allergies  Allergen Reactions  . Cheese  Shortness Of Breath and Swelling    Swelling of the throat  . Eggs Or Egg-Derived Products Shortness Of Breath and Swelling    Swelling of the throat  . Penicillins Anaphylaxis    Has patient had a PCN reaction causing immediate rash, facial/tongue/throat swelling, SOB or lightheadedness with hypotension: Yes Has patient had a PCN reaction causing severe rash involving mucus membranes or skin necrosis: Unknown Has patient had a PCN reaction that required hospitalization: Unknown Has patient had a PCN reaction occurring within the last 10 years: Unknown If all of the above answers are "NO", then may proceed with Cephalosporin use.     Medications Prior to Admission  Medication Sig Dispense Refill  . albuterol (VENTOLIN HFA) 108 (90 Base) MCG/ACT inhaler Inhale 2 puffs into the lungs every 4 (four) hours as needed for wheezing or shortness of breath. 6.7 g 3  . fenofibrate (TRICOR) 145 MG tablet Take 1 tablet (145 mg total) by mouth daily. 90 tablet 3  . fluticasone furoate-vilanterol (BREO ELLIPTA) 200-25 MCG/INH AEPB Inhale 1 puff into the lungs daily. 60 each 11  . loratadine (CLARITIN) 10 MG tablet Take 1 tablet (10 mg total) by mouth daily. 30 tablet 11  . montelukast (SINGULAIR) 10 MG tablet Take 1 tablet (10 mg total) by mouth at bedtime. 30 tablet 11  . pantoprazole (PROTONIX) 40 MG tablet Take 1 tablet (40 mg total) by mouth daily. 30 tablet 3  . albuterol (PROVENTIL) (2.5 MG/3ML) 0.083% nebulizer solution Take  3 mLs (2.5 mg total) by nebulization every 6 (six) hours as needed for wheezing or shortness of breath. 75 mL 12  . lidocaine-prilocaine (EMLA) cream Apply 1 application topically as directed. Apply 1 hour prior to stick and cover with plastic wrap 30 g 1  . ondansetron (ZOFRAN) 8 MG tablet Take 1 tablet (8 mg total) by mouth every 8 (eight) hours as needed for nausea or vomiting. Start 72 hours after IV chemo treatment day 30 tablet 1  . prochlorperazine (COMPAZINE) 10 MG  tablet Take 1 tablet (10 mg total) by mouth every 6 (six) hours as needed for nausea. 60 tablet 1    No results found for this or any previous visit (from the past 48 hour(s)). No results found.  Review of Systems  All other systems reviewed and are negative.   Blood pressure 105/81, pulse (!) 112, temperature 98.2 F (36.8 C), temperature source Oral, resp. rate 20, height 5\' 10"  (1.778 m), weight 73.7 kg, SpO2 100 %. Physical Exam  cv rrr  Lungs clear abd wound healing  Assessment/Plan Colon cancer, need for venous access Discussed port placement, risks/benefits in office  Rolm Bookbinder, MD 12/23/2018, 3:59 PM

## 2018-12-23 NOTE — Anesthesia Postprocedure Evaluation (Signed)
Anesthesia Post Note  Patient: Alexander SIRMON Sr.  Procedure(s) Performed: INSERTION PORT-A-CATH WITH ULTRASOUND GUIDANCE (Right Chest)     Patient location during evaluation: PACU Anesthesia Type: MAC and General Level of consciousness: awake Pain management: pain level controlled Vital Signs Assessment: post-procedure vital signs reviewed and stable Respiratory status: spontaneous breathing Cardiovascular status: stable Postop Assessment: no apparent nausea or vomiting Anesthetic complications: no    Last Vitals:  Vitals:   12/23/18 1714 12/23/18 1715  BP:  (!) 112/101  Pulse: 90   Resp: (!) 21   Temp:    SpO2:  100%    Last Pain:  Vitals:   12/23/18 1705  TempSrc:   PainSc: 3    Pain Goal:                   Huston Foley

## 2018-12-23 NOTE — Anesthesia Preprocedure Evaluation (Signed)
Anesthesia Evaluation  Patient identified by MRN, date of birth, ID band Patient awake    Reviewed: Allergy & Precautions, NPO status , Patient's Chart, lab work & pertinent test results  Airway Mallampati: I       Dental no notable dental hx. (+) Teeth Intact   Pulmonary asthma , Patient abstained from smoking., former smoker,    breath sounds clear to auscultation       Cardiovascular negative cardio ROS Normal cardiovascular exam Rhythm:Regular Rate:Normal     Neuro/Psych negative neurological ROS  negative psych ROS   GI/Hepatic Neg liver ROS, GERD  Medicated and Controlled,  Endo/Other  negative endocrine ROS  Renal/GU negative Renal ROS  negative genitourinary   Musculoskeletal   Abdominal Normal abdominal exam  (+)   Peds  Hematology negative hematology ROS (+)   Anesthesia Other Findings   Reproductive/Obstetrics                             Anesthesia Physical Anesthesia Plan  ASA: II  Anesthesia Plan: MAC   Post-op Pain Management:    Induction:   PONV Risk Score and Plan: 1 and Ondansetron  Airway Management Planned: Natural Airway, Nasal Cannula and Simple Face Mask  Additional Equipment: None  Intra-op Plan:   Post-operative Plan:   Informed Consent: I have reviewed the patients History and Physical, chart, labs and discussed the procedure including the risks, benefits and alternatives for the proposed anesthesia with the patient or authorized representative who has indicated his/her understanding and acceptance.       Plan Discussed with: CRNA  Anesthesia Plan Comments:         Anesthesia Quick Evaluation

## 2018-12-23 NOTE — Anesthesia Procedure Notes (Signed)
Procedure Name: LMA Insertion Date/Time: 12/23/2018 4:23 PM Performed by: Raenette Rover, CRNA Pre-anesthesia Checklist: Patient identified, Emergency Drugs available, Suction available and Patient being monitored Patient Re-evaluated:Patient Re-evaluated prior to induction Oxygen Delivery Method: Circle system utilized Preoxygenation: Pre-oxygenation with 100% oxygen Induction Type: IV induction LMA: LMA inserted LMA Size: 4.0 Number of attempts: 1 Placement Confirmation: positive ETCO2 and breath sounds checked- equal and bilateral Tube secured with: Tape Dental Injury: Teeth and Oropharynx as per pre-operative assessment

## 2018-12-23 NOTE — Transfer of Care (Signed)
Immediate Anesthesia Transfer of Care Note  Patient: Alexander MIRKIN Sr.  Procedure(s) Performed: INSERTION PORT-A-CATH WITH ULTRASOUND GUIDANCE (Right Chest)  Patient Location: PACU  Anesthesia Type:General  Level of Consciousness: awake, alert , oriented, drowsy and patient cooperative  Airway & Oxygen Therapy: Patient Spontanous Breathing  Post-op Assessment: Report given to RN and Post -op Vital signs reviewed and stable  Post vital signs: Reviewed and stable  Last Vitals:  Vitals Value Taken Time  BP 134/95 12/23/18 1704  Temp    Pulse 92 12/23/18 1707  Resp 15 12/23/18 1707  SpO2 100 % 12/23/18 1707  Vitals shown include unvalidated device data.  Last Pain:  Vitals:   12/23/18 1433  TempSrc: Oral  PainSc: 0-No pain         Complications: No apparent anesthesia complications

## 2018-12-23 NOTE — Op Note (Signed)
Preoperative diagnosis:Stage IIIB colon cancer Postoperative diagnosis: saa Procedure: Right ij port placement with US guidance Surgeon: Dr Serita Grammes EBL: minimal Anesthesia: general  Complications none Drains none Specimens:none Sponge and needle count correct times two dispo to recovery stable  Indications:53 yom with stage IIIB colon cancer s/p right colectomy. Needs venous access for chemotherapy.    Procedure:After informed consent was obtained the patient was taken to the operating room. He was given antibiotics. SCDs were placed. He was placed under general anesthesia without complication. He was prepped and draped in the standard sterile surgical fashion. A surgical timeout was then performed.  I identified the internal jugular vein on the right side with the ultrasound. I made a small nick in the skin. I accessed the internal jugular vein with the needle under ultrasound guidance. I passed the wire. The wire was confirmed to be in position with fluoroscopy.The wire was in the vein by ultrasound as well.I then infiltrated Marcaine below the clavicle on the right side. I made an incision and developed a subcutaneous pocket for the port. I then tunneled between the port site as well as the insertion site. I brought the line through this. I then placed the dilator under fluoroscopic guidance over the wire. I removedthe wire andthe dilator. I then placed the line into the sheath. The sheath was then removed. I pulled the line back to be in the distal vena cava. I then hooked this up to the port. This was placed in the pocket and sutured in place with 2-0 Prolene suture. I then closed this with 3-0 Vicryl and 4-0 Monocryl. Glue was placed. I accessed this. It withdrew blood and flushed easily. I packed it with heparin.

## 2018-12-23 NOTE — Discharge Instructions (Signed)
PORT-A-CATH: POST OP INSTRUCTIONS  Always review your discharge instruction sheet given to you by the facility where your surgery was performed.   1. A prescription for pain medication may be given to you upon discharge. Take your pain medication as prescribed, if needed. If narcotic pain medicine is not needed, then you make take acetaminophen (Tylenol) or ibuprofen (Advil) as needed.  *You had 1000 MG of Tylenol at 2:50 PM 2. Take your usually prescribed medications unless otherwise directed. 3. If you need a refill on your pain medication, please contact our office. All narcotic pain medicine now requires a paper prescription.  Phoned in and fax refills are no longer allowed by law.  Prescriptions will not be filled after 5 pm or on weekends.  4. You should follow a light diet for the remainder of the day after your procedure. 5. Most patients will experience some mild swelling and/or bruising in the area of the incision. It may take several days to resolve. 6. It is common to experience some constipation if taking pain medication after surgery. Increasing fluid intake and taking a stool softener (such as Colace) will usually help or prevent this problem from occurring. A mild laxative (Milk of Magnesia or Miralax) should be taken according to package directions if there are no bowel movements after 48 hours.  7. Unless discharge instructions indicate otherwise, you may remove your bandages 48 hours after surgery, and you may shower at that time. You may have steri-strips (small white skin tapes) in place directly over the incision.  These strips should be left on the skin for 7-10 days.  If your surgeon used Dermabond (skin glue) on the incision, you may shower in 24 hours.  The glue will flake off over the next 2-3 weeks.  8. If your port is left accessed at the end of surgery (needle left in port), the dressing cannot get wet and should only by changed by a healthcare professional. When the  port is no longer accessed (when the needle has been removed), follow step 7.   9. ACTIVITIES:  Limit activity involving your arms for the next 72 hours. Do no strenuous exercise or activity for 1 week. You may drive when you are no longer taking prescription pain medication, you can comfortably wear a seatbelt, and you can maneuver your car. 10.You may need to see your doctor in the office for a follow-up appointment.  Please       check with your doctor.  11.When you receive a new Port-a-Cath, you will get a product guide and        ID card.  Please keep them in case you need them.  WHEN TO CALL YOUR DOCTOR [phone]): 1. Fever over 101.0 2. Chills 3. Continued bleeding from incision 4. Increased redness and tenderness at the site 5. Shortness of breath, difficulty breathing   The clinic staff is available to answer your questions during regular business hours. Please dont hesitate to call and ask to speak to one of the nurses or medical assistants for clinical concerns. If you have a medical emergency, go to the nearest emergency room or call 911.  A surgeon from [facility] is always on call at the hospital.     For further information, please visit [email or web address]     Post Anesthesia Home Care Instructions  Activity: Get plenty of rest for the remainder of the day. A responsible individual must stay with you for 24 hours following the procedure.  For the next 24 hours, DO NOT: -Drive a car -Paediatric nurse -Drink alcoholic beverages -Take any medication unless instructed by your physician -Make any legal decisions or sign important papers.  Meals: Start with liquid foods such as gelatin or soup. Progress to regular foods as tolerated. Avoid greasy, spicy, heavy foods. If nausea and/or vomiting occur, drink only clear liquids until the nausea and/or vomiting subsides. Call your physician if vomiting continues.  Special  Instructions/Symptoms: Your throat may feel dry or sore from the anesthesia or the breathing tube placed in your throat during surgery. If this causes discomfort, gargle with warm salt water. The discomfort should disappear within 24 hours.  If you had a scopolamine patch placed behind your ear for the management of post- operative nausea and/or vomiting:  1. The medication in the patch is effective for 72 hours, after which it should be removed.  Wrap patch in a tissue and discard in the trash. Wash hands thoroughly with soap and water. 2. You may remove the patch earlier than 72 hours if you experience unpleasant side effects which may include dry mouth, dizziness or visual disturbances. 3. Avoid touching the patch. Wash your hands with soap and water after contact with the patch.

## 2018-12-23 NOTE — Interval H&P Note (Signed)
History and Physical Interval Note:  12/23/2018 4:00 PM  Alexander Pelt Sr.  has presented today for surgery, with the diagnosis of COLON CANCER.  The various methods of treatment have been discussed with the patient and family. After consideration of risks, benefits and other options for treatment, the patient has consented to  Procedure(s): INSERTION PORT-A-CATH WITH ULTRASOUND GUIDANCE (N/A) as a surgical intervention.  The patient's history has been reviewed, patient examined, no change in status, stable for surgery.  I have reviewed the patient's chart and labs.  Questions were answered to the patient's satisfaction.     Rolm Bookbinder

## 2018-12-24 ENCOUNTER — Encounter: Payer: Self-pay | Admitting: *Deleted

## 2018-12-26 ENCOUNTER — Other Ambulatory Visit: Payer: Self-pay | Admitting: Oncology

## 2018-12-28 ENCOUNTER — Inpatient Hospital Stay: Payer: Medicare Other

## 2018-12-28 ENCOUNTER — Encounter: Payer: Self-pay | Admitting: Oncology

## 2018-12-28 ENCOUNTER — Encounter: Payer: Self-pay | Admitting: Nurse Practitioner

## 2018-12-28 ENCOUNTER — Other Ambulatory Visit: Payer: Self-pay

## 2018-12-28 ENCOUNTER — Inpatient Hospital Stay (HOSPITAL_BASED_OUTPATIENT_CLINIC_OR_DEPARTMENT_OTHER): Payer: Medicare Other | Admitting: Nurse Practitioner

## 2018-12-28 VITALS — BP 129/75 | HR 91 | Temp 98.3°F | Resp 16 | Ht 70.0 in | Wt 158.5 lb

## 2018-12-28 DIAGNOSIS — Z5111 Encounter for antineoplastic chemotherapy: Secondary | ICD-10-CM | POA: Diagnosis not present

## 2018-12-28 DIAGNOSIS — C189 Malignant neoplasm of colon, unspecified: Secondary | ICD-10-CM

## 2018-12-28 DIAGNOSIS — D701 Agranulocytosis secondary to cancer chemotherapy: Secondary | ICD-10-CM | POA: Diagnosis not present

## 2018-12-28 DIAGNOSIS — Z95828 Presence of other vascular implants and grafts: Secondary | ICD-10-CM

## 2018-12-28 DIAGNOSIS — T451X5A Adverse effect of antineoplastic and immunosuppressive drugs, initial encounter: Secondary | ICD-10-CM | POA: Diagnosis not present

## 2018-12-28 DIAGNOSIS — C182 Malignant neoplasm of ascending colon: Secondary | ICD-10-CM | POA: Diagnosis not present

## 2018-12-28 LAB — CMP (CANCER CENTER ONLY)
ALT: 13 U/L (ref 0–44)
AST: 17 U/L (ref 15–41)
Albumin: 4.1 g/dL (ref 3.5–5.0)
Alkaline Phosphatase: 57 U/L (ref 38–126)
Anion gap: 8 (ref 5–15)
BUN: 8 mg/dL (ref 6–20)
CO2: 26 mmol/L (ref 22–32)
Calcium: 8.9 mg/dL (ref 8.9–10.3)
Chloride: 106 mmol/L (ref 98–111)
Creatinine: 1.04 mg/dL (ref 0.61–1.24)
GFR, Est AFR Am: >60 mL/min (ref >60)
GFR, Estimated: >60 mL/min (ref >60)
Glucose, Bld: 109 mg/dL — ABNORMAL HIGH (ref 70–99)
Potassium: 3.6 mmol/L (ref 3.5–5.1)
Sodium: 140 mmol/L (ref 135–145)
Total Bilirubin: 0.2 mg/dL — ABNORMAL LOW (ref 0.3–1.2)
Total Protein: 7.1 g/dL (ref 6.5–8.1)

## 2018-12-28 LAB — CBC WITH DIFFERENTIAL (CANCER CENTER ONLY)
Abs Immature Granulocytes: 0.02 10*3/uL (ref 0.00–0.07)
Basophils Absolute: 0 10*3/uL (ref 0.0–0.1)
Basophils Relative: 1 %
Eosinophils Absolute: 0 10*3/uL (ref 0.0–0.5)
Eosinophils Relative: 1 %
HCT: 36.8 % — ABNORMAL LOW (ref 39.0–52.0)
Hemoglobin: 11.4 g/dL — ABNORMAL LOW (ref 13.0–17.0)
Immature Granulocytes: 1 %
Lymphocytes Relative: 16 %
Lymphs Abs: 0.7 10*3/uL (ref 0.7–4.0)
MCH: 25.3 pg — ABNORMAL LOW (ref 26.0–34.0)
MCHC: 31 g/dL (ref 30.0–36.0)
MCV: 81.6 fL (ref 80.0–100.0)
Monocytes Absolute: 0.8 10*3/uL (ref 0.1–1.0)
Monocytes Relative: 18 %
Neutro Abs: 2.7 10*3/uL (ref 1.7–7.7)
Neutrophils Relative %: 63 %
Platelet Count: 158 10*3/uL (ref 150–400)
RBC: 4.51 MIL/uL (ref 4.22–5.81)
RDW: 15.6 % — ABNORMAL HIGH (ref 11.5–15.5)
WBC Count: 4.3 10*3/uL (ref 4.0–10.5)
nRBC: 0 % (ref 0.0–0.2)

## 2018-12-28 MED ORDER — DEXAMETHASONE SODIUM PHOSPHATE 10 MG/ML IJ SOLN
INTRAMUSCULAR | Status: AC
  Filled 2018-12-28: qty 1

## 2018-12-28 MED ORDER — PALONOSETRON HCL INJECTION 0.25 MG/5ML
INTRAVENOUS | Status: AC
  Filled 2018-12-28: qty 5

## 2018-12-28 MED ORDER — OXALIPLATIN CHEMO INJECTION 100 MG/20ML
85.0000 mg/m2 | Freq: Once | INTRAVENOUS | Status: AC
  Administered 2018-12-28: 14:00:00 165 mg via INTRAVENOUS
  Filled 2018-12-28: qty 33

## 2018-12-28 MED ORDER — DEXTROSE 5 % IV SOLN
Freq: Once | INTRAVENOUS | Status: AC
  Filled 2018-12-28: qty 250

## 2018-12-28 MED ORDER — PALONOSETRON HCL INJECTION 0.25 MG/5ML
0.2500 mg | Freq: Once | INTRAVENOUS | Status: AC
  Administered 2018-12-28: 0.25 mg via INTRAVENOUS

## 2018-12-28 MED ORDER — FLUOROURACIL CHEMO INJECTION 2.5 GM/50ML
400.0000 mg/m2 | Freq: Once | INTRAVENOUS | Status: AC
  Administered 2018-12-28: 750 mg via INTRAVENOUS
  Filled 2018-12-28: qty 15

## 2018-12-28 MED ORDER — LEUCOVORIN CALCIUM INJECTION 350 MG
400.0000 mg/m2 | Freq: Once | INTRAVENOUS | Status: AC
  Administered 2018-12-28: 768 mg via INTRAVENOUS
  Filled 2018-12-28: qty 38.4

## 2018-12-28 MED ORDER — SODIUM CHLORIDE 0.9% FLUSH
10.0000 mL | INTRAVENOUS | Status: DC | PRN
  Administered 2018-12-28: 10 mL via INTRAVENOUS
  Filled 2018-12-28: qty 10

## 2018-12-28 MED ORDER — SODIUM CHLORIDE 0.9 % IV SOLN
2400.0000 mg/m2 | INTRAVENOUS | Status: DC
  Administered 2018-12-28: 4600 mg via INTRAVENOUS
  Filled 2018-12-28: qty 92

## 2018-12-28 MED ORDER — DEXAMETHASONE SODIUM PHOSPHATE 10 MG/ML IJ SOLN
10.0000 mg | Freq: Once | INTRAMUSCULAR | Status: AC
  Administered 2018-12-28: 10 mg via INTRAVENOUS

## 2018-12-28 NOTE — Progress Notes (Addendum)
  Alexander Reeves   Diagnosis: Colon cancer  INTERVAL HISTORY:   Alexander Reeves returns as scheduled.  He is accompanied by a sign language interpreter.  He feels well.  He reports a good appetite.  He notes some weight loss since his last visit.  No nausea or vomiting.  No abdominal pain.  Abdominal wound has nearly healed.  He had some crampy pain at the left foot/toes yesterday.  None today.  No leg swelling.  Objective:  Vital signs in last 24 hours:  Blood pressure 129/75, pulse 91, temperature 98.3 F (36.8 C), temperature source Temporal, resp. rate 16, height '5\' 10"'$  (1.778 m), weight 158 lb 8 oz (71.9 kg), SpO2 98 %.    GI: Abdomen soft and nontender.  No hepatomegaly.  Midline incision is nearly healed.  There is a small area of healthy appearing granulation tissue just inferior to the umbilicus. Vascular: No leg edema. Musculoskeletal: Nontender over the left foot. Neuro: Alert and oriented. Skin: Palms without erythema. Port-A-Cath without erythema.  Lab Results:  Lab Results  Component Value Date   WBC 4.3 12/28/2018   HGB 11.4 (L) 12/28/2018   HCT 36.8 (L) 12/28/2018   MCV 81.6 12/28/2018   PLT 158 12/28/2018   NEUTROABS 2.7 12/28/2018    Imaging:  No results found.  Medications: I have reviewed the patient's current medications.  Assessment/Plan: 1. Moderately differentiated adenocarcinoma ascending colon, stage IIIb (pT3pN1), status post a right colectomy 11/19/2018 ? Lymphovascular and perineural invasion present, 2/14 lymph nodes positive, tumor deposits present ? Positive radial margin, no loss of mismatch repair protein expression ? Colonoscopy 11/19/2018-completely obstructing mid ascending colon mass, could not be passed with endoscope, biopsy confirmed invasive adenocarcinoma ? CT abdomen/pelvis 11/18/2018-wall thickening at the mid and distal ascending colon with mild distention of the proximal ascending colon and  cecum ? CTs 10/17/2018--no acute findings, no chest lymphadenopathy, lungs clear ? Cycle 1 FOLFOX 12/28/2018  2. Deaf 3. Right epididymal cyst removal 03/22/2018 4. Asthma 5. Port-A-Cath placement, [name], 12/23/2018   Disposition: Alexander Reeves appears stable.  He is scheduled to receive cycle 1 FOLFOX today.  We again reviewed potential toxicities.  He agrees to proceed.  We reviewed the labs from today, adequate for treatment.  He will return for lab, follow-up, cycle 2 FOLFOX in 2 weeks.  He will contact the office in the interim with any problems.  Patient seen with Dr. Benay Spice.    Ned Card ANP/GNP-BC   12/28/2018  12:35 PM This was a shared visit with Ned Card.  Alexander Reeves will begin adjuvant FOLFOX chemotherapy today.  He has attended a chemotherapy teaching class.  His case will be presented at the GI tumor conference tomorrow.  Julieanne Manson

## 2018-12-28 NOTE — Patient Instructions (Signed)
[facility] Discharge Instructions for Patients Receiving Chemotherapy  Today you received the following chemotherapy agents: Oxaliplatin, Leucovorin, and Fluorouracil.  To help prevent nausea and vomiting after your treatment, we encourage you to take your nausea medication as directed.  If you develop nausea and vomiting that is not controlled by your nausea medication, call the clinic.   BELOW ARE SYMPTOMS THAT SHOULD BE REPORTED IMMEDIATELY:  *FEVER GREATER THAN 100.5 F  *CHILLS WITH OR WITHOUT FEVER  NAUSEA AND VOMITING THAT IS NOT CONTROLLED WITH YOUR NAUSEA MEDICATION  *UNUSUAL SHORTNESS OF BREATH  *UNUSUAL BRUISING OR BLEEDING  TENDERNESS IN MOUTH AND THROAT WITH OR WITHOUT PRESENCE OF ULCERS  *URINARY PROBLEMS  *BOWEL PROBLEMS  UNUSUAL RASH Items with * indicate a potential emergency and should be followed up as soon as possible.  Feel free to call the clinic should you have any questions or concerns. The clinic phone number is [phone].  Please show the [place] at check-in to the Emergency Department and triage nurse.  Oxaliplatin Injection What is this medicine? OXALIPLATIN (ox AL i PLA tin) is a chemotherapy drug. It targets fast dividing cells, like cancer cells, and causes these cells to die. This medicine is used to treat cancers of the colon and rectum, and many other cancers. This medicine may be used for other purposes; ask your health care provider or pharmacist if you have questions. COMMON BRAND NAME(S): Eloxatin What should I tell my health care provider before I take this medicine? They need to know if you have any of these conditions:  kidney disease  an unusual or allergic reaction to oxaliplatin, other chemotherapy, other medicines, foods, dyes, or preservatives  pregnant or trying to get pregnant  breast-feeding How should I use this medicine? This drug is given as an infusion into a vein. It is administered in  a hospital or clinic by a specially trained health care professional. Talk to your pediatrician regarding the use of this medicine in children. Special care may be needed. Overdosage: If you think you have taken too much of this medicine contact a poison control center or emergency room at once. NOTE: This medicine is only for you. Do not share this medicine with others. What if I miss a dose? It is important not to miss a dose. Call your doctor or health care professional if you are unable to keep an appointment. What may interact with this medicine?  medicines to increase blood counts like filgrastim, pegfilgrastim, sargramostim  probenecid  some antibiotics like amikacin, gentamicin, neomycin, polymyxin B, streptomycin, tobramycin  zalcitabine Talk to your doctor or health care professional before taking any of these medicines:  acetaminophen  aspirin  ibuprofen  ketoprofen  naproxen This list may not describe all possible interactions. Give your health care provider a list of all the medicines, herbs, non-prescription drugs, or dietary supplements you use. Also tell them if you smoke, drink alcohol, or use illegal drugs. Some items may interact with your medicine. What should I watch for while using this medicine? Your condition will be monitored carefully while you are receiving this medicine. You will need important blood work done while you are taking this medicine. This medicine can make you more sensitive to cold. Do not drink cold drinks or use ice. Cover exposed skin before coming in contact with cold temperatures or cold objects. When out in cold weather wear warm clothing and cover your mouth and nose to warm the air that goes into  your lungs. Tell your doctor if you get sensitive to the cold. This drug may make you feel generally unwell. This is not uncommon, as chemotherapy can affect healthy cells as well as cancer cells. Report any side effects. Continue your course of  treatment even though you feel ill unless your doctor tells you to stop. In some cases, you may be given additional medicines to help with side effects. Follow all directions for their use. Call your doctor or health care professional for advice if you get a fever, chills or sore throat, or other symptoms of a cold or flu. Do not treat yourself. This drug decreases your body's ability to fight infections. Try to avoid being around people who are sick. This medicine may increase your risk to bruise or bleed. Call your doctor or health care professional if you notice any unusual bleeding. Be careful brushing and flossing your teeth or using a toothpick because you may get an infection or bleed more easily. If you have any dental work done, tell your dentist you are receiving this medicine. Avoid taking products that contain aspirin, acetaminophen, ibuprofen, naproxen, or ketoprofen unless instructed by your doctor. These medicines may hide a fever. Do not become pregnant while taking this medicine. Women should inform their doctor if they wish to become pregnant or think they might be pregnant. There is a potential for serious side effects to an unborn child. Talk to your health care professional or pharmacist for more information. Do not breast-feed an infant while taking this medicine. Call your doctor or health care professional if you get diarrhea. Do not treat yourself. What side effects may I notice from receiving this medicine? Side effects that you should report to your doctor or health care professional as soon as possible:  allergic reactions like skin rash, itching or hives, swelling of the face, lips, or tongue  low blood counts - This drug may decrease the number of white blood cells, red blood cells and platelets. You may be at increased risk for infections and bleeding.  signs of infection - fever or chills, cough, sore throat, pain or difficulty passing urine  signs of decreased  platelets or bleeding - bruising, pinpoint red spots on the skin, black, tarry stools, nosebleeds  signs of decreased red blood cells - unusually weak or tired, fainting spells, lightheadedness  breathing problems  chest pain, pressure  cough  diarrhea  jaw tightness  mouth sores  nausea and vomiting  pain, swelling, redness or irritation at the injection site  pain, tingling, numbness in the hands or feet  problems with balance, talking, walking  redness, blistering, peeling or loosening of the skin, including inside the mouth  trouble passing urine or change in the amount of urine Side effects that usually do not require medical attention (report to your doctor or health care professional if they continue or are bothersome):  changes in vision  constipation  hair loss  loss of appetite  metallic taste in the mouth or changes in taste  stomach pain This list may not describe all possible side effects. Call your doctor for medical advice about side effects. You may report side effects to FDA at 1-800-FDA-1088. Where should I keep my medicine? This drug is given in a hospital or clinic and will not be stored at home. NOTE: This sheet is a summary. It may not cover all possible information. If you have questions about this medicine, talk to your doctor, pharmacist, or health care provider.  2020 Elsevier/Gold Standard (2007-07-27 17:22:47)   Leucovorin injection What is this medicine? LEUCOVORIN (loo koe VOR in) is used to prevent or treat the harmful effects of some medicines. This medicine is used to treat anemia caused by a low amount of folic acid in the body. It is also used with 5-fluorouracil (5-FU) to treat colon cancer. This medicine may be used for other purposes; ask your health care provider or pharmacist if you have questions. What should I tell my health care provider before I take this medicine? They need to know if you have any of these  conditions:  anemia from low levels of vitamin B-12 in the blood  an unusual or allergic reaction to leucovorin, folic acid, other medicines, foods, dyes, or preservatives  pregnant or trying to get pregnant  breast-feeding How should I use this medicine? This medicine is for injection into a muscle or into a vein. It is given by a health care professional in a hospital or clinic setting. Talk to your pediatrician regarding the use of this medicine in children. Special care may be needed. Overdosage: If you think you have taken too much of this medicine contact a poison control center or emergency room at once. NOTE: This medicine is only for you. Do not share this medicine with others. What if I miss a dose? This does not apply. What may interact with this medicine?  capecitabine  fluorouracil  phenobarbital  phenytoin  primidone  trimethoprim-sulfamethoxazole This list may not describe all possible interactions. Give your health care provider a list of all the medicines, herbs, non-prescription drugs, or dietary supplements you use. Also tell them if you smoke, drink alcohol, or use illegal drugs. Some items may interact with your medicine. What should I watch for while using this medicine? Your condition will be monitored carefully while you are receiving this medicine. This medicine may increase the side effects of 5-fluorouracil, 5-FU. Tell your doctor or health care professional if you have diarrhea or mouth sores that do not get better or that get worse. What side effects may I notice from receiving this medicine? Side effects that you should report to your doctor or health care professional as soon as possible:  allergic reactions like skin rash, itching or hives, swelling of the face, lips, or tongue  breathing problems  fever, infection  mouth sores  unusual bleeding or bruising  unusually weak or tired Side effects that usually do not require medical  attention (report to your doctor or health care professional if they continue or are bothersome):  constipation or diarrhea  loss of appetite  nausea, vomiting This list may not describe all possible side effects. Call your doctor for medical advice about side effects. You may report side effects to FDA at 1-800-FDA-1088. Where should I keep my medicine? This drug is given in a hospital or clinic and will not be stored at home. NOTE: This sheet is a summary. It may not cover all possible information. If you have questions about this medicine, talk to your doctor, pharmacist, or health care provider.  2020 Elsevier/Gold Standard (2007-07-06 16:50:29)  Fluorouracil, 5-FU injection What is this medicine? FLUOROURACIL, 5-FU (flure oh YOOR a sil) is a chemotherapy drug. It slows the growth of cancer cells. This medicine is used to treat many types of cancer like breast cancer, colon or rectal cancer, pancreatic cancer, and stomach cancer. This medicine may be used for other purposes; ask your health care provider or pharmacist if you have questions.  COMMON BRAND NAME(S): Adrucil What should I tell my health care provider before I take this medicine? They need to know if you have any of these conditions:  blood disorders  dihydropyrimidine dehydrogenase (DPD) deficiency  infection (especially a virus infection such as chickenpox, cold sores, or herpes)  kidney disease  liver disease  malnourished, poor nutrition  recent or ongoing radiation therapy  an unusual or allergic reaction to fluorouracil, other chemotherapy, other medicines, foods, dyes, or preservatives  pregnant or trying to get pregnant  breast-feeding How should I use this medicine? This drug is given as an infusion or injection into a vein. It is administered in a hospital or clinic by a specially trained health care professional. Talk to your pediatrician regarding the use of this medicine in children. Special care  may be needed. Overdosage: If you think you have taken too much of this medicine contact a poison control center or emergency room at once. NOTE: This medicine is only for you. Do not share this medicine with others. What if I miss a dose? It is important not to miss your dose. Call your doctor or health care professional if you are unable to keep an appointment. What may interact with this medicine?  allopurinol  cimetidine  dapsone  digoxin  hydroxyurea  leucovorin  levamisole  medicines for seizures like ethotoin, fosphenytoin, phenytoin  medicines to increase blood counts like filgrastim, pegfilgrastim, sargramostim  medicines that treat or prevent blood clots like warfarin, enoxaparin, and dalteparin  methotrexate  metronidazole  pyrimethamine  some other chemotherapy drugs like busulfan, cisplatin, estramustine, vinblastine  trimethoprim  trimetrexate  vaccines Talk to your doctor or health care professional before taking any of these medicines:  acetaminophen  aspirin  ibuprofen  ketoprofen  naproxen This list may not describe all possible interactions. Give your health care provider a list of all the medicines, herbs, non-prescription drugs, or dietary supplements you use. Also tell them if you smoke, drink alcohol, or use illegal drugs. Some items may interact with your medicine. What should I watch for while using this medicine? Visit your doctor for checks on your progress. This drug may make you feel generally unwell. This is not uncommon, as chemotherapy can affect healthy cells as well as cancer cells. Report any side effects. Continue your course of treatment even though you feel ill unless your doctor tells you to stop. In some cases, you may be given additional medicines to help with side effects. Follow all directions for their use. Call your doctor or health care professional for advice if you get a fever, chills or sore throat, or other  symptoms of a cold or flu. Do not treat yourself. This drug decreases your body's ability to fight infections. Try to avoid being around people who are sick. This medicine may increase your risk to bruise or bleed. Call your doctor or health care professional if you notice any unusual bleeding. Be careful brushing and flossing your teeth or using a toothpick because you may get an infection or bleed more easily. If you have any dental work done, tell your dentist you are receiving this medicine. Avoid taking products that contain aspirin, acetaminophen, ibuprofen, naproxen, or ketoprofen unless instructed by your doctor. These medicines may hide a fever. Do not become pregnant while taking this medicine. Women should inform their doctor if they wish to become pregnant or think they might be pregnant. There is a potential for serious side effects to an unborn child. Talk to your  health care professional or pharmacist for more information. Do not breast-feed an infant while taking this medicine. Men should inform their doctor if they wish to father a child. This medicine may lower sperm counts. Do not treat diarrhea with over the counter products. Contact your doctor if you have diarrhea that lasts more than 2 days or if it is severe and watery. This medicine can make you more sensitive to the sun. Keep out of the sun. If you cannot avoid being in the sun, wear protective clothing and use sunscreen. Do not use sun lamps or tanning beds/booths. What side effects may I notice from receiving this medicine? Side effects that you should report to your doctor or health care professional as soon as possible:  allergic reactions like skin rash, itching or hives, swelling of the face, lips, or tongue  low blood counts - this medicine may decrease the number of white blood cells, red blood cells and platelets. You may be at increased risk for infections and bleeding.  signs of infection - fever or chills, cough,  sore throat, pain or difficulty passing urine  signs of decreased platelets or bleeding - bruising, pinpoint red spots on the skin, black, tarry stools, blood in the urine  signs of decreased red blood cells - unusually weak or tired, fainting spells, lightheadedness  breathing problems  changes in vision  chest pain  mouth sores  nausea and vomiting  pain, swelling, redness at site where injected  pain, tingling, numbness in the hands or feet  redness, swelling, or sores on hands or feet  stomach pain  unusual bleeding Side effects that usually do not require medical attention (report to your doctor or health care professional if they continue or are bothersome):  changes in finger or toe nails  diarrhea  dry or itchy skin  hair loss  headache  loss of appetite  sensitivity of eyes to the light  stomach upset  unusually teary eyes This list may not describe all possible side effects. Call your doctor for medical advice about side effects. You may report side effects to FDA at 1-800-FDA-1088. Where should I keep my medicine? This drug is given in a hospital or clinic and will not be stored at home. NOTE: This sheet is a summary. It may not cover all possible information. If you have questions about this medicine, talk to your doctor, pharmacist, or health care provider.  2020 Elsevier/Gold Standard (2007-05-05 13:53:16)

## 2018-12-28 NOTE — Progress Notes (Signed)
Met with patient/interpreter to introduce myself as Arboriculturist and to offer available resources.  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 Advised what is needed to apply.  Gave him my card if interested in applying and for any additional financial questions or concerns.  Patient expressed transportation concern. Sending message to transportation coordinator to reach out.

## 2018-12-28 NOTE — Patient Instructions (Signed)

## 2018-12-29 ENCOUNTER — Telehealth: Payer: Self-pay | Admitting: *Deleted

## 2018-12-30 ENCOUNTER — Inpatient Hospital Stay: Payer: Medicare Other

## 2018-12-30 ENCOUNTER — Other Ambulatory Visit: Payer: Self-pay

## 2018-12-30 ENCOUNTER — Telehealth: Payer: Self-pay | Admitting: Oncology

## 2018-12-30 VITALS — BP 110/73 | HR 84 | Temp 98.3°F | Resp 18

## 2018-12-30 DIAGNOSIS — Z5111 Encounter for antineoplastic chemotherapy: Secondary | ICD-10-CM | POA: Diagnosis not present

## 2018-12-30 DIAGNOSIS — C189 Malignant neoplasm of colon, unspecified: Secondary | ICD-10-CM

## 2018-12-30 MED ORDER — SODIUM CHLORIDE 0.9% FLUSH
10.0000 mL | INTRAVENOUS | Status: DC | PRN
  Administered 2018-12-30: 10 mL
  Filled 2018-12-30: qty 10

## 2018-12-30 MED ORDER — HEPARIN SOD (PORK) LOCK FLUSH 100 UNIT/ML IV SOLN
500.0000 [IU] | Freq: Once | INTRAVENOUS | Status: AC | PRN
  Administered 2018-12-30: 500 [IU]
  Filled 2018-12-30: qty 5

## 2018-12-30 NOTE — Telephone Encounter (Signed)
Scheduled per los. Called and left msg. Mailed printout  °

## 2019-01-03 MED FILL — PANTOPRAZOLE SOD DR 40 MG T: 40 | 30 days supply | Qty: 30 | Fill #1

## 2019-01-09 ENCOUNTER — Other Ambulatory Visit: Payer: Self-pay | Admitting: Oncology

## 2019-01-10 MED FILL — ALBUTEROL SULFATE HFA 108 (: 108 (90 BAS | 25 days supply | Qty: 18 | Fill #1

## 2019-01-11 ENCOUNTER — Inpatient Hospital Stay: Payer: Medicare Other

## 2019-01-11 ENCOUNTER — Ambulatory Visit: Payer: Medicare Other | Admitting: Nutrition

## 2019-01-11 ENCOUNTER — Inpatient Hospital Stay: Payer: Medicare Other | Admitting: Nutrition

## 2019-01-11 ENCOUNTER — Inpatient Hospital Stay (HOSPITAL_BASED_OUTPATIENT_CLINIC_OR_DEPARTMENT_OTHER): Payer: Medicare Other | Admitting: Oncology

## 2019-01-11 ENCOUNTER — Other Ambulatory Visit: Payer: Self-pay | Admitting: Oncology

## 2019-01-11 ENCOUNTER — Telehealth: Payer: Self-pay

## 2019-01-11 ENCOUNTER — Other Ambulatory Visit: Payer: Self-pay

## 2019-01-11 VITALS — BP 134/82 | HR 83 | Temp 98.9°F | Resp 18 | Ht 70.0 in | Wt 159.6 lb

## 2019-01-11 DIAGNOSIS — C189 Malignant neoplasm of colon, unspecified: Secondary | ICD-10-CM

## 2019-01-11 DIAGNOSIS — E876 Hypokalemia: Secondary | ICD-10-CM

## 2019-01-11 DIAGNOSIS — Z5111 Encounter for antineoplastic chemotherapy: Secondary | ICD-10-CM | POA: Diagnosis not present

## 2019-01-11 DIAGNOSIS — Z95828 Presence of other vascular implants and grafts: Secondary | ICD-10-CM | POA: Insufficient documentation

## 2019-01-11 LAB — CMP (CANCER CENTER ONLY)
ALT: 18 U/L (ref 0–44)
AST: 19 U/L (ref 15–41)
Albumin: 3.7 g/dL (ref 3.5–5.0)
Alkaline Phosphatase: 67 U/L (ref 38–126)
Anion gap: 10 (ref 5–15)
BUN: 8 mg/dL (ref 6–20)
CO2: 26 mmol/L (ref 22–32)
Calcium: 7.9 mg/dL — ABNORMAL LOW (ref 8.9–10.3)
Chloride: 107 mmol/L (ref 98–111)
Creatinine: 0.9 mg/dL (ref 0.61–1.24)
GFR, Est AFR Am: >60 mL/min (ref >60)
GFR, Estimated: >60 mL/min (ref >60)
Glucose, Bld: 85 mg/dL (ref 70–99)
Potassium: 3 mmol/L — CL (ref 3.5–5.1)
Sodium: 143 mmol/L (ref 135–145)
Total Bilirubin: 0.3 mg/dL (ref 0.3–1.2)
Total Protein: 6.5 g/dL (ref 6.5–8.1)

## 2019-01-11 LAB — CBC WITH DIFFERENTIAL (CANCER CENTER ONLY)
Abs Immature Granulocytes: 0 10*3/uL (ref 0.00–0.07)
Basophils Absolute: 0 10*3/uL (ref 0.0–0.1)
Basophils Relative: 0 %
Eosinophils Absolute: 0.1 10*3/uL (ref 0.0–0.5)
Eosinophils Relative: 4 %
HCT: 31.4 % — ABNORMAL LOW (ref 39.0–52.0)
Hemoglobin: 9.9 g/dL — ABNORMAL LOW (ref 13.0–17.0)
Immature Granulocytes: 0 %
Lymphocytes Relative: 45 %
Lymphs Abs: 1.1 10*3/uL (ref 0.7–4.0)
MCH: 25.6 pg — ABNORMAL LOW (ref 26.0–34.0)
MCHC: 31.5 g/dL (ref 30.0–36.0)
MCV: 81.1 fL (ref 80.0–100.0)
Monocytes Absolute: 0.3 10*3/uL (ref 0.1–1.0)
Monocytes Relative: 13 %
Neutro Abs: 0.9 10*3/uL — ABNORMAL LOW (ref 1.7–7.7)
Neutrophils Relative %: 38 %
Platelet Count: 165 10*3/uL (ref 150–400)
RBC: 3.87 MIL/uL — ABNORMAL LOW (ref 4.22–5.81)
RDW: 14.6 % (ref 11.5–15.5)
WBC Count: 2.4 10*3/uL — ABNORMAL LOW (ref 4.0–10.5)
nRBC: 0 % (ref 0.0–0.2)

## 2019-01-11 MED ORDER — DEXTROSE 5 % IV SOLN
Freq: Once | INTRAVENOUS | Status: AC
  Filled 2019-01-11: qty 250

## 2019-01-11 MED ORDER — PALONOSETRON HCL INJECTION 0.25 MG/5ML
0.2500 mg | Freq: Once | INTRAVENOUS | Status: AC
  Administered 2019-01-11: 0.25 mg via INTRAVENOUS

## 2019-01-11 MED ORDER — SODIUM CHLORIDE 0.9% FLUSH
10.0000 mL | INTRAVENOUS | Status: DC | PRN
  Administered 2019-01-11: 10 mL
  Filled 2019-01-11: qty 10

## 2019-01-11 MED ORDER — FLUOROURACIL CHEMO INJECTION 2.5 GM/50ML
400.0000 mg/m2 | Freq: Once | INTRAVENOUS | Status: AC
  Administered 2019-01-11: 750 mg via INTRAVENOUS
  Filled 2019-01-11: qty 15

## 2019-01-11 MED ORDER — POTASSIUM CHLORIDE CRYS ER 20 MEQ PO TBCR: 20.0000 meq | EXTENDED_RELEASE_TABLET | Freq: Every day | ORAL | 1 refills | Status: DC

## 2019-01-11 MED ORDER — PALONOSETRON HCL INJECTION 0.25 MG/5ML
INTRAVENOUS | Status: AC
  Filled 2019-01-11: qty 5

## 2019-01-11 MED ORDER — POTASSIUM CHLORIDE CRYS ER 20 MEQ PO TBCR
20.0000 meq | EXTENDED_RELEASE_TABLET | Freq: Once | ORAL | Status: AC
  Administered 2019-01-11: 10:00:00 20 meq via ORAL

## 2019-01-11 MED ORDER — DEXAMETHASONE SODIUM PHOSPHATE 10 MG/ML IJ SOLN
10.0000 mg | Freq: Once | INTRAMUSCULAR | Status: AC
  Administered 2019-01-11: 10:00:00 10 mg via INTRAVENOUS

## 2019-01-11 MED ORDER — SODIUM CHLORIDE 0.9 % IV SOLN
2400.0000 mg/m2 | INTRAVENOUS | Status: DC
  Administered 2019-01-11: 13:00:00 4550 mg via INTRAVENOUS
  Filled 2019-01-11: qty 91

## 2019-01-11 MED ORDER — OXALIPLATIN CHEMO INJECTION 100 MG/20ML
85.0000 mg/m2 | Freq: Once | INTRAVENOUS | Status: AC
  Administered 2019-01-11: 160 mg via INTRAVENOUS
  Filled 2019-01-11: qty 32

## 2019-01-11 MED ORDER — LEUCOVORIN CALCIUM INJECTION 350 MG
400.0000 mg/m2 | Freq: Once | INTRAVENOUS | Status: AC
  Administered 2019-01-11: 756 mg via INTRAVENOUS
  Filled 2019-01-11: qty 37.8

## 2019-01-11 MED ORDER — DEXAMETHASONE SODIUM PHOSPHATE 10 MG/ML IJ SOLN
INTRAMUSCULAR | Status: AC
  Filled 2019-01-11: qty 1

## 2019-01-11 MED ORDER — POTASSIUM CHLORIDE CRYS ER 20 MEQ PO TBCR
EXTENDED_RELEASE_TABLET | ORAL | Status: AC
  Filled 2019-01-11: qty 1

## 2019-01-11 MED FILL — POTASSIUM CL ER 20 MEQ TAB: 20 | 30 days supply | Qty: 30 | Fill #0

## 2019-01-11 NOTE — Progress Notes (Signed)
Per Dr. Benay Spice: OK to treat w/ANC 0.9 today. Adding Udenyca to day 3. OK per managed care. Also OK to treat w/K+ 3.0. Will give 20 meq po today in treatment area and take daily at home as well.

## 2019-01-11 NOTE — Progress Notes (Signed)
  [facility] OFFICE PROGRESS NOTE   Diagnosis: Colon cancer  INTERVAL HISTORY:   Mr. Kuzniar completed cycle 1 FOLFOX 12/28/2018.  He had mild nausea following chemotherapy.  No vomiting.  No diarrhea.  He had small sores follow-up.  He had cold sensitivity following chemotherapy.  This has resolved.  No neuropathy symptoms at present.  He is here today with a sign language interpreter.  Objective:  Vital signs in last 24 hours:  Blood pressure 134/82, pulse 83, temperature 98.9 F (37.2 C), temperature source Temporal, resp. rate 18, height '5\' 10"'$  (1.778 m), weight 159 lb 9.6 oz (72.4 kg), SpO2 100 %.   Limited physical examination secondary to distancing with the Covid pandemic HEENT: No thrush or ulcers GI: No hepatomegaly, healed midline incision, nontender Vascular: No leg edema  Skin: Palms without erythema  Portacath/PICC-without erythema  Lab Results:  Lab Results  Component Value Date   WBC 4.3 12/28/2018   HGB 11.4 (L) 12/28/2018   HCT 36.8 (L) 12/28/2018   MCV 81.6 12/28/2018   PLT 158 12/28/2018   NEUTROABS 2.7 12/28/2018    CMP  Lab Results  Component Value Date   NA 140 12/28/2018   K 3.6 12/28/2018   CL 106 12/28/2018   CO2 26 12/28/2018   GLUCOSE 109 (H) 12/28/2018   BUN 8 12/28/2018   CREATININE 1.04 12/28/2018   CALCIUM 8.9 12/28/2018   PROT 7.1 12/28/2018   ALBUMIN 4.1 12/28/2018   AST 17 12/28/2018   ALT 13 12/28/2018   ALKPHOS 57 12/28/2018   BILITOT 0.2 (L) 12/28/2018   GFRNONAA >60 12/28/2018   GFRAA >60 12/28/2018    Lab Results  Component Value Date   CEA1 2.7 11/18/2018     Medications: I have reviewed the patient's current medications.   Assessment/Plan: 1. Moderately differentiated adenocarcinoma ascending colon, stage IIIb (pT3pN1), status post a right colectomy 11/19/2018 ? Lymphovascular and perineural invasion present, 2/14 lymph nodes positive, tumor deposits present ? Positive radial margin, no  loss of mismatch repair protein expression ? Colonoscopy 11/19/2018-completely obstructing mid ascending colon mass, could not be passed with endoscope, biopsy confirmed invasive adenocarcinoma ? CT abdomen/pelvis 11/18/2018-wall thickening at the mid and distal ascending colon with mild distention of the proximal ascending colon and cecum ? CTs 10/17/2018--no acute findings, no chest lymphadenopathy, lungs clear ? Cycle 1 FOLFOX 12/28/2018 ? Cycle 2 FOLFOX 01/11/2019, Udenyca added  2. Deaf 3. Right epididymal cyst removal 03/22/2018 4. Asthma 5. Port-A-Cath placement, [name], 12/23/2018 6. Neutropenia secondary to chemotherapy-Udenyca added for cycle 2 FOLFOX     Disposition: Mr. Franchini tolerated the first cycle of FOLFOX well.  He has mild neutropenia secondary to chemotherapy.  We discussed delaying treatment versus adding G-CSF support.  He understands the risk for infection with further chemotherapy.  We reviewed potential toxicities associated with G-CSF.  He agrees to proceed with chemotherapy today.  Ellen Henri will be added.  He knows to contact us for a fever or other symptoms of infection.  Mr. Madariaga will return for a nadir CBC next week.  He will be scheduled for an office visit and cycle 3 FOLFOX on 01/25/2019.  Betsy Coder, MD  01/11/2019  8:48 AM

## 2019-01-11 NOTE — Patient Instructions (Signed)
[facility] Discharge Instructions for Patients Receiving Chemotherapy  Today you received the following chemotherapy agents: Oxaliplatin, Leucovorin, and Fluorouracil.  To help prevent nausea and vomiting after your treatment, we encourage you to take your nausea medication as directed.  If you develop nausea and vomiting that is not controlled by your nausea medication, call the clinic.   BELOW ARE SYMPTOMS THAT SHOULD BE REPORTED IMMEDIATELY:  *FEVER GREATER THAN 100.5 F  *CHILLS WITH OR WITHOUT FEVER  NAUSEA AND VOMITING THAT IS NOT CONTROLLED WITH YOUR NAUSEA MEDICATION  *UNUSUAL SHORTNESS OF BREATH  *UNUSUAL BRUISING OR BLEEDING  TENDERNESS IN MOUTH AND THROAT WITH OR WITHOUT PRESENCE OF ULCERS  *URINARY PROBLEMS  *BOWEL PROBLEMS  UNUSUAL RASH Items with * indicate a potential emergency and should be followed up as soon as possible.  Feel free to call the clinic should you have any questions or concerns. The clinic phone number is [phone].  Please show the [facility] at check-in to the Emergency Department and triage nurse.  Oxaliplatin Injection What is this medicine? OXALIPLATIN (ox AL i PLA tin) is a chemotherapy drug. It targets fast dividing cells, like cancer cells, and causes these cells to die. This medicine is used to treat cancers of the colon and rectum, and many other cancers. This medicine may be used for other purposes; ask your health care provider or pharmacist if you have questions. COMMON BRAND NAME(S): Eloxatin What should I tell my health care provider before I take this medicine? They need to know if you have any of these conditions:  kidney disease  an unusual or allergic reaction to oxaliplatin, other chemotherapy, other medicines, foods, dyes, or preservatives  pregnant or trying to get pregnant  breast-feeding How should I use this medicine? This drug is given as an infusion into a vein. It is administered in  a hospital or clinic by a specially trained health care professional. Talk to your pediatrician regarding the use of this medicine in children. Special care may be needed. Overdosage: If you think you have taken too much of this medicine contact a poison control center or emergency room at once. NOTE: This medicine is only for you. Do not share this medicine with others. What if I miss a dose? It is important not to miss a dose. Call your doctor or health care professional if you are unable to keep an appointment. What may interact with this medicine?  medicines to increase blood counts like filgrastim, pegfilgrastim, sargramostim  probenecid  some antibiotics like amikacin, gentamicin, neomycin, polymyxin B, streptomycin, tobramycin  zalcitabine Talk to your doctor or health care professional before taking any of these medicines:  acetaminophen  aspirin  ibuprofen  ketoprofen  naproxen This list may not describe all possible interactions. Give your health care provider a list of all the medicines, herbs, non-prescription drugs, or dietary supplements you use. Also tell them if you smoke, drink alcohol, or use illegal drugs. Some items may interact with your medicine. What should I watch for while using this medicine? Your condition will be monitored carefully while you are receiving this medicine. You will need important blood work done while you are taking this medicine. This medicine can make you more sensitive to cold. Do not drink cold drinks or use ice. Cover exposed skin before coming in contact with cold temperatures or cold objects. When out in cold weather wear warm clothing and cover your mouth and nose to warm the air that goes into  your lungs. Tell your doctor if you get sensitive to the cold. This drug may make you feel generally unwell. This is not uncommon, as chemotherapy can affect healthy cells as well as cancer cells. Report any side effects. Continue your course of  treatment even though you feel ill unless your doctor tells you to stop. In some cases, you may be given additional medicines to help with side effects. Follow all directions for their use. Call your doctor or health care professional for advice if you get a fever, chills or sore throat, or other symptoms of a cold or flu. Do not treat yourself. This drug decreases your body's ability to fight infections. Try to avoid being around people who are sick. This medicine may increase your risk to bruise or bleed. Call your doctor or health care professional if you notice any unusual bleeding. Be careful brushing and flossing your teeth or using a toothpick because you may get an infection or bleed more easily. If you have any dental work done, tell your dentist you are receiving this medicine. Avoid taking products that contain aspirin, acetaminophen, ibuprofen, naproxen, or ketoprofen unless instructed by your doctor. These medicines may hide a fever. Do not become pregnant while taking this medicine. Women should inform their doctor if they wish to become pregnant or think they might be pregnant. There is a potential for serious side effects to an unborn child. Talk to your health care professional or pharmacist for more information. Do not breast-feed an infant while taking this medicine. Call your doctor or health care professional if you get diarrhea. Do not treat yourself. What side effects may I notice from receiving this medicine? Side effects that you should report to your doctor or health care professional as soon as possible:  allergic reactions like skin rash, itching or hives, swelling of the face, lips, or tongue  low blood counts - This drug may decrease the number of white blood cells, red blood cells and platelets. You may be at increased risk for infections and bleeding.  signs of infection - fever or chills, cough, sore throat, pain or difficulty passing urine  signs of decreased  platelets or bleeding - bruising, pinpoint red spots on the skin, black, tarry stools, nosebleeds  signs of decreased red blood cells - unusually weak or tired, fainting spells, lightheadedness  breathing problems  chest pain, pressure  cough  diarrhea  jaw tightness  mouth sores  nausea and vomiting  pain, swelling, redness or irritation at the injection site  pain, tingling, numbness in the hands or feet  problems with balance, talking, walking  redness, blistering, peeling or loosening of the skin, including inside the mouth  trouble passing urine or change in the amount of urine Side effects that usually do not require medical attention (report to your doctor or health care professional if they continue or are bothersome):  changes in vision  constipation  hair loss  loss of appetite  metallic taste in the mouth or changes in taste  stomach pain This list may not describe all possible side effects. Call your doctor for medical advice about side effects. You may report side effects to FDA at 1-800-FDA-1088. Where should I keep my medicine? This drug is given in a hospital or clinic and will not be stored at home. NOTE: This sheet is a summary. It may not cover all possible information. If you have questions about this medicine, talk to your doctor, pharmacist, or health care provider.  2020 Elsevier/Gold Standard (2007-07-27 17:22:47)   Leucovorin injection What is this medicine? LEUCOVORIN (loo koe VOR in) is used to prevent or treat the harmful effects of some medicines. This medicine is used to treat anemia caused by a low amount of folic acid in the body. It is also used with 5-fluorouracil (5-FU) to treat colon cancer. This medicine may be used for other purposes; ask your health care provider or pharmacist if you have questions. What should I tell my health care provider before I take this medicine? They need to know if you have any of these  conditions:  anemia from low levels of vitamin B-12 in the blood  an unusual or allergic reaction to leucovorin, folic acid, other medicines, foods, dyes, or preservatives  pregnant or trying to get pregnant  breast-feeding How should I use this medicine? This medicine is for injection into a muscle or into a vein. It is given by a health care professional in a hospital or clinic setting. Talk to your pediatrician regarding the use of this medicine in children. Special care may be needed. Overdosage: If you think you have taken too much of this medicine contact a poison control center or emergency room at once. NOTE: This medicine is only for you. Do not share this medicine with others. What if I miss a dose? This does not apply. What may interact with this medicine?  capecitabine  fluorouracil  phenobarbital  phenytoin  primidone  trimethoprim-sulfamethoxazole This list may not describe all possible interactions. Give your health care provider a list of all the medicines, herbs, non-prescription drugs, or dietary supplements you use. Also tell them if you smoke, drink alcohol, or use illegal drugs. Some items may interact with your medicine. What should I watch for while using this medicine? Your condition will be monitored carefully while you are receiving this medicine. This medicine may increase the side effects of 5-fluorouracil, 5-FU. Tell your doctor or health care professional if you have diarrhea or mouth sores that do not get better or that get worse. What side effects may I notice from receiving this medicine? Side effects that you should report to your doctor or health care professional as soon as possible:  allergic reactions like skin rash, itching or hives, swelling of the face, lips, or tongue  breathing problems  fever, infection  mouth sores  unusual bleeding or bruising  unusually weak or tired Side effects that usually do not require medical  attention (report to your doctor or health care professional if they continue or are bothersome):  constipation or diarrhea  loss of appetite  nausea, vomiting This list may not describe all possible side effects. Call your doctor for medical advice about side effects. You may report side effects to FDA at 1-800-FDA-1088. Where should I keep my medicine? This drug is given in a hospital or clinic and will not be stored at home. NOTE: This sheet is a summary. It may not cover all possible information. If you have questions about this medicine, talk to your doctor, pharmacist, or health care provider.  2020 Elsevier/Gold Standard (2007-07-06 16:50:29)  Fluorouracil, 5-FU injection What is this medicine? FLUOROURACIL, 5-FU (flure oh YOOR a sil) is a chemotherapy drug. It slows the growth of cancer cells. This medicine is used to treat many types of cancer like breast cancer, colon or rectal cancer, pancreatic cancer, and stomach cancer. This medicine may be used for other purposes; ask your health care provider or pharmacist if you have questions.  COMMON BRAND NAME(S): Adrucil What should I tell my health care provider before I take this medicine? They need to know if you have any of these conditions:  blood disorders  dihydropyrimidine dehydrogenase (DPD) deficiency  infection (especially a virus infection such as chickenpox, cold sores, or herpes)  kidney disease  liver disease  malnourished, poor nutrition  recent or ongoing radiation therapy  an unusual or allergic reaction to fluorouracil, other chemotherapy, other medicines, foods, dyes, or preservatives  pregnant or trying to get pregnant  breast-feeding How should I use this medicine? This drug is given as an infusion or injection into a vein. It is administered in a hospital or clinic by a specially trained health care professional. Talk to your pediatrician regarding the use of this medicine in children. Special care  may be needed. Overdosage: If you think you have taken too much of this medicine contact a poison control center or emergency room at once. NOTE: This medicine is only for you. Do not share this medicine with others. What if I miss a dose? It is important not to miss your dose. Call your doctor or health care professional if you are unable to keep an appointment. What may interact with this medicine?  allopurinol  cimetidine  dapsone  digoxin  hydroxyurea  leucovorin  levamisole  medicines for seizures like ethotoin, fosphenytoin, phenytoin  medicines to increase blood counts like filgrastim, pegfilgrastim, sargramostim  medicines that treat or prevent blood clots like warfarin, enoxaparin, and dalteparin  methotrexate  metronidazole  pyrimethamine  some other chemotherapy drugs like busulfan, cisplatin, estramustine, vinblastine  trimethoprim  trimetrexate  vaccines Talk to your doctor or health care professional before taking any of these medicines:  acetaminophen  aspirin  ibuprofen  ketoprofen  naproxen This list may not describe all possible interactions. Give your health care provider a list of all the medicines, herbs, non-prescription drugs, or dietary supplements you use. Also tell them if you smoke, drink alcohol, or use illegal drugs. Some items may interact with your medicine. What should I watch for while using this medicine? Visit your doctor for checks on your progress. This drug may make you feel generally unwell. This is not uncommon, as chemotherapy can affect healthy cells as well as cancer cells. Report any side effects. Continue your course of treatment even though you feel ill unless your doctor tells you to stop. In some cases, you may be given additional medicines to help with side effects. Follow all directions for their use. Call your doctor or health care professional for advice if you get a fever, chills or sore throat, or other  symptoms of a cold or flu. Do not treat yourself. This drug decreases your body's ability to fight infections. Try to avoid being around people who are sick. This medicine may increase your risk to bruise or bleed. Call your doctor or health care professional if you notice any unusual bleeding. Be careful brushing and flossing your teeth or using a toothpick because you may get an infection or bleed more easily. If you have any dental work done, tell your dentist you are receiving this medicine. Avoid taking products that contain aspirin, acetaminophen, ibuprofen, naproxen, or ketoprofen unless instructed by your doctor. These medicines may hide a fever. Do not become pregnant while taking this medicine. Women should inform their doctor if they wish to become pregnant or think they might be pregnant. There is a potential for serious side effects to an unborn child. Talk to your  health care professional or pharmacist for more information. Do not breast-feed an infant while taking this medicine. Men should inform their doctor if they wish to father a child. This medicine may lower sperm counts. Do not treat diarrhea with over the counter products. Contact your doctor if you have diarrhea that lasts more than 2 days or if it is severe and watery. This medicine can make you more sensitive to the sun. Keep out of the sun. If you cannot avoid being in the sun, wear protective clothing and use sunscreen. Do not use sun lamps or tanning beds/booths. What side effects may I notice from receiving this medicine? Side effects that you should report to your doctor or health care professional as soon as possible:  allergic reactions like skin rash, itching or hives, swelling of the face, lips, or tongue  low blood counts - this medicine may decrease the number of white blood cells, red blood cells and platelets. You may be at increased risk for infections and bleeding.  signs of infection - fever or chills, cough,  sore throat, pain or difficulty passing urine  signs of decreased platelets or bleeding - bruising, pinpoint red spots on the skin, black, tarry stools, blood in the urine  signs of decreased red blood cells - unusually weak or tired, fainting spells, lightheadedness  breathing problems  changes in vision  chest pain  mouth sores  nausea and vomiting  pain, swelling, redness at site where injected  pain, tingling, numbness in the hands or feet  redness, swelling, or sores on hands or feet  stomach pain  unusual bleeding Side effects that usually do not require medical attention (report to your doctor or health care professional if they continue or are bothersome):  changes in finger or toe nails  diarrhea  dry or itchy skin  hair loss  headache  loss of appetite  sensitivity of eyes to the light  stomach upset  unusually teary eyes This list may not describe all possible side effects. Call your doctor for medical advice about side effects. You may report side effects to FDA at 1-800-FDA-1088. Where should I keep my medicine? This drug is given in a hospital or clinic and will not be stored at home. NOTE: This sheet is a summary. It may not cover all possible information. If you have questions about this medicine, talk to your doctor, pharmacist, or health care provider.  2020 Elsevier/Gold Standard (2007-05-05 13:53:16)

## 2019-01-11 NOTE — Telephone Encounter (Signed)
CRITICAL VALUE STICKER  CRITICAL VALUE:  Potassium 3.0  RECEIVER (on-site recipient of call): Lenox Ponds LPN  DATE & TIME NOTIFIED: 01/11/19 9:28  MESSENGER (representative from lab): Rhonda   MD NOTIFIED: Dr. Benay Spice  TIME OF NOTIFICATION: 9:30  RESPONSE: first dose 20 meq po potassium to be given today in infusion. And oral prescription for 20 meq sent to the pharmacy.

## 2019-01-11 NOTE — Progress Notes (Signed)
53 year old male diagnosed with colon cancer.  He is a patient of Dr. Julieanne Manson.  Past medical history includes asthma, GERD, thyroid disease.  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  Medications include Zofran, Protonix, and Compazine.  Labs include glucose 109.  Height: 5 feet 10 inches. Weight: 159.6 pounds. Usual body weight: 165 pounds per patient. BMI: 22.9.  Nutrition consult done with sign language interpreter and patient. Patient reports good appetite.  He denies nausea and vomiting. He admits to some minor weight loss. He is not using oral nutrition supplements but drinks soy milk. 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  Interpreter was not able to understand if it was an allergy or intolerance. Patient appears to be eating regular meals daily and does not have any issues chewing and swallowing.  Nutrition diagnosis: Unintended weight loss related to new diagnosis of colon cancer and associated treatments as evidenced by 5.4 pound weight loss from usual body weight.  Intervention: Educated patient on the importance of consuming increased calories and protein to minimize further weight loss. Reviewed high-protein foods and provided examples of meals and snacks. Encouraged weight stability. Provided fact sheets.  Questions were answered.  Teach back method used.  Contact information was provided.  Monitoring, evaluation, goals: Patient will tolerate adequate calories and protein to minimize weight loss.  Next visit: Patient will contact me for questions or concerns.  **Disclaimer: This note was dictated with voice recognition software. Similar sounding words can inadvertently be transcribed and this note may contain transcription errors which may not have been corrected upon publication of note.**

## 2019-01-13 ENCOUNTER — Telehealth: Payer: Self-pay | Admitting: Urology

## 2019-01-13 ENCOUNTER — Other Ambulatory Visit: Payer: Self-pay

## 2019-01-13 ENCOUNTER — Inpatient Hospital Stay: Payer: Medicare Other

## 2019-01-13 VITALS — BP 123/77 | HR 95 | Temp 98.7°F | Resp 16

## 2019-01-13 DIAGNOSIS — Z95828 Presence of other vascular implants and grafts: Secondary | ICD-10-CM

## 2019-01-13 DIAGNOSIS — Z5111 Encounter for antineoplastic chemotherapy: Secondary | ICD-10-CM | POA: Diagnosis not present

## 2019-01-13 DIAGNOSIS — C189 Malignant neoplasm of colon, unspecified: Secondary | ICD-10-CM

## 2019-01-13 MED ORDER — SODIUM CHLORIDE 0.9% FLUSH
10.0000 mL | INTRAVENOUS | Status: DC | PRN
  Administered 2019-01-13: 10 mL
  Filled 2019-01-13: qty 10

## 2019-01-13 MED ORDER — PEGFILGRASTIM-CBQV 6 MG/0.6ML ~~LOC~~ SOSY
PREFILLED_SYRINGE | SUBCUTANEOUS | Status: AC
  Filled 2019-01-13: qty 0.6

## 2019-01-13 MED ORDER — PEGFILGRASTIM-CBQV 6 MG/0.6ML ~~LOC~~ SOSY
6.0000 mg | PREFILLED_SYRINGE | Freq: Once | SUBCUTANEOUS | Status: AC
  Administered 2019-01-13: 6 mg via SUBCUTANEOUS

## 2019-01-13 MED ORDER — HEPARIN SOD (PORK) LOCK FLUSH 100 UNIT/ML IV SOLN
500.0000 [IU] | Freq: Once | INTRAVENOUS | Status: AC | PRN
  Administered 2019-01-13: 500 [IU]
  Filled 2019-01-13: qty 5

## 2019-01-13 NOTE — Telephone Encounter (Signed)
Scheduled per los. Called, no answer. Mailed printout  

## 2019-01-13 NOTE — Patient Instructions (Signed)
Pegfilgrastim injection What is this medicine? PEGFILGRASTIM (PEG fil gra stim) is a long-acting granulocyte colony-stimulating factor that stimulates the growth of neutrophils, a type of white blood cell important in the body's fight against infection. It is used to reduce the incidence of fever and infection in patients with certain types of cancer who are receiving chemotherapy that affects the bone marrow, and to increase survival after being exposed to high doses of radiation. This medicine may be used for other purposes; ask your health care provider or pharmacist if you have questions. COMMON BRAND NAME(S): Fulphila, Neulasta, UDENYCA, Ziextenzo What should I tell my health care provider before I take this medicine? They need to know if you have any of these conditions:  kidney disease  latex allergy  ongoing radiation therapy  sickle cell disease  skin reactions to acrylic adhesives (On-Body Injector only)  an unusual or allergic reaction to pegfilgrastim, filgrastim, other medicines, foods, dyes, or preservatives  pregnant or trying to get pregnant  breast-feeding How should I use this medicine? This medicine is for injection under the skin. If you get this medicine at home, you will be taught how to prepare and give the pre-filled syringe or how to use the On-body Injector. Refer to the patient Instructions for Use for detailed instructions. Use exactly as directed. Tell your healthcare provider immediately if you suspect that the On-body Injector may not have performed as intended or if you suspect the use of the On-body Injector resulted in a missed or partial dose. It is important that you put your used needles and syringes in a special sharps container. Do not put them in a trash can. If you do not have a sharps container, call your pharmacist or healthcare provider to get one. Talk to your pediatrician regarding the use of this medicine in children. While this drug may be  prescribed for selected conditions, precautions do apply. Overdosage: If you think you have taken too much of this medicine contact a poison control center or emergency room at once. NOTE: This medicine is only for you. Do not share this medicine with others. What if I miss a dose? It is important not to miss your dose. Call your doctor or health care professional if you miss your dose. If you miss a dose due to an On-body Injector failure or leakage, a new dose should be administered as soon as possible using a single prefilled syringe for manual use. What may interact with this medicine? Interactions have not been studied. Give your health care provider a list of all the medicines, herbs, non-prescription drugs, or dietary supplements you use. Also tell them if you smoke, drink alcohol, or use illegal drugs. Some items may interact with your medicine. This list may not describe all possible interactions. Give your health care provider a list of all the medicines, herbs, non-prescription drugs, or dietary supplements you use. Also tell them if you smoke, drink alcohol, or use illegal drugs. Some items may interact with your medicine. What should I watch for while using this medicine? You may need blood work done while you are taking this medicine. If you are going to need a MRI, CT scan, or other procedure, tell your doctor that you are using this medicine (On-Body Injector only). What side effects may I notice from receiving this medicine? Side effects that you should report to your doctor or health care professional as soon as possible:  allergic reactions like skin rash, itching or hives, swelling of the   face, lips, or tongue  back pain  dizziness  fever  pain, redness, or irritation at site where injected  pinpoint red spots on the skin  red or dark-brown urine  shortness of breath or breathing problems  stomach or side pain, or pain at the  shoulder  swelling  tiredness  trouble passing urine or change in the amount of urine Side effects that usually do not require medical attention (report to your doctor or health care professional if they continue or are bothersome):  bone pain  muscle pain This list may not describe all possible side effects. Call your doctor for medical advice about side effects. You may report side effects to FDA at 1-800-FDA-1088. Where should I keep my medicine? Keep out of the reach of children. If you are using this medicine at home, you will be instructed on how to store it. Throw away any unused medicine after the expiration date on the label. NOTE: This sheet is a summary. It may not cover all possible information. If you have questions about this medicine, talk to your doctor, pharmacist, or health care provider.  2020 Elsevier/Gold Standard (2017-04-06 16:57:08)   Central Line, Adult A central line is a thin, flexible tube (catheter) that is put in your vein. It can be used to:  Give you medicine.  Give you food and nutrients. Follow these instructions at home: Caring for the tube   Follow instructions from your doctor about: ? Flushing the tube with saline solution. ? Cleaning the tube and the area around it.  Only flush with clean (sterile) supplies. The supplies should be from your doctor, a pharmacy, or another place that your doctor recommends.  Before you flush the tube or clean the area around the tube: ? Wash your hands with soap and water. If you cannot use soap and water, use hand sanitizer. ? Clean the central line hub with rubbing alcohol. Caring for your skin  Keep the area where the tube was put in clean and dry.  Every day, and when changing the bandage, check the skin around the central line for: ? Redness, swelling, or pain. ? Fluid or blood. ? Warmth. ? Pus. ? A bad smell. General instructions  Keep the tube clamped, unless it is being used.  Keep  your supplies in a clean, dry location.  If you or someone else accidentally pulls on the tube, make sure: ? The bandage (dressing) is okay. ? There is no bleeding. ? The tube has not been pulled out.  Do not use scissors or sharp objects near the tube.  Do not swim or let the tube soak in a tub.  Ask your doctor what activities are safe for you. Your doctor may tell you not to lift anything or move your arm too much.  Take over-the-counter and prescription medicines only as told by your doctor.  Change bandages as told by your doctor.  Keep your bandage dry. If a bandage gets wet, have it changed right away.  Keep all follow-up visits as told by your doctor. This is important. Throwing away supplies  Throw away any syringes in a trash (disposal) container that is only for sharp items (sharps container). You can buy a sharps container from a pharmacy, or you can make one by using an empty hard plastic bottle with a cover.  Place any used bandages or infusion bags into a plastic bag. Throw that bag in the trash. Contact a doctor if:  You have any   of these where the tube was put in: ? Redness, swelling, or pain. ? Fluid or blood. ? A warm feeling. ? Pus or a bad smell. Get help right away if:  You have: ? A fever. ? Chills. ? Trouble getting enough air (shortness of breath). ? Trouble breathing. ? Pain in your chest. ? Swelling in your neck, face, chest, or arm.  You are coughing.  You feel your heart beating fast or skipping beats.  You feel dizzy or you pass out (faint).  There are red lines coming from where the tube was put in.  The area where the tube was put in is bleeding and the bleeding will not stop.  Your tube is hard to flush.  You do not get a blood return from the tube.  The tube gets loose or comes out.  The tube has a hole or a tear.  The tube leaks. Summary  A central line is a thin, flexible tube (catheter) that is put in your vein. It  can be used to take blood for lab tests or to give you medicine.  Follow instructions from your doctor about flushing and cleaning the tube.  Keep the area where the tube was put in clean and dry.  Ask your doctor what activities are safe for you. This information is not intended to replace advice given to you by your health care provider. Make sure you discuss any questions you have with your health care provider. Document Revised: 04/21/2018 Document Reviewed: 01/17/2016 Elsevier Patient Education  2020 Elsevier Inc.   

## 2019-01-19 ENCOUNTER — Inpatient Hospital Stay: Payer: Medicare Other | Attending: Nurse Practitioner

## 2019-01-19 DIAGNOSIS — Z5111 Encounter for antineoplastic chemotherapy: Secondary | ICD-10-CM | POA: Insufficient documentation

## 2019-01-19 DIAGNOSIS — C182 Malignant neoplasm of ascending colon: Secondary | ICD-10-CM | POA: Insufficient documentation

## 2019-01-20 ENCOUNTER — Telehealth: Payer: Self-pay | Admitting: *Deleted

## 2019-01-20 NOTE — Telephone Encounter (Signed)
Patient had left message on 1/6 at 1615 requesting to speak w/nurse regarding back pain. Attempted to return call today--no answer. Left message to call office if he still needs to discuss his pain.

## 2019-01-20 NOTE — Telephone Encounter (Signed)
Patient called back and thru deaf interpreter explained his severe back pain for several days after his GCSF injection. Informed him it bone pain from the injection he received to stimulate his bone marrow to make WBC's. Instructed him to take claritin daily x 5 days after each injection and he can take Ibuprofen or Aleve for the pain.

## 2019-01-23 ENCOUNTER — Other Ambulatory Visit: Payer: Self-pay | Admitting: Oncology

## 2019-01-24 MED FILL — FENOFIBRATE 145 MG TABLET: 145 | 90 days supply | Qty: 90 | Fill #1

## 2019-01-25 ENCOUNTER — Inpatient Hospital Stay: Payer: Medicare Other

## 2019-01-25 ENCOUNTER — Telehealth (INDEPENDENT_AMBULATORY_CARE_PROVIDER_SITE_OTHER): Payer: Self-pay

## 2019-01-25 ENCOUNTER — Inpatient Hospital Stay (HOSPITAL_BASED_OUTPATIENT_CLINIC_OR_DEPARTMENT_OTHER): Payer: Medicare Other | Admitting: Oncology

## 2019-01-25 ENCOUNTER — Other Ambulatory Visit: Payer: Self-pay

## 2019-01-25 VITALS — BP 140/96

## 2019-01-25 VITALS — BP 159/94 | HR 94 | Temp 98.3°F | Resp 18 | Ht 70.0 in | Wt 158.5 lb

## 2019-01-25 DIAGNOSIS — C189 Malignant neoplasm of colon, unspecified: Secondary | ICD-10-CM

## 2019-01-25 DIAGNOSIS — Z95828 Presence of other vascular implants and grafts: Secondary | ICD-10-CM

## 2019-01-25 DIAGNOSIS — Z5111 Encounter for antineoplastic chemotherapy: Secondary | ICD-10-CM | POA: Diagnosis not present

## 2019-01-25 DIAGNOSIS — C182 Malignant neoplasm of ascending colon: Secondary | ICD-10-CM | POA: Diagnosis not present

## 2019-01-25 LAB — CMP (CANCER CENTER ONLY)
ALT: 17 U/L (ref 0–44)
AST: 18 U/L (ref 15–41)
Albumin: 4.2 g/dL (ref 3.5–5.0)
Alkaline Phosphatase: 105 U/L (ref 38–126)
Anion gap: 10 (ref 5–15)
BUN: 10 mg/dL (ref 6–20)
CO2: 24 mmol/L (ref 22–32)
Calcium: 8.7 mg/dL — ABNORMAL LOW (ref 8.9–10.3)
Chloride: 106 mmol/L (ref 98–111)
Creatinine: 1.03 mg/dL (ref 0.61–1.24)
GFR, Est AFR Am: >60 mL/min (ref >60)
GFR, Estimated: >60 mL/min (ref >60)
Glucose, Bld: 87 mg/dL (ref 70–99)
Potassium: 4 mmol/L (ref 3.5–5.1)
Sodium: 140 mmol/L (ref 135–145)
Total Bilirubin: 0.3 mg/dL (ref 0.3–1.2)
Total Protein: 7.3 g/dL (ref 6.5–8.1)

## 2019-01-25 LAB — CBC WITH DIFFERENTIAL (CANCER CENTER ONLY)
Abs Immature Granulocytes: 1.24 10*3/uL — ABNORMAL HIGH (ref 0.00–0.07)
Basophils Absolute: 0 10*3/uL (ref 0.0–0.1)
Basophils Relative: 0 %
Eosinophils Absolute: 0.1 10*3/uL (ref 0.0–0.5)
Eosinophils Relative: 1 %
HCT: 34.5 % — ABNORMAL LOW (ref 39.0–52.0)
Hemoglobin: 10.7 g/dL — ABNORMAL LOW (ref 13.0–17.0)
Immature Granulocytes: 10 %
Lymphocytes Relative: 15 %
Lymphs Abs: 1.9 10*3/uL (ref 0.7–4.0)
MCH: 24.9 pg — ABNORMAL LOW (ref 26.0–34.0)
MCHC: 31 g/dL (ref 30.0–36.0)
MCV: 80.2 fL (ref 80.0–100.0)
Monocytes Absolute: 0.9 10*3/uL (ref 0.1–1.0)
Monocytes Relative: 7 %
Neutro Abs: 8.4 10*3/uL — ABNORMAL HIGH (ref 1.7–7.7)
Neutrophils Relative %: 67 %
Platelet Count: 91 10*3/uL — ABNORMAL LOW (ref 150–400)
RBC: 4.3 MIL/uL (ref 4.22–5.81)
RDW: 16 % — ABNORMAL HIGH (ref 11.5–15.5)
WBC Count: 12.5 10*3/uL — ABNORMAL HIGH (ref 4.0–10.5)
nRBC: 1.1 % — ABNORMAL HIGH (ref 0.0–0.2)

## 2019-01-25 LAB — MAGNESIUM: Magnesium: 1.9 mg/dL (ref 1.7–2.4)

## 2019-01-25 MED ORDER — OXALIPLATIN CHEMO INJECTION 100 MG/20ML
65.0000 mg/m2 | Freq: Once | INTRAVENOUS | Status: AC
  Administered 2019-01-25: 125 mg via INTRAVENOUS
  Filled 2019-01-25: qty 20

## 2019-01-25 MED ORDER — LEUCOVORIN CALCIUM INJECTION 350 MG
400.0000 mg/m2 | Freq: Once | INTRAVENOUS | Status: AC
  Administered 2019-01-25: 756 mg via INTRAVENOUS
  Filled 2019-01-25: qty 37.8

## 2019-01-25 MED ORDER — FLUOROURACIL CHEMO INJECTION 2.5 GM/50ML
400.0000 mg/m2 | Freq: Once | INTRAVENOUS | Status: AC
  Administered 2019-01-25: 750 mg via INTRAVENOUS
  Filled 2019-01-25: qty 15

## 2019-01-25 MED ORDER — PALONOSETRON HCL INJECTION 0.25 MG/5ML
0.2500 mg | Freq: Once | INTRAVENOUS | Status: AC
  Administered 2019-01-25: 0.25 mg via INTRAVENOUS

## 2019-01-25 MED ORDER — DEXAMETHASONE SODIUM PHOSPHATE 10 MG/ML IJ SOLN
10.0000 mg | Freq: Once | INTRAMUSCULAR | Status: AC
  Administered 2019-01-25: 10 mg via INTRAVENOUS

## 2019-01-25 MED ORDER — DEXTROSE 5 % IV SOLN
Freq: Once | INTRAVENOUS | Status: AC
  Filled 2019-01-25: qty 250

## 2019-01-25 MED ORDER — SODIUM CHLORIDE 0.9 % IV SOLN
2400.0000 mg/m2 | INTRAVENOUS | Status: DC
  Administered 2019-01-25: 4550 mg via INTRAVENOUS
  Filled 2019-01-25: qty 91

## 2019-01-25 MED ORDER — PALONOSETRON HCL INJECTION 0.25 MG/5ML
INTRAVENOUS | Status: AC
  Filled 2019-01-25: qty 5

## 2019-01-25 MED ORDER — SODIUM CHLORIDE 0.9% FLUSH
10.0000 mL | INTRAVENOUS | Status: DC | PRN
  Administered 2019-01-25: 10 mL
  Filled 2019-01-25: qty 10

## 2019-01-25 MED ORDER — DEXAMETHASONE SODIUM PHOSPHATE 10 MG/ML IJ SOLN
INTRAMUSCULAR | Status: AC
  Filled 2019-01-25: qty 1

## 2019-01-25 NOTE — Telephone Encounter (Signed)
Patient called to make a medication refill for  albuterol (PROVENTIL) (2.5 MG/3ML) 0.083% nebulizer solution   Patient uses  [facility]   Please advice  [phone]

## 2019-01-25 NOTE — Addendum Note (Signed)
Addended by: Betsy Coder B on: 01/25/2019 10:18 AM   Modules accepted: Orders

## 2019-01-25 NOTE — Progress Notes (Signed)
  [facility] OFFICE PROGRESS NOTE   Diagnosis: Colon cancer  INTERVAL HISTORY:   Mr. Terpening returns for a scheduled visit.  He is here with a sign language interpreter.  He completed cycle 2 FOLFOX on 01/11/2019.  No nausea or vomiting.  No neuropathy symptoms.  He reports mild diarrhea. He had back pain after receiving Neulasta.  This lasted for 1-2 days.  He has noted darkening of the hands.  No pain. Objective:  Vital signs in last 24 hours:  Blood pressure (!) 159/94, pulse 94, temperature 98.3 F (36.8 C), temperature source Temporal, resp. rate 18, height '5\' 10"'$  (1.778 m), weight 158 lb 8 oz (71.9 kg), SpO2 100 %.    HEENT: Hyperpigmentation of the buccal mucosa, no thrush or ulcers GI: No hepatomegaly, nontender Vascular: No leg edema  Skin: Mild hyperpigmentation of the hands  Portacath/PICC-without erythema  Lab Results:  Lab Results  Component Value Date   WBC 12.5 (H) 01/25/2019   HGB 10.7 (L) 01/25/2019   HCT 34.5 (L) 01/25/2019   MCV 80.2 01/25/2019   PLT 91 (L) 01/25/2019   NEUTROABS PENDING 01/25/2019    CMP  Lab Results  Component Value Date   NA 140 01/25/2019   K 4.0 01/25/2019   CL 106 01/25/2019   CO2 24 01/25/2019   GLUCOSE 87 01/25/2019   BUN 10 01/25/2019   CREATININE 1.03 01/25/2019   CALCIUM 8.7 (L) 01/25/2019   PROT 7.3 01/25/2019   ALBUMIN 4.2 01/25/2019   AST 18 01/25/2019   ALT 17 01/25/2019   ALKPHOS 105 01/25/2019   BILITOT 0.3 01/25/2019   GFRNONAA >60 01/25/2019   GFRAA >60 01/25/2019    Lab Results  Component Value Date   CEA1 2.7 11/18/2018    Medications: I have reviewed the patient's current medications.   Assessment/Plan:  1. Moderately differentiated adenocarcinoma ascending colon, stage IIIb (pT3pN1), status post a right colectomy 11/19/2018 ? Lymphovascular and perineural invasion present, 2/14 lymph nodes positive, tumor deposits present ? Positive radial margin, no loss of mismatch repair  protein expression ? Colonoscopy 11/19/2018-completely obstructing mid ascending colon mass, could not be passed with endoscope, biopsy confirmed invasive adenocarcinoma ? CT abdomen/pelvis 11/18/2018-wall thickening at the mid and distal ascending colon with mild distention of the proximal ascending colon and cecum ? CTs 10/17/2018--no acute findings, no chest lymphadenopathy, lungs clear ? Cycle 1 FOLFOX 12/28/2018 ? Cycle 2 FOLFOX 01/11/2019, Udenyca added  ? Cycle 3 FOLFOX 01/25/2019  2. Deaf 3. Right epididymal cyst removal 03/22/2018 4. Asthma 5. Port-A-Cath placement, [name], 12/23/2018 6. Neutropenia secondary to chemotherapy-Udenyca added for cycle 2 FOLFOX  Disposition: Mr. Soth has completed 2 cycles of FOLFOX.  He has tolerated the chemotherapy well.  The neutrophil count is higher today.  We will hold Neulasta with this cycle of chemotherapy as he had bone pain with Neulasta following cycle 2.  He has mild thrombocytopenia.  I will dose reduce the oxaliplatin.  He will call for a fever or bleeding. Mr. Pembroke will return for an office visit and cycle 4 chemotherapy in 2 weeks.  The mouth and hand hyperpigmentation is secondary to 5-fluorouracil.  This should improve following completion of chemotherapy.  Betsy Coder, MD  01/25/2019  9:44 AM

## 2019-01-25 NOTE — Patient Instructions (Signed)
[facility] Discharge Instructions for Patients Receiving Chemotherapy  Today you received the following chemotherapy agents: oxaliplatin, leucovorin, and 5FU.  To help prevent nausea and vomiting after your treatment, we encourage you to take your nausea medication as directed.   If you develop nausea and vomiting that is not controlled by your nausea medication, call the clinic.   BELOW ARE SYMPTOMS THAT SHOULD BE REPORTED IMMEDIATELY:  *FEVER GREATER THAN 100.5 F  *CHILLS WITH OR WITHOUT FEVER  NAUSEA AND VOMITING THAT IS NOT CONTROLLED WITH YOUR NAUSEA MEDICATION  *UNUSUAL SHORTNESS OF BREATH  *UNUSUAL BRUISING OR BLEEDING  TENDERNESS IN MOUTH AND THROAT WITH OR WITHOUT PRESENCE OF ULCERS  *URINARY PROBLEMS  *BOWEL PROBLEMS  UNUSUAL RASH Items with * indicate a potential emergency and should be followed up as soon as possible.  Feel free to call the clinic should you have any questions or concerns. The clinic phone number is [phone].  Please show the [facility] at check-in to the Emergency Department and triage nurse.

## 2019-01-25 NOTE — Progress Notes (Signed)
OK to treat with today's platelet count of 91K, and BP 140/96 per Dr Benay Spice

## 2019-01-26 ENCOUNTER — Telehealth: Payer: Self-pay | Admitting: Oncology

## 2019-01-26 MED FILL — PANTOPRAZOLE SOD DR 40 MG T: 40 | 30 days supply | Qty: 30 | Fill #2

## 2019-01-26 NOTE — Telephone Encounter (Signed)
Scheduled per los. Called and left msg. Mailed printout  °

## 2019-01-27 ENCOUNTER — Other Ambulatory Visit: Payer: Self-pay

## 2019-01-27 ENCOUNTER — Inpatient Hospital Stay: Payer: Medicare Other

## 2019-01-27 VITALS — BP 144/92 | HR 88 | Temp 98.1°F | Resp 18

## 2019-01-27 DIAGNOSIS — C182 Malignant neoplasm of ascending colon: Secondary | ICD-10-CM | POA: Diagnosis not present

## 2019-01-27 DIAGNOSIS — C189 Malignant neoplasm of colon, unspecified: Secondary | ICD-10-CM

## 2019-01-27 DIAGNOSIS — Z5111 Encounter for antineoplastic chemotherapy: Secondary | ICD-10-CM | POA: Diagnosis not present

## 2019-01-27 MED ORDER — SODIUM CHLORIDE 0.9% FLUSH
10.0000 mL | INTRAVENOUS | Status: DC | PRN
  Administered 2019-01-27: 10 mL
  Filled 2019-01-27: qty 10

## 2019-01-27 MED ORDER — HEPARIN SOD (PORK) LOCK FLUSH 100 UNIT/ML IV SOLN
500.0000 [IU] | Freq: Once | INTRAVENOUS | Status: AC | PRN
  Administered 2019-01-27: 500 [IU]
  Filled 2019-01-27: qty 5

## 2019-01-31 NOTE — Telephone Encounter (Signed)
FWD to PCP

## 2019-02-01 ENCOUNTER — Other Ambulatory Visit (INDEPENDENT_AMBULATORY_CARE_PROVIDER_SITE_OTHER): Payer: Self-pay | Admitting: Primary Care

## 2019-02-01 MED ORDER — ALBUTEROL SULFATE (2.5 MG/3ML) 0.083% IN NEBU: 2.5000 mg | INHALATION_SOLUTION | Freq: Four times a day (QID) | RESPIRATORY_TRACT | 12 refills | Status: DC | PRN

## 2019-02-01 NOTE — Telephone Encounter (Signed)
Medication refilled

## 2019-02-06 ENCOUNTER — Other Ambulatory Visit: Payer: Self-pay | Admitting: Oncology

## 2019-02-08 ENCOUNTER — Inpatient Hospital Stay (HOSPITAL_BASED_OUTPATIENT_CLINIC_OR_DEPARTMENT_OTHER): Payer: Medicare Other | Admitting: Medical

## 2019-02-08 ENCOUNTER — Encounter (HOSPITAL_COMMUNITY): Payer: Self-pay | Admitting: Oncology

## 2019-02-08 ENCOUNTER — Other Ambulatory Visit: Payer: Self-pay

## 2019-02-08 ENCOUNTER — Inpatient Hospital Stay: Payer: Medicare Other

## 2019-02-08 ENCOUNTER — Ambulatory Visit (HOSPITAL_COMMUNITY)
Admission: RE | Admit: 2019-02-08 | Discharge: 2019-02-08 | Disposition: A | Discharge status: Home / Self Care | Payer: Medicare Other | Source: Ambulatory Visit | Attending: Nurse Practitioner | Admitting: Nurse Practitioner

## 2019-02-08 ENCOUNTER — Other Ambulatory Visit: Payer: Self-pay | Admitting: Medical

## 2019-02-08 ENCOUNTER — Other Ambulatory Visit: Payer: Self-pay | Admitting: Nurse Practitioner

## 2019-02-08 ENCOUNTER — Inpatient Hospital Stay: Payer: Medicare Other | Admitting: Nurse Practitioner

## 2019-02-08 ENCOUNTER — Inpatient Hospital Stay (HOSPITAL_COMMUNITY)
Admission: AD | Admit: 2019-02-08 | Discharge: 2019-02-11 | DRG: 809 | Disposition: A | Discharge status: Home / Self Care | Payer: Medicare Other | Source: Ambulatory Visit | Attending: Oncology | Admitting: Oncology

## 2019-02-08 VITALS — BP 138/94 | HR 91 | Temp 101.4°F | Resp 18

## 2019-02-08 VITALS — Temp 98.8°F

## 2019-02-08 DIAGNOSIS — C189 Malignant neoplasm of colon, unspecified: Secondary | ICD-10-CM

## 2019-02-08 DIAGNOSIS — D701 Agranulocytosis secondary to cancer chemotherapy: Secondary | ICD-10-CM | POA: Diagnosis present

## 2019-02-08 DIAGNOSIS — J45909 Unspecified asthma, uncomplicated: Secondary | ICD-10-CM | POA: Diagnosis present

## 2019-02-08 DIAGNOSIS — M170 Bilateral primary osteoarthritis of knee: Secondary | ICD-10-CM | POA: Diagnosis present

## 2019-02-08 DIAGNOSIS — C182 Malignant neoplasm of ascending colon: Secondary | ICD-10-CM | POA: Diagnosis present

## 2019-02-08 DIAGNOSIS — R509 Fever, unspecified: Secondary | ICD-10-CM

## 2019-02-08 DIAGNOSIS — E079 Disorder of thyroid, unspecified: Secondary | ICD-10-CM | POA: Diagnosis present

## 2019-02-08 DIAGNOSIS — Z880 Allergy status to penicillin: Secondary | ICD-10-CM | POA: Diagnosis not present

## 2019-02-08 DIAGNOSIS — Z760 Encounter for issue of repeat prescription: Secondary | ICD-10-CM

## 2019-02-08 DIAGNOSIS — R5081 Fever presenting with conditions classified elsewhere: Secondary | ICD-10-CM | POA: Diagnosis present

## 2019-02-08 DIAGNOSIS — H9190 Unspecified hearing loss, unspecified ear: Secondary | ICD-10-CM | POA: Diagnosis present

## 2019-02-08 DIAGNOSIS — D709 Neutropenia, unspecified: Secondary | ICD-10-CM

## 2019-02-08 DIAGNOSIS — Z8249 Family history of ischemic heart disease and other diseases of the circulatory system: Secondary | ICD-10-CM

## 2019-02-08 DIAGNOSIS — T451X5A Adverse effect of antineoplastic and immunosuppressive drugs, initial encounter: Secondary | ICD-10-CM

## 2019-02-08 DIAGNOSIS — Z20822 Contact with and (suspected) exposure to covid-19: Secondary | ICD-10-CM | POA: Diagnosis present

## 2019-02-08 DIAGNOSIS — C772 Secondary and unspecified malignant neoplasm of intra-abdominal lymph nodes: Secondary | ICD-10-CM | POA: Diagnosis present

## 2019-02-08 DIAGNOSIS — K219 Gastro-esophageal reflux disease without esophagitis: Secondary | ICD-10-CM | POA: Diagnosis present

## 2019-02-08 DIAGNOSIS — Z91012 Allergy to eggs: Secondary | ICD-10-CM

## 2019-02-08 DIAGNOSIS — Z91018 Allergy to other foods: Secondary | ICD-10-CM

## 2019-02-08 DIAGNOSIS — Z95828 Presence of other vascular implants and grafts: Secondary | ICD-10-CM

## 2019-02-08 DIAGNOSIS — C779 Secondary and unspecified malignant neoplasm of lymph node, unspecified: Secondary | ICD-10-CM

## 2019-02-08 DIAGNOSIS — C7989 Secondary malignant neoplasm of other specified sites: Secondary | ICD-10-CM

## 2019-02-08 DIAGNOSIS — Z87891 Personal history of nicotine dependence: Secondary | ICD-10-CM | POA: Diagnosis not present

## 2019-02-08 LAB — RESP PANEL BY RT PCR (RSV, FLU A&B, COVID)
Influenza A by PCR: NEGATIVE
Influenza B by PCR: NEGATIVE
Respiratory Syncytial Virus by PCR: NEGATIVE
SARS Coronavirus 2 by RT PCR: NEGATIVE

## 2019-02-08 LAB — CBC WITH DIFFERENTIAL (CANCER CENTER ONLY)
Abs Immature Granulocytes: 0 10*3/uL (ref 0.00–0.07)
Basophils Absolute: 0 10*3/uL (ref 0.0–0.1)
Basophils Relative: 1 %
Eosinophils Absolute: 0 10*3/uL (ref 0.0–0.5)
Eosinophils Relative: 1 %
HCT: 32.7 % — ABNORMAL LOW (ref 39.0–52.0)
Hemoglobin: 10.3 g/dL — ABNORMAL LOW (ref 13.0–17.0)
Immature Granulocytes: 0 %
Lymphocytes Relative: 43 %
Lymphs Abs: 0.7 10*3/uL (ref 0.7–4.0)
MCH: 25.3 pg — ABNORMAL LOW (ref 26.0–34.0)
MCHC: 31.5 g/dL (ref 30.0–36.0)
MCV: 80.3 fL (ref 80.0–100.0)
Monocytes Absolute: 0.4 10*3/uL (ref 0.1–1.0)
Monocytes Relative: 22 %
Neutro Abs: 0.5 10*3/uL — ABNORMAL LOW (ref 1.7–7.7)
Neutrophils Relative %: 33 %
Platelet Count: 142 10*3/uL — ABNORMAL LOW (ref 150–400)
RBC: 4.07 MIL/uL — ABNORMAL LOW (ref 4.22–5.81)
RDW: 16.3 % — ABNORMAL HIGH (ref 11.5–15.5)
WBC Count: 1.6 10*3/uL — ABNORMAL LOW (ref 4.0–10.5)
nRBC: 0 % (ref 0.0–0.2)

## 2019-02-08 LAB — CMP (CANCER CENTER ONLY)
ALT: 30 U/L (ref 0–44)
AST: 29 U/L (ref 15–41)
Albumin: 4 g/dL (ref 3.5–5.0)
Alkaline Phosphatase: 61 U/L (ref 38–126)
Anion gap: 9 (ref 5–15)
BUN: 6 mg/dL (ref 6–20)
CO2: 23 mmol/L (ref 22–32)
Calcium: 8.5 mg/dL — ABNORMAL LOW (ref 8.9–10.3)
Chloride: 106 mmol/L (ref 98–111)
Creatinine: 1.09 mg/dL (ref 0.61–1.24)
GFR, Est AFR Am: >60 mL/min (ref >60)
GFR, Estimated: >60 mL/min (ref >60)
Glucose, Bld: 98 mg/dL (ref 70–99)
Potassium: 3.7 mmol/L (ref 3.5–5.1)
Sodium: 138 mmol/L (ref 135–145)
Total Bilirubin: 0.3 mg/dL (ref 0.3–1.2)
Total Protein: 7 g/dL (ref 6.5–8.1)

## 2019-02-08 LAB — URINALYSIS, COMPLETE (UACMP) WITH MICROSCOPIC
Bilirubin Urine: NEGATIVE
Glucose, UA: NEGATIVE mg/dL
Hgb urine dipstick: NEGATIVE
Ketones, ur: NEGATIVE mg/dL
Leukocytes,Ua: NEGATIVE
Nitrite: NEGATIVE
Protein, ur: NEGATIVE mg/dL
Specific Gravity, Urine: 1.02 (ref 1.005–1.030)
pH: 6 (ref 5.0–8.0)

## 2019-02-08 IMAGING — CR DG CHEST 2V
2 series · 2 of 2 positions shown · non-contrast
Comparison: [DATE]

CLINICAL DATA: Fever.  Neutropenia.  Colon cancer.

EXAM:
CHEST - 2 VIEW

[w chest pa]
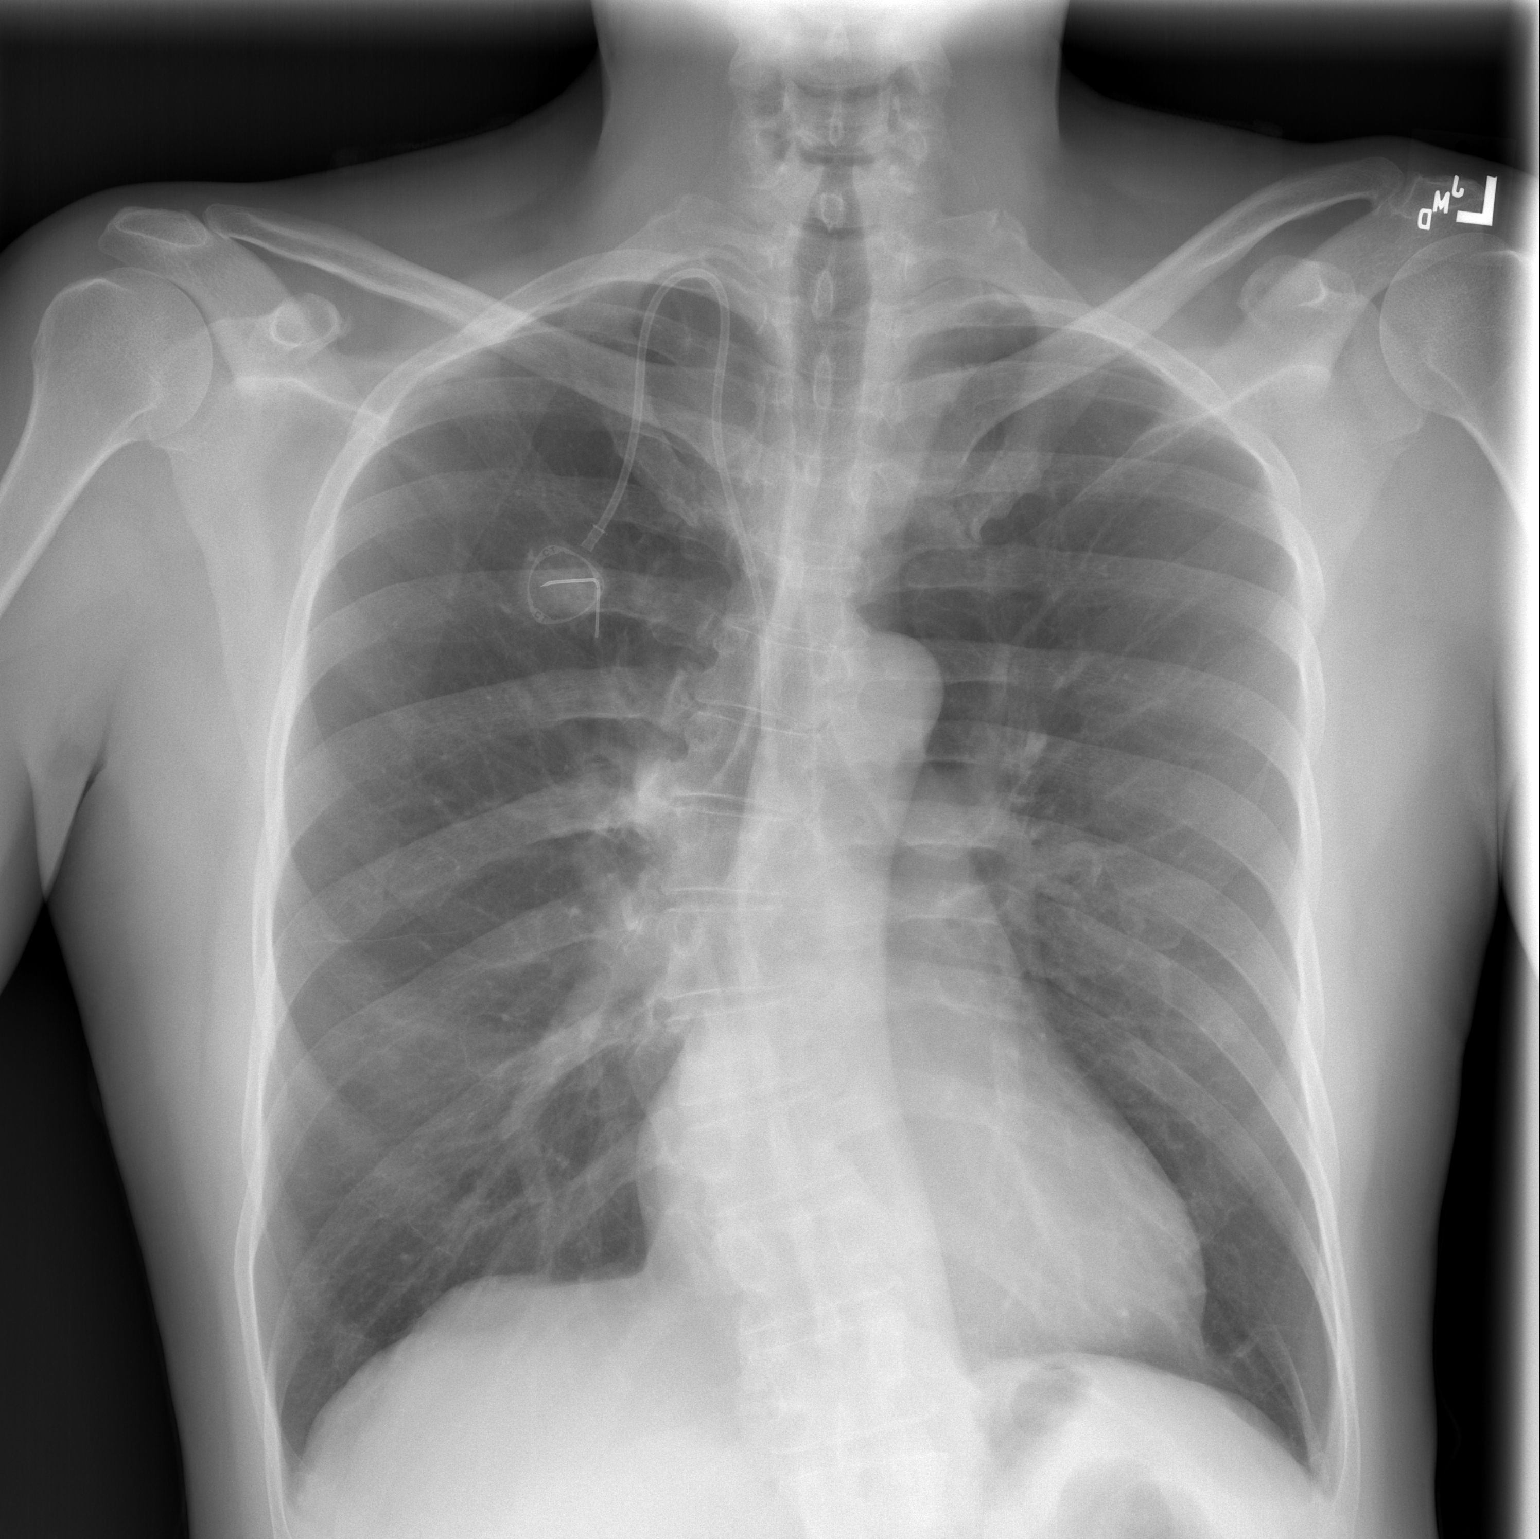

[w chest lat]
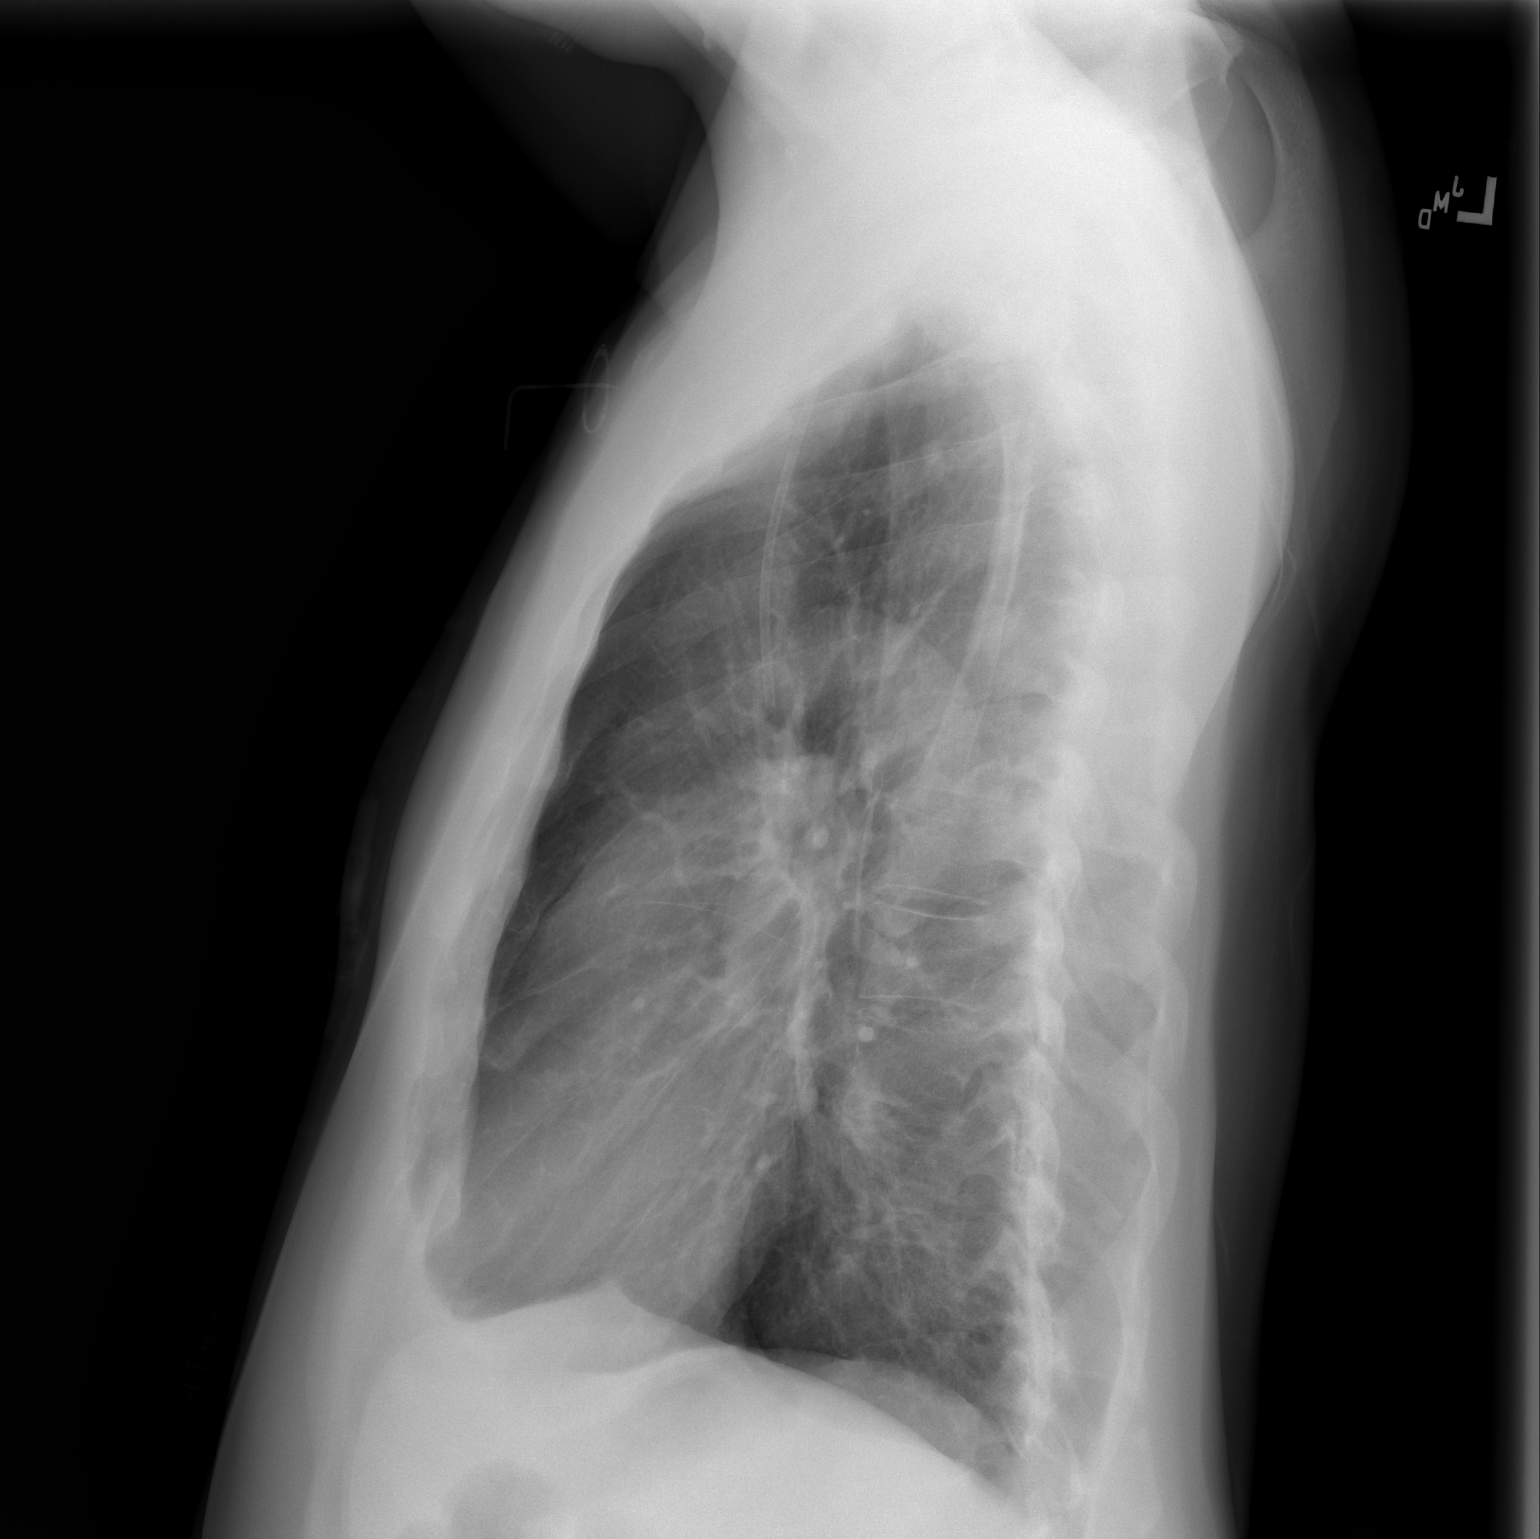

[2 of 2 positions shown; findings below may reference images not displayed]

FINDINGS: Heart size is normal. Power port in place from a right internal
jugular approach has its tip in the SVC above the right atrium.
Narrow AP diameter of the chest. No infiltrate, collapse or
effusion. Chronic scoliosis.
IMPRESSION: No active disease. Chronic scoliosis. Power port tip in the SVC
above the right atrium.

## 2019-02-08 MED ORDER — VANCOMYCIN HCL 1500 MG/300ML IV SOLN
1500.0000 mg | Freq: Once | INTRAVENOUS | Status: DC
  Filled 2019-02-08: qty 300

## 2019-02-08 MED ORDER — VANCOMYCIN HCL IN DEXTROSE 1-5 GM/200ML-% IV SOLN: 1000.0000 mg | Freq: Two times a day (BID) | INTRAVENOUS | Status: DC

## 2019-02-08 MED ORDER — ALBUTEROL SULFATE (2.5 MG/3ML) 0.083% IN NEBU: 2.5000 mg | INHALATION_SOLUTION | RESPIRATORY_TRACT | Status: DC | PRN

## 2019-02-08 MED ORDER — PANTOPRAZOLE SODIUM 40 MG PO TBEC
40.0000 mg | DELAYED_RELEASE_TABLET | Freq: Every day | ORAL | Status: DC
  Administered 2019-02-09 – 2019-02-11 (×3): 40 mg via ORAL
  Filled 2019-02-08 (×3): qty 1

## 2019-02-08 MED ORDER — MONTELUKAST SODIUM 10 MG PO TABS
10.0000 mg | ORAL_TABLET | Freq: Every day | ORAL | Status: DC
  Administered 2019-02-08 – 2019-02-10 (×3): 10 mg via ORAL
  Filled 2019-02-08 (×3): qty 1

## 2019-02-08 MED ORDER — VANCOMYCIN HCL 1000 MG IV SOLR
1500.0000 mg | Freq: Once | INTRAVENOUS | Status: DC
  Filled 2019-02-08: qty 1500

## 2019-02-08 MED ORDER — ALBUTEROL SULFATE HFA 108 (90 BASE) MCG/ACT IN AERS: 2.0000 | INHALATION_SPRAY | RESPIRATORY_TRACT | Status: DC | PRN

## 2019-02-08 MED ORDER — ENOXAPARIN SODIUM 40 MG/0.4ML ~~LOC~~ SOLN
40.0000 mg | SUBCUTANEOUS | Status: DC
  Administered 2019-02-08 – 2019-02-10 (×3): 40 mg via SUBCUTANEOUS
  Filled 2019-02-08 (×3): qty 0.4

## 2019-02-08 MED ORDER — ONDANSETRON HCL 8 MG PO TABS: 8.0000 mg | ORAL_TABLET | Freq: Three times a day (TID) | ORAL | Status: DC | PRN

## 2019-02-08 MED ORDER — SODIUM CHLORIDE 0.9 % IV SOLN
Freq: Once | INTRAVENOUS | Status: AC
  Filled 2019-02-08: qty 250

## 2019-02-08 MED ORDER — SODIUM CHLORIDE 0.9 % IV SOLN
2.0000 g | Freq: Three times a day (TID) | INTRAVENOUS | Status: DC
  Administered 2019-02-08 – 2019-02-11 (×9): 2 g via INTRAVENOUS
  Filled 2019-02-08 (×10): qty 2

## 2019-02-08 MED ORDER — VANCOMYCIN HCL 1000 MG IV SOLR
1500.0000 mg | Freq: Once | INTRAVENOUS | Status: AC
  Administered 2019-02-08: 1500 mg via INTRAVENOUS
  Filled 2019-02-08: qty 1500

## 2019-02-08 MED ORDER — ACETAMINOPHEN 325 MG PO TABS: 650.0000 mg | ORAL_TABLET | Freq: Once | ORAL | Status: DC

## 2019-02-08 MED ORDER — SODIUM CHLORIDE 0.9 % IV SOLN
2.0000 g | Freq: Once | INTRAVENOUS | Status: AC
  Administered 2019-02-08: 2 g via INTRAVENOUS
  Filled 2019-02-08: qty 2

## 2019-02-08 MED ORDER — ACETAMINOPHEN 325 MG PO TABS: 650.0000 mg | ORAL_TABLET | Freq: Four times a day (QID) | ORAL | Status: DC | PRN

## 2019-02-08 MED ORDER — SODIUM CHLORIDE 0.9% FLUSH
10.0000 mL | INTRAVENOUS | Status: DC | PRN
  Administered 2019-02-08: 10 mL
  Filled 2019-02-08: qty 10

## 2019-02-08 MED ORDER — VANCOMYCIN HCL IN DEXTROSE 1-5 GM/200ML-% IV SOLN
1000.0000 mg | Freq: Two times a day (BID) | INTRAVENOUS | Status: DC
  Administered 2019-02-09 – 2019-02-11 (×5): 1000 mg via INTRAVENOUS
  Filled 2019-02-08 (×5): qty 200

## 2019-02-08 MED ORDER — SODIUM CHLORIDE 0.9 % IV SOLN
2.0000 g | Freq: Once | INTRAVENOUS | Status: DC
  Filled 2019-02-08: qty 2

## 2019-02-08 MED ORDER — POTASSIUM CHLORIDE IN NACL 20-0.9 MEQ/L-% IV SOLN
INTRAVENOUS | Status: DC
  Filled 2019-02-08 (×4): qty 1000

## 2019-02-08 MED ORDER — FLUTICASONE FUROATE-VILANTEROL 200-25 MCG/INH IN AEPB
1.0000 | INHALATION_SPRAY | Freq: Every day | RESPIRATORY_TRACT | Status: DC
  Administered 2019-02-09 – 2019-02-11 (×3): 1 via RESPIRATORY_TRACT
  Filled 2019-02-08: qty 28

## 2019-02-08 MED ORDER — PROCHLORPERAZINE MALEATE 10 MG PO TABS: 10.0000 mg | ORAL_TABLET | Freq: Four times a day (QID) | ORAL | Status: DC | PRN

## 2019-02-08 MED ORDER — ALBUTEROL SULFATE HFA 108 (90 BASE) MCG/ACT IN AERS: 2.0000 | INHALATION_SPRAY | RESPIRATORY_TRACT | 3 refills | Status: DC | PRN

## 2019-02-08 MED FILL — ALBUTEROL SULFATE HFA 108 (: 108 (90 BAS | 16 days supply | Qty: 18 | Fill #0

## 2019-02-08 NOTE — Progress Notes (Signed)
See history and physical

## 2019-02-08 NOTE — Progress Notes (Signed)
This patient was seen for COVID-19, influenza, and RSV testing.  A sample was collected with the results returning negative.  This was done as part of the patient's admission for febrile neutropenia.  Sandi Mealy, MHS, PA-C Physician Assistant

## 2019-02-08 NOTE — Progress Notes (Addendum)
Pharmacy Antibiotic Note  Alexander Reeves. is a 54 y.o. male with colon cancer on chemotherapy (had third cycle on 01/25/19) presented to [facility]  on 02/08/2019 for management of febrile neutropenia. Pharmacy was consulted to started vancomycin and aztreonam for broad coverage.  - ANC 0.5 - scr 1.09 (crcl~81)  Plan: - vancomycin 1500 mg IV x1, then 1000 mg q12h for est AUC 526 - aztreonam 2gm q8h  Adden: pt received vancomycin 1500 mg dose at 1333 and aztreonam 2gm at 1234 at [facility] on 1/26 ___________________________________  Height: 5\' 10"  (177.8 cm) Weight: 160 lb (72.6 kg) IBW/kg (Calculated) : 73  Temp (24hrs), Avg:100.1 F (37.8 C), Min:98.8 F (37.1 C), Max:101.4 F (38.6 C)  Recent Labs  Lab 02/08/19 0906  WBC 1.6*  CREATININE 1.09    Estimated Creatinine Clearance: 81.4 mL/min (by C-G formula based on SCr of 1.09 mg/dL).    Allergies  Allergen Reactions  . Cheese Shortness Of Breath and Swelling    Swelling of the throat  . Eggs Or Egg-Derived Products Shortness Of Breath and Swelling    Swelling of the throat  . Penicillins Anaphylaxis    Has patient had a PCN reaction causing immediate rash, facial/tongue/throat swelling, SOB or lightheadedness with hypotension: Yes Has patient had a PCN reaction causing severe rash involving mucus membranes or skin necrosis: Unknown Has patient had a PCN reaction that required hospitalization: Unknown Has patient had a PCN reaction occurring within the last 10 years: Unknown If all of the above answers are "NO", then may proceed with Cephalosporin use.     Thank you for allowing pharmacy to be a part of this patient's care.  Lynelle Doctor 02/08/2019 4:47 PM

## 2019-02-08 NOTE — H&P (Addendum)
Admission History and Physical      Chief Complaint: Fever HPI: Mr. Tomeo is a 54 year old man diagnosed with stage IIIb colon cancer November 2020.  He began adjuvant chemotherapy with FOLFOX on 12/28/2018.  He completed cycle 3 on 01/24/2018.  He presents to the office today prior to proceeding with cycle 4.  At the screening desk he was found to be febrile.  His main complaint is being "tired".  He was not aware of a fever prior to coming to the office today.  No shaking chills.  He has stable dyspnea on exertion.  He utilizes several inhalers for asthma.  No cough.  He notes that his mouth and throat are dry.  He denies mouth sores.  No nausea, vomiting, diarrhea.  No skin rash.  No pain at the Port-A-Cath site.   Past Medical History:  Diagnosis Date  . Arthritis    KNEES  . Asthma   . Deaf   . GERD (gastroesophageal reflux disease)    OCC  . Scrotal cyst   . Thyroid disease   . Urinary frequency    OCC  :  Past Surgical History:  Procedure Laterality Date  . BIOPSY  11/19/2018   Procedure: BIOPSY;  Surgeon: Thornton Park, MD;  Location: [place];  Service: Gastroenterology;;  . BLADDER SURGERY  YRS AGO  . COLONOSCOPY WITH PROPOFOL N/A 11/19/2018   Procedure: COLONOSCOPY WITH PROPOFOL;  Surgeon: Thornton Park, MD;  Location: [place];  Service: Gastroenterology;  Laterality: N/A;  . EPIDIDYMECTOMY Right 03/22/2018   Procedure: SCROTAL EXPLORATION RIGHT  PARTIAL EPIDIDYMECTOMY;  Surgeon: [name], MD;  Location: [facility];  Service: Urology;  Laterality: Right;  . HEMORRHOID SURGERY  WHEN YOUNG  . LAPAROTOMY N/A 11/19/2018   Procedure: EXPLORATORY LAPAROTOMY;  Surgeon: Rolm Bookbinder, MD;  Location: [place];  Service: General;  Laterality: N/A;  . NO PAST SURGERIES    . PARTIAL COLECTOMY Right 11/19/2018   Procedure: PARTIAL COLECTOMY;  Surgeon: Rolm Bookbinder, MD;  Location: [place];  Service: General;  Laterality: Right;  .  PORTACATH PLACEMENT Right 12/23/2018   Procedure: INSERTION PORT-A-CATH WITH ULTRASOUND GUIDANCE;  Surgeon: Rolm Bookbinder, MD;  Location: [place];  Service: General;  Laterality: Right;  . SUBMUCOSAL TATTOO INJECTION  11/19/2018   Procedure: SUBMUCOSAL TATTOO INJECTION;  Surgeon: Thornton Park, MD;  Location: [place];  Service: Gastroenterology;;  . [name]  :  No current facility-administered medications for this encounter.  Current Outpatient Medications:  .  albuterol (PROVENTIL) (2.5 MG/3ML) 0.083% nebulizer solution, Take 3 mLs (2.5 mg total) by nebulization every 6 (six) hours as needed for wheezing or shortness of breath., Disp: 75 mL, Rfl: 12 .  albuterol (VENTOLIN HFA) 108 (90 Base) MCG/ACT inhaler, Inhale 2 puffs into the lungs every 4 (four) hours as needed for wheezing or shortness of breath., Disp: 6.7 g, Rfl: 3 .  fenofibrate (TRICOR) 145 MG tablet, Take 1 tablet (145 mg total) by mouth daily. (Patient not taking: Reported on 01/25/2019), Disp: 90 tablet, Rfl: 3 .  fluticasone furoate-vilanterol (BREO ELLIPTA) 200-25 MCG/INH AEPB, Inhale 1 puff into the lungs daily., Disp: 60 each, Rfl: 11 .  lidocaine-prilocaine (EMLA) cream, Apply 1 application topically as directed. Apply 1 hour prior to stick and cover with plastic wrap, Disp: 30 g, Rfl: 1 .  loratadine (CLARITIN) 10 MG tablet, Take 1 tablet (10 mg total) by mouth daily., Disp: 30 tablet, Rfl: 11 .  montelukast (  SINGULAIR) 10 MG tablet, Take 1 tablet (10 mg total) by mouth at bedtime., Disp: 30 tablet, Rfl: 11 .  ondansetron (ZOFRAN) 8 MG tablet, Take 1 tablet (8 mg total) by mouth every 8 (eight) hours as needed for nausea or vomiting. Start 72 hours after IV chemo treatment day (Patient not taking: Reported on 01/11/2019), Disp: 30 tablet, Rfl: 1 .  pantoprazole (PROTONIX) 40 MG tablet, Take 1 tablet (40 mg total) by mouth daily., Disp: 30 tablet, Rfl: 3 .  potassium chloride SA  (KLOR-CON) 20 MEQ tablet, Take 1 tablet (20 mEq total) by mouth daily., Disp: 30 tablet, Rfl: 1 .  prochlorperazine (COMPAZINE) 10 MG tablet, Take 1 tablet (10 mg total) by mouth every 6 (six) hours as needed for nausea. (Patient not taking: Reported on 01/11/2019), Disp: 60 tablet, Rfl: 1  Facility-Administered Medications Ordered in Other Encounters:  .  sodium chloride flush (NS) 0.9 % injection 10 mL, 10 mL, Intracatheter, PRN, Ladell Pier, MD, 10 mL at 02/08/19 0859:  :  Allergies  Allergen Reactions  . Cheese Shortness Of Breath and Swelling    Swelling of the throat  . Eggs Or Egg-Derived Products Shortness Of Breath and Swelling    Swelling of the throat  . Penicillins Anaphylaxis    Has patient had a PCN reaction causing immediate rash, facial/tongue/throat swelling, SOB or lightheadedness with hypotension: Yes Has patient had a PCN reaction causing severe rash involving mucus membranes or skin necrosis: Unknown Has patient had a PCN reaction that required hospitalization: Unknown Has patient had a PCN reaction occurring within the last 10 years: Unknown If all of the above answers are "NO", then may proceed with Cephalosporin use.   :   Past Medical History:  Diagnosis Date  . Arthritis    KNEES  . Asthma   . Deaf   . GERD (gastroesophageal reflux disease)    OCC  . Scrotal cyst   . Thyroid disease   . Urinary frequency    OCC    Past Surgical History:  Procedure Laterality Date  . BIOPSY  11/19/2018   Procedure: BIOPSY;  Surgeon: Thornton Park, MD;  Location: [place];  Service: Gastroenterology;;  . BLADDER SURGERY  YRS AGO  . COLONOSCOPY WITH PROPOFOL N/A 11/19/2018   Procedure: COLONOSCOPY WITH PROPOFOL;  Surgeon: Thornton Park, MD;  Location: [place];  Service: Gastroenterology;  Laterality: N/A;  . EPIDIDYMECTOMY Right 03/22/2018   Procedure: SCROTAL EXPLORATION RIGHT  PARTIAL EPIDIDYMECTOMY;  Surgeon: [name], MD;  Location:  [facility];  Service: Urology;  Laterality: Right;  . HEMORRHOID SURGERY  WHEN YOUNG  . LAPAROTOMY N/A 11/19/2018   Procedure: EXPLORATORY LAPAROTOMY;  Surgeon: Rolm Bookbinder, MD;  Location: [place];  Service: General;  Laterality: N/A;  . NO PAST SURGERIES    . PARTIAL COLECTOMY Right 11/19/2018   Procedure: PARTIAL COLECTOMY;  Surgeon: Rolm Bookbinder, MD;  Location: [place];  Service: General;  Laterality: Right;  . PORTACATH PLACEMENT Right 12/23/2018   Procedure: INSERTION PORT-A-CATH WITH ULTRASOUND GUIDANCE;  Surgeon: Rolm Bookbinder, MD;  Location: [place];  Service: General;  Laterality: Right;  . SUBMUCOSAL TATTOO INJECTION  11/19/2018   Procedure: SUBMUCOSAL TATTOO INJECTION;  Surgeon: Thornton Park, MD;  Location: [place];  Service: Gastroenterology;;  . [name]     Allergies  Allergen Reactions  . Cheese Shortness Of Breath and Swelling    Swelling of the throat  . Eggs Or Egg-Derived  Products Shortness Of Breath and Swelling    Swelling of the throat  . Penicillins Anaphylaxis    Has patient had a PCN reaction causing immediate rash, facial/tongue/throat swelling, SOB or lightheadedness with hypotension: Yes Has patient had a PCN reaction causing severe rash involving mucus membranes or skin necrosis: Unknown Has patient had a PCN reaction that required hospitalization: Unknown Has patient had a PCN reaction occurring within the last 10 years: Unknown If all of the above answers are "NO", then may proceed with Cephalosporin use.     Family History  Problem Relation Age of Onset  . Heart disease Father      reports that he quit smoking about 2 years ago. His smoking use included cigarettes. He has never used smokeless tobacco. He reports current alcohol use. He reports current drug use. Drug: Marijuana.  ROS: Mr. Riggenbach was not aware of a fever prior to coming to the office today.  No shaking chills.  Main  complaint is being "tired".  No cough.  He has stable dyspnea on exertion.  He utilizes several inhalers for asthma.  Mouth and throat feel "dry".  No mouth sores.  He denies nausea/vomiting/diarrhea.  No skin rash.  No pain at the Port-A-Cath site.  Physical: Temperature 101.4, heart rate 91, respirations 18, blood pressure 138/94 General: No acute distress. HEENT: Sclera anicteric.  Mild white coating of her tongue.  No buccal thrush.  Tongue appears dry. Chest: Lungs with faint wheezes anteriorly.  No respiratory distress. Cardiovascular: Regular rate and rhythm. Abdomen: Abdomen soft and nontender.  No hepatomegaly. Extremities: No leg edema. Neuro: Alert and oriented. Skin: Palms with hyperpigmentation. Port-A-Cath without erythema, nontender.  Labs:  Results for orders placed or performed in visit on 02/08/19 (from the past 48 hour(s))  CMP ([place] only)     Status: Abnormal   Collection Time: 02/08/19  9:06 AM  Result Value Ref Range   Sodium 138 135 - 145 mmol/L   Potassium 3.7 3.5 - 5.1 mmol/L   Chloride 106 98 - 111 mmol/L   CO2 23 22 - 32 mmol/L   Glucose, Bld 98 70 - 99 mg/dL   BUN 6 6 - 20 mg/dL   Creatinine 1.09 0.61 - 1.24 mg/dL   Calcium 8.5 (L) 8.9 - 10.3 mg/dL   Total Protein 7.0 6.5 - 8.1 g/dL   Albumin 4.0 3.5 - 5.0 g/dL   AST 29 15 - 41 U/L   ALT 30 0 - 44 U/L   Alkaline Phosphatase 61 38 - 126 U/L   Total Bilirubin 0.3 0.3 - 1.2 mg/dL   GFR, Est Non Af Am >60 >60 mL/min   GFR, Est AFR Am >60 >60 mL/min   Anion gap 9 5 - 15    Comment: Performed at [facility], [address], [place], [place] [address]  CBC with Differential ([place] Only)     Status: Abnormal   Collection Time: 02/08/19  9:06 AM  Result Value Ref Range   WBC Count 1.6 (L) 4.0 - 10.5 K/uL   RBC 4.07 (L) 4.22 - 5.81 MIL/uL   Hemoglobin 10.3 (L) 13.0 - 17.0 g/dL   HCT 32.7 (L) 39.0 - 52.0 %   MCV 80.3 80.0 - 100.0 fL   MCH 25.3 (L) 26.0 - 34.0 pg    MCHC 31.5 30.0 - 36.0 g/dL   RDW 16.3 (H) 11.5 - 15.5 %   Platelet Count 142 (L) 150 - 400 K/uL  nRBC 0.0 0.0 - 0.2 %   Neutrophils Relative % 33 %   Neutro Abs 0.5 (L) 1.7 - 7.7 K/uL   Lymphocytes Relative 43 %   Lymphs Abs 0.7 0.7 - 4.0 K/uL   Monocytes Relative 22 %   Monocytes Absolute 0.4 0.1 - 1.0 K/uL   Eosinophils Relative 1 %   Eosinophils Absolute 0.0 0.0 - 0.5 K/uL   Basophils Relative 1 %   Basophils Absolute 0.0 0.0 - 0.1 K/uL   Immature Granulocytes 0 %   Abs Immature Granulocytes 0.00 0.00 - 0.07 K/uL    Comment: Performed at [facility], [place] [address], [place], [place] [address]   No results found.  Assessment/Plan  1. Moderately differentiated adenocarcinoma ascending colon, stage IIIb (pT3pN1), status post a right colectomy 11/19/2018 ? Lymphovascular and perineural invasion present, 2/14 lymph nodes positive, tumor deposits present ? Positive radial margin, no loss of mismatch repair protein expression ? Colonoscopy 11/19/2018-completely obstructing mid ascending colon mass, could not be passed with endoscope, biopsy confirmed invasive adenocarcinoma ? CT abdomen/pelvis 11/18/2018-wall thickening at the mid and distal ascending colon with mild distention of the proximal ascending colon and cecum ? CTs 10/17/2018--no acute findings, no chest lymphadenopathy, lungs clear ? Cycle 1 FOLFOX 12/28/2018 ? Cycle 2 FOLFOX 01/11/2019, Udenyca added  ? Cycle 3 FOLFOX 01/25/2019, Udenyca held due to bone pain, oxaliplatin dose reduced  2. Deaf 3. Right epididymal cyst removal 03/22/2018 4. Asthma 5. Port-A-Cath placement, [name], 12/23/2018 6. Neutropenia secondary to chemotherapy-Udenyca added for cycle 2 FOLFOX 7. Fever, neutropenia 02/08/2019  Mr. Vetrano is a 54 year old man diagnosed with stage IIIb colon cancer November 2020 currently completing adjuvant chemotherapy.  He completed the third cycle of FOLFOX 01/25/2019.  He presents to  the office today for routine follow-up prior to cycle 4 FOLFOX.  At the screening area he was found to be febrile.  Labs confirm neutropenia.  There is no localizing source for infection.  We have obtained blood cultures, urine culture, chest x-ray, influenza and covid swabs.  We are making arrangements for hospital admission.  We are beginning antibiotics in the office due to no current bed availability.  Sign language interpreter present throughout visit.  Ned Card ANP/GNP-BC 02/08/2019, 9:53 AM   Mr. Ysaguirre was interviewed and examined.  He will be admitted for febrile neutropenia.  Cultures were obtained and he will begin broad-spectrum intravenous antibiotics.  He appears stable for admission to the oncology floor.  I discussed the case with his daughter by telephone.

## 2019-02-08 NOTE — Progress Notes (Signed)
Results are noted. This report has been forwarded to the patient's primary oncologist.

## 2019-02-09 LAB — CBC WITH DIFFERENTIAL/PLATELET
Abs Immature Granulocytes: 0 10*3/uL (ref 0.00–0.07)
Basophils Absolute: 0 10*3/uL (ref 0.0–0.1)
Basophils Relative: 1 %
Eosinophils Absolute: 0 10*3/uL (ref 0.0–0.5)
Eosinophils Relative: 1 %
HCT: 31.3 % — ABNORMAL LOW (ref 39.0–52.0)
Hemoglobin: 9.6 g/dL — ABNORMAL LOW (ref 13.0–17.0)
Immature Granulocytes: 0 %
Lymphocytes Relative: 51 %
Lymphs Abs: 0.7 10*3/uL (ref 0.7–4.0)
MCH: 25.1 pg — ABNORMAL LOW (ref 26.0–34.0)
MCHC: 30.7 g/dL (ref 30.0–36.0)
MCV: 81.9 fL (ref 80.0–100.0)
Monocytes Absolute: 0.3 10*3/uL (ref 0.1–1.0)
Monocytes Relative: 22 %
Neutro Abs: 0.4 10*3/uL — ABNORMAL LOW (ref 1.7–7.7)
Neutrophils Relative %: 25 %
Platelets: 133 10*3/uL — ABNORMAL LOW (ref 150–400)
RBC: 3.82 MIL/uL — ABNORMAL LOW (ref 4.22–5.81)
RDW: 16.7 % — ABNORMAL HIGH (ref 11.5–15.5)
WBC: 1.4 10*3/uL — CL (ref 4.0–10.5)
nRBC: 0 % (ref 0.0–0.2)

## 2019-02-09 LAB — BASIC METABOLIC PANEL
Anion gap: 6 (ref 5–15)
BUN: 6 mg/dL (ref 6–20)
CO2: 26 mmol/L (ref 22–32)
Calcium: 8.9 mg/dL (ref 8.9–10.3)
Chloride: 107 mmol/L (ref 98–111)
Creatinine, Ser: 1.13 mg/dL (ref 0.61–1.24)
GFR calc Af Amer: >60 mL/min (ref >60)
GFR calc non Af Amer: >60 mL/min (ref >60)
Glucose, Bld: 93 mg/dL (ref 70–99)
Potassium: 4 mmol/L (ref 3.5–5.1)
Sodium: 139 mmol/L (ref 135–145)

## 2019-02-09 LAB — URINE CULTURE: Culture: NO GROWTH

## 2019-02-09 MED ORDER — CHLORHEXIDINE GLUCONATE CLOTH 2 % EX PADS
6.0000 | MEDICATED_PAD | Freq: Every day | CUTANEOUS | Status: DC
  Administered 2019-02-09 – 2019-02-11 (×3): 6 via TOPICAL

## 2019-02-09 NOTE — Progress Notes (Signed)
IP PROGRESS NOTE  Subjective:   Alexander Reeves is seen this morning with the aid of a sign language interpreter.  No further fever or chills.  He reports urinary frequency. Objective: Vital signs in last 24 hours: Blood pressure (!) 134/98, pulse 88, temperature 98.3 F (36.8 C), temperature source Oral, resp. rate 15, height '5\' 10"'  (1.778 m), weight 160 lb (72.6 kg), SpO2 97 %.  Intake/Output from previous day: 01/26 0701 - 01/27 0700 In: 2176 [P.O.:480; I.V.:1296; IV Piggyback:400] Out: -   Physical Exam:  HEENT: White coat over the tongue, no buccal thrush or ulcers Lungs: Decreased breath sounds with end inspiratory rhonchi at the left lower posterior chest, no respiratory distress Cardiac: Regular rate and rhythm Abdomen: Soft and nontender, no hepatosplenomegaly Extremities: No leg edema Skin: Mild hyperpigmentation of the hands  Portacath/PICC-without erythema  Lab Results: Recent Labs    02/08/19 0906 02/09/19 0431  WBC 1.6* 1.4*  HGB 10.3* 9.6*  HCT 32.7* 31.3*  PLT 142* 133*  02/09/2019: ANC 0.4  BMET Recent Labs    02/08/19 0906 02/09/19 0431  NA 138 139  K 3.7 4.0  CL 106 107  CO2 23 26  GLUCOSE 98 93  BUN 6 6  CREATININE 1.09 1.13  CALCIUM 8.5* 8.9    Lab Results  Component Value Date   CEA1 2.7 11/18/2018    Studies/Results: DG Chest 2 View  Result Date: 02/08/2019 CLINICAL DATA:  Fever.  Neutropenia.  Colon cancer. EXAM: CHEST - 2 VIEW COMPARISON:  12/23/2018 FINDINGS: Heart size is normal. Power port in place from a right internal jugular approach has its tip in the SVC above the right atrium. Narrow AP diameter of the chest. No infiltrate, collapse or effusion. Chronic scoliosis. IMPRESSION: No active disease. Chronic scoliosis. Power port tip in the SVC above the right atrium. Electronically Signed   By: Nelson Chimes M.D.   On: 02/08/2019 10:35    Medications: I have reviewed the patient's current  medications.  Assessment/Plan:  1. Moderately differentiated adenocarcinoma ascending colon, stage IIIb (pT3pN1), status post a right colectomy 11/19/2018 ? Lymphovascular and perineural invasion present, 2/14 lymph nodes positive, tumor deposits present ? Positive radial margin, no loss of mismatch repair protein expression ? Colonoscopy 11/19/2018-completely obstructing mid ascending colon mass, could not be passed with endoscope, biopsy confirmed invasive adenocarcinoma ? CT abdomen/pelvis 11/18/2018-wall thickening at the mid and distal ascending colon with mild distention of the proximal ascending colon and cecum ? CTs 10/17/2018--no acute findings, no chest lymphadenopathy, lungs clear ? Cycle 1 FOLFOX 12/28/2018 ? Cycle 2 FOLFOX 01/11/2019, Udenyca added ? Cycle 3 FOLFOX 01/25/2019, Udenyca held due to bone pain, oxaliplatin dose reduced  2. Deaf 3. Right epididymal cyst removal 03/22/2018 4. Asthma 5. Port-A-Cath placement, [name], 12/23/2018 6. Neutropenia secondary to chemotherapy-Udenyca added for cycle 2 FOLFOX 7. Admission with fever, neutropenia 02/08/2019  Alexander Reeves appears stable.  He is afebrile.  Cultures are negative to date.  There is no apparent source for infection.  The plan is to continue broad-spectrum intravenous antibiotics.  He has persistent neutropenia.  He will remain hospitalized until the white count improves.    LOS: 1 day   Betsy Coder, MD   02/09/2019, 6:53 AM

## 2019-02-10 ENCOUNTER — Inpatient Hospital Stay: Payer: Medicare Other

## 2019-02-10 LAB — CBC WITH DIFFERENTIAL/PLATELET
Abs Immature Granulocytes: 0.01 10*3/uL (ref 0.00–0.07)
Basophils Absolute: 0 10*3/uL (ref 0.0–0.1)
Basophils Relative: 1 %
Eosinophils Absolute: 0 10*3/uL (ref 0.0–0.5)
Eosinophils Relative: 3 %
HCT: 34.1 % — ABNORMAL LOW (ref 39.0–52.0)
Hemoglobin: 10.7 g/dL — ABNORMAL LOW (ref 13.0–17.0)
Immature Granulocytes: 1 %
Lymphocytes Relative: 44 %
Lymphs Abs: 0.7 10*3/uL (ref 0.7–4.0)
MCH: 25.7 pg — ABNORMAL LOW (ref 26.0–34.0)
MCHC: 31.4 g/dL (ref 30.0–36.0)
MCV: 82 fL (ref 80.0–100.0)
Monocytes Absolute: 0.5 10*3/uL (ref 0.1–1.0)
Monocytes Relative: 30 %
Neutro Abs: 0.3 10*3/uL — ABNORMAL LOW (ref 1.7–7.7)
Neutrophils Relative %: 21 %
Platelets: 146 10*3/uL — ABNORMAL LOW (ref 150–400)
RBC: 4.16 MIL/uL — ABNORMAL LOW (ref 4.22–5.81)
RDW: 16.9 % — ABNORMAL HIGH (ref 11.5–15.5)
WBC: 1.6 10*3/uL — ABNORMAL LOW (ref 4.0–10.5)
nRBC: 0 % (ref 0.0–0.2)

## 2019-02-10 NOTE — Progress Notes (Addendum)
IP PROGRESS NOTE  Subjective:   Alexander Reeves is seen this morning with the aid of a sign language interpreter.  No further fever or chills.  Urinary frequency has improved with decreasing IV fluid rate.  Reports some mild discomfort in his abdomen near the surgical site. He is eating and drinking well.  He has no other complaints this morning.  Objective: Vital signs in last 24 hours: Blood pressure 126/82, pulse 84, temperature 98.8 F (37.1 C), temperature source Oral, resp. rate 18, height '5\' 10"'  (1.778 m), weight 160 lb (72.6 kg), SpO2 99 %.  Intake/Output from previous day: 01/27 0701 - 01/28 0700 In: 240 [P.O.:240] Out: 1550 [Urine:1550]  Physical Exam:  HEENT: White coat over the tongue, no buccal thrush or ulcers Lungs: Clear bilaterally, no respiratory distress Cardiac: Regular rate and rhythm Abdomen: Soft and nontender, no hepatosplenomegaly, no mass Extremities: No leg edema Skin: Mild hyperpigmentation of the hands  Portacath/PICC-without erythema  Lab Results: Recent Labs    02/09/19 0431 02/10/19 0610  WBC 1.4* 1.6*  HGB 9.6* 10.7*  HCT 31.3* 34.1*  PLT 133* 146*  02/10/2019: ANC 0.3  BMET Recent Labs    02/08/19 0906 02/09/19 0431  NA 138 139  K 3.7 4.0  CL 106 107  CO2 23 26  GLUCOSE 98 93  BUN 6 6  CREATININE 1.09 1.13  CALCIUM 8.5* 8.9    Lab Results  Component Value Date   CEA1 2.7 11/18/2018    Studies/Results: DG Chest 2 View  Result Date: 02/08/2019 CLINICAL DATA:  Fever.  Neutropenia.  Colon cancer. EXAM: CHEST - 2 VIEW COMPARISON:  12/23/2018 FINDINGS: Heart size is normal. Power port in place from a right internal jugular approach has its tip in the SVC above the right atrium. Narrow AP diameter of the chest. No infiltrate, collapse or effusion. Chronic scoliosis. IMPRESSION: No active disease. Chronic scoliosis. Power port tip in the SVC above the right atrium. Electronically Signed   By: Nelson Chimes M.D.   On: 02/08/2019 10:35     Medications: I have reviewed the patient's current medications.  Assessment/Plan:  1. Moderately differentiated adenocarcinoma ascending colon, stage IIIb (pT3pN1), status post a right colectomy 11/19/2018 ? Lymphovascular and perineural invasion present, 2/14 lymph nodes positive, tumor deposits present ? Positive radial margin, no loss of mismatch repair protein expression ? Colonoscopy 11/19/2018-completely obstructing mid ascending colon mass, could not be passed with endoscope, biopsy confirmed invasive adenocarcinoma ? CT abdomen/pelvis 11/18/2018-wall thickening at the mid and distal ascending colon with mild distention of the proximal ascending colon and cecum ? CTs 10/17/2018--no acute findings, no chest lymphadenopathy, lungs clear ? Cycle 1 FOLFOX 12/28/2018 ? Cycle 2 FOLFOX 01/11/2019, Udenyca added ? Cycle 3 FOLFOX 01/25/2019, Udenyca held due to bone pain, oxaliplatin dose reduced  2. Deaf 3. Right epididymal cyst removal 03/22/2018 4. Asthma 5. Port-A-Cath placement, [name], 12/23/2018 6. Neutropenia secondary to chemotherapy-Udenyca added for cycle 2 FOLFOX 7. Admission with fever, neutropenia 02/08/2019  Alexander Reeves appears stable.  He is afebrile.  Cultures remain negative to date.  There is no apparent source for infection.  The plan is to continue broad-spectrum intravenous antibiotics.  He has persistent neutropenia.  He will remain hospitalized until the white count improves.  Will consider transition to oral antibiotics tomorrow if he remains afebrile and if cultures remain negative.   LOS: 2 days   Alexander Bussing, NP   02/10/2019, 9:49 AM Alexander Reeves was interviewed and examined.  He appears  stable.  He will continue the current antibiotic regimen.  He will be discharged home when there is a rise in the neutrophil count.  Alexander Manson, MD

## 2019-02-11 ENCOUNTER — Other Ambulatory Visit: Payer: Self-pay | Admitting: Oncology

## 2019-02-11 DIAGNOSIS — C182 Malignant neoplasm of ascending colon: Secondary | ICD-10-CM

## 2019-02-11 LAB — CBC WITH DIFFERENTIAL/PLATELET
Abs Immature Granulocytes: 0.01 10*3/uL (ref 0.00–0.07)
Basophils Absolute: 0 10*3/uL (ref 0.0–0.1)
Basophils Relative: 1 %
Eosinophils Absolute: 0.1 10*3/uL (ref 0.0–0.5)
Eosinophils Relative: 3 %
HCT: 34.6 % — ABNORMAL LOW (ref 39.0–52.0)
Hemoglobin: 10.8 g/dL — ABNORMAL LOW (ref 13.0–17.0)
Immature Granulocytes: 1 %
Lymphocytes Relative: 44 %
Lymphs Abs: 0.7 10*3/uL (ref 0.7–4.0)
MCH: 25.7 pg — ABNORMAL LOW (ref 26.0–34.0)
MCHC: 31.2 g/dL (ref 30.0–36.0)
MCV: 82.2 fL (ref 80.0–100.0)
Monocytes Absolute: 0.4 10*3/uL (ref 0.1–1.0)
Monocytes Relative: 29 %
Neutro Abs: 0.3 10*3/uL — ABNORMAL LOW (ref 1.7–7.7)
Neutrophils Relative %: 22 %
Platelets: 167 10*3/uL (ref 150–400)
RBC: 4.21 MIL/uL — ABNORMAL LOW (ref 4.22–5.81)
RDW: 16.9 % — ABNORMAL HIGH (ref 11.5–15.5)
WBC: 1.5 10*3/uL — ABNORMAL LOW (ref 4.0–10.5)
nRBC: 0 % (ref 0.0–0.2)

## 2019-02-11 LAB — BASIC METABOLIC PANEL
Anion gap: 7 (ref 5–15)
BUN: 9 mg/dL (ref 6–20)
CO2: 26 mmol/L (ref 22–32)
Calcium: 8.9 mg/dL (ref 8.9–10.3)
Chloride: 103 mmol/L (ref 98–111)
Creatinine, Ser: 1.19 mg/dL (ref 0.61–1.24)
GFR calc Af Amer: >60 mL/min (ref >60)
GFR calc non Af Amer: >60 mL/min (ref >60)
Glucose, Bld: 99 mg/dL (ref 70–99)
Potassium: 4 mmol/L (ref 3.5–5.1)
Sodium: 136 mmol/L (ref 135–145)

## 2019-02-11 MED ORDER — HEPARIN SOD (PORK) LOCK FLUSH 100 UNIT/ML IV SOLN
500.0000 [IU] | Freq: Once | INTRAVENOUS | Status: AC
  Administered 2019-02-11: 500 [IU] via INTRAVENOUS
  Filled 2019-02-11: qty 5

## 2019-02-11 MED ORDER — CIPROFLOXACIN HCL 500 MG PO TABS: 500.0000 mg | ORAL_TABLET | Freq: Two times a day (BID) | ORAL | 0 refills | Status: DC

## 2019-02-11 MED ORDER — ONDANSETRON HCL 8 MG PO TABS: 8.0000 mg | ORAL_TABLET | Freq: Three times a day (TID) | ORAL | 1 refills | Status: DC | PRN

## 2019-02-11 MED ORDER — ACETAMINOPHEN 325 MG PO TABS: 650.0000 mg | ORAL_TABLET | Freq: Four times a day (QID) | ORAL | Status: DC | PRN

## 2019-02-11 MED ORDER — PROCHLORPERAZINE MALEATE 10 MG PO TABS: 10.0000 mg | ORAL_TABLET | Freq: Four times a day (QID) | ORAL | 1 refills | Status: DC | PRN

## 2019-02-11 MED FILL — POTASSIUM CL ER 20 MEQ TAB: 20 | 30 days supply | Qty: 30 | Fill #1

## 2019-02-11 MED FILL — PROCHLORPERAZINE 10 MG TAB: 10 | 15 days supply | Qty: 60 | Fill #0

## 2019-02-11 MED FILL — ONDANSETRON HCL 8 MG TABLET: 8 | 10 days supply | Qty: 30 | Fill #0

## 2019-02-11 MED FILL — CIPROFLOXACIN HCL 500 MG TA: 500 | 7 days supply | Qty: 14 | Fill #0

## 2019-02-11 NOTE — Care Management Important Message (Signed)
Important Message  Patient Details IM Letter given to Marney Doctor RN Case Manager to present to the Patient Name: Alexander VANHOOSER Sr. MRN: [id number] Date of Birth: Mar 03, 1965   Medicare Important Message Given:  Yes     Kerin Salen 02/11/2019, 10:10 AM

## 2019-02-11 NOTE — Discharge Summary (Addendum)
Discharge Summary  Patient ID: ANKUR RABBITT Sr. MRN: [id number] DOB/AGE: December 06, 1965 54 y.o.  Admit date: 02/08/2019 Discharge date: 02/11/2019  Discharge Diagnoses:  Active Problems:   Febrile neutropenia (HCC)  Discharged Condition: good  Discharge Labs:    CBC    Component Value Date/Time   WBC 1.5 (L) 02/11/2019 0503   RBC 4.21 (L) 02/11/2019 0503   HGB 10.8 (L) 02/11/2019 0503   HGB 10.3 (L) 02/08/2019 0906   HGB 13.4 10/11/2018 1115   HCT 34.6 (L) 02/11/2019 0503   HCT 41.4 10/11/2018 1115   PLT 167 02/11/2019 0503   PLT 142 (L) 02/08/2019 0906   PLT 170 10/11/2018 1115   MCV 82.2 02/11/2019 0503   MCV 81 10/11/2018 1115   MCH 25.7 (L) 02/11/2019 0503   MCHC 31.2 02/11/2019 0503   RDW 16.9 (H) 02/11/2019 0503   RDW 14.0 10/11/2018 1115   LYMPHSABS 0.7 02/11/2019 0503   LYMPHSABS 1.6 10/11/2018 1115   MONOABS 0.4 02/11/2019 0503   EOSABS 0.1 02/11/2019 0503   EOSABS 0.2 10/11/2018 1115   BASOSABS 0.0 02/11/2019 0503   BASOSABS 0.0 10/11/2018 1115   CMP Latest Ref Rng & Units 02/11/2019 02/09/2019 02/08/2019  Glucose 70 - 99 mg/dL 99 93 98  BUN 6 - 20 mg/dL 9 6 6   Creatinine 0.61 - 1.24 mg/dL 1.19 1.13 1.09  Sodium 135 - 145 mmol/L 136 139 138  Potassium 3.5 - 5.1 mmol/L 4.0 4.0 3.7  Chloride 98 - 111 mmol/L 103 107 106  CO2 22 - 32 mmol/L 26 26 23   Calcium 8.9 - 10.3 mg/dL 8.9 8.9 8.5(L)  Total Protein 6.5 - 8.1 g/dL - - 7.0  Total Bilirubin 0.3 - 1.2 mg/dL - - 0.3  Alkaline Phos 38 - 126 U/L - - 61  AST 15 - 41 U/L - - 29  ALT 0 - 44 U/L - - 30    Consults: None  Procedures: None  Disposition: Discharge disposition: 01-Home or Self Care      Allergies as of 02/11/2019      Reactions   Cheese Shortness Of Breath, Swelling   Swelling of the throat   Eggs Or Egg-derived Products Shortness Of Breath, Swelling   Swelling of the throat   Penicillins Anaphylaxis   Has patient had a PCN reaction causing immediate rash, facial/tongue/throat  swelling, SOB or lightheadedness with hypotension: Yes Has patient had a PCN reaction causing severe rash involving mucus membranes or skin necrosis: Unknown Has patient had a PCN reaction that required hospitalization: Unknown Has patient had a PCN reaction occurring within the last 10 years: Unknown If all of the above answers are "NO", then may proceed with Cephalosporin use.      Medication List    STOP taking these medications   potassium chloride SA 20 MEQ tablet Commonly known as: KLOR-CON     TAKE these medications   acetaminophen 325 MG tablet Commonly known as: TYLENOL Take 2 tablets (650 mg total) by mouth every 6 (six) hours as needed for mild pain (or Fever >/= 101).   albuterol (2.5 MG/3ML) 0.083% nebulizer solution Commonly known as: PROVENTIL Take 3 mLs (2.5 mg total) by nebulization every 6 (six) hours as needed for wheezing or shortness of breath.   albuterol 108 (90 Base) MCG/ACT inhaler Commonly known as: VENTOLIN HFA Inhale 2 puffs into the lungs every 4 (four) hours as needed for wheezing or shortness of breath.   Breo Ellipta 200-25 MCG/INH Aepb Generic drug:  fluticasone furoate-vilanterol Inhale 1 puff into the lungs daily.   ciprofloxacin 500 MG tablet Commonly known as: Cipro Take 1 tablet (500 mg total) by mouth 2 (two) times daily.   fenofibrate 145 MG tablet Commonly known as: Tricor Take 1 tablet (145 mg total) by mouth daily.   lidocaine-prilocaine cream Commonly known as: EMLA Apply 1 application topically as directed. Apply 1 hour prior to stick and cover with plastic wrap   montelukast 10 MG tablet Commonly known as: SINGULAIR Take 1 tablet (10 mg total) by mouth at bedtime.   ondansetron 8 MG tablet Commonly known as: ZOFRAN Take 1 tablet (8 mg total) by mouth every 8 (eight) hours as needed for nausea or vomiting. Start 72 hours after IV chemo treatment day   pantoprazole 40 MG tablet Commonly known as: PROTONIX Take 1 tablet  (40 mg total) by mouth daily.   prochlorperazine 10 MG tablet Commonly known as: COMPAZINE Take 1 tablet (10 mg total) by mouth every 6 (six) hours as needed for nausea.        HPI: Mr. Magness is a 54 year old male with stage IIIb colon cancer diagnosed in November 2020.  The patient has been receiving adjuvant chemotherapy with FOLFOX and completed his third cycle on 01/25/2019.  He presented to the office on 02/08/2019 prior to his fourth cycle of chemotherapy.  At the screening desk he was found to be febrile.  He reported feeling "tired."  He was unaware of the fever prior to coming to the office.  Did not have any shaking chills.  He had stable dyspnea on exertion.  He did not have any cough.  He reported that his mouth and throat were dry.  He denies mucositis.  He was not having any nausea, vomiting, diarrhea.  He denied skin rashes.  He did not have any pain at the Port-A-Cath site.  Abs from that date confirmed neutropenia.  He was admitted to the hospital for further management of his febrile neutropenia.  Hospital Course: Mr. Ellington was admitted to the oncology unit and started on broad-spectrum IV antibiotics with aztreonam and vancomycin.  The patient's fevers resolved and he did not have any shaking chills.  He reports no signs of infection.  Urine culture did not have any growth.  Blood cultures are negative to date.  He has been eating and drinking well.  On the morning of 02/11/2019 he was feeling well.  His white blood cell count and ANC remains low.  Discussed with the patient option of discharging to home with oral antibiotics versus continuing inpatient status.  The patient would like to discharge to home.  The patient was given a prescription for Cipro 500 mg twice daily for 1 week.  He was advised to monitor his temperature and call for a fever of 100.4 or higher, shaking chills, or any other signs of infection.  We will repeat a CBC on Monday, 02/14/2019 and he will have a  follow-up visit on 02/16/2019 for reevaluation and possible fourth cycle of chemotherapy if his white blood cell count improves.  We will plan to add Neulasta or biosimilar with his next cycle of chemotherapy.   Discharge Instructions    Activity as tolerated - No restrictions   Complete by: As directed    Call MD for:   Complete by: As directed    Fever 100.4 or higher, shaking chills, or any other concerns.   Diet general   Complete by: As directed  Signed: Mikey Bussing 02/11/2019, 8:05 AM  Mr. Westby was interviewed and examined on the day of discharge.  He appears stable.  He remains afebrile and there is no apparent infection.  Cultures remain negative.  Neutrophil count remains low.  He will be discharged to home ciprofloxacin.  He was evaluated today with the aid of a sign language interpreter.  He knows to call for any fever.

## 2019-02-13 LAB — CULTURE, BLOOD (SINGLE)
Culture: NO GROWTH
Culture: NO GROWTH
Special Requests: ADEQUATE
Special Requests: ADEQUATE

## 2019-02-14 ENCOUNTER — Telehealth: Payer: Self-pay | Admitting: Nurse Practitioner

## 2019-02-14 ENCOUNTER — Inpatient Hospital Stay: Payer: Medicare Other | Attending: Nurse Practitioner

## 2019-02-14 ENCOUNTER — Other Ambulatory Visit: Payer: Self-pay

## 2019-02-14 ENCOUNTER — Telehealth: Payer: Self-pay | Admitting: *Deleted

## 2019-02-14 ENCOUNTER — Inpatient Hospital Stay: Payer: Medicare Other

## 2019-02-14 VITALS — BP 136/87 | HR 79 | Temp 98.5°F | Resp 18

## 2019-02-14 DIAGNOSIS — T451X5A Adverse effect of antineoplastic and immunosuppressive drugs, initial encounter: Secondary | ICD-10-CM | POA: Insufficient documentation

## 2019-02-14 DIAGNOSIS — C182 Malignant neoplasm of ascending colon: Secondary | ICD-10-CM | POA: Diagnosis not present

## 2019-02-14 DIAGNOSIS — R5081 Fever presenting with conditions classified elsewhere: Secondary | ICD-10-CM | POA: Diagnosis not present

## 2019-02-14 DIAGNOSIS — Z5111 Encounter for antineoplastic chemotherapy: Secondary | ICD-10-CM | POA: Diagnosis not present

## 2019-02-14 DIAGNOSIS — D701 Agranulocytosis secondary to cancer chemotherapy: Secondary | ICD-10-CM | POA: Diagnosis not present

## 2019-02-14 DIAGNOSIS — Z5189 Encounter for other specified aftercare: Secondary | ICD-10-CM | POA: Insufficient documentation

## 2019-02-14 DIAGNOSIS — Z95828 Presence of other vascular implants and grafts: Secondary | ICD-10-CM

## 2019-02-14 DIAGNOSIS — C779 Secondary and unspecified malignant neoplasm of lymph node, unspecified: Secondary | ICD-10-CM | POA: Diagnosis not present

## 2019-02-14 LAB — CBC WITH DIFFERENTIAL (CANCER CENTER ONLY)
Abs Immature Granulocytes: 0.06 10*3/uL (ref 0.00–0.07)
Basophils Absolute: 0 10*3/uL (ref 0.0–0.1)
Basophils Relative: 1 %
Eosinophils Absolute: 0.1 10*3/uL (ref 0.0–0.5)
Eosinophils Relative: 2 %
HCT: 33.3 % — ABNORMAL LOW (ref 39.0–52.0)
Hemoglobin: 10.3 g/dL — ABNORMAL LOW (ref 13.0–17.0)
Immature Granulocytes: 2 %
Lymphocytes Relative: 37 %
Lymphs Abs: 1.2 10*3/uL (ref 0.7–4.0)
MCH: 25.1 pg — ABNORMAL LOW (ref 26.0–34.0)
MCHC: 30.9 g/dL (ref 30.0–36.0)
MCV: 81.2 fL (ref 80.0–100.0)
Monocytes Absolute: 0.6 10*3/uL (ref 0.1–1.0)
Monocytes Relative: 19 %
Neutro Abs: 1.3 10*3/uL — ABNORMAL LOW (ref 1.7–7.7)
Neutrophils Relative %: 39 %
Platelet Count: 220 10*3/uL (ref 150–400)
RBC: 4.1 MIL/uL — ABNORMAL LOW (ref 4.22–5.81)
RDW: 17.2 % — ABNORMAL HIGH (ref 11.5–15.5)
WBC Count: 3.2 10*3/uL — ABNORMAL LOW (ref 4.0–10.5)
nRBC: 0 % (ref 0.0–0.2)

## 2019-02-14 MED ORDER — SODIUM CHLORIDE 0.9% FLUSH
10.0000 mL | INTRAVENOUS | Status: DC | PRN
  Administered 2019-02-14: 12:00:00 10 mL
  Filled 2019-02-14: qty 10

## 2019-02-14 MED ORDER — HEPARIN SOD (PORK) LOCK FLUSH 100 UNIT/ML IV SOLN
500.0000 [IU] | Freq: Once | INTRAVENOUS | Status: AC | PRN
  Administered 2019-02-14: 12:00:00 500 [IU]
  Filled 2019-02-14: qty 5

## 2019-02-14 NOTE — Telephone Encounter (Signed)
Scheduled appt per 1/29 sch msg - pt is aware of appts - emailed transportation urgent message.

## 2019-02-14 NOTE — Telephone Encounter (Signed)
Called pt and message was left through Pharmacist, community. Advised that pt is to keep appts as scheduled.

## 2019-02-16 ENCOUNTER — Inpatient Hospital Stay: Payer: Medicare Other

## 2019-02-16 ENCOUNTER — Inpatient Hospital Stay: Payer: Medicare Other | Admitting: Nurse Practitioner

## 2019-02-18 ENCOUNTER — Inpatient Hospital Stay: Payer: Medicare Other

## 2019-02-21 ENCOUNTER — Other Ambulatory Visit: Payer: Self-pay

## 2019-02-21 ENCOUNTER — Inpatient Hospital Stay: Payer: Medicare Other

## 2019-02-21 ENCOUNTER — Encounter: Payer: Self-pay | Admitting: Nurse Practitioner

## 2019-02-21 ENCOUNTER — Inpatient Hospital Stay (HOSPITAL_BASED_OUTPATIENT_CLINIC_OR_DEPARTMENT_OTHER): Payer: Medicare Other | Admitting: Nurse Practitioner

## 2019-02-21 VITALS — BP 141/86 | HR 89 | Temp 98.3°F | Resp 17 | Ht 70.0 in | Wt 159.8 lb

## 2019-02-21 DIAGNOSIS — Z5111 Encounter for antineoplastic chemotherapy: Secondary | ICD-10-CM | POA: Diagnosis not present

## 2019-02-21 DIAGNOSIS — C182 Malignant neoplasm of ascending colon: Secondary | ICD-10-CM

## 2019-02-21 DIAGNOSIS — D701 Agranulocytosis secondary to cancer chemotherapy: Secondary | ICD-10-CM | POA: Diagnosis not present

## 2019-02-21 DIAGNOSIS — C779 Secondary and unspecified malignant neoplasm of lymph node, unspecified: Secondary | ICD-10-CM | POA: Diagnosis not present

## 2019-02-21 DIAGNOSIS — R5081 Fever presenting with conditions classified elsewhere: Secondary | ICD-10-CM | POA: Diagnosis not present

## 2019-02-21 DIAGNOSIS — Z5189 Encounter for other specified aftercare: Secondary | ICD-10-CM | POA: Diagnosis not present

## 2019-02-21 DIAGNOSIS — Z95828 Presence of other vascular implants and grafts: Secondary | ICD-10-CM

## 2019-02-21 DIAGNOSIS — C189 Malignant neoplasm of colon, unspecified: Secondary | ICD-10-CM

## 2019-02-21 LAB — CMP (CANCER CENTER ONLY)
ALT: 22 U/L (ref 0–44)
AST: 18 U/L (ref 15–41)
Albumin: 4 g/dL (ref 3.5–5.0)
Alkaline Phosphatase: 64 U/L (ref 38–126)
Anion gap: 9 (ref 5–15)
BUN: 12 mg/dL (ref 6–20)
CO2: 25 mmol/L (ref 22–32)
Calcium: 9.1 mg/dL (ref 8.9–10.3)
Chloride: 108 mmol/L (ref 98–111)
Creatinine: 1.11 mg/dL (ref 0.61–1.24)
GFR, Est AFR Am: >60 mL/min (ref >60)
GFR, Estimated: >60 mL/min (ref >60)
Glucose, Bld: 97 mg/dL (ref 70–99)
Potassium: 4.5 mmol/L (ref 3.5–5.1)
Sodium: 142 mmol/L (ref 135–145)
Total Bilirubin: 0.2 mg/dL — ABNORMAL LOW (ref 0.3–1.2)
Total Protein: 7.3 g/dL (ref 6.5–8.1)

## 2019-02-21 LAB — CBC WITH DIFFERENTIAL (CANCER CENTER ONLY)
Abs Immature Granulocytes: 0.06 10*3/uL (ref 0.00–0.07)
Basophils Absolute: 0.1 10*3/uL (ref 0.0–0.1)
Basophils Relative: 1 %
Eosinophils Absolute: 0.1 10*3/uL (ref 0.0–0.5)
Eosinophils Relative: 1 %
HCT: 34.7 % — ABNORMAL LOW (ref 39.0–52.0)
Hemoglobin: 10.6 g/dL — ABNORMAL LOW (ref 13.0–17.0)
Immature Granulocytes: 1 %
Lymphocytes Relative: 28 %
Lymphs Abs: 1.6 10*3/uL (ref 0.7–4.0)
MCH: 24.7 pg — ABNORMAL LOW (ref 26.0–34.0)
MCHC: 30.5 g/dL (ref 30.0–36.0)
MCV: 80.9 fL (ref 80.0–100.0)
Monocytes Absolute: 0.7 10*3/uL (ref 0.1–1.0)
Monocytes Relative: 11 %
Neutro Abs: 3.4 10*3/uL (ref 1.7–7.7)
Neutrophils Relative %: 58 %
Platelet Count: 270 10*3/uL (ref 150–400)
RBC: 4.29 MIL/uL (ref 4.22–5.81)
RDW: 18 % — ABNORMAL HIGH (ref 11.5–15.5)
WBC Count: 5.9 10*3/uL (ref 4.0–10.5)
nRBC: 0 % (ref 0.0–0.2)

## 2019-02-21 MED ORDER — SODIUM CHLORIDE 0.9% FLUSH
10.0000 mL | INTRAVENOUS | Status: DC | PRN
  Filled 2019-02-21: qty 10

## 2019-02-21 MED ORDER — OXALIPLATIN CHEMO INJECTION 100 MG/20ML
65.0000 mg/m2 | Freq: Once | INTRAVENOUS | Status: AC
  Administered 2019-02-21: 125 mg via INTRAVENOUS
  Filled 2019-02-21: qty 25

## 2019-02-21 MED ORDER — DEXTROSE 5 % IV SOLN
Freq: Once | INTRAVENOUS | Status: AC
  Filled 2019-02-21: qty 250

## 2019-02-21 MED ORDER — HEPARIN SOD (PORK) LOCK FLUSH 100 UNIT/ML IV SOLN
500.0000 [IU] | Freq: Once | INTRAVENOUS | Status: DC | PRN
  Filled 2019-02-21: qty 5

## 2019-02-21 MED ORDER — DEXAMETHASONE SODIUM PHOSPHATE 10 MG/ML IJ SOLN
INTRAMUSCULAR | Status: AC
  Filled 2019-02-21: qty 1

## 2019-02-21 MED ORDER — DEXAMETHASONE SODIUM PHOSPHATE 10 MG/ML IJ SOLN
10.0000 mg | Freq: Once | INTRAMUSCULAR | Status: AC
  Administered 2019-02-21: 10 mg via INTRAVENOUS

## 2019-02-21 MED ORDER — SODIUM CHLORIDE 0.9% FLUSH
10.0000 mL | INTRAVENOUS | Status: DC | PRN
  Administered 2019-02-21: 12:00:00 10 mL
  Filled 2019-02-21: qty 10

## 2019-02-21 MED ORDER — FLUOROURACIL CHEMO INJECTION 2.5 GM/50ML
400.0000 mg/m2 | Freq: Once | INTRAVENOUS | Status: AC
  Administered 2019-02-21: 750 mg via INTRAVENOUS
  Filled 2019-02-21: qty 15

## 2019-02-21 MED ORDER — LEUCOVORIN CALCIUM INJECTION 350 MG
400.0000 mg/m2 | Freq: Once | INTRAVENOUS | Status: AC
  Administered 2019-02-21: 756 mg via INTRAVENOUS
  Filled 2019-02-21: qty 37.8

## 2019-02-21 MED ORDER — PALONOSETRON HCL INJECTION 0.25 MG/5ML
0.2500 mg | Freq: Once | INTRAVENOUS | Status: AC
  Administered 2019-02-21: 13:00:00 0.25 mg via INTRAVENOUS

## 2019-02-21 MED ORDER — SODIUM CHLORIDE 0.9 % IV SOLN
2400.0000 mg/m2 | INTRAVENOUS | Status: DC
  Administered 2019-02-21: 4550 mg via INTRAVENOUS
  Filled 2019-02-21: qty 91

## 2019-02-21 MED ORDER — PALONOSETRON HCL INJECTION 0.25 MG/5ML
INTRAVENOUS | Status: AC
  Filled 2019-02-21: qty 5

## 2019-02-21 NOTE — Patient Instructions (Signed)
[facility] Discharge Instructions for Patients Receiving Chemotherapy  Today you received the following chemotherapy agents: oxaliplatin, leucovorin, and 5FU.  To help prevent nausea and vomiting after your treatment, we encourage you to take your nausea medication as directed.   If you develop nausea and vomiting that is not controlled by your nausea medication, call the clinic.   BELOW ARE SYMPTOMS THAT SHOULD BE REPORTED IMMEDIATELY:  *FEVER GREATER THAN 100.5 F  *CHILLS WITH OR WITHOUT FEVER  NAUSEA AND VOMITING THAT IS NOT CONTROLLED WITH YOUR NAUSEA MEDICATION  *UNUSUAL SHORTNESS OF BREATH  *UNUSUAL BRUISING OR BLEEDING  TENDERNESS IN MOUTH AND THROAT WITH OR WITHOUT PRESENCE OF ULCERS  *URINARY PROBLEMS  *BOWEL PROBLEMS  UNUSUAL RASH Items with * indicate a potential emergency and should be followed up as soon as possible.  Feel free to call the clinic should you have any questions or concerns. The clinic phone number is [phone].  Please show the [facility] at check-in to the Emergency Department and triage nurse.

## 2019-02-21 NOTE — Progress Notes (Signed)
  [facility] OFFICE PROGRESS NOTE   Diagnosis: Colon cancer  INTERVAL HISTORY:   Alexander Reeves returns as scheduled.  He completed cycle 3 FOLFOX on 01/25/2019.  Cycle 4 was held 02/08/2019 due to febrile neutropenia.  He was admitted to the hospital.  Cultures remained negative.  He was discharged on 02/11/2019.  He denies nausea/vomiting.  No mouth sores.  He had some diarrhea after leaving the hospital.  This has resolved.  No further fever.  Objective:  Vital signs in last 24 hours:  Blood pressure (!) 141/86, pulse 89, temperature 98.3 F (36.8 C), temperature source Temporal, resp. rate 17, height '5\' 10"'$  (1.778 m), weight 159 lb 12.8 oz (72.5 kg), SpO2 100 %.    HEENT: No thrush or ulcers. GI: Abdomen soft and nontender.  No hepatomegaly. Vascular: No leg edema.  Skin: Palms with mild hyperpigmentation. Port-A-Cath without erythema.   Lab Results:  Lab Results  Component Value Date   WBC 5.9 02/21/2019   HGB 10.6 (L) 02/21/2019   HCT 34.7 (L) 02/21/2019   MCV 80.9 02/21/2019   PLT 270 02/21/2019   NEUTROABS 3.4 02/21/2019    Imaging:  No results found.  Medications: I have reviewed the patient's current medications.  Assessment/Plan: 1. Moderately differentiated adenocarcinoma ascending colon, stage IIIb (pT3pN1), status post a right colectomy 11/19/2018 ? Lymphovascular and perineural invasion present, 2/14 lymph nodes positive, tumor deposits present ? Positive radial margin, no loss of mismatch repair protein expression ? Colonoscopy 11/19/2018-completely obstructing mid ascending colon mass, could not be passed with endoscope, biopsy confirmed invasive adenocarcinoma ? CT abdomen/pelvis 11/18/2018-wall thickening at the mid and distal ascending colon with mild distention of the proximal ascending colon and cecum ? CTs 10/17/2018--no acute findings, no chest lymphadenopathy, lungs clear ? Cycle 1 FOLFOX 12/28/2018 ? Cycle 2 FOLFOX 01/11/2019,  Udenyca added  ? Cycle 3 FOLFOX 01/25/2019, Udenyca held due to bone pain ? Cycle 4 FOLFOX 02/21/2019, Udenyca added  2. Deaf 3. Right epididymal cyst removal 03/22/2018 4. Asthma 5. Port-A-Cath placement, [name], 12/23/2018 6. Neutropenia secondary to chemotherapy-Udenyca added for cycle 2 FOLFOX 7. Admission with febrile neutropenia 02/08/2019  Disposition: Alexander Reeves appears stable.  He has completed 3 cycles of FOLFOX.  Cycle 4 was held on 02/08/2019 due to febrile neutropenia.  Blood counts have recovered.  Plan to proceed with cycle 4 today as scheduled.  He will receive Udenyca on the day of pump discontinuation.  We reviewed the CBC from today.  Counts are adequate to proceed with treatment.  He agrees to receive Udenyca with subsequent cycles.  He will return for lab, follow-up, cycle 5 FOLFOX in 2 weeks.  He will contact the office in the interim with any problems.  Plan reviewed with Dr. Benay Spice.  Ned Card ANP/GNP-BC   02/21/2019  11:58 AM

## 2019-02-23 ENCOUNTER — Other Ambulatory Visit: Payer: Self-pay

## 2019-02-23 ENCOUNTER — Inpatient Hospital Stay: Payer: Medicare Other

## 2019-02-23 VITALS — BP 122/73 | HR 90 | Temp 98.1°F | Resp 18

## 2019-02-23 DIAGNOSIS — Z5189 Encounter for other specified aftercare: Secondary | ICD-10-CM | POA: Diagnosis not present

## 2019-02-23 DIAGNOSIS — Z5111 Encounter for antineoplastic chemotherapy: Secondary | ICD-10-CM | POA: Diagnosis not present

## 2019-02-23 DIAGNOSIS — R5081 Fever presenting with conditions classified elsewhere: Secondary | ICD-10-CM | POA: Diagnosis not present

## 2019-02-23 DIAGNOSIS — C779 Secondary and unspecified malignant neoplasm of lymph node, unspecified: Secondary | ICD-10-CM | POA: Diagnosis not present

## 2019-02-23 DIAGNOSIS — C189 Malignant neoplasm of colon, unspecified: Secondary | ICD-10-CM

## 2019-02-23 DIAGNOSIS — D701 Agranulocytosis secondary to cancer chemotherapy: Secondary | ICD-10-CM | POA: Diagnosis not present

## 2019-02-23 DIAGNOSIS — C182 Malignant neoplasm of ascending colon: Secondary | ICD-10-CM | POA: Diagnosis not present

## 2019-02-23 MED ORDER — HEPARIN SOD (PORK) LOCK FLUSH 100 UNIT/ML IV SOLN
500.0000 [IU] | Freq: Once | INTRAVENOUS | Status: AC | PRN
  Administered 2019-02-23: 500 [IU]
  Filled 2019-02-23: qty 5

## 2019-02-23 MED ORDER — SODIUM CHLORIDE 0.9% FLUSH
10.0000 mL | INTRAVENOUS | Status: DC | PRN
  Administered 2019-02-23: 10 mL
  Filled 2019-02-23: qty 10

## 2019-02-23 MED ORDER — PEGFILGRASTIM-CBQV 6 MG/0.6ML ~~LOC~~ SOSY
6.0000 mg | PREFILLED_SYRINGE | Freq: Once | SUBCUTANEOUS | Status: AC
  Administered 2019-02-23: 14:00:00 6 mg via SUBCUTANEOUS

## 2019-02-23 MED ORDER — PEGFILGRASTIM-CBQV 6 MG/0.6ML ~~LOC~~ SOSY
PREFILLED_SYRINGE | SUBCUTANEOUS | Status: AC
  Filled 2019-02-23: qty 0.6

## 2019-02-27 ENCOUNTER — Other Ambulatory Visit: Payer: Self-pay | Admitting: Oncology

## 2019-03-07 ENCOUNTER — Telehealth: Payer: Self-pay | Admitting: Nurse Practitioner

## 2019-03-07 ENCOUNTER — Inpatient Hospital Stay: Payer: Medicare Other

## 2019-03-07 ENCOUNTER — Telehealth: Payer: Self-pay

## 2019-03-07 ENCOUNTER — Inpatient Hospital Stay: Payer: Medicare Other | Admitting: Nurse Practitioner

## 2019-03-07 NOTE — Telephone Encounter (Signed)
Scheduled appt per 2/22 sch message - unable to reach pt . Left message with appt date and time  [id number] - interpreter ID -

## 2019-03-07 NOTE — Telephone Encounter (Signed)
Spoke with patient today regarding his appointments today. Pt verbalizes understanding that scheduling department will call him to reschedule his appointments to 03/09/19.

## 2019-03-09 ENCOUNTER — Inpatient Hospital Stay: Payer: Medicare Other

## 2019-03-09 ENCOUNTER — Other Ambulatory Visit: Payer: Self-pay

## 2019-03-09 ENCOUNTER — Encounter: Payer: Self-pay | Admitting: Nurse Practitioner

## 2019-03-09 ENCOUNTER — Inpatient Hospital Stay (HOSPITAL_BASED_OUTPATIENT_CLINIC_OR_DEPARTMENT_OTHER): Payer: Medicare Other | Admitting: Nurse Practitioner

## 2019-03-09 ENCOUNTER — Telehealth: Payer: Self-pay | Admitting: Nurse Practitioner

## 2019-03-09 VITALS — BP 140/98 | HR 87 | Temp 98.2°F | Resp 17 | Ht 70.0 in | Wt 158.1 lb

## 2019-03-09 VITALS — BP 131/100 | HR 83

## 2019-03-09 DIAGNOSIS — C182 Malignant neoplasm of ascending colon: Secondary | ICD-10-CM | POA: Diagnosis not present

## 2019-03-09 DIAGNOSIS — C189 Malignant neoplasm of colon, unspecified: Secondary | ICD-10-CM

## 2019-03-09 DIAGNOSIS — Z5111 Encounter for antineoplastic chemotherapy: Secondary | ICD-10-CM | POA: Diagnosis not present

## 2019-03-09 DIAGNOSIS — Z95828 Presence of other vascular implants and grafts: Secondary | ICD-10-CM

## 2019-03-09 DIAGNOSIS — Z760 Encounter for issue of repeat prescription: Secondary | ICD-10-CM | POA: Diagnosis not present

## 2019-03-09 DIAGNOSIS — C779 Secondary and unspecified malignant neoplasm of lymph node, unspecified: Secondary | ICD-10-CM | POA: Diagnosis not present

## 2019-03-09 DIAGNOSIS — Z5189 Encounter for other specified aftercare: Secondary | ICD-10-CM | POA: Diagnosis not present

## 2019-03-09 DIAGNOSIS — D701 Agranulocytosis secondary to cancer chemotherapy: Secondary | ICD-10-CM | POA: Diagnosis not present

## 2019-03-09 DIAGNOSIS — R5081 Fever presenting with conditions classified elsewhere: Secondary | ICD-10-CM | POA: Diagnosis not present

## 2019-03-09 LAB — CMP (CANCER CENTER ONLY)
ALT: 14 U/L (ref 0–44)
AST: 16 U/L (ref 15–41)
Albumin: 4 g/dL (ref 3.5–5.0)
Alkaline Phosphatase: 70 U/L (ref 38–126)
Anion gap: 9 (ref 5–15)
BUN: 8 mg/dL (ref 6–20)
CO2: 24 mmol/L (ref 22–32)
Calcium: 9.2 mg/dL (ref 8.9–10.3)
Chloride: 108 mmol/L (ref 98–111)
Creatinine: 1.2 mg/dL (ref 0.61–1.24)
GFR, Est AFR Am: >60 mL/min (ref >60)
GFR, Estimated: >60 mL/min (ref >60)
Glucose, Bld: 98 mg/dL (ref 70–99)
Potassium: 4.3 mmol/L (ref 3.5–5.1)
Sodium: 141 mmol/L (ref 135–145)
Total Bilirubin: 0.3 mg/dL (ref 0.3–1.2)
Total Protein: 6.9 g/dL (ref 6.5–8.1)

## 2019-03-09 LAB — CBC WITH DIFFERENTIAL (CANCER CENTER ONLY)
Abs Immature Granulocytes: 0.17 10*3/uL — ABNORMAL HIGH (ref 0.00–0.07)
Basophils Absolute: 0 10*3/uL (ref 0.0–0.1)
Basophils Relative: 1 %
Eosinophils Absolute: 0.1 10*3/uL (ref 0.0–0.5)
Eosinophils Relative: 2 %
HCT: 36.9 % — ABNORMAL LOW (ref 39.0–52.0)
Hemoglobin: 11.1 g/dL — ABNORMAL LOW (ref 13.0–17.0)
Immature Granulocytes: 3 %
Lymphocytes Relative: 24 %
Lymphs Abs: 1.3 10*3/uL (ref 0.7–4.0)
MCH: 24.8 pg — ABNORMAL LOW (ref 26.0–34.0)
MCHC: 30.1 g/dL (ref 30.0–36.0)
MCV: 82.4 fL (ref 80.0–100.0)
Monocytes Absolute: 0.5 10*3/uL (ref 0.1–1.0)
Monocytes Relative: 9 %
Neutro Abs: 3.3 10*3/uL (ref 1.7–7.7)
Neutrophils Relative %: 61 %
Platelet Count: 122 10*3/uL — ABNORMAL LOW (ref 150–400)
RBC: 4.48 MIL/uL (ref 4.22–5.81)
RDW: 18.7 % — ABNORMAL HIGH (ref 11.5–15.5)
WBC Count: 5.4 10*3/uL (ref 4.0–10.5)
nRBC: 0 % (ref 0.0–0.2)

## 2019-03-09 MED ORDER — PALONOSETRON HCL INJECTION 0.25 MG/5ML
0.2500 mg | Freq: Once | INTRAVENOUS | Status: AC
  Administered 2019-03-09: 14:00:00 0.25 mg via INTRAVENOUS

## 2019-03-09 MED ORDER — ALBUTEROL SULFATE (2.5 MG/3ML) 0.083% IN NEBU: 2.5000 mg | INHALATION_SOLUTION | Freq: Four times a day (QID) | RESPIRATORY_TRACT | 12 refills | Status: DC | PRN

## 2019-03-09 MED ORDER — SODIUM CHLORIDE 0.9 % IV SOLN
2400.0000 mg/m2 | INTRAVENOUS | Status: DC
  Administered 2019-03-09: 4550 mg via INTRAVENOUS
  Filled 2019-03-09: qty 91

## 2019-03-09 MED ORDER — SODIUM CHLORIDE 0.9% FLUSH
10.0000 mL | INTRAVENOUS | Status: DC | PRN
  Administered 2019-03-09: 10 mL
  Filled 2019-03-09: qty 10

## 2019-03-09 MED ORDER — DEXTROSE 5 % IV SOLN
Freq: Once | INTRAVENOUS | Status: AC
  Filled 2019-03-09: qty 250

## 2019-03-09 MED ORDER — DEXAMETHASONE SODIUM PHOSPHATE 10 MG/ML IJ SOLN
10.0000 mg | Freq: Once | INTRAMUSCULAR | Status: AC
  Administered 2019-03-09: 14:00:00 10 mg via INTRAVENOUS

## 2019-03-09 MED ORDER — DEXAMETHASONE SODIUM PHOSPHATE 10 MG/ML IJ SOLN
INTRAMUSCULAR | Status: AC
  Filled 2019-03-09: qty 1

## 2019-03-09 MED ORDER — PANTOPRAZOLE SODIUM 40 MG PO TBEC: 40.0000 mg | DELAYED_RELEASE_TABLET | Freq: Every day | ORAL | 3 refills | Status: DC

## 2019-03-09 MED ORDER — FLUOROURACIL CHEMO INJECTION 2.5 GM/50ML
400.0000 mg/m2 | Freq: Once | INTRAVENOUS | Status: AC
  Administered 2019-03-09: 750 mg via INTRAVENOUS
  Filled 2019-03-09: qty 15

## 2019-03-09 MED ORDER — PALONOSETRON HCL INJECTION 0.25 MG/5ML
INTRAVENOUS | Status: AC
  Filled 2019-03-09: qty 5

## 2019-03-09 MED ORDER — OXALIPLATIN CHEMO INJECTION 100 MG/20ML
65.0000 mg/m2 | Freq: Once | INTRAVENOUS | Status: AC
  Administered 2019-03-09: 14:00:00 125 mg via INTRAVENOUS
  Filled 2019-03-09: qty 20

## 2019-03-09 MED ORDER — LEUCOVORIN CALCIUM INJECTION 350 MG
400.0000 mg/m2 | Freq: Once | INTRAVENOUS | Status: AC
  Administered 2019-03-09: 14:00:00 756 mg via INTRAVENOUS
  Filled 2019-03-09: qty 37.8

## 2019-03-09 MED FILL — PANTOPRAZOLE SOD DR 40 MG T: 40 | 30 days supply | Qty: 30 | Fill #0

## 2019-03-09 NOTE — Progress Notes (Signed)
Requested refill on his K+ 20 meq. Per Ned Card, NP--K+ is normal. Will not refill at this time and f/u in 2 weeks w/repeat CMP. He understands and agrees. Per pharmacy at [facility] medication he has not been able to fill requires it be filled under his Medicare B plan at a local pharmacy. Script sent to [facility] as requested.

## 2019-03-09 NOTE — Progress Notes (Addendum)
  [facility] OFFICE PROGRESS NOTE   Diagnosis: Colon cancer  INTERVAL HISTORY:   Alexander Reeves returns for follow-up.  He completed cycle 4 FOLFOX 02/21/2019.  He had mild nausea for a few days.  No mouth sores.  No diarrhea.  Cold sensitivity lasted 2 or 3 days.  No persistent neuropathy symptoms.  No bleeding.  He has a good appetite.  Objective:  Vital signs in last 24 hours:  Blood pressure (!) 140/98, pulse 87, temperature 98.2 F (36.8 C), temperature source Temporal, resp. rate 17, height _0  (1.778 m), weight 158 lb 1.6 oz (71.7 kg), SpO2 100 %.    HEENT: White coating of her tongue.  No buccal thrush.  No ulcers. GI: Abdomen soft and nontender.  No hepatosplenomegaly. Vascular: No leg edema. Neuro: Vibratory sense intact over the fingertips per tuning fork exam. Skin: Palms with hyperpigmentation.  No erythema.  No skin breakdown. Port-A-Cath without erythema.   Lab Results:  Lab Results  Component Value Date   WBC 5.4 03/09/2019   HGB 11.1 (L) 03/09/2019   HCT 36.9 (L) 03/09/2019   MCV 82.4 03/09/2019   PLT 122 (L) 03/09/2019   NEUTROABS 3.3 03/09/2019    Imaging:  No results found.  Medications: I have reviewed the patient's current medications.  Assessment/Plan: 1. Moderately differentiated adenocarcinoma ascending colon, stage IIIb (pT3pN1), status post a right colectomy 11/19/2018 ? Lymphovascular and perineural invasion present, 2/14 lymph nodes positive, tumor deposits present ? Positive radial margin, no loss of mismatch repair protein expression ? Colonoscopy 11/19/2018-completely obstructing mid ascending colon mass, could not be passed with endoscope, biopsy confirmed invasive adenocarcinoma ? CT abdomen/pelvis 11/18/2018-wall thickening at the mid and distal ascending colon with mild distention of the proximal ascending colon and cecum ? CTs 10/17/2018--no acute findings, no chest lymphadenopathy, lungs clear ? Cycle 1 FOLFOX  12/28/2018 ? Cycle 2 FOLFOX 01/11/2019, Udenyca added ? Cycle 3 FOLFOX 01/25/2019, Udenyca held due to bone pain ? Cycle 4 FOLFOX 02/21/2019, Udenyca added ? Cycle 5 FOLFOX 03/09/2019, Udenyca  2. Deaf 3. Right epididymal cyst removal 03/22/2018 4. Asthma 5. Port-A-Cath placement, [name], 12/23/2018 6. Neutropenia secondary to chemotherapy-Udenyca added for cycle 2 FOLFOX 7. Admission with febrile neutropenia 02/08/2019   Disposition: Alexander Reeves appears stable.  He has completed 4 cycles of FOLFOX.  He is tolerating treatment well.  Plan to proceed with cycle 5 today as scheduled.  He will again receive Udenyca on the day of pump discontinuation.  We reviewed the CBC from today.  Counts adequate to proceed as above.  He has mild thrombocytopenia.  He understands to contact the office with any bleeding.  He will return for lab, follow-up, cycle 6 FOLFOX in 2 weeks.  He will contact the office in the interim as outlined above or with any other problems.  Patient seen with Dr. Benay Spice.    Ned Card ANP/GNP-BC   03/09/2019  12:24 PM This was a shared visit with Ned Card.  Alexander Reeves continues to tolerate the chemotherapy well.  He will complete cycle 5 of adjuvant FOLFOX today.  Julieanne Manson, MD

## 2019-03-09 NOTE — Telephone Encounter (Signed)
Scheduled appt per 2/24 los - gave patient AVS and calender per los

## 2019-03-09 NOTE — Patient Instructions (Signed)
[facility] Discharge Instructions for Patients Receiving Chemotherapy  Today you received the following chemotherapy agents Oxaliplatin, Leucovorin and Adrucil.  To help prevent nausea and vomiting after your treatment, we encourage you to take your nausea medication as directed BUT NO ZOFRAN FOR 3 DAYS    If you develop nausea and vomiting that is not controlled by your nausea medication, call the clinic.   BELOW ARE SYMPTOMS THAT SHOULD BE REPORTED IMMEDIATELY:  *FEVER GREATER THAN 100.5 F  *CHILLS WITH OR WITHOUT FEVER  NAUSEA AND VOMITING THAT IS NOT CONTROLLED WITH YOUR NAUSEA MEDICATION  *UNUSUAL SHORTNESS OF BREATH  *UNUSUAL BRUISING OR BLEEDING  TENDERNESS IN MOUTH AND THROAT WITH OR WITHOUT PRESENCE OF ULCERS  *URINARY PROBLEMS  *BOWEL PROBLEMS  UNUSUAL RASH Items with * indicate a potential emergency and should be followed up as soon as possible.  Feel free to call the clinic you have any questions or concerns. The clinic phone number is [phone].  Please show the CHEMO ALERT CARD at check-in to the Emergency Department and triage nurse.    

## 2019-03-09 NOTE — Progress Notes (Signed)
OK to treat per LISA NP with BP today

## 2019-03-11 ENCOUNTER — Inpatient Hospital Stay: Payer: Medicare Other

## 2019-03-11 ENCOUNTER — Other Ambulatory Visit: Payer: Self-pay

## 2019-03-11 VITALS — BP 132/88 | HR 78 | Temp 98.2°F | Resp 18

## 2019-03-11 DIAGNOSIS — D701 Agranulocytosis secondary to cancer chemotherapy: Secondary | ICD-10-CM | POA: Diagnosis not present

## 2019-03-11 DIAGNOSIS — Z5189 Encounter for other specified aftercare: Secondary | ICD-10-CM | POA: Diagnosis not present

## 2019-03-11 DIAGNOSIS — Z5111 Encounter for antineoplastic chemotherapy: Secondary | ICD-10-CM | POA: Diagnosis not present

## 2019-03-11 DIAGNOSIS — C189 Malignant neoplasm of colon, unspecified: Secondary | ICD-10-CM

## 2019-03-11 DIAGNOSIS — C779 Secondary and unspecified malignant neoplasm of lymph node, unspecified: Secondary | ICD-10-CM | POA: Diagnosis not present

## 2019-03-11 DIAGNOSIS — C182 Malignant neoplasm of ascending colon: Secondary | ICD-10-CM | POA: Diagnosis not present

## 2019-03-11 DIAGNOSIS — R5081 Fever presenting with conditions classified elsewhere: Secondary | ICD-10-CM | POA: Diagnosis not present

## 2019-03-11 MED ORDER — SODIUM CHLORIDE 0.9% FLUSH
10.0000 mL | INTRAVENOUS | Status: DC | PRN
  Administered 2019-03-11: 15:00:00 10 mL
  Filled 2019-03-11: qty 10

## 2019-03-11 MED ORDER — PEGFILGRASTIM-CBQV 6 MG/0.6ML ~~LOC~~ SOSY
6.0000 mg | PREFILLED_SYRINGE | Freq: Once | SUBCUTANEOUS | Status: AC
  Administered 2019-03-11: 15:00:00 6 mg via SUBCUTANEOUS

## 2019-03-11 MED ORDER — PEGFILGRASTIM-CBQV 6 MG/0.6ML ~~LOC~~ SOSY
PREFILLED_SYRINGE | SUBCUTANEOUS | Status: AC
  Filled 2019-03-11: qty 0.6

## 2019-03-11 MED ORDER — HEPARIN SOD (PORK) LOCK FLUSH 100 UNIT/ML IV SOLN
500.0000 [IU] | Freq: Once | INTRAVENOUS | Status: AC | PRN
  Administered 2019-03-11: 15:00:00 500 [IU]
  Filled 2019-03-11: qty 5

## 2019-03-11 NOTE — Patient Instructions (Signed)

## 2019-03-17 MED FILL — ALBUTEROL SULFATE HFA 108 (: 108 (90 BAS | 16 days supply | Qty: 18 | Fill #1

## 2019-03-20 ENCOUNTER — Other Ambulatory Visit: Payer: Self-pay | Admitting: Oncology

## 2019-03-21 ENCOUNTER — Other Ambulatory Visit: Payer: Self-pay | Admitting: Oncology

## 2019-03-21 ENCOUNTER — Inpatient Hospital Stay: Payer: Medicare Other | Attending: Nurse Practitioner | Admitting: Oncology

## 2019-03-21 ENCOUNTER — Inpatient Hospital Stay: Payer: Medicare Other

## 2019-03-21 ENCOUNTER — Other Ambulatory Visit: Payer: Self-pay

## 2019-03-21 VITALS — BP 140/94 | HR 85 | Temp 98.9°F | Resp 17 | Ht 70.0 in | Wt 156.0 lb

## 2019-03-21 DIAGNOSIS — C182 Malignant neoplasm of ascending colon: Secondary | ICD-10-CM | POA: Insufficient documentation

## 2019-03-21 DIAGNOSIS — C189 Malignant neoplasm of colon, unspecified: Secondary | ICD-10-CM | POA: Diagnosis not present

## 2019-03-21 DIAGNOSIS — C779 Secondary and unspecified malignant neoplasm of lymph node, unspecified: Secondary | ICD-10-CM | POA: Insufficient documentation

## 2019-03-21 DIAGNOSIS — Z5189 Encounter for other specified aftercare: Secondary | ICD-10-CM | POA: Diagnosis not present

## 2019-03-21 DIAGNOSIS — Z5111 Encounter for antineoplastic chemotherapy: Secondary | ICD-10-CM | POA: Diagnosis not present

## 2019-03-21 LAB — CBC WITH DIFFERENTIAL (CANCER CENTER ONLY)
Abs Immature Granulocytes: 0.04 10*3/uL (ref 0.00–0.07)
Basophils Absolute: 0 10*3/uL (ref 0.0–0.1)
Basophils Relative: 1 %
Eosinophils Absolute: 0.1 10*3/uL (ref 0.0–0.5)
Eosinophils Relative: 1 %
HCT: 33.4 % — ABNORMAL LOW (ref 39.0–52.0)
Hemoglobin: 10.3 g/dL — ABNORMAL LOW (ref 13.0–17.0)
Immature Granulocytes: 1 %
Lymphocytes Relative: 24 %
Lymphs Abs: 1 10*3/uL (ref 0.7–4.0)
MCH: 25 pg — ABNORMAL LOW (ref 26.0–34.0)
MCHC: 30.8 g/dL (ref 30.0–36.0)
MCV: 81.1 fL (ref 80.0–100.0)
Monocytes Absolute: 0.6 10*3/uL (ref 0.1–1.0)
Monocytes Relative: 14 %
Neutro Abs: 2.5 10*3/uL (ref 1.7–7.7)
Neutrophils Relative %: 59 %
Platelet Count: 175 10*3/uL (ref 150–400)
RBC: 4.12 MIL/uL — ABNORMAL LOW (ref 4.22–5.81)
RDW: 19.1 % — ABNORMAL HIGH (ref 11.5–15.5)
WBC Count: 4.2 10*3/uL (ref 4.0–10.5)
nRBC: 0 % (ref 0.0–0.2)

## 2019-03-21 LAB — CMP (CANCER CENTER ONLY)
ALT: 13 U/L (ref 0–44)
AST: 17 U/L (ref 15–41)
Albumin: 4.1 g/dL (ref 3.5–5.0)
Alkaline Phosphatase: 77 U/L (ref 38–126)
Anion gap: 7 (ref 5–15)
BUN: 12 mg/dL (ref 6–20)
CO2: 24 mmol/L (ref 22–32)
Calcium: 8.8 mg/dL — ABNORMAL LOW (ref 8.9–10.3)
Chloride: 112 mmol/L — ABNORMAL HIGH (ref 98–111)
Creatinine: 1.22 mg/dL (ref 0.61–1.24)
GFR, Est AFR Am: >60 mL/min (ref >60)
GFR, Estimated: >60 mL/min (ref >60)
Glucose, Bld: 104 mg/dL — ABNORMAL HIGH (ref 70–99)
Potassium: 3.8 mmol/L (ref 3.5–5.1)
Sodium: 143 mmol/L (ref 135–145)
Total Bilirubin: <0.2 mg/dL — ABNORMAL LOW (ref 0.3–1.2)
Total Protein: 6.9 g/dL (ref 6.5–8.1)

## 2019-03-21 MED ORDER — LEUCOVORIN CALCIUM INJECTION 350 MG
400.0000 mg/m2 | Freq: Once | INTRAVENOUS | Status: AC
  Administered 2019-03-21: 756 mg via INTRAVENOUS
  Filled 2019-03-21: qty 37.8

## 2019-03-21 MED ORDER — SODIUM CHLORIDE 0.9 % IV SOLN
2400.0000 mg/m2 | INTRAVENOUS | Status: DC
  Administered 2019-03-21: 4550 mg via INTRAVENOUS
  Filled 2019-03-21: qty 91

## 2019-03-21 MED ORDER — PALONOSETRON HCL INJECTION 0.25 MG/5ML
0.2500 mg | Freq: Once | INTRAVENOUS | Status: AC
  Administered 2019-03-21: 0.25 mg via INTRAVENOUS

## 2019-03-21 MED ORDER — PALONOSETRON HCL INJECTION 0.25 MG/5ML
INTRAVENOUS | Status: AC
  Filled 2019-03-21: qty 5

## 2019-03-21 MED ORDER — OXALIPLATIN CHEMO INJECTION 100 MG/20ML
65.0000 mg/m2 | Freq: Once | INTRAVENOUS | Status: AC
  Administered 2019-03-21: 125 mg via INTRAVENOUS
  Filled 2019-03-21: qty 10

## 2019-03-21 MED ORDER — DEXAMETHASONE SODIUM PHOSPHATE 10 MG/ML IJ SOLN
10.0000 mg | Freq: Once | INTRAMUSCULAR | Status: AC
  Administered 2019-03-21: 10 mg via INTRAVENOUS

## 2019-03-21 MED ORDER — DEXTROSE 5 % IV SOLN
Freq: Once | INTRAVENOUS | Status: AC
  Filled 2019-03-21: qty 250

## 2019-03-21 MED ORDER — DEXAMETHASONE SODIUM PHOSPHATE 10 MG/ML IJ SOLN
INTRAMUSCULAR | Status: AC
  Filled 2019-03-21: qty 1

## 2019-03-21 MED ORDER — FLUOROURACIL CHEMO INJECTION 2.5 GM/50ML
400.0000 mg/m2 | Freq: Once | INTRAVENOUS | Status: AC
  Administered 2019-03-21: 750 mg via INTRAVENOUS
  Filled 2019-03-21: qty 15

## 2019-03-21 NOTE — Progress Notes (Signed)
  [facility] OFFICE PROGRESS NOTE   Diagnosis: Colon cancer  INTERVAL HISTORY:   Alexander Reeves returns as scheduled.  He completed another cycle of FOLFOX on 03/09/2019.  No nausea or diarrhea.  He had mild cold sensitivity and peripheral numbness following chemotherapy.  This has resolved.  He feels well.  Good appetite.  Objective:  Vital signs in last 24 hours:  Blood pressure (!) 140/94, pulse 85, temperature 98.9 F (37.2 C), temperature source Temporal, resp. rate 17, height '5\' 10"'$  (1.778 m), weight 156 lb (70.8 kg), SpO2 100 %.    HEENT: No thrush or ulcers GI: No hepatosplenomegaly, no mass, nontender Vascular: No leg edema Neuro: Mild to moderate loss of vibratory sense at the fingertips bilaterally Skin: Mild hyperpigmentation of the hands  Portacath/PICC-without erythema  Lab Results:  Lab Results  Component Value Date   WBC 4.2 03/21/2019   HGB 10.3 (L) 03/21/2019   HCT 33.4 (L) 03/21/2019   MCV 81.1 03/21/2019   PLT 175 03/21/2019   NEUTROABS 2.5 03/21/2019    CMP  Lab Results  Component Value Date   NA 141 03/09/2019   K 4.3 03/09/2019   CL 108 03/09/2019   CO2 24 03/09/2019   GLUCOSE 98 03/09/2019   BUN 8 03/09/2019   CREATININE 1.20 03/09/2019   CALCIUM 9.2 03/09/2019   PROT 6.9 03/09/2019   ALBUMIN 4.0 03/09/2019   AST 16 03/09/2019   ALT 14 03/09/2019   ALKPHOS 70 03/09/2019   BILITOT 0.3 03/09/2019   GFRNONAA >60 03/09/2019   GFRAA >60 03/09/2019    Lab Results  Component Value Date   CEA1 2.7 11/18/2018     Medications: I have reviewed the patient's current medications.   Assessment/Plan: 1. Moderately differentiated adenocarcinoma ascending colon, stage IIIb (pT3pN1), status post a right colectomy 11/19/2018 ? Lymphovascular and perineural invasion present, 2/14 lymph nodes positive, tumor deposits present ? Positive radial margin, no loss of mismatch repair protein expression ? Colonoscopy 11/19/2018-completely  obstructing mid ascending colon mass, could not be passed with endoscope, biopsy confirmed invasive adenocarcinoma ? CT abdomen/pelvis 11/18/2018-wall thickening at the mid and distal ascending colon with mild distention of the proximal ascending colon and cecum ? CTs 10/17/2018--no acute findings, no chest lymphadenopathy, lungs clear ? Cycle 1 FOLFOX 12/28/2018 ? Cycle 2 FOLFOX 01/11/2019, Udenyca added ? Cycle 3 FOLFOX 01/25/2019, Udenyca held due to bone pain ? Cycle 4 FOLFOX 02/21/2019, Udenyca added ? Cycle 5 FOLFOX 03/09/2019, Udenyca ? Cycle 6 FOLFOX 03/21/2019, Udenyca  2. Deaf 3. Right epididymal cyst removal 03/22/2018 4. Asthma 5. Port-A-Cath placement, [name], 12/23/2018 6. Neutropenia secondary to chemotherapy-Udenyca added for cycle 2 FOLFOX 7. 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  He continues to tolerate the chemotherapy well.  He is developing early oxaliplatin neuropathy.  We will decrease the oxaliplatin if the neuropathy symptoms progress.  He will complete cycle 7 FOLFOX today.  Alexander Reeves will return for an office visit and chemotherapy in 2 weeks.  Betsy Coder, MD  03/21/2019  8:59 AM

## 2019-03-21 NOTE — Patient Instructions (Signed)
[facility] Discharge Instructions for Patients Receiving Chemotherapy  Today you received the following chemotherapy agents Oxaliplatin, Leucovorin and Adrucil.  To help prevent nausea and vomiting after your treatment, we encourage you to take your nausea medication as directed BUT NO ZOFRAN FOR 3 DAYS    If you develop nausea and vomiting that is not controlled by your nausea medication, call the clinic.   BELOW ARE SYMPTOMS THAT SHOULD BE REPORTED IMMEDIATELY:  *FEVER GREATER THAN 100.5 F  *CHILLS WITH OR WITHOUT FEVER  NAUSEA AND VOMITING THAT IS NOT CONTROLLED WITH YOUR NAUSEA MEDICATION  *UNUSUAL SHORTNESS OF BREATH  *UNUSUAL BRUISING OR BLEEDING  TENDERNESS IN MOUTH AND THROAT WITH OR WITHOUT PRESENCE OF ULCERS  *URINARY PROBLEMS  *BOWEL PROBLEMS  UNUSUAL RASH Items with * indicate a potential emergency and should be followed up as soon as possible.  Feel free to call the clinic you have any questions or concerns. The clinic phone number is [phone].  Please show the CHEMO ALERT CARD at check-in to the Emergency Department and triage nurse.    

## 2019-03-22 ENCOUNTER — Telehealth: Payer: Self-pay | Admitting: Oncology

## 2019-03-22 NOTE — Telephone Encounter (Signed)
Scheduled per los. Called and left msg. Mailed printout  °

## 2019-03-23 ENCOUNTER — Inpatient Hospital Stay: Payer: Medicare Other

## 2019-03-23 ENCOUNTER — Other Ambulatory Visit: Payer: Self-pay

## 2019-03-23 VITALS — BP 110/96 | HR 79 | Temp 98.5°F | Resp 18

## 2019-03-23 DIAGNOSIS — Z5189 Encounter for other specified aftercare: Secondary | ICD-10-CM | POA: Diagnosis not present

## 2019-03-23 DIAGNOSIS — C779 Secondary and unspecified malignant neoplasm of lymph node, unspecified: Secondary | ICD-10-CM | POA: Diagnosis not present

## 2019-03-23 DIAGNOSIS — Z5111 Encounter for antineoplastic chemotherapy: Secondary | ICD-10-CM | POA: Diagnosis not present

## 2019-03-23 DIAGNOSIS — C189 Malignant neoplasm of colon, unspecified: Secondary | ICD-10-CM

## 2019-03-23 DIAGNOSIS — C182 Malignant neoplasm of ascending colon: Secondary | ICD-10-CM | POA: Diagnosis not present

## 2019-03-23 MED ORDER — PEGFILGRASTIM-CBQV 6 MG/0.6ML ~~LOC~~ SOSY
6.0000 mg | PREFILLED_SYRINGE | Freq: Once | SUBCUTANEOUS | Status: AC
  Administered 2019-03-23: 6 mg via SUBCUTANEOUS

## 2019-03-23 MED ORDER — PEGFILGRASTIM-CBQV 6 MG/0.6ML ~~LOC~~ SOSY
PREFILLED_SYRINGE | SUBCUTANEOUS | Status: AC
  Filled 2019-03-23: qty 0.6

## 2019-03-23 MED ORDER — HEPARIN SOD (PORK) LOCK FLUSH 100 UNIT/ML IV SOLN
500.0000 [IU] | Freq: Once | INTRAVENOUS | Status: AC | PRN
  Administered 2019-03-23: 10:00:00 500 [IU]
  Filled 2019-03-23: qty 5

## 2019-03-23 MED ORDER — SODIUM CHLORIDE 0.9% FLUSH
10.0000 mL | INTRAVENOUS | Status: DC | PRN
  Administered 2019-03-23: 10 mL
  Filled 2019-03-23: qty 10

## 2019-03-23 NOTE — Progress Notes (Signed)
Pt came in early requesting pump to be taken off. Pump taken off with 9.7 infusion left.

## 2019-03-23 NOTE — Patient Instructions (Signed)

## 2019-04-03 ENCOUNTER — Other Ambulatory Visit: Payer: Self-pay | Admitting: Oncology

## 2019-04-04 ENCOUNTER — Inpatient Hospital Stay: Payer: Medicare Other

## 2019-04-04 ENCOUNTER — Other Ambulatory Visit: Payer: Self-pay

## 2019-04-04 ENCOUNTER — Inpatient Hospital Stay (HOSPITAL_BASED_OUTPATIENT_CLINIC_OR_DEPARTMENT_OTHER): Payer: Medicare Other | Admitting: Oncology

## 2019-04-04 VITALS — BP 156/79 | HR 95 | Temp 98.7°F | Resp 17 | Ht 70.0 in | Wt 153.8 lb

## 2019-04-04 DIAGNOSIS — C189 Malignant neoplasm of colon, unspecified: Secondary | ICD-10-CM

## 2019-04-04 DIAGNOSIS — Z5111 Encounter for antineoplastic chemotherapy: Secondary | ICD-10-CM | POA: Diagnosis not present

## 2019-04-04 DIAGNOSIS — C779 Secondary and unspecified malignant neoplasm of lymph node, unspecified: Secondary | ICD-10-CM | POA: Diagnosis not present

## 2019-04-04 DIAGNOSIS — Z5189 Encounter for other specified aftercare: Secondary | ICD-10-CM | POA: Diagnosis not present

## 2019-04-04 DIAGNOSIS — C182 Malignant neoplasm of ascending colon: Secondary | ICD-10-CM | POA: Diagnosis not present

## 2019-04-04 LAB — CMP (CANCER CENTER ONLY)
ALT: 25 U/L (ref 0–44)
AST: 23 U/L (ref 15–41)
Albumin: 4 g/dL (ref 3.5–5.0)
Alkaline Phosphatase: 89 U/L (ref 38–126)
Anion gap: 7 (ref 5–15)
BUN: 12 mg/dL (ref 6–20)
CO2: 25 mmol/L (ref 22–32)
Calcium: 9.1 mg/dL (ref 8.9–10.3)
Chloride: 110 mmol/L (ref 98–111)
Creatinine: 1.39 mg/dL — ABNORMAL HIGH (ref 0.61–1.24)
GFR, Est AFR Am: >60 mL/min (ref >60)
GFR, Estimated: 58 mL/min — ABNORMAL LOW (ref >60)
Glucose, Bld: 93 mg/dL (ref 70–99)
Potassium: 4 mmol/L (ref 3.5–5.1)
Sodium: 142 mmol/L (ref 135–145)
Total Bilirubin: 0.2 mg/dL — ABNORMAL LOW (ref 0.3–1.2)
Total Protein: 6.8 g/dL (ref 6.5–8.1)

## 2019-04-04 LAB — CBC WITH DIFFERENTIAL (CANCER CENTER ONLY)
Abs Immature Granulocytes: 0.76 10*3/uL — ABNORMAL HIGH (ref 0.00–0.07)
Basophils Absolute: 0.1 10*3/uL (ref 0.0–0.1)
Basophils Relative: 1 %
Eosinophils Absolute: 0.1 10*3/uL (ref 0.0–0.5)
Eosinophils Relative: 1 %
HCT: 34.8 % — ABNORMAL LOW (ref 39.0–52.0)
Hemoglobin: 10.7 g/dL — ABNORMAL LOW (ref 13.0–17.0)
Immature Granulocytes: 9 %
Lymphocytes Relative: 21 %
Lymphs Abs: 1.8 10*3/uL (ref 0.7–4.0)
MCH: 25.2 pg — ABNORMAL LOW (ref 26.0–34.0)
MCHC: 30.7 g/dL (ref 30.0–36.0)
MCV: 82.1 fL (ref 80.0–100.0)
Monocytes Absolute: 0.9 10*3/uL (ref 0.1–1.0)
Monocytes Relative: 10 %
Neutro Abs: 5 10*3/uL (ref 1.7–7.7)
Neutrophils Relative %: 58 %
Platelet Count: 143 10*3/uL — ABNORMAL LOW (ref 150–400)
RBC: 4.24 MIL/uL (ref 4.22–5.81)
RDW: 20.3 % — ABNORMAL HIGH (ref 11.5–15.5)
WBC Count: 8.6 10*3/uL (ref 4.0–10.5)
nRBC: 0.6 % — ABNORMAL HIGH (ref 0.0–0.2)

## 2019-04-04 MED ORDER — PALONOSETRON HCL INJECTION 0.25 MG/5ML
0.2500 mg | Freq: Once | INTRAVENOUS | Status: AC
  Administered 2019-04-04: 0.25 mg via INTRAVENOUS

## 2019-04-04 MED ORDER — FLUOROURACIL CHEMO INJECTION 2.5 GM/50ML
400.0000 mg/m2 | Freq: Once | INTRAVENOUS | Status: AC
  Administered 2019-04-04: 13:00:00 750 mg via INTRAVENOUS
  Filled 2019-04-04: qty 15

## 2019-04-04 MED ORDER — DEXTROSE 5 % IV SOLN
Freq: Once | INTRAVENOUS | Status: AC
  Filled 2019-04-04: qty 250

## 2019-04-04 MED ORDER — OXALIPLATIN CHEMO INJECTION 100 MG/20ML
65.0000 mg/m2 | Freq: Once | INTRAVENOUS | Status: AC
  Administered 2019-04-04: 125 mg via INTRAVENOUS
  Filled 2019-04-04: qty 20

## 2019-04-04 MED ORDER — SODIUM CHLORIDE 0.9 % IV SOLN
2400.0000 mg/m2 | INTRAVENOUS | Status: DC
  Administered 2019-04-04: 4550 mg via INTRAVENOUS
  Filled 2019-04-04: qty 91

## 2019-04-04 MED ORDER — PROCHLORPERAZINE MALEATE 10 MG PO TABS: 10.0000 mg | ORAL_TABLET | Freq: Four times a day (QID) | ORAL | 1 refills | Status: DC | PRN

## 2019-04-04 MED ORDER — PALONOSETRON HCL INJECTION 0.25 MG/5ML
INTRAVENOUS | Status: AC
  Filled 2019-04-04: qty 5

## 2019-04-04 MED ORDER — DEXAMETHASONE SODIUM PHOSPHATE 10 MG/ML IJ SOLN
10.0000 mg | Freq: Once | INTRAMUSCULAR | Status: AC
  Administered 2019-04-04: 10 mg via INTRAVENOUS

## 2019-04-04 MED ORDER — SODIUM CHLORIDE 0.9 % IV SOLN: 10.0000 mg | Freq: Once | INTRAVENOUS | Status: DC

## 2019-04-04 MED ORDER — LEUCOVORIN CALCIUM INJECTION 350 MG
400.0000 mg/m2 | Freq: Once | INTRAVENOUS | Status: AC
  Administered 2019-04-04: 10:00:00 756 mg via INTRAVENOUS
  Filled 2019-04-04: qty 37.8

## 2019-04-04 MED ORDER — DEXAMETHASONE SODIUM PHOSPHATE 10 MG/ML IJ SOLN
INTRAMUSCULAR | Status: AC
  Filled 2019-04-04: qty 1

## 2019-04-04 MED FILL — PROCHLORPERAZINE 10 MG TAB: 10 | 15 days supply | Qty: 60 | Fill #0

## 2019-04-04 NOTE — Progress Notes (Signed)
  [place] OFFICE PROGRESS NOTE   Diagnosis: Colon cancer  INTERVAL HISTORY:   Alexander Reeves completed another cycle of FOLFOX on 03/21/2019.  No nausea, mouth sores, or diarrhea.  He reports cold sensitivity.  He has mild numbness in the left fingers.  This does not interfere with activity.  He had discomfort in the left tricep.  This has resolved.  Good appetite.  Mild musculoskeletal pain following the Neulasta injection. He reports decreased visual acuity.  He has noted increased tearing. Objective:  Vital signs in last 24 hours:  Blood pressure (!) 156/79, pulse 95, temperature 98.7 F (37.1 C), temperature source Temporal, resp. rate 17, height '5\' 10"'$  (1.778 m), weight 153 lb 12.8 oz (69.8 kg), SpO2 100 %.   HEENT: Conjunctiva without erythema  resp: Lungs clear bilaterally Cardio: Regular rate and rhythm GI: No hepatosplenomegaly, nontender Vascular: No leg edema Neuro: Mild loss of vibratory sense at the fingertips bilaterally Skin: Hyperpigmentation of the hands  Portacath/PICC-without erythema  Lab Results:  Lab Results  Component Value Date   WBC 8.6 04/04/2019   HGB 10.7 (L) 04/04/2019   HCT 34.8 (L) 04/04/2019   MCV 82.1 04/04/2019   PLT 143 (L) 04/04/2019   NEUTROABS 5.0 04/04/2019    CMP  Lab Results  Component Value Date   NA 143 03/21/2019   K 3.8 03/21/2019   CL 112 (H) 03/21/2019   CO2 24 03/21/2019   GLUCOSE 104 (H) 03/21/2019   BUN 12 03/21/2019   CREATININE 1.22 03/21/2019   CALCIUM 8.8 (L) 03/21/2019   PROT 6.9 03/21/2019   ALBUMIN 4.1 03/21/2019   AST 17 03/21/2019   ALT 13 03/21/2019   ALKPHOS 77 03/21/2019   BILITOT <0.2 (L) 03/21/2019   GFRNONAA >60 03/21/2019   GFRAA >60 03/21/2019    Lab Results  Component Value Date   CEA1 2.7 11/18/2018   Medications: I have reviewed the patient's current medications.   Assessment/Plan:  1. Moderately differentiated adenocarcinoma ascending colon, stage IIIb (pT3pN1),  status post a right colectomy 11/19/2018 ? Lymphovascular and perineural invasion present, 2/14 lymph nodes positive, tumor deposits present ? Positive radial margin, no loss of mismatch repair protein expression ? Colonoscopy 11/19/2018-completely obstructing mid ascending colon mass, could not be passed with endoscope, biopsy confirmed invasive adenocarcinoma ? CT abdomen/pelvis 11/18/2018-wall thickening at the mid and distal ascending colon with mild distention of the proximal ascending colon and cecum ? CTs 10/17/2018--no acute findings, no chest lymphadenopathy, lungs clear ? Cycle 1 FOLFOX 12/28/2018 ? Cycle 2 FOLFOX 01/11/2019, Udenyca added ? Cycle 3 FOLFOX 01/25/2019, Udenyca held due to bone pain ? Cycle 4 FOLFOX 02/21/2019, Udenyca added ? Cycle 5 FOLFOX 03/09/2019, Udenyca ? Cycle 6 FOLFOX 03/21/2019, Udenyca ? Cycle 7 FOLFOX 04/04/2019, Udenyca  2. Deaf 3. Right epididymal cyst removal 03/22/2018 4. Asthma 5. Port-A-Cath placement, [name], 12/23/2018 6. Neutropenia secondary to chemotherapy-Udenyca added for cycle 2 FOLFOX 7. 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  He has mild oxaliplatin neuropathy symptoms.  The plan is to proceed with cycle 7 FOLFOX today.  He will return for an office visit and chemotherapy in 2 weeks. The decreased visual acuity is likely related to chemotherapy.  He will call for progressive symptoms.  Alexander Reeves does not wish to take the COVID-19 vaccine.  Betsy Coder, MD  04/04/2019  9:16 AM

## 2019-04-04 NOTE — Patient Instructions (Signed)
[facility] Discharge Instructions for Patients Receiving Chemotherapy  Today you received the following chemotherapy agents Oxaliplatin, Leucovorin and Adrucil.  To help prevent nausea and vomiting after your treatment, we encourage you to take your nausea medication as directed BUT NO ZOFRAN FOR 3 DAYS    If you develop nausea and vomiting that is not controlled by your nausea medication, call the clinic.   BELOW ARE SYMPTOMS THAT SHOULD BE REPORTED IMMEDIATELY:  *FEVER GREATER THAN 100.5 F  *CHILLS WITH OR WITHOUT FEVER  NAUSEA AND VOMITING THAT IS NOT CONTROLLED WITH YOUR NAUSEA MEDICATION  *UNUSUAL SHORTNESS OF BREATH  *UNUSUAL BRUISING OR BLEEDING  TENDERNESS IN MOUTH AND THROAT WITH OR WITHOUT PRESENCE OF ULCERS  *URINARY PROBLEMS  *BOWEL PROBLEMS  UNUSUAL RASH Items with * indicate a potential emergency and should be followed up as soon as possible.  Feel free to call the clinic you have any questions or concerns. The clinic phone number is [phone].  Please show the CHEMO ALERT CARD at check-in to the Emergency Department and triage nurse.    

## 2019-04-05 ENCOUNTER — Telehealth: Payer: Self-pay | Admitting: Nurse Practitioner

## 2019-04-05 NOTE — Telephone Encounter (Signed)
Scheduled per 3/22 los. Mailing printout  

## 2019-04-06 ENCOUNTER — Other Ambulatory Visit: Payer: Self-pay

## 2019-04-06 ENCOUNTER — Inpatient Hospital Stay: Payer: Medicare Other

## 2019-04-06 VITALS — BP 142/72 | HR 78 | Temp 98.2°F | Resp 18

## 2019-04-06 DIAGNOSIS — C189 Malignant neoplasm of colon, unspecified: Secondary | ICD-10-CM

## 2019-04-06 DIAGNOSIS — Z5111 Encounter for antineoplastic chemotherapy: Secondary | ICD-10-CM | POA: Diagnosis not present

## 2019-04-06 DIAGNOSIS — Z5189 Encounter for other specified aftercare: Secondary | ICD-10-CM | POA: Diagnosis not present

## 2019-04-06 DIAGNOSIS — C779 Secondary and unspecified malignant neoplasm of lymph node, unspecified: Secondary | ICD-10-CM | POA: Diagnosis not present

## 2019-04-06 DIAGNOSIS — C182 Malignant neoplasm of ascending colon: Secondary | ICD-10-CM | POA: Diagnosis not present

## 2019-04-06 MED ORDER — HEPARIN SOD (PORK) LOCK FLUSH 100 UNIT/ML IV SOLN
500.0000 [IU] | Freq: Once | INTRAVENOUS | Status: DC | PRN
  Filled 2019-04-06: qty 5

## 2019-04-06 MED ORDER — PEGFILGRASTIM-CBQV 6 MG/0.6ML ~~LOC~~ SOSY
6.0000 mg | PREFILLED_SYRINGE | Freq: Once | SUBCUTANEOUS | Status: AC
  Administered 2019-04-06: 6 mg via SUBCUTANEOUS

## 2019-04-06 MED ORDER — SODIUM CHLORIDE 0.9% FLUSH
10.0000 mL | INTRAVENOUS | Status: DC | PRN
  Filled 2019-04-06: qty 10

## 2019-04-06 MED ORDER — PEGFILGRASTIM-CBQV 6 MG/0.6ML ~~LOC~~ SOSY
PREFILLED_SYRINGE | SUBCUTANEOUS | Status: AC
  Filled 2019-04-06: qty 0.6

## 2019-04-06 MED FILL — MONTELUKAST SOD 10 MG TAB: 10 | 90 days supply | Qty: 90 | Fill #2

## 2019-04-06 MED FILL — VENTOLIN HFA 90 MCG INHALER: 108 (90 BAS | 16 days supply | Qty: 18 | Fill #2

## 2019-04-12 NOTE — Progress Notes (Signed)
Pharmacist Chemotherapy Monitoring - Follow Up Assessment    I verify that I have reviewed each item in the below checklist:  . Regimen for the patient is scheduled for the appropriate day and plan matches scheduled date. [name] Appropriate non-routine labs are ordered dependent on drug ordered. . If applicable, additional medications reviewed and ordered per protocol based on lifetime cumulative doses and/or treatment regimen.   Plan for follow-up and/or issues identified: No . I-vent associated with next due treatment: No . MD and/or nursing notified: No    Kennith Center, Pharm.D., CPP 04/12/2019@1 :08 PM

## 2019-04-17 ENCOUNTER — Other Ambulatory Visit: Payer: Self-pay | Admitting: Oncology

## 2019-04-18 ENCOUNTER — Inpatient Hospital Stay: Payer: Medicare Other | Attending: Nurse Practitioner | Admitting: Nurse Practitioner

## 2019-04-18 ENCOUNTER — Inpatient Hospital Stay: Payer: Medicare Other

## 2019-04-18 ENCOUNTER — Other Ambulatory Visit: Payer: Self-pay

## 2019-04-18 ENCOUNTER — Encounter: Payer: Self-pay | Admitting: Nurse Practitioner

## 2019-04-18 VITALS — BP 141/98 | HR 85 | Temp 98.9°F | Resp 18 | Ht 70.0 in | Wt 154.7 lb

## 2019-04-18 DIAGNOSIS — C189 Malignant neoplasm of colon, unspecified: Secondary | ICD-10-CM | POA: Diagnosis not present

## 2019-04-18 DIAGNOSIS — Z5111 Encounter for antineoplastic chemotherapy: Secondary | ICD-10-CM | POA: Insufficient documentation

## 2019-04-18 DIAGNOSIS — C779 Secondary and unspecified malignant neoplasm of lymph node, unspecified: Secondary | ICD-10-CM | POA: Insufficient documentation

## 2019-04-18 DIAGNOSIS — C182 Malignant neoplasm of ascending colon: Secondary | ICD-10-CM | POA: Diagnosis not present

## 2019-04-18 DIAGNOSIS — D701 Agranulocytosis secondary to cancer chemotherapy: Secondary | ICD-10-CM | POA: Insufficient documentation

## 2019-04-18 DIAGNOSIS — Z95828 Presence of other vascular implants and grafts: Secondary | ICD-10-CM

## 2019-04-18 LAB — CBC WITH DIFFERENTIAL (CANCER CENTER ONLY)
Abs Immature Granulocytes: 0.78 10*3/uL — ABNORMAL HIGH (ref 0.00–0.07)
Basophils Absolute: 0 10*3/uL (ref 0.0–0.1)
Basophils Relative: 1 %
Eosinophils Absolute: 0.1 10*3/uL (ref 0.0–0.5)
Eosinophils Relative: 1 %
HCT: 34.7 % — ABNORMAL LOW (ref 39.0–52.0)
Hemoglobin: 10.6 g/dL — ABNORMAL LOW (ref 13.0–17.0)
Immature Granulocytes: 9 %
Lymphocytes Relative: 17 %
Lymphs Abs: 1.5 10*3/uL (ref 0.7–4.0)
MCH: 26 pg (ref 26.0–34.0)
MCHC: 30.5 g/dL (ref 30.0–36.0)
MCV: 85.3 fL (ref 80.0–100.0)
Monocytes Absolute: 0.8 10*3/uL (ref 0.1–1.0)
Monocytes Relative: 10 %
Neutro Abs: 5.5 10*3/uL (ref 1.7–7.7)
Neutrophils Relative %: 62 %
Platelet Count: 137 10*3/uL — ABNORMAL LOW (ref 150–400)
RBC: 4.07 MIL/uL — ABNORMAL LOW (ref 4.22–5.81)
RDW: 21.5 % — ABNORMAL HIGH (ref 11.5–15.5)
WBC Count: 8.7 10*3/uL (ref 4.0–10.5)
nRBC: 0.7 % — ABNORMAL HIGH (ref 0.0–0.2)

## 2019-04-18 LAB — CMP (CANCER CENTER ONLY)
ALT: 28 U/L (ref 0–44)
AST: 24 U/L (ref 15–41)
Albumin: 3.9 g/dL (ref 3.5–5.0)
Alkaline Phosphatase: 91 U/L (ref 38–126)
Anion gap: 9 (ref 5–15)
BUN: 9 mg/dL (ref 6–20)
CO2: 23 mmol/L (ref 22–32)
Calcium: 8.9 mg/dL (ref 8.9–10.3)
Chloride: 110 mmol/L (ref 98–111)
Creatinine: 1.13 mg/dL (ref 0.61–1.24)
GFR, Est AFR Am: >60 mL/min (ref >60)
GFR, Estimated: >60 mL/min (ref >60)
Glucose, Bld: 91 mg/dL (ref 70–99)
Potassium: 4.1 mmol/L (ref 3.5–5.1)
Sodium: 142 mmol/L (ref 135–145)
Total Bilirubin: 0.3 mg/dL (ref 0.3–1.2)
Total Protein: 6.9 g/dL (ref 6.5–8.1)

## 2019-04-18 MED ORDER — HEPARIN SOD (PORK) LOCK FLUSH 100 UNIT/ML IV SOLN
500.0000 [IU] | Freq: Once | INTRAVENOUS | Status: DC | PRN
  Filled 2019-04-18: qty 5

## 2019-04-18 MED ORDER — PALONOSETRON HCL INJECTION 0.25 MG/5ML
INTRAVENOUS | Status: AC
  Filled 2019-04-18: qty 5

## 2019-04-18 MED ORDER — DEXAMETHASONE SODIUM PHOSPHATE 10 MG/ML IJ SOLN
INTRAMUSCULAR | Status: AC
  Filled 2019-04-18: qty 1

## 2019-04-18 MED ORDER — FLUOROURACIL CHEMO INJECTION 2.5 GM/50ML
400.0000 mg/m2 | Freq: Once | INTRAVENOUS | Status: AC
  Administered 2019-04-18: 750 mg via INTRAVENOUS
  Filled 2019-04-18: qty 15

## 2019-04-18 MED ORDER — LEUCOVORIN CALCIUM INJECTION 350 MG
400.0000 mg/m2 | Freq: Once | INTRAVENOUS | Status: AC
  Administered 2019-04-18: 756 mg via INTRAVENOUS
  Filled 2019-04-18: qty 37.8

## 2019-04-18 MED ORDER — SODIUM CHLORIDE 0.9 % IV SOLN
10.0000 mg | Freq: Once | INTRAVENOUS | Status: AC
  Administered 2019-04-18: 10 mg via INTRAVENOUS
  Filled 2019-04-18: qty 10

## 2019-04-18 MED ORDER — SODIUM CHLORIDE 0.9% FLUSH
10.0000 mL | INTRAVENOUS | Status: DC | PRN
  Administered 2019-04-18: 10:00:00 10 mL
  Filled 2019-04-18: qty 10

## 2019-04-18 MED ORDER — OXALIPLATIN CHEMO INJECTION 100 MG/20ML
65.0000 mg/m2 | Freq: Once | INTRAVENOUS | Status: AC
  Administered 2019-04-18: 125 mg via INTRAVENOUS
  Filled 2019-04-18: qty 20

## 2019-04-18 MED ORDER — SODIUM CHLORIDE 0.9% FLUSH
10.0000 mL | INTRAVENOUS | Status: DC | PRN
  Filled 2019-04-18: qty 10

## 2019-04-18 MED ORDER — DEXTROSE 5 % IV SOLN
Freq: Once | INTRAVENOUS | Status: AC
  Filled 2019-04-18: qty 250

## 2019-04-18 MED ORDER — SODIUM CHLORIDE 0.9 % IV SOLN
2400.0000 mg/m2 | INTRAVENOUS | Status: DC
  Administered 2019-04-18: 4550 mg via INTRAVENOUS
  Filled 2019-04-18: qty 91

## 2019-04-18 MED ORDER — PALONOSETRON HCL INJECTION 0.25 MG/5ML
0.2500 mg | Freq: Once | INTRAVENOUS | Status: AC
  Administered 2019-04-18: 0.25 mg via INTRAVENOUS

## 2019-04-18 MED ORDER — PANTOPRAZOLE SODIUM 40 MG PO TBEC: 40.0000 mg | DELAYED_RELEASE_TABLET | Freq: Every day | ORAL | 3 refills | Status: DC

## 2019-04-18 MED FILL — PANTOPRAZOLE SOD DR 40 MG T: 40 | 90 days supply | Qty: 90 | Fill #0

## 2019-04-18 NOTE — Patient Instructions (Signed)
[facility] Discharge Instructions for Patients Receiving Chemotherapy  Today you received the following chemotherapy agents: Oxaliplatin, leucovorin, 5FU   To help prevent nausea and vomiting after your treatment, we encourage you to take your nausea medication as directed.    If you develop nausea and vomiting that is not controlled by your nausea medication, call the clinic.   BELOW ARE SYMPTOMS THAT SHOULD BE REPORTED IMMEDIATELY:  *FEVER GREATER THAN 100.5 F  *CHILLS WITH OR WITHOUT FEVER  NAUSEA AND VOMITING THAT IS NOT CONTROLLED WITH YOUR NAUSEA MEDICATION  *UNUSUAL SHORTNESS OF BREATH  *UNUSUAL BRUISING OR BLEEDING  TENDERNESS IN MOUTH AND THROAT WITH OR WITHOUT PRESENCE OF ULCERS  *URINARY PROBLEMS  *BOWEL PROBLEMS  UNUSUAL RASH Items with * indicate a potential emergency and should be followed up as soon as possible.  Feel free to call the clinic should you have any questions or concerns. The clinic phone number is [phone].  Please show the CHEMO ALERT CARD at check-in to the Emergency Department and triage nurse.   

## 2019-04-18 NOTE — Progress Notes (Signed)
  [place] OFFICE PROGRESS NOTE   Diagnosis: Colon cancer  INTERVAL HISTORY:   Alexander Reeves returns as scheduled.  He completed cycle 7 FOLFOX 04/04/2019.  He denies nausea/vomiting.  No mouth sores.  No significant diarrhea.  He notes tingling of the left index finger.  No numbness or tingling otherwise.  Several days ago he had an episode of low back pain.  Objective:  Vital signs in last 24 hours:  Blood pressure (!) 141/98, pulse 85, temperature 98.9 F (37.2 C), temperature source Temporal, resp. rate 18, height 5' 10" (1.778 m), weight 154 lb 11.2 oz (70.2 kg), SpO2 100 %.    HEENT: Mild white coating over tongue.  No buccal thrush.  No ulcers. GI: Abdomen soft and nontender.  No hepatomegaly. Vascular: No leg edema. Neuro: Vibratory sense mildly decreased over the fingertips per tuning fork exam. Skin: Palms with hyperpigmentation, dryness, skin thickening. Port-A-Cath without erythema.   Lab Results:  Lab Results  Component Value Date   WBC 8.7 04/18/2019   HGB 10.6 (L) 04/18/2019   HCT 34.7 (L) 04/18/2019   MCV 85.3 04/18/2019   PLT 137 (L) 04/18/2019   NEUTROABS 5.5 04/18/2019    Imaging:  No results found.  Medications: I have reviewed the patient's current medications.  Assessment/Plan: 1. Moderately differentiated adenocarcinoma ascending colon, stage IIIb (pT3pN1), status post a right colectomy 11/19/2018 ? Lymphovascular and perineural invasion present, 2/14 lymph nodes positive, tumor deposits present ? Positive radial margin, no loss of mismatch repair protein expression ? Colonoscopy 11/19/2018-completely obstructing mid ascending colon mass, could not be passed with endoscope, biopsy confirmed invasive adenocarcinoma ? CT abdomen/pelvis 11/18/2018-wall thickening at the mid and distal ascending colon with mild distention of the proximal ascending colon and cecum ? CTs 10/17/2018--no acute findings, no chest lymphadenopathy, lungs  clear ? Cycle 1 FOLFOX 12/28/2018 ? Cycle 2 FOLFOX 01/11/2019, Udenyca added ? Cycle 3 FOLFOX 01/25/2019,Udenyca held due to bone pain ? Cycle 4 FOLFOX 02/21/2019, Udenyca added ? Cycle 5 FOLFOX 03/09/2019, Udenyca ? Cycle 6 FOLFOX 03/21/2019, Udenyca ? Cycle 7 FOLFOX 04/04/2019, Udenyca ? Cycle 8 FOLFOX 04/18/2019, Udenyca  2. Deaf 3. Right epididymal cyst removal 03/22/2018 4. Asthma 5. Port-A-Cath placement, [name], 12/23/2018 6. Neutropenia secondary to chemotherapy-Udenyca added for cycle 2 FOLFOX 7. 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 Main appears stable.  He has completed 7 cycles of adjuvant FOLFOX.  He continues to tolerate treatment well.  Plan to proceed with cycle 8 today as scheduled.  He will again receive Udenyca on the day of pump discontinuation.  We reviewed the CBC from today.  Counts adequate to proceed with treatment.  He will return for lab, follow-up, cycle 9 FOLFOX in 2 weeks.  He will contact the office in the interim with any problems.      Ned Card ANP/GNP-BC   04/18/2019  11:27 AM

## 2019-04-20 ENCOUNTER — Inpatient Hospital Stay: Payer: Medicare Other

## 2019-04-20 ENCOUNTER — Telehealth: Payer: Self-pay | Admitting: Oncology

## 2019-04-20 ENCOUNTER — Other Ambulatory Visit: Payer: Self-pay

## 2019-04-20 VITALS — BP 128/72 | HR 78 | Temp 98.2°F | Resp 18

## 2019-04-20 DIAGNOSIS — C779 Secondary and unspecified malignant neoplasm of lymph node, unspecified: Secondary | ICD-10-CM | POA: Diagnosis not present

## 2019-04-20 DIAGNOSIS — D701 Agranulocytosis secondary to cancer chemotherapy: Secondary | ICD-10-CM | POA: Diagnosis not present

## 2019-04-20 DIAGNOSIS — Z5111 Encounter for antineoplastic chemotherapy: Secondary | ICD-10-CM | POA: Diagnosis not present

## 2019-04-20 DIAGNOSIS — C189 Malignant neoplasm of colon, unspecified: Secondary | ICD-10-CM

## 2019-04-20 DIAGNOSIS — C182 Malignant neoplasm of ascending colon: Secondary | ICD-10-CM | POA: Diagnosis not present

## 2019-04-20 MED ORDER — PEGFILGRASTIM-CBQV 6 MG/0.6ML ~~LOC~~ SOSY
PREFILLED_SYRINGE | SUBCUTANEOUS | Status: AC
  Filled 2019-04-20: qty 0.6

## 2019-04-20 MED ORDER — PEGFILGRASTIM-CBQV 6 MG/0.6ML ~~LOC~~ SOSY
6.0000 mg | PREFILLED_SYRINGE | Freq: Once | SUBCUTANEOUS | Status: AC
  Administered 2019-04-20: 6 mg via SUBCUTANEOUS

## 2019-04-20 MED ORDER — SODIUM CHLORIDE 0.9% FLUSH
10.0000 mL | INTRAVENOUS | Status: DC | PRN
  Administered 2019-04-20: 13:00:00 10 mL
  Filled 2019-04-20: qty 10

## 2019-04-20 MED ORDER — HEPARIN SOD (PORK) LOCK FLUSH 100 UNIT/ML IV SOLN
500.0000 [IU] | Freq: Once | INTRAVENOUS | Status: AC | PRN
  Administered 2019-04-20: 13:00:00 500 [IU]
  Filled 2019-04-20: qty 5

## 2019-04-20 NOTE — Telephone Encounter (Signed)
Scheduled per los. Called and left msg. Mailed printout  °

## 2019-04-21 MED FILL — FENOFIBRATE 145 MG TABLET: 145 | 90 days supply | Qty: 90 | Fill #2

## 2019-04-26 NOTE — Progress Notes (Signed)
Pharmacist Chemotherapy Monitoring - Follow Up Assessment    I verify that I have reviewed each item in the below checklist:  . Regimen for the patient is scheduled for the appropriate day and plan matches scheduled date. [name] Appropriate non-routine labs are ordered dependent on drug ordered. . If applicable, additional medications reviewed and ordered per protocol based on lifetime cumulative doses and/or treatment regimen.   Plan for follow-up and/or issues identified: No . I-vent associated with next due treatment: No  Alexander Reeves D 04/26/2019 10:13 AM

## 2019-05-01 ENCOUNTER — Other Ambulatory Visit: Payer: Self-pay | Admitting: Oncology

## 2019-05-02 ENCOUNTER — Other Ambulatory Visit: Payer: Self-pay

## 2019-05-02 ENCOUNTER — Inpatient Hospital Stay (HOSPITAL_BASED_OUTPATIENT_CLINIC_OR_DEPARTMENT_OTHER): Payer: Medicare Other | Admitting: Nurse Practitioner

## 2019-05-02 ENCOUNTER — Encounter: Payer: Self-pay | Admitting: Nurse Practitioner

## 2019-05-02 ENCOUNTER — Inpatient Hospital Stay: Payer: Medicare Other

## 2019-05-02 VITALS — BP 156/97 | HR 90 | Temp 98.7°F | Resp 18 | Ht 70.0 in | Wt 149.1 lb

## 2019-05-02 DIAGNOSIS — C779 Secondary and unspecified malignant neoplasm of lymph node, unspecified: Secondary | ICD-10-CM | POA: Diagnosis not present

## 2019-05-02 DIAGNOSIS — C189 Malignant neoplasm of colon, unspecified: Secondary | ICD-10-CM

## 2019-05-02 DIAGNOSIS — D701 Agranulocytosis secondary to cancer chemotherapy: Secondary | ICD-10-CM | POA: Diagnosis not present

## 2019-05-02 DIAGNOSIS — Z5111 Encounter for antineoplastic chemotherapy: Secondary | ICD-10-CM | POA: Diagnosis not present

## 2019-05-02 DIAGNOSIS — C182 Malignant neoplasm of ascending colon: Secondary | ICD-10-CM | POA: Diagnosis not present

## 2019-05-02 LAB — CMP (CANCER CENTER ONLY)
ALT: 29 U/L (ref 0–44)
AST: 26 U/L (ref 15–41)
Albumin: 4 g/dL (ref 3.5–5.0)
Alkaline Phosphatase: 113 U/L (ref 38–126)
Anion gap: 8 (ref 5–15)
BUN: 9 mg/dL (ref 6–20)
CO2: 22 mmol/L (ref 22–32)
Calcium: 9 mg/dL (ref 8.9–10.3)
Chloride: 110 mmol/L (ref 98–111)
Creatinine: 1.31 mg/dL — ABNORMAL HIGH (ref 0.61–1.24)
GFR, Est AFR Am: >60 mL/min (ref >60)
GFR, Estimated: >60 mL/min (ref >60)
Glucose, Bld: 101 mg/dL — ABNORMAL HIGH (ref 70–99)
Potassium: 4.2 mmol/L (ref 3.5–5.1)
Sodium: 140 mmol/L (ref 135–145)
Total Bilirubin: 0.3 mg/dL (ref 0.3–1.2)
Total Protein: 7.2 g/dL (ref 6.5–8.1)

## 2019-05-02 LAB — CBC WITH DIFFERENTIAL (CANCER CENTER ONLY)
Abs Immature Granulocytes: 0.07 10*3/uL (ref 0.00–0.07)
Basophils Absolute: 0 10*3/uL (ref 0.0–0.1)
Basophils Relative: 1 %
Eosinophils Absolute: 0 10*3/uL (ref 0.0–0.5)
Eosinophils Relative: 1 %
HCT: 35.6 % — ABNORMAL LOW (ref 39.0–52.0)
Hemoglobin: 11.1 g/dL — ABNORMAL LOW (ref 13.0–17.0)
Immature Granulocytes: 1 %
Lymphocytes Relative: 24 %
Lymphs Abs: 1.2 10*3/uL (ref 0.7–4.0)
MCH: 26.6 pg (ref 26.0–34.0)
MCHC: 31.2 g/dL (ref 30.0–36.0)
MCV: 85.4 fL (ref 80.0–100.0)
Monocytes Absolute: 0.8 10*3/uL (ref 0.1–1.0)
Monocytes Relative: 15 %
Neutro Abs: 3.1 10*3/uL (ref 1.7–7.7)
Neutrophils Relative %: 58 %
Platelet Count: 127 10*3/uL — ABNORMAL LOW (ref 150–400)
RBC: 4.17 MIL/uL — ABNORMAL LOW (ref 4.22–5.81)
RDW: 22.3 % — ABNORMAL HIGH (ref 11.5–15.5)
WBC Count: 5.2 10*3/uL (ref 4.0–10.5)
nRBC: 0 % (ref 0.0–0.2)

## 2019-05-02 MED ORDER — FLUOROURACIL CHEMO INJECTION 2.5 GM/50ML
400.0000 mg/m2 | Freq: Once | INTRAVENOUS | Status: AC
  Administered 2019-05-02: 15:00:00 750 mg via INTRAVENOUS
  Filled 2019-05-02: qty 15

## 2019-05-02 MED ORDER — OXALIPLATIN CHEMO INJECTION 100 MG/20ML
65.0000 mg/m2 | Freq: Once | INTRAVENOUS | Status: AC
  Administered 2019-05-02: 125 mg via INTRAVENOUS
  Filled 2019-05-02: qty 10

## 2019-05-02 MED ORDER — SODIUM CHLORIDE 0.9 % IV SOLN
10.0000 mg | Freq: Once | INTRAVENOUS | Status: AC
  Administered 2019-05-02: 11:00:00 10 mg via INTRAVENOUS
  Filled 2019-05-02: qty 10

## 2019-05-02 MED ORDER — SODIUM CHLORIDE 0.9 % IV SOLN
2400.0000 mg/m2 | INTRAVENOUS | Status: DC
  Administered 2019-05-02: 4550 mg via INTRAVENOUS
  Filled 2019-05-02: qty 91

## 2019-05-02 MED ORDER — DEXTROSE 5 % IV SOLN
Freq: Once | INTRAVENOUS | Status: AC
  Filled 2019-05-02: qty 250

## 2019-05-02 MED ORDER — PALONOSETRON HCL INJECTION 0.25 MG/5ML
INTRAVENOUS | Status: AC
  Filled 2019-05-02: qty 5

## 2019-05-02 MED ORDER — LEUCOVORIN CALCIUM INJECTION 350 MG
400.0000 mg/m2 | Freq: Once | INTRAVENOUS | Status: AC
  Administered 2019-05-02: 13:00:00 756 mg via INTRAVENOUS
  Filled 2019-05-02: qty 37.8

## 2019-05-02 MED ORDER — PALONOSETRON HCL INJECTION 0.25 MG/5ML
0.2500 mg | Freq: Once | INTRAVENOUS | Status: AC
  Administered 2019-05-02: 11:00:00 0.25 mg via INTRAVENOUS

## 2019-05-02 NOTE — Progress Notes (Addendum)
  [place] OFFICE PROGRESS NOTE   Diagnosis: Colon cancer  INTERVAL HISTORY:   Alexander Reeves returns as scheduled.  He completed cycle 8 FOLFOX 04/18/2019.  He denies nausea/vomiting.  No mouth sores.  Rare diarrhea.  He has stable mild numbness in the left first finger.  Occasional tingling in the feet.  He reports a good appetite but has noted some weight loss.  He thinks he has a recurrent penile cyst and plans to contact urology.  Objective:  Vital signs in last 24 hours:  Blood pressure (!) 156/97, pulse 90, temperature 98.7 F (37.1 C), temperature source Temporal, resp. rate 18, height '5\' 10"'$  (1.778 m), weight 149 lb 1.6 oz (67.6 kg), SpO2 100 %.    HEENT: No thrush or ulcers. GI: Abdomen soft and nontender.  No hepatomegaly. Vascular: No leg edema. Neuro: Vibratory sense with mild decrease over the fingertips per tuning fork exam. Skin: Palms with hyperpigmentation. Port-A-Cath without erythema.   Lab Results:  Lab Results  Component Value Date   WBC 5.2 05/02/2019   HGB 11.1 (L) 05/02/2019   HCT 35.6 (L) 05/02/2019   MCV 85.4 05/02/2019   PLT 127 (L) 05/02/2019   NEUTROABS 3.1 05/02/2019    Imaging:  No results found.  Medications: I have reviewed the patient's current medications.  Assessment/Plan: 1. Moderately differentiated adenocarcinoma ascending colon, stage IIIb (pT3pN1), status post a right colectomy 11/19/2018 ? Lymphovascular and perineural invasion present, 2/14 lymph nodes positive, tumor deposits present ? Positive radial margin, no loss of mismatch repair protein expression, discussed case with Dr. Craig Staggers mass with surrounding inflammation at multiple margins, no gross residual disease, unclear which is the "positive "radial margin, further surgery and radiation not recommended ? Colonoscopy 11/19/2018-completely obstructing mid ascending colon mass, could not be passed with endoscope, biopsy confirmed invasive  adenocarcinoma ? CT abdomen/pelvis 11/18/2018-wall thickening at the mid and distal ascending colon with mild distention of the proximal ascending colon and cecum ? CTs 10/17/2018--no acute findings, no chest lymphadenopathy, lungs clear ? Cycle 1 FOLFOX 12/28/2018 ? Cycle 2 FOLFOX 01/11/2019, Udenyca added ? Cycle 3 FOLFOX 01/25/2019,Udenyca held due to bone pain ? Cycle 4 FOLFOX 02/21/2019, Udenyca added ? Cycle 5 FOLFOX 03/09/2019, Udenyca ? Cycle 6 FOLFOX 03/21/2019, Udenyca ? Cycle 7 FOLFOX 04/04/2019, Udenyca ? Cycle 8 FOLFOX 04/18/2019, Udenyca  ? Cycle 9 FOLFOX 05/02/2019, Udenyca  2. Deaf 3. Right epididymal cyst removal 03/22/2018 4. Asthma 5. Port-A-Cath placement, [name], 12/23/2018 6. Neutropenia secondary to chemotherapy-Udenyca added for cycle 2 FOLFOX 7. 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 appears stable.  He has completed 8 cycles of FOLFOX.  He continues to tolerate chemotherapy well.  Plan to proceed with cycle 9 today as scheduled.  We reviewed the CBC from today.  Counts adequate to proceed with treatment.  He has mild thrombocytopenia.  He understands to contact the office with bleeding.  He will return for lab, follow-up, cycle 10 FOLFOX in 2 weeks.  He will contact the office in the interim as outlined above or with any other problems.    Ned Card ANP/GNP-BC   05/02/2019  10:38 AM

## 2019-05-02 NOTE — Patient Instructions (Signed)
[facility] Discharge Instructions for Patients Receiving Chemotherapy  Today you received the following chemotherapy agents Oxaliplatin, Leucovorin, 5FU  To help prevent nausea and vomiting after your treatment, we encourage you to take your nausea medication as directed   If you develop nausea and vomiting that is not controlled by your nausea medication, call the clinic.   BELOW ARE SYMPTOMS THAT SHOULD BE REPORTED IMMEDIATELY:  *FEVER GREATER THAN 100.5 F  *CHILLS WITH OR WITHOUT FEVER  NAUSEA AND VOMITING THAT IS NOT CONTROLLED WITH YOUR NAUSEA MEDICATION  *UNUSUAL SHORTNESS OF BREATH  *UNUSUAL BRUISING OR BLEEDING  TENDERNESS IN MOUTH AND THROAT WITH OR WITHOUT PRESENCE OF ULCERS  *URINARY PROBLEMS  *BOWEL PROBLEMS  UNUSUAL RASH Items with * indicate a potential emergency and should be followed up as soon as possible.  Feel free to call the clinic should you have any questions or concerns. The clinic phone number is [phone].  Please show the CHEMO ALERT CARD at check-in to the Emergency Department and triage nurse.   

## 2019-05-02 NOTE — Patient Instructions (Signed)

## 2019-05-03 ENCOUNTER — Telehealth: Payer: Self-pay | Admitting: Nurse Practitioner

## 2019-05-03 NOTE — Telephone Encounter (Signed)
Scheduled per los. Called and spoke with patient. Confirmed appt 

## 2019-05-04 ENCOUNTER — Other Ambulatory Visit: Payer: Self-pay

## 2019-05-04 ENCOUNTER — Inpatient Hospital Stay: Payer: Medicare Other

## 2019-05-04 VITALS — BP 121/74 | HR 105 | Temp 98.7°F | Resp 18

## 2019-05-04 DIAGNOSIS — C779 Secondary and unspecified malignant neoplasm of lymph node, unspecified: Secondary | ICD-10-CM | POA: Diagnosis not present

## 2019-05-04 DIAGNOSIS — Z5111 Encounter for antineoplastic chemotherapy: Secondary | ICD-10-CM | POA: Diagnosis not present

## 2019-05-04 DIAGNOSIS — C182 Malignant neoplasm of ascending colon: Secondary | ICD-10-CM | POA: Diagnosis not present

## 2019-05-04 DIAGNOSIS — D701 Agranulocytosis secondary to cancer chemotherapy: Secondary | ICD-10-CM | POA: Diagnosis not present

## 2019-05-04 DIAGNOSIS — C189 Malignant neoplasm of colon, unspecified: Secondary | ICD-10-CM

## 2019-05-04 MED ORDER — SODIUM CHLORIDE 0.9% FLUSH
10.0000 mL | INTRAVENOUS | Status: DC | PRN
  Administered 2019-05-04: 13:00:00 10 mL
  Filled 2019-05-04: qty 10

## 2019-05-04 MED ORDER — PEGFILGRASTIM-CBQV 6 MG/0.6ML ~~LOC~~ SOSY
6.0000 mg | PREFILLED_SYRINGE | Freq: Once | SUBCUTANEOUS | Status: AC
  Administered 2019-05-04: 6 mg via SUBCUTANEOUS

## 2019-05-04 MED ORDER — PEGFILGRASTIM-CBQV 6 MG/0.6ML ~~LOC~~ SOSY
PREFILLED_SYRINGE | SUBCUTANEOUS | Status: AC
  Filled 2019-05-04: qty 0.6

## 2019-05-04 MED ORDER — HEPARIN SOD (PORK) LOCK FLUSH 100 UNIT/ML IV SOLN
500.0000 [IU] | Freq: Once | INTRAVENOUS | Status: AC | PRN
  Administered 2019-05-04: 500 [IU]
  Filled 2019-05-04: qty 5

## 2019-05-06 MED FILL — VENTOLIN HFA 90 MCG INHALER: 108 (90 BAS | 16 days supply | Qty: 18 | Fill #3

## 2019-05-10 NOTE — Progress Notes (Signed)
Pharmacist Chemotherapy Monitoring - Follow Up Assessment    I verify that I have reviewed each item in the below checklist:  . Regimen for the patient is scheduled for the appropriate day and plan matches scheduled date. [name] Appropriate non-routine labs are ordered dependent on drug ordered. . If applicable, additional medications reviewed and ordered per protocol based on lifetime cumulative doses and/or treatment regimen.   Plan for follow-up and/or issues identified: No . I-vent associated with next due treatment: No . MD and/or nursing notified: No   Kennith Center, Pharm.D., CPP 05/10/2019@3 :29 PM

## 2019-05-12 ENCOUNTER — Telehealth: Payer: Self-pay | Admitting: Emergency Medicine

## 2019-05-12 ENCOUNTER — Ambulatory Visit: Payer: Medicare Other

## 2019-05-12 ENCOUNTER — Encounter: Payer: Medicare Other | Admitting: Medical

## 2019-05-12 DIAGNOSIS — R300 Dysuria: Secondary | ICD-10-CM

## 2019-05-12 NOTE — Telephone Encounter (Signed)
Called pt regarding [place] appt for today, spoke with pt (VIA INTERPRETER).  Pt states he was able to get into contact with transport services and that he will be able to come to [facility] at 1pm tomorrow (unable to get transport today).  High priority scheduling message sent to change [place] appts to 1/1:30 pm tomorrow, PA Lucianne Lei aware.  Pt aware to f/u as needed before then.

## 2019-05-12 NOTE — Telephone Encounter (Signed)
Received triage call from pt ([employer] Interpreter used by pt) reporting reddish-orange urine for the last two days with intermittent dysuria and frequency.  Denies fever/chills, CP/SOB, N/V/D, or any recent injury.  Denies back or abdominal pain.  Pt is concerned about urinary issues.  Pt states he will contact transport services to determine if he can get a ride to [facility] today, states he will call [facility] line back ti alert RN of outcome.  Appt time range between 1-3 pm today with labs & [facility] visit, pt aware.  No call back received yet, however lab orders and high priority scheduling messages put in place anyway.  PA Lucianne Lei to alert MD Benay Spice.

## 2019-05-12 NOTE — Progress Notes (Signed)
[facility] Progress Note   Alexander Reeves [id number] 09-Dec-1965 54 y.o.  Alexander Pelt Sr. is managed by Dr. Dominica Severin B. Sherrill  Actively treated with chemotherapy/immunotherapy/hormonal therapy: yes  Current therapy: FOLFOX  Last treated: 05/02/2019 (cycle #9)  Next scheduled appointment with provider: 05/16/2019  Assessment: Plan:    Malignant neoplasm of ascending colon ([place])   Moderately differentiated adenocarcinoma ascending colon, stage IIIb (pT3pN1): Alexander Reeves is status post cycle #9 of FOLFOX which was dosed on 05/02/2019. He is scheduled to be see next on 05/16/2019.   Suspected hematuria: A urinalysis and urine culture were collected. The patient was given a prescription for Cipro.  Please see After Visit Summary for patient specific instructions.  Future Appointments  Date Time Provider [place]  05/13/2019  1:00 PM [facility] LAB 5 [facility] None  05/13/2019  1:30 PM Breyon Sigg, Lucianne Lei E., PA-C [facility] None  05/16/2019  9:45 AM [facility]/FLUSH [facility] None  05/16/2019 10:00 AM [facility] [place] None  05/16/2019 10:30 AM Ladell Pier, MD [facility] None  05/16/2019 11:15 AM [facility] INFUSION [facility] None  05/16/2019  1:45 PM Karie Mainland, RD [facility] None  05/18/2019 11:15 AM [facility] [place] FLUSH [facility] None  05/30/2019 10:15 AM [facility]/FLUSH [facility] None  05/30/2019 10:30 AM [facility] [place] FLUSH [facility] None  05/30/2019 11:00 AM Ladell Pier, MD [facility] None  05/30/2019 12:00 PM [facility] INFUSION [facility] None  06/01/2019 10:30 AM [facility] [place] FLUSH [facility] None    No orders of the defined types were placed in this encounter.      Subjective:   Patient ID:  Alexander AUSLEY Sr. is a 54 y.o. (DOB Oct 08, 1965) male.  Chief Complaint: No chief complaint on file.   HPI Alexander MESSMAN Sr.  is a 54 y.o. male with a diagnosis of a stage IIIb (pT3pN1)moderately differentiated  adenocarcinoma ascending colon. He is managed by Dr. Benay Spice and is status post cycle #9 of FOLFOX which was dosed on 05/02/2019.  We received triage call from pt (ASL Interpreter used by pt) yesterday with Alexander Reeves reporting reddish-orange urine for the last two days with intermittent dysuria and frequency. He denied fever/chills, chest pain, shortness of breath, nausea, vomiting, constipation, diarrhea or any recent injury.  He also denied back or abdominal pain.  He has been using Azo with improvement in his symptoms.  Medications: I have reviewed the patient's current medications.  Allergies:  Allergies  Allergen Reactions  . Cheese Shortness Of Breath and Swelling    Swelling of the throat  . Eggs Or Egg-Derived Products Shortness Of Breath and Swelling    Swelling of the throat  . Penicillins Anaphylaxis    Has patient had a PCN reaction causing immediate rash, facial/tongue/throat swelling, SOB or lightheadedness with hypotension: Yes Has patient had a PCN reaction causing severe rash involving mucus membranes or skin necrosis: Unknown Has patient had a PCN reaction that required hospitalization: Unknown Has patient had a PCN reaction occurring within the last 10 years: Unknown If all of the above answers are "NO", then may proceed with Cephalosporin use.     Past Medical History:  Diagnosis Date  . Arthritis    KNEES  . Asthma   . Deaf   . GERD (gastroesophageal reflux disease)    OCC  . Scrotal cyst   . Thyroid disease   . Urinary frequency    OCC    Past Surgical History:  Procedure Laterality Date  .  BIOPSY  11/19/2018   Procedure: BIOPSY;  Surgeon: Thornton Park, MD;  Location: [place];  Service: Gastroenterology;;  . BLADDER SURGERY  YRS AGO  . COLONOSCOPY WITH PROPOFOL N/A 11/19/2018   Procedure: COLONOSCOPY WITH PROPOFOL;  Surgeon: Thornton Park, MD;  Location: [place];  Service: Gastroenterology;  Laterality: N/A;  . EPIDIDYMECTOMY Right  03/22/2018   Procedure: SCROTAL EXPLORATION RIGHT  PARTIAL EPIDIDYMECTOMY;  Surgeon: [name], MD;  Location: [facility];  Service: Urology;  Laterality: Right;  . HEMORRHOID SURGERY  WHEN YOUNG  . LAPAROTOMY N/A 11/19/2018   Procedure: EXPLORATORY LAPAROTOMY;  Surgeon: Rolm Bookbinder, MD;  Location: [place];  Service: General;  Laterality: N/A;  . NO PAST SURGERIES    . PARTIAL COLECTOMY Right 11/19/2018   Procedure: PARTIAL COLECTOMY;  Surgeon: Rolm Bookbinder, MD;  Location: [place];  Service: General;  Laterality: Right;  . PORTACATH PLACEMENT Right 12/23/2018   Procedure: INSERTION PORT-A-CATH WITH ULTRASOUND GUIDANCE;  Surgeon: Rolm Bookbinder, MD;  Location: [place];  Service: General;  Laterality: Right;  . SUBMUCOSAL TATTOO INJECTION  11/19/2018   Procedure: SUBMUCOSAL TATTOO INJECTION;  Surgeon: Thornton Park, MD;  Location: [facility] ENDOSCOPY;  Service: Gastroenterology;;  . [name]    Family History  Problem Relation Age of Onset  . Heart disease Father     Social History   Socioeconomic History  . Marital status: Single    Spouse name: Not on file  . Number of children: Not on file  . Years of education: Not on file  . Highest education level: Not on file  Occupational History  . Not on file  Tobacco Use  . Smoking status: Former Smoker    Types: Cigarettes    Quit date: 06/24/2016    Years since quitting: 2.8  . Smokeless tobacco: Never Used  Substance and Sexual Activity  . Alcohol use: Yes    Comment: OCC  . Drug use: Yes    Types: Marijuana    Comment: weekends (last fri and sat)  . Sexual activity: Never    Birth control/protection: None  Other Topics Concern  . Not on file  Social History Narrative   [facility] (09/01/16):   No new pets. Moved from [place]. Questionable mold exposure at his previous residence and possibly some at his new apartment as well.    Social Determinants of Health    Financial Resource Strain:   . Difficulty of Paying Living Expenses:   Food Insecurity:   . Worried About Charity fundraiser in the Last Year:   . Arboriculturist in the Last Year:   Transportation Needs:   . Film/video editor (Medical):   [name] Lack of Transportation (Non-Medical):   Physical Activity:   . Days of Exercise per Week:   . Minutes of Exercise per Session:   Stress:   . Feeling of Stress :   Social Connections:   . Frequency of Communication with Friends and Family:   . Frequency of Social Gatherings with Friends and Family:   . Attends Religious Services:   . Active Member of Clubs or Organizations:   . Attends Archivist Meetings:   [name] Marital Status:   Intimate Partner Violence:   . Fear of Current or Ex-Partner:   . Emotionally Abused:   [name] Physically Abused:   . Sexually Abused:     Past Medical History, Surgical history, Social history, and Family history were reviewed  and updated as appropriate.   Please see review of systems for further details on the patient's review from today.   Review of Systems:  Review of Systems  Constitutional: Negative for chills, diaphoresis and fever.  Respiratory: Negative for cough and shortness of breath.   Cardiovascular: Negative for chest pain, palpitations and leg swelling.  Gastrointestinal: Negative for abdominal pain, constipation, diarrhea, nausea and vomiting.  Genitourinary: Positive for dysuria, frequency and hematuria. Negative for difficulty urinating, flank pain and urgency.  Skin: Negative for rash.  Neurological: Negative for dizziness and headaches.    Objective:   Physical Exam:  There were no vitals taken for this visit. ECOG: 0  Physical Exam Constitutional:      General: He is not in acute distress.    Appearance: He is not diaphoretic.  HENT:     Head: Normocephalic and atraumatic.  Eyes:     General: No scleral icterus.       Right eye: No discharge.        Left eye: No  discharge.  Cardiovascular:     Rate and Rhythm: Normal rate and regular rhythm.     Heart sounds: Normal heart sounds. No murmur. No friction rub. No gallop.   Pulmonary:     Effort: Pulmonary effort is normal. No respiratory distress.     Breath sounds: Normal breath sounds. No wheezing or rales.  Abdominal:     General: Bowel sounds are normal. There is no distension.     Palpations: Abdomen is soft. There is no mass.     Tenderness: There is no abdominal tenderness. There is no guarding or rebound.  Skin:    General: Skin is warm and dry.     Findings: No erythema or rash.  Neurological:     Mental Status: He is alert.     Coordination: Coordination normal.     Gait: Gait normal.     Lab Review:     Component Value Date/Time   NA 140 05/02/2019 0930   NA 141 10/11/2018 1115   K 4.2 05/02/2019 0930   CL 110 05/02/2019 0930   CO2 22 05/02/2019 0930   GLUCOSE 101 (H) 05/02/2019 0930   BUN 9 05/02/2019 0930   BUN 9 10/11/2018 1115   CREATININE 1.31 (H) 05/02/2019 0930   CALCIUM 9.0 05/02/2019 0930   PROT 7.2 05/02/2019 0930   PROT 7.1 10/11/2018 1115   ALBUMIN 4.0 05/02/2019 0930   ALBUMIN 4.4 10/11/2018 1115   AST 26 05/02/2019 0930   ALT 29 05/02/2019 0930   ALKPHOS 113 05/02/2019 0930   BILITOT 0.3 05/02/2019 0930   GFRNONAA >60 05/02/2019 0930   GFRAA >60 05/02/2019 0930       Component Value Date/Time   WBC 5.2 05/02/2019 0930   WBC 1.5 (L) 02/11/2019 0503   RBC 4.17 (L) 05/02/2019 0930   HGB 11.1 (L) 05/02/2019 0930   HGB 13.4 10/11/2018 1115   HCT 35.6 (L) 05/02/2019 0930   HCT 41.4 10/11/2018 1115   PLT 127 (L) 05/02/2019 0930   PLT 170 10/11/2018 1115   MCV 85.4 05/02/2019 0930   MCV 81 10/11/2018 1115   MCH 26.6 05/02/2019 0930   MCHC 31.2 05/02/2019 0930   RDW 22.3 (H) 05/02/2019 0930   RDW 14.0 10/11/2018 1115   LYMPHSABS 1.2 05/02/2019 0930   LYMPHSABS 1.6 10/11/2018 1115   MONOABS 0.8 05/02/2019 0930   EOSABS 0.0 05/02/2019 0930    EOSABS 0.2 10/11/2018 1115  BASOSABS 0.0 05/02/2019 0930   BASOSABS 0.0 10/11/2018 1115   -------------------------------  Imaging from last 24 hours (if applicable):  Radiology interpretation: No results found.

## 2019-05-13 ENCOUNTER — Inpatient Hospital Stay: Payer: Medicare Other

## 2019-05-13 ENCOUNTER — Other Ambulatory Visit: Payer: Self-pay

## 2019-05-13 ENCOUNTER — Inpatient Hospital Stay (HOSPITAL_BASED_OUTPATIENT_CLINIC_OR_DEPARTMENT_OTHER): Payer: Medicare Other | Admitting: Medical

## 2019-05-13 VITALS — BP 143/92 | HR 89 | Temp 98.9°F | Resp 18 | Ht 70.0 in | Wt 142.4 lb

## 2019-05-13 DIAGNOSIS — D701 Agranulocytosis secondary to cancer chemotherapy: Secondary | ICD-10-CM | POA: Diagnosis not present

## 2019-05-13 DIAGNOSIS — C182 Malignant neoplasm of ascending colon: Secondary | ICD-10-CM

## 2019-05-13 DIAGNOSIS — Z5111 Encounter for antineoplastic chemotherapy: Secondary | ICD-10-CM | POA: Diagnosis not present

## 2019-05-13 DIAGNOSIS — C779 Secondary and unspecified malignant neoplasm of lymph node, unspecified: Secondary | ICD-10-CM | POA: Diagnosis not present

## 2019-05-13 DIAGNOSIS — R300 Dysuria: Secondary | ICD-10-CM

## 2019-05-13 LAB — CBC WITH DIFFERENTIAL (CANCER CENTER ONLY)
Abs Immature Granulocytes: 0.01 10*3/uL (ref 0.00–0.07)
Basophils Absolute: 0 10*3/uL (ref 0.0–0.1)
Basophils Relative: 1 %
Eosinophils Absolute: 0 10*3/uL (ref 0.0–0.5)
Eosinophils Relative: 2 %
HCT: 33.8 % — ABNORMAL LOW (ref 39.0–52.0)
Hemoglobin: 10.4 g/dL — ABNORMAL LOW (ref 13.0–17.0)
Immature Granulocytes: 1 %
Lymphocytes Relative: 54 %
Lymphs Abs: 1.2 10*3/uL (ref 0.7–4.0)
MCH: 26.6 pg (ref 26.0–34.0)
MCHC: 30.8 g/dL (ref 30.0–36.0)
MCV: 86.4 fL (ref 80.0–100.0)
Monocytes Absolute: 0.6 10*3/uL (ref 0.1–1.0)
Monocytes Relative: 27 %
Neutro Abs: 0.3 10*3/uL — CL (ref 1.7–7.7)
Neutrophils Relative %: 15 %
Platelet Count: 96 10*3/uL — ABNORMAL LOW (ref 150–400)
RBC: 3.91 MIL/uL — ABNORMAL LOW (ref 4.22–5.81)
RDW: 20.6 % — ABNORMAL HIGH (ref 11.5–15.5)
WBC Count: 2.2 10*3/uL — ABNORMAL LOW (ref 4.0–10.5)
nRBC: 0 % (ref 0.0–0.2)

## 2019-05-13 LAB — CMP (CANCER CENTER ONLY)
ALT: 22 U/L (ref 0–44)
AST: 26 U/L (ref 15–41)
Albumin: 4.2 g/dL (ref 3.5–5.0)
Alkaline Phosphatase: 103 U/L (ref 38–126)
Anion gap: 9 (ref 5–15)
BUN: 7 mg/dL (ref 6–20)
CO2: 26 mmol/L (ref 22–32)
Calcium: 9.2 mg/dL (ref 8.9–10.3)
Chloride: 105 mmol/L (ref 98–111)
Creatinine: 1.27 mg/dL — ABNORMAL HIGH (ref 0.61–1.24)
GFR, Est AFR Am: >60 mL/min (ref >60)
GFR, Estimated: >60 mL/min (ref >60)
Glucose, Bld: 88 mg/dL (ref 70–99)
Potassium: 3.7 mmol/L (ref 3.5–5.1)
Sodium: 140 mmol/L (ref 135–145)
Total Bilirubin: 0.4 mg/dL (ref 0.3–1.2)
Total Protein: 7.3 g/dL (ref 6.5–8.1)

## 2019-05-13 MED ORDER — CIPROFLOXACIN HCL 500 MG PO TABS: 500.0000 mg | ORAL_TABLET | Freq: Two times a day (BID) | ORAL | 0 refills | Status: DC

## 2019-05-13 MED ORDER — SULFAMETHOXAZOLE-TRIMETHOPRIM 800-160 MG PO TABS
1.0000 | ORAL_TABLET | Freq: Two times a day (BID) | ORAL | 0 refills | Status: DC
Start: 2019-05-13 — End: 2019-05-16

## 2019-05-13 MED FILL — CIPROFLOXACIN HCL 500 MG TA: 500 | 7 days supply | Qty: 14 | Fill #0

## 2019-05-13 MED FILL — SULFAMETHOXAZOLE-TMP DS TAB: 800-160 | 7 days supply | Qty: 14 | Fill #0

## 2019-05-15 ENCOUNTER — Other Ambulatory Visit: Payer: Self-pay | Admitting: Oncology

## 2019-05-16 ENCOUNTER — Inpatient Hospital Stay: Payer: Medicare Other

## 2019-05-16 ENCOUNTER — Other Ambulatory Visit: Payer: Self-pay

## 2019-05-16 ENCOUNTER — Inpatient Hospital Stay: Payer: Medicare Other | Admitting: Nutrition

## 2019-05-16 ENCOUNTER — Inpatient Hospital Stay: Payer: Medicare Other | Attending: Nurse Practitioner | Admitting: Oncology

## 2019-05-16 VITALS — BP 135/85 | HR 87 | Temp 98.9°F | Resp 17 | Ht 70.0 in | Wt 145.0 lb

## 2019-05-16 DIAGNOSIS — C189 Malignant neoplasm of colon, unspecified: Secondary | ICD-10-CM

## 2019-05-16 DIAGNOSIS — Z95828 Presence of other vascular implants and grafts: Secondary | ICD-10-CM

## 2019-05-16 DIAGNOSIS — G620 Drug-induced polyneuropathy: Secondary | ICD-10-CM | POA: Insufficient documentation

## 2019-05-16 DIAGNOSIS — C182 Malignant neoplasm of ascending colon: Secondary | ICD-10-CM | POA: Insufficient documentation

## 2019-05-16 DIAGNOSIS — T451X5A Adverse effect of antineoplastic and immunosuppressive drugs, initial encounter: Secondary | ICD-10-CM | POA: Insufficient documentation

## 2019-05-16 DIAGNOSIS — C779 Secondary and unspecified malignant neoplasm of lymph node, unspecified: Secondary | ICD-10-CM | POA: Insufficient documentation

## 2019-05-16 DIAGNOSIS — Z5111 Encounter for antineoplastic chemotherapy: Secondary | ICD-10-CM | POA: Diagnosis not present

## 2019-05-16 DIAGNOSIS — D701 Agranulocytosis secondary to cancer chemotherapy: Secondary | ICD-10-CM | POA: Diagnosis not present

## 2019-05-16 DIAGNOSIS — R309 Painful micturition, unspecified: Secondary | ICD-10-CM | POA: Insufficient documentation

## 2019-05-16 LAB — CBC WITH DIFFERENTIAL (CANCER CENTER ONLY)
Abs Immature Granulocytes: 0.04 10*3/uL (ref 0.00–0.07)
Basophils Absolute: 0 10*3/uL (ref 0.0–0.1)
Basophils Relative: 1 %
Eosinophils Absolute: 0.1 10*3/uL (ref 0.0–0.5)
Eosinophils Relative: 2 %
HCT: 32.8 % — ABNORMAL LOW (ref 39.0–52.0)
Hemoglobin: 10.1 g/dL — ABNORMAL LOW (ref 13.0–17.0)
Immature Granulocytes: 1 %
Lymphocytes Relative: 25 %
Lymphs Abs: 1 10*3/uL (ref 0.7–4.0)
MCH: 26.8 pg (ref 26.0–34.0)
MCHC: 30.8 g/dL (ref 30.0–36.0)
MCV: 87 fL (ref 80.0–100.0)
Monocytes Absolute: 0.6 10*3/uL (ref 0.1–1.0)
Monocytes Relative: 14 %
Neutro Abs: 2.4 10*3/uL (ref 1.7–7.7)
Neutrophils Relative %: 57 %
Platelet Count: 121 10*3/uL — ABNORMAL LOW (ref 150–400)
RBC: 3.77 MIL/uL — ABNORMAL LOW (ref 4.22–5.81)
RDW: 21.4 % — ABNORMAL HIGH (ref 11.5–15.5)
WBC Count: 4.2 10*3/uL (ref 4.0–10.5)
nRBC: 0 % (ref 0.0–0.2)

## 2019-05-16 LAB — CMP (CANCER CENTER ONLY)
ALT: 22 U/L (ref 0–44)
AST: 25 U/L (ref 15–41)
Albumin: 4 g/dL (ref 3.5–5.0)
Alkaline Phosphatase: 88 U/L (ref 38–126)
Anion gap: 8 (ref 5–15)
BUN: 8 mg/dL (ref 6–20)
CO2: 26 mmol/L (ref 22–32)
Calcium: 9 mg/dL (ref 8.9–10.3)
Chloride: 108 mmol/L (ref 98–111)
Creatinine: 1.32 mg/dL — ABNORMAL HIGH (ref 0.61–1.24)
GFR, Est AFR Am: >60 mL/min (ref >60)
GFR, Estimated: >60 mL/min (ref >60)
Glucose, Bld: 92 mg/dL (ref 70–99)
Potassium: 3.9 mmol/L (ref 3.5–5.1)
Sodium: 142 mmol/L (ref 135–145)
Total Bilirubin: 0.3 mg/dL (ref 0.3–1.2)
Total Protein: 7 g/dL (ref 6.5–8.1)

## 2019-05-16 LAB — URINALYSIS, COMPLETE (UACMP) WITH MICROSCOPIC
Bilirubin Urine: NEGATIVE
Glucose, UA: 50 mg/dL — AB
Hgb urine dipstick: NEGATIVE
Ketones, ur: 5 mg/dL — AB
Leukocytes,Ua: NEGATIVE
Nitrite: NEGATIVE
Protein, ur: 30 mg/dL — AB
Specific Gravity, Urine: 1.032 — ABNORMAL HIGH (ref 1.005–1.030)
pH: 5 (ref 5.0–8.0)

## 2019-05-16 MED ORDER — SODIUM CHLORIDE 0.9 % IV SOLN
2400.0000 mg/m2 | INTRAVENOUS | Status: DC
  Administered 2019-05-16: 4550 mg via INTRAVENOUS
  Filled 2019-05-16: qty 91

## 2019-05-16 MED ORDER — SODIUM CHLORIDE 0.9% FLUSH
10.0000 mL | INTRAVENOUS | Status: DC | PRN
  Administered 2019-05-16: 10 mL
  Filled 2019-05-16: qty 10

## 2019-05-16 NOTE — Patient Instructions (Signed)

## 2019-05-16 NOTE — Patient Instructions (Signed)
[facility] Discharge Instructions for Patients Receiving Chemotherapy  Today you received the following chemotherapy agents: fluorouracil.  To help prevent nausea and vomiting after your treatment, we encourage you to take your nausea medication as directed.  If you develop nausea and vomiting that is not controlled by your nausea medication, call the clinic.   BELOW ARE SYMPTOMS THAT SHOULD BE REPORTED IMMEDIATELY:  *FEVER GREATER THAN 100.5 F  *CHILLS WITH OR WITHOUT FEVER  NAUSEA AND VOMITING THAT IS NOT CONTROLLED WITH YOUR NAUSEA MEDICATION  *UNUSUAL SHORTNESS OF BREATH  *UNUSUAL BRUISING OR BLEEDING  TENDERNESS IN MOUTH AND THROAT WITH OR WITHOUT PRESENCE OF ULCERS  *URINARY PROBLEMS  *BOWEL PROBLEMS  UNUSUAL RASH Items with * indicate a potential emergency and should be followed up as soon as possible.  Feel free to call the clinic should you have any questions or concerns. The clinic phone number is [phone].  Please show the CHEMO ALERT CARD at check-in to the Emergency Department and triage nurse.   

## 2019-05-16 NOTE — Progress Notes (Signed)
Alexander Reeves OFFICE PROGRESS NOTE   Diagnosis: Colon cancer  INTERVAL HISTORY:   Alexander Reeves returns as scheduled.  He completed another cycle of FOLFOX on 05/02/2019.  No nausea/vomiting, mouth sores, or diarrhea.  He has dryness and hyperpigmentation of the hands.  He has numbness in the left second finger.  No other numbness.  He has persistent cold sensitivity. Alexander Reeves was seen in the symptom management clinic on 05/13/2019 with hematuria.  He reports mild burning with urination.  Hematuria has improved.  Objective:  Vital signs in last 24 hours:  Blood pressure 135/85, pulse 87, temperature 98.9 F (37.2 C), temperature source Temporal, resp. rate 17, height '5\' 10"'  (1.778 m), weight 145 lb (65.8 kg), SpO2 100 %.    HEENT: No thrush or ulcers, hyperpigmentation of the buccal mucosa Resp: Decreased breath sounds at the posterior bases, no respiratory distress Cardio: Regular rate and rhythm GI: No hepatosplenomegaly, nontender, no mass Vascular: No leg edema Neuro: Mild loss of vibratory sense at the fingertips bilaterally Skin: Hyperpigmentation of the hands  Portacath/PICC-without erythema  Lab Results:  Lab Results  Component Value Date   WBC 4.2 05/16/2019   HGB 10.1 (L) 05/16/2019   HCT 32.8 (L) 05/16/2019   MCV 87.0 05/16/2019   PLT 121 (L) 05/16/2019   NEUTROABS 2.4 05/16/2019    CMP  Lab Results  Component Value Date   NA 140 05/13/2019   K 3.7 05/13/2019   CL 105 05/13/2019   CO2 26 05/13/2019   GLUCOSE 88 05/13/2019   BUN 7 05/13/2019   CREATININE 1.27 (H) 05/13/2019   CALCIUM 9.2 05/13/2019   PROT 7.3 05/13/2019   ALBUMIN 4.2 05/13/2019   AST 26 05/13/2019   ALT 22 05/13/2019   ALKPHOS 103 05/13/2019   BILITOT 0.4 05/13/2019   GFRNONAA >60 05/13/2019   GFRAA >60 05/13/2019    Lab Results  Component Value Date   CEA1 2.7 11/18/2018    Medications: I have reviewed the patient's current  medications.   Assessment/Plan: 1. Moderately differentiated adenocarcinoma ascending colon, stage IIIb (pT3pN1), status post a right colectomy 11/19/2018 ? Lymphovascular and perineural invasion present, 2/14 lymph nodes positive, tumor deposits present ? Positive radial margin, no loss of mismatch repair protein expression, discussed case with Dr. Craig Staggers mass with surrounding inflammation at multiple margins, no gross residual disease, unclear which is the "positive "radial margin, further surgery and radiation not recommended ? Colonoscopy 11/19/2018-completely obstructing mid ascending colon mass, could not be passed with endoscope, biopsy confirmed invasive adenocarcinoma ? CT abdomen/pelvis 11/18/2018-wall thickening at the mid and distal ascending colon with mild distention of the proximal ascending colon and cecum ? CTs 10/17/2018--no acute findings, no chest lymphadenopathy, lungs clear ? Cycle 1 FOLFOX 12/28/2018 ? Cycle 2 FOLFOX 01/11/2019, Udenyca added ? Cycle 3 FOLFOX 01/25/2019,Udenyca held due to bone pain ? Cycle 4 FOLFOX 02/21/2019, Udenyca added ? Cycle 5 FOLFOX 03/09/2019, Udenyca ? Cycle 6 FOLFOX 03/21/2019, Udenyca ? Cycle 7 FOLFOX 04/04/2019, Udenyca ? Cycle 8 FOLFOX 04/18/2019, Udenyca  ? Cycle 9 FOLFOX 05/02/2019, Udenyca ? Cycle 10 FOLFOX 05/16/2019, oxaliplatin, 5-FU bolus, and Udenyca held  2. Deaf 3. Right epididymal cyst removal 03/22/2018 4. Asthma 5. Port-A-Cath placement, [name], 12/23/2018 6. Neutropenia secondary to chemotherapy-Udenyca added for cycle 2 FOLFOX 7. 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  He has completed 9 cycles of FOLFOX.  He  is developing oxaliplatin neuropathy.  Oxaliplatin will be held with this cycle.  We will also hold the 5-FU bolus and Udenyca.  He reports dysuria.  We will check a urinalysis  today.  He is completing a course of antibiotics prescribed on 05/13/2019.  Alexander Reeves will return for an office visit and chemotherapy in 2 weeks.  Betsy Coder, MD  05/16/2019  10:49 AM

## 2019-05-16 NOTE — Progress Notes (Signed)
Nutrition follow-up completed with patient using the assistance of a sign language interpreter. Patient is receiving treatment for colon cancer. Patient reports he continues to have a good appetite and is eating 3 meals a day. He reports he sometimes drinks a protein shake.  Today he consumed Premier protein providing 160 cal and 30 g of protein. Current weight was documented as 145 pounds May 3 improved slightly from 142.4 pounds on April 30. Patient is not concerned with weight loss at this time.  Nutrition diagnosis: Unintended weight loss improved.  Intervention: Encourage patient to add 1-2 snacks daily and include high-calorie, high-protein foods. Suggested patient could try a higher calorie nutrition shake such as Ensure plus or boost plus. Provided coupons. Questions answered.  Teach back method used.  Monitoring, evaluation, goals: Patient will tolerate increased calories and protein to minimize further weight loss.  Next visit: To be scheduled as needed.  **Disclaimer: This note was dictated with voice recognition software. Similar sounding words can inadvertently be transcribed and this note may contain transcription errors which may not have been corrected upon publication of note.**

## 2019-05-17 ENCOUNTER — Telehealth: Payer: Self-pay | Admitting: Oncology

## 2019-05-17 NOTE — Telephone Encounter (Signed)
Scheduled per los. Called and left msg. Mailed printout  °

## 2019-05-18 ENCOUNTER — Inpatient Hospital Stay: Payer: Medicare Other

## 2019-05-18 ENCOUNTER — Other Ambulatory Visit: Payer: Self-pay

## 2019-05-18 VITALS — BP 108/73 | HR 90 | Temp 98.9°F | Resp 18

## 2019-05-18 DIAGNOSIS — Z5111 Encounter for antineoplastic chemotherapy: Secondary | ICD-10-CM | POA: Diagnosis not present

## 2019-05-18 DIAGNOSIS — C779 Secondary and unspecified malignant neoplasm of lymph node, unspecified: Secondary | ICD-10-CM | POA: Diagnosis not present

## 2019-05-18 DIAGNOSIS — G620 Drug-induced polyneuropathy: Secondary | ICD-10-CM | POA: Diagnosis not present

## 2019-05-18 DIAGNOSIS — D701 Agranulocytosis secondary to cancer chemotherapy: Secondary | ICD-10-CM | POA: Diagnosis not present

## 2019-05-18 DIAGNOSIS — C182 Malignant neoplasm of ascending colon: Secondary | ICD-10-CM | POA: Diagnosis not present

## 2019-05-18 DIAGNOSIS — R309 Painful micturition, unspecified: Secondary | ICD-10-CM | POA: Diagnosis not present

## 2019-05-18 DIAGNOSIS — C189 Malignant neoplasm of colon, unspecified: Secondary | ICD-10-CM

## 2019-05-18 MED ORDER — SODIUM CHLORIDE 0.9% FLUSH
10.0000 mL | INTRAVENOUS | Status: DC | PRN
  Administered 2019-05-18: 10 mL
  Filled 2019-05-18: qty 10

## 2019-05-18 MED ORDER — HEPARIN SOD (PORK) LOCK FLUSH 100 UNIT/ML IV SOLN
500.0000 [IU] | Freq: Once | INTRAVENOUS | Status: AC | PRN
  Administered 2019-05-18: 500 [IU]
  Filled 2019-05-18: qty 5

## 2019-05-18 NOTE — Patient Instructions (Signed)

## 2019-05-24 NOTE — Progress Notes (Signed)
Pharmacist Chemotherapy Monitoring - Follow Up Assessment    I verify that I have reviewed each item in the below checklist:  . Regimen for the patient is scheduled for the appropriate day and plan matches scheduled date. [name] Appropriate non-routine labs are ordered dependent on drug ordered. . If applicable, additional medications reviewed and ordered per protocol based on lifetime cumulative doses and/or treatment regimen.   Plan for follow-up and/or issues identified: Yes . I-vent associated with next due treatment: Yes . MD and/or nursing notified: No   Kennith Center, Pharm.D., CPP 05/24/2019@4 :04 PM

## 2019-05-29 ENCOUNTER — Other Ambulatory Visit: Payer: Self-pay | Admitting: Oncology

## 2019-05-30 ENCOUNTER — Inpatient Hospital Stay (HOSPITAL_BASED_OUTPATIENT_CLINIC_OR_DEPARTMENT_OTHER): Payer: Medicare Other | Admitting: Oncology

## 2019-05-30 ENCOUNTER — Other Ambulatory Visit: Payer: Self-pay

## 2019-05-30 ENCOUNTER — Inpatient Hospital Stay: Payer: Medicare Other

## 2019-05-30 VITALS — BP 138/84 | HR 90 | Temp 98.2°F | Resp 18 | Ht 70.0 in | Wt 147.4 lb

## 2019-05-30 DIAGNOSIS — G620 Drug-induced polyneuropathy: Secondary | ICD-10-CM | POA: Diagnosis not present

## 2019-05-30 DIAGNOSIS — R309 Painful micturition, unspecified: Secondary | ICD-10-CM | POA: Diagnosis not present

## 2019-05-30 DIAGNOSIS — R300 Dysuria: Secondary | ICD-10-CM

## 2019-05-30 DIAGNOSIS — Z5111 Encounter for antineoplastic chemotherapy: Secondary | ICD-10-CM | POA: Diagnosis not present

## 2019-05-30 DIAGNOSIS — C189 Malignant neoplasm of colon, unspecified: Secondary | ICD-10-CM

## 2019-05-30 DIAGNOSIS — C182 Malignant neoplasm of ascending colon: Secondary | ICD-10-CM | POA: Diagnosis not present

## 2019-05-30 DIAGNOSIS — C779 Secondary and unspecified malignant neoplasm of lymph node, unspecified: Secondary | ICD-10-CM | POA: Diagnosis not present

## 2019-05-30 DIAGNOSIS — D701 Agranulocytosis secondary to cancer chemotherapy: Secondary | ICD-10-CM | POA: Diagnosis not present

## 2019-05-30 LAB — CBC WITH DIFFERENTIAL (CANCER CENTER ONLY)
Abs Immature Granulocytes: 0.01 10*3/uL (ref 0.00–0.07)
Basophils Absolute: 0 10*3/uL (ref 0.0–0.1)
Basophils Relative: 1 %
Eosinophils Absolute: 0.1 10*3/uL (ref 0.0–0.5)
Eosinophils Relative: 3 %
HCT: 33.9 % — ABNORMAL LOW (ref 39.0–52.0)
Hemoglobin: 10.3 g/dL — ABNORMAL LOW (ref 13.0–17.0)
Immature Granulocytes: 0 %
Lymphocytes Relative: 31 %
Lymphs Abs: 1 10*3/uL (ref 0.7–4.0)
MCH: 27.2 pg (ref 26.0–34.0)
MCHC: 30.4 g/dL (ref 30.0–36.0)
MCV: 89.7 fL (ref 80.0–100.0)
Monocytes Absolute: 0.5 10*3/uL (ref 0.1–1.0)
Monocytes Relative: 16 %
Neutro Abs: 1.5 10*3/uL — ABNORMAL LOW (ref 1.7–7.7)
Neutrophils Relative %: 49 %
Platelet Count: 180 10*3/uL (ref 150–400)
RBC: 3.78 MIL/uL — ABNORMAL LOW (ref 4.22–5.81)
RDW: 23.1 % — ABNORMAL HIGH (ref 11.5–15.5)
WBC Count: 3.1 10*3/uL — ABNORMAL LOW (ref 4.0–10.5)
nRBC: 0 % (ref 0.0–0.2)

## 2019-05-30 LAB — URINALYSIS, COMPLETE (UACMP) WITH MICROSCOPIC
Bilirubin Urine: NEGATIVE
Glucose, UA: 50 mg/dL — AB
Hgb urine dipstick: NEGATIVE
Ketones, ur: NEGATIVE mg/dL
Leukocytes,Ua: NEGATIVE
Nitrite: NEGATIVE
Protein, ur: NEGATIVE mg/dL
Specific Gravity, Urine: 1.02 (ref 1.005–1.030)
pH: 7 (ref 5.0–8.0)

## 2019-05-30 LAB — CMP (CANCER CENTER ONLY)
ALT: 14 U/L (ref 0–44)
AST: 18 U/L (ref 15–41)
Albumin: 4 g/dL (ref 3.5–5.0)
Alkaline Phosphatase: 60 U/L (ref 38–126)
Anion gap: 9 (ref 5–15)
BUN: 10 mg/dL (ref 6–20)
CO2: 23 mmol/L (ref 22–32)
Calcium: 9.2 mg/dL (ref 8.9–10.3)
Chloride: 111 mmol/L (ref 98–111)
Creatinine: 1.24 mg/dL (ref 0.61–1.24)
GFR, Est AFR Am: >60 mL/min (ref >60)
GFR, Estimated: >60 mL/min (ref >60)
Glucose, Bld: 90 mg/dL (ref 70–99)
Potassium: 4.4 mmol/L (ref 3.5–5.1)
Sodium: 143 mmol/L (ref 135–145)
Total Bilirubin: 0.3 mg/dL (ref 0.3–1.2)
Total Protein: 7 g/dL (ref 6.5–8.1)

## 2019-05-30 MED ORDER — SODIUM CHLORIDE 0.9% FLUSH
10.0000 mL | INTRAVENOUS | Status: DC | PRN
  Administered 2019-05-30: 10 mL
  Filled 2019-05-30: qty 10

## 2019-05-30 MED ORDER — SODIUM CHLORIDE 0.9 % IV SOLN
2400.0000 mg/m2 | INTRAVENOUS | Status: DC
  Administered 2019-05-30: 4550 mg via INTRAVENOUS
  Filled 2019-05-30: qty 91

## 2019-05-30 MED ORDER — DEXTROSE 5 % IV SOLN
Freq: Once | INTRAVENOUS | Status: DC
  Filled 2019-05-30: qty 250

## 2019-05-30 MED FILL — VENTOLIN HFA 90 MCG INHALER: 108 (90 BAS | 25 days supply | Qty: 18 | Fill #2

## 2019-05-30 NOTE — Progress Notes (Signed)
Per Dr. Benay Spice: OK to treat w/ANC 1.5. Will only have 5FU pump today.

## 2019-05-30 NOTE — Progress Notes (Signed)
[place] OFFICE PROGRESS NOTE   Diagnosis: Colon cancer  INTERVAL HISTORY:   Alexander Reeves completed a cycle of 5-fluorouracil on 05/16/2019.  No mouth sores or diarrhea.  He continues to have numbness of the fingers, but this has improved.  He has mild burning with urination in the urine is sometimes yellow and sometimes "white ".  No hematuria.  He has noted a knot on his "penis ".  Objective:  Vital signs in last 24 hours:  Blood pressure 138/84, pulse 90, temperature 98.2 F (36.8 C), temperature source Temporal, resp. rate 18, height '5\' 10"'  (1.778 m), weight 147 lb 6.4 oz (66.9 kg), SpO2 100 %.    HEENT: No thrush or ulcers Resp: Lungs clear bilaterally Cardio: Regular rate and rhythm GI: No hepatomegaly, nontender Vascular: No leg edema Neuro: Mild loss of vibratory sense at the fingertips bilaterally Skin: Dryness and hyperpigmentation of the hands GU: 2 mm mobile cutaneous nodule with a central head at the right shaft of the penis  Portacath/PICC-without erythema  Lab Results:  Lab Results  Component Value Date   WBC 3.1 (L) 05/30/2019   HGB 10.3 (L) 05/30/2019   HCT 33.9 (L) 05/30/2019   MCV 89.7 05/30/2019   PLT 180 05/30/2019   NEUTROABS 1.5 (L) 05/30/2019    CMP  Lab Results  Component Value Date   NA 142 05/16/2019   K 3.9 05/16/2019   CL 108 05/16/2019   CO2 26 05/16/2019   GLUCOSE 92 05/16/2019   BUN 8 05/16/2019   CREATININE 1.32 (H) 05/16/2019   CALCIUM 9.0 05/16/2019   PROT 7.0 05/16/2019   ALBUMIN 4.0 05/16/2019   AST 25 05/16/2019   ALT 22 05/16/2019   ALKPHOS 88 05/16/2019   BILITOT 0.3 05/16/2019   GFRNONAA >60 05/16/2019   GFRAA >60 05/16/2019    Lab Results  Component Value Date   CEA1 2.7 11/18/2018    Medications: I have reviewed the patient's current medications.   Assessment/Plan: 1. Moderately differentiated adenocarcinoma ascending colon, stage IIIb (pT3pN1), status post a right colectomy  11/19/2018 ? Lymphovascular and perineural invasion present, 2/14 lymph nodes positive, tumor deposits present ? Positive radial margin, no loss of mismatch repair protein expression, discussed case with Dr. Craig Staggers mass with surrounding inflammation at multiple margins, no gross residual disease, unclear which is the "positive "radial margin, further surgery and radiation not recommended ? Colonoscopy 11/19/2018-completely obstructing mid ascending colon mass, could not be passed with endoscope, biopsy confirmed invasive adenocarcinoma ? CT abdomen/pelvis 11/18/2018-wall thickening at the mid and distal ascending colon with mild distention of the proximal ascending colon and cecum ? CTs 10/17/2018--no acute findings, no chest lymphadenopathy, lungs clear ? Cycle 1 FOLFOX 12/28/2018 ? Cycle 2 FOLFOX 01/11/2019, Udenyca added ? Cycle 3 FOLFOX 01/25/2019,Udenyca held due to bone pain ? Cycle 4 FOLFOX 02/21/2019, Udenyca added ? Cycle 5 FOLFOX 03/09/2019, Udenyca ? Cycle 6 FOLFOX 03/21/2019, Udenyca ? Cycle 7 FOLFOX 04/04/2019, Udenyca ? Cycle 8 FOLFOX 04/18/2019, Udenyca  ? Cycle 9 FOLFOX 05/02/2019, Udenyca ? Cycle 10 FOLFOX 05/16/2019, oxaliplatin, 5-FU bolus, and Udenyca held ? Cycle 11 FOLFOX 05/30/2019, oxaliplatin, 5-FU bolus, and Udenyca held  2. Deaf 3. Right epididymal cyst removal 03/22/2018 4. Asthma 5. Port-A-Cath placement, [name], 12/23/2018 6. Neutropenia secondary to chemotherapy-Udenyca added for cycle 2 FOLFOX 7. 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 appears stable.  He continues to  have symptoms of oxaliplatin neuropathy.  Oxaliplatin will remain on hold. He complains of dysuria.  He had pyuria on urinalysis earlier this month.  His symptoms improved after course of ciprofloxacin.  We will obtain a repeat urinalysis and culture today.  The lesion  at the penis shaft is likely a benign cyst.  We will make a urology referral if this lesion enlarges.  [name] will return for an office visit and chemotherapy in 2 weeks.  I encouraged him to obtain the COVID-19 vaccine.  Betsy Coder, MD  05/30/2019  11:04 AM

## 2019-05-30 NOTE — Patient Instructions (Signed)
[facility] Discharge Instructions for Patients Receiving Chemotherapy  Today you received the following chemotherapy agents: 5FU  To help prevent nausea and vomiting after your treatment, we encourage you to take your nausea medication as directed.   If you develop nausea and vomiting that is not controlled by your nausea medication, call the clinic.   BELOW ARE SYMPTOMS THAT SHOULD BE REPORTED IMMEDIATELY:  *FEVER GREATER THAN 100.5 F  *CHILLS WITH OR WITHOUT FEVER  NAUSEA AND VOMITING THAT IS NOT CONTROLLED WITH YOUR NAUSEA MEDICATION  *UNUSUAL SHORTNESS OF BREATH  *UNUSUAL BRUISING OR BLEEDING  TENDERNESS IN MOUTH AND THROAT WITH OR WITHOUT PRESENCE OF ULCERS  *URINARY PROBLEMS  *BOWEL PROBLEMS  UNUSUAL RASH Items with * indicate a potential emergency and should be followed up as soon as possible.  Feel free to call the clinic should you have any questions or concerns. The clinic phone number is [phone].  Please show the CHEMO ALERT CARD at check-in to the Emergency Department and triage nurse.   

## 2019-05-31 LAB — URINE CULTURE: Culture: NO GROWTH

## 2019-06-01 ENCOUNTER — Inpatient Hospital Stay: Payer: Medicare Other

## 2019-06-01 ENCOUNTER — Other Ambulatory Visit: Payer: Self-pay

## 2019-06-01 VITALS — BP 128/72 | HR 78 | Temp 98.2°F | Resp 18

## 2019-06-01 DIAGNOSIS — C182 Malignant neoplasm of ascending colon: Secondary | ICD-10-CM | POA: Diagnosis not present

## 2019-06-01 DIAGNOSIS — C779 Secondary and unspecified malignant neoplasm of lymph node, unspecified: Secondary | ICD-10-CM | POA: Diagnosis not present

## 2019-06-01 DIAGNOSIS — C189 Malignant neoplasm of colon, unspecified: Secondary | ICD-10-CM

## 2019-06-01 DIAGNOSIS — Z5111 Encounter for antineoplastic chemotherapy: Secondary | ICD-10-CM | POA: Diagnosis not present

## 2019-06-01 DIAGNOSIS — R309 Painful micturition, unspecified: Secondary | ICD-10-CM | POA: Diagnosis not present

## 2019-06-01 DIAGNOSIS — D701 Agranulocytosis secondary to cancer chemotherapy: Secondary | ICD-10-CM | POA: Diagnosis not present

## 2019-06-01 DIAGNOSIS — G620 Drug-induced polyneuropathy: Secondary | ICD-10-CM | POA: Diagnosis not present

## 2019-06-01 MED ORDER — HEPARIN SOD (PORK) LOCK FLUSH 100 UNIT/ML IV SOLN
500.0000 [IU] | Freq: Once | INTRAVENOUS | Status: AC | PRN
  Administered 2019-06-01: 500 [IU]
  Filled 2019-06-01: qty 5

## 2019-06-01 MED ORDER — SODIUM CHLORIDE 0.9% FLUSH
10.0000 mL | INTRAVENOUS | Status: DC | PRN
  Administered 2019-06-01: 10 mL
  Filled 2019-06-01: qty 10

## 2019-06-06 ENCOUNTER — Telehealth: Payer: Self-pay | Admitting: *Deleted

## 2019-06-06 NOTE — Telephone Encounter (Signed)
-----   Message from Ladell Pier, MD sent at 06/03/2019  8:47 AM EDT ----- Has pyuria, culture negative, needs to follow-up with his primary physician or urology ----- Message ----- From: Buel Ream, Lab In [place] Sent: 05/30/2019   2:11 PM EDT To: Ladell Pier, MD

## 2019-06-06 NOTE — Telephone Encounter (Signed)
Notified of WBC's in urine, but culture is negative for bacteria. Need to follow up with PCP. Sent results to Jodene Nam, NP, who he sees for primary care.

## 2019-06-08 NOTE — Progress Notes (Signed)
Pharmacist Chemotherapy Monitoring - Follow Up Assessment    I verify that I have reviewed each item in the below checklist:  . Regimen for the patient is scheduled for the appropriate day and plan matches scheduled date. [name] Appropriate non-routine labs are ordered dependent on drug ordered. . If applicable, additional medications reviewed and ordered per protocol based on lifetime cumulative doses and/or treatment regimen.   Plan for follow-up and/or issues identified: No . I-vent associated with next due treatment: No . MD and/or nursing notified: No  Ohm Dentler D 06/08/2019 9:06 AM

## 2019-06-12 ENCOUNTER — Other Ambulatory Visit: Payer: Self-pay | Admitting: Oncology

## 2019-06-14 MED FILL — Dexamethasone Sodium Phosphate Inj 100 MG/10ML: INTRAMUSCULAR | Qty: 1 | Status: AC

## 2019-06-15 ENCOUNTER — Other Ambulatory Visit: Payer: Self-pay

## 2019-06-15 ENCOUNTER — Inpatient Hospital Stay: Payer: Medicare Other | Attending: Nurse Practitioner | Admitting: Nurse Practitioner

## 2019-06-15 ENCOUNTER — Other Ambulatory Visit: Payer: Self-pay | Admitting: Nurse Practitioner

## 2019-06-15 ENCOUNTER — Encounter: Payer: Self-pay | Admitting: Nurse Practitioner

## 2019-06-15 ENCOUNTER — Inpatient Hospital Stay: Payer: Medicare Other

## 2019-06-15 ENCOUNTER — Inpatient Hospital Stay: Payer: Medicare Other | Admitting: Nutrition

## 2019-06-15 VITALS — BP 123/84 | HR 90 | Temp 98.1°F | Resp 18 | Ht 70.0 in | Wt 146.6 lb

## 2019-06-15 DIAGNOSIS — C189 Malignant neoplasm of colon, unspecified: Secondary | ICD-10-CM

## 2019-06-15 DIAGNOSIS — Z95828 Presence of other vascular implants and grafts: Secondary | ICD-10-CM

## 2019-06-15 DIAGNOSIS — R300 Dysuria: Secondary | ICD-10-CM | POA: Diagnosis not present

## 2019-06-15 DIAGNOSIS — Z760 Encounter for issue of repeat prescription: Secondary | ICD-10-CM

## 2019-06-15 DIAGNOSIS — Z5111 Encounter for antineoplastic chemotherapy: Secondary | ICD-10-CM | POA: Insufficient documentation

## 2019-06-15 DIAGNOSIS — D701 Agranulocytosis secondary to cancer chemotherapy: Secondary | ICD-10-CM | POA: Diagnosis not present

## 2019-06-15 DIAGNOSIS — C779 Secondary and unspecified malignant neoplasm of lymph node, unspecified: Secondary | ICD-10-CM | POA: Insufficient documentation

## 2019-06-15 DIAGNOSIS — C182 Malignant neoplasm of ascending colon: Secondary | ICD-10-CM

## 2019-06-15 LAB — CMP (CANCER CENTER ONLY)
ALT: 10 U/L (ref 0–44)
AST: 17 U/L (ref 15–41)
Albumin: 4.2 g/dL (ref 3.5–5.0)
Alkaline Phosphatase: 65 U/L (ref 38–126)
Anion gap: 10 (ref 5–15)
BUN: 11 mg/dL (ref 6–20)
CO2: 22 mmol/L (ref 22–32)
Calcium: 9.3 mg/dL (ref 8.9–10.3)
Chloride: 108 mmol/L (ref 98–111)
Creatinine: 1.29 mg/dL — ABNORMAL HIGH (ref 0.61–1.24)
GFR, Est AFR Am: >60 mL/min (ref >60)
GFR, Estimated: >60 mL/min (ref >60)
Glucose, Bld: 101 mg/dL — ABNORMAL HIGH (ref 70–99)
Potassium: 4.3 mmol/L (ref 3.5–5.1)
Sodium: 140 mmol/L (ref 135–145)
Total Bilirubin: 0.3 mg/dL (ref 0.3–1.2)
Total Protein: 7.2 g/dL (ref 6.5–8.1)

## 2019-06-15 LAB — CBC WITH DIFFERENTIAL (CANCER CENTER ONLY)
Abs Immature Granulocytes: 0.01 10*3/uL (ref 0.00–0.07)
Basophils Absolute: 0 10*3/uL (ref 0.0–0.1)
Basophils Relative: 1 %
Eosinophils Absolute: 0.3 10*3/uL (ref 0.0–0.5)
Eosinophils Relative: 6 %
HCT: 37.4 % — ABNORMAL LOW (ref 39.0–52.0)
Hemoglobin: 11.7 g/dL — ABNORMAL LOW (ref 13.0–17.0)
Immature Granulocytes: 0 %
Lymphocytes Relative: 26 %
Lymphs Abs: 1.1 10*3/uL (ref 0.7–4.0)
MCH: 28.8 pg (ref 26.0–34.0)
MCHC: 31.3 g/dL (ref 30.0–36.0)
MCV: 92.1 fL (ref 80.0–100.0)
Monocytes Absolute: 0.4 10*3/uL (ref 0.1–1.0)
Monocytes Relative: 11 %
Neutro Abs: 2.4 10*3/uL (ref 1.7–7.7)
Neutrophils Relative %: 56 %
Platelet Count: 177 10*3/uL (ref 150–400)
RBC: 4.06 MIL/uL — ABNORMAL LOW (ref 4.22–5.81)
RDW: 20.3 % — ABNORMAL HIGH (ref 11.5–15.5)
WBC Count: 4.2 10*3/uL (ref 4.0–10.5)
nRBC: 0 % (ref 0.0–0.2)

## 2019-06-15 LAB — CEA (IN HOUSE-CHCC): CEA (CHCC-In House): 1.03 ng/mL (ref 0.00–5.00)

## 2019-06-15 MED ORDER — DEXTROSE 5 % IV SOLN
Freq: Once | INTRAVENOUS | Status: AC
  Filled 2019-06-15: qty 250

## 2019-06-15 MED ORDER — LEUCOVORIN CALCIUM INJECTION 350 MG
400.0000 mg/m2 | Freq: Once | INTRAVENOUS | Status: AC
  Administered 2019-06-15: 756 mg via INTRAVENOUS
  Filled 2019-06-15: qty 37.8

## 2019-06-15 MED ORDER — ALBUTEROL SULFATE HFA 108 (90 BASE) MCG/ACT IN AERS: 2.0000 | INHALATION_SPRAY | RESPIRATORY_TRACT | 3 refills | Status: DC | PRN

## 2019-06-15 MED ORDER — SODIUM CHLORIDE 0.9% FLUSH
10.0000 mL | INTRAVENOUS | Status: DC | PRN
  Administered 2019-06-15: 10 mL
  Filled 2019-06-15: qty 10

## 2019-06-15 MED ORDER — SODIUM CHLORIDE 0.9 % IV SOLN
2400.0000 mg/m2 | INTRAVENOUS | Status: DC
  Administered 2019-06-15: 4550 mg via INTRAVENOUS
  Filled 2019-06-15: qty 91

## 2019-06-15 MED ORDER — PROCHLORPERAZINE MALEATE 10 MG PO TABS: 10.0000 mg | ORAL_TABLET | Freq: Four times a day (QID) | ORAL | 1 refills | Status: DC | PRN

## 2019-06-15 MED FILL — PROCHLORPERAZINE 10 MG TAB: 10 | 15 days supply | Qty: 60 | Fill #0

## 2019-06-15 NOTE — Progress Notes (Signed)
[facility] OFFICE PROGRESS NOTE   Diagnosis: Colon cancer  INTERVAL HISTORY:   Alexander Reeves returns as scheduled.  He completed a cycle of 5-fluorouracil 05/30/2019.  He has completed 11 cycles of systemic therapy.  He denies nausea/vomiting.  No mouth sores.  No diarrhea.  He notes hyperpigmentation and dryness over the palms.  He continues to have neuropathy symptoms in the hands and feet.  Objective:  Vital signs in last 24 hours:  Blood pressure 123/84, pulse 90, temperature 98.1 F (36.7 C), temperature source Temporal, resp. rate 18, height '5\' 10"'  (1.778 m), weight 146 lb 9.6 oz (66.5 kg), SpO2 99 %.    HEENT: Mild white coating of her tongue. Resp: Lungs clear bilaterally. Cardio: Regular rate and rhythm. GI: Abdomen soft and nontender.  No hepatomegaly. Vascular: No leg edema.  Skin: Palms dry appearing, hyperpigmentation of the skin. Port-A-Cath without erythema.   Lab Results:  Lab Results  Component Value Date   WBC 4.2 06/15/2019   HGB 11.7 (L) 06/15/2019   HCT 37.4 (L) 06/15/2019   MCV 92.1 06/15/2019   PLT 177 06/15/2019   NEUTROABS 2.4 06/15/2019    Imaging:  No results found.  Medications: I have reviewed the patient's current medications.  Assessment/Plan: 1. Moderately differentiated adenocarcinoma ascending colon, stage IIIb (pT3pN1), status post a right colectomy 11/19/2018 ? Lymphovascular and perineural invasion present, 2/14 lymph nodes positive, tumor deposits present ? Positive radial margin, no loss of mismatch repair protein expression, discussed case with Dr. Craig Reeves mass with surrounding inflammation at multiple margins, no gross residual disease, unclear which is the "positive "radial margin, further surgery and radiation not recommended ? Colonoscopy 11/19/2018-completely obstructing mid ascending colon mass, could not be passed with endoscope, biopsy confirmed invasive adenocarcinoma ? CT abdomen/pelvis  11/18/2018-wall thickening at the mid and distal ascending colon with mild distention of the proximal ascending colon and cecum ? CTs 10/17/2018--no acute findings, no chest lymphadenopathy, lungs clear ? Cycle 1 FOLFOX 12/28/2018 ? Cycle 2 FOLFOX 01/11/2019, Udenyca added ? Cycle 3 FOLFOX 01/25/2019,Udenyca held due to bone pain ? Cycle 4 FOLFOX 02/21/2019, Udenyca added ? Cycle 5 FOLFOX 03/09/2019, Udenyca ? Cycle 6 FOLFOX 03/21/2019, Udenyca ? Cycle 7 FOLFOX 04/04/2019, Udenyca ? Cycle 8 FOLFOX 04/18/2019, Udenyca  ? Cycle 9 FOLFOX 05/02/2019, Udenyca ? Cycle 10 FOLFOX 05/16/2019, oxaliplatin, 5-FU bolus, and Udenyca held ? Cycle 11 FOLFOX 05/30/2019, oxaliplatin, 5-FU bolus, and Udenyca held ? Cycle 12 FOLFOX 06/15/2019, oxaliplatin, 5-FU bolus and Udenyca held  2. Deaf 3. Right epididymal cyst removal 03/22/2018 4. Asthma 5. Port-A-Cath placement, Alexander Reeves, 12/23/2018 6. Neutropenia secondary to chemotherapy-Udenyca added for cycle 2 FOLFOX 7. 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  He has completed 11 cycles of adjuvant systemic therapy.  Oxaliplatin was placed on hold beginning with cycle 10 due to neuropathy symptoms.  He has persistent neuropathy symptoms.  Oxaliplatin will be held with cycle 12 today, also hold 5-FU bolus and Udenyca.  Plan to proceed with the 12th and final cycle of systemic therapy today as scheduled.  We reviewed the CBC from today.  Counts adequate to proceed as above.  Initial colonoscopy November 2020 showed a completely obstructing mass that could not be passed.  We will refer him for a follow-up colonoscopy when he returns in 3 weeks.  We will also discuss timing of Port-A-Cath removal and surveillance CT scans.  He will  return for lab and follow-up in 3 weeks.  He will contact the office in the interim with any problems.    Ned Card ANP/GNP-BC   06/15/2019  11:23 AM

## 2019-06-15 NOTE — Patient Instructions (Signed)
[facility] Discharge Instructions for Patients Receiving Chemotherapy  Today you received the following chemotherapy agents: leucovorin and fluorouracil.  To help prevent nausea and vomiting after your treatment, we encourage you to take your nausea medication as directed.   If you develop nausea and vomiting that is not controlled by your nausea medication, call the clinic.   BELOW ARE SYMPTOMS THAT SHOULD BE REPORTED IMMEDIATELY:  *FEVER GREATER THAN 100.5 F  *CHILLS WITH OR WITHOUT FEVER  NAUSEA AND VOMITING THAT IS NOT CONTROLLED WITH YOUR NAUSEA MEDICATION  *UNUSUAL SHORTNESS OF BREATH  *UNUSUAL BRUISING OR BLEEDING  TENDERNESS IN MOUTH AND THROAT WITH OR WITHOUT PRESENCE OF ULCERS  *URINARY PROBLEMS  *BOWEL PROBLEMS  UNUSUAL RASH Items with * indicate a potential emergency and should be followed up as soon as possible.  Feel free to call the clinic should you have any questions or concerns. The clinic phone number is [phone].  Please show the CHEMO ALERT CARD at check-in to the Emergency Department and triage nurse.   

## 2019-06-15 NOTE — Progress Notes (Signed)
Brief nutrition follow-up completed with patient using the assistance of a sign language interpreter. Patient is receiving treatment for colon cancer. Patient continues to have a good appetite and is eating well. He denies nausea, vomiting, diarrhea, and mouth sores. Noted labs: Glucose 101 and creatinine 1.29. Weight documented as 146.6 pounds on June 2 which has stabilized over the past month. Patient verbalizes no questions and denies nutrition needs.  Nutrition diagnosis: Unintended weight loss improved.  Intervention: Recommended patient continue high-calorie high-protein foods and small frequent meals and snacks. Encourage patient to drink Ensure Plus or boost plus and provided additional coupons. Recommended patient contact me with further questions or concerns.  Monitoring, evaluation, goals: Patient will tolerate increased calories and protein to minimize weight loss throughout treatment.  Next visit: To be scheduled as needed.  **Disclaimer: This note was dictated with voice recognition software. Similar sounding words can inadvertently be transcribed and this note may contain transcription errors which may not have been corrected upon publication of note.**

## 2019-06-17 ENCOUNTER — Inpatient Hospital Stay: Payer: Medicare Other

## 2019-06-17 ENCOUNTER — Other Ambulatory Visit: Payer: Self-pay

## 2019-06-17 DIAGNOSIS — Z5111 Encounter for antineoplastic chemotherapy: Secondary | ICD-10-CM | POA: Diagnosis not present

## 2019-06-17 DIAGNOSIS — C189 Malignant neoplasm of colon, unspecified: Secondary | ICD-10-CM

## 2019-06-17 MED ORDER — SODIUM CHLORIDE 0.9% FLUSH
10.0000 mL | INTRAVENOUS | Status: DC | PRN
  Administered 2019-06-17: 10 mL
  Filled 2019-06-17: qty 10

## 2019-06-17 MED ORDER — HEPARIN SOD (PORK) LOCK FLUSH 100 UNIT/ML IV SOLN
500.0000 [IU] | Freq: Once | INTRAVENOUS | Status: AC | PRN
  Administered 2019-06-17: 500 [IU]
  Filled 2019-06-17: qty 5

## 2019-07-06 ENCOUNTER — Inpatient Hospital Stay: Payer: Medicare Other

## 2019-07-06 ENCOUNTER — Other Ambulatory Visit: Payer: Self-pay

## 2019-07-06 ENCOUNTER — Other Ambulatory Visit: Payer: Self-pay | Admitting: Nurse Practitioner

## 2019-07-06 ENCOUNTER — Inpatient Hospital Stay (HOSPITAL_BASED_OUTPATIENT_CLINIC_OR_DEPARTMENT_OTHER): Payer: Medicare Other | Admitting: Oncology

## 2019-07-06 DIAGNOSIS — Z95828 Presence of other vascular implants and grafts: Secondary | ICD-10-CM

## 2019-07-06 DIAGNOSIS — C189 Malignant neoplasm of colon, unspecified: Secondary | ICD-10-CM

## 2019-07-06 DIAGNOSIS — Z760 Encounter for issue of repeat prescription: Secondary | ICD-10-CM | POA: Diagnosis not present

## 2019-07-06 DIAGNOSIS — Z5111 Encounter for antineoplastic chemotherapy: Secondary | ICD-10-CM | POA: Diagnosis not present

## 2019-07-06 LAB — CBC WITH DIFFERENTIAL (CANCER CENTER ONLY)
Abs Immature Granulocytes: 0.01 10*3/uL (ref 0.00–0.07)
Basophils Absolute: 0 10*3/uL (ref 0.0–0.1)
Basophils Relative: 1 %
Eosinophils Absolute: 0.1 10*3/uL (ref 0.0–0.5)
Eosinophils Relative: 3 %
HCT: 37.5 % — ABNORMAL LOW (ref 39.0–52.0)
Hemoglobin: 11.8 g/dL — ABNORMAL LOW (ref 13.0–17.0)
Immature Granulocytes: 0 %
Lymphocytes Relative: 36 %
Lymphs Abs: 1.2 10*3/uL (ref 0.7–4.0)
MCH: 29.1 pg (ref 26.0–34.0)
MCHC: 31.5 g/dL (ref 30.0–36.0)
MCV: 92.6 fL (ref 80.0–100.0)
Monocytes Absolute: 0.4 10*3/uL (ref 0.1–1.0)
Monocytes Relative: 12 %
Neutro Abs: 1.6 10*3/uL — ABNORMAL LOW (ref 1.7–7.7)
Neutrophils Relative %: 48 %
Platelet Count: 166 10*3/uL (ref 150–400)
RBC: 4.05 MIL/uL — ABNORMAL LOW (ref 4.22–5.81)
RDW: 16.9 % — ABNORMAL HIGH (ref 11.5–15.5)
WBC Count: 3.2 10*3/uL — ABNORMAL LOW (ref 4.0–10.5)
nRBC: 0 % (ref 0.0–0.2)

## 2019-07-06 MED ORDER — HEPARIN SOD (PORK) LOCK FLUSH 100 UNIT/ML IV SOLN
500.0000 [IU] | Freq: Once | INTRAVENOUS | Status: AC | PRN
  Administered 2019-07-06: 500 [IU]
  Filled 2019-07-06: qty 5

## 2019-07-06 MED ORDER — SODIUM CHLORIDE 0.9% FLUSH
10.0000 mL | INTRAVENOUS | Status: DC | PRN
  Administered 2019-07-06: 10 mL
  Filled 2019-07-06: qty 10

## 2019-07-06 MED ORDER — ALBUTEROL SULFATE HFA 108 (90 BASE) MCG/ACT IN AERS: 2.0000 | INHALATION_SPRAY | RESPIRATORY_TRACT | 3 refills | Status: DC | PRN

## 2019-07-06 NOTE — Progress Notes (Signed)
[facility] OFFICE PROGRESS NOTE   Diagnosis: Colon cancer  INTERVAL HISTORY:   Alexander Reeves returns as scheduled.  He completed a final cycle of 5-fluorouracil on 06/15/2019.  He feels well.  He continues to have neuropathy symptoms in the extremities.  This is partially improved.  He has noted a "lesion" on his penis.  Dysuria has improved with Azo.  Objective:  Vital signs in last 24 hours:  Blood pressure 127/75, pulse 90, temperature 98.1 F (36.7 C), temperature source Temporal, resp. rate 20, height 5' 10" (1.778 m), weight 148 lb 3.2 oz (67.2 kg), SpO2 100 %.    Lymphatics: No cervical, supraclavicular, axillary, or inguinal nodes Resp: Lungs clear bilaterally Cardio: Regular rate and rhythm GI: No hepatosplenomegaly, no mass, nontender Vascular: No leg edema GU: 1 cm round soft lesion with a central head on the right shaft of the penis, minimal clear discharge Skin: Hyperpigmentation of the hands  Portacath/PICC-without erythema  Lab Results:  Lab Results  Component Value Date   WBC 3.2 (L) 07/06/2019   HGB 11.8 (L) 07/06/2019   HCT 37.5 (L) 07/06/2019   MCV 92.6 07/06/2019   PLT 166 07/06/2019   NEUTROABS 1.6 (L) 07/06/2019    CMP  Lab Results  Component Value Date   NA 140 06/15/2019   K 4.3 06/15/2019   CL 108 06/15/2019   CO2 22 06/15/2019   GLUCOSE 101 (H) 06/15/2019   BUN 11 06/15/2019   CREATININE 1.29 (H) 06/15/2019   CALCIUM 9.3 06/15/2019   PROT 7.2 06/15/2019   ALBUMIN 4.2 06/15/2019   AST 17 06/15/2019   ALT 10 06/15/2019   ALKPHOS 65 06/15/2019   BILITOT 0.3 06/15/2019   GFRNONAA >60 06/15/2019   GFRAA >60 06/15/2019    Lab Results  Component Value Date   CEA1 1.03 06/15/2019    Medications: I have reviewed the patient's current medications.   Assessment/Plan: 1. Moderately differentiated adenocarcinoma ascending colon, stage IIIb (pT3pN1), status post a right colectomy 11/19/2018 ? Lymphovascular and perineural  invasion present, 2/14 lymph nodes positive, tumor deposits present ? Positive radial margin, no loss of mismatch repair protein expression, discussed case with Dr. Wakefield-colonic mass with surrounding inflammation at multiple margins, no gross residual disease, unclear which is the "positive "radial margin, further surgery and radiation not recommended ? Colonoscopy 11/19/2018-completely obstructing mid ascending colon mass, could not be passed with endoscope, biopsy confirmed invasive adenocarcinoma ? CT abdomen/pelvis 11/18/2018-wall thickening at the mid and distal ascending colon with mild distention of the proximal ascending colon and cecum ? CTs 10/17/2018--no acute findings, no chest lymphadenopathy, lungs clear ? Cycle 1 FOLFOX 12/28/2018 ? Cycle 2 FOLFOX 01/11/2019, Udenyca added ? Cycle 3 FOLFOX 01/25/2019,Udenyca held due to bone pain ? Cycle 4 FOLFOX 02/21/2019, Udenyca added ? Cycle 5 FOLFOX 03/09/2019, Udenyca ? Cycle 6 FOLFOX 03/21/2019, Udenyca ? Cycle 7 FOLFOX 04/04/2019, Udenyca ? Cycle 8 FOLFOX 04/18/2019, Udenyca  ? Cycle 9 FOLFOX 05/02/2019, Udenyca ? Cycle 10 FOLFOX 05/16/2019, oxaliplatin, 5-FU bolus, and Udenyca held ? Cycle 11 FOLFOX 05/30/2019, oxaliplatin, 5-FU bolus, and Udenyca held ? Cycle 12 FOLFOX 06/15/2019, oxaliplatin, 5-FU bolus and Udenyca held  2. Deaf 3. Right epididymal cyst removal 03/22/2018 4. Asthma 5. Port-A-Cath placement, Dr. Wakefield, 12/23/2018 6. Neutropenia secondary to chemotherapy-Udenyca added for cycle 2 FOLFOX 7. 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    He is in clinical remission from colon cancer.  We will refer him for a completion/surveillance colonoscopy. He would like to keep the Port-A-Cath in place until after restaging CT scans this fall.  The lesion on the penis  appears to be a benign cyst.  He will schedule appointment with urology if the lesion grows or becomes painful.  Alexander Reeves will return for an office visit and Port-A-Cath flush in 6 weeks.  He has mild neutropenia.  This should improve over the next few months.  He will call for a fever or symptoms of an infection.  I encouraged him to obtain the COVID-19 vaccine.  Gary Sherrill, MD  07/06/2019  9:43 AM   

## 2019-07-06 NOTE — Patient Instructions (Signed)

## 2019-07-07 ENCOUNTER — Telehealth: Payer: Self-pay | Admitting: Oncology

## 2019-07-07 NOTE — Telephone Encounter (Signed)
Scheduled per 6/23 los. Mailing pt appt calendar.

## 2019-07-11 MED FILL — VENTOLIN HFA 90 MCG INHALER: 108 (90 BAS | 16 days supply | Qty: 18 | Fill #0

## 2019-07-13 MED FILL — PANTOPRAZOLE SOD DR 40 MG T: 40 | 30 days supply | Qty: 30 | Fill #1

## 2019-07-13 MED FILL — MONTELUKAST SOD 10 MG TAB: 10 | 90 days supply | Qty: 90 | Fill #3

## 2019-08-10 MED FILL — PANTOPRAZOLE SOD DR 40 MG T: 40 | 30 days supply | Qty: 30 | Fill #1

## 2019-08-10 MED FILL — FENOFIBRATE 145 MG TABLET: 145 | 90 days supply | Qty: 90 | Fill #3

## 2019-08-10 MED FILL — VENTOLIN HFA 90 MCG INHALER: 108 (90 BAS | 16 days supply | Qty: 18 | Fill #1

## 2019-08-17 ENCOUNTER — Telehealth: Payer: Self-pay | Admitting: Nurse Practitioner

## 2019-08-17 ENCOUNTER — Inpatient Hospital Stay: Payer: Medicare Other | Attending: Nurse Practitioner

## 2019-08-17 ENCOUNTER — Other Ambulatory Visit: Payer: Self-pay

## 2019-08-17 ENCOUNTER — Inpatient Hospital Stay (HOSPITAL_BASED_OUTPATIENT_CLINIC_OR_DEPARTMENT_OTHER): Payer: Medicare Other | Admitting: Nurse Practitioner

## 2019-08-17 ENCOUNTER — Encounter: Payer: Self-pay | Admitting: Nurse Practitioner

## 2019-08-17 VITALS — BP 137/89 | HR 77 | Temp 98.1°F | Resp 17 | Ht 70.0 in | Wt 150.6 lb

## 2019-08-17 DIAGNOSIS — G620 Drug-induced polyneuropathy: Secondary | ICD-10-CM | POA: Insufficient documentation

## 2019-08-17 DIAGNOSIS — C189 Malignant neoplasm of colon, unspecified: Secondary | ICD-10-CM

## 2019-08-17 DIAGNOSIS — Z9221 Personal history of antineoplastic chemotherapy: Secondary | ICD-10-CM | POA: Diagnosis not present

## 2019-08-17 DIAGNOSIS — Z95828 Presence of other vascular implants and grafts: Secondary | ICD-10-CM

## 2019-08-17 DIAGNOSIS — Z85038 Personal history of other malignant neoplasm of large intestine: Secondary | ICD-10-CM | POA: Insufficient documentation

## 2019-08-17 MED ORDER — HEPARIN SOD (PORK) LOCK FLUSH 100 UNIT/ML IV SOLN
500.0000 [IU] | Freq: Once | INTRAVENOUS | Status: AC | PRN
  Administered 2019-08-17: 500 [IU]
  Filled 2019-08-17: qty 5

## 2019-08-17 MED ORDER — SODIUM CHLORIDE 0.9% FLUSH
10.0000 mL | INTRAVENOUS | Status: DC | PRN
  Administered 2019-08-17: 10 mL
  Filled 2019-08-17: qty 10

## 2019-08-17 NOTE — Telephone Encounter (Signed)
Scheduled per 8/4 los. Referral sent in [facility]. Printed avs and calendar for pt.

## 2019-08-17 NOTE — Patient Instructions (Signed)

## 2019-08-17 NOTE — Progress Notes (Signed)
    [facility] OFFICE PROGRESS NOTE   Diagnosis: Colon cancer  INTERVAL HISTORY:   Alexander Reeves returns as scheduled.  He completed the final cycle of adjuvant chemotherapy June 15, 2019.  Overall he feels well.  Fairly good appetite.  No nausea or vomiting.  Bowels are moving.  He denies pain.  He continues to have neuropathy symptoms in the extremities, some improvement.  No associated pain.  Objective:  Vital signs in last 24 hours:  Blood pressure 137/89, pulse 77, temperature 98.1 F (36.7 C), temperature source Temporal, resp. rate 17, height _0  (1.778 m), weight 150 lb 9.6 oz (68.3 kg), SpO2 100 %.    HEENT: No thrush or ulcers. Lymphatics: No palpable cervical, supraclavicular, axillary or inguinal lymph nodes. Resp: Lungs clear bilaterally. Cardio: Regular rate and rhythm. GI: Abdomen soft and nontender.  No hepatomegaly. Vascular: No leg edema. Skin: Palms with hyperpigmentation. Port-A-Cath without erythema.   Lab Results:  Lab Results  Component Value Date   WBC 3.2 (L) 07/06/2019   HGB 11.8 (L) 07/06/2019   HCT 37.5 (L) 07/06/2019   MCV 92.6 07/06/2019   PLT 166 07/06/2019   NEUTROABS 1.6 (L) 07/06/2019    Imaging:  No results found.  Medications: I have reviewed the patient's current medications.  Assessment/Plan: 1. Moderately differentiated adenocarcinoma ascending colon, stage IIIb (pT3pN1), status post a right colectomy 11/19/2018 ? Lymphovascular and perineural invasion present, 2/14 lymph nodes positive, tumor deposits present ? Positive radial margin, no loss of mismatch repair protein expression, discussed case with Dr. Craig Staggers mass with surrounding inflammation at multiple margins, no gross residual disease, unclear which is the "positive "radial margin, further surgery and radiation not recommended ? Colonoscopy 11/19/2018-completely obstructing mid ascending colon mass, could not be passed with endoscope, biopsy  confirmed invasive adenocarcinoma ? CT abdomen/pelvis 11/18/2018-wall thickening at the mid and distal ascending colon with mild distention of the proximal ascending colon and cecum ? CTs 10/17/2018--no acute findings, no chest lymphadenopathy, lungs clear ? Cycle 1 FOLFOX 12/28/2018 ? Cycle 2 FOLFOX 01/11/2019, Udenyca added ? Cycle 3 FOLFOX 01/25/2019,Udenyca held due to bone pain ? Cycle 4 FOLFOX 02/21/2019, Udenyca added ? Cycle 5 FOLFOX 03/09/2019, Udenyca ? Cycle 6 FOLFOX 03/21/2019, Udenyca ? Cycle 7 FOLFOX 04/04/2019, Udenyca ? Cycle 8 FOLFOX 04/18/2019, Udenyca ? Cycle 9 FOLFOX 05/02/2019, Udenyca ? Cycle 10 FOLFOX 05/16/2019, oxaliplatin, 5-FU bolus, and Udenyca held ? Cycle 11 FOLFOX 05/30/2019, oxaliplatin, 5-FU bolus, and Udenyca held ? Cycle 12 FOLFOX 06/15/2019, oxaliplatin, 5-FU bolus and Udenyca held  2. Deaf 3. Right epididymal cyst removal 03/22/2018 4. Asthma 5. Port-A-Cath placement, [name], 12/23/2018 6. Neutropenia secondary to chemotherapy-Udenyca added for cycle 2 FOLFOX 7. 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  He is in clinical remission from colon cancer.  We referred him to Dr. Tarri Glenn for a completion/surveillance colonoscopy.  He will undergo surveillance CT scans in approximately 6 weeks, lab and flush same day.  We will see him in follow-up in a few days after the scans.  He will contact the office in the interim with any problems.    Ned Card ANP/GNP-BC   08/17/2019  10:24 AM

## 2019-08-24 ENCOUNTER — Ambulatory Visit (INDEPENDENT_AMBULATORY_CARE_PROVIDER_SITE_OTHER): Payer: Self-pay | Admitting: *Deleted

## 2019-08-24 NOTE — Telephone Encounter (Signed)
Patient called via interpreter [phone] for sign language. C/o bilateral feet swelling since last week. Recent chemo, finished last chemo 06/17/19. Left arm pain and numb. Reports bilateral hands and feet numbness at times. Blurry vision only when reading and eyes tear. Can walk on feet with pain at level 3-4. Denies redness , SOB difficulty breathing, face numbness, or weakness in left arm. appt made for 08/25/19. Patient checking to see if he can get a ride but verbalized understanding to cancel appt. If no transportation. Care advise given. Patient verbalized understanding of care advise and to go to call back or go to ED if symptoms worsen.  Reason for Disposition . [1] Small area of LOCALIZED swelling AND [2] itchy  Answer Assessment - Initial Assessment Questions 1. ONSET: "When did the swelling start?" (e.g., minutes, hours, days)     Last week 2. LOCATION: "What part of the leg is swollen?"  "Are both legs swollen or just one leg?"     Bilateral feet  3. SEVERITY: "How bad is the swelling?" (e.g., localized; mild, moderate, severe)  - Localized - small area of swelling localized to one leg  - MILD pedal edema - swelling limited to foot and ankle, pitting edema < 1/4 inch (6 mm) deep, rest and elevation eliminate most or all swelling  - MODERATE edema - swelling of lower leg to knee, pitting edema > 1/4 inch (6 mm) deep, rest and elevation only partially reduce swelling  - SEVERE edema - swelling extends above knee, facial or hand swelling present      Mild bilateral feet not ankles or legs 4. REDNESS: "Does the swelling look red or infected?"     Was some redness but now normal 5. PAIN: "Is the swelling painful to touch?" If Yes, ask: "How painful is it?"   (Scale 1-10; mild, moderate or severe)     3-4 pain level 6. FEVER: "Do you have a fever?" If Yes, ask: "What is it, how was it measured, and when did it start?"      no 7. CAUSE: "What do you think is causing the leg swelling?"     Does  not know 8. MEDICAL HISTORY: "Do you have a history of heart failure, kidney disease, liver failure, or cancer?"     Cancer 9. RECURRENT SYMPTOM: "Have you had leg swelling before?" If Yes, ask: "When was the last time?" "What happened that time?"     no 10. OTHER SYMPTOMS: "Do you have any other symptoms?" (e.g., chest pain, difficulty breathing)       Left arm pain and numbness at times. With numbness in bilateral hands and feet  Protocols used: LEG SWELLING AND EDEMA-A-AH

## 2019-08-25 ENCOUNTER — Encounter (INDEPENDENT_AMBULATORY_CARE_PROVIDER_SITE_OTHER): Payer: Self-pay | Admitting: Primary Care

## 2019-08-25 ENCOUNTER — Other Ambulatory Visit: Payer: Self-pay

## 2019-08-25 ENCOUNTER — Ambulatory Visit (INDEPENDENT_AMBULATORY_CARE_PROVIDER_SITE_OTHER): Payer: Medicare Other | Admitting: Primary Care

## 2019-08-25 VITALS — BP 129/77 | HR 108 | Temp 97.6°F | Resp 16 | Ht 70.0 in | Wt 147.0 lb

## 2019-08-25 DIAGNOSIS — C182 Malignant neoplasm of ascending colon: Secondary | ICD-10-CM

## 2019-08-25 DIAGNOSIS — E785 Hyperlipidemia, unspecified: Secondary | ICD-10-CM | POA: Diagnosis not present

## 2019-08-25 DIAGNOSIS — Z0000 Encounter for general adult medical examination without abnormal findings: Secondary | ICD-10-CM | POA: Diagnosis not present

## 2019-08-25 DIAGNOSIS — J45909 Unspecified asthma, uncomplicated: Secondary | ICD-10-CM

## 2019-08-25 DIAGNOSIS — M79671 Pain in right foot: Secondary | ICD-10-CM | POA: Diagnosis not present

## 2019-08-25 DIAGNOSIS — Z760 Encounter for issue of repeat prescription: Secondary | ICD-10-CM

## 2019-08-25 DIAGNOSIS — Z1159 Encounter for screening for other viral diseases: Secondary | ICD-10-CM

## 2019-08-25 DIAGNOSIS — M2550 Pain in unspecified joint: Secondary | ICD-10-CM | POA: Diagnosis not present

## 2019-08-25 DIAGNOSIS — M79672 Pain in left foot: Secondary | ICD-10-CM | POA: Diagnosis not present

## 2019-08-25 MED ORDER — MONTELUKAST SODIUM 10 MG PO TABS: 10.0000 mg | ORAL_TABLET | Freq: Every day | ORAL | 1 refills | Status: DC

## 2019-08-25 MED ORDER — IBUPROFEN 600 MG PO TABS: 600.0000 mg | ORAL_TABLET | Freq: Three times a day (TID) | ORAL | 0 refills | Status: DC | PRN

## 2019-08-25 MED FILL — IBUPROFEN 600 MG TABLET: 600 | 30 days supply | Qty: 90 | Fill #0

## 2019-08-25 NOTE — Progress Notes (Signed)
Established Patient Office Visit  Subjective:  Patient ID: Alexander Reeves., male    DOB: September 06, 1965  Age: 54 y.o. MRN: [id number]  CC: No chief complaint on file.   HPI [name] 54  year old deaf patient presents for jpint pain and back pain. Medication refill and the management of HTN. Denies shortness of breath, headaches, chest pain or lower extremity edema   Past Medical History:  Diagnosis Date  . Arthritis    KNEES  . Asthma   . Deaf   . GERD (gastroesophageal reflux disease)    OCC  . Scrotal cyst   . Thyroid disease   . Urinary frequency    OCC    Past Surgical History:  Procedure Laterality Date  . BIOPSY  11/19/2018   Procedure: BIOPSY;  Surgeon: Thornton Park, MD;  Location: [place];  Service: Gastroenterology;;  . BLADDER SURGERY  YRS AGO  . COLONOSCOPY WITH PROPOFOL N/A 11/19/2018   Procedure: COLONOSCOPY WITH PROPOFOL;  Surgeon: Thornton Park, MD;  Location: [place];  Service: Gastroenterology;  Laterality: N/A;  . EPIDIDYMECTOMY Right 03/22/2018   Procedure: SCROTAL EXPLORATION RIGHT  PARTIAL EPIDIDYMECTOMY;  Surgeon: [name], MD;  Location: [facility];  Service: Urology;  Laterality: Right;  . HEMORRHOID SURGERY  WHEN YOUNG  . LAPAROTOMY N/A 11/19/2018   Procedure: EXPLORATORY LAPAROTOMY;  Surgeon: Rolm Bookbinder, MD;  Location: [place];  Service: General;  Laterality: N/A;  . NO PAST SURGERIES    . PARTIAL COLECTOMY Right 11/19/2018   Procedure: PARTIAL COLECTOMY;  Surgeon: Rolm Bookbinder, MD;  Location: [place];  Service: General;  Laterality: Right;  . PORTACATH PLACEMENT Right 12/23/2018   Procedure: INSERTION PORT-A-CATH WITH ULTRASOUND GUIDANCE;  Surgeon: Rolm Bookbinder, MD;  Location: [place];  Service: General;  Laterality: Right;  . SUBMUCOSAL TATTOO INJECTION  11/19/2018   Procedure: SUBMUCOSAL TATTOO INJECTION;  Surgeon: Thornton Park, MD;  Location: [facility]  ENDOSCOPY;  Service: Gastroenterology;;  . Alexander Reeves AGO    Family History  Problem Relation Age of Onset  . Heart disease Father     Social History   Socioeconomic History  . Marital status: Single    Spouse name: Not on file  . Number of children: Not on file  . Years of education: Not on file  . Highest education level: Not on file  Occupational History  . Not on file  Tobacco Use  . Smoking status: Former Smoker    Types: Cigarettes    Quit date: 06/24/2016    Years since quitting: 3.1  . Smokeless tobacco: Never Used  Vaping Use  . Vaping Use: Never used  Substance and Sexual Activity  . Alcohol use: Yes    Comment: OCC  . Drug use: Yes    Types: Marijuana    Comment: weekends (last fri and sat)  . Sexual activity: Never    Birth control/protection: None  Other Topics Concern  . Not on file  Social History Narrative   [name] (09/01/16):   No new pets. Moved from [place]. Questionable mold exposure at his previous residence and possibly some at his new apartment as well.    Social Determinants of Health   Financial Resource Strain:   . Difficulty of Paying Living Expenses:   Food Insecurity:   . Worried About Charity fundraiser in the Last Year:   . [place] in the Last Year:  Transportation Needs:   . Film/video editor (Medical):   [name] Lack of Transportation (Non-Medical):   Physical Activity:   . Days of Exercise per Week:   . Minutes of Exercise per Session:   Stress:   . Feeling of Stress :   Social Connections:   . Frequency of Communication with Friends and Family:   . Frequency of Social Gatherings with Friends and Family:   . Attends Religious Services:   . Active Member of Clubs or Organizations:   . Attends Archivist Meetings:   [name] Marital Status:   Intimate Partner Violence:   . Fear of Current or Ex-Partner:   . Emotionally Abused:   [name] Physically Abused:   . Sexually Abused:     Outpatient Medications  Prior to Visit  Medication Sig Dispense Refill  . albuterol (PROVENTIL) (2.5 MG/3ML) 0.083% nebulizer solution Take 3 mLs (2.5 mg total) by nebulization every 6 (six) hours as needed for wheezing or shortness of breath. 75 mL 12  . albuterol (VENTOLIN HFA) 108 (90 Base) MCG/ACT inhaler Inhale 2 puffs into the lungs every 4 (four) hours as needed for wheezing or shortness of breath. 6.7 g 3  . fluticasone furoate-vilanterol (BREO ELLIPTA) 200-25 MCG/INH AEPB Inhale 1 puff into the lungs daily. 60 each 11  . acetaminophen (TYLENOL) 325 MG tablet Take 2 tablets (650 mg total) by mouth every 6 (six) hours as needed for mild pain (or Fever >/= 101). (Patient not taking: Reported on 08/25/2019)    . fenofibrate (TRICOR) 145 MG tablet Take 1 tablet (145 mg total) by mouth daily. (Patient not taking: Reported on 08/25/2019) 90 tablet 3  . lidocaine-prilocaine (EMLA) cream Apply 1 application topically as directed. Apply 1 hour prior to stick and cover with plastic wrap (Patient not taking: Reported on 08/25/2019) 30 g 1  . ondansetron (ZOFRAN) 8 MG tablet Take 1 tablet (8 mg total) by mouth every 8 (eight) hours as needed for nausea or vomiting. Start 72 hours after IV chemo treatment day (Patient not taking: Reported on 07/06/2019) 30 tablet 1  . pantoprazole (PROTONIX) 40 MG tablet Take 1 tablet (40 mg total) by mouth daily. 30 tablet 3  . Phenazopyridine HCl (AZO TABS PO) Take 1 tablet by mouth as needed.    . prochlorperazine (COMPAZINE) 10 MG tablet Take 1 tablet (10 mg total) by mouth every 6 (six) hours as needed for nausea. (Patient not taking: Reported on 07/06/2019) 60 tablet 1  . sulfamethoxazole-trimethoprim (BACTRIM DS) 800-160 MG tablet Take 1 tablet by mouth daily. (Patient not taking: Reported on 08/17/2019)    . montelukast (SINGULAIR) 10 MG tablet Take 1 tablet (10 mg total) by mouth at bedtime. 30 tablet 11   No facility-administered medications prior to visit.    Allergies  Allergen Reactions   . Cheese Shortness Of Breath and Swelling    Swelling of the throat  . Eggs Or Egg-Derived Products Shortness Of Breath and Swelling    Swelling of the throat  . Penicillins Anaphylaxis    Has patient had a PCN reaction causing immediate rash, facial/tongue/throat swelling, SOB or lightheadedness with hypotension: Yes Has patient had a PCN reaction causing severe rash involving mucus membranes or skin necrosis: Unknown Has patient had a PCN reaction that required hospitalization: Unknown Has patient had a PCN reaction occurring within the last 10 years: Unknown If all of the above answers are "NO", then may proceed with Cephalosporin use.     ROS Review of  Systems  Musculoskeletal: Positive for arthralgias, back pain and myalgias.  All other systems reviewed and are negative.     Objective:    Physical Exam Vitals reviewed.  Constitutional:      Comments: Thin frame  HENT:     Head: Normocephalic.     Right Ear: Tympanic membrane normal.     Left Ear: Tympanic membrane normal.     Nose: Nose normal.  Eyes:     Extraocular Movements: Extraocular movements intact.     Pupils: Pupils are equal, round, and reactive to light.  Cardiovascular:     Rate and Rhythm: Normal rate and regular rhythm.     Pulses: Normal pulses.     Heart sounds: Normal heart sounds.  Pulmonary:     Effort: Pulmonary effort is normal.     Breath sounds: Normal breath sounds.  Abdominal:     General: Abdomen is flat.  Musculoskeletal:     Cervical back: Normal range of motion and neck supple.  Skin:    General: Skin is warm and dry.  Neurological:     Mental Status: He is alert and oriented to person, place, and time.  Psychiatric:        Mood and Affect: Mood normal.        Behavior: Behavior normal.        Thought Content: Thought content normal.        Judgment: Judgment normal.    Vitals:   08/25/19 1016  BP: 129/77  Pulse: (!) 108  Resp: 16  Temp: 97.6 F (36.4 C)  SpO2: 96%   Weight: 147 lb (66.7 kg)  Height: '5\' 10"'  (1.778 m)   General: Vital signs reviewed.  Patient is well-developed and well-nourished,thin frame male  in no acute distress and cooperative with exam.  Head: Normocephalic and atraumatic. Eyes: EOMI, conjunctivae normal, no scleral icterus.  Neck: Supple, trachea midline, normal ROM, no JVD, masses, thyromegaly, or carotid bruit present.  Cardiovascular: RRR, S1 normal, S2 normal, no murmurs, gallops, or rubs. Pulmonary/Chest: Clear to auscultation bilaterally, no wheezes, rales, or rhonchi. Abdominal: Soft, non-tender, non-distended, BS +, no masses, organomegaly, or guarding present.  Musculoskeletal: No joint deformities, erythema, or stiffness, ROM full and nontender. Extremities: No lower extremity edema bilaterally,  pulses symmetric and intact bilaterally. No cyanosis or clubbing. Neurological: A&O x3, Strength is normal and symmetric bilaterally, cranial nerve II-XII are grossly intact, no focal motor deficit, sensory intact to light touch bilaterally.  Skin: Warm, dry and intact. No rashes or erythema. Psychiatric: Normal mood and affect. speech and behavior is normal. Cognition and memory are normal.   Wt Readings from Last 3 Encounters:  08/25/19 147 lb (66.7 kg)  08/17/19 150 lb 9.6 oz (68.3 kg)  07/06/19 148 lb 3.2 oz (67.2 kg)     Health Maintenance Due  Topic Date Due  . Hepatitis C Screening  Never done  . COVID-19 Vaccine (1) Never done  . INFLUENZA VACCINE  08/14/2019    There are no preventive care reminders to display for this patient.  Lab Results  Component Value Date   TSH 0.702 09/01/2016   Lab Results  Component Value Date   WBC 3.2 (L) 07/06/2019   HGB 11.8 (L) 07/06/2019   HCT 37.5 (L) 07/06/2019   MCV 92.6 07/06/2019   PLT 166 07/06/2019   Lab Results  Component Value Date   NA 140 06/15/2019   K 4.3 06/15/2019   CO2 22 06/15/2019   GLUCOSE 101 (  H) 06/15/2019   BUN 11 06/15/2019   CREATININE  1.29 (H) 06/15/2019   BILITOT 0.3 06/15/2019   ALKPHOS 65 06/15/2019   AST 17 06/15/2019   ALT 10 06/15/2019   PROT 7.2 06/15/2019   ALBUMIN 4.2 06/15/2019   CALCIUM 9.3 06/15/2019   ANIONGAP 10 06/15/2019   Lab Results  Component Value Date   CHOL 223 (H) 10/11/2018   Lab Results  Component Value Date   HDL 38 (L) 10/11/2018   Lab Results  Component Value Date   LDLCALC 129 (H) 10/11/2018   Lab Results  Component Value Date   TRIG 316 (H) 10/11/2018   Lab Results  Component Value Date   CHOLHDL 5.9 (H) 10/11/2018   No results found for: HGBA1C    Assessment & Plan:   Diagnoses and all orders for this visit:  Arthralgia, unspecified joint Prescribed 646m 3 times a day as needed for pain  -     Rheumatoid Arthritis Profile  Medication refill -     montelukast (SINGULAIR) 10 MG tablet; Take 1 tablet (10 mg total) by mouth at bedtime.  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    MKerin Perna NP

## 2019-08-25 NOTE — Progress Notes (Signed)
c /o Bl foot edema, and plantar pain X 2 months  Mid  side lower back pain on ad off particularly when sleeping

## 2019-08-26 ENCOUNTER — Telehealth: Payer: Self-pay

## 2019-08-26 ENCOUNTER — Other Ambulatory Visit: Payer: Self-pay

## 2019-08-26 ENCOUNTER — Other Ambulatory Visit (INDEPENDENT_AMBULATORY_CARE_PROVIDER_SITE_OTHER): Payer: Self-pay | Admitting: Primary Care

## 2019-08-26 DIAGNOSIS — C189 Malignant neoplasm of colon, unspecified: Secondary | ICD-10-CM

## 2019-08-26 MED ORDER — GEMFIBROZIL 600 MG PO TABS: 600.0000 mg | ORAL_TABLET | Freq: Two times a day (BID) | ORAL | 1 refills | Status: DC

## 2019-08-26 MED FILL — GEMFIBROZIL 600 MG TAB: 600 | 90 days supply | Qty: 180 | Fill #0

## 2019-08-26 NOTE — Telephone Encounter (Signed)
-----   Message from [name], NP sent at 08/26/2019 12:51 PM EDT ----- Triglycerides 0 - 149 mg/dL 168 High   is elevated send in gemfibrozil 600 mg twice daily

## 2019-08-26 NOTE — Telephone Encounter (Signed)
Patient was called and a voicemail was left informing patient of lab results and medication being sent to pharmacy.

## 2019-08-26 NOTE — Telephone Encounter (Signed)
-----   Message from Algernon Huxley, RN sent at 08/03/2019  2:58 PM EDT ----- Regarding: Hospital colon Pt needs to be scheduled for hospital colon with Dr. Tarri Glenn

## 2019-08-26 NOTE — Telephone Encounter (Signed)
Pt scheduled for previsit 09/07/19@2 :30pm, covid screen scheduled 09/16/19@10am , Colon scheduled at [facility] 09/20/19 @11am . Left message for pt to call back.

## 2019-08-26 NOTE — Telephone Encounter (Signed)
Patient notified of results and PCP recommendations. Patient will start new medication

## 2019-08-27 LAB — CBC WITH DIFFERENTIAL/PLATELET
Basophils Absolute: 0 10*3/uL (ref 0.0–0.2)
Basos: 1 %
EOS (ABSOLUTE): 0.2 10*3/uL (ref 0.0–0.4)
Eos: 4 %
Hematocrit: 43.6 % (ref 37.5–51.0)
Hemoglobin: 14.1 g/dL (ref 13.0–17.7)
Immature Grans (Abs): 0 10*3/uL (ref 0.0–0.1)
Immature Granulocytes: 0 %
Lymphocytes Absolute: 1.1 10*3/uL (ref 0.7–3.1)
Lymphs: 26 %
MCH: 28.8 pg (ref 26.6–33.0)
MCHC: 32.3 g/dL (ref 31.5–35.7)
MCV: 89 fL (ref 79–97)
Monocytes Absolute: 0.4 10*3/uL (ref 0.1–0.9)
Monocytes: 10 %
Neutrophils Absolute: 2.4 10*3/uL (ref 1.4–7.0)
Neutrophils: 59 %
Platelets: 215 10*3/uL (ref 150–450)
RBC: 4.89 x10E6/uL (ref 4.14–5.80)
RDW: 12.6 % (ref 11.6–15.4)
WBC: 4.1 10*3/uL (ref 3.4–10.8)

## 2019-08-27 LAB — CMP14+EGFR
ALT: 13 IU/L (ref 0–44)
AST: 20 IU/L (ref 0–40)
Albumin/Globulin Ratio: 1.7 (ref 1.2–2.2)
Albumin: 4.7 g/dL (ref 3.8–4.9)
Alkaline Phosphatase: 51 IU/L (ref 48–121)
BUN/Creatinine Ratio: 14 (ref 9–20)
BUN: 17 mg/dL (ref 6–24)
Bilirubin Total: 0.2 mg/dL (ref 0.0–1.2)
CO2: 26 mmol/L (ref 20–29)
Calcium: 10 mg/dL (ref 8.7–10.2)
Chloride: 107 mmol/L — ABNORMAL HIGH (ref 96–106)
Creatinine, Ser: 1.25 mg/dL (ref 0.76–1.27)
GFR calc Af Amer: 76 mL/min/{1.73_m2} (ref >59)
GFR calc non Af Amer: 66 mL/min/{1.73_m2} (ref >59)
Globulin, Total: 2.8 g/dL (ref 1.5–4.5)
Glucose: 100 mg/dL — ABNORMAL HIGH (ref 65–99)
Potassium: 4.7 mmol/L (ref 3.5–5.2)
Sodium: 143 mmol/L (ref 134–144)
Total Protein: 7.5 g/dL (ref 6.0–8.5)

## 2019-08-27 LAB — RHEUMATOID ARTHRITIS PROFILE
Cyclic Citrullin Peptide Ab: 7 units (ref 0–19)
Rheumatoid fact SerPl-aCnc: <10 IU/mL (ref 0.0–13.9)

## 2019-08-27 LAB — LIPID PANEL
Chol/HDL Ratio: 3 ratio (ref 0.0–5.0)
Cholesterol, Total: 189 mg/dL (ref 100–199)
HDL: 63 mg/dL (ref >39)
LDL Chol Calc (NIH): 97 mg/dL (ref 0–99)
Triglycerides: 168 mg/dL — ABNORMAL HIGH (ref 0–149)
VLDL Cholesterol Cal: 29 mg/dL (ref 5–40)

## 2019-08-27 LAB — HEPATITIS C ANTIBODY: Hep C Virus Ab: <0.1 s/co ratio (ref 0.0–0.9)

## 2019-08-31 NOTE — Telephone Encounter (Signed)
Pt notified of appts via Relay call. No questions regarding appts.

## 2019-09-05 MED FILL — VENTOLIN HFA 90 MCG INHALER: 108 (90 BAS | 16 days supply | Qty: 18 | Fill #2

## 2019-09-06 ENCOUNTER — Telehealth: Payer: Self-pay

## 2019-09-06 NOTE — Telephone Encounter (Signed)
Previsit rescheduled to 9/2@10am  to allow an hour slot as pt will need and interpreter. Called pt and had message left for pt that appt has been changed.

## 2019-09-07 ENCOUNTER — Telehealth: Payer: Self-pay | Admitting: *Deleted

## 2019-09-07 NOTE — Telephone Encounter (Addendum)
VM from sign language service that patient requesting return call. Called back and left VM that RN will call again at 2pm today. Spoke w/patient via deaf interpreter line: He is reporting hemorrhoids and wants to be sure the gastroenterologist can examine him for this when he has his colonoscopy on 09/20/19. Confirmed w/him that his hemmorroids will be visualized with the exam that day.

## 2019-09-12 ENCOUNTER — Telehealth: Payer: Self-pay | Admitting: Podiatry

## 2019-09-12 ENCOUNTER — Ambulatory Visit (INDEPENDENT_AMBULATORY_CARE_PROVIDER_SITE_OTHER): Payer: Medicare Other | Admitting: Podiatry

## 2019-09-12 ENCOUNTER — Ambulatory Visit (INDEPENDENT_AMBULATORY_CARE_PROVIDER_SITE_OTHER): Payer: Medicare Other

## 2019-09-12 ENCOUNTER — Encounter: Payer: Self-pay | Admitting: Podiatry

## 2019-09-12 ENCOUNTER — Other Ambulatory Visit: Payer: Self-pay

## 2019-09-12 DIAGNOSIS — G620 Drug-induced polyneuropathy: Secondary | ICD-10-CM

## 2019-09-12 DIAGNOSIS — M7742 Metatarsalgia, left foot: Secondary | ICD-10-CM | POA: Diagnosis not present

## 2019-09-12 DIAGNOSIS — M778 Other enthesopathies, not elsewhere classified: Secondary | ICD-10-CM | POA: Diagnosis not present

## 2019-09-12 DIAGNOSIS — M7741 Metatarsalgia, right foot: Secondary | ICD-10-CM

## 2019-09-12 MED ORDER — LIDOCAINE-PRILOCAINE 2.5-2.5 % EX CREA
1.0000 | TOPICAL_CREAM | CUTANEOUS | 2 refills | Status: DC | PRN
Start: 2019-09-12 — End: 2020-06-12

## 2019-09-12 NOTE — Patient Instructions (Signed)
Metatarsal pads can be purchased on [employer] or at a foot supply store

## 2019-09-12 NOTE — Telephone Encounter (Signed)
Patient called via sign language interpreter. He forgot name of cream and band-aid he was instructed to purchase. Please call sign language interpreter back at [phone].

## 2019-09-12 NOTE — Progress Notes (Signed)
  Subjective:  Patient ID: Alexander Reeves., male    DOB: 12/01/1965,  MRN: [id number]  Chief Complaint  Patient presents with  . Foot Pain    bilateral, right worse than left across the ball of the foot x 2 weeks. Tried soaking but still hurts every morning    54 y.o. male presents with the above complaint. History confirmed with patient.  Most of the foot pain and swelling began after finishing his chemotherapy treatment for colon cancer.  It is worse when he is walking.  He localizes it to the first and second plantar MTPJ's.  Video interpreter provides translation from ASL.  Objective:  Physical Exam: warm, good capillary refill, no trophic changes or ulcerative lesions, normal DP and PT pulses  Left Foot: Mild tenderness palpation to metatarsal heads 1-3.  No pinpoint pain to suggest sesamoiditis Right Foot: Mild tenderness to palpation to metatarsal heads 1-3.  No pinpoint pain to suggest sesamoiditis   Radiographs: X-ray of both feet: no fracture, dislocation, swelling or degenerative changes noted Assessment:   1. Capsulitis of foot, right   2. Capsulitis of foot, left   3. Metatarsalgia of both feet   4. Neuropathy due to chemotherapeutic drug [facility])      Plan:  Patient was evaluated and treated and all questions answered.   -Discussed with him that most of his pain is due to thinning of the natural fat pad of the metatarsal heads.  He is having metatarsalgia pain, worsened by likely neuropathy from chemotherapy.  Recommended he try metatarsal pad support, which was dispensed today as well as topical treatment with lidocaine prilocaine cream which a prescription was written for.  The metatarsal pad support is helpful we will consider orthotics with this built in.  Return in about 2 months (around 11/12/2019).

## 2019-09-15 ENCOUNTER — Other Ambulatory Visit: Payer: Self-pay

## 2019-09-15 ENCOUNTER — Ambulatory Visit (AMBULATORY_SURGERY_CENTER): Payer: Self-pay | Admitting: *Deleted

## 2019-09-15 ENCOUNTER — Telehealth: Payer: Self-pay | Admitting: *Deleted

## 2019-09-15 DIAGNOSIS — Z01818 Encounter for other preprocedural examination: Secondary | ICD-10-CM

## 2019-09-15 DIAGNOSIS — Z85038 Personal history of other malignant neoplasm of large intestine: Secondary | ICD-10-CM

## 2019-09-15 NOTE — Telephone Encounter (Signed)
Delight Stare,  I saw Mr Bethard in a pre-visit today for an up-coming colonoscopy as a follow-up to colon cancer. Mr Alexander Reeves is having symptoms that sound consistent for a kidney stone, dull flank pain.  He is wanting to see if there is any tests that can be run or a u/a.  He will be at [facility] for the colonoscopy Tuesday 09/20/19. He seemed to have a lot of anxiety about this, and with being deaf it was hard to communicate. I was wondering if you could please reach out to him and either set up an appointment or maybe order appropriate labs or  u/a for him ( possibly when he is already at [facility] for his procedure).  Thanks so much!  Christell Constant, RN

## 2019-09-15 NOTE — Progress Notes (Signed)
No egg or soy allergy known to patient  No issues with past sedation with any surgeries or procedures no intubation problems in the past  No FH of Malignant Hyperthermia No diet pills per patient No home 02 use per patient  No blood thinners per patient  Pt denies issues with constipation  No A fib or A flutter  EMMI video to pt or via MyChart  COVID 19 guidelines implemented in PV today with Pt and RN   Sample of SUTAB provided.  Due to the COVID-19 pandemic we are asking patients to follow these guidelines. Please only bring one care partner. Please be aware that your care partner may wait in the car in the parking lot or if they feel like they will be too hot to wait in the car, they may wait in the lobby on the 4th floor. All care partners are required to wear a mask the entire time (we do not have any that we can provide them), they need to practice social distancing, and we will do a Covid check for all patient's and care partners when you arrive. Also we will check their temperature and your temperature. If the care partner waits in their car they need to stay in the parking lot the entire time and we will call them on their cell phone when the patient is ready for discharge so they can bring the car to the front of the building. Also all patient's will need to wear a mask into building.

## 2019-09-15 NOTE — Progress Notes (Signed)
SUTAB SAMPLE PROVIDED LOG [id number] exp 03/2020

## 2019-09-16 ENCOUNTER — Other Ambulatory Visit (HOSPITAL_COMMUNITY)
Admission: RE | Admit: 2019-09-16 | Discharge: 2019-09-16 | Disposition: A | Discharge status: Home / Self Care | Payer: Medicare Other | Source: Ambulatory Visit | Attending: Gastroenterology | Admitting: Gastroenterology

## 2019-09-16 DIAGNOSIS — Z01812 Encounter for preprocedural laboratory examination: Secondary | ICD-10-CM | POA: Insufficient documentation

## 2019-09-16 DIAGNOSIS — Z20822 Contact with and (suspected) exposure to covid-19: Secondary | ICD-10-CM | POA: Insufficient documentation

## 2019-09-16 LAB — SARS CORONAVIRUS 2 (TAT 6-24 HRS): SARS Coronavirus 2: NEGATIVE

## 2019-09-20 ENCOUNTER — Other Ambulatory Visit: Payer: Self-pay

## 2019-09-20 ENCOUNTER — Encounter (HOSPITAL_COMMUNITY)
Admission: RE | Disposition: A | Discharge status: Home / Self Care | Payer: Self-pay | Source: Ambulatory Visit | Attending: Gastroenterology

## 2019-09-20 ENCOUNTER — Ambulatory Visit (HOSPITAL_COMMUNITY): Payer: Medicare Other | Admitting: Certified Registered Nurse Anesthetist

## 2019-09-20 ENCOUNTER — Encounter (HOSPITAL_COMMUNITY): Payer: Self-pay | Admitting: Gastroenterology

## 2019-09-20 ENCOUNTER — Ambulatory Visit (HOSPITAL_COMMUNITY)
Admission: RE | Admit: 2019-09-20 | Discharge: 2019-09-20 | Disposition: A | Discharge status: Home / Self Care | Payer: Medicare Other | Source: Ambulatory Visit | Attending: Gastroenterology | Admitting: Gastroenterology

## 2019-09-20 DIAGNOSIS — K289 Gastrojejunal ulcer, unspecified as acute or chronic, without hemorrhage or perforation: Secondary | ICD-10-CM

## 2019-09-20 DIAGNOSIS — H9190 Unspecified hearing loss, unspecified ear: Secondary | ICD-10-CM | POA: Insufficient documentation

## 2019-09-20 DIAGNOSIS — K635 Polyp of colon: Secondary | ICD-10-CM

## 2019-09-20 DIAGNOSIS — Z85038 Personal history of other malignant neoplasm of large intestine: Secondary | ICD-10-CM

## 2019-09-20 DIAGNOSIS — Z87891 Personal history of nicotine dependence: Secondary | ICD-10-CM | POA: Insufficient documentation

## 2019-09-20 DIAGNOSIS — K6289 Other specified diseases of anus and rectum: Secondary | ICD-10-CM | POA: Diagnosis not present

## 2019-09-20 DIAGNOSIS — M170 Bilateral primary osteoarthritis of knee: Secondary | ICD-10-CM | POA: Insufficient documentation

## 2019-09-20 DIAGNOSIS — K5730 Diverticulosis of large intestine without perforation or abscess without bleeding: Secondary | ICD-10-CM | POA: Diagnosis not present

## 2019-09-20 DIAGNOSIS — Z980 Intestinal bypass and anastomosis status: Secondary | ICD-10-CM | POA: Diagnosis not present

## 2019-09-20 DIAGNOSIS — Z9221 Personal history of antineoplastic chemotherapy: Secondary | ICD-10-CM | POA: Diagnosis not present

## 2019-09-20 DIAGNOSIS — D122 Benign neoplasm of ascending colon: Secondary | ICD-10-CM | POA: Diagnosis not present

## 2019-09-20 DIAGNOSIS — C189 Malignant neoplasm of colon, unspecified: Secondary | ICD-10-CM

## 2019-09-20 DIAGNOSIS — Z1211 Encounter for screening for malignant neoplasm of colon: Secondary | ICD-10-CM | POA: Insufficient documentation

## 2019-09-20 DIAGNOSIS — J45909 Unspecified asthma, uncomplicated: Secondary | ICD-10-CM | POA: Diagnosis not present

## 2019-09-20 HISTORY — PX: POLYPECTOMY: SHX5525

## 2019-09-20 HISTORY — PX: COLONOSCOPY WITH PROPOFOL: SHX5780

## 2019-09-20 HISTORY — PX: BIOPSY: SHX5522

## 2019-09-20 SURGERY — COLONOSCOPY WITH PROPOFOL
Anesthesia: Monitor Anesthesia Care

## 2019-09-20 MED ORDER — PROPOFOL 500 MG/50ML IV EMUL
INTRAVENOUS | Status: DC | PRN
  Administered 2019-09-20: 30 ug/kg/min via INTRAVENOUS

## 2019-09-20 MED ORDER — LIDOCAINE 2% (20 MG/ML) 5 ML SYRINGE
INTRAMUSCULAR | Status: DC | PRN
  Administered 2019-09-20: 70 mg via INTRAVENOUS

## 2019-09-20 MED ORDER — PROPOFOL 500 MG/50ML IV EMUL
INTRAVENOUS | Status: AC
  Filled 2019-09-20: qty 50

## 2019-09-20 MED ORDER — PROPOFOL 1000 MG/100ML IV EMUL
INTRAVENOUS | Status: AC
  Filled 2019-09-20: qty 300

## 2019-09-20 MED ORDER — PROPOFOL 10 MG/ML IV BOLUS
INTRAVENOUS | Status: DC | PRN
  Administered 2019-09-20: 125 ug/kg/min via INTRAVENOUS

## 2019-09-20 MED ORDER — SODIUM CHLORIDE 0.9 % IV SOLN: INTRAVENOUS | Status: DC

## 2019-09-20 MED ORDER — LACTATED RINGERS IV SOLN
INTRAVENOUS | Status: AC | PRN
  Administered 2019-09-20: 1000 mL via INTRAVENOUS

## 2019-09-20 SURGICAL SUPPLY — 21 items

## 2019-09-20 NOTE — Anesthesia Postprocedure Evaluation (Signed)
Anesthesia Post Note  Patient: Alexander BLUITT Sr.  Procedure(s) Performed: COLONOSCOPY WITH PROPOFOL (N/A ) POLYPECTOMY BIOPSY     Patient location during evaluation: PACU Anesthesia Type: MAC Level of consciousness: awake and alert Pain management: pain level controlled Vital Signs Assessment: post-procedure vital signs reviewed and stable Respiratory status: spontaneous breathing, nonlabored ventilation, respiratory function stable and patient connected to nasal cannula oxygen Cardiovascular status: stable and blood pressure returned to baseline Postop Assessment: no apparent nausea or vomiting Anesthetic complications: no   No complications documented.  Last Vitals:  Vitals:   09/20/19 1245 09/20/19 1250  BP:  119/85  Pulse:  77  Resp:  14  Temp:    SpO2: 100% 97%    Last Pain:  Vitals:   09/20/19 1250  TempSrc:   PainSc: 0-No pain                 Effie Berkshire

## 2019-09-20 NOTE — Progress Notes (Signed)
Pt used his phone to video call for ride. Pt transferred out of Endo via wc with belongings as ride is outside building waiting for him. Wife and Interpreter with pt.

## 2019-09-20 NOTE — Discharge Instructions (Signed)
YOU HAD AN ENDOSCOPIC PROCEDURE TODAY: Refer to the procedure report and other information in the discharge instructions given to you for any specific questions about what was found during the examination. If this information does not answer your questions, please call [place] office at [phone] to clarify.  ° °YOU SHOULD EXPECT: Some feelings of bloating in the abdomen. Passage of more gas than usual. Walking can help get rid of the air that was put into your GI tract during the procedure and reduce the bloating. If you had a lower endoscopy (such as a colonoscopy or flexible sigmoidoscopy) you may notice spotting of blood in your stool or on the toilet paper. Some abdominal soreness may be present for a day or two, also. ° °DIET: Your first meal following the procedure should be a light meal and then it is ok to progress to your normal diet. A half-sandwich or bowl of soup is an example of a good first meal. Heavy or fried foods are harder to digest and may make you feel nauseous or bloated. Drink plenty of fluids but you should avoid alcoholic beverages for 24 hours. If you had a esophageal dilation, please see attached instructions for diet.   ° °ACTIVITY: Your care partner should take you home directly after the procedure. You should plan to take it easy, moving slowly for the rest of the day. You can resume normal activity the day after the procedure however YOU SHOULD NOT DRIVE, use power tools, machinery or perform tasks that involve climbing or major physical exertion for 24 hours (because of the sedation medicines used during the test).  ° °SYMPTOMS TO REPORT IMMEDIATELY: °A gastroenterologist can be reached at any hour. Please call [phone]  for any of the following symptoms:  °Following lower endoscopy (colonoscopy, flexible sigmoidoscopy) °Excessive amounts of blood in the stool  °Significant tenderness, worsening of abdominal pains  °Swelling of the abdomen that is new, acute  °Fever of 100° or  higher  °Following upper endoscopy (EGD, EUS, ERCP, esophageal dilation) °Vomiting of blood or coffee ground material  °New, significant abdominal pain  °New, significant chest pain or pain under the shoulder blades  °Painful or persistently difficult swallowing  °New shortness of breath  °Black, tarry-looking or red, bloody stools ° °FOLLOW UP:  °If any biopsies were taken you will be contacted by phone or by letter within the next 1-3 weeks. Call [phone]  if you have not heard about the biopsies in 3 weeks.  °Please also call with any specific questions about appointments or follow up tests. ° °

## 2019-09-20 NOTE — Anesthesia Preprocedure Evaluation (Addendum)
Anesthesia Evaluation  Patient identified by MRN, date of birth, ID band Patient awake    Reviewed: Allergy & Precautions, NPO status , Patient's Chart, lab work & pertinent test results  Airway Mallampati: I  TM Distance: >3 FB Neck ROM: Full    Dental  (+) Teeth Intact, Dental Advisory Given   Pulmonary asthma , former smoker,    breath sounds clear to auscultation       Cardiovascular negative cardio ROS   Rhythm:Regular Rate:Normal     Neuro/Psych negative neurological ROS  negative psych ROS   GI/Hepatic Neg liver ROS, GERD  Medicated,  Endo/Other  negative endocrine ROS  Renal/GU negative Renal ROS     Musculoskeletal  (+) Arthritis ,   Abdominal Normal abdominal exam  (+)   Peds  Hematology negative hematology ROS (+)   Anesthesia Other Findings - HLD  Reproductive/Obstetrics                            Anesthesia Physical Anesthesia Plan  ASA: II  Anesthesia Plan: MAC   Post-op Pain Management:    Induction: Intravenous  PONV Risk Score and Plan: Propofol infusion  Airway Management Planned: Natural Airway and Simple Face Mask  Additional Equipment: None  Intra-op Plan:   Post-operative Plan:   Informed Consent: I have reviewed the patients History and Physical, chart, labs and discussed the procedure including the risks, benefits and alternatives for the proposed anesthesia with the patient or authorized representative who has indicated his/her understanding and acceptance.       Plan Discussed with: CRNA  Anesthesia Plan Comments: (Lab Results      Component                Value               Date                      WBC                      4.1                 08/25/2019                HGB                      14.1                08/25/2019                HCT                      43.6                08/25/2019                MCV                      89                   08/25/2019                PLT                      215  08/25/2019           )        Anesthesia Quick Evaluation

## 2019-09-20 NOTE — Anesthesia Procedure Notes (Signed)
Procedure Name: MAC Date/Time: 09/20/2019 11:51 AM Performed by: West Pugh, CRNA Pre-anesthesia Checklist: Patient identified, Emergency Drugs available, Suction available, Patient being monitored and Timeout performed Patient Re-evaluated:Patient Re-evaluated prior to induction Oxygen Delivery Method: Simple face mask Preoxygenation: Pre-oxygenation with 100% oxygen Induction Type: IV induction Placement Confirmation: positive ETCO2 Dental Injury: Teeth and Oropharynx as per pre-operative assessment

## 2019-09-20 NOTE — Progress Notes (Signed)
Dr. Tarri Glenn at bedside to speak with pt and wife using bedside sign language interpreter.

## 2019-09-20 NOTE — Transfer of Care (Signed)
Immediate Anesthesia Transfer of Care Note  Patient: Alexander SCHARA Sr.  Procedure(s) Performed: COLONOSCOPY WITH PROPOFOL (N/A ) POLYPECTOMY BIOPSY  Patient Location: PACU and Endoscopy Unit  Anesthesia Type:MAC  Level of Consciousness: drowsy and patient cooperative  Airway & Oxygen Therapy: Patient Spontanous Breathing and Patient connected to face mask oxygen  Post-op Assessment: Report given to RN and Post -op Vital signs reviewed and stable  Post vital signs: Reviewed and stable  Last Vitals:  Vitals Value Taken Time  BP    Temp    Pulse 75 09/20/19 1217  Resp 16 09/20/19 1217  SpO2 100 % 09/20/19 1217  Vitals shown include unvalidated device data.  Last Pain:  Vitals:   09/20/19 1018  TempSrc: Oral  PainSc: 3       Patients Stated Pain Goal: 3 (97/35/32 9924)  Complications: No complications documented.

## 2019-09-20 NOTE — H&P (Signed)
Referring Provider: No ref. 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  HPI: Alexander Reeves. is a 54 y.o. male in clinical remission after being diagnosis with moderately differentiated adenocarcinoma ascending colon, stage IIIb (pT3pN1) last year, status post a right colectomy 11/19/2018 and subsequent chemotherapy. He returns today for his one year surveillance colonoscopy. Surveillance CT scan are scheduled for later this month.    No known family history of colon cancer or polyps. No family history of uterine/endometrial cancer, pancreatic cancer or gastric/stomach cancer.   Past Medical History:  Diagnosis Date  . Arthritis    KNEES  . Asthma   . Deaf   . GERD (gastroesophageal reflux disease)    OCC  . Scrotal cyst   . Thyroid disease   . Urinary frequency    OCC    Past Surgical History:  Procedure Laterality Date  . BIOPSY  11/19/2018   Procedure: BIOPSY;  Surgeon: Thornton Park, MD;  Location: [place];  Service: Gastroenterology;;  . BLADDER SURGERY  YRS AGO  . COLONOSCOPY WITH PROPOFOL N/A 11/19/2018   Procedure: COLONOSCOPY WITH PROPOFOL;  Surgeon: Thornton Park, MD;  Location: [place];  Service: Gastroenterology;  Laterality: N/A;  . EPIDIDYMECTOMY Right 03/22/2018   Procedure: SCROTAL EXPLORATION RIGHT  PARTIAL EPIDIDYMECTOMY;  Surgeon: [name], MD;  Location[facility];  Service: Urology;  Laterality: Right;  . HEMORRHOID SURGERY  WHEN YOUNG  . LAPAROTOMY N/A 11/19/2018   Procedure: EXPLORATORY LAPAROTOMY;  Surgeon: Rolm Bookbinder, MD;  Location: [place];  Service: General;  Laterality: N/A;  . NO PAST SURGERIES    . PARTIAL COLECTOMY Right  11/19/2018   Procedure: PARTIAL COLECTOMY;  Surgeon: Rolm Bookbinder, MD;  Location: [place];  Service: General;  Laterality: Right;  . PORTACATH PLACEMENT Right 12/23/2018   Procedure: INSERTION PORT-A-CATH WITH ULTRASOUND GUIDANCE;  Surgeon: Rolm Bookbinder, MD;  Location: [place];  Service: General;  Laterality: Right;  . SUBMUCOSAL TATTOO INJECTION  11/19/2018   Procedure: SUBMUCOSAL TATTOO INJECTION;  Surgeon: Thornton Park, MD;  Location: [place];  Service: Gastroenterology;;  . [name]    Current Facility-Administered Medications  Medication Dose Route Frequency Provider Last Rate Last Admin  . 0.9 %  sodium chloride infusion   Intravenous Continuous Thornton Park, MD        Allergies as of 08/26/2019 - Review Complete 08/25/2019  Allergen Reaction Noted  . Cheese Shortness Of Breath and Swelling 09/01/2016  . Eggs or egg-derived products Shortness Of Breath and Swelling 09/01/2016  . Penicillins Anaphylaxis 12/07/2015    Family History  Problem Relation Age of Onset  . Heart disease Father   . Colon cancer Neg Hx   . Esophageal cancer Neg Hx   . Rectal cancer Neg Hx   . Stomach cancer Neg Hx     Social History   Socioeconomic History  . Marital status: Single    Spouse name: Not on file  . Number of children: Not on file  . Years of education: Not on file  . Highest education level: Not on file  Occupational History  . Not on file  Tobacco Use  . Smoking status: Former Smoker    Types: Cigarettes  Quit date: 06/24/2016    Years since quitting: 3.2  . Smokeless tobacco: Never Used  Vaping Use  . Vaping Use: Never used  Substance and Sexual Activity  . Alcohol use: Yes    Comment: OCC  . Drug use: Yes    Types: Marijuana    Comment: weekends (last fri and sat)  . Sexual activity: Never    Birth control/protection: None  Other Topics Concern  . Not on file  Social History Narrative   [facility]  (09/01/16):   No new pets. Moved from [place]. Questionable mold exposure at his previous residence and possibly some at his new apartment as well.    Social Determinants of Health   Financial Resource Strain:   . Difficulty of Paying Living Expenses: Not on file  Food Insecurity:   . Worried About Charity fundraiser in the Last Year: Not on file  . Ran Out of Food in the Last Year: Not on file  Transportation Needs:   . Lack of Transportation (Medical): Not on file  . Lack of Transportation (Non-Medical): Not on file  Physical Activity:   . Days of Exercise per Week: Not on file  . Minutes of Exercise per Session: Not on file  Stress:   . Feeling of Stress : Not on file  Social Connections:   . Frequency of Communication with Friends and Family: Not on file  . Frequency of Social Gatherings with Friends and Family: Not on file  . Attends Religious Services: Not on file  . Active Member of Clubs or Organizations: Not on file  . Attends Archivist Meetings: Not on file  . Marital Status: Not on file  Intimate Partner Violence:   . Fear of Current or Ex-Partner: Not on file  . Emotionally Abused: Not on file  . Physically Abused: Not on file  . Sexually Abused: Not on file     Physical Exam: General:   Alert,  well-nourished, pleasant and cooperative in NAD Head:  Normocephalic and atraumatic. Eyes:  Sclera clear, no icterus.   Conjunctiva pink. Ears:  Normal auditory acuity. Nose:  No deformity, discharge,  or lesions. Mouth:  No deformity or lesions.   Neck:  Supple; no masses or thyromegaly. Lungs:  Clear throughout to auscultation.   No wheezes. Heart:  Regular rate and rhythm; no murmurs. Abdomen:  Soft, well healed surgical scars, nontender, nondistended, normal bowel sounds, no rebound or guarding. No hepatosplenomegaly.   Rectal:  Deferred  Msk:  Symmetrical. No boney deformities LAD: No inguinal or umbilical LAD Extremities:  No clubbing or  edema. Neurologic:  Alert and  oriented x4;  grossly nonfocal Skin:  Intact without significant lesions or rashes. Psych:  Alert and cooperative. Normal mood and affect.    Zaylyn Bergdoll L. Tarri Glenn, MD, MPH 09/20/2019, 10:32 AM

## 2019-09-20 NOTE — Op Note (Signed)
[facility] Patient Name: Alexander Reeves Procedure Date: 09/20/2019 MRN: [id number] Attending MD: Thornton Park MD, MD Date of Birth: 1965/12/08 CSN: [id number] Age: 54 Admit Type: Outpatient Procedure:                Colonoscopy Indications:              High risk colon cancer surveillance: Personal                            history of colon cancer                           stage IIIb Adenocarcinoma of the colon s/p                            colectomy and chemotherapy Providers:                Thornton Park MD, MD, Clyde Lundborg, RN, Tyrone Apple, Technician, Christell Faith, CRNA Referring MD:              Medicines:                Monitored Anesthesia Care Complications:            No immediate complications. Estimated blood loss:                            Minimal. Estimated Blood Loss:     Estimated blood loss was minimal. Procedure:                Pre-Anesthesia Assessment:                           - Prior to the procedure, a History and Physical                            was performed, and patient medications and                            allergies were reviewed. The patient's tolerance of                            previous anesthesia was also reviewed. The risks                            and benefits of the procedure and the sedation                            options and risks were discussed with the patient.                            All questions were answered, and informed consent                            was obtained. Prior Anticoagulants: The patient  has                            taken no previous anticoagulant or antiplatelet                            agents. ASA Grade Assessment: III - A patient with                            severe systemic disease. After reviewing the risks                            and benefits, the patient was deemed in                            satisfactory condition to undergo the  procedure.                           After obtaining informed consent, the colonoscope                            was passed under direct vision. Throughout the                            procedure, the patient's blood pressure, pulse, and                            oxygen saturations were monitored continuously. The                            CF-HQ190L ([id number]) Olympus colonoscope was                            introduced through the anus and advanced to the the                            terminal ileum. The colonoscopy was performed                            without difficulty. The patient tolerated the                            procedure well. The quality of the bowel                            preparation was good. The terminal ileum, ileocecal                            valve, appendiceal orifice, and rectum were                            photographed. Scope In: 11:58:15 AM Scope Out: 29:92:42 PM Scope Withdrawal Time: 0 hours 8 minutes 0 seconds  Total Procedure Duration: 0 hours 10 minutes 1 second  Findings:  The perianal and digital rectal examinations were normal.      Multiple small and large-mouthed diverticula were found in the sigmoid       colon and descending colon.      A 1-2 mm polyp was found in the ascending colon. The polyp was sessile.       The polyp was removed with a cold snare. Resection and retrieval were       complete. Estimated blood loss was minimal.      There was evidence of a prior end-to-end ileo-colonic anastomosis in the       ascending colon. This was characterized by ulceration in one portion of       the anastomosis. The anastomosis was traversed. Biopsies were taken with       a cold forceps for histology. Estimated blood loss was minimal.      Anal papilla(e) were hypertrophied.      The exam was otherwise without abnormality on direct and retroflexion       views. Impression:               - Diverticulosis in the sigmoid colon and in  the                            descending colon.                           - One 1-2 mm polyp in the ascending colon, removed                            with a cold snare. Resected and retrieved.                           - End-to-end ileo-colonic anastomosis,                            characterized by ulceration. Biopsied.                           - Anal papilla(e) were hypertrophied.                           - The examination was otherwise normal on direct                            and retroflexion views. Moderate Sedation:      Not Applicable - Patient had care per Anesthesia. Recommendation:           - Patient has a contact number available for                            emergencies. The signs and symptoms of potential                            delayed complications were discussed with the                            patient. Return to normal activities tomorrow.  Written discharge instructions were provided to the                            patient.                           - Resume previous diet.                           - Continue present medications.                           - Await pathology results                           - Repeat colonoscopy in 3 years for surveillance.                           - Consider referral to Dr. Nadeen Landau as the                            sensation that he feels at his rectum may be due to                            the hypertrophied anal papillae.                           - All first degree family members (brothers,                            sisters, parents, children) should start colon                            cancer screening at age 55. Procedure Code(s):        --- Professional ---                           [phone], Colonoscopy, flexible; with removal of                            tumor(s), polyp(s), or other lesion(s) by snare                            technique                           45380,  39, Colonoscopy, flexible; with biopsy,                            single or multiple Diagnosis Code(s):        --- Professional ---                           D66.440, Personal history of other malignant  neoplasm of large intestine                           K63.5, Polyp of colon                           Z98.0, Intestinal bypass and anastomosis status                           K62.89, Other specified diseases of anus and rectum                           K57.30, Diverticulosis of large intestine without                            perforation or abscess without bleeding CPT copyright 2019 [name] Medical Association. All rights reserved. The codes documented in this report are preliminary and upon coder review may  be revised to meet current compliance requirements. Thornton Park MD, MD 09/20/2019 12:27:17 PM This report has been signed electronically. Number of Addenda: 0

## 2019-09-21 ENCOUNTER — Encounter: Payer: Self-pay | Admitting: Gastroenterology

## 2019-09-21 LAB — SURGICAL PATHOLOGY

## 2019-09-22 ENCOUNTER — Encounter (HOSPITAL_COMMUNITY): Payer: Self-pay | Admitting: Gastroenterology

## 2019-09-22 ENCOUNTER — Encounter: Payer: Self-pay | Admitting: *Deleted

## 2019-09-22 NOTE — Telephone Encounter (Signed)
Opened t/e in error.

## 2019-09-26 ENCOUNTER — Other Ambulatory Visit: Payer: Self-pay

## 2019-09-26 ENCOUNTER — Ambulatory Visit (INDEPENDENT_AMBULATORY_CARE_PROVIDER_SITE_OTHER): Payer: Medicare Other | Admitting: Primary Care

## 2019-09-26 ENCOUNTER — Encounter (INDEPENDENT_AMBULATORY_CARE_PROVIDER_SITE_OTHER): Payer: Self-pay | Admitting: Primary Care

## 2019-09-26 VITALS — BP 107/70 | HR 86 | Temp 98.3°F | Ht 70.0 in | Wt 147.2 lb

## 2019-09-26 DIAGNOSIS — H913 Deaf nonspeaking, not elsewhere classified: Secondary | ICD-10-CM | POA: Diagnosis not present

## 2019-09-26 DIAGNOSIS — R300 Dysuria: Secondary | ICD-10-CM

## 2019-09-26 DIAGNOSIS — Z760 Encounter for issue of repeat prescription: Secondary | ICD-10-CM | POA: Diagnosis not present

## 2019-09-26 LAB — POCT URINALYSIS DIP (CLINITEK)
Bilirubin, UA: NEGATIVE
Blood, UA: NEGATIVE
Glucose, UA: NEGATIVE mg/dL
Ketones, POC UA: NEGATIVE mg/dL
Leukocytes, UA: NEGATIVE
Nitrite, UA: NEGATIVE
POC PROTEIN,UA: NEGATIVE
Spec Grav, UA: 1.025 (ref 1.010–1.025)
Urobilinogen, UA: 0.2 E.U./dL
pH, UA: 6.5 (ref 5.0–8.0)

## 2019-09-26 MED ORDER — BREO ELLIPTA 200-25 MCG/INH IN AEPB: 1.0000 | INHALATION_SPRAY | Freq: Every day | RESPIRATORY_TRACT | 11 refills | Status: AC

## 2019-09-26 MED ORDER — METHOCARBAMOL 500 MG PO TABS: 500.0000 mg | ORAL_TABLET | Freq: Three times a day (TID) | ORAL | 0 refills | Status: DC | PRN

## 2019-09-26 NOTE — Progress Notes (Signed)
Established Patient Office Visit  Subjective:  Patient ID: Alexander Reeves., male    DOB: 1965-04-23  Age: 54 y.o. MRN: [id number]  CC:  Chief Complaint  Patient presents with  . Back Pain    believes its his kidney    HPI [name] Sr. Is a 54 year old deaf male who presents for left flank pain with CVA tenderness. Dysuria and color is fine. Drinks a gallon of water and water with electrolytes.   Past Medical History:  Diagnosis Date  . Arthritis    KNEES  . Asthma   . Deaf   . GERD (gastroesophageal reflux disease)    OCC  . Scrotal cyst   . Thyroid disease   . Urinary frequency    OCC    Past Surgical History:  Procedure Laterality Date  . BIOPSY  11/19/2018   Procedure: BIOPSY;  Surgeon: Thornton Park, MD;  Location: [place];  Service: Gastroenterology;;  . BIOPSY  09/20/2019   Procedure: BIOPSY;  Surgeon: Thornton Park, MD;  Location: [facility];  Service: Gastroenterology;;  . BLADDER SURGERY  YRS AGO  . COLONOSCOPY WITH PROPOFOL N/A 11/19/2018   Procedure: COLONOSCOPY WITH PROPOFOL;  Surgeon: Thornton Park, MD;  Location: [place];  Service: Gastroenterology;  Laterality: N/A;  . COLONOSCOPY WITH PROPOFOL N/A 09/20/2019   Procedure: COLONOSCOPY WITH PROPOFOL;  Surgeon: Thornton Park, MD;  Location: [facility];  Service: Gastroenterology;  Laterality: N/A;  . EPIDIDYMECTOMY Right 03/22/2018   Procedure: SCROTAL EXPLORATION RIGHT  PARTIAL EPIDIDYMECTOMY;  Surgeon: [name], MD;  Location: [facility];  Service: Urology;  Laterality: Right;  . HEMORRHOID SURGERY  WHEN YOUNG  . LAPAROTOMY N/A 11/19/2018   Procedure: EXPLORATORY LAPAROTOMY;  Surgeon: Rolm Bookbinder, MD;  Location: [place];  Service: General;  Laterality: N/A;  . NO PAST SURGERIES    . PARTIAL COLECTOMY Right 11/19/2018   Procedure: PARTIAL COLECTOMY;  Surgeon: Rolm Bookbinder, MD;  Location: [place];  Service: General;  Laterality: Right;   . POLYPECTOMY  09/20/2019   Procedure: POLYPECTOMY;  Surgeon: Thornton Park, MD;  Location: [facility];  Service: Gastroenterology;;  . [name] PLACEMENT Right 12/23/2018   Procedure: INSERTION PORT-A-CATH WITH ULTRASOUND GUIDANCE;  Surgeon: Rolm Bookbinder, MD;  Location: [place];  Service: General;  Laterality: Right;  . SUBMUCOSAL TATTOO INJECTION  11/19/2018   Procedure: SUBMUCOSAL TATTOO INJECTION;  Surgeon: Thornton Park, MD;  Location: [facility];  Service: Gastroenterology;;  . Alexander Reeves AGO    Family History  Problem Relation Age of Onset  . Heart disease Father   . Colon cancer Neg Hx   . Esophageal cancer Neg Hx   . Rectal cancer Neg Hx   . Stomach cancer Neg Hx     Social History   Socioeconomic History  . Marital status: Single    Spouse name: Not on file  . Number of children: Not on file  . Years of education: Not on file  . Highest education level: Not on file  Occupational History  . Not on file  Tobacco Use  . Smoking status: Former Smoker    Types: Cigarettes    Quit date: 06/24/2016    Years since quitting: 3.2  . Smokeless tobacco: Never Used  Vaping Use  . Vaping Use: Never used  Substance and Sexual Activity  . Alcohol use: Yes    Comment: OCC  . Drug use: Yes    Types: Marijuana  Comment: weekends (last fri and sat)  . Sexual activity: Never    Birth control/protection: None  Other Topics Concern  . Not on file  Social History Narrative   [facility] (09/01/16):   No new pets. Moved from [place]. Questionable mold exposure at his previous residence and possibly some at his new apartment as well.    Social Determinants of Health   Financial Resource Strain:   . Difficulty of Paying Living Expenses: Not on file  Food Insecurity:   . Worried About Charity fundraiser in the Last Year: Not on file  . Ran Out of Food in the Last Year: Not on file  Transportation Needs:   . Lack of Transportation  (Medical): Not on file  . Lack of Transportation (Non-Medical): Not on file  Physical Activity:   . Days of Exercise per Week: Not on file  . Minutes of Exercise per Session: Not on file  Stress:   . Feeling of Stress : Not on file  Social Connections:   . Frequency of Communication with Friends and Family: Not on file  . Frequency of Social Gatherings with Friends and Family: Not on file  . Attends Religious Services: Not on file  . Active Member of Clubs or Organizations: Not on file  . Attends Archivist Meetings: Not on file  . Marital Status: Not on file  Intimate Partner Violence:   . Fear of Current or Ex-Partner: Not on file  . Emotionally Abused: Not on file  . Physically Abused: Not on file  . Sexually Abused: Not on file    Outpatient Medications Prior to Visit  Medication Sig Dispense Refill  . albuterol (PROVENTIL) (2.5 MG/3ML) 0.083% nebulizer solution Take 3 mLs (2.5 mg total) by nebulization every 6 (six) hours as needed for wheezing or shortness of breath. 75 mL 12  . albuterol (VENTOLIN HFA) 108 (90 Base) MCG/ACT inhaler Inhale 2 puffs into the lungs every 4 (four) hours as needed for wheezing or shortness of breath. 6.7 g 3  . fenofibrate (TRICOR) 145 MG tablet Take 145 mg by mouth daily.    [name] gemfibrozil (LOPID) 600 MG tablet Take 1 tablet (600 mg total) by mouth 2 (two) times daily before a meal. 180 tablet 1  . ibuprofen (ADVIL) 600 MG tablet Take 1 tablet (600 mg total) by mouth every 8 (eight) hours as needed for moderate pain. 90 tablet 0  . lidocaine-prilocaine (EMLA) cream Apply 1 application topically as needed. To feet (Patient taking differently: Apply 1 application topically as needed (port). ) 30 g 2  . montelukast (SINGULAIR) 10 MG tablet Take 1 tablet (10 mg total) by mouth at bedtime. 90 tablet 1  . pantoprazole (PROTONIX) 40 MG tablet Take 1 tablet (40 mg total) by mouth daily. 30 tablet 3  . Phenazopyridine HCl (AZO TABS PO) Take 1 tablet  by mouth daily as needed (infection).     . Sodium Sulfate-Mag Sulfate-KCl (SUTAB) [phone] MG TABS Take 1 kit by mouth once.    . fluticasone furoate-vilanterol (BREO ELLIPTA) 200-25 MCG/INH AEPB Inhale 1 puff into the lungs daily. 60 each 11   No facility-administered medications prior to visit.    Allergies  Allergen Reactions  . Cheese Shortness Of Breath and Swelling    Swelling of the throat  . Eggs Or Egg-Derived Products Shortness Of Breath and Swelling    Swelling of the throat  . Penicillins Anaphylaxis    Has patient had a PCN reaction  causing immediate rash, facial/tongue/throat swelling, SOB or lightheadedness with hypotension: Yes Has patient had a PCN reaction causing severe rash involving mucus membranes or skin necrosis: Unknown Has patient had a PCN reaction that required hospitalization: Unknown Has patient had a PCN reaction occurring within the last 10 years: Unknown If all of the above answers are "NO", then may proceed with Cephalosporin use.     ROS Review of Systems  Genitourinary: Positive for dysuria and frequency.  All other systems reviewed and are negative.     Objective:    Physical Exam Vitals reviewed.  HENT:     Nose: Nose normal.  Eyes:     Pupils: Pupils are equal, round, and reactive to light.  Cardiovascular:     Rate and Rhythm: Normal rate and regular rhythm.  Pulmonary:     Effort: Pulmonary effort is normal.     Breath sounds: Normal breath sounds.  Abdominal:     General: Bowel sounds are normal.  Musculoskeletal:     Cervical back: Normal range of motion.  Skin:    General: Skin is warm and dry.  Neurological:     Mental Status: He is alert and oriented to person, place, and time.  Psychiatric:        Mood and Affect: Mood normal.     BP 107/70 (BP Location: Right Arm, Patient Position: Sitting, Cuff Size: Normal)   Pulse 86   Temp 98.3 F (36.8 C) (Oral)   Ht _0  (1.778 m)   Wt 147 lb 3.2 oz (66.8 kg)    SpO2 99%   BMI 21.12 kg/m  Wt Readings from Last 3 Encounters:  09/26/19 147 lb 3.2 oz (66.8 kg)  09/20/19 150 lb (68 kg)  08/25/19 147 lb (66.7 kg)     Health Maintenance Due  Topic Date Due  . COVID-19 Vaccine (1) Never done    There are no preventive care reminders to display for this patient.  Lab Results  Component Value Date   TSH 0.702 09/01/2016   Lab Results  Component Value Date   WBC 4.1 08/25/2019   HGB 14.1 08/25/2019   HCT 43.6 08/25/2019   MCV 89 08/25/2019   PLT 215 08/25/2019   Lab Results  Component Value Date   NA 143 08/25/2019   K 4.7 08/25/2019   CO2 26 08/25/2019   GLUCOSE 100 (H) 08/25/2019   BUN 17 08/25/2019   CREATININE 1.25 08/25/2019   BILITOT 0.2 08/25/2019   ALKPHOS 51 08/25/2019   AST 20 08/25/2019   ALT 13 08/25/2019   PROT 7.5 08/25/2019   ALBUMIN 4.7 08/25/2019   CALCIUM 10.0 08/25/2019   ANIONGAP 10 06/15/2019   Lab Results  Component Value Date   CHOL 189 08/25/2019   Lab Results  Component Value Date   HDL 63 08/25/2019   Lab Results  Component Value Date   LDLCALC 97 08/25/2019   Lab Results  Component Value Date   TRIG 168 (H) 08/25/2019   Lab Results  Component Value Date   CHOLHDL 3.0 08/25/2019   No results found for: HGBA1C    Assessment & Plan:  Alexander Reeves was seen today for back pain.  Diagnoses and all orders for this visit:  Nonspeaking deaf interpretor present   Dysuria  PCOT urine negative will prescribe Robaxin 500 mg Q 8hrs as needed . Make sure you are drinking at least 48 oz of water per day. Work on eating a low fat, heart healthy diet and  participate in regular aerobic exercise program to control as well. Exercise at least  30 minutes per day-5 days per week. Avoid red meat. No fried foods. No junk foods, sodas, sugary foods or drinks, unhealthy snacking, alcohol or smoking.     -     POCT URINALYSIS DIP (CLINITEK)  Medication refill -     fluticasone furoate-vilanterol (BREO  ELLIPTA) 200-25 MCG/INH AEPB; Inhale 1 puff into the lungs daily.  Other orders -     methocarbamol (ROBAXIN) 500 MG tablet; Take 1 tablet (500 mg total) by mouth every 8 (eight) hours as needed for muscle spasms.   Meds ordered this encounter  Medications  . fluticasone furoate-vilanterol (BREO ELLIPTA) 200-25 MCG/INH AEPB    Sig: Inhale 1 puff into the lungs daily.    Dispense:  60 each    Refill:  11  . methocarbamol (ROBAXIN) 500 MG tablet    Sig: Take 1 tablet (500 mg total) by mouth every 8 (eight) hours as needed for muscle spasms.    Dispense:  90 tablet    Refill:  0    Follow-up: Return if symptoms worsen or fail to improve.    [name], NP

## 2019-09-26 NOTE — Progress Notes (Signed)
Pt states pain increases with movement and lifting

## 2019-09-28 ENCOUNTER — Inpatient Hospital Stay: Payer: Medicare Other | Attending: Nurse Practitioner

## 2019-09-28 ENCOUNTER — Ambulatory Visit (HOSPITAL_COMMUNITY): Payer: Medicare Other

## 2019-09-28 ENCOUNTER — Other Ambulatory Visit: Payer: Self-pay

## 2019-09-28 ENCOUNTER — Inpatient Hospital Stay: Payer: Medicare Other

## 2019-09-28 ENCOUNTER — Telehealth: Payer: Self-pay

## 2019-09-28 DIAGNOSIS — C182 Malignant neoplasm of ascending colon: Secondary | ICD-10-CM | POA: Diagnosis not present

## 2019-09-28 DIAGNOSIS — D709 Neutropenia, unspecified: Secondary | ICD-10-CM | POA: Insufficient documentation

## 2019-09-28 DIAGNOSIS — C189 Malignant neoplasm of colon, unspecified: Secondary | ICD-10-CM

## 2019-09-28 LAB — CMP (CANCER CENTER ONLY)
ALT: 12 U/L (ref 0–44)
AST: 18 U/L (ref 15–41)
Albumin: 4 g/dL (ref 3.5–5.0)
Alkaline Phosphatase: 65 U/L (ref 38–126)
Anion gap: 8 (ref 5–15)
BUN: 21 mg/dL — ABNORMAL HIGH (ref 6–20)
CO2: 25 mmol/L (ref 22–32)
Calcium: 9.6 mg/dL (ref 8.9–10.3)
Chloride: 109 mmol/L (ref 98–111)
Creatinine: 1.45 mg/dL — ABNORMAL HIGH (ref 0.61–1.24)
GFR, Est AFR Am: >60 mL/min (ref >60)
GFR, Estimated: 55 mL/min — ABNORMAL LOW (ref >60)
Glucose, Bld: 96 mg/dL (ref 70–99)
Potassium: 4.4 mmol/L (ref 3.5–5.1)
Sodium: 142 mmol/L (ref 135–145)
Total Bilirubin: 0.3 mg/dL (ref 0.3–1.2)
Total Protein: 7.7 g/dL (ref 6.5–8.1)

## 2019-09-28 LAB — CBC WITH DIFFERENTIAL (CANCER CENTER ONLY)
Abs Immature Granulocytes: 0.01 10*3/uL (ref 0.00–0.07)
Basophils Absolute: 0 10*3/uL (ref 0.0–0.1)
Basophils Relative: 1 %
Eosinophils Absolute: 0.7 10*3/uL — ABNORMAL HIGH (ref 0.0–0.5)
Eosinophils Relative: 14 %
HCT: 39.2 % (ref 39.0–52.0)
Hemoglobin: 12.5 g/dL — ABNORMAL LOW (ref 13.0–17.0)
Immature Granulocytes: 0 %
Lymphocytes Relative: 33 %
Lymphs Abs: 1.8 10*3/uL (ref 0.7–4.0)
MCH: 27.6 pg (ref 26.0–34.0)
MCHC: 31.9 g/dL (ref 30.0–36.0)
MCV: 86.5 fL (ref 80.0–100.0)
Monocytes Absolute: 0.6 10*3/uL (ref 0.1–1.0)
Monocytes Relative: 11 %
Neutro Abs: 2.3 10*3/uL (ref 1.7–7.7)
Neutrophils Relative %: 41 %
Platelet Count: 208 10*3/uL (ref 150–400)
RBC: 4.53 MIL/uL (ref 4.22–5.81)
RDW: 13.6 % (ref 11.5–15.5)
WBC Count: 5.4 10*3/uL (ref 4.0–10.5)
nRBC: 0 % (ref 0.0–0.2)

## 2019-09-28 LAB — CEA (IN HOUSE-CHCC): CEA (CHCC-In House): 1.75 ng/mL (ref 0.00–5.00)

## 2019-09-28 MED FILL — VENTOLIN HFA 90 MCG INHALER: 108 (90 BAS | 16 days supply | Qty: 18 | Fill #3

## 2019-09-28 NOTE — Telephone Encounter (Signed)
Called relayed message concerning elevated creatinine/ Messaged received

## 2019-09-28 NOTE — Telephone Encounter (Signed)
-----   Message from Owens Shark, NP sent at 09/28/2019  4:27 PM EDT ----- He is scheduled for CT scans tomorrow.  Please notify radiology creatinine is elevated, question reduced dose of contrast.

## 2019-09-29 ENCOUNTER — Ambulatory Visit (HOSPITAL_COMMUNITY)
Admission: RE | Admit: 2019-09-29 | Discharge: 2019-09-29 | Disposition: A | Discharge status: Home / Self Care | Payer: Medicare Other | Source: Ambulatory Visit | Attending: Nurse Practitioner | Admitting: Nurse Practitioner

## 2019-09-29 DIAGNOSIS — M4186 Other forms of scoliosis, lumbar region: Secondary | ICD-10-CM | POA: Diagnosis not present

## 2019-09-29 DIAGNOSIS — C189 Malignant neoplasm of colon, unspecified: Secondary | ICD-10-CM | POA: Insufficient documentation

## 2019-09-29 DIAGNOSIS — I700 Atherosclerosis of aorta: Secondary | ICD-10-CM | POA: Diagnosis not present

## 2019-09-29 DIAGNOSIS — Z23 Encounter for immunization: Secondary | ICD-10-CM | POA: Diagnosis not present

## 2019-09-29 DIAGNOSIS — K7689 Other specified diseases of liver: Secondary | ICD-10-CM | POA: Diagnosis not present

## 2019-09-29 DIAGNOSIS — R109 Unspecified abdominal pain: Secondary | ICD-10-CM | POA: Diagnosis not present

## 2019-09-29 DIAGNOSIS — K8689 Other specified diseases of pancreas: Secondary | ICD-10-CM | POA: Diagnosis not present

## 2019-09-29 DIAGNOSIS — M5137 Other intervertebral disc degeneration, lumbosacral region: Secondary | ICD-10-CM | POA: Diagnosis not present

## 2019-09-29 DIAGNOSIS — K5730 Diverticulosis of large intestine without perforation or abscess without bleeding: Secondary | ICD-10-CM | POA: Diagnosis not present

## 2019-09-29 DIAGNOSIS — M4184 Other forms of scoliosis, thoracic region: Secondary | ICD-10-CM | POA: Diagnosis not present

## 2019-09-29 IMAGING — CT CT CHEST W/ CM
2 of 5 series · 12 of 36 positions shown, 15 images · IV contrast (APPLIED)
Comparison: Multiple exams, including CT examinations from
[DATE] and [DATE]

CLINICAL DATA: Left lower abdominal pain for 3 weeks.
Restaging/surveillance of colon cancer, prior right partial
colectomy [DATE], final cycle of adjuvant chemotherapy [DATE].

EXAM:
CT CHEST, ABDOMEN, AND PELVIS WITH CONTRAST
TECHNIQUE: Multidetector CT imaging of the chest, abdomen and pelvis was
performed following the standard protocol during bolus
administration of intravenous contrast.
CONTRAST:  100mL OMNIPAQUE IOHEXOL 300 MG/ML  SOLN

[Series 2: cap with · axial · 0.89mm/px · z∈[-529,+11]mm · 9 of 136 slices shown, 12 images]
[im 14/136  mediastinal]
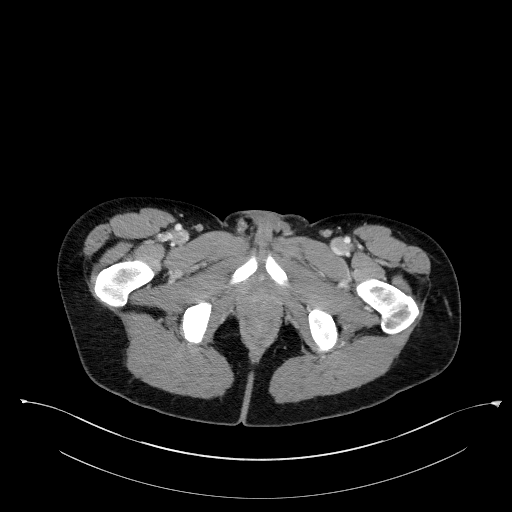
[im 14/136  lung]
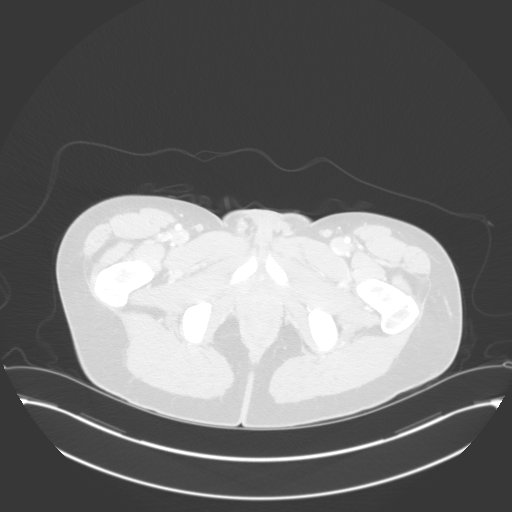
[im 28/136  lung]
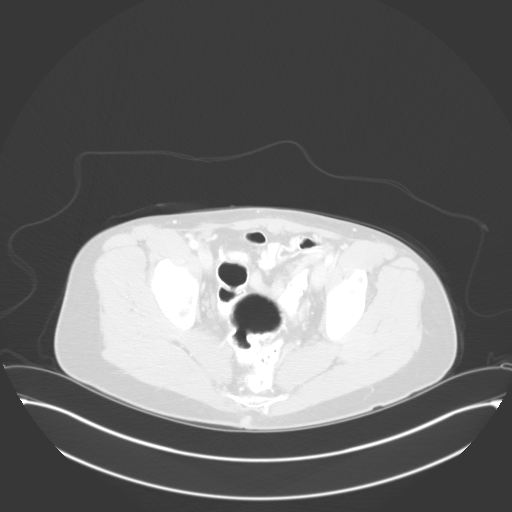
[im 41/136  lung]
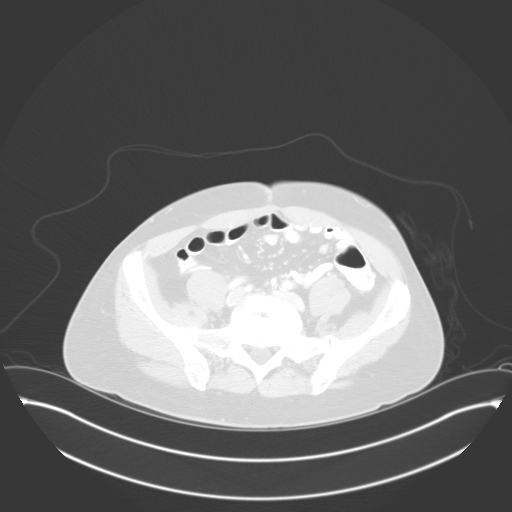
[im 55/136  lung]
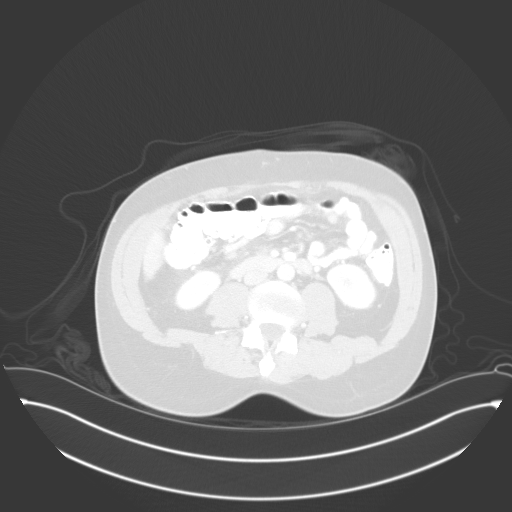
[im 68/136  mediastinal]
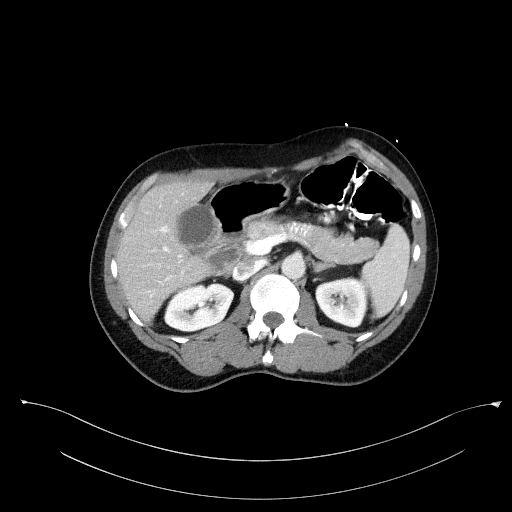
[im 68/136  lung]
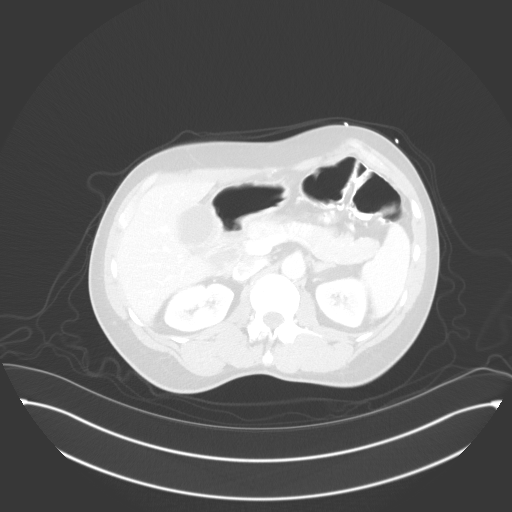
[im 82/136  lung]
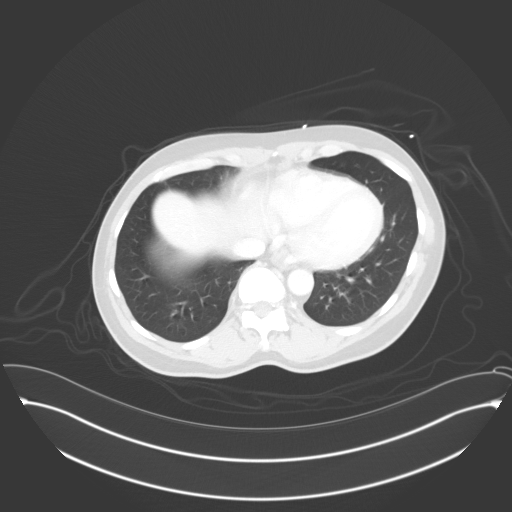
[im 95/136  lung]
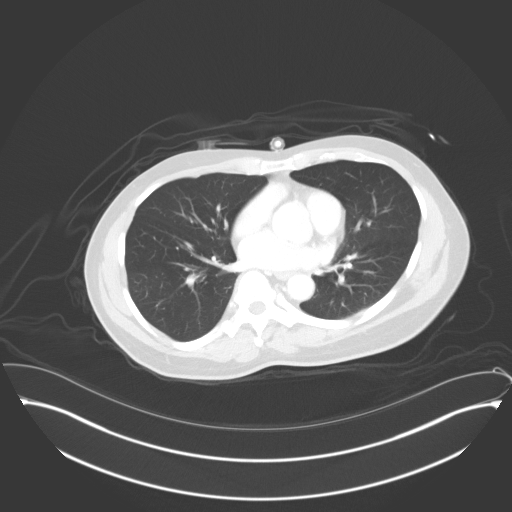
[im 109/136  lung]
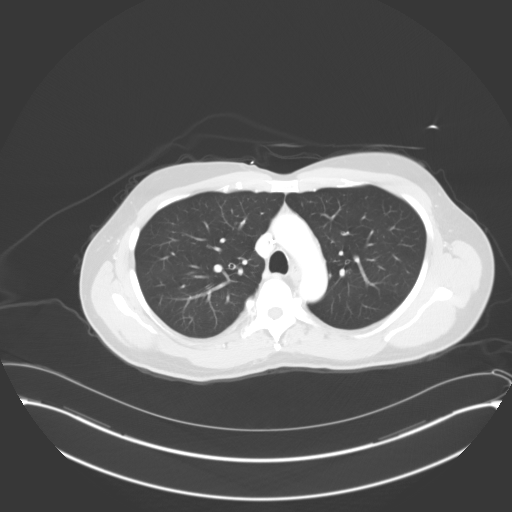
[im 122/136  mediastinal]
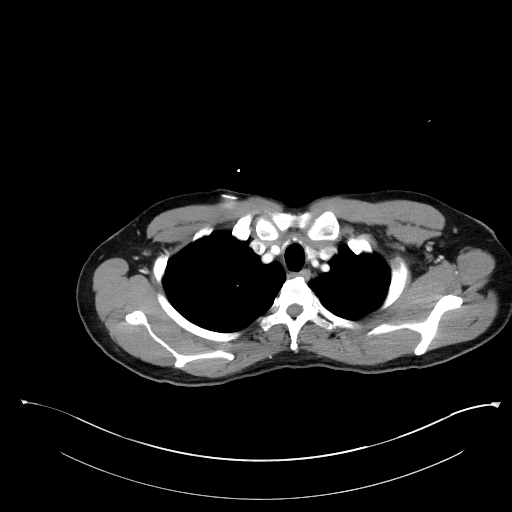
[im 122/136  lung]
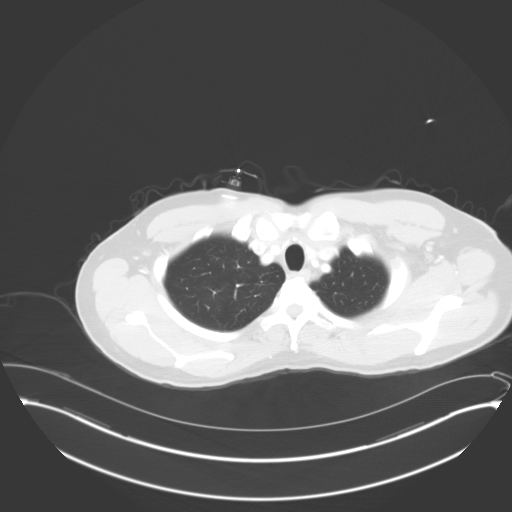

[Series 5: coronals · coronal · 0.86mm/px · 3 of 155 slices shown]
[im 31/155  lung]
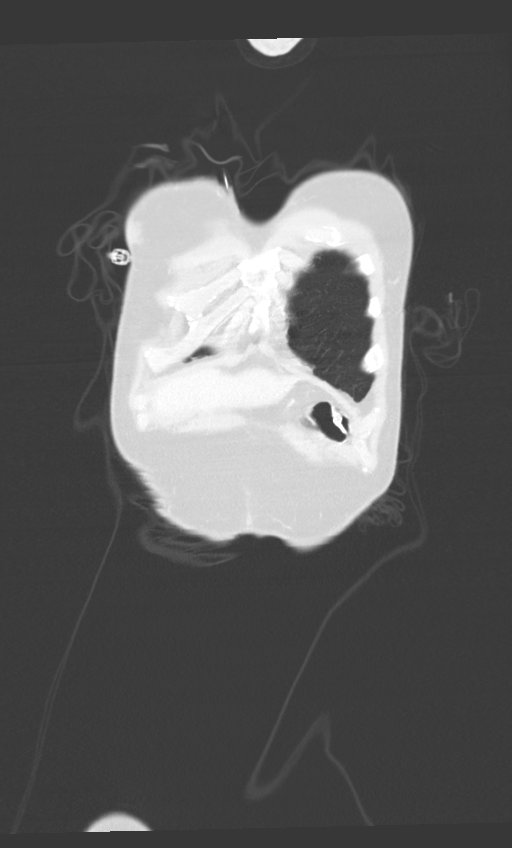
[im 62/155  lung]
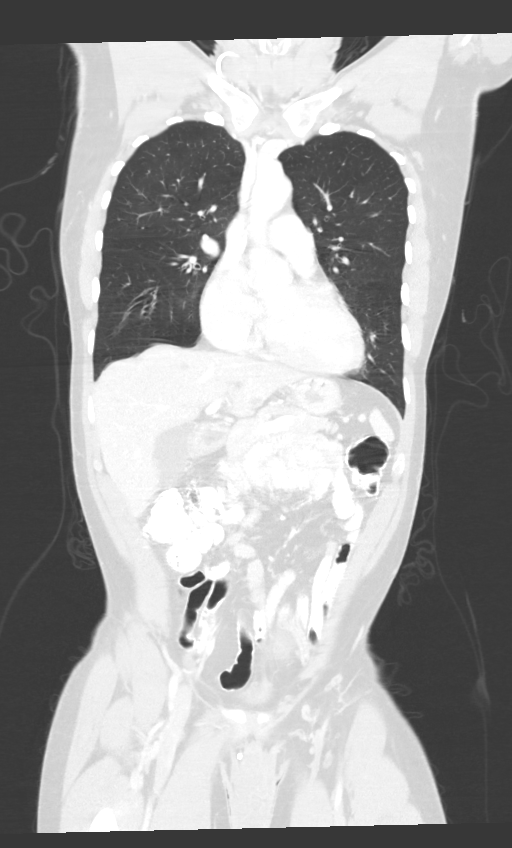
[im 93/155  lung]
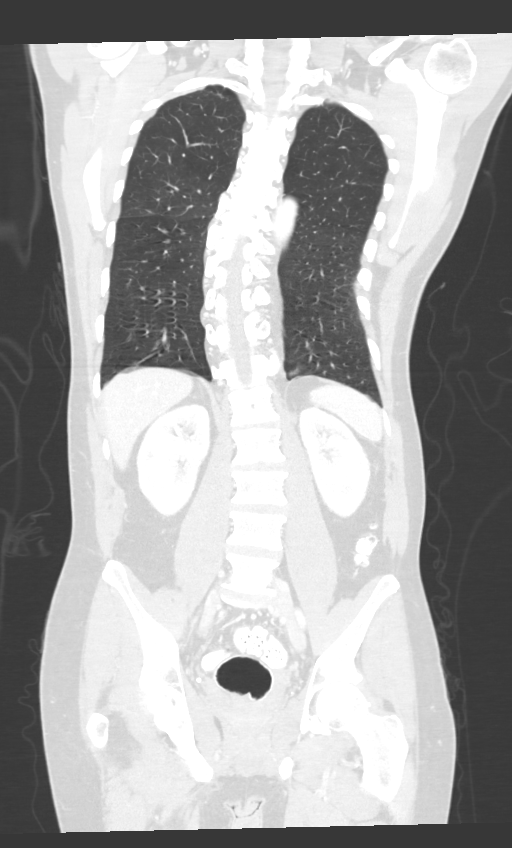

[12 of 36 positions shown; findings below may reference images not displayed]

FINDINGS: CT CHEST FINDINGS

Cardiovascular: Right Port-A-Cath tip: SVC.

Mediastinum/Nodes: Unremarkable

Lungs/Pleura: Unremarkable

Musculoskeletal: Dextroconvex thoracic scoliosis.

CT ABDOMEN PELVIS FINDINGS

Hepatobiliary: Eight new scattered hypoenhancing masses in the liver
compatible with metastatic disease. Index lesion in the base of the
caudate lobe measures 2.1 by 2.0 cm on image 62/2. Another lesion
inferiorly in the right hepatic lobe measures 1.4 by 1.3 cm on image
72/2. None of these were present on [DATE]. Gallbladder
unremarkable.

Pancreas: Unremarkable

Spleen: Unremarkable

Adrenals/Urinary Tract: Unremarkable

Stomach/Bowel: Scattered colonic diverticula. Prior right partial
colectomy.

Vascular/Lymphatic: Aortoiliac atherosclerotic vascular disease.
Peripancreatic/porta hepatis node 0.8 cm in short axis, image 66/2.
Indistinct peripancreatic nodularity below the pancreatic head
likely representing small lymph nodes measuring up to 0.6 cm in
short axis on images 77-80 of series 2. These are mildly
indistinctly marginated and mildly atypical in position, and in
light of the findings in the liver, may merit surveillance.

Reproductive: Unremarkable

Other: No supplemental non-categorized findings.

Musculoskeletal: Mild levoconvex lumbar scoliosis. Degenerative disc
disease at L5-S1.
IMPRESSION: 1. Unfortunately, there are 8 scattered hypoenhancing masses in the
liver compatible with hepatic metastatic disease.
2. Indistinctly marginated nodularity below the pancreatic head,
probably due to small lymph nodes. These are somewhat atypical in
position for peripancreatic lymph nodes and merit surveillance.
3. Prior right partial colectomy.
4. Scattered colonic diverticula.
5. Thoracolumbar scoliosis.

## 2019-09-29 MED ORDER — HEPARIN SOD (PORK) LOCK FLUSH 100 UNIT/ML IV SOLN
INTRAVENOUS | Status: AC
  Administered 2019-09-29: 500 [IU] via INTRAVENOUS
  Filled 2019-09-29: qty 5

## 2019-09-29 MED ORDER — HEPARIN SOD (PORK) LOCK FLUSH 100 UNIT/ML IV SOLN: 500.0000 [IU] | Freq: Once | INTRAVENOUS | Status: AC

## 2019-09-29 MED ORDER — IOHEXOL 300 MG/ML  SOLN
100.0000 mL | Freq: Once | INTRAMUSCULAR | Status: AC | PRN
  Administered 2019-09-29: 100 mL via INTRAVENOUS

## 2019-09-30 ENCOUNTER — Inpatient Hospital Stay: Payer: Medicare Other | Admitting: Oncology

## 2019-09-30 ENCOUNTER — Telehealth: Payer: Self-pay | Admitting: *Deleted

## 2019-09-30 NOTE — Telephone Encounter (Signed)
With assistance of interpreter, called patient to schedule OV w/ Dr. Benay Spice on 10/03/19 at 3pm to go over CT scan. He agrees to appointment. Contacted scheduler to determine if we need to arrange for an interpreter for him?

## 2019-10-03 ENCOUNTER — Inpatient Hospital Stay: Payer: Medicare Other

## 2019-10-03 ENCOUNTER — Inpatient Hospital Stay (HOSPITAL_BASED_OUTPATIENT_CLINIC_OR_DEPARTMENT_OTHER): Payer: Medicare Other | Admitting: Oncology

## 2019-10-03 ENCOUNTER — Other Ambulatory Visit: Payer: Self-pay | Admitting: Oncology

## 2019-10-03 ENCOUNTER — Other Ambulatory Visit: Payer: Self-pay

## 2019-10-03 VITALS — BP 136/90 | HR 98 | Temp 97.7°F | Resp 18 | Ht 70.0 in | Wt 147.5 lb

## 2019-10-03 DIAGNOSIS — R300 Dysuria: Secondary | ICD-10-CM

## 2019-10-03 DIAGNOSIS — C182 Malignant neoplasm of ascending colon: Secondary | ICD-10-CM | POA: Diagnosis not present

## 2019-10-03 DIAGNOSIS — C189 Malignant neoplasm of colon, unspecified: Secondary | ICD-10-CM | POA: Diagnosis not present

## 2019-10-03 DIAGNOSIS — D709 Neutropenia, unspecified: Secondary | ICD-10-CM | POA: Diagnosis not present

## 2019-10-03 LAB — URINALYSIS, COMPLETE (UACMP) WITH MICROSCOPIC
Bacteria, UA: NONE SEEN
Bilirubin Urine: NEGATIVE
Glucose, UA: NEGATIVE mg/dL
Hgb urine dipstick: NEGATIVE
Ketones, ur: NEGATIVE mg/dL
Leukocytes,Ua: NEGATIVE
Nitrite: NEGATIVE
Protein, ur: NEGATIVE mg/dL
Specific Gravity, Urine: 1.019 (ref 1.005–1.030)
pH: 6 (ref 5.0–8.0)

## 2019-10-03 MED ORDER — ALBUTEROL SULFATE (2.5 MG/3ML) 0.083% IN NEBU: 2.5000 mg | INHALATION_SOLUTION | Freq: Four times a day (QID) | RESPIRATORY_TRACT | 12 refills | Status: DC | PRN

## 2019-10-03 MED ORDER — PANTOPRAZOLE SODIUM 40 MG PO TBEC
40.0000 mg | DELAYED_RELEASE_TABLET | Freq: Every day | ORAL | 3 refills | Status: DC
Start: 2019-10-03 — End: 2019-11-16

## 2019-10-03 MED FILL — PANTOPRAZOLE SOD DR 40 MG T: 40 | 30 days supply | Qty: 30 | Fill #0

## 2019-10-03 NOTE — Progress Notes (Signed)
[facility] OFFICE PROGRESS NOTE   Diagnosis: Colon cancer  INTERVAL HISTORY:   Alexander Reeves returns for a scheduled visit.  He is here today with a sign language interpreter.  He reports pain in the left flank area for the past month.  He feels the pain is related to his "kidney ".  No other complaint.  He continues to have mild neuropathy symptoms in the extremities.  Objective:  Vital signs in last 24 hours:  Blood pressure 136/90, pulse 98, temperature 97.7 F (36.5 C), temperature source Tympanic, resp. rate 18, height '5\' 10"'  (1.778 m), weight 147 lb 8 oz (66.9 kg), SpO2 96 %.    Lymphatics: No cervical, supraclavicular, axillary, or inguinal nodes Resp: Lungs clear bilaterally Cardio: Regular rate and rhythm GI: No hepatosplenomegaly, nontender, no mass Vascular: No leg edema  Musculoskeletal: Mild tenderness at the left flank  Portacath/PICC-without erythema  Lab Results:  Lab Results  Component Value Date   WBC 5.4 09/28/2019   HGB 12.5 (L) 09/28/2019   HCT 39.2 09/28/2019   MCV 86.5 09/28/2019   PLT 208 09/28/2019   NEUTROABS 2.3 09/28/2019    CMP  Lab Results  Component Value Date   NA 142 09/28/2019   K 4.4 09/28/2019   CL 109 09/28/2019   CO2 25 09/28/2019   GLUCOSE 96 09/28/2019   BUN 21 (H) 09/28/2019   CREATININE 1.45 (H) 09/28/2019   CALCIUM 9.6 09/28/2019   PROT 7.7 09/28/2019   ALBUMIN 4.0 09/28/2019   AST 18 09/28/2019   ALT 12 09/28/2019   ALKPHOS 65 09/28/2019   BILITOT 0.3 09/28/2019   GFRNONAA 55 (L) 09/28/2019   GFRAA >60 09/28/2019    Lab Results  Component Value Date   CEA1 1.75 09/28/2019    Medications: I have reviewed the patient's current medications.   Assessment/Plan: 1. Moderately differentiated adenocarcinoma ascending colon, stage IIIb (pT3pN1), status post a right colectomy 11/19/2018 ? Lymphovascular and perineural invasion present, 2/14 lymph nodes positive, tumor deposits present ? Positive  radial margin, no loss of mismatch repair protein expression, discussed case with Dr. Craig Staggers mass with surrounding inflammation at multiple margins, no gross residual disease, unclear which is the "positive "radial margin, further surgery and radiation not recommended ? Colonoscopy 11/19/2018-completely obstructing mid ascending colon mass, could not be passed with endoscope, biopsy confirmed invasive adenocarcinoma ? CT abdomen/pelvis 11/18/2018-wall thickening at the mid and distal ascending colon with mild distention of the proximal ascending colon and cecum ? CTs 10/17/2018--no acute findings, no chest lymphadenopathy, lungs clear ? Cycle 1 FOLFOX 12/28/2018 ? Cycle 2 FOLFOX 01/11/2019, Udenyca added ? Cycle 3 FOLFOX 01/25/2019,Udenyca held due to bone pain ? Cycle 4 FOLFOX 02/21/2019, Udenyca added ? Cycle 5 FOLFOX 03/09/2019, Udenyca ? Cycle 6 FOLFOX 03/21/2019, Udenyca ? Cycle 7 FOLFOX 04/04/2019, Udenyca ? Cycle 8 FOLFOX 04/18/2019, Udenyca ? Cycle 9 FOLFOX 05/02/2019, Udenyca ? Cycle 10 FOLFOX 05/16/2019, oxaliplatin, 5-FU bolus, and Udenyca held ? Cycle 11 FOLFOX 05/30/2019, oxaliplatin, 5-FU bolus, and Udenyca held ? Cycle 12 FOLFOX 06/15/2019, oxaliplatin, 5-FU bolus and Udenyca held ? CTs 09/29/2019-new hypodense enhancing liver masses consistent with metastatic disease, indistinct marginated nodularity below the pancreas head-likely small lymph nodes  2. Deaf 3. Right epididymal cyst removal 03/22/2018 4. Asthma 5. Port-A-Cath placement, [name], 12/23/2018 6. Neutropenia secondary to chemotherapy-Udenyca added for cycle 2 FOLFOX 7. 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  He completed adjuvant FOLFOX chemotherapy in June.  The restaging CTs last week revealed new hypodense liver lesions suspicious for  metastases.  I discussed the CT findings and reviewed the images with Alexander Reeves.  He understands no therapy will be curative if the liver lesions proved to be metastases.  He will be referred for an ultrasound-guided biopsy of a liver lesion.  He will return for an office visit and further discussion after the biopsy.  He will bring his wife to the next office visit.  We will submit the liver biopsy tissue for next generation sequencing.  Alexander Reeves will return for an office visit on 10/12/2019.  He will submit a urine sample for urinalysis and culture today.  Betsy Coder, MD  10/03/2019  3:39 PM

## 2019-10-04 ENCOUNTER — Telehealth: Payer: Self-pay | Admitting: Nurse Practitioner

## 2019-10-04 LAB — URINE CULTURE: Culture: NO GROWTH

## 2019-10-04 NOTE — Telephone Encounter (Signed)
Scheduled appointment per 9/20 los. Called patient, no answer. Left message for patient with appointment date and time.

## 2019-10-06 ENCOUNTER — Encounter (HOSPITAL_COMMUNITY): Payer: Self-pay | Admitting: Radiology

## 2019-10-06 NOTE — Progress Notes (Signed)
Marti B. Otis Brace. Male, 54 y.o., 1965/11/17 MRN:  [id number] Phone:  [phone] (W) Needs Interpreter: Sign Language PCP:  [name], NP Primary Cvg:  Medicare/Medicare Part A And B Next Appt With Radiology ([facility] 2) 10/14/2019 at 1:00 PM  FW: Biopsy Received: Today Graves, Andre Lefort D     Previous Messages   ----- Message -----  From: Markus Daft, MD  Sent: 10/04/2019  2:45 PM EDT  To: Lenore Cordia  Subject: RE: Biopsy                    Ok for US guided liver lesion biopsy.   [place]  ----- Message -----  From: Lenore Cordia  Sent: 10/04/2019  2:39 PM EDT  To: [facility] Procedure Requests  Subject: Biopsy                      Procedure Requested:US Biopsy   Reason for Procedure: h/o colon cancer,new liver lesions      Provider Requesting: Dr Benay Spice  Provider Telephone: [phone]    Other Info: Patient has portacath for iv access  Please obtain core biopsies for routine pathology and molecular testing

## 2019-10-11 NOTE — Telephone Encounter (Signed)
Pt wanting to speak with scheduler about his upcoming appointment in order to reserve transportation for dates needed. States he would like to cancel tomorrows appt and would like to keep his apt for oct 5th

## 2019-10-12 ENCOUNTER — Other Ambulatory Visit: Payer: Self-pay | Admitting: Primary Care

## 2019-10-12 ENCOUNTER — Other Ambulatory Visit (INDEPENDENT_AMBULATORY_CARE_PROVIDER_SITE_OTHER): Payer: Self-pay | Admitting: Primary Care

## 2019-10-12 ENCOUNTER — Inpatient Hospital Stay: Payer: Medicare Other | Admitting: Nurse Practitioner

## 2019-10-12 ENCOUNTER — Telehealth: Payer: Self-pay | Admitting: *Deleted

## 2019-10-12 MED FILL — MONTELUKAST SOD 10 MG TAB: 10 | 90 days supply | Qty: 90 | Fill #0

## 2019-10-12 NOTE — Telephone Encounter (Signed)
Called patient w/appointment w/Lisa Marcello Moores, NP on 10/21/19 to go over biopsy results and make plans. He was confused about biopsy time-thought 10/5 was consult only and biopsy later. Informed him he is to arrive at [facility] short stay on 10/5 at 1115 for 1 pm biopsy procedure. He is to be NPO after 7 am and needs a driver and someone to stay overnight with him.

## 2019-10-12 NOTE — Telephone Encounter (Signed)
Sent to PCP to refill if appropriate.  

## 2019-10-14 ENCOUNTER — Ambulatory Visit (HOSPITAL_COMMUNITY): Payer: Medicare Other

## 2019-10-14 ENCOUNTER — Telehealth (INDEPENDENT_AMBULATORY_CARE_PROVIDER_SITE_OTHER): Payer: Self-pay

## 2019-10-14 NOTE — Telephone Encounter (Signed)
Copied from [place] [phone]. Topic: General - Other >> Oct 13, 2019  1:39 PM Keene Breath wrote: Reason for CRM: Patient would like the nurse or doctor to call patient regarding whether he can take calcium supplement and which one would be best for him since he is a cancer patient.  Please advise and call to discuss at [phone]    Please advise or should patient contact oncology. Nat Christen, CMA

## 2019-10-17 ENCOUNTER — Other Ambulatory Visit: Payer: Self-pay | Admitting: Student

## 2019-10-17 ENCOUNTER — Other Ambulatory Visit: Payer: Self-pay | Admitting: Physician Assistant

## 2019-10-17 MED FILL — VENTOLIN HFA 90 MCG INHALER: 108 (90 BAS | 16 days supply | Qty: 18 | Fill #0

## 2019-10-18 ENCOUNTER — Ambulatory Visit (HOSPITAL_COMMUNITY)
Admission: RE | Admit: 2019-10-18 | Discharge: 2019-10-18 | Disposition: A | Discharge status: Home / Self Care | Payer: Medicare Other | Source: Ambulatory Visit | Attending: Oncology | Admitting: Oncology

## 2019-10-18 ENCOUNTER — Other Ambulatory Visit: Payer: Self-pay

## 2019-10-18 DIAGNOSIS — C227 Other specified carcinomas of liver: Secondary | ICD-10-CM | POA: Insufficient documentation

## 2019-10-18 DIAGNOSIS — Z9221 Personal history of antineoplastic chemotherapy: Secondary | ICD-10-CM | POA: Insufficient documentation

## 2019-10-18 DIAGNOSIS — Z79899 Other long term (current) drug therapy: Secondary | ICD-10-CM | POA: Diagnosis not present

## 2019-10-18 DIAGNOSIS — C189 Malignant neoplasm of colon, unspecified: Secondary | ICD-10-CM

## 2019-10-18 DIAGNOSIS — K769 Liver disease, unspecified: Secondary | ICD-10-CM | POA: Diagnosis present

## 2019-10-18 DIAGNOSIS — K219 Gastro-esophageal reflux disease without esophagitis: Secondary | ICD-10-CM | POA: Diagnosis not present

## 2019-10-18 DIAGNOSIS — Z87891 Personal history of nicotine dependence: Secondary | ICD-10-CM | POA: Insufficient documentation

## 2019-10-18 DIAGNOSIS — M1990 Unspecified osteoarthritis, unspecified site: Secondary | ICD-10-CM | POA: Diagnosis not present

## 2019-10-18 DIAGNOSIS — C229 Malignant neoplasm of liver, not specified as primary or secondary: Secondary | ICD-10-CM | POA: Diagnosis not present

## 2019-10-18 DIAGNOSIS — K7689 Other specified diseases of liver: Secondary | ICD-10-CM | POA: Diagnosis not present

## 2019-10-18 LAB — CBC
HCT: 40.1 % (ref 39.0–52.0)
Hemoglobin: 12.3 g/dL — ABNORMAL LOW (ref 13.0–17.0)
MCH: 26.3 pg (ref 26.0–34.0)
MCHC: 30.7 g/dL (ref 30.0–36.0)
MCV: 85.7 fL (ref 80.0–100.0)
Platelets: 236 10*3/uL (ref 150–400)
RBC: 4.68 MIL/uL (ref 4.22–5.81)
RDW: 14 % (ref 11.5–15.5)
WBC: 4.8 10*3/uL (ref 4.0–10.5)
nRBC: 0 % (ref 0.0–0.2)

## 2019-10-18 LAB — PROTIME-INR
INR: 1 (ref 0.8–1.2)
Prothrombin Time: 12.8 seconds (ref 11.4–15.2)

## 2019-10-18 LAB — APTT: aPTT: 32 seconds (ref 24–36)

## 2019-10-18 IMAGING — US US BIOPSY CORE LIVER
1 series · 13 of 16 positions shown · non-contrast
Comparison: CT the chest, abdomen and pelvis-[DATE]

INDICATION: History of colon cancer, now with multiple liver lesions worrisome
for metastatic disease. Please perform ultrasound-guided liver
lesion biopsy for tissue diagnostic purposes.

EXAM:
ULTRASOUND GUIDED LIVER LESION BIOPSY

[Series 1: us biopsy (liver) · 13 of 16 slices shown]
[im 1/16]
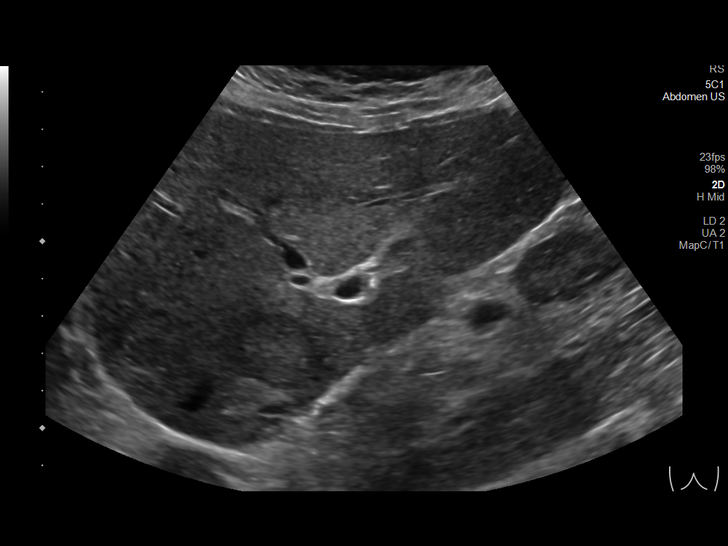
[im 2/16]
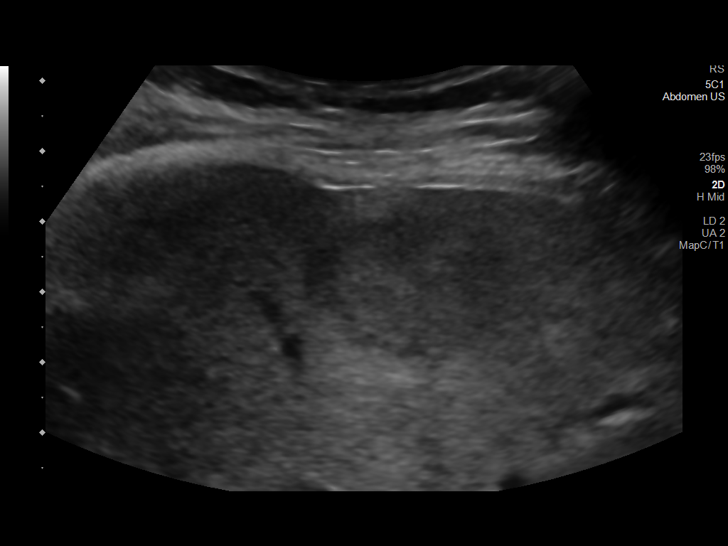
[im 4/16]
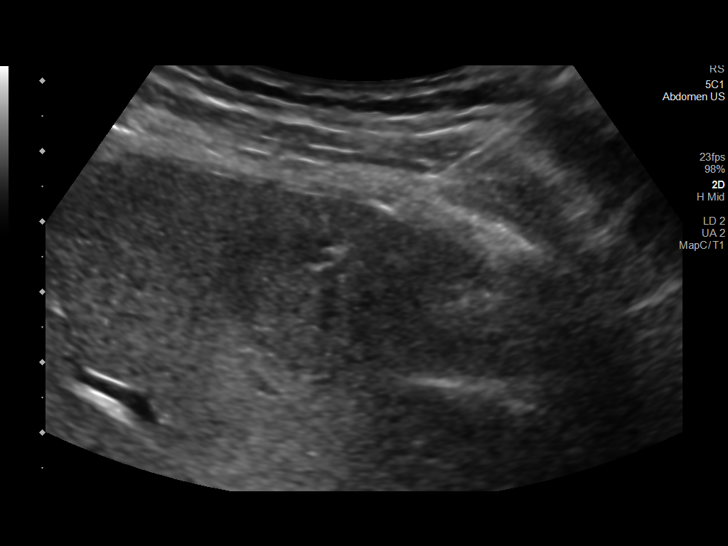
[im 5/16]
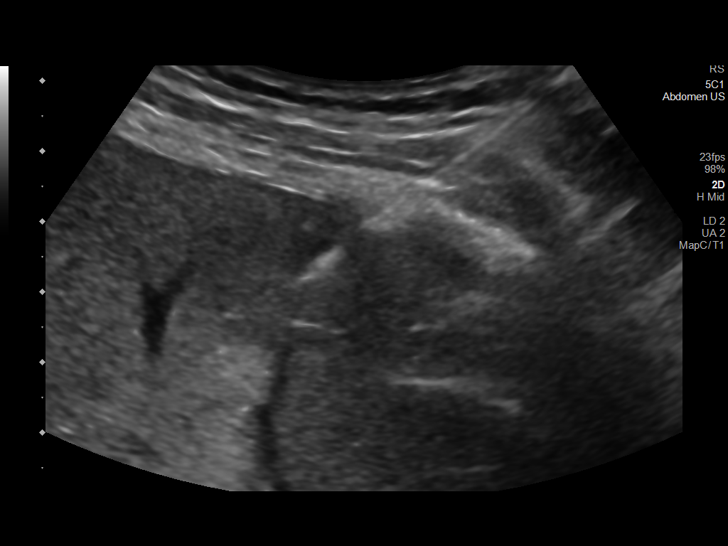
[im 6/16]
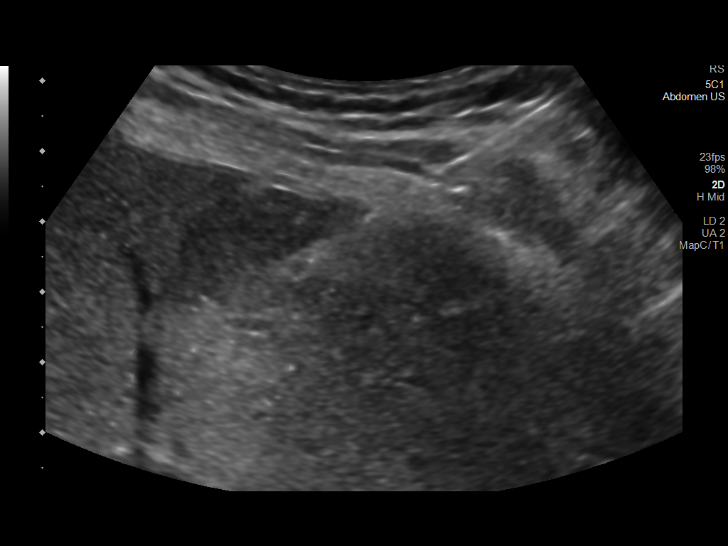
[im 7/16]
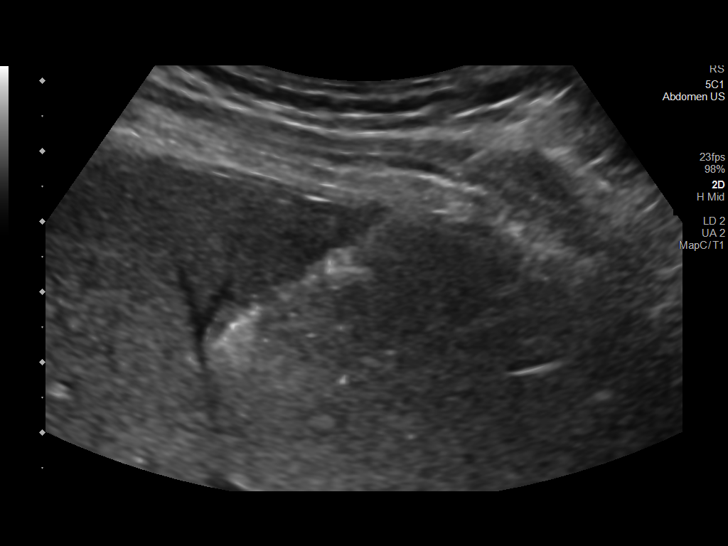
[im 9/16]
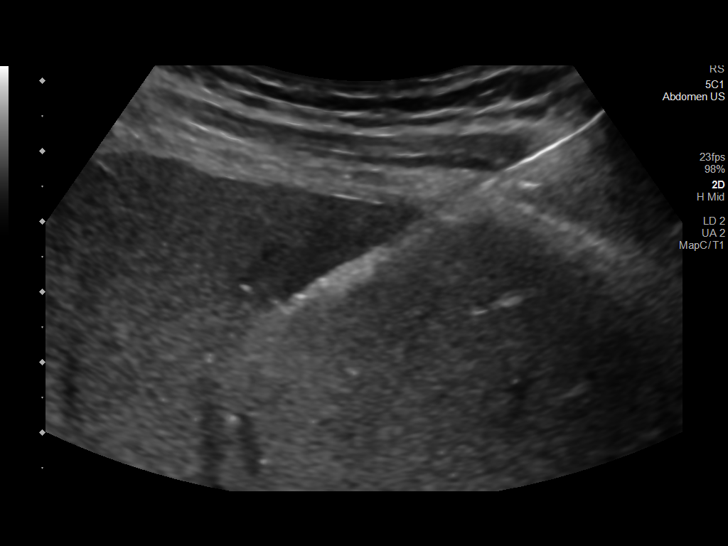
[im 10/16]
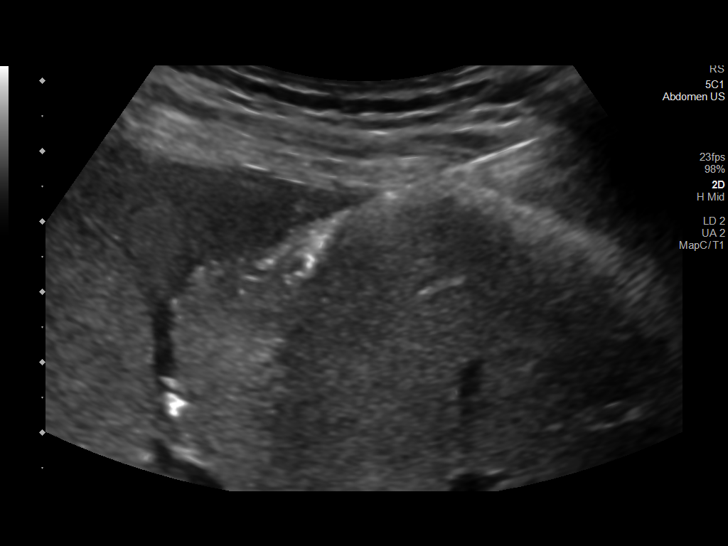
[im 11/16]
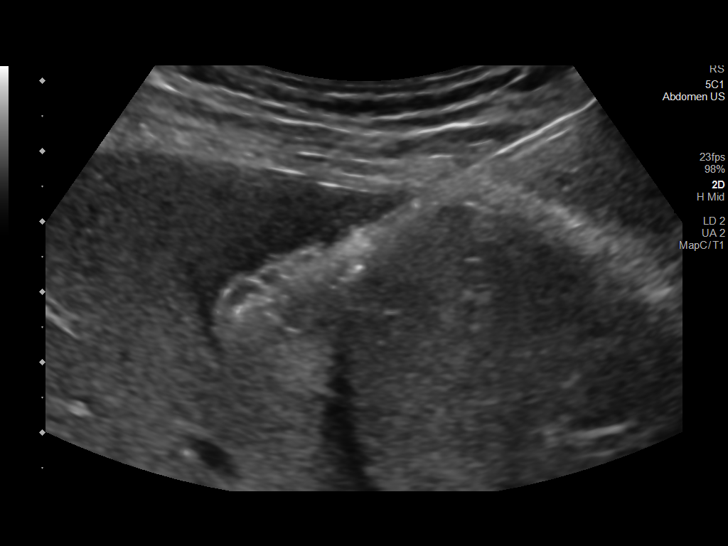
[im 12/16]
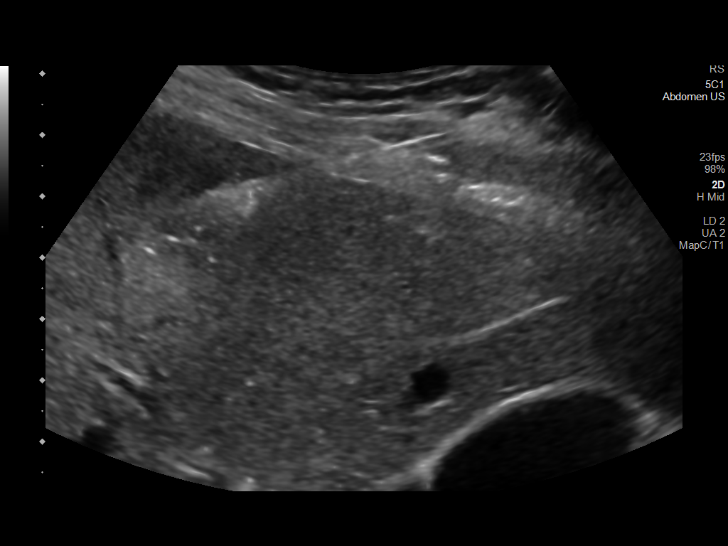
[im 13/16]
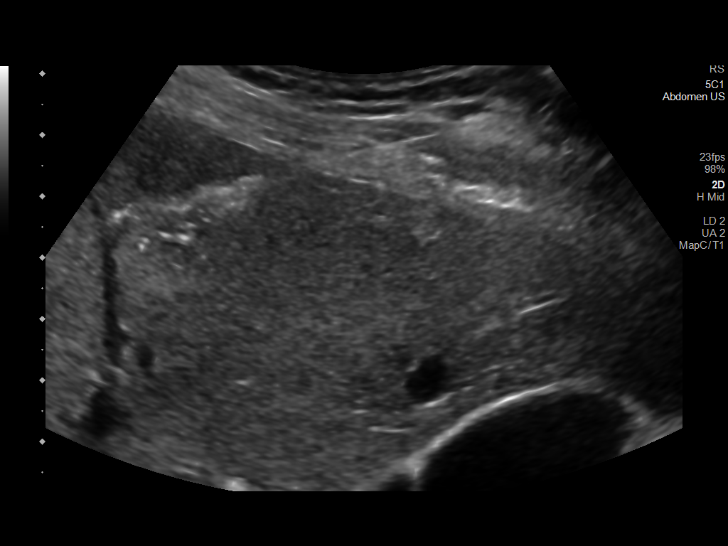
[im 15/16]
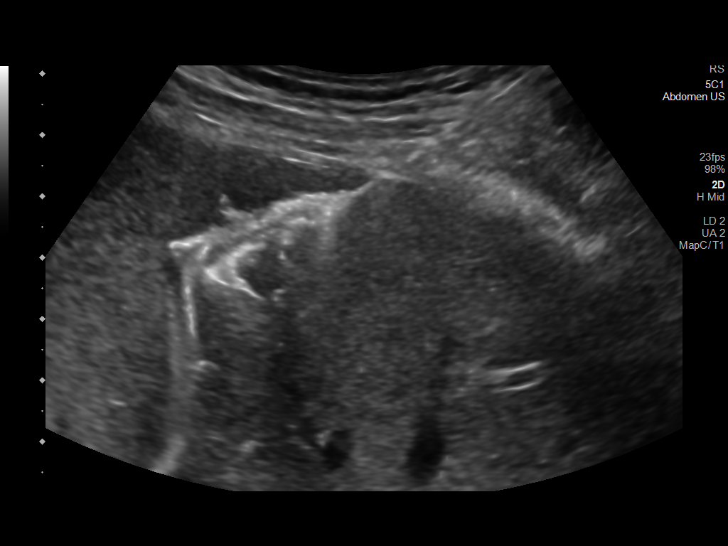
[im 16/16]
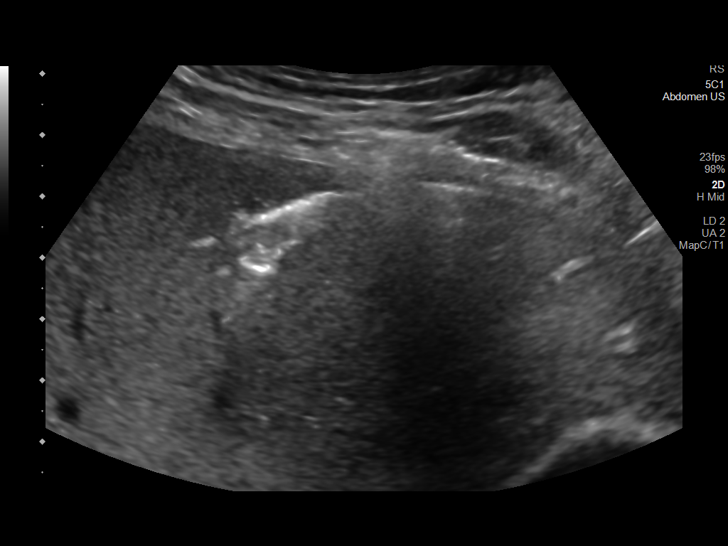

[13 of 16 positions shown; findings below may reference images not displayed]

MEDICATIONS:
None

ANESTHESIA/SEDATION:
Fentanyl 100 mcg IV; Versed 2 mg IV

Total Moderate Sedation time:  15 Minutes.

The patient's level of consciousness and vital signs were monitored
continuously by radiology nursing throughout the procedure under my
direct supervision.

COMPLICATIONS:
None immediate.

PROCEDURE:
Informed written consent was obtained from the patient (via the use
of a medical sign language translator) after a discussion of the
risks, benefits and alternatives to treatment. The patient
understands and consents the procedure. A timeout was performed
prior to the initiation of the procedure.

Ultrasound scanning was performed of the right upper abdominal
quadrant demonstrates several hypoechoic lesions and masses
scattered throughout the liver. An approximately 1.8 x 1.7 cm
hypoechoic nodule within the subcapsular aspect of the right lobe of
the liver correlating with the lesion seen on preceding abdominal CT
image 62, series 2 was targeted for biopsy given location and
sonographic window. The procedure was planned.

The right upper abdominal quadrant was prepped and draped in the
usual sterile fashion. The overlying soft tissues were anesthetized
with 1% lidocaine with epinephrine. A 17 gauge, 6.8 cm co-axial
needle was advanced into a peripheral aspect of the lesion. This was
followed by 6 core biopsies with an 18 gauge core device under
direct ultrasound guidance.

The coaxial needle tract was embolized with a small amount of
Gel-Foam slurry and superficial hemostasis was obtained with manual
compression. Post procedural scanning was negative for definitive
area of hemorrhage or additional complication. A dressing was
placed. The patient tolerated the procedure well without immediate
post procedural complication.
IMPRESSION: Technically successful ultrasound guided core needle biopsy of
indeterminate lesion within the subcapsular aspect of the right lobe
of the liver.

## 2019-10-18 MED ORDER — FENTANYL CITRATE (PF) 100 MCG/2ML IJ SOLN
INTRAMUSCULAR | Status: AC
  Filled 2019-10-18: qty 4

## 2019-10-18 MED ORDER — SODIUM CHLORIDE 0.9 % IV SOLN: INTRAVENOUS | Status: DC

## 2019-10-18 MED ORDER — FENTANYL CITRATE (PF) 100 MCG/2ML IJ SOLN
INTRAMUSCULAR | Status: AC | PRN
  Administered 2019-10-18 (×2): 50 ug via INTRAVENOUS

## 2019-10-18 MED ORDER — GELATIN ABSORBABLE 12-7 MM EX MISC
CUTANEOUS | Status: AC
  Filled 2019-10-18: qty 1

## 2019-10-18 MED ORDER — MIDAZOLAM HCL 2 MG/2ML IJ SOLN
INTRAMUSCULAR | Status: AC | PRN
  Administered 2019-10-18 (×2): 1 mg via INTRAVENOUS

## 2019-10-18 MED ORDER — LIDOCAINE-EPINEPHRINE 1 %-1:100000 IJ SOLN
INTRAMUSCULAR | Status: AC
  Filled 2019-10-18: qty 1

## 2019-10-18 MED ORDER — MIDAZOLAM HCL 2 MG/2ML IJ SOLN
INTRAMUSCULAR | Status: AC
  Filled 2019-10-18: qty 4

## 2019-10-18 NOTE — Procedures (Signed)
Pre Procedure Dx: Liver lesion  Post Procedural Dx: Same  Technically successful US guided biopsy of indeterminate lesion within the right lobe of the liver.   EBL: None  No immediate complications.   Jay Avett Reineck, MD Pager #: [phone]    

## 2019-10-18 NOTE — Discharge Instructions (Signed)

## 2019-10-18 NOTE — Consult Note (Signed)
Chief Complaint: Patient was seen in consultation today for image guided liver lesion biopsy   Referring Physician(s): Sherrill,Gary B  Supervising Physician: Sandi Mariscal  Patient Status: [facility] - Out-pt  History of Present Illness: Alexander Reeves. is a 54 y.o. male with history asthma, deafness, GERD, and locally advanced colon cancer diagnosed in November 2020, status post right colectomy with prior chemotherapy.  Recent follow-up imaging has revealed liver lesions concerning for metastatic disease and some indistinctly marginated nodularity below the pancreatic head probably due to small lymph nodes.  He presents today for image guided liver lesion biopsy for further evaluation/molecular testing.  Past Medical History:  Diagnosis Date  . Arthritis    KNEES  . Asthma   . Deaf   . GERD (gastroesophageal reflux disease)    OCC  . Scrotal cyst   . Thyroid disease   . Urinary frequency    OCC    Past Surgical History:  Procedure Laterality Date  . BIOPSY  11/19/2018   Procedure: BIOPSY;  Surgeon: Thornton Park, MD;  Location: [place];  Service: Gastroenterology;;  . BIOPSY  09/20/2019   Procedure: BIOPSY;  Surgeon: Thornton Park, MD;  Location: [facility];  Service: Gastroenterology;;  . BLADDER SURGERY  YRS AGO  . COLONOSCOPY WITH PROPOFOL N/A 11/19/2018   Procedure: COLONOSCOPY WITH PROPOFOL;  Surgeon: Thornton Park, MD;  Location: [place];  Service: Gastroenterology;  Laterality: N/A;  . COLONOSCOPY WITH PROPOFOL N/A 09/20/2019   Procedure: COLONOSCOPY WITH PROPOFOL;  Surgeon: Thornton Park, MD;  Location: [facility];  Service: Gastroenterology;  Laterality: N/A;  . EPIDIDYMECTOMY Right 03/22/2018   Procedure: SCROTAL EXPLORATION RIGHT  PARTIAL EPIDIDYMECTOMY;  Surgeon: [name], MD;  Location: [facility];  Service: Urology;  Laterality: Right;  . HEMORRHOID SURGERY  WHEN YOUNG  . LAPAROTOMY N/A 11/19/2018    Procedure: EXPLORATORY LAPAROTOMY;  Surgeon: Rolm Bookbinder, MD;  Location: [place];  Service: General;  Laterality: N/A;  . NO PAST SURGERIES    . PARTIAL COLECTOMY Right 11/19/2018   Procedure: PARTIAL COLECTOMY;  Surgeon: Rolm Bookbinder, MD;  Location: [place];  Service: General;  Laterality: Right;  . POLYPECTOMY  09/20/2019   Procedure: POLYPECTOMY;  Surgeon: Thornton Park, MD;  Location: [facility];  Service: Gastroenterology;;  . [name] PLACEMENT Right 12/23/2018   Procedure: INSERTION PORT-A-CATH WITH ULTRASOUND GUIDANCE;  Surgeon: Rolm Bookbinder, MD;  Location: [place];  Service: General;  Laterality: Right;  . SUBMUCOSAL TATTOO INJECTION  11/19/2018   Procedure: SUBMUCOSAL TATTOO INJECTION;  Surgeon: Thornton Park, MD;  Location: [place];  Service: Gastroenterology;;  . [name]    Allergies: Cheese, Eggs or egg-derived products, and Penicillins  Medications: Prior to Admission medications   Medication Sig Start Date End Date Taking? Authorizing Provider  albuterol (PROVENTIL) (2.5 MG/3ML) 0.083% nebulizer solution Take 3 mLs (2.5 mg total) by nebulization every 6 (six) hours as needed for wheezing or shortness of breath. 10/03/19 10/02/20 Yes Ladell Pier, MD  albuterol (VENTOLIN HFA) 108 (90 Base) MCG/ACT inhaler Inhale 2 puffs into the lungs every 4 (four) hours as needed for wheezing or shortness of breath. 07/06/19  Yes Ladell Pier, MD  APPLE CIDER VINEGAR PO Take 1 capsule by mouth every other day.   Yes [provider]  fenofibrate (TRICOR) 145 MG tablet TAKE 1 TABLET (145 MG TOTAL) BY MOUTH DAILY. 10/12/19  Yes [name], NP  fluticasone furoate-vilanterol (BREO ELLIPTA) 200-25 MCG/INH  AEPB Inhale 1 puff into the lungs daily. Patient taking differently: Inhale 1 puff into the lungs daily as needed (shortness of breath).  09/26/19  Yes [name], NP  ibuprofen (ADVIL) 600 MG tablet Take 1  tablet (600 mg total) by mouth every 8 (eight) hours as needed for moderate pain. 08/25/19  Yes [name], NP  lidocaine-prilocaine (EMLA) cream Apply 1 application topically as needed. To feet Patient taking differently: Apply 1 application topically daily as needed (foot pain).  09/12/19  Yes McDonald, Stephan Minister, DPM  loratadine (CLARITIN) 10 MG tablet Take 10 mg by mouth daily as needed for allergies.   Yes [provider]  methocarbamol (ROBAXIN) 500 MG tablet Take 1 tablet (500 mg total) by mouth every 8 (eight) hours as needed for muscle spasms. 09/26/19  Yes [name], NP  montelukast (SINGULAIR) 10 MG tablet Take 1 tablet (10 mg total) by mouth at bedtime. 08/25/19  Yes [name], NP  Naphazoline HCl (CLEAR EYES OP) Place 1 drop into both eyes daily as needed (redness/dryness).   Yes [provider]  pantoprazole (PROTONIX) 40 MG tablet Take 1 tablet (40 mg total) by mouth daily. 10/03/19  Yes Ladell Pier, MD  Phenazopyridine HCl (AZO TABS PO) Take 1 tablet by mouth daily as needed (urninary pain).    Yes [provider]     Family History  Problem Relation Age of Onset  . Heart disease Father   . Colon cancer Neg Hx   . Esophageal cancer Neg Hx   . Rectal cancer Neg Hx   . Stomach cancer Neg Hx     Social History   Socioeconomic History  . Marital status: Single    Spouse name: Not on file  . Number of children: Not on file  . Years of education: Not on file  . Highest education level: Not on file  Occupational History  . Not on file  Tobacco Use  . Smoking status: Former Smoker    Types: Cigarettes    Quit date: 06/24/2016    Years since quitting: 3.3  . Smokeless tobacco: Never Used  Vaping Use  . Vaping Use: Never used  Substance and Sexual Activity  . Alcohol use: Yes    Comment: OCC  . Drug use: Yes    Types: Marijuana    Comment: weekends (last fri and sat)  . Sexual activity: Never    Birth  control/protection: None  Other Topics Concern  . Not on file  Social History Narrative   [facility] (09/01/16):   No new pets. Moved from [place]. Questionable mold exposure at his previous residence and possibly some at his new apartment as well.    Social Determinants of Health   Financial Resource Strain:   . Difficulty of Paying Living Expenses: Not on file  Food Insecurity:   . Worried About Charity fundraiser in the Last Year: Not on file  . Ran Out of Food in the Last Year: Not on file  Transportation Needs:   . Lack of Transportation (Medical): Not on file  . Lack of Transportation (Non-Medical): Not on file  Physical Activity:   . Days of Exercise per Week: Not on file  . Minutes of Exercise per Session: Not on file  Stress:   . Feeling of Stress : Not on file  Social Connections:   . Frequency of Communication with Friends and Family: Not on file  . Frequency of Social  Gatherings with Friends and Family: Not on file  . Attends Religious Services: Not on file  . Active Member of Clubs or Organizations: Not on file  . Attends Archivist Meetings: Not on file  . Marital Status: Not on file      Review of Systems currently denies fever, headache, chest pain, worsening dyspnea, cough, nausea, vomiting or bleeding.  He does have some occasional neck discomfort, abdominal/back discomfort, extremity paresthesias.  Vital Signs: BP 123/89   Pulse 82   Temp 97.6 F (36.4 C) (Oral)   Resp 17   Ht 5\' 10"  (1.778 m)   Wt 147 lb (66.7 kg)   SpO2 99%   BMI 21.09 kg/m   Physical Exam awake, alert.  Chest clear to auscultation bilaterally.  Clean, intact right chest wall Port-A-Cath.  Heart with regular rate and rhythm.  Abdomen soft, positive bowel sounds, mild generalized tenderness to palpation.  No lower extremity edema.  Imaging: CT Chest W Contrast  Result Date: 09/29/2019 CLINICAL DATA:  Left lower abdominal pain for 3 weeks. Restaging/surveillance of  colon cancer, prior right partial colectomy 11/19/2018, final cycle of adjuvant chemotherapy June 15, 2019. EXAM: CT CHEST, ABDOMEN, AND PELVIS WITH CONTRAST TECHNIQUE: Multidetector CT imaging of the chest, abdomen and pelvis was performed following the standard protocol during bolus administration of intravenous contrast. CONTRAST:  168mL OMNIPAQUE IOHEXOL 300 MG/ML  SOLN COMPARISON:  Multiple exams, including CT examinations from 10/17/2018 and 11/30/2018 FINDINGS: CT CHEST FINDINGS Cardiovascular: Right Port-A-Cath tip: SVC. Mediastinum/Nodes: Unremarkable Lungs/Pleura: Unremarkable Musculoskeletal: Dextroconvex thoracic scoliosis. CT ABDOMEN PELVIS FINDINGS Hepatobiliary: Eight new scattered hypoenhancing masses in the liver compatible with metastatic disease. Index lesion in the base of the caudate lobe measures 2.1 by 2.0 cm on image 62/2. Another lesion inferiorly in the right hepatic lobe measures 1.4 by 1.3 cm on image 72/2. None of these were present on 11/30/2018. Gallbladder unremarkable. Pancreas: Unremarkable Spleen: Unremarkable Adrenals/Urinary Tract: Unremarkable Stomach/Bowel: Scattered colonic diverticula. Prior right partial colectomy. Vascular/Lymphatic: Aortoiliac atherosclerotic vascular disease. Peripancreatic/porta hepatis node 0.8 cm in short axis, image 66/2. Indistinct peripancreatic nodularity below the pancreatic head likely representing small lymph nodes measuring up to 0.6 cm in short axis on images 77-80 of series 2. These are mildly indistinctly marginated and mildly atypical in position, and in light of the findings in the liver, may merit surveillance. Reproductive: Unremarkable Other: No supplemental non-categorized findings. Musculoskeletal: Mild levoconvex lumbar scoliosis. Degenerative disc disease at L5-S1. IMPRESSION: 1. Unfortunately, there are 8 scattered hypoenhancing masses in the liver compatible with hepatic metastatic disease. 2. Indistinctly marginated nodularity  below the pancreatic head, probably due to small lymph nodes. These are somewhat atypical in position for peripancreatic lymph nodes and merit surveillance. 3. Prior right partial colectomy. 4. Scattered colonic diverticula. 5. Thoracolumbar scoliosis. Electronically Signed   By: Van Clines M.D.   On: 09/29/2019 17:09   CT Abdomen Pelvis W Contrast  Result Date: 09/29/2019 CLINICAL DATA:  Left lower abdominal pain for 3 weeks. Restaging/surveillance of colon cancer, prior right partial colectomy 11/19/2018, final cycle of adjuvant chemotherapy June 15, 2019. EXAM: CT CHEST, ABDOMEN, AND PELVIS WITH CONTRAST TECHNIQUE: Multidetector CT imaging of the chest, abdomen and pelvis was performed following the standard protocol during bolus administration of intravenous contrast. CONTRAST:  173mL OMNIPAQUE IOHEXOL 300 MG/ML  SOLN COMPARISON:  Multiple exams, including CT examinations from 10/17/2018 and 11/30/2018 FINDINGS: CT CHEST FINDINGS Cardiovascular: Right Port-A-Cath tip: SVC. Mediastinum/Nodes: Unremarkable Lungs/Pleura: Unremarkable Musculoskeletal: Dextroconvex thoracic scoliosis. CT ABDOMEN  PELVIS FINDINGS Hepatobiliary: Eight new scattered hypoenhancing masses in the liver compatible with metastatic disease. Index lesion in the base of the caudate lobe measures 2.1 by 2.0 cm on image 62/2. Another lesion inferiorly in the right hepatic lobe measures 1.4 by 1.3 cm on image 72/2. None of these were present on 11/30/2018. Gallbladder unremarkable. Pancreas: Unremarkable Spleen: Unremarkable Adrenals/Urinary Tract: Unremarkable Stomach/Bowel: Scattered colonic diverticula. Prior right partial colectomy. Vascular/Lymphatic: Aortoiliac atherosclerotic vascular disease. Peripancreatic/porta hepatis node 0.8 cm in short axis, image 66/2. Indistinct peripancreatic nodularity below the pancreatic head likely representing small lymph nodes measuring up to 0.6 cm in short axis on images 77-80 of series 2. These  are mildly indistinctly marginated and mildly atypical in position, and in light of the findings in the liver, may merit surveillance. Reproductive: Unremarkable Other: No supplemental non-categorized findings. Musculoskeletal: Mild levoconvex lumbar scoliosis. Degenerative disc disease at L5-S1. IMPRESSION: 1. Unfortunately, there are 8 scattered hypoenhancing masses in the liver compatible with hepatic metastatic disease. 2. Indistinctly marginated nodularity below the pancreatic head, probably due to small lymph nodes. These are somewhat atypical in position for peripancreatic lymph nodes and merit surveillance. 3. Prior right partial colectomy. 4. Scattered colonic diverticula. 5. Thoracolumbar scoliosis. Electronically Signed   By: Van Clines M.D.   On: 09/29/2019 17:09    Labs:  CBC: Recent Labs    07/06/19 0831 08/25/19 1105 09/28/19 0955 10/18/19 1208  WBC 3.2* 4.1 5.4 4.8  HGB 11.8* 14.1 12.5* 12.3*  HCT 37.5* 43.6 39.2 40.1  PLT 166 215 208 236    COAGS: Recent Labs    10/18/19 1208  INR 1.0  APTT 32    BMP: Recent Labs    05/30/19 1025 06/15/19 1045 08/25/19 1105 09/28/19 0955  NA 143 140 143 142  K 4.4 4.3 4.7 4.4  CL 111 108 107* 109  CO2 23 22 26 25   GLUCOSE 90 101* 100* 96  BUN 10 11 17  21*  CALCIUM 9.2 9.3 10.0 9.6  CREATININE 1.24 1.29* 1.25 1.45*  GFRNONAA >60 >60 66 55*  GFRAA >60 >60 76 >60    LIVER FUNCTION TESTS: Recent Labs    05/30/19 1025 06/15/19 1045 08/25/19 1105 09/28/19 0955  BILITOT 0.3 0.3 0.2 0.3  AST 18 17 20 18   ALT 14 10 13 12   ALKPHOS 60 65 51 65  PROT 7.0 7.2 7.5 7.7  ALBUMIN 4.0 4.2 4.7 4.0    TUMOR MARKERS: No results for input(s): AFPTM, CEA, CA199, CHROMGRNA in the last 8760 hours.  Assessment and Plan: 54 y.o. male with history asthma, deafness, GERD, and locally advanced colon cancer diagnosed in November 2020, status post right colectomy with prior chemotherapy.  Recent follow-up imaging has revealed  liver lesions concerning for metastatic disease and some indistinctly marginated nodularity below the pancreatic head probably due to small lymph nodes.  He presents today for image guided liver lesion biopsy for further evaluation/molecular testing.Risks and benefits of procedure was discussed with the patient/spouse via sign language interpreter including, but not limited to bleeding, infection, damage to adjacent structures or low yield requiring additional tests.  All of the questions were answered and there is agreement to proceed.  Consent signed and in chart.      Thank you for this interesting consult.  I greatly enjoyed meeting BENN TARVER Sr. and look forward to participating in their care.  A copy of this report was sent to the requesting provider on this date.  Electronically Signed: D. Lennette Bihari  Sammantha Mehlhaff, PA-C 10/18/2019, 1:01 PM   I spent a total of  25 minutes   in face to face in clinical consultation, greater than 50% of which was counseling/coordinating care for image guided liver lesion biopsy

## 2019-10-19 LAB — SURGICAL PATHOLOGY

## 2019-10-20 DIAGNOSIS — Z23 Encounter for immunization: Secondary | ICD-10-CM | POA: Diagnosis not present

## 2019-10-21 ENCOUNTER — Encounter: Payer: Self-pay | Admitting: Nurse Practitioner

## 2019-10-21 ENCOUNTER — Inpatient Hospital Stay: Payer: Medicare Other | Attending: Nurse Practitioner | Admitting: Nurse Practitioner

## 2019-10-21 ENCOUNTER — Telehealth: Payer: Self-pay

## 2019-10-21 ENCOUNTER — Other Ambulatory Visit: Payer: Self-pay

## 2019-10-21 VITALS — BP 143/87 | HR 74 | Temp 97.2°F | Resp 16 | Ht 70.0 in | Wt 142.7 lb

## 2019-10-21 DIAGNOSIS — Z5112 Encounter for antineoplastic immunotherapy: Secondary | ICD-10-CM | POA: Insufficient documentation

## 2019-10-21 DIAGNOSIS — C787 Secondary malignant neoplasm of liver and intrahepatic bile duct: Secondary | ICD-10-CM | POA: Diagnosis not present

## 2019-10-21 DIAGNOSIS — Z79899 Other long term (current) drug therapy: Secondary | ICD-10-CM | POA: Diagnosis not present

## 2019-10-21 DIAGNOSIS — C779 Secondary and unspecified malignant neoplasm of lymph node, unspecified: Secondary | ICD-10-CM | POA: Diagnosis not present

## 2019-10-21 DIAGNOSIS — Z7189 Other specified counseling: Secondary | ICD-10-CM | POA: Insufficient documentation

## 2019-10-21 DIAGNOSIS — Z5111 Encounter for antineoplastic chemotherapy: Secondary | ICD-10-CM | POA: Insufficient documentation

## 2019-10-21 DIAGNOSIS — C189 Malignant neoplasm of colon, unspecified: Secondary | ICD-10-CM | POA: Diagnosis not present

## 2019-10-21 DIAGNOSIS — C182 Malignant neoplasm of ascending colon: Secondary | ICD-10-CM | POA: Diagnosis not present

## 2019-10-21 NOTE — Progress Notes (Addendum)
[place] OFFICE PROGRESS NOTE   Diagnosis: Colon cancer  INTERVAL HISTORY:   Alexander Reeves returns as scheduled.  He has a good appetite.  Periodic low back pain.  Continued tingling in the toes and feet.  Objective:  Vital signs in last 24 hours:  Blood pressure (!) 143/87, pulse 74, temperature (!) 97.2 F (36.2 C), temperature source Tympanic, resp. rate 16, height '5\' 10"'  (1.778 m), weight 142 lb 11.2 oz (64.7 kg), SpO2 100 %.    Vascular: No leg edema. Neuro: Alert and oriented. Skin: Palms without erythema. Port-A-Cath without erythema.  Lab Results:  Lab Results  Component Value Date   WBC 4.8 10/18/2019   HGB 12.3 (L) 10/18/2019   HCT 40.1 10/18/2019   MCV 85.7 10/18/2019   PLT 236 10/18/2019   NEUTROABS 2.3 09/28/2019    Imaging:  No results found.  Medications: I have reviewed the patient's current medications.  Assessment/Plan: 1. Moderately differentiated adenocarcinoma ascending colon, stage IIIb (pT3pN1), status post a right colectomy 11/19/2018 ? Lymphovascular and perineural invasion present, 2/14 lymph nodes positive, tumor deposits present ? Positive radial margin, no loss of mismatch repair protein expression, discussed case with Dr. Craig Staggers mass with surrounding inflammation at multiple margins, no gross residual disease, unclear which is the "positive "radial margin, further surgery and radiation not recommended ? Colonoscopy 11/19/2018-completely obstructing mid ascending colon mass, could not be passed with endoscope, biopsy confirmed invasive adenocarcinoma ? CT abdomen/pelvis 11/18/2018-wall thickening at the mid and distal ascending colon with mild distention of the proximal ascending colon and cecum ? CTs 10/17/2018--no acute findings, no chest lymphadenopathy, lungs clear ? Cycle 1 FOLFOX 12/28/2018 ? Cycle 2 FOLFOX 01/11/2019, Udenyca added ? Cycle 3 FOLFOX 01/25/2019,Udenyca held due to bone pain ? Cycle 4 FOLFOX  02/21/2019, Udenyca added ? Cycle 5 FOLFOX 03/09/2019, Udenyca ? Cycle 6 FOLFOX 03/21/2019, Udenyca ? Cycle 7 FOLFOX 04/04/2019, Udenyca ? Cycle 8 FOLFOX 04/18/2019, Udenyca ? Cycle 9 FOLFOX 05/02/2019, Udenyca ? Cycle 10 FOLFOX 05/16/2019, oxaliplatin, 5-FU bolus, and Udenyca held ? Cycle 11 FOLFOX 05/30/2019, oxaliplatin, 5-FU bolus, and Udenyca held ? Cycle 12 FOLFOX 06/15/2019, oxaliplatin, 5-FU bolus and Udenyca held ? CTs 09/29/2019-new hypodense enhancing liver masses consistent with metastatic disease, indistinct marginated nodularity below the pancreas head-likely small lymph nodes ? Biopsy liver lesion 10/18/2019-adenocarcinoma consistent with history of colorectal carcinoma  2. Deaf 3. Right epididymal cyst removal 03/22/2018 4. Asthma 5. Port-A-Cath placement, [name], 12/23/2018 6. Neutropenia secondary to chemotherapy-Udenyca added for cycle 2 FOLFOX 7. 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  A sign language interpreter was present throughout today's appointment.  Dr. Benay Spice reviewed the result from the liver biopsy which showed adenocarcinoma consistent with colorectal carcinoma and recommended treatment with FOLFIRI/Avastin.  We discussed potential toxicities associated with irinotecan including bone marrow toxicity, hair loss, diarrhea, nausea/vomiting.  He will obtain Imodium to have on hand at home.  He has had 5-fluorouracil in the past and is familiar with potential toxicities.  We discussed potential toxicities associated with Avastin including an allergic reaction, high blood pressure, nosebleeds, posterior reversible encephalopathy syndrome, increased risk of heart attack/stroke, bleeding, delayed wound healing, bowel perforation, proteinuria.  He agrees to proceed.  We will request foundation 1 testing on the liver biopsy.  He will return for  lab, follow-up, cycle 1 FOLFIRI/Avastin on 11/01/2019.  He will contact the office in the interim  with any problems.  Patient seen with Dr. Benay Spice.    Ned Card ANP/GNP-BC   10/21/2019  1:16 PM This was a shared visit with Ned Card.  Alexander Reeves underwent a liver biopsy.  This confirmed metastatic colon cancer.  We discussed treatment options with Alexander Reeves via the aid of a sign language interpreter.  I recommend systemic therapy with FOLFIRI/bevacizumab.  We reviewed potential toxicities associated with this regimen.  He agrees to proceed.  We will follow up on results of next generation sequencing on the liver biopsy to see if he will be eligible for an EGFR inhibitor and other targeted therapies.  Julieanne Manson, MD

## 2019-10-21 NOTE — Progress Notes (Signed)
DISCONTINUE OFF PATHWAY REGIMEN - Colorectal   [id number]:FOLFOX (q14d) **2 cycles per order sheet**:   A cycle is every 14 days:     Oxaliplatin      Leucovorin      Fluorouracil      Fluorouracil   **Always confirm dose/schedule in your pharmacy ordering system**  REASON: Disease Progression PRIOR TREATMENT: Off Pathway: FOLFOX (q14d) **2 cycles per order sheet** TREATMENT RESPONSE: Progressive Disease (PD)  START ON PATHWAY REGIMEN - Colorectal     A cycle is every 14 days:     Bevacizumab-xxxx      Irinotecan      Leucovorin      Fluorouracil      Fluorouracil   **Always confirm dose/schedule in your pharmacy ordering system**  Patient Characteristics: Distant Metastases, Nonsurgical Candidate, KRAS/NRAS Mutation Positive/Unknown (BRAF V600 Wild-Type/Unknown), Standard Cytotoxic Therapy, First Line Standard Cytotoxic Therapy, Bevacizumab Eligible, PS = 0,1 Tumor Location: Colon Therapeutic Status: Distant Metastases Microsatellite/Mismatch Repair Status: MSS/pMMR BRAF Mutation Status: Awaiting Test Results KRAS/NRAS Mutation Status: Awaiting Test Results Standard Cytotoxic Line of Therapy: First Line Standard Cytotoxic Therapy ECOG Performance Status: 0 Bevacizumab Eligibility: Eligible Intent of Therapy: Non-Curative / Palliative Intent, Discussed with Patient

## 2019-10-21 NOTE — Telephone Encounter (Signed)
-----   Message from Owens Shark, NP sent at 10/21/2019  1:03 PM EDT ----- Contact pathology--foundation 1 on liver biopsy 10/18/2019

## 2019-10-21 NOTE — Telephone Encounter (Signed)
email sent per pathology request email sent to both Luellen Pucker and Signa Kell

## 2019-10-25 ENCOUNTER — Telehealth: Payer: Self-pay | Admitting: Nurse Practitioner

## 2019-10-25 NOTE — Telephone Encounter (Signed)
Scheduled appointments per 10/8 los. Called patient, no answer. Left message for patient with appointments dates and times. Let patient know there would be a gap between doctor's visit and infusion. Appointments were scheduled that way because there were no other available OV appointments or Infusion appointments.

## 2019-10-26 NOTE — Progress Notes (Signed)
Pharmacist Chemotherapy Monitoring - Initial Assessment    Anticipated start date: 11/01/19   Regimen:  . Are orders appropriate based on the patient's diagnosis, regimen, and cycle? Yes . Does the plan date match the patient's scheduled date? Yes . Is the sequencing of drugs appropriate? Yes . Are the premedications appropriate for the patient's regimen? Yes . Prior Authorization for treatment is: Pending o If applicable, is the correct biosimilar selected based on the patient's insurance? not applicable  Organ Function and Labs: [name] Are dose adjustments needed based on the patient's renal function, hepatic function, or hematologic function? No . Are appropriate labs ordered prior to the start of patient's treatment? Yes . Other organ system assessment, if indicated: bevacizumab: baseline BP . The following baseline labs, if indicated, have been ordered: N/A  Dose Assessment: . Are the drug doses appropriate? Yes . Are the following correct: o Drug concentrations Yes o IV fluid compatible with drug Yes o Administration routes Yes o Timing of therapy Yes . If applicable, does the patient have documented access for treatment and/or plans for port-a-cath placement? not applicable . If applicable, have lifetime cumulative doses been properly documented and assessed? not applicable Lifetime Dose Tracking  . Oxaliplatin: 641.098 mg/m2 (1,200 mg) = 106.85 % of the maximum lifetime dose of 600 mg/m2  o   Toxicity Monitoring/Prevention: . The patient has the following take home antiemetics prescribed: N/A . The patient has the following take home medications prescribed: N/A . Medication allergies and previous infusion related reactions, if applicable, have been reviewed and addressed. Yes . The patient's current medication list has been assessed for drug-drug interactions with their chemotherapy regimen. no significant drug-drug interactions were identified on review.  Order Review: . Are  the treatment plan orders signed? No . Is the patient scheduled to see a provider prior to their treatment? Yes  I verify that I have reviewed each item in the above checklist and answered each question accordingly.  Madeeha Costantino K 10/26/2019 8:48 AM

## 2019-10-29 ENCOUNTER — Other Ambulatory Visit: Payer: Self-pay | Admitting: Oncology

## 2019-11-01 ENCOUNTER — Inpatient Hospital Stay: Payer: Medicare Other

## 2019-11-01 ENCOUNTER — Encounter: Payer: Self-pay | Admitting: Nurse Practitioner

## 2019-11-01 ENCOUNTER — Inpatient Hospital Stay (HOSPITAL_BASED_OUTPATIENT_CLINIC_OR_DEPARTMENT_OTHER): Payer: Medicare Other | Admitting: Nurse Practitioner

## 2019-11-01 ENCOUNTER — Ambulatory Visit (HOSPITAL_BASED_OUTPATIENT_CLINIC_OR_DEPARTMENT_OTHER): Payer: Medicare Other | Admitting: Medical

## 2019-11-01 ENCOUNTER — Other Ambulatory Visit: Payer: Self-pay

## 2019-11-01 VITALS — BP 125/82 | HR 90 | Temp 96.1°F | Resp 18 | Ht 70.0 in | Wt 146.3 lb

## 2019-11-01 VITALS — BP 122/84 | HR 79 | Resp 16

## 2019-11-01 DIAGNOSIS — C189 Malignant neoplasm of colon, unspecified: Secondary | ICD-10-CM

## 2019-11-01 DIAGNOSIS — C182 Malignant neoplasm of ascending colon: Secondary | ICD-10-CM

## 2019-11-01 DIAGNOSIS — D229 Melanocytic nevi, unspecified: Secondary | ICD-10-CM

## 2019-11-01 DIAGNOSIS — Z79899 Other long term (current) drug therapy: Secondary | ICD-10-CM | POA: Diagnosis not present

## 2019-11-01 DIAGNOSIS — Z95828 Presence of other vascular implants and grafts: Secondary | ICD-10-CM

## 2019-11-01 DIAGNOSIS — C787 Secondary malignant neoplasm of liver and intrahepatic bile duct: Secondary | ICD-10-CM | POA: Diagnosis not present

## 2019-11-01 DIAGNOSIS — C779 Secondary and unspecified malignant neoplasm of lymph node, unspecified: Secondary | ICD-10-CM | POA: Diagnosis not present

## 2019-11-01 DIAGNOSIS — Z5112 Encounter for antineoplastic immunotherapy: Secondary | ICD-10-CM | POA: Diagnosis not present

## 2019-11-01 DIAGNOSIS — Z5111 Encounter for antineoplastic chemotherapy: Secondary | ICD-10-CM | POA: Diagnosis not present

## 2019-11-01 DIAGNOSIS — Z0000 Encounter for general adult medical examination without abnormal findings: Secondary | ICD-10-CM

## 2019-11-01 LAB — CMP (CANCER CENTER ONLY)
ALT: 16 U/L (ref 0–44)
AST: 21 U/L (ref 15–41)
Albumin: 3.8 g/dL (ref 3.5–5.0)
Alkaline Phosphatase: 52 U/L (ref 38–126)
Anion gap: 7 (ref 5–15)
BUN: 12 mg/dL (ref 6–20)
CO2: 26 mmol/L (ref 22–32)
Calcium: 9.6 mg/dL (ref 8.9–10.3)
Chloride: 107 mmol/L (ref 98–111)
Creatinine: 1.1 mg/dL (ref 0.61–1.24)
GFR, Estimated: >60 mL/min (ref >60)
Glucose, Bld: 97 mg/dL (ref 70–99)
Potassium: 4.1 mmol/L (ref 3.5–5.1)
Sodium: 140 mmol/L (ref 135–145)
Total Bilirubin: 0.3 mg/dL (ref 0.3–1.2)
Total Protein: 7.2 g/dL (ref 6.5–8.1)

## 2019-11-01 LAB — CBC WITH DIFFERENTIAL (CANCER CENTER ONLY)
Abs Immature Granulocytes: 0.01 10*3/uL (ref 0.00–0.07)
Basophils Absolute: 0.1 10*3/uL (ref 0.0–0.1)
Basophils Relative: 1 %
Eosinophils Absolute: 0.7 10*3/uL — ABNORMAL HIGH (ref 0.0–0.5)
Eosinophils Relative: 13 %
HCT: 36.7 % — ABNORMAL LOW (ref 39.0–52.0)
Hemoglobin: 11.6 g/dL — ABNORMAL LOW (ref 13.0–17.0)
Immature Granulocytes: 0 %
Lymphocytes Relative: 21 %
Lymphs Abs: 1.1 10*3/uL (ref 0.7–4.0)
MCH: 26.6 pg (ref 26.0–34.0)
MCHC: 31.6 g/dL (ref 30.0–36.0)
MCV: 84.2 fL (ref 80.0–100.0)
Monocytes Absolute: 0.5 10*3/uL (ref 0.1–1.0)
Monocytes Relative: 9 %
Neutro Abs: 3 10*3/uL (ref 1.7–7.7)
Neutrophils Relative %: 56 %
Platelet Count: 200 10*3/uL (ref 150–400)
RBC: 4.36 MIL/uL (ref 4.22–5.81)
RDW: 14.2 % (ref 11.5–15.5)
WBC Count: 5.4 10*3/uL (ref 4.0–10.5)
nRBC: 0 % (ref 0.0–0.2)

## 2019-11-01 MED ORDER — SODIUM CHLORIDE 0.9 % IV SOLN
180.0000 mg/m2 | Freq: Once | INTRAVENOUS | Status: AC
  Administered 2019-11-01: 320 mg via INTRAVENOUS
  Filled 2019-11-01: qty 15

## 2019-11-01 MED ORDER — FLUOROURACIL CHEMO INJECTION 2.5 GM/50ML
400.0000 mg/m2 | Freq: Once | INTRAVENOUS | Status: AC
  Administered 2019-11-01: 700 mg via INTRAVENOUS
  Filled 2019-11-01: qty 14

## 2019-11-01 MED ORDER — INFLUENZA VAC SPLIT QUAD 0.5 ML IM SUSY
PREFILLED_SYRINGE | INTRAMUSCULAR | Status: AC
  Filled 2019-11-01: qty 0.5

## 2019-11-01 MED ORDER — SODIUM CHLORIDE 0.9 % IV SOLN
Freq: Once | INTRAVENOUS | Status: DC
  Filled 2019-11-01: qty 250

## 2019-11-01 MED ORDER — INFLUENZA VAC SPLIT QUAD 0.5 ML IM SUSY: 0.5000 mL | PREFILLED_SYRINGE | Freq: Once | INTRAMUSCULAR | Status: DC

## 2019-11-01 MED ORDER — SODIUM CHLORIDE 0.9 % IV SOLN
10.0000 mg | Freq: Once | INTRAVENOUS | Status: AC
  Administered 2019-11-01: 10 mg via INTRAVENOUS
  Filled 2019-11-01: qty 10

## 2019-11-01 MED ORDER — SODIUM CHLORIDE 0.9 % IV SOLN
Freq: Once | INTRAVENOUS | Status: AC
  Filled 2019-11-01: qty 250

## 2019-11-01 MED ORDER — SODIUM CHLORIDE 0.9% FLUSH
10.0000 mL | INTRAVENOUS | Status: DC | PRN
  Administered 2019-11-01: 10 mL
  Filled 2019-11-01: qty 10

## 2019-11-01 MED ORDER — SODIUM CHLORIDE 0.9 % IV SOLN
2400.0000 mg/m2 | INTRAVENOUS | Status: DC
  Administered 2019-11-01: 4300 mg via INTRAVENOUS
  Filled 2019-11-01: qty 86

## 2019-11-01 MED ORDER — SODIUM CHLORIDE 0.9 % IV SOLN
5.0000 mg/kg | Freq: Once | INTRAVENOUS | Status: AC
  Administered 2019-11-01: 300 mg via INTRAVENOUS
  Filled 2019-11-01: qty 12

## 2019-11-01 MED ORDER — FAMOTIDINE IN NACL 20-0.9 MG/50ML-% IV SOLN
20.0000 mg | Freq: Once | INTRAVENOUS | Status: AC
  Administered 2019-11-01: 20 mg via INTRAVENOUS

## 2019-11-01 MED ORDER — PALONOSETRON HCL INJECTION 0.25 MG/5ML
INTRAVENOUS | Status: AC
  Filled 2019-11-01: qty 5

## 2019-11-01 MED ORDER — PALONOSETRON HCL INJECTION 0.25 MG/5ML
0.2500 mg | Freq: Once | INTRAVENOUS | Status: AC
  Administered 2019-11-01: 0.25 mg via INTRAVENOUS

## 2019-11-01 MED ORDER — SODIUM CHLORIDE 0.9 % IV SOLN
400.0000 mg/m2 | Freq: Once | INTRAVENOUS | Status: AC
  Administered 2019-11-01: 716 mg via INTRAVENOUS
  Filled 2019-11-01: qty 35.8

## 2019-11-01 MED ORDER — ATROPINE SULFATE 1 MG/ML IJ SOLN
INTRAMUSCULAR | Status: AC
  Filled 2019-11-01: qty 1

## 2019-11-01 NOTE — Progress Notes (Signed)
Patient c/o tingling "all over my body" at 1340 during Irinotecan/Leucovorin infusion.    RN paused medication. VS obtained via flow sheets.  Pt denies any chest pain, shortness of breath, or abdominal cramping.   Sandi Mealy, PA-C at bedside.  Verbal orders given for Pepcid 20mg , and resume after 10 minutes after completion of medication if symptoms resolve.  Per patient symptoms improved after completion of Pepcid, medication restarted.  Will continue to monitor throughout remainder of infusion.

## 2019-11-01 NOTE — Progress Notes (Signed)
[facility]   [phone]) [phone] [phone]) [phone]   Clinic Follow up Note   Patient Care Team: [name], NP as PCP - General (Internal Medicine) 11/01/2019  CHIEF COMPLAINT: Follow-up colon cancer  CURRENT THERAPY: Pending start of first-line FOLFIRI/Avastin  INTERVAL HISTORY: Alexander Reeves returns with sign language interpreter for follow-up and to start treatment as scheduled.  He has intermittent right upper quadrant pain from liver biopsy that radiates to left upper quadrant and left back.  He does not require any pain medication.  His energy and appetite are adequate.  Has any nausea, vomiting, constipation, diarrhea.  He has mild residual neuropathy in his toes from previous FOLFOX.  No difficulties with ambulation, balance, or other functions.  His exertional dyspnea is stable, no new cough, chest pain.  Denies any fever or chills.  When he was on gemfibrozil and fenofibrate he had dizziness and "problems with his throat" like throbbing that have nearly resolved now that he is only on fenofibrate.  Denies any dysphagia, odynophagia, nasal congestion or cold symptoms.  He has noticed that multiple small moles have popped up all over his body bilateral arms, between fingers, abdomen and legs and he is concerned.   MEDICAL HISTORY:  Past Medical History:  Diagnosis Date  . Arthritis    KNEES  . Asthma   . Deaf   . GERD (gastroesophageal reflux disease)    OCC  . Scrotal cyst   . Thyroid disease   . Urinary frequency    OCC    SURGICAL HISTORY: Past Surgical History:  Procedure Laterality Date  . BIOPSY  11/19/2018   Procedure: BIOPSY;  Surgeon: Thornton Park, MD;  Location: [facility];  Service: Gastroenterology;;  . BIOPSY  09/20/2019   Procedure: BIOPSY;  Surgeon: Thornton Park, MD;  Location: [facility];  Service: Gastroenterology;;  . BLADDER SURGERY  YRS AGO  . COLONOSCOPY WITH PROPOFOL N/A 11/19/2018   Procedure: COLONOSCOPY  WITH PROPOFOL;  Surgeon: Thornton Park, MD;  Location: [facility];  Service: Gastroenterology;  Laterality: N/A;  . COLONOSCOPY WITH PROPOFOL N/A 09/20/2019   Procedure: COLONOSCOPY WITH PROPOFOL;  Surgeon: Thornton Park, MD;  Location: [facility];  Service: Gastroenterology;  Laterality: N/A;  . EPIDIDYMECTOMY Right 03/22/2018   Procedure: SCROTAL EXPLORATION RIGHT  PARTIAL EPIDIDYMECTOMY;  Surgeon: [name], MD;  Location: [facility];  Service: Urology;  Laterality: Right;  . HEMORRHOID SURGERY  WHEN YOUNG  . LAPAROTOMY N/A 11/19/2018   Procedure: EXPLORATORY LAPAROTOMY;  Surgeon: Rolm Bookbinder, MD;  Location: [facility];  Service: General;  Laterality: N/A;  . NO PAST SURGERIES    . PARTIAL COLECTOMY Right 11/19/2018   Procedure: PARTIAL COLECTOMY;  Surgeon: Rolm Bookbinder, MD;  Location: [facility];  Service: General;  Laterality: Right;  . POLYPECTOMY  09/20/2019   Procedure: POLYPECTOMY;  Surgeon: Thornton Park, MD;  Location: [facility];  Service: Gastroenterology;;  . [name] PLACEMENT Right 12/23/2018   Procedure: INSERTION PORT-A-CATH WITH ULTRASOUND GUIDANCE;  Surgeon: Rolm Bookbinder, MD;  Location: [facility];  Service: General;  Laterality: Right;  . SUBMUCOSAL TATTOO INJECTION  11/19/2018   Procedure: SUBMUCOSAL TATTOO INJECTION;  Surgeon: Thornton Park, MD;  Location: [facility];  Service: Gastroenterology;;  . [name]    I have reviewed the social history and family history with the patient and they are unchanged from previous note.  ALLERGIES:  is allergic to cheese, eggs or egg-derived products, and penicillins.  MEDICATIONS:  Current Outpatient Medications  Medication Sig Dispense Refill  . albuterol (PROVENTIL) (2.5 MG/3ML) 0.083% nebulizer solution Take 3 mLs (2.5 mg total) by nebulization every 6 (six) hours as needed for wheezing or shortness of breath. 75 mL 12  . albuterol (VENTOLIN HFA) 108  (90 Base) MCG/ACT inhaler Inhale 2 puffs into the lungs every 4 (four) hours as needed for wheezing or shortness of breath. 6.7 g 3  . APPLE CIDER VINEGAR PO Take 1 capsule by mouth every other day.    . fenofibrate (TRICOR) 145 MG tablet TAKE 1 TABLET (145 MG TOTAL) BY MOUTH DAILY. 90 tablet 3  . fluticasone furoate-vilanterol (BREO ELLIPTA) 200-25 MCG/INH AEPB Inhale 1 puff into the lungs daily. (Patient taking differently: Inhale 1 puff into the lungs daily as needed (shortness of breath). ) 60 each 11  . ibuprofen (ADVIL) 600 MG tablet Take 1 tablet (600 mg total) by mouth every 8 (eight) hours as needed for moderate pain. 90 tablet 0  . lidocaine-prilocaine (EMLA) cream Apply 1 application topically as needed. To feet (Patient taking differently: Apply 1 application topically daily as needed (foot pain). ) 30 g 2  . loratadine (CLARITIN) 10 MG tablet Take 10 mg by mouth daily as needed for allergies.    . methocarbamol (ROBAXIN) 500 MG tablet Take 1 tablet (500 mg total) by mouth every 8 (eight) hours as needed for muscle spasms. 90 tablet 0  . montelukast (SINGULAIR) 10 MG tablet Take 1 tablet (10 mg total) by mouth at bedtime. 90 tablet 1  . Naphazoline HCl (CLEAR EYES OP) Place 1 drop into both eyes daily as needed (redness/dryness).    . pantoprazole (PROTONIX) 40 MG tablet Take 1 tablet (40 mg total) by mouth daily. 30 tablet 3  . Phenazopyridine HCl (AZO TABS PO) Take 1 tablet by mouth daily as needed (urninary pain).      No current facility-administered medications for this visit.    PHYSICAL EXAMINATION: ECOG PERFORMANCE STATUS: 0 - Asymptomatic  Vitals:   11/01/19 0954  BP: 125/82  Pulse: 90  Resp: 18  Temp: (!) 96.1 F (35.6 C)  SpO2: 99%   Filed Weights   11/01/19 0954  Weight: 146 lb 4.8 oz (66.4 kg)    GENERAL:alert, no distress and comfortable SKIN: multiple normal appearing nevi, no rash to exposed skin EYES:  sclera clear OROPHARYNX: No thrush or  ulcers NECK: Without mass LUNGS: clear with normal breathing effort HEART: regular rate & rhythm, no lower extremity edema ABDOMEN:abdomen soft, non-tender and normal bowel sounds.  No hepatomegaly NEURO: alert & oriented x 3 with fluent speech PAC without erythema  LABORATORY DATA:  I have reviewed the data as listed CBC Latest Ref Rng & Units 11/01/2019 10/18/2019 09/28/2019  WBC 4.0 - 10.5 K/uL 5.4 4.8 5.4  Hemoglobin 13.0 - 17.0 g/dL 11.6(L) 12.3(L) 12.5(L)  Hematocrit 39 - 52 % 36.7(L) 40.1 39.2  Platelets 150 - 400 K/uL 200 236 208     CMP Latest Ref Rng & Units 11/01/2019 09/28/2019 08/25/2019  Glucose 70 - 99 mg/dL 97 96 100(H)  BUN 6 - 20 mg/dL 12 21(H) 17  Creatinine 0.61 - 1.24 mg/dL 1.10 1.45(H) 1.25  Sodium 135 - 145 mmol/L 140 142 143  Potassium 3.5 - 5.1 mmol/L 4.1 4.4 4.7  Chloride 98 - 111 mmol/L 107 109 107(H)  CO2 22 - 32 mmol/L '26 25 26  ' Calcium 8.9 - 10.3 mg/dL 9.6 9.6 10.0  Total Protein 6.5 - 8.1 g/dL 7.2  7.7 7.5  Total Bilirubin 0.3 - 1.2 mg/dL 0.3 0.3 0.2  Alkaline Phos 38 - 126 U/L 52 65 51  AST 15 - 41 U/L '21 18 20  ' ALT 0 - 44 U/L '16 12 13      ' RADIOGRAPHIC STUDIES: I have personally reviewed the radiological images as listed and agreed with the findings in the report. No results found.   Assessment/plan 1. Moderately differentiated adenocarcinoma ascending colon, stage IIIb (pT3pN1), status post a right colectomy 11/19/2018 ? Lymphovascular and perineural invasion present, 2/14 lymph nodes positive, tumor deposits present ? Positive radial margin, no loss of mismatch repair protein expression, discussed case with Dr. Craig Staggers mass with surrounding inflammation at multiple margins, no gross residual disease, unclear which is the "positive "radial margin, further surgery and radiation not recommended ? Colonoscopy 11/19/2018-completely obstructing mid ascending colon mass, could not be passed with endoscope, biopsy confirmed invasive  adenocarcinoma ? CT abdomen/pelvis 11/18/2018-wall thickening at the mid and distal ascending colon with mild distention of the proximal ascending colon and cecum ? CTs 10/17/2018--no acute findings, no chest lymphadenopathy, lungs clear ? Cycle 1 FOLFOX 12/28/2018 ? Cycle 2 FOLFOX 01/11/2019, Udenyca added ? Cycle 3 FOLFOX 01/25/2019,Udenyca held due to bone pain ? Cycle 4 FOLFOX 02/21/2019, Udenyca added ? Cycle 5 FOLFOX 03/09/2019, Udenyca ? Cycle 6 FOLFOX 03/21/2019, Udenyca ? Cycle 7 FOLFOX 04/04/2019, Udenyca ? Cycle 8 FOLFOX 04/18/2019, Udenyca ? Cycle 9 FOLFOX 05/02/2019, Udenyca ? Cycle 10 FOLFOX 05/16/2019, oxaliplatin, 5-FU bolus, and Udenyca held ? Cycle 11 FOLFOX 05/30/2019, oxaliplatin, 5-FU bolus, and Udenyca held ? Cycle 12 FOLFOX 06/15/2019, oxaliplatin, 5-FU bolus and Udenyca held ? CTs 09/29/2019-new hypodense enhancing liver masses consistent with metastatic disease, indistinct marginated nodularity below the pancreas head-likely small lymph nodes ? Biopsy liver lesion 10/18/2019-adenocarcinoma consistent with history of colorectal carcinoma ? Cycle 1 FOLFIRI/Avastin 11/01/2019  2. Deaf 3. Right epididymal cyst removal 03/22/2018 4. Asthma 5. Port-A-Cath placement, [name], 12/23/2018 6. Neutropenia secondary to chemotherapy-Udenyca added for cycle 2 FOLFOX 7. 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 Brander appears stable.  He recovered well from liver biopsy.  We reviewed symptom management for his intermittent pain.  We will monitor his pre-existing peripheral neuropathy which is currently mild and intermittent.    We reviewed the CBC and CMP from today.  He will proceed with cycle 1 FOLFIRI and bevacizumab.  We again reviewed potential toxicities, symptom management, treatment goal, and call/return precautions.  He has multiple normal appearing nevi scattered on his  arms, interdigits, trunk, legs.  I do not feel these are atypical or oncology-related.  However, he is concerned and requesting a referral to dermatology which I have placed today.  F/u in 2 weeks with cycle 2. The plan is to restage after cycle 5. Case discussed with Dr. Benay Spice.    Orders Placed This Encounter  Procedures  . Ambulatory referral to Dermatology    Referral Priority:   Routine    Referral Type:   Consultation    Referral Reason:   Specialty Services Required    Requested Specialty:   Dermatology    Number of Visits Requested:   1   All questions were answered. The patient knows to call the clinic with any problems, questions or concerns. No barriers to learning were detected. Total encounter time was 30 minutes.      Alexander Feeling, NP 11/01/19

## 2019-11-01 NOTE — Patient Instructions (Signed)
[facility] Discharge Instructions for Patients Receiving Chemotherapy  Today you received the following chemotherapy agents: Avastin, Leucovorin, Irinotecan, and 5FU.   To help prevent nausea and vomiting after your treatment, we encourage you to take your nausea medication as directed.    If you develop nausea and vomiting that is not controlled by your nausea medication, call the clinic.   BELOW ARE SYMPTOMS THAT SHOULD BE REPORTED IMMEDIATELY:  *FEVER GREATER THAN 100.5 F  *CHILLS WITH OR WITHOUT FEVER  NAUSEA AND VOMITING THAT IS NOT CONTROLLED WITH YOUR NAUSEA MEDICATION  *UNUSUAL SHORTNESS OF BREATH  *UNUSUAL BRUISING OR BLEEDING  TENDERNESS IN MOUTH AND THROAT WITH OR WITHOUT PRESENCE OF ULCERS  *URINARY PROBLEMS  *BOWEL PROBLEMS  UNUSUAL RASH Items with * indicate a potential emergency and should be followed up as soon as possible.  Feel free to call the clinic should you have any questions or concerns. The clinic phone number is [phone].  Please show the [facility] at check-in to the Emergency Department and triage nurse.  Bevacizumab injection What is this medicine? BEVACIZUMAB (be va SIZ yoo mab) is a monoclonal antibody. It is used to treat many types of cancer. This medicine may be used for other purposes; ask your health care provider or pharmacist if you have questions. COMMON BRAND NAME(S): Avastin, MVASI, Zirabev What should I tell my health care provider before I take this medicine? They need to know if you have any of these conditions:  diabetes  heart disease  high blood pressure  history of coughing up blood  prior anthracycline chemotherapy (e.g., doxorubicin, daunorubicin, epirubicin)  recent or ongoing radiation therapy  recent or planning to have surgery  stroke  an unusual or allergic reaction to bevacizumab, hamster proteins, mouse proteins, other medicines, foods, dyes, or preservatives  pregnant or  trying to get pregnant  breast-feeding How should I use this medicine? This medicine is for infusion into a vein. It is given by a health care professional in a hospital or clinic setting. Talk to your pediatrician regarding the use of this medicine in children. Special care may be needed. Overdosage: If you think you have taken too much of this medicine contact a poison control center or emergency room at once. NOTE: This medicine is only for you. Do not share this medicine with others. What if I miss a dose? It is important not to miss your dose. Call your doctor or health care professional if you are unable to keep an appointment. What may interact with this medicine? Interactions are not expected. This list may not describe all possible interactions. Give your health care provider a list of all the medicines, herbs, non-prescription drugs, or dietary supplements you use. Also tell them if you smoke, drink alcohol, or use illegal drugs. Some items may interact with your medicine. What should I watch for while using this medicine? Your condition will be monitored carefully while you are receiving this medicine. You will need important blood work and urine testing done while you are taking this medicine. This medicine may increase your risk to bruise or bleed. Call your doctor or health care professional if you notice any unusual bleeding. Before having surgery, talk to your health care provider to make sure it is ok. This drug can increase the risk of poor healing of your surgical site or wound. You will need to stop this drug for 28 days before surgery. After surgery, wait at least 28 days before restarting  this drug. Make sure the surgical site or wound is healed enough before restarting this drug. Talk to your health care provider if questions. Do not become pregnant while taking this medicine or for 6 months after stopping it. Women should inform their doctor if they wish to become pregnant or  think they might be pregnant. There is a potential for serious side effects to an unborn child. Talk to your health care professional or pharmacist for more information. Do not breast-feed an infant while taking this medicine and for 6 months after the last dose. This medicine has caused ovarian failure in some women. This medicine may interfere with the ability to have a child. You should talk to your doctor or health care professional if you are concerned about your fertility. What side effects may I notice from receiving this medicine? Side effects that you should report to your doctor or health care professional as soon as possible:  allergic reactions like skin rash, itching or hives, swelling of the face, lips, or tongue  chest pain or chest tightness  chills  coughing up blood  high fever  seizures  severe constipation  signs and symptoms of bleeding such as bloody or black, tarry stools; red or dark-brown urine; spitting up blood or brown material that looks like coffee grounds; red spots on the skin; unusual bruising or bleeding from the eye, gums, or nose  signs and symptoms of a blood clot such as breathing problems; chest pain; severe, sudden headache; pain, swelling, warmth in the leg  signs and symptoms of a stroke like changes in vision; confusion; trouble speaking or understanding; severe headaches; sudden numbness or weakness of the face, arm or leg; trouble walking; dizziness; loss of balance or coordination  stomach pain  sweating  swelling of legs or ankles  vomiting  weight gain Side effects that usually do not require medical attention (report to your doctor or health care professional if they continue or are bothersome):  back pain  changes in taste  decreased appetite  dry skin  nausea  tiredness This list may not describe all possible side effects. Call your doctor for medical advice about side effects. You may report side effects to FDA at  1-800-FDA-1088. Where should I keep my medicine? This drug is given in a hospital or clinic and will not be stored at home. NOTE: This sheet is a summary. It may not cover all possible information. If you have questions about this medicine, talk to your doctor, pharmacist, or health care provider.  2020 Elsevier/Gold Standard (2018-10-27 10:50:46)  Irinotecan injection What is this medicine? IRINOTECAN (ir in oh TEE kan ) is a chemotherapy drug. It is used to treat colon and rectal cancer. This medicine may be used for other purposes; ask your health care provider or pharmacist if you have questions. COMMON BRAND NAME(S): Camptosar What should I tell my health care provider before I take this medicine? They need to know if you have any of these conditions:  dehydration  diarrhea  infection (especially a virus infection such as chickenpox, cold sores, or herpes)  liver disease  low blood counts, like low white cell, platelet, or red cell counts  low levels of calcium, magnesium, or potassium in the blood  recent or ongoing radiation therapy  an unusual or allergic reaction to irinotecan, other medicines, foods, dyes, or preservatives  pregnant or trying to get pregnant  breast-feeding How should I use this medicine? This drug is given as an infusion  into a vein. It is administered in a hospital or clinic by a specially trained health care professional. Talk to your pediatrician regarding the use of this medicine in children. Special care may be needed. Overdosage: If you think you have taken too much of this medicine contact a poison control center or emergency room at once. NOTE: This medicine is only for you. Do not share this medicine with others. What if I miss a dose? It is important not to miss your dose. Call your doctor or health care professional if you are unable to keep an appointment. What may interact with this medicine? This medicine may interact with the  following medications:  antiviral medicines for HIV or AIDS  certain antibiotics like rifampin or rifabutin  certain medicines for fungal infections like itraconazole, ketoconazole, posaconazole, and voriconazole  certain medicines for seizures like carbamazepine, phenobarbital, phenotoin  clarithromycin  gemfibrozil  nefazodone  St. John's Wort This list may not describe all possible interactions. Give your health care provider a list of all the medicines, herbs, non-prescription drugs, or dietary supplements you use. Also tell them if you smoke, drink alcohol, or use illegal drugs. Some items may interact with your medicine. What should I watch for while using this medicine? Your condition will be monitored carefully while you are receiving this medicine. You will need important blood work done while you are taking this medicine. This drug may make you feel generally unwell. This is not uncommon, as chemotherapy can affect healthy cells as well as cancer cells. Report any side effects. Continue your course of treatment even though you feel ill unless your doctor tells you to stop. In some cases, you may be given additional medicines to help with side effects. Follow all directions for their use. You may get drowsy or dizzy. Do not drive, use machinery, or do anything that needs mental alertness until you know how this medicine affects you. Do not stand or sit up quickly, especially if you are an older patient. This reduces the risk of dizzy or fainting spells. Call your health care professional for advice if you get a fever, chills, or sore throat, or other symptoms of a cold or flu. Do not treat yourself. This medicine decreases your body's ability to fight infections. Try to avoid being around people who are sick. Avoid taking products that contain aspirin, acetaminophen, ibuprofen, naproxen, or ketoprofen unless instructed by your doctor. These medicines may hide a fever. This medicine  may increase your risk to bruise or bleed. Call your doctor or health care professional if you notice any unusual bleeding. Be careful brushing and flossing your teeth or using a toothpick because you may get an infection or bleed more easily. If you have any dental work done, tell your dentist you are receiving this medicine. Do not become pregnant while taking this medicine or for 6 months after stopping it. Women should inform their health care professional if they wish to become pregnant or think they might be pregnant. Men should not father a child while taking this medicine and for 3 months after stopping it. There is potential for serious side effects to an unborn child. Talk to your health care professional for more information. Do not breast-feed an infant while taking this medicine or for 7 days after stopping it. This medicine has caused ovarian failure in some women. This medicine may make it more difficult to get pregnant. Talk to your health care professional if you are concerned about your fertility.  This medicine has caused decreased sperm counts in some men. This may make it more difficult to father a child. Talk to your health care professional if you are concerned about your fertility. What side effects may I notice from receiving this medicine? Side effects that you should report to your doctor or health care professional as soon as possible:  allergic reactions like skin rash, itching or hives, swelling of the face, lips, or tongue  chest pain  diarrhea  flushing, runny nose, sweating during infusion  low blood counts - this medicine may decrease the number of white blood cells, red blood cells and platelets. You may be at increased risk for infections and bleeding.  nausea, vomiting  pain, swelling, warmth in the leg  signs of decreased platelets or bleeding - bruising, pinpoint red spots on the skin, black, tarry stools, blood in the urine  signs of infection - fever  or chills, cough, sore throat, pain or difficulty passing urine  signs of decreased red blood cells - unusually weak or tired, fainting spells, lightheadedness Side effects that usually do not require medical attention (report to your doctor or health care professional if they continue or are bothersome):  constipation  hair loss  headache  loss of appetite  mouth sores  stomach pain This list may not describe all possible side effects. Call your doctor for medical advice about side effects. You may report side effects to FDA at 1-800-FDA-1088. Where should I keep my medicine? This drug is given in a hospital or clinic and will not be stored at home. NOTE: This sheet is a summary. It may not cover all possible information. If you have questions about this medicine, talk to your doctor, pharmacist, or health care provider.  2020 Elsevier/Gold Standard (2018-02-19 10:09:17)

## 2019-11-02 ENCOUNTER — Telehealth: Payer: Self-pay | Admitting: Nurse Practitioner

## 2019-11-02 ENCOUNTER — Telehealth: Payer: Self-pay | Admitting: Medical

## 2019-11-02 MED FILL — FENOFIBRATE 145 MG TABLET: 145 | 90 days supply | Qty: 90 | Fill #0

## 2019-11-02 NOTE — Telephone Encounter (Signed)
Scheduled appointment per 10/19. Spoke to patient who is aware of appointments dates and times.

## 2019-11-02 NOTE — Telephone Encounter (Signed)
Added patient per 10/20 scheduling message called patient but

## 2019-11-03 ENCOUNTER — Inpatient Hospital Stay: Payer: Medicare Other

## 2019-11-03 ENCOUNTER — Other Ambulatory Visit: Payer: Self-pay

## 2019-11-03 VITALS — BP 116/68 | HR 84 | Temp 98.6°F | Resp 18

## 2019-11-03 DIAGNOSIS — C182 Malignant neoplasm of ascending colon: Secondary | ICD-10-CM | POA: Diagnosis not present

## 2019-11-03 DIAGNOSIS — C787 Secondary malignant neoplasm of liver and intrahepatic bile duct: Secondary | ICD-10-CM | POA: Diagnosis not present

## 2019-11-03 DIAGNOSIS — Z79899 Other long term (current) drug therapy: Secondary | ICD-10-CM | POA: Diagnosis not present

## 2019-11-03 DIAGNOSIS — C779 Secondary and unspecified malignant neoplasm of lymph node, unspecified: Secondary | ICD-10-CM | POA: Diagnosis not present

## 2019-11-03 DIAGNOSIS — Z5112 Encounter for antineoplastic immunotherapy: Secondary | ICD-10-CM | POA: Diagnosis not present

## 2019-11-03 DIAGNOSIS — C189 Malignant neoplasm of colon, unspecified: Secondary | ICD-10-CM

## 2019-11-03 DIAGNOSIS — Z5111 Encounter for antineoplastic chemotherapy: Secondary | ICD-10-CM | POA: Diagnosis not present

## 2019-11-03 MED ORDER — PEGFILGRASTIM-CBQV 6 MG/0.6ML ~~LOC~~ SOSY
6.0000 mg | PREFILLED_SYRINGE | Freq: Once | SUBCUTANEOUS | Status: AC
  Administered 2019-11-03: 6 mg via SUBCUTANEOUS

## 2019-11-03 MED ORDER — HEPARIN SOD (PORK) LOCK FLUSH 100 UNIT/ML IV SOLN
500.0000 [IU] | Freq: Once | INTRAVENOUS | Status: AC | PRN
  Administered 2019-11-03: 500 [IU]
  Filled 2019-11-03: qty 5

## 2019-11-03 MED ORDER — PEGFILGRASTIM-CBQV 6 MG/0.6ML ~~LOC~~ SOSY
PREFILLED_SYRINGE | SUBCUTANEOUS | Status: AC
  Filled 2019-11-03: qty 0.6

## 2019-11-03 MED ORDER — SODIUM CHLORIDE 0.9% FLUSH
10.0000 mL | INTRAVENOUS | Status: DC | PRN
  Administered 2019-11-03: 10 mL
  Filled 2019-11-03: qty 10

## 2019-11-08 ENCOUNTER — Inpatient Hospital Stay: Payer: Medicare Other | Admitting: Medical

## 2019-11-08 ENCOUNTER — Ambulatory Visit: Payer: Medicare Other | Admitting: Medical

## 2019-11-08 MED FILL — VENTOLIN HFA 90 MCG INHALER: 108 (90 BAS | 30 days supply | Qty: 18 | Fill #1

## 2019-11-08 MED FILL — PANTOPRAZOLE SOD DR 40 MG T: 40 | 30 days supply | Qty: 30 | Fill #1

## 2019-11-09 NOTE — Progress Notes (Signed)
Mr. Cocuzza was seen in the infusion room today as he was receiving Avastin and irinotecan.  He was noted to have a blood pressure of 122/84, pulse of 79, oxygen saturation of 99% on room air and respirations are 16.  At that time he reported that he was having tingling over his body and nausea.  He has been premedicated with Aloxi and dexamethasone.  He was given Pepcid 20 mg IV with improvement in his symptoms.  He completed his Avastin and irinotecan infusion without any additional issues of concern.  Sandi Mealy, MHS, PA-C Physician Assistant

## 2019-11-10 MED FILL — FENOFIBRATE 145 MG TABLET: 145 | 90 days supply | Qty: 90 | Fill #0

## 2019-11-11 ENCOUNTER — Other Ambulatory Visit: Payer: Self-pay | Admitting: Oncology

## 2019-11-15 ENCOUNTER — Inpatient Hospital Stay: Payer: Medicare Other | Attending: Nurse Practitioner

## 2019-11-15 ENCOUNTER — Encounter: Payer: Self-pay | Admitting: Nurse Practitioner

## 2019-11-15 ENCOUNTER — Inpatient Hospital Stay (HOSPITAL_BASED_OUTPATIENT_CLINIC_OR_DEPARTMENT_OTHER): Payer: Medicare Other | Admitting: Nurse Practitioner

## 2019-11-15 ENCOUNTER — Inpatient Hospital Stay: Payer: Medicare Other

## 2019-11-15 ENCOUNTER — Other Ambulatory Visit: Payer: Self-pay

## 2019-11-15 VITALS — BP 133/88 | HR 98 | Temp 97.6°F | Resp 16 | Ht 70.0 in | Wt 145.8 lb

## 2019-11-15 DIAGNOSIS — C182 Malignant neoplasm of ascending colon: Secondary | ICD-10-CM

## 2019-11-15 DIAGNOSIS — D701 Agranulocytosis secondary to cancer chemotherapy: Secondary | ICD-10-CM | POA: Insufficient documentation

## 2019-11-15 DIAGNOSIS — Z5112 Encounter for antineoplastic immunotherapy: Secondary | ICD-10-CM | POA: Diagnosis not present

## 2019-11-15 DIAGNOSIS — C189 Malignant neoplasm of colon, unspecified: Secondary | ICD-10-CM

## 2019-11-15 DIAGNOSIS — Z5111 Encounter for antineoplastic chemotherapy: Secondary | ICD-10-CM | POA: Diagnosis not present

## 2019-11-15 DIAGNOSIS — Z95828 Presence of other vascular implants and grafts: Secondary | ICD-10-CM

## 2019-11-15 DIAGNOSIS — Z5189 Encounter for other specified aftercare: Secondary | ICD-10-CM | POA: Diagnosis not present

## 2019-11-15 DIAGNOSIS — Z79899 Other long term (current) drug therapy: Secondary | ICD-10-CM | POA: Insufficient documentation

## 2019-11-15 LAB — CBC WITH DIFFERENTIAL (CANCER CENTER ONLY)
Abs Immature Granulocytes: 0.36 10*3/uL — ABNORMAL HIGH (ref 0.00–0.07)
Basophils Absolute: 0 10*3/uL (ref 0.0–0.1)
Basophils Relative: 1 %
Eosinophils Absolute: 0.3 10*3/uL (ref 0.0–0.5)
Eosinophils Relative: 3 %
HCT: 37.6 % — ABNORMAL LOW (ref 39.0–52.0)
Hemoglobin: 11.9 g/dL — ABNORMAL LOW (ref 13.0–17.0)
Immature Granulocytes: 5 %
Lymphocytes Relative: 19 %
Lymphs Abs: 1.5 10*3/uL (ref 0.7–4.0)
MCH: 26.8 pg (ref 26.0–34.0)
MCHC: 31.6 g/dL (ref 30.0–36.0)
MCV: 84.7 fL (ref 80.0–100.0)
Monocytes Absolute: 0.4 10*3/uL (ref 0.1–1.0)
Monocytes Relative: 5 %
Neutro Abs: 5.2 10*3/uL (ref 1.7–7.7)
Neutrophils Relative %: 67 %
Platelet Count: 185 10*3/uL (ref 150–400)
RBC: 4.44 MIL/uL (ref 4.22–5.81)
RDW: 15.4 % (ref 11.5–15.5)
WBC Count: 7.8 10*3/uL (ref 4.0–10.5)
nRBC: 0.4 % — ABNORMAL HIGH (ref 0.0–0.2)

## 2019-11-15 LAB — CMP (CANCER CENTER ONLY)
ALT: 16 U/L (ref 0–44)
AST: 16 U/L (ref 15–41)
Albumin: 3.9 g/dL (ref 3.5–5.0)
Alkaline Phosphatase: 77 U/L (ref 38–126)
Anion gap: 7 (ref 5–15)
BUN: 15 mg/dL (ref 6–20)
CO2: 26 mmol/L (ref 22–32)
Calcium: 9.4 mg/dL (ref 8.9–10.3)
Chloride: 107 mmol/L (ref 98–111)
Creatinine: 1.2 mg/dL (ref 0.61–1.24)
GFR, Estimated: >60 mL/min (ref >60)
Glucose, Bld: 104 mg/dL — ABNORMAL HIGH (ref 70–99)
Potassium: 4.3 mmol/L (ref 3.5–5.1)
Sodium: 140 mmol/L (ref 135–145)
Total Bilirubin: 0.3 mg/dL (ref 0.3–1.2)
Total Protein: 7.2 g/dL (ref 6.5–8.1)

## 2019-11-15 LAB — TOTAL PROTEIN, URINE DIPSTICK: Protein, ur: NEGATIVE mg/dL

## 2019-11-15 MED ORDER — SODIUM CHLORIDE 0.9 % IV SOLN
180.0000 mg/m2 | Freq: Once | INTRAVENOUS | Status: AC
  Administered 2019-11-15: 320 mg via INTRAVENOUS
  Filled 2019-11-15: qty 15

## 2019-11-15 MED ORDER — PALONOSETRON HCL INJECTION 0.25 MG/5ML
0.2500 mg | Freq: Once | INTRAVENOUS | Status: AC
  Administered 2019-11-15: 0.25 mg via INTRAVENOUS

## 2019-11-15 MED ORDER — SODIUM CHLORIDE 0.9% FLUSH
10.0000 mL | Freq: Once | INTRAVENOUS | Status: DC
  Filled 2019-11-15: qty 10

## 2019-11-15 MED ORDER — SODIUM CHLORIDE 0.9 % IV SOLN
10.0000 mg | Freq: Once | INTRAVENOUS | Status: AC
  Administered 2019-11-15: 10 mg via INTRAVENOUS
  Filled 2019-11-15: qty 10

## 2019-11-15 MED ORDER — ALUM & MAG HYDROXIDE-SIMETH 200-200-20 MG/5ML PO SUSP
ORAL | Status: AC
  Filled 2019-11-15: qty 30

## 2019-11-15 MED ORDER — SODIUM CHLORIDE 0.9 % IV SOLN
2400.0000 mg/m2 | INTRAVENOUS | Status: DC
  Administered 2019-11-15: 4300 mg via INTRAVENOUS
  Filled 2019-11-15: qty 86

## 2019-11-15 MED ORDER — ATROPINE SULFATE 1 MG/ML IJ SOLN
0.5000 mg | Freq: Once | INTRAMUSCULAR | Status: AC | PRN
  Administered 2019-11-15: 0.5 mg via INTRAVENOUS

## 2019-11-15 MED ORDER — ATROPINE SULFATE 1 MG/ML IJ SOLN
INTRAMUSCULAR | Status: AC
  Filled 2019-11-15: qty 1

## 2019-11-15 MED ORDER — SODIUM CHLORIDE 0.9 % IV SOLN
400.0000 mg/m2 | Freq: Once | INTRAVENOUS | Status: AC
  Administered 2019-11-15: 716 mg via INTRAVENOUS
  Filled 2019-11-15: qty 35.8

## 2019-11-15 MED ORDER — ALUM & MAG HYDROXIDE-SIMETH 200-200-20 MG/5ML PO SUSP
30.0000 mL | Freq: Once | ORAL | Status: AC
  Administered 2019-11-15: 30 mL via ORAL

## 2019-11-15 MED ORDER — FLUOROURACIL CHEMO INJECTION 2.5 GM/50ML
400.0000 mg/m2 | Freq: Once | INTRAVENOUS | Status: AC
  Administered 2019-11-15: 700 mg via INTRAVENOUS
  Filled 2019-11-15: qty 14

## 2019-11-15 MED ORDER — PALONOSETRON HCL INJECTION 0.25 MG/5ML
INTRAVENOUS | Status: AC
  Filled 2019-11-15: qty 5

## 2019-11-15 MED ORDER — SODIUM CHLORIDE 0.9 % IV SOLN
5.0000 mg/kg | Freq: Once | INTRAVENOUS | Status: AC
  Administered 2019-11-15: 300 mg via INTRAVENOUS
  Filled 2019-11-15: qty 12

## 2019-11-15 MED ORDER — SODIUM CHLORIDE 0.9 % IV SOLN
Freq: Once | INTRAVENOUS | Status: AC
  Filled 2019-11-15: qty 250

## 2019-11-15 MED ORDER — SODIUM CHLORIDE 0.9% FLUSH
10.0000 mL | INTRAVENOUS | Status: DC | PRN
  Administered 2019-11-15: 10 mL
  Filled 2019-11-15: qty 10

## 2019-11-15 NOTE — Progress Notes (Signed)
Pt diarrhea along with runnny nose and watery eyes. Infusion paused and atroprine given. Pt reports that his symptoms improved w/ atropine

## 2019-11-15 NOTE — Progress Notes (Signed)
[place] OFFICE PROGRESS NOTE   Diagnosis: Colon cancer  INTERVAL HISTORY:   Alexander Reeves returns as scheduled.  He completed cycle 1 FOLFIRI/Avastin 11/01/2019.  During Avastin and irinotecan he complained of tingling over his body and nausea.  He was given Pepcid.  Symptoms improved.  Infusions were completed.  He denies nausea/vomiting following chemotherapy.  No mouth sores.  No diarrhea.  He denies bleeding.  Recently the left ear has felt full and he has had a sore throat.  No fever.  He feels warm at times.  No significant cough.  No shortness of breath.  He reports several people smoke inside his residence.  Objective:  Vital signs in last 24 hours:  Blood pressure 133/88, pulse 98, temperature 97.6 F (36.4 C), temperature source Tympanic, resp. rate 16, height '5\' 10"'  (1.778 m), weight 145 lb 12.8 oz (66.1 kg), SpO2 100 %.    HEENT: No thrush or ulcers.  Bilateral ear canal/tympanic membrane unremarkable. Resp: Lungs clear bilaterally. Cardio: Regular rate and rhythm. GI: Abdomen soft and nontender.  No hepatomegaly. Vascular: No leg edema. Skin: Palms without erythema. Port-A-Cath without erythema.   Lab Results:  Lab Results  Component Value Date   WBC 7.8 11/15/2019   HGB 11.9 (L) 11/15/2019   HCT 37.6 (L) 11/15/2019   MCV 84.7 11/15/2019   PLT 185 11/15/2019   NEUTROABS 5.2 11/15/2019    Imaging:  No results found.  Medications: I have reviewed the patient's current medications.  Assessment/Plan: 1. Moderately differentiated adenocarcinoma ascending colon, stage IIIb (pT3pN1), status post a right colectomy 11/19/2018 ? Lymphovascular and perineural invasion present, 2/14 lymph nodes positive, tumor deposits present ? Positive radial margin, no loss of mismatch repair protein expression, discussed case with Dr. Craig Reeves mass with surrounding inflammation at multiple margins, no gross residual disease, unclear which is the  "positive "radial margin, further surgery and radiation not recommended ? Colonoscopy 11/19/2018-completely obstructing mid ascending colon mass, could not be passed with endoscope, biopsy confirmed invasive adenocarcinoma ? CT abdomen/pelvis 11/18/2018-wall thickening at the mid and distal ascending colon with mild distention of the proximal ascending colon and cecum ? CTs 10/17/2018--no acute findings, no chest lymphadenopathy, lungs clear ? Cycle 1 FOLFOX 12/28/2018 ? Cycle 2 FOLFOX 01/11/2019, Udenyca added ? Cycle 3 FOLFOX 01/25/2019,Udenyca held due to bone pain ? Cycle 4 FOLFOX 02/21/2019, Udenyca added ? Cycle 5 FOLFOX 03/09/2019, Udenyca ? Cycle 6 FOLFOX 03/21/2019, Udenyca ? Cycle 7 FOLFOX 04/04/2019, Udenyca ? Cycle 8 FOLFOX 04/18/2019, Udenyca ? Cycle 9 FOLFOX 05/02/2019, Udenyca ? Cycle 10 FOLFOX 05/16/2019, oxaliplatin, 5-FU bolus, and Udenyca held ? Cycle 11 FOLFOX 05/30/2019, oxaliplatin, 5-FU bolus, and Udenyca held ? Cycle 12 FOLFOX 06/15/2019, oxaliplatin, 5-FU bolus and Udenyca held ? CTs 09/29/2019-new hypodense enhancing liver masses consistent with metastatic disease, indistinct marginated nodularity below the pancreas head-likely small lymph nodes ? Biopsy liver lesion 10/18/2019-adenocarcinoma consistent with history of colorectal carcinoma ? Cycle 1 FOLFIRI/Avastin 11/01/2019 ? Cycle 2 FOLFIRI/Avastin 11/15/2019  2. Deaf 3. Right epididymal cyst removal 03/22/2018 4. Asthma 5. Port-A-Cath placement, Dr. Donne Reeves, 12/23/2018 6. Neutropenia secondary to chemotherapy-Udenyca added for cycle 2 FOLFOX 7. Admission with febrile neutropenia 02/08/2019  Disposition: Alexander Reeves appears stable.  He has completed 1 cycle of FOLFIRI/Avastin.  Plan to proceed with cycle 2 today as scheduled.  We reviewed the CBC and chemistry panel from today.  Labs adequate to proceed with treatment.  He likely has a viral upper respiratory infection.  He will treat symptomatically.  He understands to  contact the office with worsening of current symptoms, fever, chills, cough.  He will return for lab, follow-up, cycle 3 FOLFIRI/Avastin in 2 weeks.  He will contact the office in the interim as outlined above or with any other problems.  Ned Card ANP/GNP-BC   11/15/2019  11:47 AM

## 2019-11-15 NOTE — Patient Instructions (Signed)
[place] Discharge Instructions for Patients Receiving Chemotherapy  Today you received the following chemotherapy agents: Avastin, Leucovorin, Irinotecan, and 5FU.   To help prevent nausea and vomiting after your treatment, we encourage you to take your nausea medication as directed.    If you develop nausea and vomiting that is not controlled by your nausea medication, call the clinic.   BELOW ARE SYMPTOMS THAT SHOULD BE REPORTED IMMEDIATELY:  *FEVER GREATER THAN 100.5 F  *CHILLS WITH OR WITHOUT FEVER  NAUSEA AND VOMITING THAT IS NOT CONTROLLED WITH YOUR NAUSEA MEDICATION  *UNUSUAL SHORTNESS OF BREATH  *UNUSUAL BRUISING OR BLEEDING  TENDERNESS IN MOUTH AND THROAT WITH OR WITHOUT PRESENCE OF ULCERS  *URINARY PROBLEMS  *BOWEL PROBLEMS  UNUSUAL RASH Items with * indicate a potential emergency and should be followed up as soon as possible.  Feel free to call the clinic should you have any questions or concerns. The clinic phone number is [phone].  Please show the [place] at check-in to the Emergency Department and triage nurse.

## 2019-11-15 NOTE — Progress Notes (Signed)
During infusion, patient also c/o heartburn. Sandi Mealy, PA-C notified. Medication orders received, repeated, and confirmed. Patient medicated as documented in [facility]. Before the end of treatment, patient verbalized complete relief of symptoms.

## 2019-11-16 ENCOUNTER — Other Ambulatory Visit: Payer: Self-pay | Admitting: *Deleted

## 2019-11-16 ENCOUNTER — Telehealth: Payer: Self-pay | Admitting: Oncology

## 2019-11-16 ENCOUNTER — Telehealth: Payer: Self-pay | Admitting: *Deleted

## 2019-11-16 MED ORDER — PANTOPRAZOLE SODIUM 40 MG PO TBEC
40.0000 mg | DELAYED_RELEASE_TABLET | Freq: Every day | ORAL | 3 refills | Status: DC
Start: 2019-11-16 — End: 2020-03-01

## 2019-11-16 NOTE — Progress Notes (Signed)
[facility] to cancel refills on his pantoprazole since he is transferring this script to [facility].

## 2019-11-16 NOTE — Telephone Encounter (Signed)
Scheduled appointments per 11/2 los. Will have updated calendar printed for patient.

## 2019-11-16 NOTE — Telephone Encounter (Signed)
Error

## 2019-11-17 ENCOUNTER — Other Ambulatory Visit: Payer: Self-pay

## 2019-11-17 ENCOUNTER — Inpatient Hospital Stay: Payer: Medicare Other

## 2019-11-17 VITALS — BP 118/81 | HR 71 | Resp 18

## 2019-11-17 DIAGNOSIS — C189 Malignant neoplasm of colon, unspecified: Secondary | ICD-10-CM

## 2019-11-17 DIAGNOSIS — Z5112 Encounter for antineoplastic immunotherapy: Secondary | ICD-10-CM | POA: Diagnosis not present

## 2019-11-17 DIAGNOSIS — D701 Agranulocytosis secondary to cancer chemotherapy: Secondary | ICD-10-CM | POA: Diagnosis not present

## 2019-11-17 DIAGNOSIS — Z5189 Encounter for other specified aftercare: Secondary | ICD-10-CM | POA: Diagnosis not present

## 2019-11-17 DIAGNOSIS — C182 Malignant neoplasm of ascending colon: Secondary | ICD-10-CM

## 2019-11-17 DIAGNOSIS — Z5111 Encounter for antineoplastic chemotherapy: Secondary | ICD-10-CM | POA: Diagnosis not present

## 2019-11-17 DIAGNOSIS — Z79899 Other long term (current) drug therapy: Secondary | ICD-10-CM | POA: Diagnosis not present

## 2019-11-17 MED ORDER — SODIUM CHLORIDE 0.9% FLUSH
10.0000 mL | INTRAVENOUS | Status: DC | PRN
  Administered 2019-11-17: 10 mL
  Filled 2019-11-17: qty 10

## 2019-11-17 MED ORDER — PEGFILGRASTIM-CBQV 6 MG/0.6ML ~~LOC~~ SOSY
PREFILLED_SYRINGE | SUBCUTANEOUS | Status: AC
  Filled 2019-11-17: qty 0.6

## 2019-11-17 MED ORDER — HEPARIN SOD (PORK) LOCK FLUSH 100 UNIT/ML IV SOLN
500.0000 [IU] | Freq: Once | INTRAVENOUS | Status: AC | PRN
  Administered 2019-11-17: 500 [IU]
  Filled 2019-11-17: qty 5

## 2019-11-17 MED ORDER — PEGFILGRASTIM-CBQV 6 MG/0.6ML ~~LOC~~ SOSY
6.0000 mg | PREFILLED_SYRINGE | Freq: Once | SUBCUTANEOUS | Status: AC
  Administered 2019-11-17: 6 mg via SUBCUTANEOUS

## 2019-11-17 NOTE — Patient Instructions (Signed)
Pegfilgrastim injection What is this medicine? PEGFILGRASTIM (PEG fil gra stim) is a long-acting granulocyte colony-stimulating factor that stimulates the growth of neutrophils, a type of white blood cell important in the body's fight against infection. It is used to reduce the incidence of fever and infection in patients with certain types of cancer who are receiving chemotherapy that affects the bone marrow, and to increase survival after being exposed to high doses of radiation. This medicine may be used for other purposes; ask your health care provider or pharmacist if you have questions. COMMON BRAND NAME(S): Fulphila, Neulasta, UDENYCA, Ziextenzo What should I tell my health care provider before I take this medicine? They need to know if you have any of these conditions:  kidney disease  latex allergy  ongoing radiation therapy  sickle cell disease  skin reactions to acrylic adhesives (On-Body Injector only)  an unusual or allergic reaction to pegfilgrastim, filgrastim, other medicines, foods, dyes, or preservatives  pregnant or trying to get pregnant  breast-feeding How should I use this medicine? This medicine is for injection under the skin. If you get this medicine at home, you will be taught how to prepare and give the pre-filled syringe or how to use the On-body Injector. Refer to the patient Instructions for Use for detailed instructions. Use exactly as directed. Tell your healthcare provider immediately if you suspect that the On-body Injector may not have performed as intended or if you suspect the use of the On-body Injector resulted in a missed or partial dose. It is important that you put your used needles and syringes in a special sharps container. Do not put them in a trash can. If you do not have a sharps container, call your pharmacist or healthcare provider to get one. Talk to your pediatrician regarding the use of this medicine in children. While this drug may be  prescribed for selected conditions, precautions do apply. Overdosage: If you think you have taken too much of this medicine contact a poison control center or emergency room at once. NOTE: This medicine is only for you. Do not share this medicine with others. What if I miss a dose? It is important not to miss your dose. Call your doctor or health care professional if you miss your dose. If you miss a dose due to an On-body Injector failure or leakage, a new dose should be administered as soon as possible using a single prefilled syringe for manual use. What may interact with this medicine? Interactions have not been studied. Give your health care provider a list of all the medicines, herbs, non-prescription drugs, or dietary supplements you use. Also tell them if you smoke, drink alcohol, or use illegal drugs. Some items may interact with your medicine. This list may not describe all possible interactions. Give your health care provider a list of all the medicines, herbs, non-prescription drugs, or dietary supplements you use. Also tell them if you smoke, drink alcohol, or use illegal drugs. Some items may interact with your medicine. What should I watch for while using this medicine? You may need blood work done while you are taking this medicine. If you are going to need a MRI, CT scan, or other procedure, tell your doctor that you are using this medicine (On-Body Injector only). What side effects may I notice from receiving this medicine? Side effects that you should report to your doctor or health care professional as soon as possible:  allergic reactions like skin rash, itching or hives, swelling of the   face, lips, or tongue  back pain  dizziness  fever  pain, redness, or irritation at site where injected  pinpoint red spots on the skin  red or dark-brown urine  shortness of breath or breathing problems  stomach or side pain, or pain at the  shoulder  swelling  tiredness  trouble passing urine or change in the amount of urine Side effects that usually do not require medical attention (report to your doctor or health care professional if they continue or are bothersome):  bone pain  muscle pain This list may not describe all possible side effects. Call your doctor for medical advice about side effects. You may report side effects to FDA at 1-800-FDA-1088. Where should I keep my medicine? Keep out of the reach of children. If you are using this medicine at home, you will be instructed on how to store it. Throw away any unused medicine after the expiration date on the label. NOTE: This sheet is a summary. It may not cover all possible information. If you have questions about this medicine, talk to your doctor, pharmacist, or health care provider.  2020 Elsevier/Gold Standard (2017-04-06 16:57:08) Implanted Port Home Guide An implanted port is a device that is placed under the skin. It is usually placed in the chest. The device can be used to give IV medicine, to take blood, or for dialysis. You may have an implanted port if:  You need IV medicine that would be irritating to the small veins in your hands or arms.  You need IV medicines, such as antibiotics, for a long period of time.  You need IV nutrition for a long period of time.  You need dialysis. Having a port means that your health care provider will not need to use the veins in your arms for these procedures. You may have fewer limitations when using a port than you would if you used other types of long-term IVs, and you will likely be able to return to normal activities after your incision heals. An implanted port has two main parts:  Reservoir. The reservoir is the part where a needle is inserted to give medicines or draw blood. The reservoir is round. After it is placed, it appears as a small, raised area under your skin.  Catheter. The catheter is a thin,  flexible tube that connects the reservoir to a vein. Medicine that is inserted into the reservoir goes into the catheter and then into the vein. How is my port accessed? To access your port:  A numbing cream may be placed on the skin over the port site.  Your health care provider will put on a mask and sterile gloves.  The skin over your port will be cleaned carefully with a germ-killing soap and allowed to dry.  Your health care provider will gently pinch the port and insert a needle into it.  Your health care provider will check for a blood return to make sure the port is in the vein and is not clogged.  If your port needs to remain accessed to get medicine continuously (constant infusion), your health care provider will place a clear bandage (dressing) over the needle site. The dressing and needle will need to be changed every week, or as told by your health care provider. What is flushing? Flushing helps keep the port from getting clogged. Follow instructions from your health care provider about how and when to flush the port. Ports are usually flushed with saline solution or a medicine   called heparin. The need for flushing will depend on how the port is used:  If the port is only used from time to time to give medicines or draw blood, the port may need to be flushed: ? Before and after medicines have been given. ? Before and after blood has been drawn. ? As part of routine maintenance. Flushing may be recommended every 4-6 weeks.  If a constant infusion is running, the port may not need to be flushed.  Throw away any syringes in a disposal container that is meant for sharp items (sharps container). You can buy a sharps container from a pharmacy, or you can make one by using an empty hard plastic bottle with a cover. How long will my port stay implanted? The port can stay in for as long as your health care provider thinks it is needed. When it is time for the port to come out, a  surgery will be done to remove it. The surgery will be similar to the procedure that was done to put the port in. Follow these instructions at home:   Flush your port as told by your health care provider.  If you need an infusion over several days, follow instructions from your health care provider about how to take care of your port site. Make sure you: ? Wash your hands with soap and water before you change your dressing. If soap and water are not available, use alcohol-based hand sanitizer. ? Change your dressing as told by your health care provider. ? Place any used dressings or infusion bags into a plastic bag. Throw that bag in the trash. ? Keep the dressing that covers the needle clean and dry. Do not get it wet. ? Do not use scissors or sharp objects near the tube. ? Keep the tube clamped, unless it is being used.  Check your port site every day for signs of infection. Check for: ? Redness, swelling, or pain. ? Fluid or blood. ? Pus or a bad smell.  Protect the skin around the port site. ? Avoid wearing bra straps that rub or irritate the site. ? Protect the skin around your port from seat belts. Place a soft pad over your chest if needed.  Bathe or shower as told by your health care provider. The site may get wet as long as you are not actively receiving an infusion.  Return to your normal activities as told by your health care provider. Ask your health care provider what activities are safe for you.  Carry a medical alert card or wear a medical alert bracelet at all times. This will let health care providers know that you have an implanted port in case of an emergency. Get help right away if:  You have redness, swelling, or pain at the port site.  You have fluid or blood coming from your port site.  You have pus or a bad smell coming from the port site.  You have a fever. Summary  Implanted ports are usually placed in the chest for long-term IV access.  Follow  instructions from your health care provider about flushing the port and changing bandages (dressings).  Take care of the area around your port by avoiding clothing that puts pressure on the area, and by watching for signs of infection.  Protect the skin around your port from seat belts. Place a soft pad over your chest if needed.  Get help right away if you have a fever or you have redness,   swelling, pain, drainage, or a bad smell at the port site. This information is not intended to replace advice given to you by your health care provider. Make sure you discuss any questions you have with your health care provider. Document Revised: 04/23/2018 Document Reviewed: 02/02/2016 Elsevier Patient Education  2020 Elsevier Inc.  

## 2019-11-21 ENCOUNTER — Other Ambulatory Visit: Payer: Self-pay

## 2019-11-21 ENCOUNTER — Encounter: Payer: Self-pay | Admitting: Podiatry

## 2019-11-21 ENCOUNTER — Ambulatory Visit (INDEPENDENT_AMBULATORY_CARE_PROVIDER_SITE_OTHER): Payer: Medicare Other | Admitting: Podiatry

## 2019-11-21 DIAGNOSIS — B351 Tinea unguium: Secondary | ICD-10-CM

## 2019-11-21 DIAGNOSIS — G620 Drug-induced polyneuropathy: Secondary | ICD-10-CM

## 2019-11-21 DIAGNOSIS — T451X5A Adverse effect of antineoplastic and immunosuppressive drugs, initial encounter: Secondary | ICD-10-CM

## 2019-11-21 MED ORDER — CICLOPIROX 8 % EX SOLN: Freq: Every day | CUTANEOUS | 2 refills | Status: DC

## 2019-11-21 MED ORDER — GABAPENTIN 300 MG PO CAPS: 300.0000 mg | ORAL_CAPSULE | Freq: Every day | ORAL | 3 refills | Status: DC

## 2019-11-21 NOTE — Progress Notes (Signed)
  Subjective:  Patient ID: Alexander Reeves., male    DOB: 1965-07-16,  MRN: [id number]  Chief Complaint  Patient presents with  . Foot Pain    "its better, butr still tingles in the forefoot, sensitive with socks and swells at times  . Nail Problem    discuss nail fungus bilat hallux and most toes    54 y.o. male returns with the above complaint. History confirmed with patient.  Still notes discomfort.  He has been using a lidocaine prilocaine cream with some relief.  He also has discoloration of the big toenails that he has been applying an over-the-counter antifungal for.  He is here today with an ASL interpreter in person  Objective:  Physical Exam: warm, good capillary refill, no trophic changes or ulcerative lesions, normal DP and PT pulses.  Mild discoloration with dystrophy of the toenails bilateral hallux Left Foot: Mild tenderness palpation to metatarsal heads 1-3.  No pinpoint pain to suggest sesamoiditis Right Foot: Mild tenderness to palpation to metatarsal heads 1-3.  No pinpoint pain to suggest sesamoiditis   Radiographs: X-ray of both feet: no fracture, dislocation, swelling or degenerative changes noted Assessment:   1. Neuropathy due to chemotherapeutic drug ([place])   2. Onychomycosis      Plan:  Patient was evaluated and treated and all questions answered.   -Continue wearing supportive shoe gear -Continue lidocaine cream as needed -Rx for gabapentin 300 mg sent to his pharmacy for the neuropathy pain.  We reviewed the benefits and potential risks and side effects of this. -Rx for Penlac sent to pharmacy for nail fungus  Return in about 3 months (around 02/21/2020).

## 2019-11-25 ENCOUNTER — Telehealth: Payer: Self-pay | Admitting: Nutrition

## 2019-11-25 NOTE — Telephone Encounter (Signed)
Patient requesting one case of ensure to pick up on Tuesday, Nov 16, when he is here for other appointments. Have provided.

## 2019-11-27 ENCOUNTER — Other Ambulatory Visit: Payer: Self-pay | Admitting: Oncology

## 2019-11-29 ENCOUNTER — Inpatient Hospital Stay (HOSPITAL_BASED_OUTPATIENT_CLINIC_OR_DEPARTMENT_OTHER): Payer: Medicare Other | Admitting: Nurse Practitioner

## 2019-11-29 ENCOUNTER — Encounter: Payer: Self-pay | Admitting: Nurse Practitioner

## 2019-11-29 ENCOUNTER — Inpatient Hospital Stay: Payer: Medicare Other

## 2019-11-29 ENCOUNTER — Other Ambulatory Visit: Payer: Self-pay

## 2019-11-29 VITALS — BP 148/89 | HR 98 | Temp 98.8°F | Resp 17 | Ht 70.0 in | Wt 144.5 lb

## 2019-11-29 DIAGNOSIS — Z79899 Other long term (current) drug therapy: Secondary | ICD-10-CM | POA: Diagnosis not present

## 2019-11-29 DIAGNOSIS — C182 Malignant neoplasm of ascending colon: Secondary | ICD-10-CM | POA: Diagnosis not present

## 2019-11-29 DIAGNOSIS — Z95828 Presence of other vascular implants and grafts: Secondary | ICD-10-CM

## 2019-11-29 DIAGNOSIS — D701 Agranulocytosis secondary to cancer chemotherapy: Secondary | ICD-10-CM | POA: Diagnosis not present

## 2019-11-29 DIAGNOSIS — Z5112 Encounter for antineoplastic immunotherapy: Secondary | ICD-10-CM | POA: Diagnosis not present

## 2019-11-29 DIAGNOSIS — Z5111 Encounter for antineoplastic chemotherapy: Secondary | ICD-10-CM | POA: Diagnosis not present

## 2019-11-29 DIAGNOSIS — Z5189 Encounter for other specified aftercare: Secondary | ICD-10-CM | POA: Diagnosis not present

## 2019-11-29 DIAGNOSIS — C189 Malignant neoplasm of colon, unspecified: Secondary | ICD-10-CM

## 2019-11-29 LAB — CMP (CANCER CENTER ONLY)
ALT: 12 U/L (ref 0–44)
AST: 17 U/L (ref 15–41)
Albumin: 3.9 g/dL (ref 3.5–5.0)
Alkaline Phosphatase: 87 U/L (ref 38–126)
Anion gap: 8 (ref 5–15)
BUN: 15 mg/dL (ref 6–20)
CO2: 26 mmol/L (ref 22–32)
Calcium: 9.3 mg/dL (ref 8.9–10.3)
Chloride: 110 mmol/L (ref 98–111)
Creatinine: 1.35 mg/dL — ABNORMAL HIGH (ref 0.61–1.24)
GFR, Estimated: >60 mL/min (ref >60)
Glucose, Bld: 104 mg/dL — ABNORMAL HIGH (ref 70–99)
Potassium: 4.3 mmol/L (ref 3.5–5.1)
Sodium: 144 mmol/L (ref 135–145)
Total Bilirubin: 0.4 mg/dL (ref 0.3–1.2)
Total Protein: 7 g/dL (ref 6.5–8.1)

## 2019-11-29 LAB — CBC WITH DIFFERENTIAL (CANCER CENTER ONLY)
Abs Immature Granulocytes: 0.49 10*3/uL — ABNORMAL HIGH (ref 0.00–0.07)
Basophils Absolute: 0.1 10*3/uL (ref 0.0–0.1)
Basophils Relative: 1 %
Eosinophils Absolute: 0.2 10*3/uL (ref 0.0–0.5)
Eosinophils Relative: 3 %
HCT: 36.5 % — ABNORMAL LOW (ref 39.0–52.0)
Hemoglobin: 11.3 g/dL — ABNORMAL LOW (ref 13.0–17.0)
Immature Granulocytes: 6 %
Lymphocytes Relative: 18 %
Lymphs Abs: 1.4 10*3/uL (ref 0.7–4.0)
MCH: 26.6 pg (ref 26.0–34.0)
MCHC: 31 g/dL (ref 30.0–36.0)
MCV: 85.9 fL (ref 80.0–100.0)
Monocytes Absolute: 0.5 10*3/uL (ref 0.1–1.0)
Monocytes Relative: 7 %
Neutro Abs: 5 10*3/uL (ref 1.7–7.7)
Neutrophils Relative %: 65 %
Platelet Count: 208 10*3/uL (ref 150–400)
RBC: 4.25 MIL/uL (ref 4.22–5.81)
RDW: 16.7 % — ABNORMAL HIGH (ref 11.5–15.5)
WBC Count: 7.7 10*3/uL (ref 4.0–10.5)
nRBC: 0.6 % — ABNORMAL HIGH (ref 0.0–0.2)

## 2019-11-29 LAB — TOTAL PROTEIN, URINE DIPSTICK: Protein, ur: NEGATIVE mg/dL

## 2019-11-29 MED ORDER — SODIUM CHLORIDE 0.9 % IV SOLN
180.0000 mg/m2 | Freq: Once | INTRAVENOUS | Status: AC
  Administered 2019-11-29: 320 mg via INTRAVENOUS
  Filled 2019-11-29: qty 15

## 2019-11-29 MED ORDER — PALONOSETRON HCL INJECTION 0.25 MG/5ML
INTRAVENOUS | Status: AC
  Filled 2019-11-29: qty 5

## 2019-11-29 MED ORDER — SODIUM CHLORIDE 0.9% FLUSH
10.0000 mL | INTRAVENOUS | Status: DC | PRN
  Administered 2019-11-29: 10 mL
  Filled 2019-11-29: qty 10

## 2019-11-29 MED ORDER — ATROPINE SULFATE 1 MG/ML IJ SOLN
INTRAMUSCULAR | Status: AC
  Filled 2019-11-29: qty 1

## 2019-11-29 MED ORDER — SODIUM CHLORIDE 0.9 % IV SOLN
Freq: Once | INTRAVENOUS | Status: AC
  Filled 2019-11-29: qty 250

## 2019-11-29 MED ORDER — ATROPINE SULFATE 1 MG/ML IJ SOLN
0.5000 mg | Freq: Once | INTRAMUSCULAR | Status: AC | PRN
  Administered 2019-11-29: 0.5 mg via INTRAVENOUS

## 2019-11-29 MED ORDER — FLUOROURACIL CHEMO INJECTION 2.5 GM/50ML
400.0000 mg/m2 | Freq: Once | INTRAVENOUS | Status: AC
  Administered 2019-11-29: 700 mg via INTRAVENOUS
  Filled 2019-11-29: qty 14

## 2019-11-29 MED ORDER — SODIUM CHLORIDE 0.9 % IV SOLN
5.0000 mg/kg | Freq: Once | INTRAVENOUS | Status: AC
  Administered 2019-11-29: 300 mg via INTRAVENOUS
  Filled 2019-11-29: qty 12

## 2019-11-29 MED ORDER — SODIUM CHLORIDE 0.9 % IV SOLN
10.0000 mg | Freq: Once | INTRAVENOUS | Status: AC
  Administered 2019-11-29: 10 mg via INTRAVENOUS
  Filled 2019-11-29: qty 10

## 2019-11-29 MED ORDER — PALONOSETRON HCL INJECTION 0.25 MG/5ML
0.2500 mg | Freq: Once | INTRAVENOUS | Status: AC
  Administered 2019-11-29: 0.25 mg via INTRAVENOUS

## 2019-11-29 MED ORDER — SODIUM CHLORIDE 0.9 % IV SOLN
2400.0000 mg/m2 | INTRAVENOUS | Status: DC
  Administered 2019-11-29: 4300 mg via INTRAVENOUS
  Filled 2019-11-29: qty 86

## 2019-11-29 MED ORDER — SODIUM CHLORIDE 0.9 % IV SOLN
400.0000 mg/m2 | Freq: Once | INTRAVENOUS | Status: AC
  Administered 2019-11-29: 716 mg via INTRAVENOUS
  Filled 2019-11-29: qty 35.8

## 2019-11-29 MED ORDER — SODIUM CHLORIDE 0.9% FLUSH
10.0000 mL | INTRAVENOUS | Status: DC | PRN
  Administered 2019-11-29: 3 mL
  Filled 2019-11-29: qty 10

## 2019-11-29 NOTE — Progress Notes (Signed)
  [place] OFFICE PROGRESS NOTE   Diagnosis: Colon cancer  INTERVAL HISTORY:   Alexander Reeves returns as scheduled.  He completed cycle 2 FOLFIRI/Avastin 11/15/2019.  He denies nausea/vomiting.  No mouth sores.  He has occasional diarrhea.  He has a good appetite.  He has occasional bleeding related to hemorrhoids.  No other bleeding.  Objective:  Vital signs in last 24 hours:  Blood pressure (!) 148/89, pulse 98, temperature 98.8 F (37.1 C), temperature source Tympanic, resp. rate 17, height $RemoveBe'5\' 10"'wdsMbRmhq$  (1.778 m), weight 144 lb 8 oz (65.5 kg), SpO2 98 %.    HEENT: No thrush or ulcers. Resp: Lungs clear bilaterally. Cardio: Regular rate and rhythm. GI: No hepatomegaly. Vascular: No leg edema.  Skin: Palms with hyperpigmentation, dry appearing. Port-A-Cath without erythema.   Lab Results:  Lab Results  Component Value Date   WBC 7.7 11/29/2019   HGB 11.3 (L) 11/29/2019   HCT 36.5 (L) 11/29/2019   MCV 85.9 11/29/2019   PLT 208 11/29/2019   NEUTROABS 5.0 11/29/2019    Imaging:  No results found.  Medications: I have reviewed the patient's current medications.  Assessment/Plan: 1.Moderately differentiated adenocarcinoma ascending colon, stage IIIb (pT3pN1), status post a right colectomy 11/19/2018 ? Lymphovascular and perineural invasion present, 2/14 lymph nodes positive, tumor deposits present ? Positive radial margin, no loss of mismatch repair protein expression, discussed case with Alexander Reeves mass with surrounding inflammation at multiple margins, no gross residual disease, unclear which is the "positive "radial margin, further surgery and radiation not recommended ? Colonoscopy 11/19/2018-completely obstructing mid ascending colon mass, could not be passed with endoscope, biopsy confirmed invasive adenocarcinoma ? CT abdomen/pelvis 11/18/2018-wall thickening at the mid and distal ascending colon with mild distention of the proximal ascending colon  and cecum ? CTs 10/17/2018--no acute findings, no chest lymphadenopathy, lungs clear ? Cycle 1 FOLFOX 12/28/2018 ? Cycle 2 FOLFOX 01/11/2019, Udenyca added ? Cycle 3 FOLFOX 01/25/2019,Udenyca held due to bone pain ? Cycle 4 FOLFOX 02/21/2019, Udenyca added ? Cycle 5 FOLFOX 03/09/2019, Udenyca ? Cycle 6 FOLFOX 03/21/2019, Udenyca ? Cycle 7 FOLFOX 04/04/2019, Udenyca ? Cycle 8 FOLFOX 04/18/2019, Udenyca ? Cycle 9 FOLFOX 05/02/2019, Udenyca ? Cycle 10 FOLFOX 05/16/2019, oxaliplatin, 5-FU bolus, and Udenyca held ? Cycle 11 FOLFOX 05/30/2019, oxaliplatin, 5-FU bolus, and Udenyca held ? Cycle 12 FOLFOX 06/15/2019, oxaliplatin, 5-FU bolus and Udenyca held ? CTs 09/29/2019-new hypodense enhancing liver masses consistent with metastatic disease, indistinct marginated nodularity below the pancreas head-likely small lymph nodes ? Biopsy liver lesion 10/18/2019-adenocarcinoma consistent with history of colorectal carcinoma ? Cycle 1 FOLFIRI/Avastin 11/01/2019 ? Cycle 2 FOLFIRI/Avastin 11/15/2019 ? Cycle 3 FOLFIRI/Avastin 11/29/2019  2. Deaf 3. Right epididymal cyst removal 03/22/2018 4. Asthma 5. Port-A-Cath placement, Alexander Reeves, 12/23/2018 6. Neutropenia secondary to chemotherapy-Udenyca added for cycle 2 FOLFOX 7. Admission with febrile neutropenia 02/08/2019  Disposition: Alexander Reeves appears stable.  He has completed 2 cycles of FOLFIRI/Avastin.  He is tolerating chemotherapy well.  Plan to proceed with cycle 3 today as scheduled.  Restaging CTs after cycle 5.  We reviewed the CBC from today.  Counts adequate to proceed with treatment.  He will return for lab, follow-up, cycle 4 FOLFIRI/Avastin in 2 weeks.  He will contact the office in the interim with any problems.    Alexander Reeves ANP/GNP-BC   11/29/2019  11:43 AM

## 2019-11-29 NOTE — Patient Instructions (Signed)
[facility] Discharge Instructions for Patients Receiving Chemotherapy  Today you received the following chemotherapy agents: Avastin, Leucovorin, Irinotecan, and 5FU.   To help prevent nausea and vomiting after your treatment, we encourage you to take your nausea medication as directed.    If you develop nausea and vomiting that is not controlled by your nausea medication, call the clinic.   BELOW ARE SYMPTOMS THAT SHOULD BE REPORTED IMMEDIATELY:  *FEVER GREATER THAN 100.5 F  *CHILLS WITH OR WITHOUT FEVER  NAUSEA AND VOMITING THAT IS NOT CONTROLLED WITH YOUR NAUSEA MEDICATION  *UNUSUAL SHORTNESS OF BREATH  *UNUSUAL BRUISING OR BLEEDING  TENDERNESS IN MOUTH AND THROAT WITH OR WITHOUT PRESENCE OF ULCERS  *URINARY PROBLEMS  *BOWEL PROBLEMS  UNUSUAL RASH Items with * indicate a potential emergency and should be followed up as soon as possible.  Feel free to call the clinic should you have any questions or concerns. The clinic phone number is [phone].  Please show the [facility] at check-in to the Emergency Department and triage nurse.

## 2019-11-29 NOTE — Patient Instructions (Signed)

## 2019-11-30 ENCOUNTER — Telehealth: Payer: Self-pay | Admitting: Nurse Practitioner

## 2019-11-30 NOTE — Telephone Encounter (Signed)
Scheduled appointments per 11/16 los. Will have updated calendar printed for patient at next visit.

## 2019-12-01 ENCOUNTER — Telehealth: Payer: Self-pay | Admitting: *Deleted

## 2019-12-01 ENCOUNTER — Inpatient Hospital Stay: Payer: Medicare Other

## 2019-12-01 ENCOUNTER — Other Ambulatory Visit: Payer: Self-pay

## 2019-12-01 VITALS — BP 113/78 | HR 86 | Temp 98.2°F | Resp 18

## 2019-12-01 DIAGNOSIS — Z5112 Encounter for antineoplastic immunotherapy: Secondary | ICD-10-CM | POA: Diagnosis not present

## 2019-12-01 DIAGNOSIS — C182 Malignant neoplasm of ascending colon: Secondary | ICD-10-CM | POA: Diagnosis not present

## 2019-12-01 DIAGNOSIS — D701 Agranulocytosis secondary to cancer chemotherapy: Secondary | ICD-10-CM | POA: Diagnosis not present

## 2019-12-01 DIAGNOSIS — Z5111 Encounter for antineoplastic chemotherapy: Secondary | ICD-10-CM | POA: Diagnosis not present

## 2019-12-01 DIAGNOSIS — Z79899 Other long term (current) drug therapy: Secondary | ICD-10-CM | POA: Diagnosis not present

## 2019-12-01 DIAGNOSIS — Z5189 Encounter for other specified aftercare: Secondary | ICD-10-CM | POA: Diagnosis not present

## 2019-12-01 DIAGNOSIS — C189 Malignant neoplasm of colon, unspecified: Secondary | ICD-10-CM

## 2019-12-01 MED ORDER — HEPARIN SOD (PORK) LOCK FLUSH 100 UNIT/ML IV SOLN
500.0000 [IU] | Freq: Once | INTRAVENOUS | Status: AC | PRN
  Administered 2019-12-01: 500 [IU]
  Filled 2019-12-01: qty 5

## 2019-12-01 MED ORDER — PEGFILGRASTIM-CBQV 6 MG/0.6ML ~~LOC~~ SOSY
6.0000 mg | PREFILLED_SYRINGE | Freq: Once | SUBCUTANEOUS | Status: AC
  Administered 2019-12-01: 6 mg via SUBCUTANEOUS

## 2019-12-01 MED ORDER — PEGFILGRASTIM-CBQV 6 MG/0.6ML ~~LOC~~ SOSY
PREFILLED_SYRINGE | SUBCUTANEOUS | Status: AC
  Filled 2019-12-01: qty 0.6

## 2019-12-01 MED ORDER — SODIUM CHLORIDE 0.9% FLUSH
10.0000 mL | INTRAVENOUS | Status: DC | PRN
  Administered 2019-12-01: 10 mL
  Filled 2019-12-01: qty 10

## 2019-12-01 NOTE — Progress Notes (Signed)
Pt experiencing neck pain that radiates down his whole body. Pain is not bad when sleeping but bothers pt when he is moving. Pain started two weeks ago and is still occurring during visit today.  Merceda Elks RN notified.

## 2019-12-01 NOTE — Telephone Encounter (Signed)
Patient in formed flush nurse at pump d/c that he has been having neck pain for 2 days and for 2 weeks is having a pain that runs up and down his body (did not mention this at 11/16 office visit). Asking for someone to see him. Informed him that this sounds musculoskeletal and he could have slept wrong in bed. Does not sound like anything related to his cancer. Suggested taking Ibuprofen 400 mg bid and using heating pad 3-4/day for 20 minutes each. Try gentle stretches as well. Call back next week if not improved. Information written down for him and he was told w/use of sign language interpreter.

## 2019-12-05 ENCOUNTER — Telehealth: Payer: Self-pay | Admitting: *Deleted

## 2019-12-05 DIAGNOSIS — C189 Malignant neoplasm of colon, unspecified: Secondary | ICD-10-CM

## 2019-12-05 DIAGNOSIS — Z760 Encounter for issue of repeat prescription: Secondary | ICD-10-CM

## 2019-12-05 MED ORDER — ALBUTEROL SULFATE HFA 108 (90 BASE) MCG/ACT IN AERS: 2.0000 | INHALATION_SPRAY | RESPIRATORY_TRACT | 3 refills | Status: DC | PRN

## 2019-12-05 NOTE — Telephone Encounter (Signed)
Called to request refill on albuteral MDI and wants it sent to [facility] now.

## 2019-12-10 ENCOUNTER — Other Ambulatory Visit: Payer: Self-pay | Admitting: Oncology

## 2019-12-13 ENCOUNTER — Other Ambulatory Visit: Payer: Self-pay

## 2019-12-13 ENCOUNTER — Encounter: Payer: Self-pay | Admitting: Nurse Practitioner

## 2019-12-13 ENCOUNTER — Inpatient Hospital Stay: Payer: Medicare Other

## 2019-12-13 ENCOUNTER — Inpatient Hospital Stay (HOSPITAL_BASED_OUTPATIENT_CLINIC_OR_DEPARTMENT_OTHER): Payer: Medicare Other | Admitting: Nurse Practitioner

## 2019-12-13 VITALS — BP 118/80 | HR 101 | Temp 98.4°F | Resp 16 | Ht 70.0 in | Wt 147.4 lb

## 2019-12-13 VITALS — BP 116/78 | HR 80 | Temp 98.0°F | Resp 20

## 2019-12-13 DIAGNOSIS — Z5111 Encounter for antineoplastic chemotherapy: Secondary | ICD-10-CM | POA: Diagnosis not present

## 2019-12-13 DIAGNOSIS — C189 Malignant neoplasm of colon, unspecified: Secondary | ICD-10-CM

## 2019-12-13 DIAGNOSIS — C182 Malignant neoplasm of ascending colon: Secondary | ICD-10-CM

## 2019-12-13 DIAGNOSIS — Z5189 Encounter for other specified aftercare: Secondary | ICD-10-CM | POA: Diagnosis not present

## 2019-12-13 DIAGNOSIS — Z79899 Other long term (current) drug therapy: Secondary | ICD-10-CM | POA: Diagnosis not present

## 2019-12-13 DIAGNOSIS — Z5112 Encounter for antineoplastic immunotherapy: Secondary | ICD-10-CM | POA: Diagnosis not present

## 2019-12-13 DIAGNOSIS — D701 Agranulocytosis secondary to cancer chemotherapy: Secondary | ICD-10-CM | POA: Diagnosis not present

## 2019-12-13 LAB — CBC WITH DIFFERENTIAL (CANCER CENTER ONLY)
Abs Immature Granulocytes: 0.48 10*3/uL — ABNORMAL HIGH (ref 0.00–0.07)
Basophils Absolute: 0.1 10*3/uL (ref 0.0–0.1)
Basophils Relative: 1 %
Eosinophils Absolute: 0.3 10*3/uL (ref 0.0–0.5)
Eosinophils Relative: 3 %
HCT: 36.7 % — ABNORMAL LOW (ref 39.0–52.0)
Hemoglobin: 11.3 g/dL — ABNORMAL LOW (ref 13.0–17.0)
Immature Granulocytes: 6 %
Lymphocytes Relative: 19 %
Lymphs Abs: 1.5 10*3/uL (ref 0.7–4.0)
MCH: 26.3 pg (ref 26.0–34.0)
MCHC: 30.8 g/dL (ref 30.0–36.0)
MCV: 85.3 fL (ref 80.0–100.0)
Monocytes Absolute: 0.7 10*3/uL (ref 0.1–1.0)
Monocytes Relative: 9 %
Neutro Abs: 4.7 10*3/uL (ref 1.7–7.7)
Neutrophils Relative %: 62 %
Platelet Count: 196 10*3/uL (ref 150–400)
RBC: 4.3 MIL/uL (ref 4.22–5.81)
RDW: 17.7 % — ABNORMAL HIGH (ref 11.5–15.5)
WBC Count: 7.7 10*3/uL (ref 4.0–10.5)
nRBC: 0.4 % — ABNORMAL HIGH (ref 0.0–0.2)

## 2019-12-13 LAB — CMP (CANCER CENTER ONLY)
ALT: 20 U/L (ref 0–44)
AST: 17 U/L (ref 15–41)
Albumin: 3.9 g/dL (ref 3.5–5.0)
Alkaline Phosphatase: 74 U/L (ref 38–126)
Anion gap: 10 (ref 5–15)
BUN: 18 mg/dL (ref 6–20)
CO2: 23 mmol/L (ref 22–32)
Calcium: 9.5 mg/dL (ref 8.9–10.3)
Chloride: 107 mmol/L (ref 98–111)
Creatinine: 1.48 mg/dL — ABNORMAL HIGH (ref 0.61–1.24)
GFR, Estimated: 57 mL/min — ABNORMAL LOW (ref >60)
Glucose, Bld: 101 mg/dL — ABNORMAL HIGH (ref 70–99)
Potassium: 4.1 mmol/L (ref 3.5–5.1)
Sodium: 140 mmol/L (ref 135–145)
Total Bilirubin: 0.3 mg/dL (ref 0.3–1.2)
Total Protein: 7.1 g/dL (ref 6.5–8.1)

## 2019-12-13 LAB — TOTAL PROTEIN, URINE DIPSTICK: Protein, ur: NEGATIVE mg/dL

## 2019-12-13 MED ORDER — PALONOSETRON HCL INJECTION 0.25 MG/5ML
0.2500 mg | Freq: Once | INTRAVENOUS | Status: AC
  Administered 2019-12-13: 0.25 mg via INTRAVENOUS

## 2019-12-13 MED ORDER — SODIUM CHLORIDE 0.9 % IV SOLN
2400.0000 mg/m2 | INTRAVENOUS | Status: DC
  Administered 2019-12-13: 4300 mg via INTRAVENOUS
  Filled 2019-12-13: qty 86

## 2019-12-13 MED ORDER — HEPARIN SOD (PORK) LOCK FLUSH 100 UNIT/ML IV SOLN
500.0000 [IU] | Freq: Once | INTRAVENOUS | Status: DC | PRN
  Filled 2019-12-13: qty 5

## 2019-12-13 MED ORDER — ATROPINE SULFATE 1 MG/ML IJ SOLN
0.5000 mg | Freq: Once | INTRAMUSCULAR | Status: AC | PRN
  Administered 2019-12-13: 0.5 mg via INTRAVENOUS

## 2019-12-13 MED ORDER — FLUOROURACIL CHEMO INJECTION 2.5 GM/50ML
400.0000 mg/m2 | Freq: Once | INTRAVENOUS | Status: AC
  Administered 2019-12-13: 700 mg via INTRAVENOUS
  Filled 2019-12-13: qty 14

## 2019-12-13 MED ORDER — SODIUM CHLORIDE 0.9 % IV SOLN
Freq: Once | INTRAVENOUS | Status: AC
  Filled 2019-12-13: qty 250

## 2019-12-13 MED ORDER — PALONOSETRON HCL INJECTION 0.25 MG/5ML
INTRAVENOUS | Status: AC
  Filled 2019-12-13: qty 5

## 2019-12-13 MED ORDER — SODIUM CHLORIDE 0.9% FLUSH
10.0000 mL | INTRAVENOUS | Status: DC | PRN
  Filled 2019-12-13: qty 10

## 2019-12-13 MED ORDER — SODIUM CHLORIDE 0.9 % IV SOLN
400.0000 mg/m2 | Freq: Once | INTRAVENOUS | Status: AC
  Administered 2019-12-13: 716 mg via INTRAVENOUS
  Filled 2019-12-13: qty 35.8

## 2019-12-13 MED ORDER — SODIUM CHLORIDE 0.9 % IV SOLN
5.0000 mg/kg | Freq: Once | INTRAVENOUS | Status: AC
  Administered 2019-12-13: 300 mg via INTRAVENOUS
  Filled 2019-12-13: qty 12

## 2019-12-13 MED ORDER — ATROPINE SULFATE 1 MG/ML IJ SOLN
INTRAMUSCULAR | Status: AC
  Filled 2019-12-13: qty 1

## 2019-12-13 MED ORDER — SODIUM CHLORIDE 0.9 % IV SOLN
10.0000 mg | Freq: Once | INTRAVENOUS | Status: AC
  Administered 2019-12-13: 10 mg via INTRAVENOUS
  Filled 2019-12-13: qty 10

## 2019-12-13 MED ORDER — SODIUM CHLORIDE 0.9 % IV SOLN
180.0000 mg/m2 | Freq: Once | INTRAVENOUS | Status: AC
  Administered 2019-12-13: 320 mg via INTRAVENOUS
  Filled 2019-12-13: qty 15

## 2019-12-13 NOTE — Progress Notes (Signed)
Bevacizumab 300mg  dose rounded down to vial size

## 2019-12-13 NOTE — Progress Notes (Signed)
[place] OFFICE PROGRESS NOTE   Diagnosis: Colon cancer  INTERVAL HISTORY:   Mr. Bolger returns as scheduled.  He completed cycle 3 FOLFIRI/Avastin 11/29/2019.  No significant nausea/vomiting.  No mouth sores.  Mild intermittent loose stools.  Rectal itching which he attributes to hemorrhoids.  Occasionally notes a small amount of blood which he also attributes to hemorrhoids.  No other bleeding.  No fever, cough.  Left-sided throat pain is better.  Objective:  Vital signs in last 24 hours:  Blood pressure 118/80, pulse (!) 101, temperature 98.4 F (36.9 C), temperature source Tympanic, resp. rate 16, height _0  (1.778 m), weight 147 lb 6.4 oz (66.9 kg), SpO2 97 %.    HEENT: No thrush or ulcers. Resp: Lungs clear bilaterally. Cardio: Regular rate and rhythm. GI: Abdomen soft and nontender.  No hepatomegaly. Vascular: No leg edema.  Calves soft and nontender.  Skin: Palms with hyperpigmentation. Port-A-Cath without erythema.   Lab Results:  Lab Results  Component Value Date   WBC 7.7 12/13/2019   HGB 11.3 (L) 12/13/2019   HCT 36.7 (L) 12/13/2019   MCV 85.3 12/13/2019   PLT 196 12/13/2019   NEUTROABS 4.7 12/13/2019    Imaging:  No results found.  Medications: I have reviewed the patient's current medications.  Assessment/Plan: 1.Moderately differentiated adenocarcinoma ascending colon, stage IIIb (pT3pN1), status post a right colectomy 11/19/2018 ? Lymphovascular and perineural invasion present, 2/14 lymph nodes positive, tumor deposits present ? Positive radial margin, no loss of mismatch repair protein expression, discussed case with Dr. Craig Staggers mass with surrounding inflammation at multiple margins, no gross residual disease, unclear which is the "positive "radial margin, further surgery and radiation not recommended ? Colonoscopy 11/19/2018-completely obstructing mid ascending colon mass, could not be passed with endoscope, biopsy  confirmed invasive adenocarcinoma ? CT abdomen/pelvis 11/18/2018-wall thickening at the mid and distal ascending colon with mild distention of the proximal ascending colon and cecum ? CTs 10/17/2018--no acute findings, no chest lymphadenopathy, lungs clear ? Cycle 1 FOLFOX 12/28/2018 ? Cycle 2 FOLFOX 01/11/2019, Udenyca added ? Cycle 3 FOLFOX 01/25/2019,Udenyca held due to bone pain ? Cycle 4 FOLFOX 02/21/2019, Udenyca added ? Cycle 5 FOLFOX 03/09/2019, Udenyca ? Cycle 6 FOLFOX 03/21/2019, Udenyca ? Cycle 7 FOLFOX 04/04/2019, Udenyca ? Cycle 8 FOLFOX 04/18/2019, Udenyca ? Cycle 9 FOLFOX 05/02/2019, Udenyca ? Cycle 10 FOLFOX 05/16/2019, oxaliplatin, 5-FU bolus, and Udenyca held ? Cycle 11 FOLFOX 05/30/2019, oxaliplatin, 5-FU bolus, and Udenyca held ? Cycle 12 FOLFOX 06/15/2019, oxaliplatin, 5-FU bolus and Udenyca held ? CTs 09/29/2019-new hypodense enhancing liver masses consistent with metastatic disease, indistinct marginated nodularity below the pancreas head-likely small lymph nodes ? Biopsy liver lesion 10/18/2019-adenocarcinoma consistent with history of colorectal carcinoma ? Cycle 1 FOLFIRI/Avastin 11/01/2019 ? Cycle 2 FOLFIRI/Avastin 11/15/2019 ? Cycle 3 FOLFIRI/Avastin 11/29/2019 ? Cycle 4 FOLFIRI/Avastin 12/13/2019  2. Deaf 3. Right epididymal cyst removal 03/22/2018 4. Asthma 5. Port-A-Cath placement, [name], 12/23/2018 6. Neutropenia secondary to chemotherapy-Udenyca added for cycle 2 FOLFOX 7. Admission with febrile neutropenia 02/08/2019  Disposition: Mr. Faircloth appears stable.  He has completed 3 cycles of FOLFIRI/Avastin.  Plan to proceed with cycle 4 today as scheduled.  Restaging CTs after completing 5 cycles.  We reviewed the CBC from today.  Counts adequate to proceed with treatment.  He will return for lab, follow-up, cycle 5 FOLFIRI/Avastin in 2 weeks.  He will contact the office in the interim with any problems.    Ned Card ANP/GNP-BC   12/13/2019  10:32  AM        

## 2019-12-13 NOTE — Progress Notes (Signed)
Via sign interpreter, pt. complained of neck spasms "tightening " states it comes and goes. Also states his eyes itch and get teary at times after treatment. Sandi Mealy, PA in for observation and assessment. Warm blanket applied to neck. Per Sandi Mealy, PA, ok to proceed with rest of treatment and to contact us for any issues or concerns. Blood return noted before, during, and after Fluorouracil IV injection. Pt. stable for discharge, left via ambulation, no respiratory distress noted.

## 2019-12-15 ENCOUNTER — Other Ambulatory Visit: Payer: Self-pay

## 2019-12-15 ENCOUNTER — Inpatient Hospital Stay: Payer: Medicare Other | Attending: Nurse Practitioner

## 2019-12-15 VITALS — BP 109/70 | HR 93 | Temp 97.7°F | Resp 16

## 2019-12-15 DIAGNOSIS — D701 Agranulocytosis secondary to cancer chemotherapy: Secondary | ICD-10-CM | POA: Insufficient documentation

## 2019-12-15 DIAGNOSIS — C779 Secondary and unspecified malignant neoplasm of lymph node, unspecified: Secondary | ICD-10-CM | POA: Insufficient documentation

## 2019-12-15 DIAGNOSIS — Z79899 Other long term (current) drug therapy: Secondary | ICD-10-CM | POA: Diagnosis not present

## 2019-12-15 DIAGNOSIS — Z5111 Encounter for antineoplastic chemotherapy: Secondary | ICD-10-CM | POA: Diagnosis not present

## 2019-12-15 DIAGNOSIS — C787 Secondary malignant neoplasm of liver and intrahepatic bile duct: Secondary | ICD-10-CM | POA: Insufficient documentation

## 2019-12-15 DIAGNOSIS — C182 Malignant neoplasm of ascending colon: Secondary | ICD-10-CM | POA: Insufficient documentation

## 2019-12-15 DIAGNOSIS — C189 Malignant neoplasm of colon, unspecified: Secondary | ICD-10-CM

## 2019-12-15 MED ORDER — PEGFILGRASTIM-CBQV 6 MG/0.6ML ~~LOC~~ SOSY
PREFILLED_SYRINGE | SUBCUTANEOUS | Status: AC
  Filled 2019-12-15: qty 0.6

## 2019-12-15 MED ORDER — HEPARIN SOD (PORK) LOCK FLUSH 100 UNIT/ML IV SOLN
500.0000 [IU] | Freq: Once | INTRAVENOUS | Status: AC | PRN
  Administered 2019-12-15: 500 [IU]
  Filled 2019-12-15: qty 5

## 2019-12-15 MED ORDER — SODIUM CHLORIDE 0.9% FLUSH
10.0000 mL | INTRAVENOUS | Status: DC | PRN
  Administered 2019-12-15: 10 mL
  Filled 2019-12-15: qty 10

## 2019-12-15 MED ORDER — PEGFILGRASTIM-CBQV 6 MG/0.6ML ~~LOC~~ SOSY
6.0000 mg | PREFILLED_SYRINGE | Freq: Once | SUBCUTANEOUS | Status: AC
  Administered 2019-12-15: 6 mg via SUBCUTANEOUS

## 2019-12-15 NOTE — Patient Instructions (Signed)

## 2019-12-15 NOTE — Progress Notes (Signed)
Patient c/o 2 day h/o nausea/vomiting. States he feels like food is getting stuck in his chest. Feels like symptoms are better today. Informed patient I would have our [facility] PA see him while here today. Patient declined. Encouraged patient to call clinic if symptoms return and/or worsen. Patient discharged in stable condition.

## 2019-12-25 ENCOUNTER — Other Ambulatory Visit: Payer: Self-pay | Admitting: Oncology

## 2019-12-27 ENCOUNTER — Encounter (HOSPITAL_COMMUNITY): Payer: Self-pay

## 2019-12-27 ENCOUNTER — Other Ambulatory Visit: Payer: Self-pay | Admitting: Medical

## 2019-12-27 ENCOUNTER — Encounter: Payer: Self-pay | Admitting: Oncology

## 2019-12-27 ENCOUNTER — Inpatient Hospital Stay (HOSPITAL_COMMUNITY)
Admission: EM | Admit: 2019-12-27 | Discharge: 2019-12-30 | DRG: 175 | Disposition: A | Discharge status: Home / Self Care | Payer: Medicare Other | Attending: Internal Medicine | Admitting: Internal Medicine

## 2019-12-27 ENCOUNTER — Ambulatory Visit (HOSPITAL_COMMUNITY)
Admission: RE | Admit: 2019-12-27 | Discharge: 2019-12-27 | Disposition: A | Discharge status: Home / Self Care | Payer: Medicare Other | Source: Ambulatory Visit | Attending: Oncology | Admitting: Oncology

## 2019-12-27 ENCOUNTER — Emergency Department (HOSPITAL_COMMUNITY): Payer: Medicare Other

## 2019-12-27 ENCOUNTER — Inpatient Hospital Stay: Payer: Medicare Other

## 2019-12-27 ENCOUNTER — Ambulatory Visit (HOSPITAL_BASED_OUTPATIENT_CLINIC_OR_DEPARTMENT_OTHER)
Admission: RE | Admit: 2019-12-27 | Discharge: 2019-12-27 | Disposition: A | Discharge status: Home / Self Care | Payer: Medicare Other | Source: Ambulatory Visit | Attending: Oncology | Admitting: Oncology

## 2019-12-27 ENCOUNTER — Encounter: Payer: Self-pay | Admitting: *Deleted

## 2019-12-27 ENCOUNTER — Other Ambulatory Visit: Payer: Self-pay

## 2019-12-27 ENCOUNTER — Inpatient Hospital Stay (HOSPITAL_BASED_OUTPATIENT_CLINIC_OR_DEPARTMENT_OTHER): Payer: Medicare Other | Admitting: Oncology

## 2019-12-27 VITALS — BP 112/93 | HR 116 | Temp 99.6°F | Resp 19 | Ht 70.0 in | Wt 140.1 lb

## 2019-12-27 DIAGNOSIS — C189 Malignant neoplasm of colon, unspecified: Secondary | ICD-10-CM | POA: Diagnosis present

## 2019-12-27 DIAGNOSIS — Z7951 Long term (current) use of inhaled steroids: Secondary | ICD-10-CM

## 2019-12-27 DIAGNOSIS — Z91012 Allergy to eggs: Secondary | ICD-10-CM

## 2019-12-27 DIAGNOSIS — Z23 Encounter for immunization: Secondary | ICD-10-CM | POA: Diagnosis not present

## 2019-12-27 DIAGNOSIS — R778 Other specified abnormalities of plasma proteins: Secondary | ICD-10-CM | POA: Diagnosis present

## 2019-12-27 DIAGNOSIS — Z91011 Allergy to milk products: Secondary | ICD-10-CM

## 2019-12-27 DIAGNOSIS — R0602 Shortness of breath: Secondary | ICD-10-CM

## 2019-12-27 DIAGNOSIS — I2699 Other pulmonary embolism without acute cor pulmonale: Secondary | ICD-10-CM | POA: Diagnosis present

## 2019-12-27 DIAGNOSIS — I82409 Acute embolism and thrombosis of unspecified deep veins of unspecified lower extremity: Secondary | ICD-10-CM | POA: Diagnosis present

## 2019-12-27 DIAGNOSIS — Z9852 Vasectomy status: Secondary | ICD-10-CM

## 2019-12-27 DIAGNOSIS — C787 Secondary malignant neoplasm of liver and intrahepatic bile duct: Secondary | ICD-10-CM | POA: Diagnosis present

## 2019-12-27 DIAGNOSIS — R000 Tachycardia, unspecified: Secondary | ICD-10-CM | POA: Diagnosis not present

## 2019-12-27 DIAGNOSIS — M79601 Pain in right arm: Secondary | ICD-10-CM

## 2019-12-27 DIAGNOSIS — Z87891 Personal history of nicotine dependence: Secondary | ICD-10-CM

## 2019-12-27 DIAGNOSIS — I2609 Other pulmonary embolism with acute cor pulmonale: Secondary | ICD-10-CM | POA: Diagnosis not present

## 2019-12-27 DIAGNOSIS — Z880 Allergy status to penicillin: Secondary | ICD-10-CM

## 2019-12-27 DIAGNOSIS — H9190 Unspecified hearing loss, unspecified ear: Secondary | ICD-10-CM | POA: Diagnosis present

## 2019-12-27 DIAGNOSIS — C779 Secondary and unspecified malignant neoplasm of lymph node, unspecified: Secondary | ICD-10-CM | POA: Diagnosis not present

## 2019-12-27 DIAGNOSIS — R0789 Other chest pain: Secondary | ICD-10-CM

## 2019-12-27 DIAGNOSIS — C182 Malignant neoplasm of ascending colon: Secondary | ICD-10-CM | POA: Insufficient documentation

## 2019-12-27 DIAGNOSIS — Z86010 Personal history of colonic polyps: Secondary | ICD-10-CM

## 2019-12-27 DIAGNOSIS — R0600 Dyspnea, unspecified: Secondary | ICD-10-CM | POA: Diagnosis not present

## 2019-12-27 DIAGNOSIS — Z20822 Contact with and (suspected) exposure to covid-19: Secondary | ICD-10-CM | POA: Diagnosis not present

## 2019-12-27 DIAGNOSIS — D701 Agranulocytosis secondary to cancer chemotherapy: Secondary | ICD-10-CM | POA: Diagnosis present

## 2019-12-27 DIAGNOSIS — J45909 Unspecified asthma, uncomplicated: Secondary | ICD-10-CM | POA: Diagnosis not present

## 2019-12-27 DIAGNOSIS — Z5111 Encounter for antineoplastic chemotherapy: Secondary | ICD-10-CM | POA: Diagnosis present

## 2019-12-27 DIAGNOSIS — K219 Gastro-esophageal reflux disease without esophagitis: Secondary | ICD-10-CM | POA: Diagnosis present

## 2019-12-27 DIAGNOSIS — Z6820 Body mass index (BMI) 20.0-20.9, adult: Secondary | ICD-10-CM

## 2019-12-27 DIAGNOSIS — Z888 Allergy status to other drugs, medicaments and biological substances status: Secondary | ICD-10-CM

## 2019-12-27 DIAGNOSIS — E440 Moderate protein-calorie malnutrition: Secondary | ICD-10-CM | POA: Diagnosis present

## 2019-12-27 DIAGNOSIS — Z79899 Other long term (current) drug therapy: Secondary | ICD-10-CM

## 2019-12-27 DIAGNOSIS — I82432 Acute embolism and thrombosis of left popliteal vein: Secondary | ICD-10-CM | POA: Diagnosis not present

## 2019-12-27 DIAGNOSIS — E079 Disorder of thyroid, unspecified: Secondary | ICD-10-CM | POA: Diagnosis present

## 2019-12-27 DIAGNOSIS — Z8249 Family history of ischemic heart disease and other diseases of the circulatory system: Secondary | ICD-10-CM

## 2019-12-27 DIAGNOSIS — Z9049 Acquired absence of other specified parts of digestive tract: Secondary | ICD-10-CM

## 2019-12-27 DIAGNOSIS — Z95828 Presence of other vascular implants and grafts: Secondary | ICD-10-CM

## 2019-12-27 DIAGNOSIS — I2602 Saddle embolus of pulmonary artery with acute cor pulmonale: Secondary | ICD-10-CM

## 2019-12-27 DIAGNOSIS — I82492 Acute embolism and thrombosis of other specified deep vein of left lower extremity: Secondary | ICD-10-CM | POA: Diagnosis not present

## 2019-12-27 DIAGNOSIS — N1832 Chronic kidney disease, stage 3b: Secondary | ICD-10-CM | POA: Diagnosis present

## 2019-12-27 HISTORY — DX: Other pulmonary embolism without acute cor pulmonale: I26.99

## 2019-12-27 HISTORY — DX: Malignant (primary) neoplasm, unspecified: C80.1

## 2019-12-27 LAB — CBC WITH DIFFERENTIAL (CANCER CENTER ONLY)
Abs Immature Granulocytes: 0.04 10*3/uL (ref 0.00–0.07)
Basophils Absolute: 0.1 10*3/uL (ref 0.0–0.1)
Basophils Relative: 1 %
Eosinophils Absolute: 0.4 10*3/uL (ref 0.0–0.5)
Eosinophils Relative: 5 %
HCT: 37.9 % — ABNORMAL LOW (ref 39.0–52.0)
Hemoglobin: 11.9 g/dL — ABNORMAL LOW (ref 13.0–17.0)
Immature Granulocytes: 1 %
Lymphocytes Relative: 17 %
Lymphs Abs: 1.3 10*3/uL (ref 0.7–4.0)
MCH: 26.6 pg (ref 26.0–34.0)
MCHC: 31.4 g/dL (ref 30.0–36.0)
MCV: 84.8 fL (ref 80.0–100.0)
Monocytes Absolute: 0.7 10*3/uL (ref 0.1–1.0)
Monocytes Relative: 9 %
Neutro Abs: 5.3 10*3/uL (ref 1.7–7.7)
Neutrophils Relative %: 67 %
Platelet Count: 211 10*3/uL (ref 150–400)
RBC: 4.47 MIL/uL (ref 4.22–5.81)
RDW: 17.6 % — ABNORMAL HIGH (ref 11.5–15.5)
WBC Count: 7.8 10*3/uL (ref 4.0–10.5)
nRBC: 0 % (ref 0.0–0.2)

## 2019-12-27 LAB — CBC WITH DIFFERENTIAL/PLATELET
Abs Immature Granulocytes: 0.05 10*3/uL (ref 0.00–0.07)
Basophils Absolute: 0.1 10*3/uL (ref 0.0–0.1)
Basophils Relative: 1 %
Eosinophils Absolute: 0.4 10*3/uL (ref 0.0–0.5)
Eosinophils Relative: 4 %
HCT: 39.5 % (ref 39.0–52.0)
Hemoglobin: 12.1 g/dL — ABNORMAL LOW (ref 13.0–17.0)
Immature Granulocytes: 1 %
Lymphocytes Relative: 12 %
Lymphs Abs: 1.1 10*3/uL (ref 0.7–4.0)
MCH: 26.5 pg (ref 26.0–34.0)
MCHC: 30.6 g/dL (ref 30.0–36.0)
MCV: 86.6 fL (ref 80.0–100.0)
Monocytes Absolute: 0.9 10*3/uL (ref 0.1–1.0)
Monocytes Relative: 10 %
Neutro Abs: 6.8 10*3/uL (ref 1.7–7.7)
Neutrophils Relative %: 72 %
Platelets: 215 10*3/uL (ref 150–400)
RBC: 4.56 MIL/uL (ref 4.22–5.81)
RDW: 18 % — ABNORMAL HIGH (ref 11.5–15.5)
WBC: 9.2 10*3/uL (ref 4.0–10.5)
nRBC: 0 % (ref 0.0–0.2)

## 2019-12-27 LAB — CMP (CANCER CENTER ONLY)
ALT: 16 U/L (ref 0–44)
AST: 17 U/L (ref 15–41)
Albumin: 3.7 g/dL (ref 3.5–5.0)
Alkaline Phosphatase: 84 U/L (ref 38–126)
Anion gap: 5 (ref 5–15)
BUN: 12 mg/dL (ref 6–20)
CO2: 27 mmol/L (ref 22–32)
Calcium: 9.5 mg/dL (ref 8.9–10.3)
Chloride: 108 mmol/L (ref 98–111)
Creatinine: 1.52 mg/dL — ABNORMAL HIGH (ref 0.61–1.24)
GFR, Estimated: 55 mL/min — ABNORMAL LOW (ref >60)
Glucose, Bld: 104 mg/dL — ABNORMAL HIGH (ref 70–99)
Potassium: 4 mmol/L (ref 3.5–5.1)
Sodium: 140 mmol/L (ref 135–145)
Total Bilirubin: 0.5 mg/dL (ref 0.3–1.2)
Total Protein: 7.4 g/dL (ref 6.5–8.1)

## 2019-12-27 LAB — HEPARIN LEVEL (UNFRACTIONATED): Heparin Unfractionated: 0.58 IU/mL (ref 0.30–0.70)

## 2019-12-27 LAB — COMPREHENSIVE METABOLIC PANEL
ALT: 16 U/L (ref 0–44)
AST: 19 U/L (ref 15–41)
Albumin: 3.9 g/dL (ref 3.5–5.0)
Alkaline Phosphatase: 80 U/L (ref 38–126)
Anion gap: 7 (ref 5–15)
BUN: 12 mg/dL (ref 6–20)
CO2: 24 mmol/L (ref 22–32)
Calcium: 8.9 mg/dL (ref 8.9–10.3)
Chloride: 105 mmol/L (ref 98–111)
Creatinine, Ser: 1.33 mg/dL — ABNORMAL HIGH (ref 0.61–1.24)
GFR, Estimated: >60 mL/min (ref >60)
Glucose, Bld: 104 mg/dL — ABNORMAL HIGH (ref 70–99)
Potassium: 4.1 mmol/L (ref 3.5–5.1)
Sodium: 136 mmol/L (ref 135–145)
Total Bilirubin: 0.4 mg/dL (ref 0.3–1.2)
Total Protein: 7.3 g/dL (ref 6.5–8.1)

## 2019-12-27 LAB — APTT: aPTT: 34 seconds (ref 24–36)

## 2019-12-27 LAB — TOTAL PROTEIN, URINE DIPSTICK

## 2019-12-27 LAB — SARS CORONAVIRUS 2 (TAT 6-24 HRS): SARS Coronavirus 2: NEGATIVE

## 2019-12-27 LAB — TROPONIN I (HIGH SENSITIVITY)
Troponin I (High Sensitivity): 311 ng/L (ref <18)
Troponin I (High Sensitivity): 206 ng/L (ref <18)

## 2019-12-27 LAB — PROTIME-INR
INR: 1.1 (ref 0.8–1.2)
Prothrombin Time: 13.3 seconds (ref 11.4–15.2)

## 2019-12-27 LAB — BRAIN NATRIURETIC PEPTIDE: B Natriuretic Peptide: 243 pg/mL — ABNORMAL HIGH (ref 0.0–100.0)

## 2019-12-27 IMAGING — CT CT HEAD W/O CM
3 series · 15 of 47 positions shown, 18 images · non-contrast
Comparison: None.

CLINICAL DATA: Metastatic colon cancer.  Staging examination

EXAM:
CT HEAD WITHOUT CONTRAST
TECHNIQUE: Contiguous axial images were obtained from the base of the skull
through the vertex without intravenous contrast.

[Series 2: head wo · axial · 0.47mm/px · z∈[-56,+69]mm · 9 of 31 slices shown, 12 images]
[im 3/31  brain]
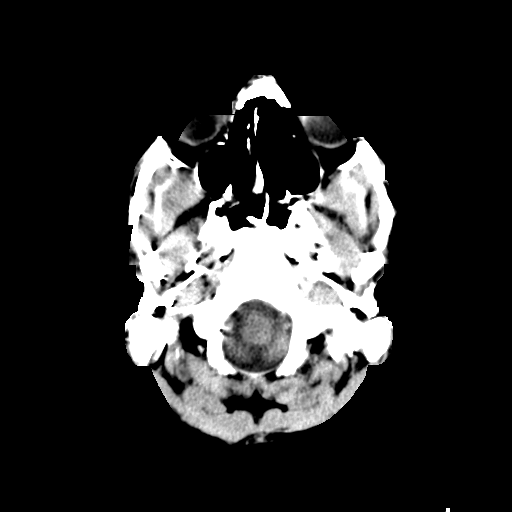
[im 3/31  bone]
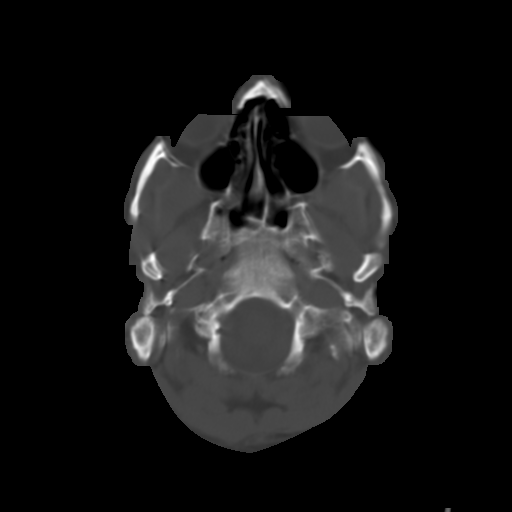
[im 6/31  brain]
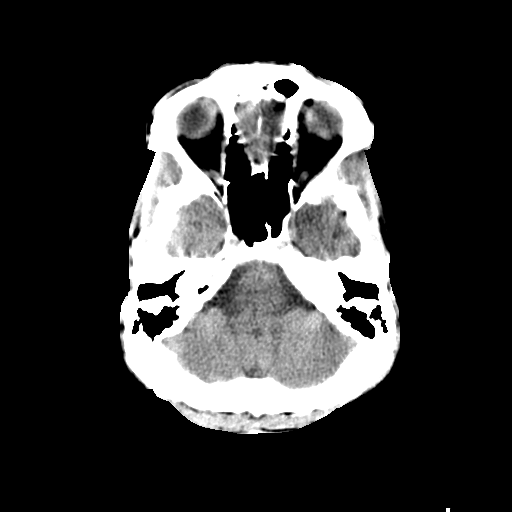
[im 9/31  brain]
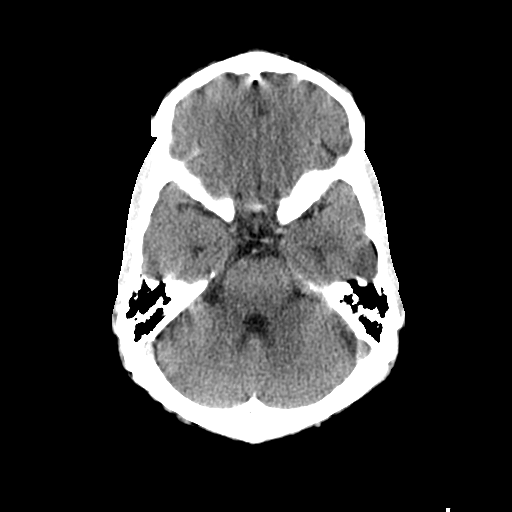
[im 12/31  brain]
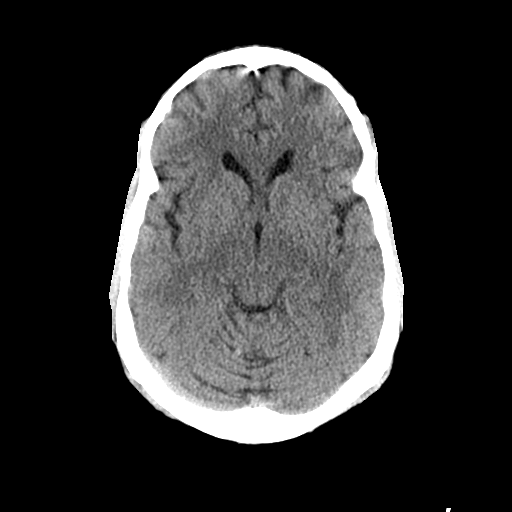
[im 16/31  brain]
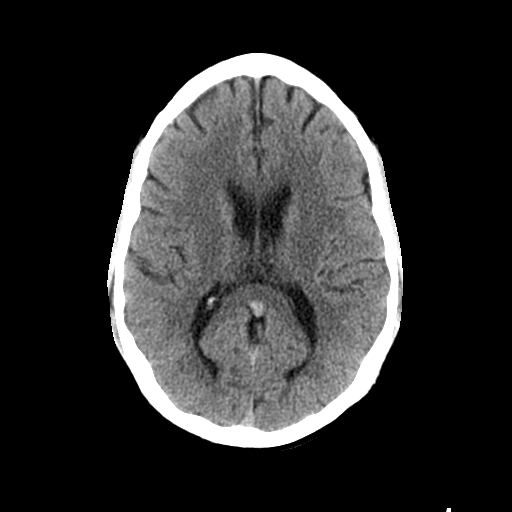
[im 16/31  bone]
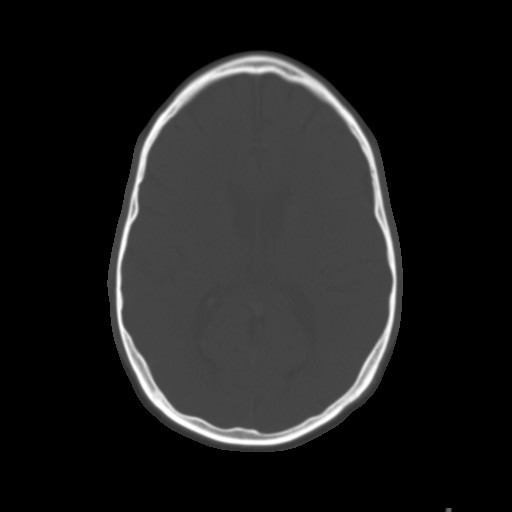
[im 19/31  brain]
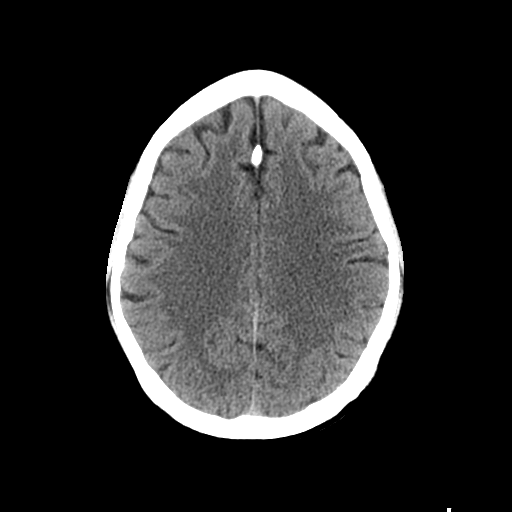
[im 22/31  brain]
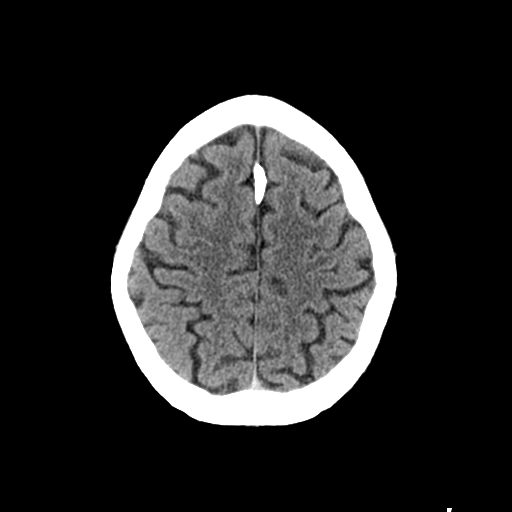
[im 25/31  brain]
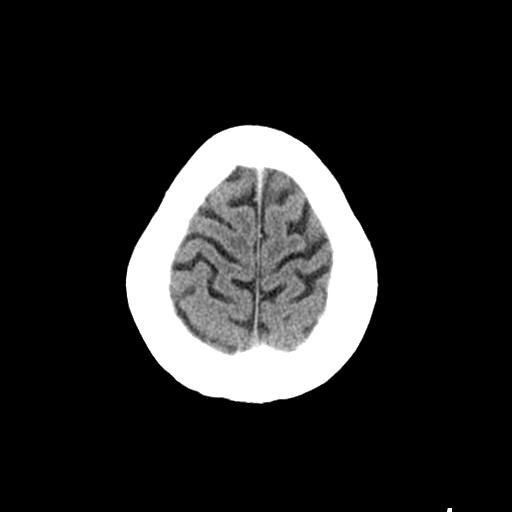
[im 28/31  brain]
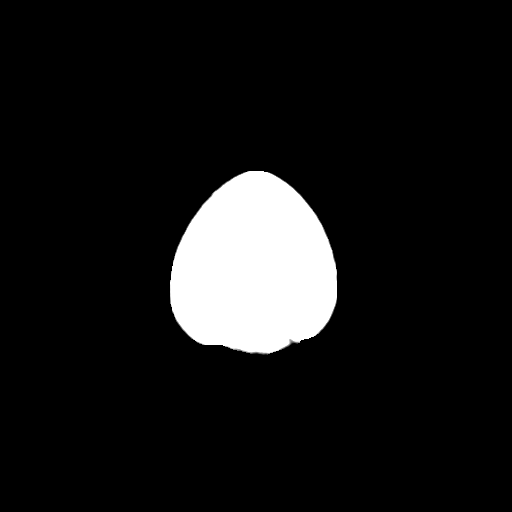
[im 28/31  bone]
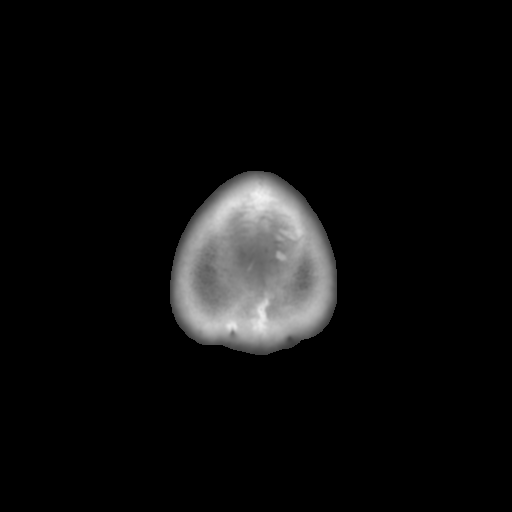

[Series 5: coronal soft tissue · coronal · 0.29mm/px · 3 of 67 slices shown]
[im 23/67  brain]
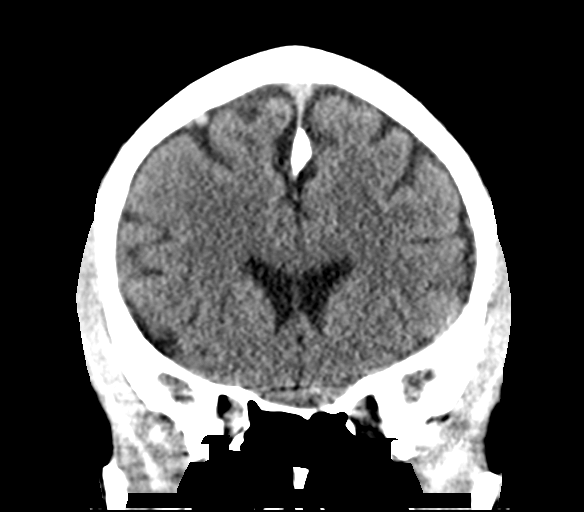
[im 30/67  brain]
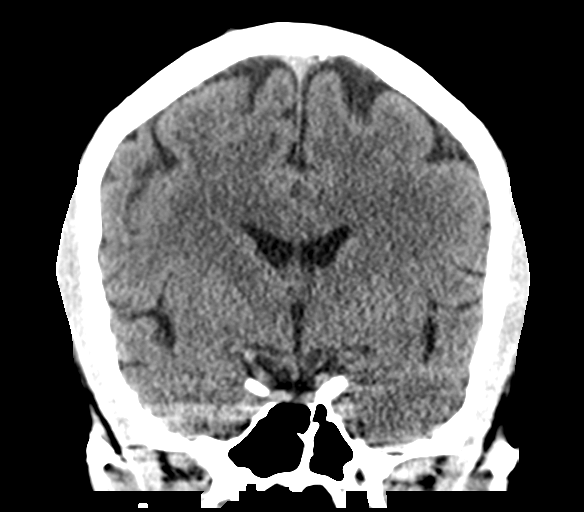
[im 37/67  brain]
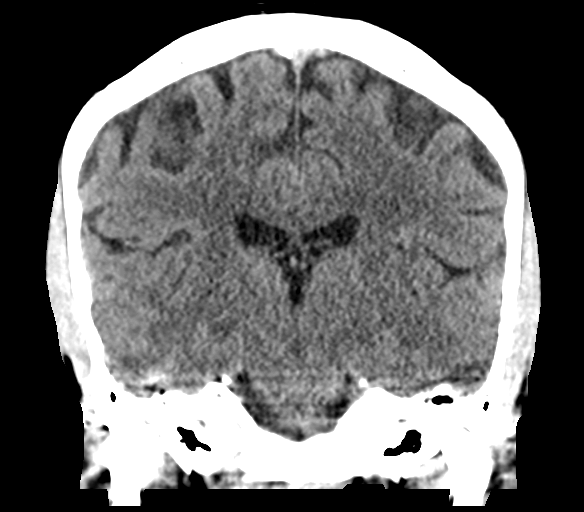

[Series 6: sagittal soft tissue · sagittal · 0.29mm/px · 3 of 56 slices shown]
[im 19/56  brain]
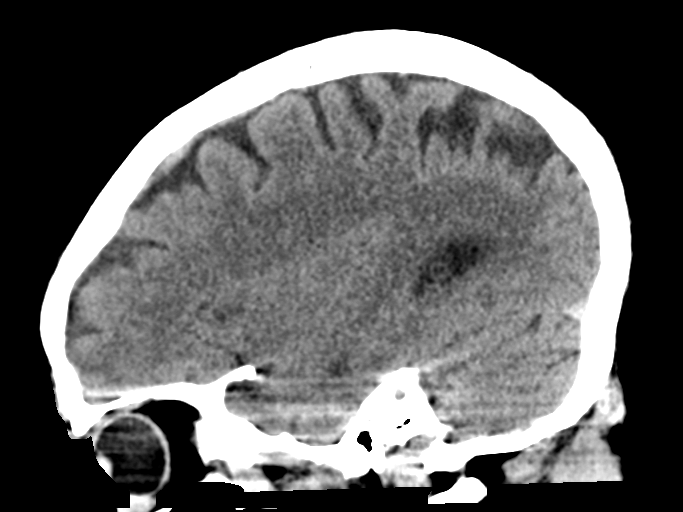
[im 28/56  brain]
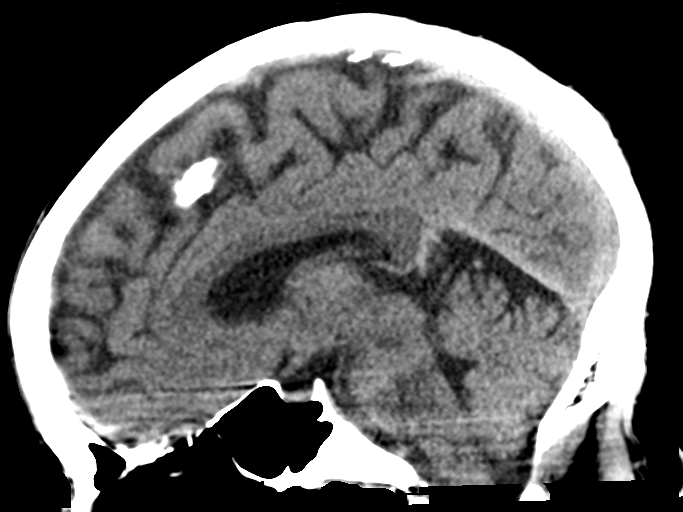
[im 37/56  brain]
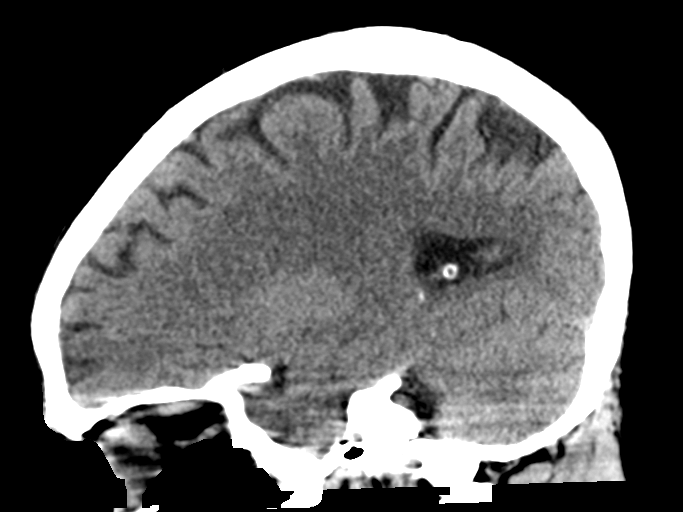

[15 of 47 positions shown; findings below may reference images not displayed]

FINDINGS: Brain: Normal anatomic configuration. No abnormal intra or
extra-axial mass lesion or fluid collection. No abnormal mass effect
or midline shift. No evidence of acute intracranial hemorrhage or
infarct. Ventricular size is normal. Cerebellum unremarkable.

Vascular: Unremarkable

Skull: Intact

Sinuses/Orbits: Paranasal sinuses are clear. Orbits are
unremarkable.

Other: Mastoid air cells and middle ear cavities are clear.
IMPRESSION: Normal examination.  No evidence of intracranial metastatic disease.

## 2019-12-27 IMAGING — CT CT ANGIO CHEST
2 of 7 series · 17 of 46 positions shown · IV contrast (OMNIPAQUE)
Comparison: Chest CT [DATE]

CLINICAL DATA: History of colon cancer with acute onset of dyspnea
and tachycardia.

EXAM:
CT ANGIOGRAPHY CHEST WITH CONTRAST
TECHNIQUE: Multidetector CT imaging of the chest was performed using the
standard protocol during bolus administration of intravenous
contrast. Multiplanar CT image reconstructions and MIPs were
obtained to evaluate the vascular anatomy.
CONTRAST:  100mL OMNIPAQUE IOHEXOL 350 MG/ML SOLN

[Series 6: thins · axial · 0.69mm/px · z∈[-301,-16]mm · 15 of 327 slices shown]
[im 21/327  lung]
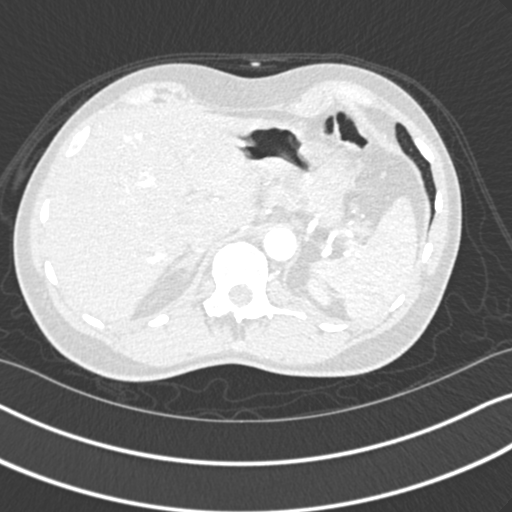
[im 41/327  soft-tissue]
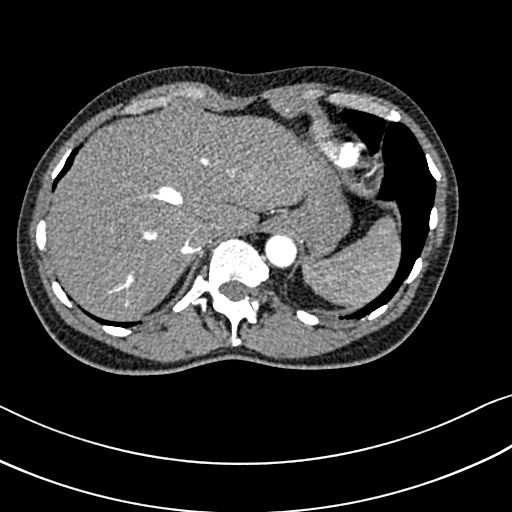
[im 62/327  lung]
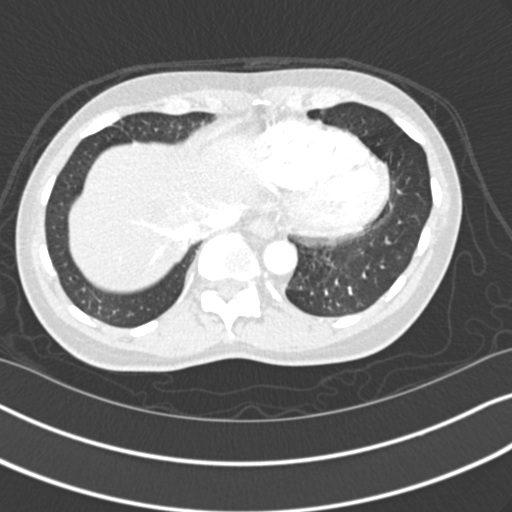
[im 82/327  soft-tissue]
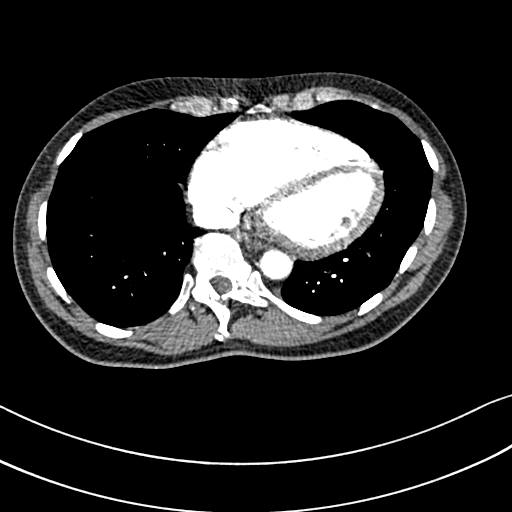
[im 102/327  lung]
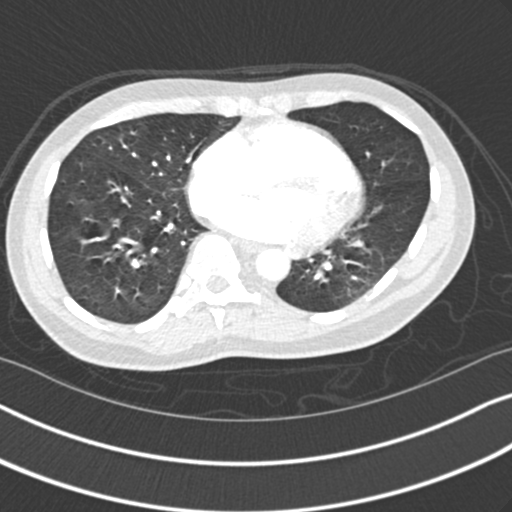
[im 123/327  soft-tissue]
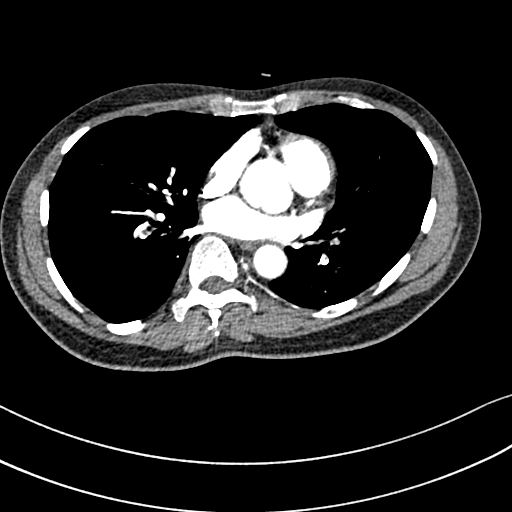
[im 143/327  lung]
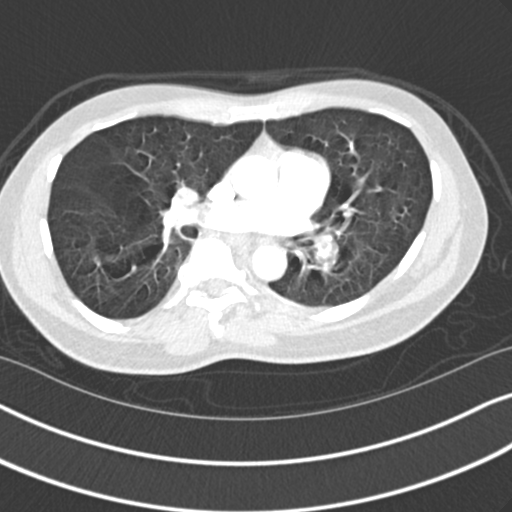
[im 164/327  soft-tissue]
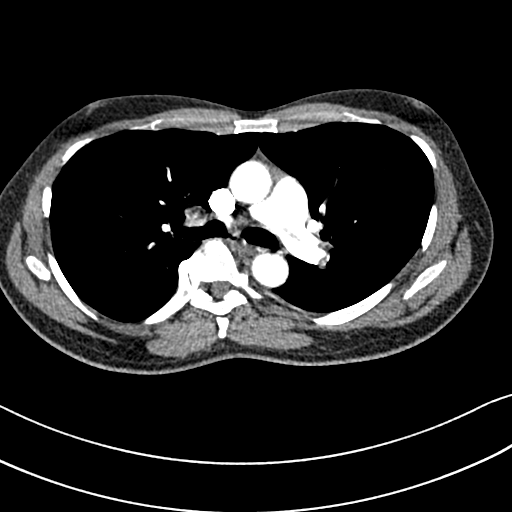
[im 184/327  lung]
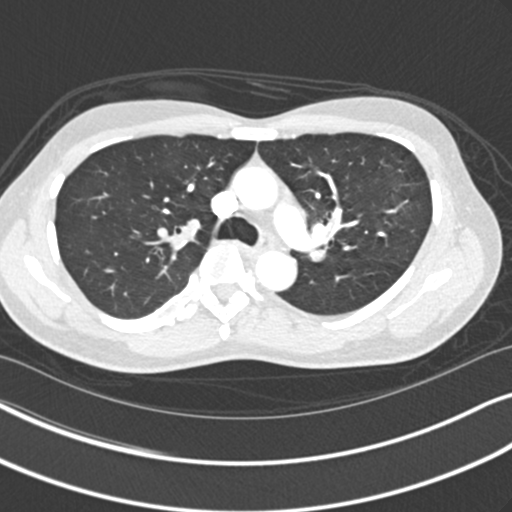
[im 204/327  soft-tissue]
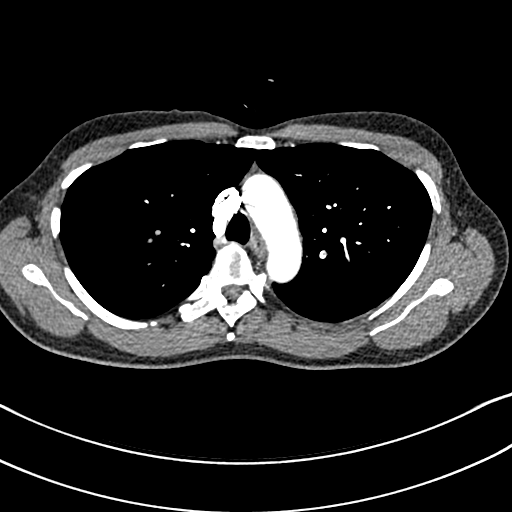
[im 225/327  lung]
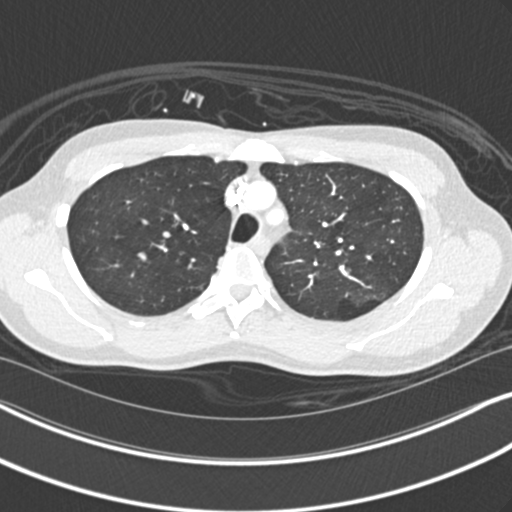
[im 245/327  soft-tissue]
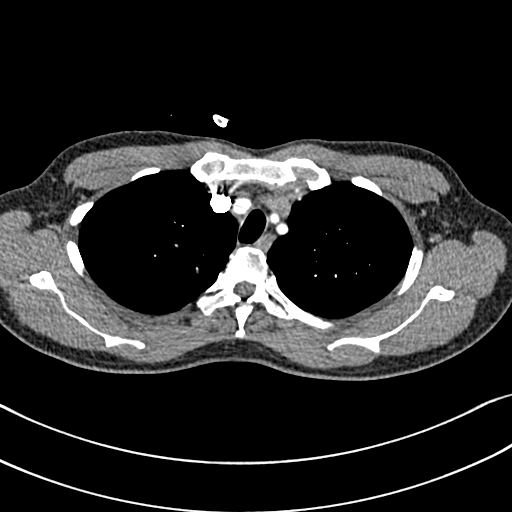
[im 265/327  lung]
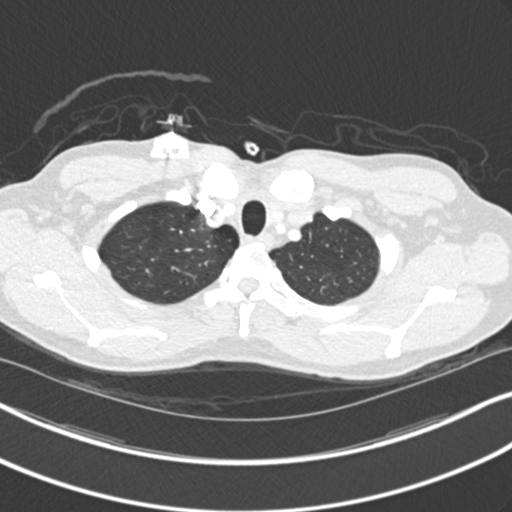
[im 286/327  soft-tissue]
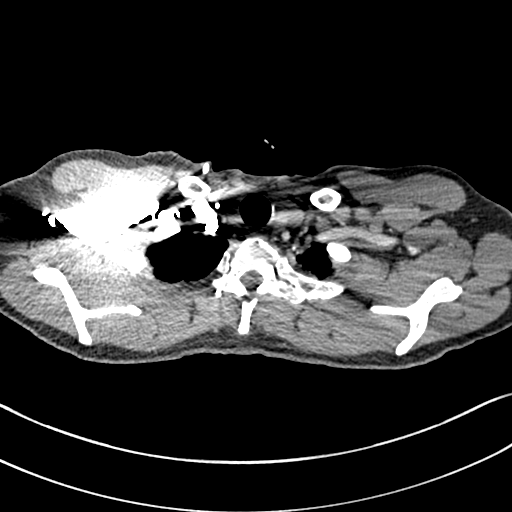
[im 306/327  lung]
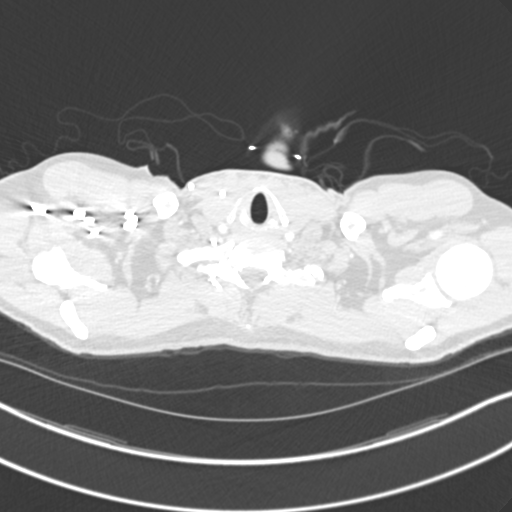

[Series 7: coronal mpr · coronal · 0.64mm/px · 2 of 71 slices shown]
[im 24/71  soft-tissue]
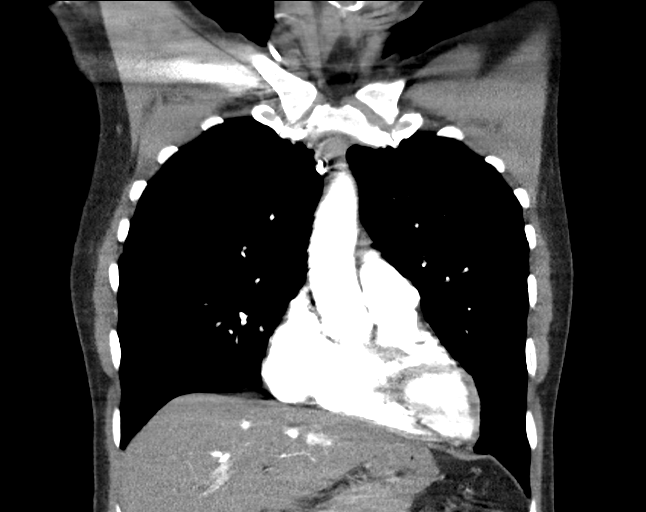
[im 47/71  soft-tissue]
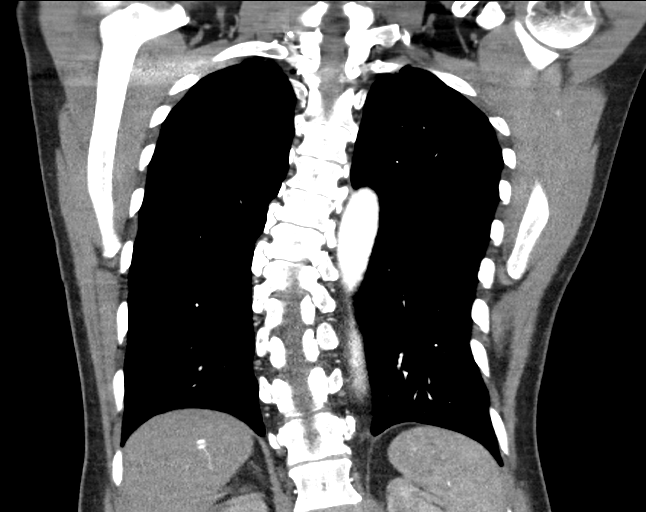

[17 of 46 positions shown; findings below may reference images not displayed]

FINDINGS: Cardiovascular: The heart is within normal limits in size and
stable. No pericardial effusion. The aorta is normal in caliber. No
dissection or atherosclerotic calcifications. The branch vessels are
patent. No definite coronary artery calcifications.

The pulmonary arterial tree is fairly well opacified. There are
significant central and peripheral filling defects consistent with
sub massive pulmonary embolism. Evidence of right heart strain with
flattening of the left ventricle. The RV LV ratio is 1.18.

Mediastinum/Nodes: No mediastinal or hilar mass or adenopathy. The
esophagus is grossly normal.

Lungs/Pleura: No worrisome pulmonary nodules to suggest pulmonary
metastatic disease. No infiltrates or effusions. No pulmonary edema.

Upper Abdomen: Hepatic metastatic disease is again demonstrated. No
significant change.

Musculoskeletal: No significant bony findings.  Moderate scoliosis.

Review of the MIP images confirms the above findings.
IMPRESSION: 1. Positive for sub massive pulmonary embolism with CT evidence of
right heart strain (RV/LV Ratio 1.18) consistent with at least
submassive (intermediate risk) PE. The presence of right heart
strain has been associated with an increased risk of morbidity and
mortality.
2. Normal thoracic aorta.
3. No worrisome pulmonary nodules or infiltrates.
4. Hepatic metastatic disease.

These results were called by telephone at the time of interpretation
on [DATE] at [DATE] to provider Dr. ZAIN nurse, ZAIN, Who
verbally acknowledged these results.

## 2019-12-27 MED ORDER — SODIUM CHLORIDE 0.9% FLUSH
10.0000 mL | INTRAVENOUS | Status: DC | PRN
  Administered 2019-12-27: 12:00:00 10 mL
  Filled 2019-12-27: qty 10

## 2019-12-27 MED ORDER — ALBUTEROL SULFATE (2.5 MG/3ML) 0.083% IN NEBU: 2.5000 mg | INHALATION_SOLUTION | Freq: Four times a day (QID) | RESPIRATORY_TRACT | Status: DC | PRN

## 2019-12-27 MED ORDER — FLUTICASONE FUROATE-VILANTEROL 200-25 MCG/INH IN AEPB
1.0000 | INHALATION_SPRAY | Freq: Every day | RESPIRATORY_TRACT | Status: DC
  Administered 2019-12-27 – 2019-12-30 (×3): 1 via RESPIRATORY_TRACT
  Filled 2019-12-27 (×2): qty 28

## 2019-12-27 MED ORDER — ALBUTEROL SULFATE (2.5 MG/3ML) 0.083% IN NEBU: 2.5000 mg | INHALATION_SOLUTION | RESPIRATORY_TRACT | Status: DC | PRN

## 2019-12-27 MED ORDER — PNEUMOCOCCAL VAC POLYVALENT 25 MCG/0.5ML IJ INJ
0.5000 mL | INJECTION | INTRAMUSCULAR | Status: AC
  Administered 2019-12-28: 13:00:00 0.5 mL via INTRAMUSCULAR
  Filled 2019-12-27 (×2): qty 0.5

## 2019-12-27 MED ORDER — HEPARIN (PORCINE) 25000 UT/250ML-% IV SOLN
1200.0000 [IU]/h | INTRAVENOUS | Status: DC
  Administered 2019-12-27: 17:00:00 1300 [IU]/h via INTRAVENOUS
  Administered 2019-12-28: 12:00:00 1200 [IU]/h via INTRAVENOUS
  Filled 2019-12-27 (×2): qty 250

## 2019-12-27 MED ORDER — LORATADINE 10 MG PO TABS: 10.0000 mg | ORAL_TABLET | Freq: Every day | ORAL | Status: DC | PRN

## 2019-12-27 MED ORDER — SODIUM CHLORIDE 0.9 % IV SOLN
75.0000 mL/h | INTRAVENOUS | Status: AC
  Administered 2019-12-27: 23:00:00 75 mL/h via INTRAVENOUS

## 2019-12-27 MED ORDER — MONTELUKAST SODIUM 10 MG PO TABS
10.0000 mg | ORAL_TABLET | Freq: Every day | ORAL | Status: DC
  Administered 2019-12-27 – 2019-12-29 (×3): 10 mg via ORAL
  Filled 2019-12-27 (×3): qty 1

## 2019-12-27 MED ORDER — HEPARIN SOD (PORK) LOCK FLUSH 100 UNIT/ML IV SOLN
INTRAVENOUS | Status: AC
  Filled 2019-12-27: qty 5

## 2019-12-27 MED ORDER — IOHEXOL 350 MG/ML SOLN
100.0000 mL | Freq: Once | INTRAVENOUS | Status: AC | PRN
  Administered 2019-12-27: 14:00:00 100 mL via INTRAVENOUS

## 2019-12-27 MED ORDER — HYDROCODONE-ACETAMINOPHEN 5-325 MG PO TABS: 1.0000 | ORAL_TABLET | ORAL | Status: DC | PRN

## 2019-12-27 MED ORDER — HEPARIN BOLUS VIA INFUSION
2150.0000 [IU] | INTRAVENOUS | Status: AC
  Administered 2019-12-27: 17:00:00 2150 [IU] via INTRAVENOUS
  Filled 2019-12-27: qty 2150

## 2019-12-27 MED ORDER — ACETAMINOPHEN 325 MG PO TABS: 650.0000 mg | ORAL_TABLET | Freq: Four times a day (QID) | ORAL | Status: DC | PRN

## 2019-12-27 MED ORDER — GABAPENTIN 300 MG PO CAPS
300.0000 mg | ORAL_CAPSULE | Freq: Every day | ORAL | Status: DC
  Administered 2019-12-27 – 2019-12-29 (×3): 300 mg via ORAL
  Filled 2019-12-27 (×3): qty 1

## 2019-12-27 MED ORDER — PANTOPRAZOLE SODIUM 40 MG PO TBEC
40.0000 mg | DELAYED_RELEASE_TABLET | Freq: Every day | ORAL | Status: DC
  Administered 2019-12-27 – 2019-12-30 (×4): 40 mg via ORAL
  Filled 2019-12-27 (×4): qty 1

## 2019-12-27 MED ORDER — ACETAMINOPHEN 650 MG RE SUPP: 650.0000 mg | Freq: Four times a day (QID) | RECTAL | Status: DC | PRN

## 2019-12-27 MED ORDER — SODIUM CHLORIDE (PF) 0.9 % IJ SOLN
INTRAMUSCULAR | Status: AC
  Filled 2019-12-27: qty 50

## 2019-12-27 NOTE — ED Notes (Signed)
hospitalist at pt bedside accompanied by interpreter

## 2019-12-27 NOTE — ED Notes (Signed)
[employer], Bethena Roys called me and is sending an interpreter, Carleane at 5:30. [facility] doesn't have anyone available at this time.

## 2019-12-27 NOTE — ED Notes (Signed)
Called [facility] and Claiborne Billings called back and is trying to find a sign language interpreter.

## 2019-12-27 NOTE — Progress Notes (Signed)
Left lower extremity venous duplex has been completed. Preliminary results can be found in CV Proc through chart review.  Results were given to Dr. Benay Spice.  12/27/19 1:59 PM Alexander Reeves RVT

## 2019-12-27 NOTE — Progress Notes (Signed)
[place] OFFICE PROGRESS NOTE   Diagnosis: Colon cancer  INTERVAL HISTORY:   Alexander Reeves completed another cycle of FOLFIRI/Avastin 12/13/2019.  He reports abdominal cramping and nausea/vomiting on the day of chemotherapy.  No further nausea and vomiting.  No diarrhea. He complains of anterior chest discomfort and acute on set dyspnea beginning this morning.  He has noted discomfort of the left calf for the past 3-4 days.  No cough or fever.  Objective:  Vital signs in last 24 hours:  Blood pressure 108/82, pulse (!) 122, temperature (!) 97.3 F (36.3 C), temperature source Tympanic, resp. rate 18, height '5\' 10"'  (1.778 m), weight 140 lb 1.6 oz (63.5 kg), SpO2 98 %.    HEENT: No thrush or ulcers Resp: Lungs clear bilaterally Cardio: Regular rate and rhythm, tachycardia GI: No hepatosplenomegaly Vascular: Trace edema at the left lower leg below the knee.  No erythema or palpable cord. Skin: Hyperpigmentation of the hands  Portacath/PICC-without erythema  Lab Results:  Lab Results  Component Value Date   WBC 7.8 12/27/2019   HGB 11.9 (L) 12/27/2019   HCT 37.9 (L) 12/27/2019   MCV 84.8 12/27/2019   PLT 211 12/27/2019   NEUTROABS 5.3 12/27/2019    CMP  Lab Results  Component Value Date   NA 140 12/13/2019   K 4.1 12/13/2019   CL 107 12/13/2019   CO2 23 12/13/2019   GLUCOSE 101 (H) 12/13/2019   BUN 18 12/13/2019   CREATININE 1.48 (H) 12/13/2019   CALCIUM 9.5 12/13/2019   PROT 7.1 12/13/2019   ALBUMIN 3.9 12/13/2019   AST 17 12/13/2019   ALT 20 12/13/2019   ALKPHOS 74 12/13/2019   BILITOT 0.3 12/13/2019   GFRNONAA 57 (L) 12/13/2019   GFRAA >60 09/28/2019    Lab Results  Component Value Date   CEA1 1.75 09/28/2019     Medications: I have reviewed the patient's current medications.   Assessment/Plan:  1.Moderately differentiated adenocarcinoma ascending colon, stage IIIb (pT3pN1), status post a right colectomy  11/19/2018 ? Lymphovascular and perineural invasion present, 2/14 lymph nodes positive, tumor deposits present ? Positive radial margin, no loss of mismatch repair protein expression, discussed case with Dr. Craig Staggers mass with surrounding inflammation at multiple margins, no gross residual disease, unclear which is the "positive "radial margin, further surgery and radiation not recommended ? Colonoscopy 11/19/2018-completely obstructing mid ascending colon mass, could not be passed with endoscope, biopsy confirmed invasive adenocarcinoma ? CT abdomen/pelvis 11/18/2018-wall thickening at the mid and distal ascending colon with mild distention of the proximal ascending colon and cecum ? CTs 10/17/2018--no acute findings, no chest lymphadenopathy, lungs clear ? Cycle 1 FOLFOX 12/28/2018 ? Cycle 2 FOLFOX 01/11/2019, Udenyca added ? Cycle 3 FOLFOX 01/25/2019,Udenyca held due to bone pain ? Cycle 4 FOLFOX 02/21/2019, Udenyca added ? Cycle 5 FOLFOX 03/09/2019, Udenyca ? Cycle 6 FOLFOX 03/21/2019, Udenyca ? Cycle 7 FOLFOX 04/04/2019, Udenyca ? Cycle 8 FOLFOX 04/18/2019, Udenyca ? Cycle 9 FOLFOX 05/02/2019, Udenyca ? Cycle 10 FOLFOX 05/16/2019, oxaliplatin, 5-FU bolus, and Udenyca held ? Cycle 11 FOLFOX 05/30/2019, oxaliplatin, 5-FU bolus, and Udenyca held ? Cycle 12 FOLFOX 06/15/2019, oxaliplatin, 5-FU bolus and Udenyca held ? CTs 09/29/2019-new hypodense enhancing liver masses consistent with metastatic disease, indistinct marginated nodularity below the pancreas head-likely small lymph nodes ? Biopsy liver lesion 10/18/2019-adenocarcinoma consistent with history of colorectal carcinoma ? Cycle 1 FOLFIRI/Avastin 11/01/2019 ? Cycle 2 FOLFIRI/Avastin 11/15/2019 ? Cycle 3 FOLFIRI/Avastin 11/29/2019 ? Cycle 4 FOLFIRI/Avastin 12/13/2019  2. Deaf 3.  Right epididymal cyst removal 03/22/2018 4. Asthma 5. Port-A-Cath placement, [name], 12/23/2018 6. Neutropenia secondary to chemotherapy-Udenyca added for  cycle 2 FOLFOX 7. Admission with febrile neutropenia 02/08/2019   Disposition: Alexander Reeves has completed 4 cycles of treatment with FOLFIRI/Avastin.  The last cycle was complicated by acute abdominal pain and nausea.  He presents today with dyspnea and tachycardia.  He has pain and mild swelling of the left lower leg.  I am concerned he could have a DVT and pulmonary embolism.  He will be referred for a stat chest CT and Doppler of the left leg.  He will return to the clinic after the studies. Chemotherapy will be held today.  We will reschedule the next chemotherapy depending on the results of today's chest CT.  Betsy Coder, MD  12/27/2019  11:50 AM Addendum: We received a preliminary report from the left lower extremity Doppler which confirms an acute DVT of the left popliteal, posterior tibial, and peroneal veins. The chest CT revealed submassive pulmonary embolism with evidence of right heart strain.  I contacted the emergency room physician.  Alexander Reeves will be transferred to the emergency room to initiate treatment for the pulmonary embolism and DVT.  He will be admitted.  I will check on him 12/28/2019.  Chemotherapy and bevacizumab will be held today.

## 2019-12-27 NOTE — Consult Note (Signed)
NAME:  Alexander Reeves., MRN:  [id number], DOB:  06-Mar-1965, LOS: 0 ADMISSION DATE:  12/27/2019, CONSULTATION DATE:  12/14 REFERRING MD: Dr. Vanita Panda, CHIEF COMPLAINT: Dyspnea  Brief History   54 year old male with metastatic colon cancer admitted from the [facility] emergency department on 12/27/2019 in the setting of a submassive pulmonary embolism.  History of present illness   This is a pleasant 54 year old male with a past medical history significant for metastatic colon cancer who presented to the [facility] emergency department from his oncology office today.  He saw Dr. Melvern Sample where he was complaining of chest tightness, palpitations and shortness of breath.  He had a CT angiogram performed which showed a pulmonary embolism with RV strain.  Patient describes several months of left leg pain but more recent swelling and then associated chest tightness and shortness of breath which is developed over the last few days.  No hemoptysis, fevers or chills.  He did not have syncope, his vital signs are currently stable.  Past Medical History  Metastatic Moderately differentiated adenocarcinoma of ascending colon (stage IV) s/p R colectomy; has been on chemotherapy (initially FOLFOX and Udenyca 12/2018 through 06/2019): Completed Cycle 4 of FOLFIRI/Avastin 11/2019 Deaf Asthma Neutropenia secondary to chemotherapy   Significant Hospital Events     Consults:    Procedures:    Significant Diagnostic Tests:  12/14 CT angiogram chest images independently reviewed showing a saddle pulmonary embolism, RV strain, bilateral extension of pulmonary emboli, no significant pulmonary parenchymal abnormality  Micro Data:  12/14 SARS-CoV-2 pending  Antimicrobials:     Interim history/subjective:    Objective   Blood pressure 111/81, pulse (!) 105, temperature 98.3 F (36.8 C), temperature source Oral, resp. rate 20, SpO2 99 %.       No intake or output data in the 24 hours ending  12/27/19 1815 There were no vitals filed for this visit.  Examination: General:  Resting comfortably in bed HENT: NCAT OP clear PULM: CTA B, normal effort CV: RRR, no mgr GI: BS+, soft, nontender MSK: normal bulk and tone Neuro: awake, alert, no distress, MAEW   Resolved Hospital Problem list     Assessment & Plan:  Submassive pulmonary embolism with evidence of RV strain based on elevated troponin and BNP: Currently hemodynamically stable.  Considered high, intermediate risk per [place] PE guidelines.  No definitive answer from the literature in terms of optimal management other than anticoagulation alone.  Should he develops shock then he should receive IV thrombolysis or mechanical thrombectomy.  I would be very cautious with IV thrombolysis however considering his history of colon cancer and what I would consider to be moderate risk of bleeding. Admit to hospitalist service Telemetry monitoring, step down admission Echocardiogram Lower extremity Doppler ultrasound IV heparin Careful monitoring of vital signs, call us if symptoms worsen overnight If he develops cardiac arrest or impending arrest then would give systemic TPA or tenecteplase If develops worsening symptoms or hypotension call us back so we could consider catheter directed thrombolysis versus mechanical thrombectomy F/u CT head result  Wheezing Prn albuterol ordered  Best practice (evaluated daily)   Per [facility]  Labs   CBC: Recent Labs  Lab 12/27/19 1130 12/27/19 1645  WBC 7.8 9.2  NEUTROABS 5.3 6.8  HGB 11.9* 12.1*  HCT 37.9* 39.5  MCV 84.8 86.6  PLT 211 034    Basic Metabolic Panel: Recent Labs  Lab 12/27/19 1130 12/27/19 1645  NA 140 136  K 4.0 4.1  CL  108 105  CO2 27 24  GLUCOSE 104* 104*  BUN 12 12  CREATININE 1.52* 1.33*  CALCIUM 9.5 8.9   GFR: Estimated Creatinine Clearance: 58.4 mL/min (A) (by C-G formula based on SCr of 1.33 mg/dL (H)). Recent Labs  Lab 12/27/19 1130  12/27/19 1645  WBC 7.8 9.2    Liver Function Tests: Recent Labs  Lab 12/27/19 1130 12/27/19 1645  AST 17 19  ALT 16 16  ALKPHOS 84 80  BILITOT 0.5 0.4  PROT 7.4 7.3  ALBUMIN 3.7 3.9   No results for input(s): LIPASE, AMYLASE in the last 168 hours. No results for input(s): AMMONIA in the last 168 hours.  ABG    Component Value Date/Time   PHART 7.318 (L) 10/17/2018 0436   PCO2ART 45.1 10/17/2018 0436   PO2ART 95.0 10/17/2018 0436   HCO3 23.4 10/17/2018 0436   TCO2 25 10/17/2018 0436   ACIDBASEDEF 3.0 (H) 10/17/2018 0436   O2SAT 97.0 10/17/2018 0436     Coagulation Profile: Recent Labs  Lab 12/27/19 1645  INR 1.1    Cardiac Enzymes: No results for input(s): CKTOTAL, CKMB, CKMBINDEX, TROPONINI in the last 168 hours.  HbA1C: No results found for: HGBA1C  CBG: No results for input(s): GLUCAP in the last 168 hours.  Review of Systems:   Gen: Denies fever, chills, weight change, fatigue, night sweats HEENT: Denies blurred vision, double vision, hearing loss, tinnitus, sinus congestion, rhinorrhea, sore throat, neck stiffness, dysphagia PULM: per HPI CV: Denies chest pain, edema, orthopnea, paroxysmal nocturnal dyspnea, palpitations GI: Denies abdominal pain, nausea, vomiting, diarrhea, hematochezia, melena, constipation, change in bowel habits GU: Denies dysuria, hematuria, polyuria, oliguria, urethral discharge Endocrine: Denies hot or cold intolerance, polyuria, polyphagia or appetite change Derm: Denies rash, dry skin, scaling or peeling skin change Heme: Denies easy bruising, bleeding, bleeding gums Neuro: Denies headache, numbness, weakness, slurred speech, loss of memory or consciousness   Past Medical History  He,  has a past medical history of Arthritis, Asthma, Deaf, GERD (gastroesophageal reflux disease), 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   Surgical History    Past Surgical History:   Procedure Laterality Date  . BIOPSY  11/19/2018   Procedure: BIOPSY;  Surgeon: Thornton Park, MD;  Location: [place];  Service: Gastroenterology;;  . BIOPSY  09/20/2019   Procedure: BIOPSY;  Surgeon: Thornton Park, MD;  Location: [facility];  Service: Gastroenterology;;  . BLADDER SURGERY  YRS AGO  . COLONOSCOPY WITH PROPOFOL N/A 11/19/2018   Procedure: COLONOSCOPY WITH PROPOFOL;  Surgeon: Thornton Park, MD;  Location: [place];  Service: Gastroenterology;  Laterality: N/A;  . COLONOSCOPY WITH PROPOFOL N/A 09/20/2019   Procedure: COLONOSCOPY WITH PROPOFOL;  Surgeon: Thornton Park, MD;  Location: [facility];  Service: Gastroenterology;  Laterality: N/A;  . EPIDIDYMECTOMY Right 03/22/2018   Procedure: SCROTAL EXPLORATION RIGHT  PARTIAL EPIDIDYMECTOMY;  Surgeon: [name], MD;  Location: [facility];  Service: Urology;  Laterality: Right;  . HEMORRHOID SURGERY  WHEN YOUNG  . LAPAROTOMY N/A 11/19/2018   Procedure: EXPLORATORY LAPAROTOMY;  Surgeon: Rolm Bookbinder, MD;  Location: [place];  Service: General;  Laterality: N/A;  . NO PAST SURGERIES    . PARTIAL COLECTOMY Right 11/19/2018   Procedure: PARTIAL COLECTOMY;  Surgeon: Rolm Bookbinder, MD;  Location: [place];  Service: General;  Laterality: Right;  . POLYPECTOMY  09/20/2019   Procedure: POLYPECTOMY;  Surgeon: Thornton Park, MD;  Location: [facility];  Service:  Gastroenterology;;  . [name] PLACEMENT Right 12/23/2018   Procedure: INSERTION PORT-A-CATH WITH ULTRASOUND GUIDANCE;  Surgeon: Rolm Bookbinder, MD;  Location: [place];  Service: General;  Laterality: Right;  . SUBMUCOSAL TATTOO INJECTION  11/19/2018   Procedure: SUBMUCOSAL TATTOO INJECTION;  Surgeon: Thornton Park, MD;  Location: [place];  Service: Gastroenterology;;  . [name]     Social History   reports that he quit smoking about 3 years ago. His smoking use included cigarettes. He has  never used smokeless tobacco. He reports current alcohol use. He reports current drug use. Drug: Marijuana.   Family History   His family history includes Heart disease in his father. There is no history of Colon cancer, Esophageal cancer, Rectal cancer, or Stomach cancer.   Allergies Allergies  Allergen Reactions  . Cheese Shortness Of Breath and Swelling    Swelling of the throat  . Eggs Or Egg-Derived Products Shortness Of Breath and Swelling    Swelling of the throat  . Penicillins Anaphylaxis    Has patient had a PCN reaction causing immediate rash, facial/tongue/throat swelling, SOB or lightheadedness with hypotension: Yes Has patient had a PCN reaction causing severe rash involving mucus membranes or skin necrosis: Unknown Has patient had a PCN reaction that required hospitalization: Unknown Has patient had a PCN reaction occurring within the last 10 years: Unknown If all of the above answers are "NO", then may proceed with Cephalosporin use.   . Gemfibrozil Nausea Only     Home Medications  Prior to Admission medications   Medication Sig Start Date End Date Taking? Authorizing Provider  albuterol (PROVENTIL) (2.5 MG/3ML) 0.083% nebulizer solution Take 3 mLs (2.5 mg total) by nebulization every 6 (six) hours as needed for wheezing or shortness of breath. 10/03/19 10/02/20 Yes Ladell Pier, MD  albuterol (VENTOLIN HFA) 108 (90 Base) MCG/ACT inhaler Inhale 2 puffs into the lungs every 4 (four) hours as needed for wheezing or shortness of breath. 12/05/19  Yes Ladell Pier, MD  fenofibrate (TRICOR) 145 MG tablet TAKE 1 TABLET (145 MG TOTAL) BY MOUTH DAILY. 10/12/19  Yes [name], NP  fluticasone furoate-vilanterol (BREO ELLIPTA) 200-25 MCG/INH AEPB Inhale 1 puff into the lungs daily. Patient taking differently: Inhale 1 puff into the lungs daily as needed (shortness of breath). 09/26/19  Yes [name], NP  gabapentin (NEURONTIN) 300 MG capsule Take 1 capsule  (300 mg total) by mouth at bedtime. 11/21/19  Yes McDonald, Stephan Minister, DPM  ibuprofen (ADVIL) 600 MG tablet Take 1 tablet (600 mg total) by mouth every 8 (eight) hours as needed for moderate pain. 08/25/19  Yes [name], NP  lidocaine-prilocaine (EMLA) cream Apply 1 application topically as needed. To feet Patient taking differently: Apply 1 application topically daily as needed (foot pain). 09/12/19  Yes McDonald, Adam R, DPM  montelukast (SINGULAIR) 10 MG tablet Take 1 tablet (10 mg total) by mouth at bedtime. 08/25/19  Yes [name], NP  pantoprazole (PROTONIX) 40 MG tablet Take 1 tablet (40 mg total) by mouth daily. 11/16/19  Yes Ladell Pier, MD  ciclopirox (PENLAC) 8 % solution Apply topically at bedtime. Apply over nail and surrounding skin. Apply daily over previous coat. After seven (7) days, may remove with alcohol and continue cycle. Patient not taking: No sig reported 11/21/19   Criselda Peaches, DPM  loratadine (CLARITIN) 10 MG tablet Take 10 mg by mouth daily as needed for allergies.  [provider]  methocarbamol (ROBAXIN) 500 MG tablet Take 1 tablet (500 mg total) by mouth every 8 (eight) hours as needed for muscle spasms. 09/26/19   [name], NP  Naphazoline HCl (CLEAR EYES OP) Place 1 drop into both eyes daily as needed (redness/dryness).    [provider]     Critical care time: 30 minutes     Roselie Awkward, MD [place] [facility] Pager: [phone] Cell: [phone] If no response, call [phone]

## 2019-12-27 NOTE — Progress Notes (Signed)
@   42 Transported to ER room #4 via W/C with sign language interpreter present in stable condition for admission to treat significant PE. Report provided to Lorre Nick, RN with H/O present illness and oncology history. VS are stable and reported. MD in room with patient at this time. Patient understands reason for ER admission and awaiting inpatient bed.

## 2019-12-27 NOTE — H&P (Signed)
Alexander BETKE Sr. VEH:[id number] DOB: August 13, 1965 DOA: 12/27/2019   PCP: [name], NP   Outpatient Specialists:      Oncology   Dr. Benay [facility]ice [facility]  Patient arrived to ER on 12/27/19 at 1614 Referred by Attending Carmin Muskrat, MD   Patient coming from: home     Chief Complaint: Tachycardia shortness of breath   HPI: Alexander Reeves. is a 54 y.o. male with medical history significant of Colon CA, deaf, asthma, s/p R colectomy    Presented with dyspnea tachycardia Patient presents after his primary oncologist today also reported left lower leg swelling oncology was very worried about possible DVT and PE. His chemotherapy was held today and he had stat CT and Dopplers done His left leg did show DVT of left popliteal posterior tibial and peroneal veins. CTA did show submassive pulmonary embolism with evidence of right heart strain Oncology recommend patient to present to emergency department  Infectious risk factors:  Reports shortness of breath,       Initial COVID TEST   in house  PCR testing  Pending  Lab Results  Component Value Date   [place] NEGATIVE 09/16/2019   [place] NEGATIVE 02/08/2019   [place] NOT DETECTED 12/20/2018   [place] NEGATIVE 11/18/2018     Regarding pertinent Chronic problems:     Hyperlipidemia -  onTRICOR Lipid Panel     Component Value Date/Time   CHOL 189 08/25/2019 1105   TRIG 168 (H) 08/25/2019 1105   HDL 63 08/25/2019 1105   CHOLHDL 3.0 08/25/2019 1105   LDLCALC 97 08/25/2019 1105   LABVLDL 29 08/25/2019 1105   Hx of colon ca sp 4 cycles of chemo  Claratin singulair      CKD stage IIIb- baseline Cr 1.5 Estimated Creatinine Clearance: 58.4 mL/min (A) (by C-G formula based on SCr of 1.33 mg/dL (H)).  Lab Results  Component Value Date   CREATININE 1.33 (H) 12/27/2019   CREATININE 1.52 (H) 12/27/2019   CREATININE 1.48 (H) 12/13/2019     Chronic anemia - baseline hg Hemoglobin  & Hematocrit  Recent Labs    12/13/19 0957 12/27/19 1130 12/27/19 1645  HGB 11.3* 11.9* 12.1*   While in ER: Noted to be tachycardic    ER Provider Called:  Pulmonology    Dr.McQuaid  They Recommend admit to medicine    consult note in chart  Hospitalist was called for admission for PE  The following Work up has been ordered so far:  Orders Placed This Encounter  Procedures  . Resp Panel by RT-PCR (Flu A&B, Covid) Nasopharyngeal Swab  . CT HEAD WO CONTRAST  . Comprehensive metabolic panel  . CBC with Differential  . APTT  . Protime-INR  . Brain natriuretic peptide  . Heparin level (unfractionated)  . CBC  . heparin per pharmacy consult  . Consult to hospitalist  ALL PATIENTS BEING ADMITTED/HAVING PROCEDURES NEED COVID-19 SCREENING  . ED EKG  . ECHOCARDIOGRAM COMPLETE     Following Medications were ordered in ER: Medications  heparin bolus via infusion 2,150 Units (2,150 Units Intravenous Bolus from Bag 12/27/19 1706)    Followed by  heparin ADULT infusion 100 units/mL (25000 units/223m sodium chloride 0.45%) (1,300 Units/hr Intravenous New Bag/Given 12/27/19 1705)  albuterol (PROVENTIL) (2.5 MG/3ML) 0.083% nebulizer solution 2.5 mg (has no administration in time range)        Consult Orders  (From admission, onward)         Start  Ordered   12/27/19 1904  Consult to hospitalist  ALL PATIENTS BEING ADMITTED/HAVING PROCEDURES NEED COVID-19 SCREENING  Once       Comments: ALL PATIENTS BEING ADMITTED/HAVING PROCEDURES NEED COVID-19 SCREENING  Provider:  (Not yet assigned)  Question Answer Comment  Place call to: [facility]   Reason for Consult Admit      12/27/19 1903           Significant initial  Findings: Abnormal Labs Reviewed  COMPREHENSIVE METABOLIC PANEL - Abnormal; Notable for the following components:      Result Value   Glucose, Bld 104 (*)    Creatinine, Ser 1.33 (*)    All other components within normal limits  CBC WITH  DIFFERENTIAL/PLATELET - Abnormal; Notable for the following components:   Hemoglobin 12.1 (*)    RDW 18.0 (*)    All other components within normal limits  BRAIN NATRIURETIC PEPTIDE - Abnormal; Notable for the following components:   B Natriuretic Peptide 243.0 (*)    All other components within normal limits  TROPONIN I (HIGH SENSITIVITY) - Abnormal; Notable for the following components:   Troponin I (High Sensitivity) 311 (*)    All other components within normal limits    Otherwise labs showing:    Recent Labs  Lab 12/27/19 1130 12/27/19 1645  NA 140 136  K 4.0 4.1  CO2 27 24  GLUCOSE 104* 104*  BUN 12 12  CREATININE 1.52* 1.33*  CALCIUM 9.5 8.9    Cr   stable,    Lab Results  Component Value Date   CREATININE 1.33 (H) 12/27/2019   CREATININE 1.52 (H) 12/27/2019   CREATININE 1.48 (H) 12/13/2019    Recent Labs  Lab 12/27/19 1130 12/27/19 1645  AST 17 19  ALT 16 16  ALKPHOS 84 80  BILITOT 0.5 0.4  PROT 7.4 7.3  ALBUMIN 3.7 3.9   Lab Results  Component Value Date   CALCIUM 8.9 12/27/2019   WBC      Component Value Date/Time   WBC 9.2 12/27/2019 1645   LYMPHSABS 1.1 12/27/2019 1645   LYMPHSABS 1.1 08/25/2019 1105   MONOABS 0.9 12/27/2019 1645   EOSABS 0.4 12/27/2019 1645   EOSABS 0.2 08/25/2019 1105   BASOSABS 0.1 12/27/2019 1645   BASOSABS 0.0 08/25/2019 1105   Plt: Lab Results  Component Value Date   PLT 215 12/27/2019      COVID-19 Labs  No results for input(s): DDIMER, FERRITIN, LDH, CRP in the last 72 hours.  Lab Results  Component Value Date   SARSCOV2NAA NEGATIVE 09/16/2019   [place] NEGATIVE 02/08/2019   SARSCOV2NAA NOT DETECTED 12/20/2018   [place] NEGATIVE 11/18/2018    HG/HCT   Stable,     Component Value Date/Time   HGB 12.1 (L) 12/27/2019 1645   HGB 11.9 (L) 12/27/2019 1130   HGB 14.1 08/25/2019 1105   HCT 39.5 12/27/2019 1645   HCT 43.6 08/25/2019 1105   MCV 86.6 12/27/2019 1645   MCV 89 08/25/2019 1105        Troponin 311   ECG: Ordered Personally reviewed by me showing: HR : 104 Rhythm:  Sinus tachycardia    no evidence of ischemic changes QTC 438   BNP (last 3 results) Recent Labs    12/27/19 1645  BNP 243.0*       UA not ordered   Ordered  CT HEAD   NON acute   CTA chest - PE, sub massive no evidence of infiltrate  Doppler showed left DVT   ED Triage Vitals [12/27/19 1646]  Enc Vitals Group     BP (!) 115/100     Pulse Rate (!) 110     Resp 16     Temp 98.3 F (36.8 C)     Temp Source Oral     SpO2 97 %     Weight      Height      Head Circumference      Peak Flow      Pain Score 0     Pain Loc      Pain Edu?      Excl. in [place]?   TKWI(09)@       Latest  Blood pressure 111/81, pulse (!) 105, temperature 98.3 F (36.8 C), temperature source Oral, resp. rate 20, SpO2 99 %.    Review of Systems:    Pertinent positives include:   fatigue,  shortness of breath at rest lower extremity swelling palpitations Constitutional:  No weight loss, night sweats, Fevers, chills, weight loss  HEENT:  No headaches, Difficulty swallowing,Tooth/dental problems,Sore throat,  No sneezing, itching, ear ache, nasal congestion, post nasal drip,  Cardio-vascular:  No chest pain, Orthopnea, PND, anasarca, dizziness,   GI:  No heartburn, indigestion, abdominal pain, nausea, vomiting, diarrhea, change in bowel habits, loss of appetite, melena, blood in stool, hematemesis Resp:  no . No dyspnea on exertion, No excess mucus, no productive cough, No non-productive cough, No coughing up of blood.No change in color of mucus.No wheezing. Skin:  no rash or lesions. No jaundice GU:  no dysuria, change in color of urine, no urgency or frequency. No straining to urinate.  No flank pain.  Musculoskeletal:  No joint pain or no joint swelling. No decreased range of motion. No back pain.  Psych:  No change in mood or affect. No depression or anxiety. No memory loss.  Neuro: no  localizing neurological complaints, no tingling, no weakness, no double vision, no gait abnormality, no slurred speech, no confusion  All systems reviewed and apart from Alexander Reeves all are negative  Past Medical History:   Past Medical History:  Diagnosis Date  . Arthritis    KNEES  . Asthma   . Deaf   . GERD (gastroesophageal reflux disease)    OCC  . 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 Scrotal cyst   . Thyroid disease   . Urinary frequency    OCC     Past Surgical History:  Procedure Laterality Date  . BIOPSY  11/19/2018   Procedure: BIOPSY;  Surgeon: Thornton Park, MD;  Location: [facility];  Service: Gastroenterology;;  . BIOPSY  09/20/2019   Procedure: BIOPSY;  Surgeon: Thornton Park, MD;  Location: [facility];  Service: Gastroenterology;;  . BLADDER SURGERY  YRS AGO  . COLONOSCOPY WITH PROPOFOL N/A 11/19/2018   Procedure: COLONOSCOPY WITH PROPOFOL;  Surgeon: Thornton Park, MD;  Location: [facility];  Service: Gastroenterology;  Laterality: N/A;  . COLONOSCOPY WITH PROPOFOL N/A 09/20/2019   Procedure: COLONOSCOPY WITH PROPOFOL;  Surgeon: Thornton Park, MD;  Location: [facility];  Service: Gastroenterology;  Laterality: N/A;  . EPIDIDYMECTOMY Right 03/22/2018   Procedure: SCROTAL EXPLORATION RIGHT  PARTIAL EPIDIDYMECTOMY;  Surgeon: [name], MD;  Location: [facility];  Service: Urology;  Laterality: Right;  . HEMORRHOID SURGERY  WHEN YOUNG  . LAPAROTOMY N/A 11/19/2018   Procedure: EXPLORATORY LAPAROTOMY;  Surgeon: Rolm Bookbinder, MD;  Location: [facility];  Service:  General;  Laterality: N/A;  . NO PAST SURGERIES    . PARTIAL COLECTOMY Right 11/19/2018   Procedure: PARTIAL COLECTOMY;  Surgeon: Rolm Bookbinder, MD;  Location: [facility];  Service: General;  Laterality: Right;  . POLYPECTOMY  09/20/2019   Procedure: POLYPECTOMY;  Surgeon: Thornton Park, MD;  Location: [facility];  Service: Gastroenterology;;  . [name] PLACEMENT  Right 12/23/2018   Procedure: INSERTION PORT-A-CATH WITH ULTRASOUND GUIDANCE;  Surgeon: Rolm Bookbinder, MD;  Location: [facility];  Service: General;  Laterality: Right;  . SUBMUCOSAL TATTOO INJECTION  11/19/2018   Procedure: SUBMUCOSAL TATTOO INJECTION;  Surgeon: Thornton Park, MD;  Location: [facility];  Service: Gastroenterology;;  . [name]    Social History:  Ambulatory  independently       reports that he quit smoking about 3 years ago. His smoking use included cigarettes. He has never used smokeless tobacco. He reports current alcohol use. He reports current drug use. Drug: Marijuana.     Family History:   Family History  Problem Relation Age of Onset  . Heart disease Father   . Colon cancer Neg Hx   . Esophageal cancer Neg Hx   . Rectal cancer Neg Hx   . Stomach cancer Neg Hx     Allergies: Allergies  Allergen Reactions  . Cheese Shortness Of Breath and Swelling    Swelling of the throat  . Eggs Or Egg-Derived Products Shortness Of Breath and Swelling    Swelling of the throat  . Penicillins Anaphylaxis    Has patient had a PCN reaction causing immediate rash, facial/tongue/throat swelling, SOB or lightheadedness with hypotension: Yes Has patient had a PCN reaction causing severe rash involving mucus membranes or skin necrosis: Unknown Has patient had a PCN reaction that required hospitalization: Unknown Has patient had a PCN reaction occurring within the last 10 years: Unknown If all of the above answers are "NO", then may proceed with Cephalosporin use.   . Gemfibrozil Nausea Only     Prior to Admission medications   Medication Sig Start Date End Date Taking? Authorizing Provider  albuterol (PROVENTIL) (2.5 MG/3ML) 0.083% nebulizer solution Take 3 mLs (2.5 mg total) by nebulization every 6 (six) hours as needed for wheezing or shortness of breath. 10/03/19 10/02/20 Yes Ladell Pier, MD  albuterol (VENTOLIN HFA) 108 (90  Base) MCG/ACT inhaler Inhale 2 puffs into the lungs every 4 (four) hours as needed for wheezing or shortness of breath. 12/05/19  Yes Ladell Pier, MD  fenofibrate (TRICOR) 145 MG tablet TAKE 1 TABLET (145 MG TOTAL) BY MOUTH DAILY. 10/12/19  Yes [name], NP  fluticasone furoate-vilanterol (BREO ELLIPTA) 200-25 MCG/INH AEPB Inhale 1 puff into the lungs daily. Patient taking differently: Inhale 1 puff into the lungs daily as needed (shortness of breath). 09/26/19  Yes [name], NP  gabapentin (NEURONTIN) 300 MG capsule Take 1 capsule (300 mg total) by mouth at bedtime. 11/21/19  Yes McDonald, Stephan Minister, DPM  ibuprofen (ADVIL) 600 MG tablet Take 1 tablet (600 mg total) by mouth every 8 (eight) hours as needed for moderate pain. 08/25/19  Yes [name], NP  lidocaine-prilocaine (EMLA) cream Apply 1 application topically as needed. To feet Patient taking differently: Apply 1 application topically daily as needed (foot pain). 09/12/19  Yes McDonald, Adam R, DPM  montelukast (SINGULAIR) 10 MG tablet Take 1 tablet (10 mg total) by mouth at bedtime. 08/25/19  Yes [name], NP  pantoprazole (PROTONIX)  40 MG tablet Take 1 tablet (40 mg total) by mouth daily. 11/16/19  Yes Ladell Pier, MD  ciclopirox (PENLAC) 8 % solution Apply topically at bedtime. Apply over nail and surrounding skin. Apply daily over previous coat. After seven (7) days, may remove with alcohol and continue cycle. Patient not taking: No sig reported 11/21/19   Criselda Peaches, DPM  loratadine (CLARITIN) 10 MG tablet Take 10 mg by mouth daily as needed for allergies.    [provider]  methocarbamol (ROBAXIN) 500 MG tablet Take 1 tablet (500 mg total) by mouth every 8 (eight) hours as needed for muscle spasms. 09/26/19   [name], NP  Naphazoline HCl (CLEAR EYES OP) Place 1 drop into both eyes daily as needed (redness/dryness).    [provider]   Physical Exam: Vitals  with BMI 12/27/2019 12/27/2019 12/27/2019  Height - - -  Weight - - -  BMI - - -  Systolic 976 734 193  Diastolic 81 790 93  Pulse 105 110 116     1. General:  in No  Acute distress    Chronically ill  -appearing 2. Psychological: Alert and   Oriented 3. Head/ENT:    Dry Mucous Membranes                          Head Non traumatic, neck supple                          Poor Dentition 4. SKIN:  decreased Skin turgor,  Skin clean Dry and intact no rash 5. Heart: Regular rate and rhythm no  Murmur, no Rub or gallop 6. Lungs:  no wheezes or crackles   7. Abdomen: Soft,  non-tender, Non distended bowel sounds present 8. Lower extremities: no clubbing, cyanosis, no  edema 9. Neurologically Grossly intact, moving all 4 extremities equally  10. MSK: Normal range of motion   All other LABS:     Recent Labs  Lab 12/27/19 1130 12/27/19 1645  WBC 7.8 9.2  NEUTROABS 5.3 6.8  HGB 11.9* 12.1*  HCT 37.9* 39.5  MCV 84.8 86.6  PLT 211 215     Recent Labs  Lab 12/27/19 1130 12/27/19 1645  NA 140 136  K 4.0 4.1  CL 108 105  CO2 27 24  GLUCOSE 104* 104*  BUN 12 12  CREATININE 1.52* 1.33*  CALCIUM 9.5 8.9     Recent Labs  Lab 12/27/19 1130 12/27/19 1645  AST 17 19  ALT 16 16  ALKPHOS 84 80  BILITOT 0.5 0.4  PROT 7.4 7.3  ALBUMIN 3.7 3.9       Cultures:    Component Value Date/Time   SDES  10/03/2019 1658    URINE, CLEAN CATCH Performed at [facility], [place] [address] [place], [place] [address]    SPECREQUEST  10/03/2019 1658    NONE Performed at [facility], [place] [address], [place], [place] [address]    CULT  10/03/2019 1658    NO GROWTH Performed at [facility] [address], [place], [place] [address]    REPTSTATUS 10/04/2019 FINAL 10/03/2019 1658     Radiological Exams on Admission: CT HEAD WO CONTRAST  Result Date: 12/27/2019 CLINICAL DATA:  Metastatic colon cancer.  Staging examination  EXAM: CT HEAD WITHOUT CONTRAST TECHNIQUE: Contiguous axial images were obtained from the base of  the skull through the vertex without intravenous contrast. COMPARISON:  None. FINDINGS: Brain: Normal anatomic configuration. No abnormal intra or extra-axial mass lesion or fluid collection. No abnormal mass effect or midline shift. No evidence of acute intracranial hemorrhage or infarct. Ventricular size is normal. Cerebellum unremarkable. Vascular: Unremarkable Skull: Intact Sinuses/Orbits: Paranasal sinuses are clear. Orbits are unremarkable. Other: Mastoid air cells and middle ear cavities are clear. IMPRESSION: Normal examination.  No evidence of intracranial metastatic disease. Electronically Signed   By: Fidela Salisbury MD   On: 12/27/2019 19:06   CT ANGIO CHEST PE W OR WO CONTRAST  Result Date: 12/27/2019 CLINICAL DATA:  History of colon cancer with acute onset of dyspnea and tachycardia. EXAM: CT ANGIOGRAPHY CHEST WITH CONTRAST TECHNIQUE: Multidetector CT imaging of the chest was performed using the standard protocol during bolus administration of intravenous contrast. Multiplanar CT image reconstructions and MIPs were obtained to evaluate the vascular anatomy. CONTRAST:  126m OMNIPAQUE IOHEXOL 350 MG/ML SOLN COMPARISON:  Chest CT 09/29/2019 FINDINGS: Cardiovascular: The heart is within normal limits in size and stable. No pericardial effusion. The aorta is normal in caliber. No dissection or atherosclerotic calcifications. The branch vessels are patent. No definite coronary artery calcifications. The pulmonary arterial tree is fairly well opacified. There are significant central and peripheral filling defects consistent with sub massive pulmonary embolism. Evidence of right heart strain with flattening of the left ventricle. The RV LV ratio is 1.18. Mediastinum/Nodes: No mediastinal or hilar mass or adenopathy. The esophagus is grossly normal. Lungs/Pleura: No worrisome pulmonary nodules to suggest  pulmonary metastatic disease. No infiltrates or effusions. No pulmonary edema. Upper Abdomen: Hepatic metastatic disease is again demonstrated. No significant change. Musculoskeletal: No significant bony findings.  Moderate scoliosis. Review of the MIP images confirms the above findings. IMPRESSION: 1. Positive for sub massive pulmonary embolism with CT evidence of right heart strain (RV/LV Ratio 1.18) consistent with at least submassive (intermediate risk) PE. The presence of right heart strain has been associated with an increased risk of morbidity and mortality. 2. Normal thoracic aorta. 3. No worrisome pulmonary nodules or infiltrates. 4. Hepatic metastatic disease. These results were called by telephone at the time of interpretation on 12/27/2019 at 2:50 pm to provider Dr. SCarin Reeves, SManuela Reeves Who verbally acknowledged these results. Electronically Signed   By: PMarijo SanesM.D.   On: 12/27/2019 14:50   VAS [place]OWER EXTREMITY VENOUS (DVT)  Result Date: 12/27/2019  Lower Venous DVT Study Indications: Swelling.  Risk Factors: Cancer. Comparison Study: No prior studies. Performing Technologist: GOliver HumRVT  Examination Guidelines: A complete evaluation includes B-mode imaging, spectral Doppler, color Doppler, and power Doppler as needed of all accessible portions of each vessel. Bilateral testing is considered an integral part of a complete examination. Limited examinations for reoccurring indications may be performed as noted. The reflux portion of the exam is performed with the patient in reverse Trendelenburg.  +-----+---------------+---------+-----------+----------+--------------+ RIGHTCompressibilityPhasicitySpontaneityPropertiesThrombus Aging +-----+---------------+---------+-----------+----------+--------------+ CFV  Full           Yes      Yes                                 +-----+---------------+---------+-----------+----------+--------------+    +---------+---------------+---------+-----------+----------+--------------+ LEFT     CompressibilityPhasicitySpontaneityPropertiesThrombus Aging +---------+---------------+---------+-----------+----------+--------------+ CFV      Full           Yes      Yes                                 +---------+---------------+---------+-----------+----------+--------------+  SFJ      Full                                                        +---------+---------------+---------+-----------+----------+--------------+ FV Prox  Full                                                        +---------+---------------+---------+-----------+----------+--------------+ FV Mid   Full                                                        +---------+---------------+---------+-----------+----------+--------------+ FV DistalFull                                                        +---------+---------------+---------+-----------+----------+--------------+ PFV      Full                                                        +---------+---------------+---------+-----------+----------+--------------+ POP      None           No       No                   Acute          +---------+---------------+---------+-----------+----------+--------------+ PTV      Partial                                      Acute          +---------+---------------+---------+-----------+----------+--------------+ PERO     None                                         Acute          +---------+---------------+---------+-----------+----------+--------------+     Summary: RIGHT: - No evidence of common femoral vein obstruction.  LEFT: - Findings consistent with acute deep vein thrombosis involving the left popliteal vein, left posterior tibial veins, and left peroneal veins. - No cystic structure found in the popliteal fossa.  *See table(s) above for measurements and observations. Electronically signed by  Deitra Mayo MD on 12/27/2019 at 5:43:22 PM.    Final     Chart has been reviewed    Assessment/Plan   54 y.o. male with medical history significant of Colon CA, deaf, asthma, s/p R colectomy    Admitted for  Submassive PE with right heart strain and troponin elevation  Present on Admission: . Pulmonary embolism ([facility]) - Admit to  stepdown Initiate heparin  drip  Would likely benefit from case manager consult for long term anticoagulation Hold home blood pressure medications avoid hypotension Cycle cardiac enzymes Order echogram and lower extremity Dopplers  Most likely risk factors for hypercoagulable state being  malignancy    Given hypotension and submassive PE [facility] consulted a per PCCM patient not a candidate for tPA at this time  . Elevated troponin - in the setting of PE, indicative of poorer outcomes will monitor in stepdown obtain echo  . Chronic asthma - stable cont home meds  . Colon cancer ([place]) - ONCOLOGY DR. Sherryl is awre as he sent pt to ER  . DVT (deep venous thrombosis) ([facility]) - on heparin   Other plan as per orders.  DVT prophylaxis: heparin    Code Status:    Code Status: Prior FULL CODE   as per patient   I had personally discussed CODE STATUS with patient     Family Communication:   Family not at  Bedside   Patient is deaf communicated through writing all questions answered  Disposition Plan:    To home once workup is complete and patient is stable   Following barriers for discharge:                           On PO anticoagualiton                             Will need to be able to tolerate PO                                             Consults called:  Pulmonology is aware  Admission status:  ED Disposition    ED Disposition Condition [place]: [place] [[id number]]  Level of Care: Stepdown [14]  Admit to SDU based on following criteria: Hemodynamic compromise or significant risk of instability:   Patient requiring short term acute titration and management of vasoactive drips, and invasive monitoring (i.e., CVP and Arterial line).  Covid Evaluation: Asymptomatic Screening Protocol (No Symptoms)  Diagnosis: Pulmonary embolism ([place]) [468032]  Admitting Physician: Toy Baker [[id number]]  Attending Physician: Toy Baker [[id number]]       Obs   Level of care        SDU tele indefinitely please discontinue once patient no longer qualifies COVID-19 Labs    Lab Results  Component Value Date   [place] NEGATIVE 09/16/2019     Precautions: admitted as   asymptomatic screening protocol   PPE: Used by the provider:   P100  eye Goggles,  Gloves   Alexander Reeves 12/27/2019, 10:15 PM    [facility]     after 2 AM please page floor coverage PA If 7AM-7PM, please contact the day team taking care of the patient using [email or web address]   Patient was evaluated in the context of the global COVID-19 pandemic, which necessitated consideration that the patient might be at risk for infection with the SARS-CoV-2 virus that causes COVID-19. Institutional protocols and algorithms that pertain to the evaluation of patients at risk for COVID-19 are in a state of rapid change based on information released by regulatory bodies including the CDC and federal and state organizations. These policies and algorithms were followed during the patient's care.

## 2019-12-27 NOTE — Progress Notes (Addendum)
Case and imaging reviewed.  No syncopal symptoms.  Send trop/SNP.  Would do heparin alone for now, if any hypotension or worsening clinical status, would do 100mg  systemic TPA.  Will get head CT to rule out any metastases to brain which would be contraindication to lytics (please reach out if mets are found).  Strict bedrest x 24 h.  Please call if needed this evening, I will discuss with Dr. Lake Bells.  Erskine Emery MD PCCM

## 2019-12-27 NOTE — Progress Notes (Signed)
ANTICOAGULATION CONSULT NOTE - Initial Consult  Pharmacy Consult for IV heparin Indication: DVT/PE  Allergies  Allergen Reactions  . Cheese Shortness Of Breath and Swelling    Swelling of the throat  . Eggs Or Egg-Derived Products Shortness Of Breath and Swelling    Swelling of the throat  . Penicillins Anaphylaxis    Has patient had a PCN reaction causing immediate rash, facial/tongue/throat swelling, SOB or lightheadedness with hypotension: Yes Has patient had a PCN reaction causing severe rash involving mucus membranes or skin necrosis: Unknown Has patient had a PCN reaction that required hospitalization: Unknown Has patient had a PCN reaction occurring within the last 10 years: Unknown If all of the above answers are "NO", then may proceed with Cephalosporin use.   . Gemfibrozil Nausea Only    Patient Measurements:    Height: '5\' 10"'  Weight: 63.5 kg Heparin Dosing Weight: actual body weight   Vital Signs: Temp: 99.6 F (37.6 C) (12/14 1554) Temp Source: Tympanic (12/14 1554) BP: 115/100 (12/14 1646) Pulse Rate: 110 (12/14 1646)  Labs: Recent Labs    12/27/19 1130  HGB 11.9*  HCT 37.9*  PLT 211  CREATININE 1.52*    Estimated Creatinine Clearance: 51.1 mL/min (A) (by C-G formula based on SCr of 1.52 mg/dL (H)).   Medical History: Past Medical History:  Diagnosis Date  . Arthritis    KNEES  . Asthma   . Deaf   . GERD (gastroesophageal reflux disease)    OCC  . 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 Scrotal cyst   . Thyroid disease   . Urinary frequency    OCC    Assessment: 73 y/oM with PMH of colon cancer sent from [place] with concern for chest pain and acute onset dyspnea. CTa chest + for PE with right heart strain and LE venous duplex + for LLE DVT. Pharmacy consulted for IV heparin dosing. Patient not on anticoagulants PTA. CBC: Hgb 11.9, Pltc WNL.   Goal of Therapy:  Heparin level 0.3-0.7 units/ml Monitor platelets by  anticoagulation protocol: Yes   Plan:   Baseline aPTT, PT/INR  Heparin 2150 units IV bolus x 1, then start heparin infusion at 1300 units/hr  Heparin level 6 hours after initiation  Daily CBC, heparin level  Monitor closely for s/sx of bleeding   Lindell Spar, PharmD, BCPS Clinical Pharmacist 12/27/2019,4:50 PM

## 2019-12-27 NOTE — ED Notes (Signed)
Interpreter and provider at pt bedside obtaining history.

## 2019-12-27 NOTE — ED Provider Notes (Signed)
[facility] DEPT Provider Note   CSN: [id number] Arrival date & time: 12/27/19  1614     History No chief complaint on file.   Alexander ENDICOTT Sr. is a 54 y.o. male.  HPI    Patient presents from our affiliated cancer center with concern for chest pain.  History is obtained by the patient and chart review from cancer center notes, including from earlier today. Seems as though discomfort in the left chest and left calf has been present for about 3 to 4 days, but today he developed dyspnea.  No cough, no fever.  With these concerns he went to oncology office, there had CT scan performed. Now, on reviewing his EMR, radiology studies, the patient is found to have pulmonary embolism. He was sent here for further evaluation. Patient is receiving ongoing therapy for his metastatic colon cancer, with interval history from his cancer clinic note as below:  History is obtained with the assistance of a sign language interpreter, and with the assistance of the patient's cancer center nurse was at bedside.  INTERVAL HISTORY:    Alexander Reeves completed another cycle of FOLFIRI/Avastin 12/13/2019.  He reports abdominal cramping and nausea/vomiting on the day of chemotherapy.  No further nausea and vomiting.  No diarrhea. He complains of anterior chest discomfort and acute on set dyspnea beginning this morning.  He has noted discomfort of the left calf for the past 3-4 days.  No cough or fever.  Past Medical History:  Diagnosis Date  . Arthritis    KNEES  . Asthma   . Deaf   . GERD (gastroesophageal reflux disease)    OCC  . 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 Scrotal cyst   . Thyroid disease   . Urinary frequency    OCC    Patient Active Problem List   Diagnosis Date Noted  . Goals of care, counseling/discussion 10/21/2019  . Anastomotic ulcer   . Polyp of colon   . Febrile neutropenia ([place]) 02/08/2019  . Port-A-Cath in place 01/11/2019  .  Ileus, postoperative ([place])   . Colon cancer ([place]) 11/19/2018  . Malignant neoplasm of ascending colon ([place])   . Abdominal pain 11/18/2018  . Abnormal CT scan, colon   . Chronic asthma 09/01/2016  . Chronic asthma, severe persistent, with acute exacerbation 08/10/2016    Past Surgical History:  Procedure Laterality Date  . BIOPSY  11/19/2018   Procedure: BIOPSY;  Surgeon: Thornton Park, MD;  Location: [place];  Service: Gastroenterology;;  . BIOPSY  09/20/2019   Procedure: BIOPSY;  Surgeon: Thornton Park, MD;  Location: [facility];  Service: Gastroenterology;;  . BLADDER SURGERY  YRS AGO  . COLONOSCOPY WITH PROPOFOL N/A 11/19/2018   Procedure: COLONOSCOPY WITH PROPOFOL;  Surgeon: Thornton Park, MD;  Location: [place];  Service: Gastroenterology;  Laterality: N/A;  . COLONOSCOPY WITH PROPOFOL N/A 09/20/2019   Procedure: COLONOSCOPY WITH PROPOFOL;  Surgeon: Thornton Park, MD;  Location: [facility];  Service: Gastroenterology;  Laterality: N/A;  . EPIDIDYMECTOMY Right 03/22/2018   Procedure: SCROTAL EXPLORATION RIGHT  PARTIAL EPIDIDYMECTOMY;  Surgeon: [name], MD;  Location: [facility];  Service: Urology;  Laterality: Right;  . HEMORRHOID SURGERY  WHEN YOUNG  . LAPAROTOMY N/A 11/19/2018   Procedure: EXPLORATORY LAPAROTOMY;  Surgeon: Rolm Bookbinder, MD;  Location: [place];  Service: General;  Laterality: N/A;  . NO PAST SURGERIES    . PARTIAL COLECTOMY Right 11/19/2018   Procedure:  PARTIAL COLECTOMY;  Surgeon: Rolm Bookbinder, MD;  Location: [place];  Service: General;  Laterality: Right;  . POLYPECTOMY  09/20/2019   Procedure: POLYPECTOMY;  Surgeon: Thornton Park, MD;  Location: [facility];  Service: Gastroenterology;;  . [name] PLACEMENT Right 12/23/2018   Procedure: INSERTION PORT-A-CATH WITH ULTRASOUND GUIDANCE;  Surgeon: Rolm Bookbinder, MD;  Location: [place];  Service: General;  Laterality: Right;  .  SUBMUCOSAL TATTOO INJECTION  11/19/2018   Procedure: SUBMUCOSAL TATTOO INJECTION;  Surgeon: Thornton Park, MD;  Location: [facility];  Service: Gastroenterology;;  . [name]       Family History  Problem Relation Age of Onset  . Heart disease Father   . Colon cancer Neg Hx   . Esophageal cancer Neg Hx   . Rectal cancer Neg Hx   . Stomach cancer Neg Hx     Social History   Tobacco Use  . Smoking status: Former Smoker    Types: Cigarettes    Quit date: 06/24/2016    Years since quitting: 3.5  . Smokeless tobacco: Never Used  Vaping Use  . Vaping Use: Never used  Substance Use Topics  . Alcohol use: Yes    Comment: OCC  . Drug use: Yes    Types: Marijuana    Comment: weekends (last fri and sat)    Home Medications Prior to Admission medications   Medication Sig Start Date End Date Taking? Authorizing Provider  albuterol (PROVENTIL) (2.5 MG/3ML) 0.083% nebulizer solution Take 3 mLs (2.5 mg total) by nebulization every 6 (six) hours as needed for wheezing or shortness of breath. 10/03/19 10/02/20  Ladell Pier, MD  albuterol (VENTOLIN HFA) 108 (90 Base) MCG/ACT inhaler Inhale 2 puffs into the lungs every 4 (four) hours as needed for wheezing or shortness of breath. 12/05/19   Ladell Pier, MD  ciclopirox (PENLAC) 8 % solution Apply topically at bedtime. Apply over nail and surrounding skin. Apply daily over previous coat. After seven (7) days, may remove with alcohol and continue cycle. Patient not taking: Reported on 12/27/2019 11/21/19   Criselda Peaches, DPM  fenofibrate (TRICOR) 145 MG tablet TAKE 1 TABLET (145 MG TOTAL) BY MOUTH DAILY. 10/12/19   [name], NP  fluticasone furoate-vilanterol (BREO ELLIPTA) 200-25 MCG/INH AEPB Inhale 1 puff into the lungs daily. Patient taking differently: Inhale 1 puff into the lungs daily as needed (shortness of breath). 09/26/19   [name], NP  gabapentin (NEURONTIN) 300 MG capsule Take 1 capsule  (300 mg total) by mouth at bedtime. 11/21/19   McDonald, Stephan Minister, DPM  ibuprofen (ADVIL) 600 MG tablet Take 1 tablet (600 mg total) by mouth every 8 (eight) hours as needed for moderate pain. 08/25/19   [name], NP  lidocaine-prilocaine (EMLA) cream Apply 1 application topically as needed. To feet Patient taking differently: Apply 1 application topically daily as needed (foot pain). 09/12/19   McDonald, Stephan Minister, DPM  loratadine (CLARITIN) 10 MG tablet Take 10 mg by mouth daily as needed for allergies.    [provider]  methocarbamol (ROBAXIN) 500 MG tablet Take 1 tablet (500 mg total) by mouth every 8 (eight) hours as needed for muscle spasms. 09/26/19   [name], NP  montelukast (SINGULAIR) 10 MG tablet Take 1 tablet (10 mg total) by mouth at bedtime. 08/25/19   [name], NP  Naphazoline HCl (CLEAR EYES OP) Place 1 drop into both eyes daily as needed (redness/dryness).  [provider]  pantoprazole (PROTONIX) 40 MG tablet Take 1 tablet (40 mg total) by mouth daily. 11/16/19   Ladell Pier, MD    Allergies    Cheese, Eggs or egg-derived products, Penicillins, and Gemfibrozil  Review of Systems   Review of Systems  Constitutional:       Per HPI, otherwise negative  HENT:       Per HPI, otherwise negative  Respiratory:       Per HPI, otherwise negative  Cardiovascular:       Per HPI, otherwise negative  Gastrointestinal: Negative for vomiting.  Endocrine:       Negative aside from HPI  Genitourinary:       Neg aside from HPI   Musculoskeletal:       Per HPI, otherwise negative  Skin: Negative.   Allergic/Immunologic: Positive for immunocompromised state.  Neurological: Negative for syncope.    Physical Exam Updated Vital Signs There were no vitals taken for this visit.  Physical Exam Vitals and nursing note reviewed.  Constitutional:      General: He is not in acute distress.    Appearance: He is well-developed.  HENT:      Head: Normocephalic and atraumatic.  Eyes:     Extraocular Movements: EOM normal.     Conjunctiva/sclera: Conjunctivae normal.  Cardiovascular:     Rate and Rhythm: Regular rhythm. Tachycardia present.  Pulmonary:     Effort: Pulmonary effort is normal. Tachypnea present. No respiratory distress.     Breath sounds: No stridor.  Abdominal:     General: There is no distension.  Musculoskeletal:        General: No edema.  Skin:    General: Skin is warm and dry.  Neurological:     Mental Status: He is alert.     Comments: Patient tries to phonate briefly, has semiintelligible single words, otherwise communicates with assistance of a sign language interpreter.  Psychiatric:        Mood and Affect: Mood and affect normal.      ED Results / Procedures / Treatments   Labs (all labs ordered are listed, but only abnormal results are displayed) Labs Reviewed  COMPREHENSIVE METABOLIC PANEL - Abnormal; Notable for the following components:      Result Value   Glucose, Bld 104 (*)    Creatinine, Ser 1.33 (*)    All other components within normal limits  CBC WITH DIFFERENTIAL/PLATELET - Abnormal; Notable for the following components:   Hemoglobin 12.1 (*)    RDW 18.0 (*)    All other components within normal limits  BRAIN NATRIURETIC PEPTIDE - Abnormal; Notable for the following components:   B Natriuretic Peptide 243.0 (*)    All other components within normal limits  TROPONIN I (HIGH SENSITIVITY) - Abnormal; Notable for the following components:   Troponin I (High Sensitivity) 311 (*)    All other components within normal limits  RESP PANEL BY RT-PCR (FLU A&B, COVID) ARPGX2  APTT  PROTIME-INR  HEPARIN LEVEL (UNFRACTIONATED)  CBC  TROPONIN I (HIGH SENSITIVITY)  TROPONIN I (HIGH SENSITIVITY)    EKG Sinus tach, 104, abnormal T wave changes, inversions anteriorly, abnormal On monitor the patient has sinus tachycardia, rate variable, 100, 110, abnormal  Pulse oximetry 96% room  air normal    Radiology CT ANGIO CHEST PE W OR WO CONTRAST  Result Date: 12/27/2019 CLINICAL DATA:  History of colon cancer with acute onset of dyspnea and tachycardia. EXAM: CT ANGIOGRAPHY CHEST WITH  CONTRAST TECHNIQUE: Multidetector CT imaging of the chest was performed using the standard protocol during bolus administration of intravenous contrast. Multiplanar CT image reconstructions and MIPs were obtained to evaluate the vascular anatomy. CONTRAST:  188m OMNIPAQUE IOHEXOL 350 MG/ML SOLN COMPARISON:  Chest CT 09/29/2019 FINDINGS: Cardiovascular: The heart is within normal limits in size and stable. No pericardial effusion. The aorta is normal in caliber. No dissection or atherosclerotic calcifications. The branch vessels are patent. No definite coronary artery calcifications. The pulmonary arterial tree is fairly well opacified. There are significant central and peripheral filling defects consistent with sub massive pulmonary embolism. Evidence of right heart strain with flattening of the left ventricle. The RV LV ratio is 1.18. Mediastinum/Nodes: No mediastinal or hilar mass or adenopathy. The esophagus is grossly normal. Lungs/Pleura: No worrisome pulmonary nodules to suggest pulmonary metastatic disease. No infiltrates or effusions. No pulmonary edema. Upper Abdomen: Hepatic metastatic disease is again demonstrated. No significant change. Musculoskeletal: No significant bony findings.  Moderate scoliosis. Review of the MIP images confirms the above findings. IMPRESSION: 1. Positive for sub massive pulmonary embolism with CT evidence of right heart strain (RV/LV Ratio 1.18) consistent with at least submassive (intermediate risk) PE. The presence of right heart strain has been associated with an increased risk of morbidity and mortality. 2. Normal thoracic aorta. 3. No worrisome pulmonary nodules or infiltrates. 4. Hepatic metastatic disease. These results were called by telephone at the time of  interpretation on 12/27/2019 at 2:50 pm to provider Dr. SCarin Hocknurse, SManuela Schwartz Who verbally acknowledged these results. Electronically Signed   By: PMarijo SanesM.D.   On: 12/27/2019 14:50   VAS [place]OWER EXTREMITY VENOUS (DVT)  Result Date: 12/27/2019  Lower Venous DVT Study Indications: Swelling.  Risk Factors: Cancer. Comparison Study: No prior studies. Performing Technologist: GOliver HumRVT  Examination Guidelines: A complete evaluation includes B-mode imaging, spectral Doppler, color Doppler, and power Doppler as needed of all accessible portions of each vessel. Bilateral testing is considered an integral part of a complete examination. Limited examinations for reoccurring indications may be performed as noted. The reflux portion of the exam is performed with the patient in reverse Trendelenburg.  +-----+---------------+---------+-----------+----------+--------------+ RIGHTCompressibilityPhasicitySpontaneityPropertiesThrombus Aging +-----+---------------+---------+-----------+----------+--------------+ CFV  Full           Yes      Yes                                 +-----+---------------+---------+-----------+----------+--------------+   +---------+---------------+---------+-----------+----------+--------------+ LEFT     CompressibilityPhasicitySpontaneityPropertiesThrombus Aging +---------+---------------+---------+-----------+----------+--------------+ CFV      Full           Yes      Yes                                 +---------+---------------+---------+-----------+----------+--------------+ SFJ      Full                                                        +---------+---------------+---------+-----------+----------+--------------+ FV Prox  Full                                                        +---------+---------------+---------+-----------+----------+--------------+  FV Mid   Full                                                         +---------+---------------+---------+-----------+----------+--------------+ FV DistalFull                                                        +---------+---------------+---------+-----------+----------+--------------+ PFV      Full                                                        +---------+---------------+---------+-----------+----------+--------------+ POP      None           No       No                   Acute          +---------+---------------+---------+-----------+----------+--------------+ PTV      Partial                                      Acute          +---------+---------------+---------+-----------+----------+--------------+ PERO     None                                         Acute          +---------+---------------+---------+-----------+----------+--------------+     Summary: RIGHT: - No evidence of common femoral vein obstruction.  LEFT: - Findings consistent with acute deep vein thrombosis involving the left popliteal vein, left posterior tibial veins, and left peroneal veins. - No cystic structure found in the popliteal fossa.  *See table(s) above for measurements and observations.    Preliminary     Procedures Procedures (including critical care time)  Medications Ordered in ED Medications  heparin bolus via infusion 2,150 Units (2,150 Units Intravenous Bolus from Bag 12/27/19 1706)    Followed by  heparin ADULT infusion 100 units/mL (25000 units/244m sodium chloride 0.45%) (1,300 Units/hr Intravenous New Bag/Given 12/27/19 1705)  albuterol (PROVENTIL) (2.5 MG/3ML) 0.083% nebulizer solution 2.5 mg (has no administration in time range)  pneumococcal 23 valent vaccine (PNEUMOVAX-23) injection 0.5 mL (has no administration in time range)    ED Course  I have reviewed the triage vital signs and the nursing notes.  Pertinent labs & imaging results that were available during my care of the patient were reviewed by me and considered in my  medical decision making (see chart for details).    5:10 PM I discussed the patient's case with our critical care colleagues. Specifically given the patient's history of cancer, new sob massive PE, with right heart strain, patient will start heparin, and may be admitted to the IM team.  8:27 PM Labs notable for elevation in troponin, BNP, both consistent with right heart strain, pulmonary embolism.  I have discussed the patient's case with our internal medicine colleagues.  Patient is started heparin therapy, will require admission for further monitoring, management.   MDM Rules/Calculators/A&P MDM Number of Diagnoses or Management Options Acute deep vein thrombosis (DVT) of popliteal vein of left lower extremity ([place]): new, needed workup Acute pulmonary embolism with acute cor pulmonale, unspecified pulmonary embolism type [facility]): new, needed workup   Amount and/or Complexity of Data Reviewed Clinical lab tests: reviewed Tests in the radiology section of CPT: reviewed Tests in the medicine section of CPT: reviewed Discussion of test results with the performing providers: yes Decide to obtain previous medical records or to obtain history from someone other than the patient: yes Obtain history from someone other than the patient: yes Review and summarize past medical records: yes Discuss the patient with other providers: yes Independent visualization of images, tracings, or specimens: yes  Risk of Complications, Morbidity, and/or Mortality Presenting problems: high Diagnostic procedures: high Management options: high  Critical Care Total time providing critical care: 30-74 minutes (35)  Patient Progress Patient progress: stable  Final Clinical Impression(s) / ED Diagnoses Final diagnoses:  Acute pulmonary embolism with acute cor pulmonale, unspecified pulmonary embolism type ([place])  Acute deep vein thrombosis (DVT) of popliteal vein of left lower extremity ([place])      Carmin Muskrat, MD 12/27/19 2031

## 2019-12-27 NOTE — ED Notes (Addendum)
By request of [facility] heparin level drawn for 1900. It was expressed by this nurse that the order in the pts chart shows 2300 for the heparin level and not 1900. Hep level drawn and sent down to lab. This nurse called lab and informed them of the time change that was requested.  Addendum: This heparin level was related to pt in room 2. Lab made aware of redraw at 11 pm

## 2019-12-27 NOTE — ED Notes (Signed)
Pt transported to radiology, interpreter alongside pt

## 2019-12-27 NOTE — ED Notes (Signed)
Per susan rn pt presented to cancer center today for treatment of stage 4  Colon cancer. Pt endorsed mild chest pain and Sob that he stated began today. He also endorsed left leg swelling x 4 days per nurse. Ct angio obtained at which was positive for pulmonary embolism per report given by nurse. Pt is AOx4 but does need sign language interpreter.

## 2019-12-28 ENCOUNTER — Encounter (HOSPITAL_COMMUNITY): Payer: Self-pay | Admitting: Internal Medicine

## 2019-12-28 ENCOUNTER — Observation Stay (HOSPITAL_COMMUNITY): Payer: Medicare Other

## 2019-12-28 DIAGNOSIS — R778 Other specified abnormalities of plasma proteins: Secondary | ICD-10-CM | POA: Diagnosis not present

## 2019-12-28 DIAGNOSIS — Z880 Allergy status to penicillin: Secondary | ICD-10-CM | POA: Diagnosis not present

## 2019-12-28 DIAGNOSIS — E440 Moderate protein-calorie malnutrition: Secondary | ICD-10-CM | POA: Diagnosis present

## 2019-12-28 DIAGNOSIS — Z86010 Personal history of colonic polyps: Secondary | ICD-10-CM | POA: Diagnosis not present

## 2019-12-28 DIAGNOSIS — C182 Malignant neoplasm of ascending colon: Secondary | ICD-10-CM | POA: Diagnosis present

## 2019-12-28 DIAGNOSIS — D701 Agranulocytosis secondary to cancer chemotherapy: Secondary | ICD-10-CM | POA: Diagnosis present

## 2019-12-28 DIAGNOSIS — I82432 Acute embolism and thrombosis of left popliteal vein: Secondary | ICD-10-CM | POA: Diagnosis present

## 2019-12-28 DIAGNOSIS — Z9852 Vasectomy status: Secondary | ICD-10-CM | POA: Diagnosis not present

## 2019-12-28 DIAGNOSIS — I82402 Acute embolism and thrombosis of unspecified deep veins of left lower extremity: Secondary | ICD-10-CM | POA: Diagnosis not present

## 2019-12-28 DIAGNOSIS — Z87891 Personal history of nicotine dependence: Secondary | ICD-10-CM | POA: Diagnosis not present

## 2019-12-28 DIAGNOSIS — Z8249 Family history of ischemic heart disease and other diseases of the circulatory system: Secondary | ICD-10-CM | POA: Diagnosis not present

## 2019-12-28 DIAGNOSIS — H9190 Unspecified hearing loss, unspecified ear: Secondary | ICD-10-CM | POA: Diagnosis present

## 2019-12-28 DIAGNOSIS — R000 Tachycardia, unspecified: Secondary | ICD-10-CM | POA: Diagnosis not present

## 2019-12-28 DIAGNOSIS — Z888 Allergy status to other drugs, medicaments and biological substances status: Secondary | ICD-10-CM | POA: Diagnosis not present

## 2019-12-28 DIAGNOSIS — Z9049 Acquired absence of other specified parts of digestive tract: Secondary | ICD-10-CM | POA: Diagnosis not present

## 2019-12-28 DIAGNOSIS — Z20822 Contact with and (suspected) exposure to covid-19: Secondary | ICD-10-CM | POA: Diagnosis present

## 2019-12-28 DIAGNOSIS — Z7901 Long term (current) use of anticoagulants: Secondary | ICD-10-CM | POA: Diagnosis not present

## 2019-12-28 DIAGNOSIS — Z91012 Allergy to eggs: Secondary | ICD-10-CM | POA: Diagnosis not present

## 2019-12-28 DIAGNOSIS — I2609 Other pulmonary embolism with acute cor pulmonale: Secondary | ICD-10-CM | POA: Diagnosis present

## 2019-12-28 DIAGNOSIS — C779 Secondary and unspecified malignant neoplasm of lymph node, unspecified: Secondary | ICD-10-CM | POA: Diagnosis not present

## 2019-12-28 DIAGNOSIS — M7989 Other specified soft tissue disorders: Secondary | ICD-10-CM | POA: Diagnosis not present

## 2019-12-28 DIAGNOSIS — Z23 Encounter for immunization: Secondary | ICD-10-CM | POA: Diagnosis not present

## 2019-12-28 DIAGNOSIS — J45909 Unspecified asthma, uncomplicated: Secondary | ICD-10-CM

## 2019-12-28 DIAGNOSIS — N1832 Chronic kidney disease, stage 3b: Secondary | ICD-10-CM | POA: Diagnosis present

## 2019-12-28 DIAGNOSIS — I2699 Other pulmonary embolism without acute cor pulmonale: Secondary | ICD-10-CM | POA: Diagnosis not present

## 2019-12-28 DIAGNOSIS — I82492 Acute embolism and thrombosis of other specified deep vein of left lower extremity: Secondary | ICD-10-CM

## 2019-12-28 DIAGNOSIS — K219 Gastro-esophageal reflux disease without esophagitis: Secondary | ICD-10-CM | POA: Diagnosis present

## 2019-12-28 DIAGNOSIS — Z7951 Long term (current) use of inhaled steroids: Secondary | ICD-10-CM | POA: Diagnosis not present

## 2019-12-28 DIAGNOSIS — E079 Disorder of thyroid, unspecified: Secondary | ICD-10-CM | POA: Diagnosis present

## 2019-12-28 DIAGNOSIS — Z79899 Other long term (current) drug therapy: Secondary | ICD-10-CM | POA: Diagnosis not present

## 2019-12-28 DIAGNOSIS — Z91011 Allergy to milk products: Secondary | ICD-10-CM | POA: Diagnosis not present

## 2019-12-28 DIAGNOSIS — C189 Malignant neoplasm of colon, unspecified: Secondary | ICD-10-CM | POA: Diagnosis not present

## 2019-12-28 DIAGNOSIS — C787 Secondary malignant neoplasm of liver and intrahepatic bile duct: Secondary | ICD-10-CM | POA: Diagnosis present

## 2019-12-28 DIAGNOSIS — R309 Painful micturition, unspecified: Secondary | ICD-10-CM | POA: Diagnosis not present

## 2019-12-28 DIAGNOSIS — M79609 Pain in unspecified limb: Secondary | ICD-10-CM | POA: Diagnosis not present

## 2019-12-28 LAB — CBC WITH DIFFERENTIAL/PLATELET
Abs Immature Granulocytes: 0.06 10*3/uL (ref 0.00–0.07)
Basophils Absolute: 0.1 10*3/uL (ref 0.0–0.1)
Basophils Relative: 1 %
Eosinophils Absolute: 0.6 10*3/uL — ABNORMAL HIGH (ref 0.0–0.5)
Eosinophils Relative: 7 %
HCT: 37.8 % — ABNORMAL LOW (ref 39.0–52.0)
Hemoglobin: 11.5 g/dL — ABNORMAL LOW (ref 13.0–17.0)
Immature Granulocytes: 1 %
Lymphocytes Relative: 24 %
Lymphs Abs: 2 10*3/uL (ref 0.7–4.0)
MCH: 26.1 pg (ref 26.0–34.0)
MCHC: 30.4 g/dL (ref 30.0–36.0)
MCV: 85.9 fL (ref 80.0–100.0)
Monocytes Absolute: 0.8 10*3/uL (ref 0.1–1.0)
Monocytes Relative: 9 %
Neutro Abs: 5.1 10*3/uL (ref 1.7–7.7)
Neutrophils Relative %: 58 %
Platelets: 214 10*3/uL (ref 150–400)
RBC: 4.4 MIL/uL (ref 4.22–5.81)
RDW: 17.9 % — ABNORMAL HIGH (ref 11.5–15.5)
WBC: 8.5 10*3/uL (ref 4.0–10.5)
nRBC: 0 % (ref 0.0–0.2)

## 2019-12-28 LAB — HIV ANTIBODY (ROUTINE TESTING W REFLEX): HIV Screen 4th Generation wRfx: NONREACTIVE

## 2019-12-28 LAB — COMPREHENSIVE METABOLIC PANEL
ALT: 14 U/L (ref 0–44)
AST: 15 U/L (ref 15–41)
Albumin: 3.5 g/dL (ref 3.5–5.0)
Alkaline Phosphatase: 68 U/L (ref 38–126)
Anion gap: 10 (ref 5–15)
BUN: 9 mg/dL (ref 6–20)
CO2: 24 mmol/L (ref 22–32)
Calcium: 8.6 mg/dL — ABNORMAL LOW (ref 8.9–10.3)
Chloride: 105 mmol/L (ref 98–111)
Creatinine, Ser: 1.33 mg/dL — ABNORMAL HIGH (ref 0.61–1.24)
GFR, Estimated: >60 mL/min (ref >60)
Glucose, Bld: 90 mg/dL (ref 70–99)
Potassium: 3.9 mmol/L (ref 3.5–5.1)
Sodium: 139 mmol/L (ref 135–145)
Total Bilirubin: 0.4 mg/dL (ref 0.3–1.2)
Total Protein: 6.5 g/dL (ref 6.5–8.1)

## 2019-12-28 LAB — MAGNESIUM: Magnesium: 2.4 mg/dL (ref 1.7–2.4)

## 2019-12-28 LAB — HEPARIN LEVEL (UNFRACTIONATED)
Heparin Unfractionated: 0.71 IU/mL — ABNORMAL HIGH (ref 0.30–0.70)
Heparin Unfractionated: 1.8 IU/mL — ABNORMAL HIGH (ref 0.30–0.70)
Heparin Unfractionated: 0.82 IU/mL — ABNORMAL HIGH (ref 0.30–0.70)

## 2019-12-28 LAB — ECHOCARDIOGRAM COMPLETE
Height: 70 in
S' Lateral: 2.2 cm
Weight: 2241.6 oz

## 2019-12-28 LAB — RESP PANEL BY RT-PCR (FLU A&B, COVID) ARPGX2
Influenza A by PCR: NEGATIVE
Influenza B by PCR: NEGATIVE
SARS Coronavirus 2 by RT PCR: NEGATIVE

## 2019-12-28 LAB — MRSA PCR SCREENING: MRSA by PCR: NEGATIVE

## 2019-12-28 LAB — TROPONIN I (HIGH SENSITIVITY): Troponin I (High Sensitivity): 193 ng/L (ref <18)

## 2019-12-28 LAB — TSH: TSH: 1.838 u[IU]/mL (ref 0.350–4.500)

## 2019-12-28 LAB — PHOSPHORUS: Phosphorus: 3.5 mg/dL (ref 2.5–4.6)

## 2019-12-28 MED ORDER — CHLORHEXIDINE GLUCONATE CLOTH 2 % EX PADS
6.0000 | MEDICATED_PAD | Freq: Every day | CUTANEOUS | Status: DC
  Administered 2019-12-28 (×2): 6 via TOPICAL

## 2019-12-28 MED ORDER — RIVAROXABAN 15 MG PO TABS
15.0000 mg | ORAL_TABLET | Freq: Two times a day (BID) | ORAL | Status: DC
  Administered 2019-12-28 – 2019-12-30 (×4): 15 mg via ORAL
  Filled 2019-12-28 (×4): qty 1

## 2019-12-28 MED ORDER — RIVAROXABAN 20 MG PO TABS: 20.0000 mg | ORAL_TABLET | Freq: Every day | ORAL | Status: DC

## 2019-12-28 MED ORDER — HEPARIN (PORCINE) 25000 UT/250ML-% IV SOLN: 1050.0000 [IU]/h | INTRAVENOUS | Status: DC

## 2019-12-28 NOTE — Progress Notes (Signed)
NAME:  Alexander Jumper., MRN:  [id number], DOB:  1965/05/16, LOS: 0 ADMISSION DATE:  12/27/2019, CONSULTATION DATE:  12/14 REFERRING MD:  Vanita Panda, CHIEF COMPLAINT:  Dyspnea   Brief History   54 year old male with metastatic colon cancer admitted from the [facility] emergency department on 12/27/2019 in the setting of a submassive pulmonary embolism.  Past Medical History  Metastatic Moderately differentiated adenocarcinoma of ascending colon (stage IV) s/p R colectomy; has been on chemotherapy (initially FOLFOX and Udenyca 12/2018 through 06/2019): Completed Cycle 4 of FOLFIRI/Avastin 11/2019 Deaf Asthma Neutropenia secondary to chemotherapy  Significant Hospital Events     Consults:    Procedures:    Significant Diagnostic Tests:  12/14 CT angiogram chest images independently reviewed showing a saddle pulmonary embolism, RV strain, bilateral extension of pulmonary emboli, no significant pulmonary parenchymal abnormality 12/14 vasc ultrasound> left popliteal DVT 12/14 CT head > NAICP, no met 57/26 Echo> RV systolic function moderately reduced, dilated, LVEF 60-65%   Micro Data:  12/14 SARS-CoV-2 negative  Antimicrobials:    Interim history/subjective:  Feels better Breathing better Still has some pain in his left lower leg   Objective   Blood pressure 98/75, pulse 92, temperature 98.3 F (36.8 C), temperature source Oral, resp. rate 19, SpO2 97 %.        Intake/Output Summary (Last 24 hours) at 12/28/2019 0821 Last data filed at 12/28/2019 0816 Gross per 24 hour  Intake 1131.66 ml  Output 275 ml  Net 856.66 ml   There were no vitals filed for this visit.  Examination:  General:  Resting comfortably in bed HENT: NCAT OP clear PULM: CTA B, normal effort CV: RRR, no mgr GI: BS+, soft, nontender MSK: normal bulk and tone Neuro: awake, alert, no distress, MAEW   Resolved Hospital Problem list     Assessment & Plan:  Submassive pulmonary  embolism with evidence of RV strain based on elevated troponin and BNP: Currently hemodynamically stable.  12/15 looks much better.  HR down, BP improved. Tele monitoring OK to transfer to progressive care OK to transition from heparin to Xarelto Ambulate Would maintain on Xarelto indefinitely Recommend repeat echocardiogram in 6 months   F/u echo F/U LE doppler Change to Xarelto today  Wheezing Prn albuterol ordered  Best practice (evaluated daily)   Per [facility]  Labs   CBC: Recent Labs  Lab 12/27/19 1130 12/27/19 1645 12/28/19 0557  WBC 7.8 9.2 8.5  NEUTROABS 5.3 6.8 5.1  HGB 11.9* 12.1* 11.5*  HCT 37.9* 39.5 37.8*  MCV 84.8 86.6 85.9  PLT 211 215 203    Basic Metabolic Panel: Recent Labs  Lab 12/27/19 1130 12/27/19 1645 12/28/19 0557  NA 140 136 139  K 4.0 4.1 3.9  CL 108 105 105  CO2 '27 24 24  ' GLUCOSE 104* 104* 90  BUN '12 12 9  ' CREATININE 1.52* 1.33* 1.33*  CALCIUM 9.5 8.9 8.6*  MG  --   --  2.4  PHOS  --   --  3.5   GFR: Estimated Creatinine Clearance: 58.4 mL/min (A) (by C-G formula based on SCr of 1.33 mg/dL (H)). Recent Labs  Lab 12/27/19 1130 12/27/19 1645 12/28/19 0557  WBC 7.8 9.2 8.5    Liver Function Tests: Recent Labs  Lab 12/27/19 1130 12/27/19 1645 12/28/19 0557  AST '17 19 15  ' ALT '16 16 14  ' ALKPHOS 84 80 68  BILITOT 0.5 0.4 0.4  PROT 7.4 7.3 6.5  ALBUMIN 3.7 3.9 3.5   No  results for input(s): LIPASE, AMYLASE in the last 168 hours. No results for input(s): AMMONIA in the last 168 hours.  ABG    Component Value Date/Time   PHART 7.318 (L) 10/17/2018 0436   PCO2ART 45.1 10/17/2018 0436   PO2ART 95.0 10/17/2018 0436   HCO3 23.4 10/17/2018 0436   TCO2 25 10/17/2018 0436   ACIDBASEDEF 3.0 (H) 10/17/2018 0436   O2SAT 97.0 10/17/2018 0436     Coagulation Profile: Recent Labs  Lab 12/27/19 1645  INR 1.1    Cardiac Enzymes: No results for input(s): CKTOTAL, CKMB, CKMBINDEX, TROPONINI in the last 168  hours.  HbA1C: No results found for: HGBA1C  CBG: No results for input(s): GLUCAP in the last 168 hours.   Critical care time: n/a       Roselie Awkward, MD [facility] Pager: [phone] Cell: [phone] If no response, call [phone]

## 2019-12-28 NOTE — Plan of Care (Signed)
Patient will remain free of falls and/or injuries through hospital stay. Patient will be able to effectively communicate needs during stay.

## 2019-12-28 NOTE — Discharge Instructions (Signed)

## 2019-12-28 NOTE — Progress Notes (Addendum)
Initial Nutrition Assessment  DOCUMENTATION CODES:   Non-severe (moderate) malnutrition in context of chronic illness  INTERVENTION:   Boost Breeze po BID, each supplement provides 250 kcal and 9 grams of protein  Monitor for diet advancement   MVI with minerals  NUTRITION DIAGNOSIS:   Moderate Malnutrition related to chronic illness as evidenced by severe muscle depletion,moderate muscle depletion,mild fat depletion,mild muscle depletion.   GOAL:   Patient will meet greater than or equal to 90% of their needs   MONITOR:   PO intake,Labs,Weight trends,Diet advancement,Supplement acceptance  REASON FOR ASSESSMENT:   Malnutrition Screening Tool    ASSESSMENT:   Pt is a 54 y.o. male with PMH of colon CA stage IV s/p R colectomy undergoing chemotherapy (last chemo 11/30), asthma, and deaf. Pt presented after oncologist visit after LLE swelling, dyspnea, and tachycardia. Admitted for submassive PE with right heart strain.   Interpreter service via iPad used for pt interview. (Interpreter: Benjamine Mola [phone])   Pt provided brief meal history from during his admission. Unable to obtain extensive nutrition related information from pt as pt seemed uninterested in participating in Dietetic Intern interview. Noted that in MST assessment upon admission, pt reports he "has been vomiting a lot".   Pt stated he was hungry during RD visit and that he wanted two chocolate puddings. RD ordered for pt. Pt stated he had chicken broth and apple juice for breakfast. Pt stated he did not have lunch but per chart review, pt received tray of cream of potato soup, vanilla pudding, and orange gelatin. Per chart review, pt consumed 100% of two recorded meal records.   Per chart review, pt weight was 146.6 lbs in June 2021. Pt weight appears stable from June to September. Pt weight seems to fluctuate 5-8 lbs since October with his lowest weight reported during this admission of 140.1 lbs.   Labs  reviewed.   Medications reviewed and include: Protonix, Neurotonin    NUTRITION - FOCUSED PHYSICAL EXAM:  Flowsheet Row Most Recent Value  Orbital Region Mild depletion  Upper Arm Region Mild depletion  Thoracic and Lumbar Region Unable to assess  Buccal Region No depletion  Temple Region Moderate depletion  Clavicle Bone Region Moderate depletion  Clavicle and Acromion Bone Region Moderate depletion  Scapular Bone Region Mild depletion  Dorsal Hand Moderate depletion  Patellar Region Severe depletion  Anterior Thigh Region Moderate depletion  Posterior Calf Region Severe depletion  Edema (RD Assessment) None  Hair Reviewed  Eyes Reviewed  Mouth Unable to assess  Skin Reviewed  Nails Reviewed       Diet Order:   Diet Order            Diet full liquid Room service appropriate? Yes; Fluid consistency: Thin  Diet effective now                 EDUCATION NEEDS:   No education needs have been identified at this time  Skin:  Skin Assessment: Reviewed RN Assessment  Last BM:  PTA  Height:   Ht Readings from Last 1 Encounters:  12/28/19 5\' 10"  (1.778 m)    Weight:   Wt Readings from Last 1 Encounters:  12/28/19 63.5 kg    Ideal Body Weight:  75.5 kg  BMI:  Body mass index is 20.1 kg/m.  Estimated Nutritional Needs:   Kcal:  6269-4854  Protein:  105-120 grams  Fluid:  >2 L/day    Ronnald Nian, Dietetic Intern Pager: [phone] If unavailable: [phone]

## 2019-12-28 NOTE — Progress Notes (Signed)
Pharmacy - IV heparin >> Xarelto  Assessment:    Please see note from Leodis Sias, PharmD earlier today for full details. Briefly, 54 y.o. male on IV heparin for new PE/DVT. Transitioning to Xarelto tonight   Previous recent heparin level slightly high, but rate decreased shortly thereafter - will cancel upcoming level  TBW CrCl > 50 ml/min  CBC stable  No major DDI, although pt reports taking PRN NSAIDS at home  Plan:   Start Xarelto 15 mg PO bid x 21 days, followed by 20 mg PO daily thereafter  Stop heparin with first dose of [place] to provide Xarelto education and any appropriate coupons prior to discharge  Alexander Reeves, PharmD, BCPS [phone] 12/28/2019, 6:55 PM

## 2019-12-28 NOTE — Progress Notes (Signed)
  Echocardiogram 2D Echocardiogram has been performed.  Jennette Dubin 12/28/2019, 8:49 AM

## 2019-12-28 NOTE — Progress Notes (Signed)
PROGRESS NOTE    Alexander Reeves  BTD:[id number] DOB: 03/13/1965 DOA: 12/27/2019 PCP: Alexander Perna, NP   Brief Narrative: Taken from H&P. Alexander WHITENIGHT Sr. is a 54 y.o. male with medical history significant of Colon CA, deaf, asthma, s/p R colectomy, presented with worsening shortness of breath and tachycardia.  Patient presented to his oncologist yesterday with left lower extremity edema and shortness of breath.  She was sent to ED for concern of DVT and PE.  Venous Doppler studies were positive for left popliteal, posterior tibial and peroneal vein DVT and CTA with submassive pulmonary embolism with evidence of right heart strain.  Echocardiogram with moderately reduced right ventricular systolic function.  Patient maintained his saturation on room air. Pulmonary and oncology was consulted.   Subjective: Patient denies any more shortness of breath.  Patient is deaf and a sign language interpreter was used.  Continue to have some sore throat and mild nausea. He wants to have a mechanical thrombectomy, discussed with pulmonary and they does not think that it is necessary.  Assessment & Plan:   Active Problems:   Chronic asthma   Colon cancer (HCC)   Elevated troponin   DVT (deep venous thrombosis) (HCC)   Pulmonary embolism (HCC)   Malnutrition of moderate degree  DVT/PE.  Risk factors of being hypercoagulable with history of malignancy. Initially started on heparin infusion.  Not a candidate for TPA or mechanical thrombectomy at this time. After discussion with PCCM-we will transitioned him to Xarelto.  Elevated troponin.  With a flat curve most likely secondary to PE and demand ischemia.  History of asthma.  No acute concern. -Continue home bronchodilators.  Colon cancer.  Oncology is aware. -He will continue his chemo after discharge.  Objective: Vitals:   12/28/19 1200 12/28/19 1400 12/28/19 1600 12/28/19 1800  BP: 98/82 106/83 103/77 98/83  Pulse: 86 90 92    Resp: (!) 25 18 20 18   Temp:   98.8 F (37.1 C)   TempSrc: Oral  Oral   SpO2: (!) 81% 97% 98%   Weight:      Height:        Intake/Output Summary (Last 24 hours) at 12/28/2019 1829 Last data filed at 12/28/2019 1827 Gross per 24 hour  Intake 1798.12 ml  Output 975 ml  Net 823.12 ml   Filed Weights   12/28/19 1035  Weight: 63.5 kg    Examination:  General exam: Appears calm and comfortable  Respiratory system: Clear to auscultation. Respiratory effort normal. Cardiovascular system: S1 & S2 heard, RRR.  Gastrointestinal system: Soft, nontender, nondistended, bowel sounds positive. Central nervous system: Alert and oriented. No focal neurological deficits. Extremities: No edema, no cyanosis, pulses intact and symmetrical.  Mild left calf tenderness. Psychiatry: Judgement and insight appear normal. Mood & affect appropriate.    DVT prophylaxis: Xarelto Code Status: Full Family Communication: Discussed with patient Disposition Plan:  Status is: Inpatient  Remains inpatient appropriate because:Inpatient level of care appropriate due to severity of illness   Dispo: The patient is from: Home              Anticipated d/c is to: Home              Anticipated d/c date is: 1 day              Patient currently is not medically stable to d/c.   Consultants:   PCCM  Procedures:  Antimicrobials:   Data Reviewed: I have personally  reviewed following labs and imaging studies  CBC: Recent Labs  Lab 12/27/19 1130 12/27/19 1645 12/28/19 0557  WBC 7.8 9.2 8.5  NEUTROABS 5.3 6.8 5.1  HGB 11.9* 12.1* 11.5*  HCT 37.9* 39.5 37.8*  MCV 84.8 86.6 85.9  PLT 211 215 570   Basic Metabolic Panel: Recent Labs  Lab 12/27/19 1130 12/27/19 1645 12/28/19 0557  NA 140 136 139  K 4.0 4.1 3.9  CL 108 105 105  CO2 27 24 24   GLUCOSE 104* 104* 90  BUN 12 12 9   CREATININE 1.52* 1.33* 1.33*  CALCIUM 9.5 8.9 8.6*  MG  --   --  2.4  PHOS  --   --  3.5   GFR: Estimated  Creatinine Clearance: 58.4 mL/min (A) (by C-G formula based on SCr of 1.33 mg/dL (H)). Liver Function Tests: Recent Labs  Lab 12/27/19 1130 12/27/19 1645 12/28/19 0557  AST 17 19 15   ALT 16 16 14   ALKPHOS 84 80 68  BILITOT 0.5 0.4 0.4  PROT 7.4 7.3 6.5  ALBUMIN 3.7 3.9 3.5   No results for input(s): LIPASE, AMYLASE in the last 168 hours. No results for input(s): AMMONIA in the last 168 hours. Coagulation Profile: Recent Labs  Lab 12/27/19 1645  INR 1.1   Cardiac Enzymes: No results for input(s): CKTOTAL, CKMB, CKMBINDEX, TROPONINI in the last 168 hours. BNP (last 3 results) No results for input(s): PROBNP in the last 8760 hours. HbA1C: No results for input(s): HGBA1C in the last 72 hours. CBG: No results for input(s): GLUCAP in the last 168 hours. Lipid Profile: No results for input(s): CHOL, HDL, LDLCALC, TRIG, CHOLHDL, LDLDIRECT in the last 72 hours. Thyroid Function Tests: Recent Labs    12/28/19 0557  TSH 1.838   Anemia Panel: No results for input(s): VITAMINB12, FOLATE, FERRITIN, TIBC, IRON, RETICCTPCT in the last 72 hours. Sepsis Labs: No results for input(s): PROCALCITON, LATICACIDVEN in the last 168 hours.  Recent Results (from the past 240 hour(s))  SARS Coronavirus 2 (TAT 6-24 hrs)     Status: None   Collection Time: 12/27/19  3:31 PM   Specimen: Nasopharyngeal Swab  Result Value Ref Range Status   SARS Coronavirus 2 NEGATIVE NEGATIVE Final    Comment: (NOTE) SARS-CoV-2 target nucleic acids are NOT DETECTED.  The SARS-CoV-2 RNA is generally detectable in upper and lower respiratory specimens during the acute phase of infection. Negative results do not preclude SARS-CoV-2 infection, do not rule out co-infections with other pathogens, and should not be used as the sole basis for treatment or other patient management decisions. Negative results must be combined with clinical observations, patient history, and epidemiological information. The  expected result is Negative.  Fact Sheet for Patients: SugarRoll.be  Fact Sheet for Healthcare Providers: https://www.woods-mathews.com/  This test is not yet approved or cleared by the [place] FDA and  has been authorized for detection and/or diagnosis of SARS-CoV-2 by FDA under an Emergency Use Authorization (EUA). This EUA will remain  in effect (meaning this test can be used) for the duration of the COVID-19 declaration under Se ction 564(b)(1) of the Act, 21 U.S.C. section 360bbb-3(b)(1), unless the authorization is terminated or revoked sooner.  Performed at [facility], [place] [address], [place], [place] [address]   Resp Panel by RT-PCR (Flu A&B, Covid) Nasopharyngeal Swab     Status: None   Collection Time: 12/27/19  9:48 PM   Specimen: Nasopharyngeal Swab; Nasopharyngeal(NP) swabs in vial transport medium  Result Value  Ref Range Status   SARS Coronavirus 2 by RT PCR NEGATIVE NEGATIVE Final    Comment: (NOTE) SARS-CoV-2 target nucleic acids are NOT DETECTED.  The SARS-CoV-2 RNA is generally detectable in upper respiratory specimens during the acute phase of infection. The lowest concentration of SARS-CoV-2 viral copies this assay can detect is 138 copies/mL. A negative result does not preclude SARS-Cov-2 infection and should not be used as the sole basis for treatment or other patient management decisions. A negative result may occur with  improper specimen collection/handling, submission of specimen other than nasopharyngeal swab, presence of viral mutation(s) within the areas targeted by this assay, and inadequate number of viral copies(<138 copies/mL). A negative result must be combined with clinical observations, patient history, and epidemiological information. The expected result is Negative.  Fact Sheet for Patients:  EntrepreneurPulse.com.au  Fact Sheet for Healthcare Providers:   IncredibleEmployment.be  This test is no t yet approved or cleared by the [place] FDA and  has been authorized for detection and/or diagnosis of SARS-CoV-2 by FDA under an Emergency Use Authorization (EUA). This EUA will remain  in effect (meaning this test can be used) for the duration of the COVID-19 declaration under Section 564(b)(1) of the Act, 21 U.S.C.section 360bbb-3(b)(1), unless the authorization is terminated  or revoked sooner.       Influenza A by PCR NEGATIVE NEGATIVE Final   Influenza B by PCR NEGATIVE NEGATIVE Final    Comment: (NOTE) The Xpert Xpress SARS-CoV-2/FLU/RSV plus assay is intended as an aid in the diagnosis of influenza from Nasopharyngeal swab specimens and should not be used as a sole basis for treatment. Nasal washings and aspirates are unacceptable for Xpert Xpress SARS-CoV-2/FLU/RSV testing.  Fact Sheet for Patients: EntrepreneurPulse.com.au  Fact Sheet for Healthcare Providers: IncredibleEmployment.be  This test is not yet approved or cleared by the [place] FDA and has been authorized for detection and/or diagnosis of SARS-CoV-2 by FDA under an Emergency Use Authorization (EUA). This EUA will remain in effect (meaning this test can be used) for the duration of the COVID-19 declaration under Section 564(b)(1) of the Act, 21 U.S.C. section 360bbb-3(b)(1), unless the authorization is terminated or revoked.  Performed at [facility], [place] [address], [place], [place] [address]   MRSA PCR Screening     Status: None   Collection Time: 12/28/19  2:00 AM   Specimen: Nasopharyngeal  Result Value Ref Range Status   MRSA by PCR NEGATIVE NEGATIVE Final    Comment:        The GeneXpert MRSA Assay (FDA approved for NASAL specimens only), is one component of a comprehensive MRSA colonization surveillance program. It is not intended to diagnose MRSA infection nor  to guide or monitor treatment for MRSA infections. Performed at [facility], [place] [address], [place], [place] [address]      Radiology Studies: CT HEAD WO CONTRAST  Result Date: 12/27/2019 CLINICAL DATA:  Metastatic colon cancer.  Staging examination EXAM: CT HEAD WITHOUT CONTRAST TECHNIQUE: Contiguous axial images were obtained from the base of the skull through the vertex without intravenous contrast. COMPARISON:  None. FINDINGS: Brain: Normal anatomic configuration. No abnormal intra or extra-axial mass lesion or fluid collection. No abnormal mass effect or midline shift. No evidence of acute intracranial hemorrhage or infarct. Ventricular size is normal. Cerebellum unremarkable. Vascular: Unremarkable Skull: Intact Sinuses/Orbits: Paranasal sinuses are clear. Orbits are unremarkable. Other: Mastoid air cells and middle ear cavities are clear. IMPRESSION: Normal examination.  No evidence  of intracranial metastatic disease. Electronically Signed   By: Fidela Salisbury MD   On: 12/27/2019 19:06   CT ANGIO CHEST PE W OR WO CONTRAST  Result Date: 12/27/2019 CLINICAL DATA:  History of colon cancer with acute onset of dyspnea and tachycardia. EXAM: CT ANGIOGRAPHY CHEST WITH CONTRAST TECHNIQUE: Multidetector CT imaging of the chest was performed using the standard protocol during bolus administration of intravenous contrast. Multiplanar CT image reconstructions and MIPs were obtained to evaluate the vascular anatomy. CONTRAST:  177mL OMNIPAQUE IOHEXOL 350 MG/ML SOLN COMPARISON:  Chest CT 09/29/2019 FINDINGS: Cardiovascular: The heart is within normal limits in size and stable. No pericardial effusion. The aorta is normal in caliber. No dissection or atherosclerotic calcifications. The branch vessels are patent. No definite coronary artery calcifications. The pulmonary arterial tree is fairly well opacified. There are significant central and peripheral filling defects consistent with sub  massive pulmonary embolism. Evidence of right heart strain with flattening of the left ventricle. The RV LV ratio is 1.18. Mediastinum/Nodes: No mediastinal or hilar mass or adenopathy. The esophagus is grossly normal. Lungs/Pleura: No worrisome pulmonary nodules to suggest pulmonary metastatic disease. No infiltrates or effusions. No pulmonary edema. Upper Abdomen: Hepatic metastatic disease is again demonstrated. No significant change. Musculoskeletal: No significant bony findings.  Moderate scoliosis. Review of the MIP images confirms the above findings. IMPRESSION: 1. Positive for sub massive pulmonary embolism with CT evidence of right heart strain (RV/LV Ratio 1.18) consistent with at least submassive (intermediate risk) PE. The presence of right heart strain has been associated with an increased risk of morbidity and mortality. 2. Normal thoracic aorta. 3. No worrisome pulmonary nodules or infiltrates. 4. Hepatic metastatic disease. These results were called by telephone at the time of interpretation on 12/27/2019 at 2:50 pm to provider Dr. Carin Hock nurse, Manuela Schwartz, Who verbally acknowledged these results. Electronically Signed   By: Marijo Sanes M.D.   On: 12/27/2019 14:50   ECHOCARDIOGRAM COMPLETE  Result Date: 12/28/2019    ECHOCARDIOGRAM REPORT   Patient Name:   JERIMYAH VANDUNK Sr. Date of Exam: 12/28/2019 Medical Rec #:  [id number]            Height:       70.0 in Accession #:    [id number]           Weight:       140.1 lb Date of Birth:  [birth year]            BSA:          1.794 m Patient Age:    69 years             BP:           107/74 mmHg Patient Gender: M                    HR:           95 bpm. Exam Location:  Inpatient Procedure: 2D Echo Indications:    Pulmonary Embolism; Dyspnea R06.00  History:        Patient has no prior history of Echocardiogram examinations.  Sonographer:    Mikki Santee RDCS (AE) Referring Phys: [place]  1. Left ventricular ejection  fraction, by estimation, is 60 to 65%. The left ventricle has normal function. The left ventricle has no regional wall motion abnormalities. Left ventricular diastolic function could not be evaluated.  2. Right ventricular systolic function is moderately reduced. The right ventricular size is normal.  A Prominent moderator band is visualized. Tricuspid regurgitation signal is inadequate for assessing PA pressure.  3. Right atrial size was mildly dilated.  4. The pericardial effusion is posterior to the left ventricle.  5. The mitral valve is degenerative. No evidence of mitral valve regurgitation. No evidence of mitral stenosis.  6. The aortic valve was not well visualized. Aortic valve regurgitation is not visualized. Mild aortic valve sclerosis is present, with no evidence of aortic valve stenosis.  7. The inferior vena cava is normal in size with greater than 50% respiratory variability, suggesting right atrial pressure of 3 mmHg. FINDINGS  Left Ventricle: Left ventricular ejection fraction, by estimation, is 60 to 65%. The left ventricle has normal function. The left ventricle has no regional wall motion abnormalities. The left ventricular internal cavity size was normal in size. There is  no left ventricular hypertrophy. Left ventricular diastolic function could not be evaluated. Right Ventricle: The right ventricular size is normal. No increase in right ventricular wall thickness. Right ventricular systolic function is moderately reduced. Tricuspid regurgitation signal is inadequate for assessing PA pressure. Left Atrium: Left atrial size was normal in size. Right Atrium: Right atrial size was mildly dilated. Pericardium: Trivial pericardial effusion is present. The pericardial effusion is posterior to the left ventricle. Mitral Valve: The mitral valve is degenerative in appearance. There is mild calcification of the mitral valve leaflet(s). Mild mitral annular calcification. No evidence of mitral valve  regurgitation. No evidence of mitral valve stenosis. Tricuspid Valve: The tricuspid valve is normal in structure. Tricuspid valve regurgitation is mild . No evidence of tricuspid stenosis. Aortic Valve: The aortic valve was not well visualized. Aortic valve regurgitation is not visualized. Mild aortic valve sclerosis is present, with no evidence of aortic valve stenosis. Pulmonic Valve: The pulmonic valve was normal in structure. Pulmonic valve regurgitation is not visualized. No evidence of pulmonic stenosis. Aorta: The aortic root is normal in size and structure. Venous: The inferior vena cava is normal in size with greater than 50% respiratory variability, suggesting right atrial pressure of 3 mmHg. IAS/Shunts: No atrial level shunt detected by color flow Doppler. Additional Comments: A Prominent moderator band is visualized.  LEFT VENTRICLE PLAX 2D LVIDd:         3.30 cm  Diastology LVIDs:         2.20 cm  LV e' medial:  6.20 cm/s LV PW:         1.00 cm  LV e' lateral: 6.20 cm/s LV IVS:        1.00 cm LVOT diam:     2.40 cm LV SV:         55 LV SV Index:   31 LVOT Area:     4.52 cm  RIGHT VENTRICLE TAPSE (M-mode): 2.0 cm LEFT ATRIUM           Index      RIGHT ATRIUM           Index LA diam:      1.60 cm 0.89 cm/m RA Area:     17.90 cm LA Vol (A4C): 14.3 ml 7.97 ml/m RA Volume:   52.60 ml  29.32 ml/m  AORTIC VALVE LVOT Vmax:   60.80 cm/s LVOT Vmean:  42.300 cm/s LVOT VTI:    0.122 m  AORTA Ao Root diam: 3.40 cm  SHUNTS Systemic VTI:  0.12 m Systemic Diam: 2.40 cm Fransico Him MD Electronically signed by Fransico Him MD Signature Date/Time: 12/28/2019/11:42:41 AM    Final  VAS [place] LOWER EXTREMITY VENOUS (DVT)  Result Date: 12/27/2019  Lower Venous DVT Study Indications: Swelling.  Risk Factors: Cancer. Comparison Study: No prior studies. Performing Technologist: Oliver Hum RVT  Examination Guidelines: A complete evaluation includes B-mode imaging, spectral Doppler, color Doppler, and power Doppler  as needed of all accessible portions of each vessel. Bilateral testing is considered an integral part of a complete examination. Limited examinations for reoccurring indications may be performed as noted. The reflux portion of the exam is performed with the patient in reverse Trendelenburg.  +-----+---------------+---------+-----------+----------+--------------+ RIGHTCompressibilityPhasicitySpontaneityPropertiesThrombus Aging +-----+---------------+---------+-----------+----------+--------------+ CFV  Full           Yes      Yes                                 +-----+---------------+---------+-----------+----------+--------------+   +---------+---------------+---------+-----------+----------+--------------+ LEFT     CompressibilityPhasicitySpontaneityPropertiesThrombus Aging +---------+---------------+---------+-----------+----------+--------------+ CFV      Full           Yes      Yes                                 +---------+---------------+---------+-----------+----------+--------------+ SFJ      Full                                                        +---------+---------------+---------+-----------+----------+--------------+ FV Prox  Full                                                        +---------+---------------+---------+-----------+----------+--------------+ FV Mid   Full                                                        +---------+---------------+---------+-----------+----------+--------------+ FV DistalFull                                                        +---------+---------------+---------+-----------+----------+--------------+ PFV      Full                                                        +---------+---------------+---------+-----------+----------+--------------+ POP      None           No       No                   Acute          +---------+---------------+---------+-----------+----------+--------------+  PTV      Partial  Acute          +---------+---------------+---------+-----------+----------+--------------+ PERO     None                                         Acute          +---------+---------------+---------+-----------+----------+--------------+     Summary: RIGHT: - No evidence of common femoral vein obstruction.  LEFT: - Findings consistent with acute deep vein thrombosis involving the left popliteal vein, left posterior tibial veins, and left peroneal veins. - No cystic structure found in the popliteal fossa.  *See table(s) above for measurements and observations. Electronically signed by Deitra Mayo MD on 12/27/2019 at 5:43:22 PM.    Final     Scheduled Meds: . Chlorhexidine Gluconate Cloth  6 each Topical Q0600  . fluticasone furoate-vilanterol  1 puff Inhalation Daily  . gabapentin  300 mg Oral QHS  . montelukast  10 mg Oral QHS  . pantoprazole  40 mg Oral Daily   Continuous Infusions: . heparin 1,050 Units/hr (12/28/19 1827)     LOS: 0 days   Time spent: 40 minutes  Lorella Nimrod, MD [facility]  If 7PM-7AM, please contact night-coverage [email or web address]  12/28/2019, 6:29 PM   This record has been created using Dragon voice recognition software. Errors have been sought and corrected,but may not always be located. Such creation errors do not reflect on the standard of care.

## 2019-12-28 NOTE — Progress Notes (Signed)
[place] for IV heparin Indication: DVT/PE  Allergies  Allergen Reactions  . Cheese Shortness Of Breath and Swelling    Swelling of the throat  . Eggs Or Egg-Derived Products Shortness Of Breath and Swelling    Swelling of the throat  . Penicillins Anaphylaxis    Has patient had a PCN reaction causing immediate rash, facial/tongue/throat swelling, SOB or lightheadedness with hypotension: Yes Has patient had a PCN reaction causing severe rash involving mucus membranes or skin necrosis: Unknown Has patient had a PCN reaction that required hospitalization: Unknown Has patient had a PCN reaction occurring within the last 10 years: Unknown If all of the above answers are "NO", then may proceed with Cephalosporin use.   . Gemfibrozil Nausea Only    Patient Measurements:    Height: '5\' 10"'  Weight: 63.5 kg Heparin Dosing Weight: actual body weight   Vital Signs: Temp: 98.3 F (36.8 C) (12/14 1646) Temp Source: Oral (12/14 1646) BP: 100/85 (12/14 2328) Pulse Rate: 103 (12/14 2328)  Labs: Recent Labs    12/27/19 1130 12/27/19 1645 12/27/19 1936 12/27/19 2329  HGB 11.9* 12.1*  --   --   HCT 37.9* 39.5  --   --   PLT 211 215  --   --   APTT  --  34  --   --   LABPROT  --  13.3  --   --   INR  --  1.1  --   --   HEPARINUNFRC  --   --  0.58 0.71*  CREATININE 1.52* 1.33*  --   --   TROPONINIHS  --  206*  311*  --   --     Estimated Creatinine Clearance: 58.4 mL/min (A) (by C-G formula based on SCr of 1.33 mg/dL (H)).   Medical History: Past Medical History:  Diagnosis Date  . Arthritis    KNEES  . Asthma   . Deaf   . GERD (gastroesophageal reflux disease)    OCC  . 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 Scrotal cyst   . Thyroid disease   . Urinary frequency    OCC    Assessment: 40 y/oM with PMH of colon cancer sent from [facility] with concern for chest pain and acute onset dyspnea. CTa chest + for PE with right  heart strain and LE venous duplex + for LLE DVT. Pharmacy consulted for IV heparin dosing. Patient not on anticoagulants PTA. CBC: Hgb 11.9, Pltc WNL.   12/28/2019 HL 0.71 slightly supra-therapeutic on 1300 units/hr No bleeding noted   Goal of Therapy:  Heparin level 0.3-0.7 units/ml Monitor platelets by anticoagulation protocol: Yes   Plan:   decrease heparin to 1200 units/hr  Heparin level 6 hours  Daily CBC, heparin level  Monitor closely for s/sx of bleeding   Penny Frisbie RPh 12/28/2019, 12:31 AM

## 2019-12-28 NOTE — Progress Notes (Addendum)
[place] for IV heparin Indication: DVT/PE  Allergies  Allergen Reactions  . Cheese Shortness Of Breath and Swelling    Swelling of the throat  . Eggs Or Egg-Derived Products Shortness Of Breath and Swelling    Swelling of the throat  . Penicillins Anaphylaxis    Has patient had a PCN reaction causing immediate rash, facial/tongue/throat swelling, SOB or lightheadedness with hypotension: Yes Has patient had a PCN reaction causing severe rash involving mucus membranes or skin necrosis: Unknown Has patient had a PCN reaction that required hospitalization: Unknown Has patient had a PCN reaction occurring within the last 10 years: Unknown If all of the above answers are "NO", then may proceed with Cephalosporin use.   . Gemfibrozil Nausea Only    Patient Measurements:    Height: 5\' 10"  Weight: 63.5 kg Heparin Dosing Weight: actual body weight   Vital Signs: Temp: 98.3 F (36.8 C) (12/15 0359) Temp Source: Oral (12/15 0359) BP: 98/75 (12/15 0800) Pulse Rate: 92 (12/15 0800)  Labs: Recent Labs    12/27/19 1130 12/27/19 1645 12/27/19 1936 12/27/19 2329 12/28/19 0557 12/28/19 0705  HGB 11.9* 12.1*  --   --  11.5*  --   HCT 37.9* 39.5  --   --  37.8*  --   PLT 211 215  --   --  214  --   APTT  --  34  --   --   --   --   LABPROT  --  13.3  --   --   --   --   INR  --  1.1  --   --   --   --   HEPARINUNFRC  --   --  0.58 0.71*  --  1.80*  CREATININE 1.52* 1.33*  --   --  1.33*  --   TROPONINIHS  --  206*  311*  --  193*  --   --     Estimated Creatinine Clearance: 58.4 mL/min (A) (by C-G formula based on SCr of 1.33 mg/dL (H)).   Assessment: 54 y/oM with PMH of colon cancer sent from [facility] with concern for chest pain and acute onset dyspnea. CTa chest + for PE with right heart strain and LE venous duplex + for LLE DVT. Pharmacy consulted for IV heparin dosing. Patient not on anticoagulants PTA. CBC: Hgb 11.9, Pltc WNL.    12/28/2019 HL@ 0705 am= 1.8  supra-therapeutic after heparin decreased to 1200 units/hr HL drawn by third shift RN, unsure if drawn from same arm as heparin drip Redraw HL @1104  am = 0.82. slightly supra-therapeutic on heparin drip at 1200 units/hr No bleeding noted CBC stable Echo OK, dopplers neg for DVT   Goal of Therapy:  Heparin level 0.3-0.7 units/ml Monitor platelets by anticoagulation protocol: Yes   Plan:   Decrease heparin to 1050 units/hr and check HL in 6 hours  Daily CBC, heparin level  Monitor closely for s/sx of bleeding  Anticipate transition to [place], Pharm.D 12/28/2019 9:42 AM

## 2019-12-28 NOTE — Plan of Care (Signed)
  Problem: Safety: Goal: Ability to remain free from injury will improve Outcome: Progressing   Problem: Education: Goal: Knowledge of General Education information will improve Description: Including pain rating scale, medication(s)/side effects and non-pharmacologic comfort measures Outcome: Progressing   Problem: Health Behavior/Discharge Planning: Goal: Ability to manage health-related needs will improve Outcome: Progressing   Problem: Clinical Measurements: Goal: Ability to maintain clinical measurements within normal limits will improve Outcome: Progressing Goal: Will remain free from infection Outcome: Progressing Goal: Diagnostic test results will improve Outcome: Progressing Goal: Respiratory complications will improve Outcome: Progressing Goal: Cardiovascular complication will be avoided Outcome: Progressing   Problem: Activity: Goal: Risk for activity intolerance will decrease Outcome: Progressing   Problem: Nutrition: Goal: Adequate nutrition will be maintained Outcome: Progressing   Problem: Coping: Goal: Level of anxiety will decrease Outcome: Progressing   Problem: Elimination: Goal: Will not experience complications related to bowel motility Outcome: Progressing Goal: Will not experience complications related to urinary retention Outcome: Progressing   Problem: Pain Managment: Goal: General experience of comfort will improve Outcome: Progressing   Problem: Safety: Goal: Ability to remain free from injury will improve Outcome: Progressing   Problem: Skin Integrity: Goal: Risk for impaired skin integrity will decrease Outcome: Progressing

## 2019-12-29 ENCOUNTER — Inpatient Hospital Stay (HOSPITAL_COMMUNITY): Payer: Medicare Other

## 2019-12-29 DIAGNOSIS — C182 Malignant neoplasm of ascending colon: Secondary | ICD-10-CM

## 2019-12-29 DIAGNOSIS — C787 Secondary malignant neoplasm of liver and intrahepatic bile duct: Secondary | ICD-10-CM

## 2019-12-29 DIAGNOSIS — M7989 Other specified soft tissue disorders: Secondary | ICD-10-CM

## 2019-12-29 DIAGNOSIS — E440 Moderate protein-calorie malnutrition: Secondary | ICD-10-CM

## 2019-12-29 DIAGNOSIS — Z7901 Long term (current) use of anticoagulants: Secondary | ICD-10-CM

## 2019-12-29 DIAGNOSIS — I82402 Acute embolism and thrombosis of unspecified deep veins of left lower extremity: Secondary | ICD-10-CM

## 2019-12-29 DIAGNOSIS — H9190 Unspecified hearing loss, unspecified ear: Secondary | ICD-10-CM

## 2019-12-29 DIAGNOSIS — C779 Secondary and unspecified malignant neoplasm of lymph node, unspecified: Secondary | ICD-10-CM

## 2019-12-29 DIAGNOSIS — I82432 Acute embolism and thrombosis of left popliteal vein: Secondary | ICD-10-CM

## 2019-12-29 DIAGNOSIS — M79609 Pain in unspecified limb: Secondary | ICD-10-CM

## 2019-12-29 DIAGNOSIS — I2609 Other pulmonary embolism with acute cor pulmonale: Principal | ICD-10-CM

## 2019-12-29 DIAGNOSIS — R000 Tachycardia, unspecified: Secondary | ICD-10-CM

## 2019-12-29 DIAGNOSIS — R309 Painful micturition, unspecified: Secondary | ICD-10-CM

## 2019-12-29 DIAGNOSIS — D701 Agranulocytosis secondary to cancer chemotherapy: Secondary | ICD-10-CM

## 2019-12-29 LAB — CBC
HCT: 37.9 % — ABNORMAL LOW (ref 39.0–52.0)
Hemoglobin: 11.6 g/dL — ABNORMAL LOW (ref 13.0–17.0)
MCH: 26.4 pg (ref 26.0–34.0)
MCHC: 30.6 g/dL (ref 30.0–36.0)
MCV: 86.1 fL (ref 80.0–100.0)
Platelets: 231 10*3/uL (ref 150–400)
RBC: 4.4 MIL/uL (ref 4.22–5.81)
RDW: 17.6 % — ABNORMAL HIGH (ref 11.5–15.5)
WBC: 7.4 10*3/uL (ref 4.0–10.5)
nRBC: 0 % (ref 0.0–0.2)

## 2019-12-29 LAB — URINALYSIS, ROUTINE W REFLEX MICROSCOPIC
Bilirubin Urine: NEGATIVE
Glucose, UA: NEGATIVE mg/dL
Hgb urine dipstick: NEGATIVE
Ketones, ur: NEGATIVE mg/dL
Leukocytes,Ua: NEGATIVE
Nitrite: NEGATIVE
Protein, ur: NEGATIVE mg/dL
Specific Gravity, Urine: 1.012 (ref 1.005–1.030)
pH: 7 (ref 5.0–8.0)

## 2019-12-29 MED ORDER — RIVAROXABAN (XARELTO) VTE STARTER PACK (15 & 20 MG): ORAL_TABLET | ORAL | 0 refills | Status: DC

## 2019-12-29 MED ORDER — RIVAROXABAN (XARELTO) EDUCATION KIT FOR DVT/PE PATIENTS
PACK | Freq: Once | Status: AC
  Filled 2019-12-29: qty 1

## 2019-12-29 NOTE — Progress Notes (Signed)
NAME:  Alexander Reeves., MRN:  [id number], DOB:  1965/04/12, LOS: 1 ADMISSION DATE:  12/27/2019, CONSULTATION DATE:  12/14 REFERRING MD:  Vanita Panda, CHIEF COMPLAINT:  Dyspnea   Brief History   54 year old male with metastatic colon cancer admitted from the [facility] emergency department on 12/27/2019 in the setting of a submassive pulmonary embolism.  Past Medical History  Metastatic Moderately differentiated adenocarcinoma of ascending colon (stage IV) s/p R colectomy; has been on chemotherapy (initially FOLFOX and Udenyca 12/2018 through 06/2019): Completed Cycle 4 of FOLFIRI/Avastin 11/2019 Deaf Asthma Neutropenia secondary to chemotherapy  Significant Hospital Events     Consults:    Procedures:    Significant Diagnostic Tests:  12/14 CT angiogram chest images independently reviewed showing a saddle pulmonary embolism, RV strain, bilateral extension of pulmonary emboli, no significant pulmonary parenchymal abnormality 12/14 vasc ultrasound> left popliteal DVT 12/14 CT head > NAICP, no met 40/08 Echo> RV systolic function moderately reduced, dilated, LVEF 60-65%   Micro Data:  12/14 SARS-CoV-2 negative  Antimicrobials:    Interim history/subjective:   Feels better Still has some foot pain in the left foot Breathing better Complains of some right shoulder pain  Objective   Blood pressure 114/77, pulse 93, temperature 98.2 F (36.8 C), temperature source Oral, resp. rate 17, height '5\' 10"'  (1.778 m), weight 63.5 kg, SpO2 94 %.        Intake/Output Summary (Last 24 hours) at 12/29/2019 0759 Last data filed at 12/29/2019 0500 Gross per 24 hour  Intake 1024.09 ml  Output 1425 ml  Net -400.91 ml   Filed Weights   12/28/19 1035  Weight: 63.5 kg    Examination:  General:  Resting comfortably in bed HENT: NCAT OP clear PULM: CTA B, normal effort CV: RRR, no mgr GI: BS+, soft, nontender MSK: normal bulk and tone Derm: no redness or swelling around  port site Neuro: awake, alert, no distress, MAEW    Resolved Hospital Problem list     Assessment & Plan:  Submassive pulmonary embolism with evidence of RV strain based on elevated troponin and BNP: Currently hemodynamically stable.  12/15 looks much better.  HR down, BP improved. Tele monitoring D/C home Ambulate Xarelto indefinitely F/u echo 6 months  R shoulder pain> has port on that side, concern for catheter associated blood clot RUE ultrasound prior to discharge  Wheezing Prn albuterol  Best practice (evaluated daily)   Per [facility]  Labs   CBC: Recent Labs  Lab 12/27/19 1130 12/27/19 1645 12/28/19 0557 12/29/19 0500  WBC 7.8 9.2 8.5 7.4  NEUTROABS 5.3 6.8 5.1  --   HGB 11.9* 12.1* 11.5* 11.6*  HCT 37.9* 39.5 37.8* 37.9*  MCV 84.8 86.6 85.9 86.1  PLT 211 215 214 676    Basic Metabolic Panel: Recent Labs  Lab 12/27/19 1130 12/27/19 1645 12/28/19 0557  NA 140 136 139  K 4.0 4.1 3.9  CL 108 105 105  CO2 '27 24 24  ' GLUCOSE 104* 104* 90  BUN '12 12 9  ' CREATININE 1.52* 1.33* 1.33*  CALCIUM 9.5 8.9 8.6*  MG  --   --  2.4  PHOS  --   --  3.5   GFR: Estimated Creatinine Clearance: 58.4 mL/min (A) (by C-G formula based on SCr of 1.33 mg/dL (H)). Recent Labs  Lab 12/27/19 1130 12/27/19 1645 12/28/19 0557 12/29/19 0500  WBC 7.8 9.2 8.5 7.4    Liver Function Tests: Recent Labs  Lab 12/27/19 1130 12/27/19 1645 12/28/19 0557  AST '17 19 15  ' ALT '16 16 14  ' ALKPHOS 84 80 68  BILITOT 0.5 0.4 0.4  PROT 7.4 7.3 6.5  ALBUMIN 3.7 3.9 3.5   No results for input(s): LIPASE, AMYLASE in the last 168 hours. No results for input(s): AMMONIA in the last 168 hours.  ABG    Component Value Date/Time   PHART 7.318 (L) 10/17/2018 0436   PCO2ART 45.1 10/17/2018 0436   PO2ART 95.0 10/17/2018 0436   HCO3 23.4 10/17/2018 0436   TCO2 25 10/17/2018 0436   ACIDBASEDEF 3.0 (H) 10/17/2018 0436   O2SAT 97.0 10/17/2018 0436     Coagulation Profile: Recent Labs   Lab 12/27/19 1645  INR 1.1    Cardiac Enzymes: No results for input(s): CKTOTAL, CKMB, CKMBINDEX, TROPONINI in the last 168 hours.  HbA1C: No results found for: HGBA1C  CBG: No results for input(s): GLUCAP in the last 168 hours.   Critical care time: n/a       Roselie Awkward, MD [facility] Pager: [phone] Cell: [phone] If no response, call [phone]

## 2019-12-29 NOTE — Progress Notes (Signed)
Upper extremity venous has been completed.   Preliminary results in CV Proc.   Abram Sander 12/29/2019 1:24 PM

## 2019-12-29 NOTE — Discharge Summary (Signed)
Physician Discharge Summary  Alexander DZIKOWSKI Sr. QVZ:[id number] DOB: 03/19/1965 DOA: 12/27/2019  PCP: [name], NP  Admit date: 12/27/2019 Discharge date: 12/29/2019  Admitted From: Home Disposition: Home  Recommendations for Outpatient Follow-up:  1. Follow up with PCP in 1-2 weeks 2. Please obtain BMP/CBC in one week 3. Please follow up on the following pending results: None  Home Health: No Equipment/Devices: None Discharge Condition: Stable CODE STATUS: Full Diet recommendation: Heart Healthy   Brief/Interim Summary: Alexander Reevesis a 54 y.o.malewith medical history significant of Colon CA, deaf, asthma,s/p R colectomy, presented with worsening shortness of breath and tachycardia.  Patient presented to his oncologist yesterday with left lower extremity edema and shortness of breath.  She was sent to ED for concern of DVT and PE.  Venous Doppler studies were positive for left popliteal, posterior tibial and peroneal vein DVT and CTA with submassive pulmonary embolism with evidence of right heart strain.  Echocardiogram with moderately reduced right ventricular systolic function.  Patient maintained his saturation on room air. Pulmonary and oncology was consulted.  He is not a candidate for TPA or mechanical thrombectomy.  Remained stable.  Initially treated with heparin infusion, later transitioned to Xarelto.  Patient will continue with Xarelto for at least 3 to 40-month and his oncologist can decide about the duration of Xarelto.  Patient was also complaining of some pain around his port, right upper extremity venous Doppler was negative for any thrombosis.  Patient did had elevated troponin most likely secondary to demand ischemia with PE.  Patient will follow up with his primary care provider and oncology and continue with his chemotherapy after the discharge.  Discharge Diagnoses:  Active Problems:   Chronic asthma   Colon cancer (HCC)   Elevated  troponin   DVT (deep venous thrombosis) (HCC)   Pulmonary embolism (HCC)   Malnutrition of moderate degree   Discharge Instructions  Discharge Instructions    Diet - low sodium heart healthy   Complete by: As directed    Discharge instructions   Complete by: As directed    It was pleasure taking care of you.   You are being given blood thinner called Xarelto, you will take it 15 mg twice daily for 3 weeks and then start taking 20 mg daily, please follow-up with your oncologist for the duration of your blood thinner.  You will need for at least 3 to 6-month. Continue taking rest of your medications and follow-up with your doctors.   Increase activity slowly   Complete by: As directed      Allergies as of 12/29/2019      Reactions   Cheese Shortness Of Breath, Swelling   Swelling of the throat   Eggs Or Egg-derived Products Shortness Of Breath, Swelling   Swelling of the throat   Penicillins Anaphylaxis   Has patient had a PCN reaction causing immediate rash, facial/tongue/throat swelling, SOB or lightheadedness with hypotension: Yes Has patient had a PCN reaction causing severe rash involving mucus membranes or skin necrosis: Unknown Has patient had a PCN reaction that required hospitalization: Unknown Has patient had a PCN reaction occurring within the last 10 years: Unknown If all of the above answers are "NO", then may proceed with Cephalosporin use.   Gemfibrozil Nausea Only      Medication List    TAKE these medications   albuterol (2.5 MG/3ML) 0.083% nebulizer solution Commonly known as: PROVENTIL Take 3 mLs (2.5 mg total) by nebulization every 6 (six)  hours as needed for wheezing or shortness of breath.   albuterol 108 (90 Base) MCG/ACT inhaler Commonly known as: VENTOLIN HFA Inhale 2 puffs into the lungs every 4 (four) hours as needed for wheezing or shortness of breath.   Breo Ellipta 200-25 MCG/INH Aepb Generic drug: fluticasone furoate-vilanterol Inhale 1  puff into the lungs daily. What changed:   when to take this  reasons to take this   ciclopirox 8 % solution Commonly known as: Penlac Apply topically at bedtime. Apply over nail and surrounding skin. Apply daily over previous coat. After seven (7) days, may remove with alcohol and continue cycle.   CLEAR EYES OP Place 1 drop into both eyes daily as needed (redness/dryness).   fenofibrate 145 MG tablet Commonly known as: TRICOR TAKE 1 TABLET (145 MG TOTAL) BY MOUTH DAILY.   gabapentin 300 MG capsule Commonly known as: NEURONTIN Take 1 capsule (300 mg total) by mouth at bedtime.   ibuprofen 600 MG tablet Commonly known as: ADVIL Take 1 tablet (600 mg total) by mouth every 8 (eight) hours as needed for moderate pain.   lidocaine-prilocaine cream Commonly known as: EMLA Apply 1 application topically as needed. To feet What changed:   when to take this  reasons to take this  additional instructions   loratadine 10 MG tablet Commonly known as: CLARITIN Take 10 mg by mouth daily as needed for allergies.   methocarbamol 500 MG tablet Commonly known as: Robaxin Take 1 tablet (500 mg total) by mouth every 8 (eight) hours as needed for muscle spasms.   montelukast 10 MG tablet Commonly known as: SINGULAIR Take 1 tablet (10 mg total) by mouth at bedtime.   pantoprazole 40 MG tablet Commonly known as: PROTONIX Take 1 tablet (40 mg total) by mouth daily.   Rivaroxaban Stater Pack (15 mg and 20 mg) Commonly known as: XARELTO STARTER PACK Follow package directions: Take one 15mg  tablet by mouth twice a day. On day 22, switch to one 20mg  tablet once a day. Take with food.       Follow-up Information    [name], NP. Schedule an appointment as soon as possible for a visit.   Specialty: Internal Medicine Contact information: [address] [place] [address] [phone]              Allergies  Allergen Reactions  . Cheese Shortness Of Breath  and Swelling    Swelling of the throat  . Eggs Or Egg-Derived Products Shortness Of Breath and Swelling    Swelling of the throat  . Penicillins Anaphylaxis    Has patient had a PCN reaction causing immediate rash, facial/tongue/throat swelling, SOB or lightheadedness with hypotension: Yes Has patient had a PCN reaction causing severe rash involving mucus membranes or skin necrosis: Unknown Has patient had a PCN reaction that required hospitalization: Unknown Has patient had a PCN reaction occurring within the last 10 years: Unknown If all of the above answers are "NO", then may proceed with Cephalosporin use.   . Gemfibrozil Nausea Only    Consultations:  Oncology  Pulmonology  Procedures/Studies: CT HEAD WO CONTRAST  Result Date: 12/27/2019 CLINICAL DATA:  Metastatic colon cancer.  Staging examination EXAM: CT HEAD WITHOUT CONTRAST TECHNIQUE: Contiguous axial images were obtained from the base of the skull through the vertex without intravenous contrast. COMPARISON:  None. FINDINGS: Brain: Normal anatomic configuration. No abnormal intra or extra-axial mass lesion or fluid collection. No abnormal mass effect or midline shift. No evidence of  acute intracranial hemorrhage or infarct. Ventricular size is normal. Cerebellum unremarkable. Vascular: Unremarkable Skull: Intact Sinuses/Orbits: Paranasal sinuses are clear. Orbits are unremarkable. Other: Mastoid air cells and middle ear cavities are clear. IMPRESSION: Normal examination.  No evidence of intracranial metastatic disease. Electronically Signed   By: Fidela Salisbury MD   On: 12/27/2019 19:06   CT ANGIO CHEST PE W OR WO CONTRAST  Result Date: 12/27/2019 CLINICAL DATA:  History of colon cancer with acute onset of dyspnea and tachycardia. EXAM: CT ANGIOGRAPHY CHEST WITH CONTRAST TECHNIQUE: Multidetector CT imaging of the chest was performed using the standard protocol during bolus administration of intravenous contrast. Multiplanar CT  image reconstructions and MIPs were obtained to evaluate the vascular anatomy. CONTRAST:  164mL OMNIPAQUE IOHEXOL 350 MG/ML SOLN COMPARISON:  Chest CT 09/29/2019 FINDINGS: Cardiovascular: The heart is within normal limits in size and stable. No pericardial effusion. The aorta is normal in caliber. No dissection or atherosclerotic calcifications. The branch vessels are patent. No definite coronary artery calcifications. The pulmonary arterial tree is fairly well opacified. There are significant central and peripheral filling defects consistent with sub massive pulmonary embolism. Evidence of right heart strain with flattening of the left ventricle. The RV LV ratio is 1.18. Mediastinum/Nodes: No mediastinal or hilar mass or adenopathy. The esophagus is grossly normal. Lungs/Pleura: No worrisome pulmonary nodules to suggest pulmonary metastatic disease. No infiltrates or effusions. No pulmonary edema. Upper Abdomen: Hepatic metastatic disease is again demonstrated. No significant change. Musculoskeletal: No significant bony findings.  Moderate scoliosis. Review of the MIP images confirms the above findings. IMPRESSION: 1. Positive for sub massive pulmonary embolism with CT evidence of right heart strain (RV/LV Ratio 1.18) consistent with at least submassive (intermediate risk) PE. The presence of right heart strain has been associated with an increased risk of morbidity and mortality. 2. Normal thoracic aorta. 3. No worrisome pulmonary nodules or infiltrates. 4. Hepatic metastatic disease. These results were called by telephone at the time of interpretation on 12/27/2019 at 2:50 pm to provider Dr. Carin Hock nurse, Manuela Schwartz, Who verbally acknowledged these results. Electronically Signed   By: Marijo Sanes M.D.   On: 12/27/2019 14:50   ECHOCARDIOGRAM COMPLETE  Result Date: 12/28/2019    ECHOCARDIOGRAM REPORT   Patient Name:   Alexander MCCARTER Sr. Date of Exam: 12/28/2019 Medical Rec #:  [id number]            Height:        70.0 in Accession #:    [id number]           Weight:       140.1 lb Date of Birth:  [birth year]            BSA:          1.794 m Patient Age:    40 years             BP:           107/74 mmHg Patient Gender: M                    HR:           95 bpm. Exam Location:  Inpatient Procedure: 2D Echo Indications:    Pulmonary Embolism; Dyspnea R06.00  History:        Patient has no prior history of Echocardiogram examinations.  Sonographer:    Mikki Santee RDCS (AE) Referring Phys: [place]  1. Left ventricular ejection fraction, by estimation, is 60 to  65%. The left ventricle has normal function. The left ventricle has no regional wall motion abnormalities. Left ventricular diastolic function could not be evaluated.  2. Right ventricular systolic function is moderately reduced. The right ventricular size is normal. A Prominent moderator band is visualized. Tricuspid regurgitation signal is inadequate for assessing PA pressure.  3. Right atrial size was mildly dilated.  4. The pericardial effusion is posterior to the left ventricle.  5. The mitral valve is degenerative. No evidence of mitral valve regurgitation. No evidence of mitral stenosis.  6. The aortic valve was not well visualized. Aortic valve regurgitation is not visualized. Mild aortic valve sclerosis is present, with no evidence of aortic valve stenosis.  7. The inferior vena cava is normal in size with greater than 50% respiratory variability, suggesting right atrial pressure of 3 mmHg. FINDINGS  Left Ventricle: Left ventricular ejection fraction, by estimation, is 60 to 65%. The left ventricle has normal function. The left ventricle has no regional wall motion abnormalities. The left ventricular internal cavity size was normal in size. There is  no left ventricular hypertrophy. Left ventricular diastolic function could not be evaluated. Right Ventricle: The right ventricular size is normal. No increase in right ventricular  wall thickness. Right ventricular systolic function is moderately reduced. Tricuspid regurgitation signal is inadequate for assessing PA pressure. Left Atrium: Left atrial size was normal in size. Right Atrium: Right atrial size was mildly dilated. Pericardium: Trivial pericardial effusion is present. The pericardial effusion is posterior to the left ventricle. Mitral Valve: The mitral valve is degenerative in appearance. There is mild calcification of the mitral valve leaflet(s). Mild mitral annular calcification. No evidence of mitral valve regurgitation. No evidence of mitral valve stenosis. Tricuspid Valve: The tricuspid valve is normal in structure. Tricuspid valve regurgitation is mild . No evidence of tricuspid stenosis. Aortic Valve: The aortic valve was not well visualized. Aortic valve regurgitation is not visualized. Mild aortic valve sclerosis is present, with no evidence of aortic valve stenosis. Pulmonic Valve: The pulmonic valve was normal in structure. Pulmonic valve regurgitation is not visualized. No evidence of pulmonic stenosis. Aorta: The aortic root is normal in size and structure. Venous: The inferior vena cava is normal in size with greater than 50% respiratory variability, suggesting right atrial pressure of 3 mmHg. IAS/Shunts: No atrial level shunt detected by color flow Doppler. Additional Comments: A Prominent moderator band is visualized.  LEFT VENTRICLE PLAX 2D LVIDd:         3.30 cm  Diastology LVIDs:         2.20 cm  LV e' medial:  6.20 cm/s LV PW:         1.00 cm  LV e' lateral: 6.20 cm/s LV IVS:        1.00 cm LVOT diam:     2.40 cm LV SV:         55 LV SV Index:   31 LVOT Area:     4.52 cm  RIGHT VENTRICLE TAPSE (M-mode): 2.0 cm LEFT ATRIUM           Index      RIGHT ATRIUM           Index LA diam:      1.60 cm 0.89 cm/m RA Area:     17.90 cm LA Vol (A4C): 14.3 ml 7.97 ml/m RA Volume:   52.60 ml  29.32 ml/m  AORTIC VALVE LVOT Vmax:   60.80 cm/s LVOT Vmean:  42.300 cm/s LVOT  VTI:  0.122 m  AORTA Ao Root diam: 3.40 cm  SHUNTS Systemic VTI:  0.12 m Systemic Diam: 2.40 cm Fransico Him MD Electronically signed by Fransico Him MD Signature Date/Time: 12/28/2019/11:42:41 AM    Final    VAS [place] LOWER EXTREMITY VENOUS (DVT)  Result Date: 12/27/2019  Lower Venous DVT Study Indications: Swelling.  Risk Factors: Cancer. Comparison Study: No prior studies. Performing Technologist: Oliver Hum RVT  Examination Guidelines: A complete evaluation includes B-mode imaging, spectral Doppler, color Doppler, and power Doppler as needed of all accessible portions of each vessel. Bilateral testing is considered an integral part of a complete examination. Limited examinations for reoccurring indications may be performed as noted. The reflux portion of the exam is performed with the patient in reverse Trendelenburg.  +-----+---------------+---------+-----------+----------+--------------+ RIGHTCompressibilityPhasicitySpontaneityPropertiesThrombus Aging +-----+---------------+---------+-----------+----------+--------------+ CFV  Full           Yes      Yes                                 +-----+---------------+---------+-----------+----------+--------------+   +---------+---------------+---------+-----------+----------+--------------+ LEFT     CompressibilityPhasicitySpontaneityPropertiesThrombus Aging +---------+---------------+---------+-----------+----------+--------------+ CFV      Full           Yes      Yes                                 +---------+---------------+---------+-----------+----------+--------------+ SFJ      Full                                                        +---------+---------------+---------+-----------+----------+--------------+ FV Prox  Full                                                        +---------+---------------+---------+-----------+----------+--------------+ FV Mid   Full                                                         +---------+---------------+---------+-----------+----------+--------------+ FV DistalFull                                                        +---------+---------------+---------+-----------+----------+--------------+ PFV      Full                                                        +---------+---------------+---------+-----------+----------+--------------+ POP      None           No       No  Acute          +---------+---------------+---------+-----------+----------+--------------+ PTV      Partial                                      Acute          +---------+---------------+---------+-----------+----------+--------------+ PERO     None                                         Acute          +---------+---------------+---------+-----------+----------+--------------+     Summary: RIGHT: - No evidence of common femoral vein obstruction.  LEFT: - Findings consistent with acute deep vein thrombosis involving the left popliteal vein, left posterior tibial veins, and left peroneal veins. - No cystic structure found in the popliteal fossa.  *See table(s) above for measurements and observations. Electronically signed by Deitra Mayo MD on 12/27/2019 at 5:43:22 PM.    Final      Subjective: Patient is deaf and communication was done partly in writing and partly with the help of interpreter.  Having some mild headache and neck pain.  Discharge Exam: Vitals:   12/29/19 0800 12/29/19 0835  BP:    Pulse:    Resp:    Temp: 97.9 F (36.6 C)   SpO2:  96%   Vitals:   12/29/19 0500 12/29/19 0600 12/29/19 0800 12/29/19 0835  BP: 114/77     Pulse:  93    Resp:  17    Temp:   97.9 F (36.6 C)   TempSrc:   Oral   SpO2: 99% 94%  96%  Weight:      Height:        General: Pt is alert, awake, not in acute distress Cardiovascular: RRR, S1/S2 +, no rubs, no gallops Respiratory: CTA bilaterally, no wheezing, no rhonchi Abdominal:  Soft, NT, ND, bowel sounds + Extremities: no edema, no cyanosis   The results of significant diagnostics from this hospitalization (including imaging, microbiology, ancillary and laboratory) are listed below for reference.    Microbiology: Recent Results (from the past 240 hour(s))  SARS Coronavirus 2 (TAT 6-24 hrs)     Status: None   Collection Time: 12/27/19  3:31 PM   Specimen: Nasopharyngeal Swab  Result Value Ref Range Status   SARS Coronavirus 2 NEGATIVE NEGATIVE Final    Comment: (NOTE) SARS-CoV-2 target nucleic acids are NOT DETECTED.  The SARS-CoV-2 RNA is generally detectable in upper and lower respiratory specimens during the acute phase of infection. Negative results do not preclude SARS-CoV-2 infection, do not rule out co-infections with other pathogens, and should not be used as the sole basis for treatment or other patient management decisions. Negative results must be combined with clinical observations, patient history, and epidemiological information. The expected result is Negative.  Fact Sheet for Patients: SugarRoll.be  Fact Sheet for Healthcare Providers: https://www.woods-mathews.com/  This test is not yet approved or cleared by the [place] FDA and  has been authorized for detection and/or diagnosis of SARS-CoV-2 by FDA under an Emergency Use Authorization (EUA). This EUA will remain  in effect (meaning this test can be used) for the duration of the COVID-19 declaration under Se ction 564(b)(1) of the Act, 21 U.S.C. section 360bbb-3(b)(1), unless the authorization is terminated or revoked sooner.  Performed at  [facility], [place] [address] [place], [place] [address]   Resp Panel by RT-PCR (Flu A&B, Covid) Nasopharyngeal Swab     Status: None   Collection Time: 12/27/19  9:48 PM   Specimen: Nasopharyngeal Swab; Nasopharyngeal(NP) swabs in vial transport medium  Result Value Ref Range Status   SARS  Coronavirus 2 by RT PCR NEGATIVE NEGATIVE Final    Comment: (NOTE) SARS-CoV-2 target nucleic acids are NOT DETECTED.  The SARS-CoV-2 RNA is generally detectable in upper respiratory specimens during the acute phase of infection. The lowest concentration of SARS-CoV-2 viral copies this assay can detect is 138 copies/mL. A negative result does not preclude SARS-Cov-2 infection and should not be used as the sole basis for treatment or other patient management decisions. A negative result may occur with  improper specimen collection/handling, submission of specimen other than nasopharyngeal swab, presence of viral mutation(s) within the areas targeted by this assay, and inadequate number of viral copies(<138 copies/mL). A negative result must be combined with clinical observations, patient history, and epidemiological information. The expected result is Negative.  Fact Sheet for Patients:  EntrepreneurPulse.com.au  Fact Sheet for Healthcare Providers:  IncredibleEmployment.be  This test is no t yet approved or cleared by the [place] FDA and  has been authorized for detection and/or diagnosis of SARS-CoV-2 by FDA under an Emergency Use Authorization (EUA). This EUA will remain  in effect (meaning this test can be used) for the duration of the COVID-19 declaration under Section 564(b)(1) of the Act, 21 U.S.C.section 360bbb-3(b)(1), unless the authorization is terminated  or revoked sooner.       Influenza A by PCR NEGATIVE NEGATIVE Final   Influenza B by PCR NEGATIVE NEGATIVE Final    Comment: (NOTE) The Xpert Xpress SARS-CoV-2/FLU/RSV plus assay is intended as an aid in the diagnosis of influenza from Nasopharyngeal swab specimens and should not be used as a sole basis for treatment. Nasal washings and aspirates are unacceptable for Xpert Xpress SARS-CoV-2/FLU/RSV testing.  Fact Sheet for  Patients: EntrepreneurPulse.com.au  Fact Sheet for Healthcare Providers: IncredibleEmployment.be  This test is not yet approved or cleared by the [place] FDA and has been authorized for detection and/or diagnosis of SARS-CoV-2 by FDA under an Emergency Use Authorization (EUA). This EUA will remain in effect (meaning this test can be used) for the duration of the COVID-19 declaration under Section 564(b)(1) of the Act, 21 U.S.C. section 360bbb-3(b)(1), unless the authorization is terminated or revoked.  Performed at [facility], [place] [address], [place], [place] [address]   MRSA PCR Screening     Status: None   Collection Time: 12/28/19  2:00 AM   Specimen: Nasopharyngeal  Result Value Ref Range Status   MRSA by PCR NEGATIVE NEGATIVE Final    Comment:        The GeneXpert MRSA Assay (FDA approved for NASAL specimens only), is one component of a comprehensive MRSA colonization surveillance program. It is not intended to diagnose MRSA infection nor to guide or monitor treatment for MRSA infections. Performed at [facility], [place] [address], [place], [place] [address]      Labs: BNP (last 3 results) Recent Labs    12/27/19 1645  BNP 491.7*   Basic Metabolic Panel: Recent Labs  Lab 12/27/19 1130 12/27/19 1645 12/28/19 0557  NA 140 136 139  K 4.0 4.1 3.9  CL 108 105 105  CO2 27 24 24   GLUCOSE 104* 104* 90  BUN 12 12  9  CREATININE 1.52* 1.33* 1.33*  CALCIUM 9.5 8.9 8.6*  MG  --   --  2.4  PHOS  --   --  3.5   Liver Function Tests: Recent Labs  Lab 12/27/19 1130 12/27/19 1645 12/28/19 0557  AST 17 19 15   ALT 16 16 14   ALKPHOS 84 80 68  BILITOT 0.5 0.4 0.4  PROT 7.4 7.3 6.5  ALBUMIN 3.7 3.9 3.5   No results for input(s): LIPASE, AMYLASE in the last 168 hours. No results for input(s): AMMONIA in the last 168 hours. CBC: Recent Labs  Lab 12/27/19 1130 12/27/19 1645  12/28/19 0557 12/29/19 0500  WBC 7.8 9.2 8.5 7.4  NEUTROABS 5.3 6.8 5.1  --   HGB 11.9* 12.1* 11.5* 11.6*  HCT 37.9* 39.5 37.8* 37.9*  MCV 84.8 86.6 85.9 86.1  PLT 211 215 214 231   Cardiac Enzymes: No results for input(s): CKTOTAL, CKMB, CKMBINDEX, TROPONINI in the last 168 hours. BNP: Invalid input(s): POCBNP CBG: No results for input(s): GLUCAP in the last 168 hours. D-Dimer No results for input(s): DDIMER in the last 72 hours. Hgb A1c No results for input(s): HGBA1C in the last 72 hours. Lipid Profile No results for input(s): CHOL, HDL, LDLCALC, TRIG, CHOLHDL, LDLDIRECT in the last 72 hours. Thyroid function studies Recent Labs    12/28/19 0557  TSH 1.838   Anemia work up No results for input(s): VITAMINB12, FOLATE, FERRITIN, TIBC, IRON, RETICCTPCT in the last 72 hours. Urinalysis    Component Value Date/Time   COLORURINE YELLOW 10/03/2019 1658   APPEARANCEUR CLEAR 10/03/2019 1658   LABSPEC 1.019 10/03/2019 1658   PHURINE 6.0 10/03/2019 1658   GLUCOSEU NEGATIVE 10/03/2019 1658   HGBUR NEGATIVE 10/03/2019 1658   BILIRUBINUR NEGATIVE 10/03/2019 1658   BILIRUBINUR negative 09/26/2019 1606   KETONESUR NEGATIVE 10/03/2019 1658   PROTEINUR TRACE (A) 12/27/2019 1110   UROBILINOGEN 0.2 09/26/2019 1606   NITRITE NEGATIVE 10/03/2019 1658   LEUKOCYTESUR NEGATIVE 10/03/2019 1658   Sepsis Labs Invalid input(s): PROCALCITONIN,  WBC,  LACTICIDVEN Microbiology Recent Results (from the past 240 hour(s))  SARS Coronavirus 2 (TAT 6-24 hrs)     Status: None   Collection Time: 12/27/19  3:31 PM   Specimen: Nasopharyngeal Swab  Result Value Ref Range Status   SARS Coronavirus 2 NEGATIVE NEGATIVE Final    Comment: (NOTE) SARS-CoV-2 target nucleic acids are NOT DETECTED.  The SARS-CoV-2 RNA is generally detectable in upper and lower respiratory specimens during the acute phase of infection. Negative results do not preclude SARS-CoV-2 infection, do not rule out co-infections  with other pathogens, and should not be used as the sole basis for treatment or other patient management decisions. Negative results must be combined with clinical observations, patient history, and epidemiological information. The expected result is Negative.  Fact Sheet for Patients: SugarRoll.be  Fact Sheet for Healthcare Providers: https://www.woods-mathews.com/  This test is not yet approved or cleared by the [place] FDA and  has been authorized for detection and/or diagnosis of SARS-CoV-2 by FDA under an Emergency Use Authorization (EUA). This EUA will remain  in effect (meaning this test can be used) for the duration of the COVID-19 declaration under Se ction 564(b)(1) of the Act, 21 U.S.C. section 360bbb-3(b)(1), unless the authorization is terminated or revoked sooner.  Performed at [facility], [place] [address], [place], [place] [address]   Resp Panel by RT-PCR (Flu A&B, Covid) Nasopharyngeal Swab     Status: None   Collection Time:  12/27/19  9:48 PM   Specimen: Nasopharyngeal Swab; Nasopharyngeal(NP) swabs in vial transport medium  Result Value Ref Range Status   SARS Coronavirus 2 by RT PCR NEGATIVE NEGATIVE Final    Comment: (NOTE) SARS-CoV-2 target nucleic acids are NOT DETECTED.  The SARS-CoV-2 RNA is generally detectable in upper respiratory specimens during the acute phase of infection. The lowest concentration of SARS-CoV-2 viral copies this assay can detect is 138 copies/mL. A negative result does not preclude SARS-Cov-2 infection and should not be used as the sole basis for treatment or other patient management decisions. A negative result may occur with  improper specimen collection/handling, submission of specimen other than nasopharyngeal swab, presence of viral mutation(s) within the areas targeted by this assay, and inadequate number of viral copies(<138 copies/mL). A negative result must be combined  with clinical observations, patient history, and epidemiological information. The expected result is Negative.  Fact Sheet for Patients:  EntrepreneurPulse.com.au  Fact Sheet for Healthcare Providers:  IncredibleEmployment.be  This test is no t yet approved or cleared by the [place] FDA and  has been authorized for detection and/or diagnosis of SARS-CoV-2 by FDA under an Emergency Use Authorization (EUA). This EUA will remain  in effect (meaning this test can be used) for the duration of the COVID-19 declaration under Section 564(b)(1) of the Act, 21 U.S.C.section 360bbb-3(b)(1), unless the authorization is terminated  or revoked sooner.       Influenza A by PCR NEGATIVE NEGATIVE Final   Influenza B by PCR NEGATIVE NEGATIVE Final    Comment: (NOTE) The Xpert Xpress SARS-CoV-2/FLU/RSV plus assay is intended as an aid in the diagnosis of influenza from Nasopharyngeal swab specimens and should not be used as a sole basis for treatment. Nasal washings and aspirates are unacceptable for Xpert Xpress SARS-CoV-2/FLU/RSV testing.  Fact Sheet for Patients: EntrepreneurPulse.com.au  Fact Sheet for Healthcare Providers: IncredibleEmployment.be  This test is not yet approved or cleared by the [place] FDA and has been authorized for detection and/or diagnosis of SARS-CoV-2 by FDA under an Emergency Use Authorization (EUA). This EUA will remain in effect (meaning this test can be used) for the duration of the COVID-19 declaration under Section 564(b)(1) of the Act, 21 U.S.C. section 360bbb-3(b)(1), unless the authorization is terminated or revoked.  Performed at [facility], [place] [address] [place], [place] [address]   MRSA PCR Screening     Status: None   Collection Time: 12/28/19  2:00 AM   Specimen: Nasopharyngeal  Result Value Ref Range Status   MRSA by PCR NEGATIVE NEGATIVE  Final    Comment:        The GeneXpert MRSA Assay (FDA approved for NASAL specimens only), is one component of a comprehensive MRSA colonization surveillance program. It is not intended to diagnose MRSA infection nor to guide or monitor treatment for MRSA infections. Performed at [facility], [place] [address] [place], [place] [address]     Time coordinating discharge: Over 30 minutes  SIGNED:  Lorella Nimrod, MD  [facility] 12/29/2019, 10:25 AM  If 7PM-7AM, please contact night-coverage [email or web address]  This record has been created using Systems analyst. Errors have been sought and corrected,but may not always be located. Such creation errors do not reflect on the standard of care.

## 2019-12-29 NOTE — Progress Notes (Addendum)
HEMATOLOGY-ONCOLOGY PROGRESS NOTE  SUBJECTIVE: Alexander Reeves reports that he is feeling better.  He has been started on Xarelto.  Reports some discomfort around his Port-A-Cath site and also his left neck.  Breathing is stable.  He reports some discomfort of urination.  Remains afebrile.  Oncology History  Malignant neoplasm of ascending colon ([place])  11/19/2018 Initial Diagnosis   Malignant neoplasm of ascending colon ([place])   11/01/2019 -  Chemotherapy   The patient had palonosetron (ALOXI) injection 0.25 mg, 0.25 mg, Intravenous,  Once, 4 of 6 cycles Administration: 0.25 mg (11/01/2019), 0.25 mg (11/15/2019), 0.25 mg (11/29/2019), 0.25 mg (12/13/2019) pegfilgrastim-cbqv (UDENYCA) injection 6 mg, 6 mg, Subcutaneous, Once, 4 of 6 cycles Administration: 6 mg (11/03/2019), 6 mg (11/17/2019), 6 mg (12/01/2019), 6 mg (12/15/2019) irinotecan (CAMPTOSAR) 320 mg in sodium chloride 0.9 % 500 mL chemo infusion, 180 mg/m2 = 320 mg, Intravenous,  Once, 4 of 6 cycles Administration: 320 mg (11/01/2019), 320 mg (11/15/2019), 320 mg (11/29/2019), 320 mg (12/13/2019) fluorouracil (ADRUCIL) chemo injection 700 mg, 400 mg/m2 = 700 mg, Intravenous,  Once, 4 of 6 cycles Administration: 700 mg (11/01/2019), 700 mg (11/15/2019), 700 mg (11/29/2019), 700 mg (12/13/2019) fluorouracil (ADRUCIL) 4,300 mg in sodium chloride 0.9 % 64 mL chemo infusion, 2,400 mg/m2 = 4,300 mg, Intravenous, 1 Day/Dose, 4 of 6 cycles Administration: 4,300 mg (11/01/2019), 4,300 mg (11/15/2019), 4,300 mg (11/29/2019), 4,300 mg (12/13/2019) bevacizumab-bvzr (ZIRABEV) 300 mg in sodium chloride 0.9 % 100 mL chemo infusion, 5 mg/kg = 300 mg, Intravenous,  Once, 4 of 6 cycles Administration: 300 mg (11/01/2019), 300 mg (11/15/2019), 300 mg (11/29/2019), 300 mg (12/13/2019) leucovorin 716 mg in sodium chloride 0.9 % 250 mL infusion, 400 mg/m2 = 716 mg, Intravenous,  Once, 4 of 6 cycles Administration: 716 mg (11/01/2019), 716 mg (11/15/2019), 716 mg  (11/29/2019), 716 mg (12/13/2019)  for chemotherapy treatment.    Colon cancer ([place])  11/19/2018 Initial Diagnosis   Colon cancer ([place])   12/14/2018 Cancer Staging   Staging form: Colon and Rectum, AJCC 8th Edition - Pathologic: Stage IIIB (pT3, pN1, cM0) - Signed by Ladell Pier, MD on 12/14/2018   12/28/2018 - 06/17/2019 Chemotherapy   The patient had palonosetron (ALOXI) injection 0.25 mg, 0.25 mg, Intravenous,  Once, 9 of 9 cycles Administration: 0.25 mg (12/28/2018), 0.25 mg (01/11/2019), 0.25 mg (01/25/2019), 0.25 mg (02/21/2019), 0.25 mg (03/09/2019), 0.25 mg (03/21/2019), 0.25 mg (04/04/2019), 0.25 mg (04/18/2019), 0.25 mg (05/02/2019) pegfilgrastim-cbqv (UDENYCA) injection 6 mg, 6 mg, Subcutaneous, Once, 7 of 7 cycles Administration: 6 mg (01/13/2019), 6 mg (02/23/2019), 6 mg (03/11/2019), 6 mg (03/23/2019), 6 mg (04/06/2019), 6 mg (04/20/2019), 6 mg (05/04/2019) leucovorin 768 mg in dextrose 5 % 250 mL infusion, 400 mg/m2 = 768 mg, Intravenous,  Once, 10 of 10 cycles Administration: 768 mg (12/28/2018), 756 mg (01/11/2019), 756 mg (01/25/2019), 756 mg (02/21/2019), 756 mg (03/09/2019), 756 mg (03/21/2019), 756 mg (04/04/2019), 756 mg (04/18/2019), 756 mg (05/02/2019), 756 mg (06/15/2019) oxaliplatin (ELOXATIN) 165 mg in dextrose 5 % 500 mL chemo infusion, 85 mg/m2 = 165 mg, Intravenous,  Once, 9 of 9 cycles Dose modification: 65 mg/m2 (original dose 85 mg/m2, Cycle 3, Reason: Provider Judgment) Administration: 165 mg (12/28/2018), 160 mg (01/11/2019), 125 mg (01/25/2019), 125 mg (02/21/2019), 125 mg (03/09/2019), 125 mg (03/21/2019), 125 mg (04/04/2019), 125 mg (04/18/2019), 125 mg (05/02/2019) fluorouracil (ADRUCIL) chemo injection 750 mg, 400 mg/m2 = 750 mg, Intravenous,  Once, 9 of 9 cycles Administration: 750 mg (12/28/2018), 750 mg (01/11/2019), 750 mg (01/25/2019), 750  mg (02/21/2019), 750 mg (03/09/2019), 750 mg (03/21/2019), 750 mg (04/04/2019), 750 mg (04/18/2019), 750 mg (05/02/2019) fluorouracil (ADRUCIL) 4,600 mg in  sodium chloride 0.9 % 58 mL chemo infusion, 2,400 mg/m2 = 4,600 mg, Intravenous, 1 Day/Dose, 12 of 12 cycles Administration: 4,600 mg (12/28/2018), 4,550 mg (01/11/2019), 4,550 mg (01/25/2019), 4,550 mg (02/21/2019), 4,550 mg (03/09/2019), 4,550 mg (03/21/2019), 4,550 mg (04/04/2019), 4,550 mg (04/18/2019), 4,550 mg (05/02/2019), 4,550 mg (05/16/2019), 4,550 mg (05/30/2019), 4,550 mg (06/15/2019)  for chemotherapy treatment.    11/01/2019 -  Chemotherapy   The patient had palonosetron (ALOXI) injection 0.25 mg, 0.25 mg, Intravenous,  Once, 4 of 6 cycles Administration: 0.25 mg (11/01/2019), 0.25 mg (11/15/2019), 0.25 mg (11/29/2019), 0.25 mg (12/13/2019) pegfilgrastim-cbqv (UDENYCA) injection 6 mg, 6 mg, Subcutaneous, Once, 4 of 6 cycles Administration: 6 mg (11/03/2019), 6 mg (11/17/2019), 6 mg (12/01/2019), 6 mg (12/15/2019) irinotecan (CAMPTOSAR) 320 mg in sodium chloride 0.9 % 500 mL chemo infusion, 180 mg/m2 = 320 mg, Intravenous,  Once, 4 of 6 cycles Administration: 320 mg (11/01/2019), 320 mg (11/15/2019), 320 mg (11/29/2019), 320 mg (12/13/2019) fluorouracil (ADRUCIL) chemo injection 700 mg, 400 mg/m2 = 700 mg, Intravenous,  Once, 4 of 6 cycles Administration: 700 mg (11/01/2019), 700 mg (11/15/2019), 700 mg (11/29/2019), 700 mg (12/13/2019) fluorouracil (ADRUCIL) 4,300 mg in sodium chloride 0.9 % 64 mL chemo infusion, 2,400 mg/m2 = 4,300 mg, Intravenous, 1 Day/Dose, 4 of 6 cycles Administration: 4,300 mg (11/01/2019), 4,300 mg (11/15/2019), 4,300 mg (11/29/2019), 4,300 mg (12/13/2019) bevacizumab-bvzr (ZIRABEV) 300 mg in sodium chloride 0.9 % 100 mL chemo infusion, 5 mg/kg = 300 mg, Intravenous,  Once, 4 of 6 cycles Administration: 300 mg (11/01/2019), 300 mg (11/15/2019), 300 mg (11/29/2019), 300 mg (12/13/2019) leucovorin 716 mg in sodium chloride 0.9 % 250 mL infusion, 400 mg/m2 = 716 mg, Intravenous,  Once, 4 of 6 cycles Administration: 716 mg (11/01/2019), 716 mg (11/15/2019), 716 mg (11/29/2019), 716 mg  (12/13/2019)  for chemotherapy treatment.      PHYSICAL EXAMINATION:  Vitals:   12/29/19 0600 12/29/19 0800  BP:    Pulse: 93   Resp: 17   Temp:  97.9 F (36.6 C)  SpO2: 94%    Filed Weights   12/28/19 1035  Weight: 63.5 kg    Intake/Output from previous day: 12/15 0701 - 12/16 0700 In: 1264.1 [P.O.:718; I.V.:546.1] Out: 1425 [Urine:1425]  GENERAL:alert, no distress and comfortable SKIN: skin color, texture, turgor are normal, no rashes or significant lesions OROPHARYNX: No thrush or mucositis LUNGS: clear to auscultation and percussion with normal breathing effort HEART: regular rate & rhythm and no murmurs ABDOMEN:abdomen soft, non-tender and normal bowel sounds NEURO: alert & oriented x 3, no focal motor/sensory deficits  Port-A-Cath without erythema  LABORATORY DATA:  I have reviewed the data as listed CMP Latest Ref Rng & Units 12/28/2019 12/27/2019 12/27/2019  Glucose 70 - 99 mg/dL 90 104(H) 104(H)  BUN 6 - 20 mg/dL '9 12 12  ' Creatinine 0.61 - 1.24 mg/dL 1.33(H) 1.33(H) 1.52(H)  Sodium 135 - 145 mmol/L 139 136 140  Potassium 3.5 - 5.1 mmol/L 3.9 4.1 4.0  Chloride 98 - 111 mmol/L 105 105 108  CO2 22 - 32 mmol/L '24 24 27  ' Calcium 8.9 - 10.3 mg/dL 8.6(L) 8.9 9.5  Total Protein 6.5 - 8.1 g/dL 6.5 7.3 7.4  Total Bilirubin 0.3 - 1.2 mg/dL 0.4 0.4 0.5  Alkaline Phos 38 - 126 U/L 68 80 84  AST 15 - 41 U/L '15 19 17  ' ALT 0 -  44 U/L '14 16 16    ' Lab Results  Component Value Date   WBC 7.4 12/29/2019   HGB 11.6 (L) 12/29/2019   HCT 37.9 (L) 12/29/2019   MCV 86.1 12/29/2019   PLT 231 12/29/2019   NEUTROABS 5.1 12/28/2019    CT HEAD WO CONTRAST  Result Date: 12/27/2019 CLINICAL DATA:  Metastatic colon cancer.  Staging examination EXAM: CT HEAD WITHOUT CONTRAST TECHNIQUE: Contiguous axial images were obtained from the base of the skull through the vertex without intravenous contrast. COMPARISON:  None. FINDINGS: Brain: Normal anatomic configuration. No abnormal  intra or extra-axial mass lesion or fluid collection. No abnormal mass effect or midline shift. No evidence of acute intracranial hemorrhage or infarct. Ventricular size is normal. Cerebellum unremarkable. Vascular: Unremarkable Skull: Intact Sinuses/Orbits: Paranasal sinuses are clear. Orbits are unremarkable. Other: Mastoid air cells and middle ear cavities are clear. IMPRESSION: Normal examination.  No evidence of intracranial metastatic disease. Electronically Signed   By: Fidela Salisbury MD   On: 12/27/2019 19:06   CT ANGIO CHEST PE W OR WO CONTRAST  Result Date: 12/27/2019 CLINICAL DATA:  History of colon cancer with acute onset of dyspnea and tachycardia. EXAM: CT ANGIOGRAPHY CHEST WITH CONTRAST TECHNIQUE: Multidetector CT imaging of the chest was performed using the standard protocol during bolus administration of intravenous contrast. Multiplanar CT image reconstructions and MIPs were obtained to evaluate the vascular anatomy. CONTRAST:  119m OMNIPAQUE IOHEXOL 350 MG/ML SOLN COMPARISON:  Chest CT 09/29/2019 FINDINGS: Cardiovascular: The heart is within normal limits in size and stable. No pericardial effusion. The aorta is normal in caliber. No dissection or atherosclerotic calcifications. The branch vessels are patent. No definite coronary artery calcifications. The pulmonary arterial tree is fairly well opacified. There are significant central and peripheral filling defects consistent with sub massive pulmonary embolism. Evidence of right heart strain with flattening of the left ventricle. The RV LV ratio is 1.18. Mediastinum/Nodes: No mediastinal or hilar mass or adenopathy. The esophagus is grossly normal. Lungs/Pleura: No worrisome pulmonary nodules to suggest pulmonary metastatic disease. No infiltrates or effusions. No pulmonary edema. Upper Abdomen: Hepatic metastatic disease is again demonstrated. No significant change. Musculoskeletal: No significant bony findings.  Moderate scoliosis. Review  of the MIP images confirms the above findings. IMPRESSION: 1. Positive for sub massive pulmonary embolism with CT evidence of right heart strain (RV/LV Ratio 1.18) consistent with at least submassive (intermediate risk) PE. The presence of right heart strain has been associated with an increased risk of morbidity and mortality. 2. Normal thoracic aorta. 3. No worrisome pulmonary nodules or infiltrates. 4. Hepatic metastatic disease. These results were called by telephone at the time of interpretation on 12/27/2019 at 2:50 pm to provider Dr. SCarin Hocknurse, SManuela Schwartz Who verbally acknowledged these results. Electronically Signed   By: PMarijo SanesM.D.   On: 12/27/2019 14:50   ECHOCARDIOGRAM COMPLETE  Result Date: 12/28/2019    ECHOCARDIOGRAM REPORT   Patient Name:   Alexander CALICOSr. Date of Exam: 12/28/2019 Medical Rec #:  [id number]           Height:       70.0 in Accession #:    [id number]          Weight:       140.1 lb Date of Birth:  112/26/1967           BSA:          1.794 m Patient Age:    89years  BP:           107/74 mmHg Patient Gender: M                    HR:           95 bpm. Exam Location:  Inpatient Procedure: 2D Echo Indications:    Pulmonary Embolism; Dyspnea R06.00  History:        Patient has no prior history of Echocardiogram examinations.  Sonographer:    Mikki Santee RDCS (AE) Referring Phys: [place]  1. Left ventricular ejection fraction, by estimation, is 60 to 65%. The left ventricle has normal function. The left ventricle has no regional wall motion abnormalities. Left ventricular diastolic function could not be evaluated.  2. Right ventricular systolic function is moderately reduced. The right ventricular size is normal. A Prominent moderator band is visualized. Tricuspid regurgitation signal is inadequate for assessing PA pressure.  3. Right atrial size was mildly dilated.  4. The pericardial effusion is posterior to the left  ventricle.  5. The mitral valve is degenerative. No evidence of mitral valve regurgitation. No evidence of mitral stenosis.  6. The aortic valve was not well visualized. Aortic valve regurgitation is not visualized. Mild aortic valve sclerosis is present, with no evidence of aortic valve stenosis.  7. The inferior vena cava is normal in size with greater than 50% respiratory variability, suggesting right atrial pressure of 3 mmHg. FINDINGS  Left Ventricle: Left ventricular ejection fraction, by estimation, is 60 to 65%. The left ventricle has normal function. The left ventricle has no regional wall motion abnormalities. The left ventricular internal cavity size was normal in size. There is  no left ventricular hypertrophy. Left ventricular diastolic function could not be evaluated. Right Ventricle: The right ventricular size is normal. No increase in right ventricular wall thickness. Right ventricular systolic function is moderately reduced. Tricuspid regurgitation signal is inadequate for assessing PA pressure. Left Atrium: Left atrial size was normal in size. Right Atrium: Right atrial size was mildly dilated. Pericardium: Trivial pericardial effusion is present. The pericardial effusion is posterior to the left ventricle. Mitral Valve: The mitral valve is degenerative in appearance. There is mild calcification of the mitral valve leaflet(s). Mild mitral annular calcification. No evidence of mitral valve regurgitation. No evidence of mitral valve stenosis. Tricuspid Valve: The tricuspid valve is normal in structure. Tricuspid valve regurgitation is mild . No evidence of tricuspid stenosis. Aortic Valve: The aortic valve was not well visualized. Aortic valve regurgitation is not visualized. Mild aortic valve sclerosis is present, with no evidence of aortic valve stenosis. Pulmonic Valve: The pulmonic valve was normal in structure. Pulmonic valve regurgitation is not visualized. No evidence of pulmonic stenosis.  Aorta: The aortic root is normal in size and structure. Venous: The inferior vena cava is normal in size with greater than 50% respiratory variability, suggesting right atrial pressure of 3 mmHg. IAS/Shunts: No atrial level shunt detected by color flow Doppler. Additional Comments: A Prominent moderator band is visualized.  LEFT VENTRICLE PLAX 2D LVIDd:         3.30 cm  Diastology LVIDs:         2.20 cm  LV e' medial:  6.20 cm/s LV PW:         1.00 cm  LV e' lateral: 6.20 cm/s LV IVS:        1.00 cm LVOT diam:     2.40 cm LV SV:  55 LV SV Index:   31 LVOT Area:     4.52 cm  RIGHT VENTRICLE TAPSE (M-mode): 2.0 cm LEFT ATRIUM           Index      RIGHT ATRIUM           Index LA diam:      1.60 cm 0.89 cm/m RA Area:     17.90 cm LA Vol (A4C): 14.3 ml 7.97 ml/m RA Volume:   52.60 ml  29.32 ml/m  AORTIC VALVE LVOT Vmax:   60.80 cm/s LVOT Vmean:  42.300 cm/s LVOT VTI:    0.122 m  AORTA Ao Root diam: 3.40 cm  SHUNTS Systemic VTI:  0.12 m Systemic Diam: 2.40 cm Fransico Him MD Electronically signed by Fransico Him MD Signature Date/Time: 12/28/2019/11:42:41 AM    Final    VAS [place] LOWER EXTREMITY VENOUS (DVT)  Result Date: 12/27/2019  Lower Venous DVT Study Indications: Swelling.  Risk Factors: Cancer. Comparison Study: No prior studies. Performing Technologist: Oliver Hum RVT  Examination Guidelines: A complete evaluation includes B-mode imaging, spectral Doppler, color Doppler, and power Doppler as needed of all accessible portions of each vessel. Bilateral testing is considered an integral part of a complete examination. Limited examinations for reoccurring indications may be performed as noted. The reflux portion of the exam is performed with the patient in reverse Trendelenburg.  +-----+---------------+---------+-----------+----------+--------------+ RIGHTCompressibilityPhasicitySpontaneityPropertiesThrombus Aging +-----+---------------+---------+-----------+----------+--------------+ CFV   Full           Yes      Yes                                 +-----+---------------+---------+-----------+----------+--------------+   +---------+---------------+---------+-----------+----------+--------------+ LEFT     CompressibilityPhasicitySpontaneityPropertiesThrombus Aging +---------+---------------+---------+-----------+----------+--------------+ CFV      Full           Yes      Yes                                 +---------+---------------+---------+-----------+----------+--------------+ SFJ      Full                                                        +---------+---------------+---------+-----------+----------+--------------+ FV Prox  Full                                                        +---------+---------------+---------+-----------+----------+--------------+ FV Mid   Full                                                        +---------+---------------+---------+-----------+----------+--------------+ FV DistalFull                                                        +---------+---------------+---------+-----------+----------+--------------+  PFV      Full                                                        +---------+---------------+---------+-----------+----------+--------------+ POP      None           No       No                   Acute          +---------+---------------+---------+-----------+----------+--------------+ PTV      Partial                                      Acute          +---------+---------------+---------+-----------+----------+--------------+ PERO     None                                         Acute          +---------+---------------+---------+-----------+----------+--------------+     Summary: RIGHT: - No evidence of common femoral vein obstruction.  LEFT: - Findings consistent with acute deep vein thrombosis involving the left popliteal vein, left posterior tibial veins, and left peroneal  veins. - No cystic structure found in the popliteal fossa.  *See table(s) above for measurements and observations. Electronically signed by Deitra Mayo MD on 12/27/2019 at 5:43:22 PM.    Final     ASSESSMENT AND PLAN: 1.Moderately differentiated adenocarcinoma ascending colon, stage IIIb (pT3pN1), status post a right colectomy 11/19/2018 ? Lymphovascular and perineural invasion present, 2/14 lymph nodes positive, tumor deposits present ? Positive radial margin, no loss of mismatch repair protein expression, discussed case with Dr. Craig Staggers mass with surrounding inflammation at multiple margins, no gross residual disease, unclear which is the "positive "radial margin, further surgery and radiation not recommended ? Colonoscopy 11/19/2018-completely obstructing mid ascending colon mass, could not be passed with endoscope, biopsy confirmed invasive adenocarcinoma ? CT abdomen/pelvis 11/18/2018-wall thickening at the mid and distal ascending colon with mild distention of the proximal ascending colon and cecum ? CTs 10/17/2018--no acute findings, no chest lymphadenopathy, lungs clear ? Cycle 1 FOLFOX 12/28/2018 ? Cycle 2 FOLFOX 01/11/2019, Udenyca added ? Cycle 3 FOLFOX 01/25/2019,Udenyca held due to bone pain ? Cycle 4 FOLFOX 02/21/2019, Udenyca added ? Cycle 5 FOLFOX 03/09/2019, Udenyca ? Cycle 6 FOLFOX 03/21/2019, Udenyca ? Cycle 7 FOLFOX 04/04/2019, Udenyca ? Cycle 8 FOLFOX 04/18/2019, Udenyca ? Cycle 9 FOLFOX 05/02/2019, Udenyca ? Cycle 10 FOLFOX 05/16/2019, oxaliplatin, 5-FU bolus, and Udenyca held ? Cycle 11 FOLFOX 05/30/2019, oxaliplatin, 5-FU bolus, and Udenyca held ? Cycle 12 FOLFOX 06/15/2019, oxaliplatin, 5-FU bolus and Udenyca held ? CTs 09/29/2019-new hypodense enhancing liver masses consistent with metastatic disease, indistinct marginated nodularity below the pancreas head-likely small lymph nodes ? Biopsy liver lesion 10/18/2019-adenocarcinoma consistent with history of colorectal  carcinoma ? Cycle 1 FOLFIRI/Avastin 11/01/2019 ? Cycle 2 FOLFIRI/Avastin 11/15/2019 ? Cycle 3 FOLFIRI/Avastin 11/29/2019 ? Cycle 4 FOLFIRI/Avastin 12/13/2019  2. [place] 3. Right epididymal cyst removal 03/22/2018 4. Asthma 5. Port-A-Cath placement, [name], 12/23/2018 6. Neutropenia secondary to chemotherapy-Udenyca added for cycle 2 FOLFOX 7. Admission with febrile neutropenia  02/08/2019 8.  Hospital admission with submassive PE and left lower extremity DVT on 12/27/2019, heparin, transition to Xarelto 12/28/2019  Alexander Reeves appears stable.  Respiratory status is stable and he is tolerating Xarelto well.  No bleeding reported.  PCCM recommends Xarelto indefinitely.  He is on Avastin which likely contributed to his PE.  The patient reports some burning with urination today.  UA and urine culture have been ordered.  Remains afebrile WBC is normal.  We will follow up on these results once they are available to [place].  CTA chest formed admission.  Liver lesions appear stable.  Will independently review.  Recommendations: 1.  Continue Xarelto for PE. 2.  Review CT independently to compare liver lesions to prior CT. 3.  We will plan to see as an outpatient for evaluation for chemotherapy.   4.  UA with culture and sensitivity we will follow up on results.  Urine cultures have been negative in the past.  The patient is stable for discharge from our standpoint.  We will see him as an outpatient as previously scheduled on 01/10/2020.   LOS: 1 day   Mikey Bussing, DNP, AGPCNP-BC, AOCNP 12/29/19 Alexander Reeves was interviewed and examined.  The tachycardia has improved.  He is now maintained on Xarelto anticoagulation.  The plan is to continue anticoagulation indefinitely in the setting of metastatic colon cancer. I reviewed the chest CT images and the liver lesions do not appear significantly changed.  The plan is to resume FOLFIRI chemotherapy as an outpatient.

## 2019-12-30 LAB — URINE CULTURE: Culture: NO GROWTH

## 2019-12-30 MED ORDER — HEPARIN SOD (PORK) LOCK FLUSH 100 UNIT/ML IV SOLN
500.0000 [IU] | INTRAVENOUS | Status: AC | PRN
  Administered 2019-12-30: 11:00:00 500 [IU]

## 2019-12-30 NOTE — Progress Notes (Signed)
Pt discharged per Dr Reesa Chew. Reviewed discharge instructions using sign language interpreter. Reviewed all discharge instructions including follow up appts and medications. Pt confirms understanding. Pt says he needs to pick up his protein drinks from cancer center before he can leave. Call placed to cancer center to locate drinks and patient will call transportation.

## 2019-12-30 NOTE — Progress Notes (Signed)
[facility] PCCM  RUE vasc ultrasound negative  OK to d/c home from our perspective  No new recommendations  Roselie Awkward, MD [place] [facility]M Pager: [phone] Cell: [phone] If no response, call [phone]

## 2020-01-02 ENCOUNTER — Telehealth: Payer: Self-pay

## 2020-01-02 NOTE — Telephone Encounter (Signed)
Transition Care Management Follow-up Telephone Call Call completed with patient and video relay interpreter  Date of discharge and from where: 12/30/2019, [facility]   How have you been since you were released from the hospital? No concerns reported.  Any questions or concerns? Yes - he is interested in compression stockings and will discuss with PCP at upcoming appointment   Items Reviewed:  Did the pt receive and understand the discharge instructions provided? Yes   Medications obtained and verified? Yes he said he has all medications and the hospital staff reviewed the instructions in detail prior to his discharge. He did not have any questions about the med regime,   Other? No   Any new allergies since your discharge? No   Do you have support at home? Yes , his wife and daughter  Home Care and Equipment/Supplies: Were home health services ordered?no If so, what is the name of the agency? n/a Has the agency set up a time to come to the patient's home? n/a Were any new equipment or medical supplies ordered?  No What is the name of the medical supply agency? n/a Were you able to get the supplies/equipment? n/a Do you have any questions related to the use of the equipment or supplies? No, n/a  He already has a nebulizer  Functional Questionnaire: (I = Independent and D = Dependent) ADLs:independent  Follow up appointments reviewed:   PCP Hospital f/u appt confirmed? Yes  - Juluis Mire, NP 01/17/2020  Specialist Hospital f/u appt confirmed? Yes - appointments with oncology next week.  Are transportation arrangements needed? No   If their condition worsens, is the pt aware to call PCP or go to the Emergency Dept.? Yes  Was the patient provided with contact information for the PCP's office or ED?  yes  Was to pt encouraged to call back with questions or concerns?  yes

## 2020-01-10 ENCOUNTER — Other Ambulatory Visit: Payer: Self-pay

## 2020-01-10 ENCOUNTER — Inpatient Hospital Stay: Payer: Medicare Other

## 2020-01-10 ENCOUNTER — Inpatient Hospital Stay (HOSPITAL_BASED_OUTPATIENT_CLINIC_OR_DEPARTMENT_OTHER): Payer: Medicare Other | Admitting: Oncology

## 2020-01-10 VITALS — BP 125/82 | HR 99 | Temp 97.5°F | Resp 16 | Ht 70.0 in | Wt 147.3 lb

## 2020-01-10 DIAGNOSIS — Z5111 Encounter for antineoplastic chemotherapy: Secondary | ICD-10-CM | POA: Diagnosis not present

## 2020-01-10 DIAGNOSIS — C779 Secondary and unspecified malignant neoplasm of lymph node, unspecified: Secondary | ICD-10-CM | POA: Diagnosis not present

## 2020-01-10 DIAGNOSIS — C182 Malignant neoplasm of ascending colon: Secondary | ICD-10-CM

## 2020-01-10 DIAGNOSIS — Z79899 Other long term (current) drug therapy: Secondary | ICD-10-CM | POA: Diagnosis not present

## 2020-01-10 DIAGNOSIS — Z95828 Presence of other vascular implants and grafts: Secondary | ICD-10-CM

## 2020-01-10 DIAGNOSIS — C787 Secondary malignant neoplasm of liver and intrahepatic bile duct: Secondary | ICD-10-CM | POA: Diagnosis not present

## 2020-01-10 DIAGNOSIS — D701 Agranulocytosis secondary to cancer chemotherapy: Secondary | ICD-10-CM | POA: Diagnosis not present

## 2020-01-10 DIAGNOSIS — C189 Malignant neoplasm of colon, unspecified: Secondary | ICD-10-CM

## 2020-01-10 LAB — CBC WITH DIFFERENTIAL (CANCER CENTER ONLY)
Abs Immature Granulocytes: 0.02 10*3/uL (ref 0.00–0.07)
Basophils Absolute: 0.1 10*3/uL (ref 0.0–0.1)
Basophils Relative: 1 %
Eosinophils Absolute: 0.3 10*3/uL (ref 0.0–0.5)
Eosinophils Relative: 5 %
HCT: 35.9 % — ABNORMAL LOW (ref 39.0–52.0)
Hemoglobin: 10.8 g/dL — ABNORMAL LOW (ref 13.0–17.0)
Immature Granulocytes: 0 %
Lymphocytes Relative: 27 %
Lymphs Abs: 1.8 10*3/uL (ref 0.7–4.0)
MCH: 26.6 pg (ref 26.0–34.0)
MCHC: 30.1 g/dL (ref 30.0–36.0)
MCV: 88.4 fL (ref 80.0–100.0)
Monocytes Absolute: 0.6 10*3/uL (ref 0.1–1.0)
Monocytes Relative: 10 %
Neutro Abs: 3.7 10*3/uL (ref 1.7–7.7)
Neutrophils Relative %: 57 %
Platelet Count: 220 10*3/uL (ref 150–400)
RBC: 4.06 MIL/uL — ABNORMAL LOW (ref 4.22–5.81)
RDW: 18.4 % — ABNORMAL HIGH (ref 11.5–15.5)
WBC Count: 6.5 10*3/uL (ref 4.0–10.5)
nRBC: 0 % (ref 0.0–0.2)

## 2020-01-10 LAB — CMP (CANCER CENTER ONLY)
ALT: 13 U/L (ref 0–44)
AST: 16 U/L (ref 15–41)
Albumin: 3.7 g/dL (ref 3.5–5.0)
Alkaline Phosphatase: 48 U/L (ref 38–126)
Anion gap: 4 — ABNORMAL LOW (ref 5–15)
BUN: 16 mg/dL (ref 6–20)
CO2: 26 mmol/L (ref 22–32)
Calcium: 9.3 mg/dL (ref 8.9–10.3)
Chloride: 109 mmol/L (ref 98–111)
Creatinine: 1.31 mg/dL — ABNORMAL HIGH (ref 0.61–1.24)
GFR, Estimated: >60 mL/min (ref >60)
Glucose, Bld: 77 mg/dL (ref 70–99)
Potassium: 4.1 mmol/L (ref 3.5–5.1)
Sodium: 139 mmol/L (ref 135–145)
Total Bilirubin: 0.3 mg/dL (ref 0.3–1.2)
Total Protein: 6.8 g/dL (ref 6.5–8.1)

## 2020-01-10 MED ORDER — PALONOSETRON HCL INJECTION 0.25 MG/5ML
0.2500 mg | Freq: Once | INTRAVENOUS | Status: AC
  Administered 2020-01-10: 10:00:00 0.25 mg via INTRAVENOUS

## 2020-01-10 MED ORDER — ATROPINE SULFATE 1 MG/ML IJ SOLN
0.5000 mg | Freq: Once | INTRAMUSCULAR | Status: AC | PRN
  Administered 2020-01-10: 11:00:00 0.5 mg via INTRAVENOUS

## 2020-01-10 MED ORDER — SODIUM CHLORIDE 0.9 % IV SOLN
2400.0000 mg/m2 | INTRAVENOUS | Status: DC
  Administered 2020-01-10: 13:00:00 4300 mg via INTRAVENOUS
  Filled 2020-01-10: qty 86

## 2020-01-10 MED ORDER — SODIUM CHLORIDE 0.9 % IV SOLN
Freq: Once | INTRAVENOUS | Status: DC
  Filled 2020-01-10: qty 250

## 2020-01-10 MED ORDER — SODIUM CHLORIDE 0.9% FLUSH
10.0000 mL | INTRAVENOUS | Status: DC | PRN
  Administered 2020-01-10: 09:00:00 10 mL
  Filled 2020-01-10: qty 10

## 2020-01-10 MED ORDER — SODIUM CHLORIDE 0.9 % IV SOLN
180.0000 mg/m2 | Freq: Once | INTRAVENOUS | Status: AC
  Administered 2020-01-10: 11:00:00 320 mg via INTRAVENOUS
  Filled 2020-01-10: qty 15

## 2020-01-10 MED ORDER — ATROPINE SULFATE 1 MG/ML IJ SOLN
INTRAMUSCULAR | Status: AC
  Filled 2020-01-10: qty 1

## 2020-01-10 MED ORDER — FLUOROURACIL CHEMO INJECTION 2.5 GM/50ML
400.0000 mg/m2 | Freq: Once | INTRAVENOUS | Status: AC
  Administered 2020-01-10: 13:00:00 700 mg via INTRAVENOUS
  Filled 2020-01-10: qty 14

## 2020-01-10 MED ORDER — ATROPINE SULFATE 0.4 MG/ML IJ SOLN
INTRAMUSCULAR | Status: AC
  Filled 2020-01-10: qty 1

## 2020-01-10 MED ORDER — PALONOSETRON HCL INJECTION 0.25 MG/5ML
INTRAVENOUS | Status: AC
  Filled 2020-01-10: qty 5

## 2020-01-10 MED ORDER — SODIUM CHLORIDE 0.9 % IV SOLN
10.0000 mg | Freq: Once | INTRAVENOUS | Status: AC
  Administered 2020-01-10: 10:00:00 10 mg via INTRAVENOUS
  Filled 2020-01-10: qty 1
  Filled 2020-01-10: qty 10

## 2020-01-10 MED ORDER — SODIUM CHLORIDE 0.9 % IV SOLN
400.0000 mg/m2 | Freq: Once | INTRAVENOUS | Status: AC
  Administered 2020-01-10: 11:00:00 716 mg via INTRAVENOUS
  Filled 2020-01-10: qty 35.8

## 2020-01-10 MED ORDER — ALTEPLASE 2 MG IJ SOLR
2.0000 mg | Freq: Once | INTRAMUSCULAR | Status: DC | PRN
  Filled 2020-01-10: qty 2

## 2020-01-10 MED ORDER — SODIUM CHLORIDE 0.9 % IV SOLN
Freq: Once | INTRAVENOUS | Status: AC
  Filled 2020-01-10: qty 250

## 2020-01-10 NOTE — Patient Instructions (Signed)
[facility] Discharge Instructions for Patients Receiving Chemotherapy  Today you received the following chemotherapy agents:  Irinotecan, 5FU, and Leucovorin.    To help prevent nausea and vomiting after your treatment, we encourage you to take your nausea medication as directed.   If you develop nausea and vomiting that is not controlled by your nausea medication, call the clinic.   BELOW ARE SYMPTOMS THAT SHOULD BE REPORTED IMMEDIATELY:  *FEVER GREATER THAN 100.5 F  *CHILLS WITH OR WITHOUT FEVER  NAUSEA AND VOMITING THAT IS NOT CONTROLLED WITH YOUR NAUSEA MEDICATION  *UNUSUAL SHORTNESS OF BREATH  *UNUSUAL BRUISING OR BLEEDING  TENDERNESS IN MOUTH AND THROAT WITH OR WITHOUT PRESENCE OF ULCERS  *URINARY PROBLEMS  *BOWEL PROBLEMS  UNUSUAL RASH Items with * indicate a potential emergency and should be followed up as soon as possible.  Feel free to call the clinic should you have any questions or concerns. The clinic phone number is [phone].  Please show the CHEMO ALERT CARD at check-in to the Emergency Department and triage nurse.

## 2020-01-10 NOTE — Progress Notes (Signed)
[place] OFFICE PROGRESS NOTE   Diagnosis: Colon cancer  INTERVAL HISTORY:   Alexander Reeves returns for a scheduled visit.  He was discharged from the hospital on 12/29/2019 after admission with symptomatic pulmonary emboli.  He is maintained on Xarelto anticoagulation.  He reports improvement in left leg pain.  No dyspnea.  No bleeding.  He is here today with a sign language interpreter.  Objective:  Vital signs in last 24 hours:  Blood pressure 125/82, pulse 99, temperature (!) 97.5 F (36.4 C), temperature source Tympanic, resp. rate 16, height '5\' 10"'  (1.778 m), weight 147 lb 4.8 oz (66.8 kg), SpO2 99 %.    HEENT: No thrush or ulcers Resp: Lungs clear bilaterally Cardio: Regular rate and rhythm GI: No hepatosplenomegaly, nontender, no mass Vascular: No leg edema    Portacath/PICC-without erythema  Lab Results:  Lab Results  Component Value Date   WBC 6.5 01/10/2020   HGB 10.8 (L) 01/10/2020   HCT 35.9 (L) 01/10/2020   MCV 88.4 01/10/2020   PLT 220 01/10/2020   NEUTROABS 3.7 01/10/2020    CMP  Lab Results  Component Value Date   NA 139 01/10/2020   K 4.1 01/10/2020   CL 109 01/10/2020   CO2 26 01/10/2020   GLUCOSE 77 01/10/2020   BUN 16 01/10/2020   CREATININE 1.31 (H) 01/10/2020   CALCIUM 9.3 01/10/2020   PROT 6.8 01/10/2020   ALBUMIN 3.7 01/10/2020   AST 16 01/10/2020   ALT 13 01/10/2020   ALKPHOS 48 01/10/2020   BILITOT 0.3 01/10/2020   GFRNONAA >60 01/10/2020   GFRAA >60 09/28/2019    Lab Results  Component Value Date   CEA1 1.75 09/28/2019    Medications: I have reviewed the patient's current medications.   Assessment/Plan: 1.Moderately differentiated adenocarcinoma ascending colon, stage IIIb (pT3pN1), status post a right colectomy 11/19/2018 ? Lymphovascular and perineural invasion present, 2/14 lymph nodes positive, tumor deposits present ? Positive radial margin, no loss of mismatch repair protein expression, discussed  case with Dr. Craig Staggers mass with surrounding inflammation at multiple margins, no gross residual disease, unclear which is the "positive "radial margin, further surgery and radiation not recommended ? Colonoscopy 11/19/2018-completely obstructing mid ascending colon mass, could not be passed with endoscope, biopsy confirmed invasive adenocarcinoma ? CT abdomen/pelvis 11/18/2018-wall thickening at the mid and distal ascending colon with mild distention of the proximal ascending colon and cecum ? CTs 10/17/2018--no acute findings, no chest lymphadenopathy, lungs clear ? Cycle 1 FOLFOX 12/28/2018 ? Cycle 2 FOLFOX 01/11/2019, Udenyca added ? Cycle 3 FOLFOX 01/25/2019,Udenyca held due to bone pain ? Cycle 4 FOLFOX 02/21/2019, Udenyca added ? Cycle 5 FOLFOX 03/09/2019, Udenyca ? Cycle 6 FOLFOX 03/21/2019, Udenyca ? Cycle 7 FOLFOX 04/04/2019, Udenyca ? Cycle 8 FOLFOX 04/18/2019, Udenyca ? Cycle 9 FOLFOX 05/02/2019, Udenyca ? Cycle 10 FOLFOX 05/16/2019, oxaliplatin, 5-FU bolus, and Udenyca held ? Cycle 11 FOLFOX 05/30/2019, oxaliplatin, 5-FU bolus, and Udenyca held ? Cycle 12 FOLFOX 06/15/2019, oxaliplatin, 5-FU bolus and Udenyca held ? CTs 09/29/2019-new hypodense enhancing liver masses consistent with metastatic disease, indistinct marginated nodularity below the pancreas head-likely small lymph nodes ? Biopsy liver lesion 10/18/2019-adenocarcinoma consistent with history of colorectal carcinoma ? Cycle 1 FOLFIRI/Avastin 11/01/2019 ? Cycle 2 FOLFIRI/Avastin 11/15/2019 ? Cycle 3 FOLFIRI/Avastin 11/29/2019 ? Cycle 4 FOLFIRI/Avastin 12/13/2019 ? CT chest 12/27/2019-no significant change in hepatic metastases, submassive pulmonary emboli ? Cycle 5 FOLFIRI 01/10/2020 (Avastin discontinued)  2. Deaf 3. Right epididymal cyst removal 03/22/2018 4. Asthma 5. Port-A-Cath  placement, [name], 12/23/2018 6. Neutropenia secondary to chemotherapy-Udenyca added for cycle 2 FOLFOX 7. Admission with febrile  neutropenia 02/08/2019 8.  Hospital admission with submassive PE and left lower extremity DVT on 12/27/2019, heparin, transition to Xarelto 12/28/2019  CT chest 12/27/2019-submassive pulmonary emboli with evidence of right heart strain, no significant change in hepatic metastases    Disposition: [name] appears well.  He has recovered from the admission with acute pulmonary embolism.  He continues Xarelto anticoagulation.  Avastin will be discontinued from the chemotherapy regimen.  I reviewed the chest CT images from 12/27/2019.  There was no significant change in the liver metastases.  The plan is to continue FOLFIRI for 4 additional cycles prior to a restaging CT abdomen/pelvis.  [name] will complete another cycle of FOLFIRI today.  He will return for an office visit and chemotherapy in 2 weeks.  Betsy Coder, MD  01/10/2020  9:43 AM

## 2020-01-11 ENCOUNTER — Telehealth: Payer: Self-pay | Admitting: Oncology

## 2020-01-11 NOTE — Telephone Encounter (Signed)
Scheduled appointments per 12/28 los. Spoke to patient who is aware of appointments dates and times.  

## 2020-01-12 ENCOUNTER — Other Ambulatory Visit: Payer: Self-pay

## 2020-01-12 ENCOUNTER — Inpatient Hospital Stay: Payer: Medicare Other

## 2020-01-12 VITALS — BP 120/67 | HR 95 | Resp 18

## 2020-01-12 DIAGNOSIS — C189 Malignant neoplasm of colon, unspecified: Secondary | ICD-10-CM

## 2020-01-12 DIAGNOSIS — D701 Agranulocytosis secondary to cancer chemotherapy: Secondary | ICD-10-CM | POA: Diagnosis not present

## 2020-01-12 DIAGNOSIS — Z79899 Other long term (current) drug therapy: Secondary | ICD-10-CM | POA: Diagnosis not present

## 2020-01-12 DIAGNOSIS — C787 Secondary malignant neoplasm of liver and intrahepatic bile duct: Secondary | ICD-10-CM | POA: Diagnosis not present

## 2020-01-12 DIAGNOSIS — C182 Malignant neoplasm of ascending colon: Secondary | ICD-10-CM | POA: Diagnosis not present

## 2020-01-12 DIAGNOSIS — C779 Secondary and unspecified malignant neoplasm of lymph node, unspecified: Secondary | ICD-10-CM | POA: Diagnosis not present

## 2020-01-12 DIAGNOSIS — Z5111 Encounter for antineoplastic chemotherapy: Secondary | ICD-10-CM | POA: Diagnosis not present

## 2020-01-12 MED ORDER — HEPARIN SOD (PORK) LOCK FLUSH 100 UNIT/ML IV SOLN
500.0000 [IU] | Freq: Once | INTRAVENOUS | Status: AC | PRN
  Administered 2020-01-12: 10:00:00 500 [IU]
  Filled 2020-01-12: qty 5

## 2020-01-12 MED ORDER — SODIUM CHLORIDE 0.9% FLUSH
10.0000 mL | INTRAVENOUS | Status: DC | PRN
  Administered 2020-01-12: 10:00:00 10 mL
  Filled 2020-01-12: qty 10

## 2020-01-12 MED ORDER — PEGFILGRASTIM-CBQV 6 MG/0.6ML ~~LOC~~ SOSY
PREFILLED_SYRINGE | SUBCUTANEOUS | Status: AC
  Filled 2020-01-12: qty 0.6

## 2020-01-12 MED ORDER — PEGFILGRASTIM-CBQV 6 MG/0.6ML ~~LOC~~ SOSY
6.0000 mg | PREFILLED_SYRINGE | Freq: Once | SUBCUTANEOUS | Status: AC
  Administered 2020-01-12: 10:00:00 6 mg via SUBCUTANEOUS

## 2020-01-12 NOTE — Patient Instructions (Signed)

## 2020-01-17 ENCOUNTER — Ambulatory Visit (INDEPENDENT_AMBULATORY_CARE_PROVIDER_SITE_OTHER): Payer: Medicare Other | Admitting: Primary Care

## 2020-01-19 ENCOUNTER — Other Ambulatory Visit: Payer: Self-pay | Admitting: Oncology

## 2020-01-19 ENCOUNTER — Telehealth: Payer: Self-pay | Admitting: *Deleted

## 2020-01-19 MED ORDER — RIVAROXABAN 20 MG PO TABS: 20.0000 mg | ORAL_TABLET | Freq: Every day | ORAL | 3 refills | Status: DC

## 2020-01-19 NOTE — Telephone Encounter (Signed)
Reports cramping in left leg for last two days when he is walking and is worried he has new blood clot. He is taking his Xarelto 20 mg daily and has not missed any doses. Denies that the leg is any more swollen than it has been. Denies chest pain or shortness of breath as well. Per Dr. Truett Perna: Highly unlikely he has a new blood clot. Call back tomorrow if not better or worsens. Informed him that leg cramps can be caused by various things. Suggested he elevate leg, put warm compress on it and push po fluids. Go to ER if he gets chest pain or shortness of breath or if leg turns red or starts swelling. Call tomorrow if needed. He understands and agrees.

## 2020-01-22 ENCOUNTER — Other Ambulatory Visit: Payer: Self-pay | Admitting: Oncology

## 2020-01-23 MED FILL — Dexamethasone Sodium Phosphate Inj 100 MG/10ML: INTRAMUSCULAR | Qty: 1 | Status: AC

## 2020-01-24 ENCOUNTER — Encounter: Payer: Self-pay | Admitting: Nurse Practitioner

## 2020-01-24 ENCOUNTER — Encounter: Payer: Self-pay | Admitting: Nutrition

## 2020-01-24 ENCOUNTER — Inpatient Hospital Stay: Payer: Medicare Other

## 2020-01-24 ENCOUNTER — Inpatient Hospital Stay: Payer: Medicare Other | Admitting: Nutrition

## 2020-01-24 ENCOUNTER — Other Ambulatory Visit: Payer: Self-pay

## 2020-01-24 ENCOUNTER — Telehealth: Payer: Self-pay

## 2020-01-24 ENCOUNTER — Inpatient Hospital Stay: Payer: Medicare Other | Attending: Nurse Practitioner

## 2020-01-24 ENCOUNTER — Inpatient Hospital Stay (HOSPITAL_BASED_OUTPATIENT_CLINIC_OR_DEPARTMENT_OTHER): Payer: Medicare Other | Admitting: Nurse Practitioner

## 2020-01-24 VITALS — BP 124/84 | HR 103 | Temp 97.8°F | Resp 18 | Ht 70.0 in | Wt 144.0 lb

## 2020-01-24 DIAGNOSIS — Z5111 Encounter for antineoplastic chemotherapy: Secondary | ICD-10-CM | POA: Diagnosis not present

## 2020-01-24 DIAGNOSIS — C787 Secondary malignant neoplasm of liver and intrahepatic bile duct: Secondary | ICD-10-CM | POA: Diagnosis not present

## 2020-01-24 DIAGNOSIS — H9190 Unspecified hearing loss, unspecified ear: Secondary | ICD-10-CM | POA: Insufficient documentation

## 2020-01-24 DIAGNOSIS — C182 Malignant neoplasm of ascending colon: Secondary | ICD-10-CM | POA: Diagnosis not present

## 2020-01-24 DIAGNOSIS — K6289 Other specified diseases of anus and rectum: Secondary | ICD-10-CM | POA: Diagnosis not present

## 2020-01-24 DIAGNOSIS — D701 Agranulocytosis secondary to cancer chemotherapy: Secondary | ICD-10-CM | POA: Diagnosis not present

## 2020-01-24 DIAGNOSIS — Z95828 Presence of other vascular implants and grafts: Secondary | ICD-10-CM

## 2020-01-24 DIAGNOSIS — I82402 Acute embolism and thrombosis of unspecified deep veins of left lower extremity: Secondary | ICD-10-CM | POA: Diagnosis not present

## 2020-01-24 DIAGNOSIS — Z7901 Long term (current) use of anticoagulants: Secondary | ICD-10-CM | POA: Insufficient documentation

## 2020-01-24 MED ORDER — SODIUM CHLORIDE 0.9% FLUSH
10.0000 mL | INTRAVENOUS | Status: DC | PRN
  Filled 2020-01-24: qty 10

## 2020-01-24 NOTE — Telephone Encounter (Signed)
Per L.Thomas called and made an urgent appt for today at 4pm with [facility] made [facility] aware patient will require interpretor services

## 2020-01-24 NOTE — Progress Notes (Addendum)
[place] OFFICE PROGRESS NOTE   Diagnosis: Colon cancer  INTERVAL HISTORY:   Alexander Reeves returns as scheduled.  He completed cycle 5 FOLFIRI 01/10/2020.  Avastin has been discontinued.  He reports onset of "hemorrhoid pain" yesterday.  He has had hemorrhoid surgery in the past.  He has been applying a cream with no improvement.  He wonders if the hemorrhoid is now bleeding.  He denies fever.  No nausea or vomiting.  Loose stools yesterday.  Objective:  Vital signs in last 24 hours:  Blood pressure 124/84, pulse (!) 103, temperature 97.8 F (36.6 C), temperature source Tympanic, resp. rate 18, height 5' 10" (1.778 m), weight 144 lb (65.3 kg), SpO2 100 %.    HEENT: No thrush or ulcers. Resp: Lungs clear bilaterally. Cardio: Regular rate and rhythm. GI: No hepatomegaly.  Question left perianal abscess/cyst with bloody/purulent drainage, marked associated tenderness. Vascular: No leg edema. Port-A-Cath without erythema.  Lab Results:  Lab Results  Component Value Date   WBC 6.5 01/10/2020   HGB 10.8 (L) 01/10/2020   HCT 35.9 (L) 01/10/2020   MCV 88.4 01/10/2020   PLT 220 01/10/2020   NEUTROABS 3.7 01/10/2020    Imaging:  No results found.  Medications: I have reviewed the patient's current medications.  Assessment/Plan: 1.Moderately differentiated adenocarcinoma ascending colon, stage IIIb (pT3pN1), status post a right colectomy 11/19/2018 ? Lymphovascular and perineural invasion present, 2/14 lymph nodes positive, tumor deposits present ? Positive radial margin, no loss of mismatch repair protein expression, discussed case with Dr. Craig Staggers mass with surrounding inflammation at multiple margins, no gross residual disease, unclear which is the "positive "radial margin, further surgery and radiation not recommended ? Colonoscopy 11/19/2018-completely obstructing mid ascending colon mass, could not be passed with endoscope, biopsy confirmed invasive  adenocarcinoma ? CT abdomen/pelvis 11/18/2018-wall thickening at the mid and distal ascending colon with mild distention of the proximal ascending colon and cecum ? CTs 10/17/2018--no acute findings, no chest lymphadenopathy, lungs clear ? Cycle 1 FOLFOX 12/28/2018 ? Cycle 2 FOLFOX 01/11/2019, Udenyca added ? Cycle 3 FOLFOX 01/25/2019,Udenyca held due to bone pain ? Cycle 4 FOLFOX 02/21/2019, Udenyca added ? Cycle 5 FOLFOX 03/09/2019, Udenyca ? Cycle 6 FOLFOX 03/21/2019, Udenyca ? Cycle 7 FOLFOX 04/04/2019, Udenyca ? Cycle 8 FOLFOX 04/18/2019, Udenyca ? Cycle 9 FOLFOX 05/02/2019, Udenyca ? Cycle 10 FOLFOX 05/16/2019, oxaliplatin, 5-FU bolus, and Udenyca held ? Cycle 11 FOLFOX 05/30/2019, oxaliplatin, 5-FU bolus, and Udenyca held ? Cycle 12 FOLFOX 06/15/2019, oxaliplatin, 5-FU bolus and Udenyca held ? CTs 09/29/2019-new hypodense enhancing liver masses consistent with metastatic disease, indistinct marginated nodularity below the pancreas head-likely small lymph nodes ? Biopsy liver lesion 10/18/2019-adenocarcinoma consistent with history of colorectal carcinoma ? Cycle 1 FOLFIRI/Avastin 11/01/2019 ? Cycle 2 FOLFIRI/Avastin 11/15/2019 ? Cycle 3 FOLFIRI/Avastin 11/29/2019 ? Cycle 4 FOLFIRI/Avastin 12/13/2019 ? CT chest 12/27/2019-no significant change in hepatic metastases, submassive pulmonary emboli ? Cycle 5 FOLFIRI 01/10/2020 (Avastin discontinued)  2. Deaf 3. Right epididymal cyst removal 03/22/2018 4. Asthma 5. Port-A-Cath placement, [name], 12/23/2018 6. Neutropenia secondary to chemotherapy-Udenyca added for cycle 2 FOLFOX 7. Admission with febrile neutropenia 02/08/2019 8. Hospital admission with submassive PEand left lower extremity DVTon 12/27/2019, heparin, transition to Xarelto 12/28/2019  CT chest 12/27/2019-submassive pulmonary emboli with evidence of right heart strain, no significant change in hepatic metastases  9.  01/24/2020 question perianal abscess/cyst-referred to  surgery    Disposition: [name] has completed 5 cycles of FOLFIRI.  Avastin has been discontinued.  He is scheduled  for cycle 6 today.  Over the past 24 hours he has developed severe perianal pain which he thought was due to a hemorrhoid.  He appears to have an abscess or cyst at the left perianal region.  We are contacting Dr. Cristal Generous office for an appointment.  We are holding chemotherapy.  He will return for lab, follow-up, chemotherapy in 1 week.  Patient seen with Dr. Benay Spice.    Ned Card ANP/GNP-BC   01/24/2020  11:03 AM  This was a shared visit with Ned Card.  Alexander Reeves was interviewed and examined.  Purulent material was expressed from a cystic lesion in the right perianal area.  He appears to have a perianal abscess.  He will be referred for surgical evaluation.  Chemotherapy was held today.  I examined Alexander Reeves and performed the medical decision making.  Julieanne Manson, MD

## 2020-01-24 NOTE — Progress Notes (Signed)
Provided one complimentary case of ensure plus. 

## 2020-01-24 NOTE — Progress Notes (Signed)
Patient requested to not be accessed today or have treatment due to hemroids. Spoke with NP and she instructed to just send him over to his appt.

## 2020-01-25 ENCOUNTER — Telehealth: Payer: Self-pay | Admitting: Nurse Practitioner

## 2020-01-25 NOTE — Telephone Encounter (Signed)
Scheduled appointments per 1/11 los. Spoke to patient who is aware of appointments dates and times.  

## 2020-01-29 ENCOUNTER — Other Ambulatory Visit: Payer: Self-pay | Admitting: Oncology

## 2020-01-31 ENCOUNTER — Inpatient Hospital Stay: Payer: Medicare Other

## 2020-01-31 ENCOUNTER — Telehealth: Payer: Self-pay

## 2020-01-31 ENCOUNTER — Inpatient Hospital Stay (HOSPITAL_BASED_OUTPATIENT_CLINIC_OR_DEPARTMENT_OTHER): Payer: Medicare Other | Admitting: Oncology

## 2020-01-31 ENCOUNTER — Other Ambulatory Visit: Payer: Self-pay

## 2020-01-31 VITALS — BP 109/77 | HR 107 | Temp 98.1°F | Resp 15 | Ht 70.0 in | Wt 150.5 lb

## 2020-01-31 DIAGNOSIS — Z5111 Encounter for antineoplastic chemotherapy: Secondary | ICD-10-CM | POA: Diagnosis not present

## 2020-01-31 DIAGNOSIS — Z95828 Presence of other vascular implants and grafts: Secondary | ICD-10-CM

## 2020-01-31 DIAGNOSIS — C182 Malignant neoplasm of ascending colon: Secondary | ICD-10-CM

## 2020-01-31 DIAGNOSIS — D701 Agranulocytosis secondary to cancer chemotherapy: Secondary | ICD-10-CM | POA: Diagnosis not present

## 2020-01-31 DIAGNOSIS — C787 Secondary malignant neoplasm of liver and intrahepatic bile duct: Secondary | ICD-10-CM | POA: Diagnosis not present

## 2020-01-31 DIAGNOSIS — I82402 Acute embolism and thrombosis of unspecified deep veins of left lower extremity: Secondary | ICD-10-CM | POA: Diagnosis not present

## 2020-01-31 DIAGNOSIS — K6289 Other specified diseases of anus and rectum: Secondary | ICD-10-CM | POA: Diagnosis not present

## 2020-01-31 LAB — CBC WITH DIFFERENTIAL (CANCER CENTER ONLY)
Abs Immature Granulocytes: 0.02 10*3/uL (ref 0.00–0.07)
Basophils Absolute: 0.1 10*3/uL (ref 0.0–0.1)
Basophils Relative: 1 %
Eosinophils Absolute: 0.2 10*3/uL (ref 0.0–0.5)
Eosinophils Relative: 4 %
HCT: 35.4 % — ABNORMAL LOW (ref 39.0–52.0)
Hemoglobin: 11 g/dL — ABNORMAL LOW (ref 13.0–17.0)
Immature Granulocytes: 0 %
Lymphocytes Relative: 28 %
Lymphs Abs: 1.3 10*3/uL (ref 0.7–4.0)
MCH: 26.4 pg (ref 26.0–34.0)
MCHC: 31.1 g/dL (ref 30.0–36.0)
MCV: 85.1 fL (ref 80.0–100.0)
Monocytes Absolute: 0.5 10*3/uL (ref 0.1–1.0)
Monocytes Relative: 12 %
Neutro Abs: 2.5 10*3/uL (ref 1.7–7.7)
Neutrophils Relative %: 55 %
Platelet Count: 288 10*3/uL (ref 150–400)
RBC: 4.16 MIL/uL — ABNORMAL LOW (ref 4.22–5.81)
RDW: 17.5 % — ABNORMAL HIGH (ref 11.5–15.5)
WBC Count: 4.5 10*3/uL (ref 4.0–10.5)
nRBC: 0 % (ref 0.0–0.2)

## 2020-01-31 LAB — CMP (CANCER CENTER ONLY)
ALT: 7 U/L (ref 0–44)
AST: 11 U/L — ABNORMAL LOW (ref 15–41)
Albumin: 3.6 g/dL (ref 3.5–5.0)
Alkaline Phosphatase: 47 U/L (ref 38–126)
Anion gap: 6 (ref 5–15)
BUN: 10 mg/dL (ref 6–20)
CO2: 25 mmol/L (ref 22–32)
Calcium: 9.2 mg/dL (ref 8.9–10.3)
Chloride: 107 mmol/L (ref 98–111)
Creatinine: 1.44 mg/dL — ABNORMAL HIGH (ref 0.61–1.24)
GFR, Estimated: 58 mL/min — ABNORMAL LOW (ref >60)
Glucose, Bld: 109 mg/dL — ABNORMAL HIGH (ref 70–99)
Potassium: 3.9 mmol/L (ref 3.5–5.1)
Sodium: 138 mmol/L (ref 135–145)
Total Bilirubin: <0.2 mg/dL — ABNORMAL LOW (ref 0.3–1.2)
Total Protein: 7.2 g/dL (ref 6.5–8.1)

## 2020-01-31 MED ORDER — SODIUM CHLORIDE 0.9% FLUSH
10.0000 mL | INTRAVENOUS | Status: DC | PRN
  Administered 2020-01-31: 10 mL
  Filled 2020-01-31: qty 10

## 2020-01-31 NOTE — Progress Notes (Signed)
[place] OFFICE PROGRESS NOTE   Diagnosis: Colon cancer  INTERVAL HISTORY:   Alexander Reeves returns as scheduled.  When we saw him on 01/24/2020 he appeared to have a perianal abscess.  He was seen at the surgical clinic and prescribed antibiotic.  He reports the drainage is much improved.  He did not have an incision procedure.  He has occasional discomfort in the left neck and shoulder area.  No other complaint.  He continues anticoagulation therapy.  Objective:  Vital signs in last 24 hours:  Blood pressure 109/77, pulse (!) 107, temperature 98.1 F (36.7 C), temperature source Tympanic, resp. rate 15, height _0  (1.778 m), weight 150 lb 8 oz (68.3 kg), SpO2 100 %.    Resp: Lungs clear bilaterally Cardio: Regular rate and rhythm GI: No hepatosplenomegaly, nontender Vascular: No leg edema Rectal: Small external hemorrhoids, 1 hemorrhoid versus cyst at the right anal verge has no fluctuance or drainage today.  No tenderness.    Portacath/PICC-without erythema  Lab Results:  Lab Results  Component Value Date   WBC 4.5 01/31/2020   HGB 11.0 (L) 01/31/2020   HCT 35.4 (L) 01/31/2020   MCV 85.1 01/31/2020   PLT 288 01/31/2020   NEUTROABS 2.5 01/31/2020    CMP  Lab Results  Component Value Date   NA 138 01/31/2020   K 3.9 01/31/2020   CL 107 01/31/2020   CO2 25 01/31/2020   GLUCOSE 109 (H) 01/31/2020   BUN 10 01/31/2020   CREATININE 1.44 (H) 01/31/2020   CALCIUM 9.2 01/31/2020   PROT 7.2 01/31/2020   ALBUMIN 3.6 01/31/2020   AST 11 (L) 01/31/2020   ALT 7 01/31/2020   ALKPHOS 47 01/31/2020   BILITOT <0.2 (L) 01/31/2020   GFRNONAA 58 (L) 01/31/2020   GFRAA >60 09/28/2019    Lab Results  Component Value Date   CEA1 1.75 09/28/2019     Medications: I have reviewed the patient's current medications.   Assessment/Plan: .Moderately differentiated adenocarcinoma ascending colon, stage IIIb (pT3pN1), status post a right colectomy  11/19/2018 ? Lymphovascular and perineural invasion present, 2/14 lymph nodes positive, tumor deposits present ? Positive radial margin, no loss of mismatch repair protein expression, discussed case with Dr. Craig Staggers mass with surrounding inflammation at multiple margins, no gross residual disease, unclear which is the "positive "radial margin, further surgery and radiation not recommended ? Colonoscopy 11/19/2018-completely obstructing mid ascending colon mass, could not be passed with endoscope, biopsy confirmed invasive adenocarcinoma ? CT abdomen/pelvis 11/18/2018-wall thickening at the mid and distal ascending colon with mild distention of the proximal ascending colon and cecum ? CTs 10/17/2018--no acute findings, no chest lymphadenopathy, lungs clear ? Cycle 1 FOLFOX 12/28/2018 ? Cycle 2 FOLFOX 01/11/2019, Udenyca added ? Cycle 3 FOLFOX 01/25/2019,Udenyca held due to bone pain ? Cycle 4 FOLFOX 02/21/2019, Udenyca added ? Cycle 5 FOLFOX 03/09/2019, Udenyca ? Cycle 6 FOLFOX 03/21/2019, Udenyca ? Cycle 7 FOLFOX 04/04/2019, Udenyca ? Cycle 8 FOLFOX 04/18/2019, Udenyca ? Cycle 9 FOLFOX 05/02/2019, Udenyca ? Cycle 10 FOLFOX 05/16/2019, oxaliplatin, 5-FU bolus, and Udenyca held ? Cycle 11 FOLFOX 05/30/2019, oxaliplatin, 5-FU bolus, and Udenyca held ? Cycle 12 FOLFOX 06/15/2019, oxaliplatin, 5-FU bolus and Udenyca held ? CTs 09/29/2019-new hypodense enhancing liver masses consistent with metastatic disease, indistinct marginated nodularity below the pancreas head-likely small lymph nodes ? Biopsy liver lesion 10/18/2019-adenocarcinoma consistent with history of colorectal carcinoma ? Cycle 1 FOLFIRI/Avastin 11/01/2019 ? Cycle 2 FOLFIRI/Avastin 11/15/2019 ? Cycle 3 FOLFIRI/Avastin 11/29/2019 ? Cycle 4  FOLFIRI/Avastin 12/13/2019 ? CT chest 12/27/2019-no significant change in hepatic metastases, submassive pulmonary emboli ? Cycle 5 FOLFIRI 01/10/2020 (Avastin discontinued) ? Cycle 6 FOLFIRI  02/01/2020  2. Deaf 3. Right epididymal cyst removal 03/22/2018 4. Asthma 5. Port-A-Cath placement, [name], 12/23/2018 6. Neutropenia secondary to chemotherapy-Udenyca added for cycle 2 FOLFOX 7. Admission with febrile neutropenia 02/08/2019 8. Hospital admission with submassive PEand left lower extremity DVTon 12/27/2019, heparin, transition to Xarelto 12/28/2019  CT chest 12/27/2019-submassive pulmonary emboli with evidence of right heart strain, no significant change in hepatic metastases  9.  01/24/2020 question perianal abscess/cyst-referred to surgery, treated with antibiotics-improved      Disposition: Mr. Delker appears well today.  The perianal infection has improved.  He will return tomorrow for the next cycle of FOLFIRI.  He will return for an office visit in the next cycle of chemotherapy in 2 weeks.  He will be scheduled for a restaging CT evaluation in 1-2 months.  Betsy Coder, MD  01/31/2020  2:18 PM

## 2020-01-31 NOTE — Telephone Encounter (Signed)
Called received from [facility]. They saw pt last week for an abcessed hemorrhoid. Their office was closed yesterday due to weather. They need pt to reschedule appointment if needed. If he is doing well then he can return as needed. Dr. Benay Spice notified and he will assess area.

## 2020-02-01 ENCOUNTER — Other Ambulatory Visit: Payer: Self-pay

## 2020-02-01 ENCOUNTER — Telehealth: Payer: Self-pay | Admitting: Oncology

## 2020-02-01 ENCOUNTER — Inpatient Hospital Stay: Payer: Medicare Other

## 2020-02-01 VITALS — BP 111/81 | HR 93 | Temp 97.8°F | Resp 16

## 2020-02-01 DIAGNOSIS — C182 Malignant neoplasm of ascending colon: Secondary | ICD-10-CM

## 2020-02-01 DIAGNOSIS — D701 Agranulocytosis secondary to cancer chemotherapy: Secondary | ICD-10-CM | POA: Diagnosis not present

## 2020-02-01 DIAGNOSIS — I82402 Acute embolism and thrombosis of unspecified deep veins of left lower extremity: Secondary | ICD-10-CM | POA: Diagnosis not present

## 2020-02-01 DIAGNOSIS — C787 Secondary malignant neoplasm of liver and intrahepatic bile duct: Secondary | ICD-10-CM | POA: Diagnosis not present

## 2020-02-01 DIAGNOSIS — Z79899 Other long term (current) drug therapy: Secondary | ICD-10-CM

## 2020-02-01 DIAGNOSIS — C189 Malignant neoplasm of colon, unspecified: Secondary | ICD-10-CM

## 2020-02-01 DIAGNOSIS — K6289 Other specified diseases of anus and rectum: Secondary | ICD-10-CM | POA: Diagnosis not present

## 2020-02-01 DIAGNOSIS — Z5111 Encounter for antineoplastic chemotherapy: Secondary | ICD-10-CM | POA: Diagnosis not present

## 2020-02-01 MED ORDER — SODIUM CHLORIDE 0.9 % IV SOLN
10.0000 mg | Freq: Once | INTRAVENOUS | Status: AC
  Administered 2020-02-01: 10 mg via INTRAVENOUS
  Filled 2020-02-01: qty 10

## 2020-02-01 MED ORDER — FAMOTIDINE IN NACL 20-0.9 MG/50ML-% IV SOLN
20.0000 mg | Freq: Once | INTRAVENOUS | Status: AC
  Administered 2020-02-01: 20 mg via INTRAVENOUS

## 2020-02-01 MED ORDER — SODIUM CHLORIDE 0.9 % IV SOLN
400.0000 mg/m2 | Freq: Once | INTRAVENOUS | Status: AC
  Administered 2020-02-01: 716 mg via INTRAVENOUS
  Filled 2020-02-01: qty 35.8

## 2020-02-01 MED ORDER — IRINOTECAN HCL CHEMO INJECTION 100 MG/5ML
180.0000 mg/m2 | Freq: Once | INTRAVENOUS | Status: AC
  Administered 2020-02-01: 320 mg via INTRAVENOUS
  Filled 2020-02-01: qty 15

## 2020-02-01 MED ORDER — SODIUM CHLORIDE 0.9 % IV SOLN
Freq: Once | INTRAVENOUS | Status: AC
  Filled 2020-02-01: qty 250

## 2020-02-01 MED ORDER — ATROPINE SULFATE 1 MG/ML IJ SOLN
0.5000 mg | Freq: Once | INTRAMUSCULAR | Status: AC | PRN
  Administered 2020-02-01: 0.5 mg via INTRAVENOUS

## 2020-02-01 MED ORDER — PALONOSETRON HCL INJECTION 0.25 MG/5ML
0.2500 mg | Freq: Once | INTRAVENOUS | Status: AC
  Administered 2020-02-01: 0.25 mg via INTRAVENOUS

## 2020-02-01 MED ORDER — FAMOTIDINE IN NACL 20-0.9 MG/50ML-% IV SOLN
INTRAVENOUS | Status: AC
  Filled 2020-02-01: qty 50

## 2020-02-01 MED ORDER — FLUOROURACIL CHEMO INJECTION 2.5 GM/50ML
400.0000 mg/m2 | Freq: Once | INTRAVENOUS | Status: AC
  Administered 2020-02-01: 700 mg via INTRAVENOUS
  Filled 2020-02-01: qty 14

## 2020-02-01 MED ORDER — ATROPINE SULFATE 1 MG/ML IJ SOLN
INTRAMUSCULAR | Status: AC
  Filled 2020-02-01: qty 1

## 2020-02-01 MED ORDER — PALONOSETRON HCL INJECTION 0.25 MG/5ML
INTRAVENOUS | Status: AC
  Filled 2020-02-01: qty 5

## 2020-02-01 MED ORDER — ATROPINE SULFATE 1 MG/ML IJ SOLN: 0.5000 mg | Freq: Once | INTRAMUSCULAR | Status: DC

## 2020-02-01 MED ORDER — SODIUM CHLORIDE 0.9 % IV SOLN
2400.0000 mg/m2 | INTRAVENOUS | Status: DC
  Administered 2020-02-01: 4300 mg via INTRAVENOUS
  Filled 2020-02-01: qty 86

## 2020-02-01 NOTE — Telephone Encounter (Signed)
Scheduled appointments per 1/18 los. Updated calendar printed for patient today per RN Erin in infusion.

## 2020-02-01 NOTE — Progress Notes (Signed)
Voicemail left for patient via specialty phone service for patient regarding updated appointment time for appointment on Friday.

## 2020-02-01 NOTE — Patient Instructions (Signed)
[facility] Discharge Instructions for Patients Receiving Chemotherapy  Today you received the following chemotherapy agents: Irinotecan, Leucovorin, and Fluorouracil   To help prevent nausea and vomiting after your treatment, we encourage you to take your nausea medication  as prescribed.    If you develop nausea and vomiting that is not controlled by your nausea medication, call the clinic.   BELOW ARE SYMPTOMS THAT SHOULD BE REPORTED IMMEDIATELY:  *FEVER GREATER THAN 100.5 F  *CHILLS WITH OR WITHOUT FEVER  NAUSEA AND VOMITING THAT IS NOT CONTROLLED WITH YOUR NAUSEA MEDICATION  *UNUSUAL SHORTNESS OF BREATH  *UNUSUAL BRUISING OR BLEEDING  TENDERNESS IN MOUTH AND THROAT WITH OR WITHOUT PRESENCE OF ULCERS  *URINARY PROBLEMS  *BOWEL PROBLEMS  UNUSUAL RASH Items with * indicate a potential emergency and should be followed up as soon as possible.  Feel free to call the clinic should you have any questions or concerns. The clinic phone number is [phone].  Please show the CHEMO ALERT CARD at check-in to the Emergency Department and triage nurse.   

## 2020-02-03 ENCOUNTER — Other Ambulatory Visit: Payer: Self-pay

## 2020-02-03 ENCOUNTER — Inpatient Hospital Stay: Payer: Medicare Other

## 2020-02-03 VITALS — BP 130/69 | HR 86 | Resp 18

## 2020-02-03 DIAGNOSIS — D701 Agranulocytosis secondary to cancer chemotherapy: Secondary | ICD-10-CM | POA: Diagnosis not present

## 2020-02-03 DIAGNOSIS — K6289 Other specified diseases of anus and rectum: Secondary | ICD-10-CM | POA: Diagnosis not present

## 2020-02-03 DIAGNOSIS — Z5111 Encounter for antineoplastic chemotherapy: Secondary | ICD-10-CM | POA: Diagnosis not present

## 2020-02-03 DIAGNOSIS — C182 Malignant neoplasm of ascending colon: Secondary | ICD-10-CM

## 2020-02-03 DIAGNOSIS — C189 Malignant neoplasm of colon, unspecified: Secondary | ICD-10-CM

## 2020-02-03 DIAGNOSIS — C787 Secondary malignant neoplasm of liver and intrahepatic bile duct: Secondary | ICD-10-CM | POA: Diagnosis not present

## 2020-02-03 DIAGNOSIS — I82402 Acute embolism and thrombosis of unspecified deep veins of left lower extremity: Secondary | ICD-10-CM | POA: Diagnosis not present

## 2020-02-03 MED ORDER — HEPARIN SOD (PORK) LOCK FLUSH 100 UNIT/ML IV SOLN
500.0000 [IU] | Freq: Once | INTRAVENOUS | Status: DC | PRN
  Filled 2020-02-03: qty 5

## 2020-02-03 MED ORDER — PEGFILGRASTIM-CBQV 6 MG/0.6ML ~~LOC~~ SOSY
6.0000 mg | PREFILLED_SYRINGE | Freq: Once | SUBCUTANEOUS | Status: AC
  Administered 2020-02-03: 6 mg via SUBCUTANEOUS

## 2020-02-03 MED ORDER — PEGFILGRASTIM-CBQV 6 MG/0.6ML ~~LOC~~ SOSY
PREFILLED_SYRINGE | SUBCUTANEOUS | Status: AC
  Filled 2020-02-03: qty 0.6

## 2020-02-03 MED ORDER — SODIUM CHLORIDE 0.9% FLUSH
10.0000 mL | INTRAVENOUS | Status: DC | PRN
  Filled 2020-02-03: qty 10

## 2020-02-03 NOTE — Patient Instructions (Signed)
Pegfilgrastim injection °What is this medicine? °PEGFILGRASTIM (PEG fil gra stim) is a long-acting granulocyte colony-stimulating factor that stimulates the growth of neutrophils, a type of white blood cell important in the body's fight against infection. It is used to reduce the incidence of fever and infection in patients with certain types of cancer who are receiving chemotherapy that affects the bone marrow, and to increase survival after being exposed to high doses of radiation. °This medicine may be used for other purposes; ask your health care provider or pharmacist if you have questions. °COMMON BRAND NAME(S): Fulphila, Neulasta, Nyvepria, UDENYCA, Ziextenzo °What should I tell my health care provider before I take this medicine? °They need to know if you have any of these conditions: °· kidney disease °· latex allergy °· ongoing radiation therapy °· sickle cell disease °· skin reactions to acrylic adhesives (On-Body Injector only) °· an unusual or allergic reaction to pegfilgrastim, filgrastim, other medicines, foods, dyes, or preservatives °· pregnant or trying to get pregnant °· breast-feeding °How should I use this medicine? °This medicine is for injection under the skin. If you get this medicine at home, you will be taught how to prepare and give the pre-filled syringe or how to use the On-body Injector. Refer to the patient Instructions for Use for detailed instructions. Use exactly as directed. Tell your healthcare provider immediately if you suspect that the On-body Injector may not have performed as intended or if you suspect the use of the On-body Injector resulted in a missed or partial dose. °It is important that you put your used needles and syringes in a special sharps container. Do not put them in a trash can. If you do not have a sharps container, call your pharmacist or healthcare provider to get one. °Talk to your pediatrician regarding the use of this medicine in children. While this drug  may be prescribed for selected conditions, precautions do apply. °Overdosage: If you think you have taken too much of this medicine contact a poison control center or emergency room at once. °NOTE: This medicine is only for you. Do not share this medicine with others. °What if I miss a dose? °It is important not to miss your dose. Call your doctor or health care professional if you miss your dose. If you miss a dose due to an On-body Injector failure or leakage, a new dose should be administered as soon as possible using a single prefilled syringe for manual use. °What may interact with this medicine? °Interactions have not been studied. °This list may not describe all possible interactions. Give your health care provider a list of all the medicines, herbs, non-prescription drugs, or dietary supplements you use. Also tell them if you smoke, drink alcohol, or use illegal drugs. Some items may interact with your medicine. °What should I watch for while using this medicine? °Your condition will be monitored carefully while you are receiving this medicine. °You may need blood work done while you are taking this medicine. °Talk to your health care provider about your risk of cancer. You may be more at risk for certain types of cancer if you take this medicine. °If you are going to need a MRI, CT scan, or other procedure, tell your doctor that you are using this medicine (On-Body Injector only). °What side effects may I notice from receiving this medicine? °Side effects that you should report to your doctor or health care professional as soon as possible: °· allergic reactions (skin rash, itching or hives, swelling of   the face, lips, or tongue) °· back pain °· dizziness °· fever °· pain, redness, or irritation at site where injected °· pinpoint red spots on the skin °· red or dark-brown urine °· shortness of breath or breathing problems °· stomach or side pain, or pain at the shoulder °· swelling °· tiredness °· trouble  passing urine or change in the amount of urine °· unusual bruising or bleeding °Side effects that usually do not require medical attention (report to your doctor or health care professional if they continue or are bothersome): °· bone pain °· muscle pain °This list may not describe all possible side effects. Call your doctor for medical advice about side effects. You may report side effects to FDA at 1-800-FDA-1088. °Where should I keep my medicine? °Keep out of the reach of children. °If you are using this medicine at home, you will be instructed on how to store it. Throw away any unused medicine after the expiration date on the label. °NOTE: This sheet is a summary. It may not cover all possible information. If you have questions about this medicine, talk to your doctor, pharmacist, or health care provider. °© 2021 [employer] (2019-01-21 13:20:51) ° °

## 2020-02-07 ENCOUNTER — Ambulatory Visit: Payer: Medicare Other

## 2020-02-07 ENCOUNTER — Other Ambulatory Visit: Payer: Medicare Other

## 2020-02-07 ENCOUNTER — Encounter: Payer: Medicare Other | Admitting: Nutrition

## 2020-02-07 ENCOUNTER — Ambulatory Visit: Payer: Medicare Other | Admitting: Oncology

## 2020-02-07 ENCOUNTER — Ambulatory Visit: Payer: Medicare Other | Admitting: Nurse Practitioner

## 2020-02-09 ENCOUNTER — Other Ambulatory Visit: Payer: Self-pay

## 2020-02-09 ENCOUNTER — Encounter (INDEPENDENT_AMBULATORY_CARE_PROVIDER_SITE_OTHER): Payer: Self-pay | Admitting: Primary Care

## 2020-02-09 ENCOUNTER — Ambulatory Visit (INDEPENDENT_AMBULATORY_CARE_PROVIDER_SITE_OTHER): Payer: Medicare Other | Admitting: Primary Care

## 2020-02-09 VITALS — BP 111/75 | HR 101 | Temp 97.3°F | Ht 70.0 in | Wt 147.8 lb

## 2020-02-09 DIAGNOSIS — H913 Deaf nonspeaking, not elsewhere classified: Secondary | ICD-10-CM

## 2020-02-09 DIAGNOSIS — I2699 Other pulmonary embolism without acute cor pulmonale: Secondary | ICD-10-CM | POA: Diagnosis not present

## 2020-02-09 DIAGNOSIS — Z09 Encounter for follow-up examination after completed treatment for conditions other than malignant neoplasm: Secondary | ICD-10-CM

## 2020-02-09 DIAGNOSIS — H9202 Otalgia, left ear: Secondary | ICD-10-CM | POA: Diagnosis not present

## 2020-02-09 DIAGNOSIS — I82492 Acute embolism and thrombosis of other specified deep vein of left lower extremity: Secondary | ICD-10-CM | POA: Diagnosis not present

## 2020-02-09 NOTE — Progress Notes (Signed)
Established Patient Office Visit  Subjective:  Patient ID: Alexander Cherubini., male    DOB: 1965/02/09  Age: 55 y.o. MRN: [id number]  CC:  Chief Complaint  Patient presents with  . Hospitalization Follow-up    PE/DVT    HPI Mr. Alexander STROLE Sr. presents for hospital follow up 55 y.o.male presents for follow up from the hospital. He went to  ED for shortness of breath.  Admit date to the hospital was 12/27/19, patient was discharged from the hospital on 12/30/19, patient was admitted for: left lower extremity edema and shortness of breath. Venous Doppler studies were positive for left popliteal, posterior tibial and peroneal vein DVT and CTA with submassive pulmonary embolism with evidence of right heart strain.  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 Today he is concern with left arm pain from elbow down intermittent start yesterday.Question if he sept wrong he thinks so. Just making sure he was not having a stroke. Discuss signs and symptoms of a stroke and heart attack when to call 911. Left ear feels clogged up.  Past Medical History:  Diagnosis Date  . Arthritis    KNEES  . Asthma   . Deaf   . GERD (gastroesophageal reflux disease)    OCC  . 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 Scrotal cyst   . Thyroid disease   . Urinary frequency    OCC    Past Surgical History:  Procedure Laterality Date  . BIOPSY  11/19/2018   Procedure: BIOPSY;  Surgeon: Thornton Park, MD;  Location: [facility];  Service: Gastroenterology;;  . BIOPSY  09/20/2019   Procedure: BIOPSY;  Surgeon: Thornton Park, MD;  Location: [facility];  Service: Gastroenterology;;  . BLADDER SURGERY  YRS AGO  . COLONOSCOPY WITH PROPOFOL N/A 11/19/2018   Procedure: COLONOSCOPY WITH PROPOFOL;  Surgeon: Thornton Park, MD;  Location: [facility];  Service: Gastroenterology;  Laterality: N/A;  . COLONOSCOPY WITH PROPOFOL N/A 09/20/2019   Procedure: COLONOSCOPY WITH PROPOFOL;  Surgeon: Thornton Park, MD;  Location: [facility];  Service: Gastroenterology;  Laterality: N/A;  . EPIDIDYMECTOMY Right 03/22/2018   Procedure: SCROTAL EXPLORATION RIGHT  PARTIAL EPIDIDYMECTOMY;  Surgeon: [name], MD;  Location: [facility];  Service: Urology;  Laterality: Right;  . HEMORRHOID SURGERY  WHEN YOUNG  . LAPAROTOMY N/A 11/19/2018   Procedure: EXPLORATORY LAPAROTOMY;  Surgeon: Rolm Bookbinder, MD;  Location: [facility];  Service: General;  Laterality: N/A;  . NO PAST SURGERIES    . PARTIAL COLECTOMY Right 11/19/2018   Procedure: PARTIAL COLECTOMY;  Surgeon: Rolm Bookbinder, MD;  Location: [facility];  Service: General;  Laterality: Right;  . POLYPECTOMY  09/20/2019   Procedure: POLYPECTOMY;  Surgeon: Thornton Park, MD;  Location: [facility];  Service: Gastroenterology;;  . [name] PLACEMENT Right 12/23/2018   Procedure: INSERTION PORT-A-CATH WITH ULTRASOUND GUIDANCE;  Surgeon: Rolm Bookbinder, MD;  Location: [facility];  Service: General;  Laterality: Right;  . SUBMUCOSAL TATTOO INJECTION  11/19/2018   Procedure: SUBMUCOSAL TATTOO INJECTION;  Surgeon: Thornton Park, MD;  Location: [facility];  Service: Gastroenterology;;  . [name]    Family History  Problem Relation Age of Onset  . Heart disease Father   . Colon cancer Neg Hx   . Esophageal cancer Neg Hx   . Rectal cancer Neg Hx   . Stomach cancer Neg Hx     Social History   Socioeconomic History  . Marital  status: Single    Spouse name: Not on file  . Number of children: Not on file  . Years of education: Not on file  . Highest education level: Not on file  Occupational History  . Not on file  Tobacco Use  . Smoking status: Former Smoker    Types: Cigarettes    Quit date: 06/24/2016    Years since quitting: 3.6  . Smokeless tobacco: Never Used  Vaping Use  . Vaping Use: Never used  Substance and Sexual Activity  . Alcohol use: Yes    Comment: OCC  . Drug use:  Yes    Types: Marijuana    Comment: weekends (last fri and sat)  . Sexual activity: Never    Birth control/protection: None  Other Topics Concern  . Not on file  Social History Narrative   [facility] (09/01/16):   No new pets. Moved from [place]. Questionable mold exposure at his previous residence and possibly some at his new apartment as well.    Social Determinants of Health   Financial Resource Strain: Not on file  Food Insecurity: Not on file  Transportation Needs: Not on file  Physical Activity: Not on file  Stress: Not on file  Social Connections: Not on file  Intimate Partner Violence: Not on file    Outpatient Medications Prior to Visit  Medication Sig Dispense Refill  . albuterol (PROVENTIL) (2.5 MG/3ML) 0.083% nebulizer solution Take 3 mLs (2.5 mg total) by nebulization every 6 (six) hours as needed for wheezing or shortness of breath. 75 mL 12  . albuterol (VENTOLIN HFA) 108 (90 Base) MCG/ACT inhaler Inhale 2 puffs into the lungs every 4 (four) hours as needed for wheezing or shortness of breath. 6.7 g 3  . ciclopirox (PENLAC) 8 % solution Apply topically at bedtime. Apply over nail and surrounding skin. Apply daily over previous coat. After seven (7) days, may remove with alcohol and continue cycle. 6.6 mL 2  . fenofibrate (TRICOR) 145 MG tablet TAKE 1 TABLET (145 MG TOTAL) BY MOUTH DAILY. 90 tablet 3  . fluticasone furoate-vilanterol (BREO ELLIPTA) 200-25 MCG/INH AEPB Inhale 1 puff into the lungs daily. (Patient taking differently: Inhale 1 puff into the lungs daily as needed (shortness of breath).) 60 each 11  . gabapentin (NEURONTIN) 300 MG capsule Take 1 capsule (300 mg total) by mouth at bedtime. 90 capsule 3  . ibuprofen (ADVIL) 600 MG tablet Take 1 tablet (600 mg total) by mouth every 8 (eight) hours as needed for moderate pain. 90 tablet 0  . lidocaine-prilocaine (EMLA) cream Apply 1 application topically as needed. To feet (Patient taking differently: Apply 1  application topically daily as needed (foot pain).) 30 g 2  . loratadine (CLARITIN) 10 MG tablet Take 10 mg by mouth daily as needed for allergies.    . montelukast (SINGULAIR) 10 MG tablet Take 1 tablet (10 mg total) by mouth at bedtime. 90 tablet 1  . Naphazoline HCl (CLEAR EYES OP) Place 1 drop into both eyes daily as needed (redness/dryness).    . pantoprazole (PROTONIX) 40 MG tablet Take 1 tablet (40 mg total) by mouth daily. 30 tablet 3  . rivaroxaban (XARELTO) 20 MG TABS tablet Take 1 tablet (20 mg total) by mouth daily with supper. 30 tablet 3  . methocarbamol (ROBAXIN) 500 MG tablet Take 1 tablet (500 mg total) by mouth every 8 (eight) hours as needed for muscle spasms. 90 tablet 0   No facility-administered medications prior to visit.  Allergies  Allergen Reactions  . Cheese Shortness Of Breath and Swelling    Swelling of the throat  . Eggs Or Egg-Derived Products Shortness Of Breath and Swelling    Swelling of the throat  . Penicillins Anaphylaxis    Has patient had a PCN reaction causing immediate rash, facial/tongue/throat swelling, SOB or lightheadedness with hypotension: Yes Has patient had a PCN reaction causing severe rash involving mucus membranes or skin necrosis: Unknown Has patient had a PCN reaction that required hospitalization: Unknown Has patient had a PCN reaction occurring within the last 10 years: Unknown If all of the above answers are "NO", then may proceed with Cephalosporin use.   . Gemfibrozil Nausea Only    ROS Review of Systems  HENT: Positive for hearing loss.        Deaf Feels right ear is clogged   Respiratory: Positive for shortness of breath.        With exertion and cleaning and dusting   Musculoskeletal: Positive for back pain.       Only when cleaning or bending too much   Neurological: Positive for speech difficulty.       Sign language deaf  All other systems reviewed and are negative.     Objective:    Physical Exam Vitals  reviewed.  Constitutional:      Appearance: Normal appearance.  HENT:     Nose: Nose normal.  Eyes:     Extraocular Movements: Extraocular movements intact.     Pupils: Pupils are equal, round, and reactive to light.  Cardiovascular:     Rate and Rhythm: Normal rate and regular rhythm.  Pulmonary:     Effort: Pulmonary effort is normal.     Breath sounds: Normal breath sounds.  Abdominal:     General: Bowel sounds are normal.     Palpations: Abdomen is soft.  Musculoskeletal:        General: Normal range of motion.     Cervical back: Normal range of motion.  Skin:    General: Skin is warm and dry.  Neurological:     Mental Status: He is alert and oriented to person, place, and time.  Psychiatric:        Mood and Affect: Mood normal.        Behavior: Behavior normal.        Thought Content: Thought content normal.        Judgment: Judgment normal.     BP 111/75 (BP Location: Right Arm, Patient Position: Sitting, Cuff Size: Normal)   Pulse (!) 101   Temp (!) 97.3 F (36.3 C) (Temporal)   Ht '5\' 10"'  (1.778 m)   Wt 147 lb 12.8 oz (67 kg)   SpO2 100%   BMI 21.21 kg/m  Wt Readings from Last 3 Encounters:  02/09/20 147 lb 12.8 oz (67 kg)  01/31/20 150 lb 8 oz (68.3 kg)  01/24/20 144 lb (65.3 kg)     Health Maintenance Due  Topic Date Due  . COVID-19 Vaccine (1) Never done    There are no preventive care reminders to display for this patient.  Lab Results  Component Value Date   TSH 1.838 12/28/2019   Lab Results  Component Value Date   WBC 4.5 01/31/2020   HGB 11.0 (L) 01/31/2020   HCT 35.4 (L) 01/31/2020   MCV 85.1 01/31/2020   PLT 288 01/31/2020   Lab Results  Component Value Date   NA 138 01/31/2020   K 3.9 01/31/2020  CO2 25 01/31/2020   GLUCOSE 109 (H) 01/31/2020   BUN 10 01/31/2020   CREATININE 1.44 (H) 01/31/2020   BILITOT <0.2 (L) 01/31/2020   ALKPHOS 47 01/31/2020   AST 11 (L) 01/31/2020   ALT 7 01/31/2020   PROT 7.2 01/31/2020    ALBUMIN 3.6 01/31/2020   CALCIUM 9.2 01/31/2020   ANIONGAP 6 01/31/2020   Lab Results  Component Value Date   CHOL 189 08/25/2019   Lab Results  Component Value Date   HDL 63 08/25/2019   Lab Results  Component Value Date   LDLCALC 97 08/25/2019   Lab Results  Component Value Date   TRIG 168 (H) 08/25/2019   Lab Results  Component Value Date   CHOLHDL 3.0 08/25/2019   No results found for: HGBA1C    Assessment & Plan:  Alexander Reeves was seen today for hospitalization follow-up.  Diagnoses and all orders for this visit:  Nonspeaking deaf Sign language ASL interpretor present  Hospital discharge follow-up No new orders or labs request only   Follow-Ups: Schedule an appointment with [name], NP (Internal Medicine   Acute deep vein thrombosis (DVT) of other specified vein of left lower extremity ([place]) Explanation of causes of DVT and s/s - which can break off and travel had information but did not understand until ASL made it understandable  Patient will continue with Xarelto for at least 3 to 31-monthand his oncologist can decide about the duration of Xarelto.  Other acute pulmonary embolism, unspecified whether acute cor pulmonale present ([facility] Patient will continue with Xarelto for at least 3 to 663-monthnd his oncologist can decide about the duration of Xarelto.  Left ear pain Soft wax in left ear with a small red spot entering the canal and no wax in right - he admits to having a ear wax remover that he uses every other day . Explain wax is good and irrigation is too often to stop. If feel ears are stopped up with was call office to evaluate.   Follow-up: Return if symptoms worsen or fail to improve.    MiKerin PernaNP

## 2020-02-09 NOTE — Patient Instructions (Signed)
Deep Vein Thrombosis  Deep vein thrombosis (DVT) is a condition in which a blood clot forms in a deep vein, such as a vein in the lower leg, thigh, pelvis, or arm. Deep veins are veins in the deep venous system. A clot is blood that has thickened into a gel or solid. This condition is serious and can be life-threatening if the clot travels to the lungs and causes a blockage (pulmonary embolism) in the arteries of the lung. A DVT can also damage veins in the leg. This can lead to long-term, or chronic, venous disease, leg pain, swelling, discoloration, and ulcers or sores (post-thrombotic syndrome). What are the causes? This condition may be caused by:  A slowdown of blood flow.  Damage to a vein.  A condition that causes blood to clot more easily, such as certain blood-clotting disorders. What increases the risk? The following factors may make you more likely to develop this condition:  Having obesity.  Being older, especially older than age 60.  Being inactive (sedentary lifestyle) or not moving around. This may include: ? Sitting or lying down for longer than 4-6 hours other than to sleep at night. ? Being in the hospital, having major or lengthy surgery, or having a thin, flexible tube (central line catheter) placed in a large vein.  Being pregnant, giving birth, or having recently given birth.  Taking medicines that contain estrogen, such as birth control or hormone replacement therapy.  Using products that contain nicotine or tobacco, especially if you use hormonal birth control.  Having a history of blood clots or a blood-clotting disease, a blood vessel disease (peripheral vascular disease), or congestive heart disease.  Having a history of cancer, especially if being treated with chemotherapy. What are the signs or symptoms? Symptoms of this condition include:  Swelling, pain, pressure, or tenderness in an arm or a leg.  An arm or a leg becoming warm, red, or  discolored.  A leg turning very pale. You may have a large DVT. This is rare. If the clot is in your leg, you may notice symptoms more or have worse symptoms when you stand or walk. In some cases, there are no symptoms. How is this diagnosed? This condition is diagnosed with:  Your medical history and a physical exam.  Tests, such as: ? Blood tests to check how well your blood clots. ? Doppler ultrasound. This is the best way to find a DVT. ? Venogram. Contrast dye is injected into a vein, and X-rays are taken to check for clots. How is this treated? Treatment for this condition depends on:  The cause of your DVT.  The size and location of your DVT, or having more than one DVT.  Your risk for bleeding or developing more clots.  Other medical conditions you may have. Treatment may include:  Taking a blood thinner, also called an anticoagulant, to prevent clots from forming and growing.  Wearing compression stockings, if directed.  Injecting medicines into the affected vein to break up the clot (catheter-directed thrombolysis). This is used only for severe DVT and only if a specialist recommends it.  Specific surgical procedures, when DVT is severe or hard to treat. These may be done to: ? Isolate and remove your clot. ? Place an inferior vena cava (IVC) filter in a large vein to catch blood clots before they reach your lungs. You may get some medical treatments for 6 months or longer. Follow these instructions at home: If you are taking blood thinners:    Talk with your health care provider before you take any medicines that contain aspirin or NSAIDs, such as ibuprofen. These medicines increase your risk for dangerous bleeding.  Take your medicine exactly as told, at the same time every day. Do not skip a dose. Do not take more than the prescribed dose. This is important.  Ask your health care provider about foods and medicines that could change the way your blood thinner  works (may interact). Avoid these foods and medicines if you are told to do so.  Avoid anything that may cause bleeding or bruising. You may bleed more easily while taking blood thinners. ? Be very careful when using knives, scissors, or other sharp objects. ? Use an electric razor instead of a blade. ? Avoid activities that could cause injury or bruising, and follow instructions for preventing falls. ? Tell your health care provider if you have had any internal bleeding, bleeding ulcers, or neurologic diseases, such as strokes or cerebral aneurysms.  Wear a medical alert bracelet or carry a card that lists what medicines you take. General instructions  Take over-the-counter and prescription medicines only as told by your health care provider.  Return to your normal activities as told by your health care provider. Ask your health care provider what activities are safe for you.  If recommended, wear compression stockings as told by your health care provider. These stockings help to prevent blood clots and reduce swelling in your legs.  Keep all follow-up visits as told by your health care provider. This is important. Contact a health care provider if:  You miss a dose of your blood thinner.  You have new or worse pain, swelling, or redness in an arm or a leg.  You have worsening numbness or tingling in an arm or a leg.  You have unusual bruising. Get help right away if:  You have signs or symptoms that a blood clot has moved to the lungs. These may include: ? Shortness of breath. ? Chest pain. ? Fast or irregular heartbeats (palpitations). ? Light-headedness or dizziness. ? Coughing up blood.  You have signs or symptoms that your blood is too thin. These may include: ? Blood in your vomit, stool, or urine. ? A cut that will not stop bleeding. ? A menstrual period that is heavier than usual. ? A severe headache or confusion. These symptoms may represent a serious problem that  is an emergency. Do not wait to see if the symptoms will go away. Get medical help right away. Call your local emergency services (911 in the [place] Do not drive yourself to the hospital. Summary  Deep vein thrombosis (DVT) happens when a blood clot forms in a deep vein. This may occur in the lower leg, thigh, pelvis, or arm.  Symptoms affect the arm or leg and can include swelling, pain, tenderness, warmth, redness, or discoloration.  This condition may be treated with medicines or compression stockings. In severe cases, surgery may be done.  If you are taking blood thinners, take them exactly as told. Do not skip a dose. Do not take more than is prescribed.  Get help right away if you have shortness of breath, chest pain, fast or irregular heartbeats, or blood in your vomit, urine, or stool. This information is not intended to replace advice given to you by your health care provider. Make sure you discuss any questions you have with your health care provider. Document Revised: 12/25/2018 Document Reviewed: 12/25/2018 Elsevier Patient Education  2021 Elsevier   Inc.  

## 2020-02-09 NOTE — Progress Notes (Signed)
Some pain in left arm that comes and goes not sure if it was due to clot

## 2020-02-13 ENCOUNTER — Inpatient Hospital Stay (HOSPITAL_BASED_OUTPATIENT_CLINIC_OR_DEPARTMENT_OTHER): Payer: Medicare Other | Admitting: Nurse Practitioner

## 2020-02-13 ENCOUNTER — Inpatient Hospital Stay: Payer: Medicare Other

## 2020-02-13 ENCOUNTER — Encounter: Payer: Self-pay | Admitting: Nurse Practitioner

## 2020-02-13 ENCOUNTER — Inpatient Hospital Stay: Payer: Medicare Other | Admitting: Nutrition

## 2020-02-13 ENCOUNTER — Other Ambulatory Visit: Payer: Self-pay

## 2020-02-13 VITALS — BP 120/81 | HR 90 | Temp 97.4°F | Resp 18 | Ht 70.0 in | Wt 149.4 lb

## 2020-02-13 DIAGNOSIS — C787 Secondary malignant neoplasm of liver and intrahepatic bile duct: Secondary | ICD-10-CM | POA: Diagnosis not present

## 2020-02-13 DIAGNOSIS — C182 Malignant neoplasm of ascending colon: Secondary | ICD-10-CM

## 2020-02-13 DIAGNOSIS — Z95828 Presence of other vascular implants and grafts: Secondary | ICD-10-CM

## 2020-02-13 DIAGNOSIS — I82402 Acute embolism and thrombosis of unspecified deep veins of left lower extremity: Secondary | ICD-10-CM | POA: Diagnosis not present

## 2020-02-13 DIAGNOSIS — C189 Malignant neoplasm of colon, unspecified: Secondary | ICD-10-CM

## 2020-02-13 DIAGNOSIS — D701 Agranulocytosis secondary to cancer chemotherapy: Secondary | ICD-10-CM | POA: Diagnosis not present

## 2020-02-13 DIAGNOSIS — Z5111 Encounter for antineoplastic chemotherapy: Secondary | ICD-10-CM | POA: Diagnosis not present

## 2020-02-13 DIAGNOSIS — K6289 Other specified diseases of anus and rectum: Secondary | ICD-10-CM | POA: Diagnosis not present

## 2020-02-13 LAB — CBC WITH DIFFERENTIAL (CANCER CENTER ONLY)
Abs Immature Granulocytes: 0.02 10*3/uL (ref 0.00–0.07)
Basophils Absolute: 0 10*3/uL (ref 0.0–0.1)
Basophils Relative: 1 %
Eosinophils Absolute: 0.3 10*3/uL (ref 0.0–0.5)
Eosinophils Relative: 6 %
HCT: 36.5 % — ABNORMAL LOW (ref 39.0–52.0)
Hemoglobin: 11.2 g/dL — ABNORMAL LOW (ref 13.0–17.0)
Immature Granulocytes: 0 %
Lymphocytes Relative: 35 %
Lymphs Abs: 1.6 10*3/uL (ref 0.7–4.0)
MCH: 25.9 pg — ABNORMAL LOW (ref 26.0–34.0)
MCHC: 30.7 g/dL (ref 30.0–36.0)
MCV: 84.5 fL (ref 80.0–100.0)
Monocytes Absolute: 0.5 10*3/uL (ref 0.1–1.0)
Monocytes Relative: 11 %
Neutro Abs: 2.2 10*3/uL (ref 1.7–7.7)
Neutrophils Relative %: 47 %
Platelet Count: 236 10*3/uL (ref 150–400)
RBC: 4.32 MIL/uL (ref 4.22–5.81)
RDW: 17.7 % — ABNORMAL HIGH (ref 11.5–15.5)
WBC Count: 4.6 10*3/uL (ref 4.0–10.5)
nRBC: 0 % (ref 0.0–0.2)

## 2020-02-13 LAB — CMP (CANCER CENTER ONLY)
ALT: 10 U/L (ref 0–44)
AST: 15 U/L (ref 15–41)
Albumin: 4.1 g/dL (ref 3.5–5.0)
Alkaline Phosphatase: 64 U/L (ref 38–126)
Anion gap: 6 (ref 5–15)
BUN: 12 mg/dL (ref 6–20)
CO2: 27 mmol/L (ref 22–32)
Calcium: 9.2 mg/dL (ref 8.9–10.3)
Chloride: 109 mmol/L (ref 98–111)
Creatinine: 1.29 mg/dL — ABNORMAL HIGH (ref 0.61–1.24)
GFR, Estimated: >60 mL/min (ref >60)
Glucose, Bld: 87 mg/dL (ref 70–99)
Potassium: 4.5 mmol/L (ref 3.5–5.1)
Sodium: 142 mmol/L (ref 135–145)
Total Bilirubin: 0.3 mg/dL (ref 0.3–1.2)
Total Protein: 7.4 g/dL (ref 6.5–8.1)

## 2020-02-13 MED ORDER — PALONOSETRON HCL INJECTION 0.25 MG/5ML
INTRAVENOUS | Status: AC
  Filled 2020-02-13: qty 5

## 2020-02-13 MED ORDER — SODIUM CHLORIDE 0.9% FLUSH
10.0000 mL | INTRAVENOUS | Status: DC | PRN
  Administered 2020-02-13: 10 mL
  Filled 2020-02-13: qty 10

## 2020-02-13 MED ORDER — SODIUM CHLORIDE 0.9 % IV SOLN
10.0000 mg | Freq: Once | INTRAVENOUS | Status: AC
  Administered 2020-02-13: 10 mg via INTRAVENOUS
  Filled 2020-02-13: qty 10

## 2020-02-13 MED ORDER — ATROPINE SULFATE 1 MG/ML IJ SOLN
0.5000 mg | Freq: Once | INTRAMUSCULAR | Status: AC
  Administered 2020-02-13: 0.5 mg via INTRAVENOUS

## 2020-02-13 MED ORDER — SODIUM CHLORIDE 0.9 % IV SOLN
400.0000 mg/m2 | Freq: Once | INTRAVENOUS | Status: AC
  Administered 2020-02-13: 716 mg via INTRAVENOUS
  Filled 2020-02-13: qty 35.8

## 2020-02-13 MED ORDER — PALONOSETRON HCL INJECTION 0.25 MG/5ML
0.2500 mg | Freq: Once | INTRAVENOUS | Status: AC
  Administered 2020-02-13: 0.25 mg via INTRAVENOUS

## 2020-02-13 MED ORDER — SODIUM CHLORIDE 0.9 % IV SOLN
2400.0000 mg/m2 | INTRAVENOUS | Status: DC
  Administered 2020-02-13: 4300 mg via INTRAVENOUS
  Filled 2020-02-13: qty 86

## 2020-02-13 MED ORDER — ATROPINE SULFATE 1 MG/ML IJ SOLN
INTRAMUSCULAR | Status: AC
  Filled 2020-02-13: qty 1

## 2020-02-13 MED ORDER — FLUOROURACIL CHEMO INJECTION 2.5 GM/50ML
400.0000 mg/m2 | Freq: Once | INTRAVENOUS | Status: AC
  Administered 2020-02-13: 700 mg via INTRAVENOUS
  Filled 2020-02-13: qty 14

## 2020-02-13 MED ORDER — SODIUM CHLORIDE 0.9 % IV SOLN
Freq: Once | INTRAVENOUS | Status: AC
  Filled 2020-02-13: qty 250

## 2020-02-13 MED ORDER — SODIUM CHLORIDE 0.9 % IV SOLN
180.0000 mg/m2 | Freq: Once | INTRAVENOUS | Status: AC
  Administered 2020-02-13: 320 mg via INTRAVENOUS
  Filled 2020-02-13: qty 15

## 2020-02-13 NOTE — Progress Notes (Signed)
Nutrition follow-up completed with patient during infusion for colon cancer. Follow-up was completed with patient using the assistance of a sign language interpreter. Patient reports he has is good appetite. Reports no nausea, vomiting, constipation.  Reports occasional diarrhea but it is not a problem. Noted weight improved to 149.4 pounds on January 31 from 144 pounds January 11. Patient requesting additional Ensure Plus.  He has been drinking 2 cartons daily.  Nutrition diagnosis: Unintended weight loss improved.  Intervention: Encourage patient to continue strategies for adequate calorie protein intake. Continue Ensure Plus or equivalent twice daily. Provided additional case of Ensure Plus. Encourage patient to contact me with questions or concerns.  Monitoring, evaluation, goals: Patient will tolerate adequate calories and protein to promote weight stabilization.  Next visit: No follow-up is scheduled.  We will follow as needed.  **Disclaimer: This note was dictated with voice recognition software. Similar sounding words can inadvertently be transcribed and this note may contain transcription errors which may not have been corrected upon publication of note.**

## 2020-02-13 NOTE — Patient Instructions (Signed)
[facility] Discharge Instructions for Patients Receiving Chemotherapy  Today you received the following chemotherapy agents: Irinotecan, leucovorin, 5FU  To help prevent nausea and vomiting after your treatment, we encourage you to take your nausea medication as directed.   If you develop nausea and vomiting that is not controlled by your nausea medication, call the clinic.   BELOW ARE SYMPTOMS THAT SHOULD BE REPORTED IMMEDIATELY:  *FEVER GREATER THAN 100.5 F  *CHILLS WITH OR WITHOUT FEVER  NAUSEA AND VOMITING THAT IS NOT CONTROLLED WITH YOUR NAUSEA MEDICATION  *UNUSUAL SHORTNESS OF BREATH  *UNUSUAL BRUISING OR BLEEDING  TENDERNESS IN MOUTH AND THROAT WITH OR WITHOUT PRESENCE OF ULCERS  *URINARY PROBLEMS  *BOWEL PROBLEMS  UNUSUAL RASH Items with * indicate a potential emergency and should be followed up as soon as possible.  Feel free to call the clinic should you have any questions or concerns. The clinic phone number is [phone].  Please show the CHEMO ALERT CARD at check-in to the Emergency Department and triage nurse.   

## 2020-02-13 NOTE — Progress Notes (Signed)
[place] OFFICE PROGRESS NOTE   Diagnosis: Colon cancer  INTERVAL HISTORY:   Alexander Reeves returns as scheduled.  He completed cycle 6 FOLFIRI 02/01/2020.  He denies nausea/vomiting.  No mouth sores.  No significant diarrhea.  No rectal bleeding.  He has a good appetite.  Objective:  Vital signs in last 24 hours:  Blood pressure 120/81, pulse 90, temperature (!) 97.4 F (36.3 C), temperature source Tympanic, resp. rate 18, height '5\' 10"'  (1.778 m), weight 149 lb 6.4 oz (67.8 kg), SpO2 97 %.    HEENT: No thrush or ulcers. Resp: Lungs clear bilaterally. Cardio: Regular rate and rhythm. GI: Abdomen soft and nontender.  No hepatomegaly. Vascular: No leg edema.  Skin: Palms with hyperpigmentation. Port-A-Cath without erythema.   Lab Results:  Lab Results  Component Value Date   WBC 4.6 02/13/2020   HGB 11.2 (L) 02/13/2020   HCT 36.5 (L) 02/13/2020   MCV 84.5 02/13/2020   PLT 236 02/13/2020   NEUTROABS 2.2 02/13/2020    Imaging:  No results found.  Medications: I have reviewed the patient's current medications.  Assessment/Plan: .Moderately differentiated adenocarcinoma ascending colon, stage IIIb (pT3pN1), status post a right colectomy 11/19/2018 ? Lymphovascular and perineural invasion present, 2/14 lymph nodes positive, tumor deposits present ? Positive radial margin, no loss of mismatch repair protein expression, discussed case with Dr. Craig Staggers mass with surrounding inflammation at multiple margins, no gross residual disease, unclear which is the "positive "radial margin, further surgery and radiation not recommended ? Colonoscopy 11/19/2018-completely obstructing mid ascending colon mass, could not be passed with endoscope, biopsy confirmed invasive adenocarcinoma ? CT abdomen/pelvis 11/18/2018-wall thickening at the mid and distal ascending colon with mild distention of the proximal ascending colon and cecum ? CTs 10/17/2018--no acute findings,  no chest lymphadenopathy, lungs clear ? Cycle 1 FOLFOX 12/28/2018 ? Cycle 2 FOLFOX 01/11/2019, Udenyca added ? Cycle 3 FOLFOX 01/25/2019,Udenyca held due to bone pain ? Cycle 4 FOLFOX 02/21/2019, Udenyca added ? Cycle 5 FOLFOX 03/09/2019, Udenyca ? Cycle 6 FOLFOX 03/21/2019, Udenyca ? Cycle 7 FOLFOX 04/04/2019, Udenyca ? Cycle 8 FOLFOX 04/18/2019, Udenyca ? Cycle 9 FOLFOX 05/02/2019, Udenyca ? Cycle 10 FOLFOX 05/16/2019, oxaliplatin, 5-FU bolus, and Udenyca held ? Cycle 11 FOLFOX 05/30/2019, oxaliplatin, 5-FU bolus, and Udenyca held ? Cycle 12 FOLFOX 06/15/2019, oxaliplatin, 5-FU bolus and Udenyca held ? CTs 09/29/2019-new hypodense enhancing liver masses consistent with metastatic disease, indistinct marginated nodularity below the pancreas head-likely small lymph nodes ? Biopsy liver lesion 10/18/2019-adenocarcinoma consistent with history of colorectal carcinoma ? Cycle 1 FOLFIRI/Avastin 11/01/2019 ? Cycle 2 FOLFIRI/Avastin 11/15/2019 ? Cycle 3 FOLFIRI/Avastin 11/29/2019 ? Cycle 4 FOLFIRI/Avastin 12/13/2019 ? CT chest 12/27/2019-no significant change in hepatic metastases, submassive pulmonary emboli ? Cycle 5 FOLFIRI 01/10/2020 (Avastin discontinued) ? Cycle 6 FOLFIRI 02/01/2020 ? Cycle 7 FOLFIRI 02/13/2020  2. Deaf 3. Right epididymal cyst removal 03/22/2018 4. Asthma 5. Port-A-Cath placement, [name], 12/23/2018 6. Neutropenia secondary to chemotherapy-Udenyca added for cycle 2 FOLFOX 7. Admission with febrile neutropenia 02/08/2019 8. Hospital admission with submassive PEand left lower extremity DVTon 12/27/2019, heparin, transition to Xarelto 12/28/2019  CT chest 12/27/2019-submassive pulmonary emboli with evidence of right heart strain, no significant change in hepatic metastases  9.  01/24/2020 question perianal abscess/cyst-referred to surgery, treated with antibiotics-improved    Disposition: Mr. [name] appears stable.  He has completed 6 cycles of FOLFIRI.  Plan to proceed  with cycle 7 today as scheduled.  We reviewed the CBC and chemistry panel from today.  Labs adequate to proceed with treatment.  He will return for lab, follow-up, cycle 8 FOLFIRI in 2 weeks.  He will contact the office in the interim with any problems.    Ned Card ANP/GNP-BC   02/13/2020  12:35 PM

## 2020-02-15 ENCOUNTER — Other Ambulatory Visit: Payer: Self-pay

## 2020-02-15 ENCOUNTER — Inpatient Hospital Stay: Payer: Medicare Other | Attending: Nurse Practitioner

## 2020-02-15 ENCOUNTER — Telehealth: Payer: Self-pay | Admitting: Nurse Practitioner

## 2020-02-15 VITALS — BP 122/79 | Temp 98.1°F | Resp 18

## 2020-02-15 DIAGNOSIS — C787 Secondary malignant neoplasm of liver and intrahepatic bile duct: Secondary | ICD-10-CM | POA: Insufficient documentation

## 2020-02-15 DIAGNOSIS — Z5111 Encounter for antineoplastic chemotherapy: Secondary | ICD-10-CM | POA: Diagnosis not present

## 2020-02-15 DIAGNOSIS — C182 Malignant neoplasm of ascending colon: Secondary | ICD-10-CM | POA: Diagnosis not present

## 2020-02-15 DIAGNOSIS — C779 Secondary and unspecified malignant neoplasm of lymph node, unspecified: Secondary | ICD-10-CM | POA: Diagnosis not present

## 2020-02-15 DIAGNOSIS — C189 Malignant neoplasm of colon, unspecified: Secondary | ICD-10-CM

## 2020-02-15 DIAGNOSIS — D701 Agranulocytosis secondary to cancer chemotherapy: Secondary | ICD-10-CM | POA: Insufficient documentation

## 2020-02-15 MED ORDER — HEPARIN SOD (PORK) LOCK FLUSH 100 UNIT/ML IV SOLN
500.0000 [IU] | Freq: Once | INTRAVENOUS | Status: AC | PRN
  Administered 2020-02-15: 500 [IU]
  Filled 2020-02-15: qty 5

## 2020-02-15 MED ORDER — SODIUM CHLORIDE 0.9% FLUSH
10.0000 mL | INTRAVENOUS | Status: DC | PRN
  Administered 2020-02-15: 10 mL
  Filled 2020-02-15: qty 10

## 2020-02-15 MED ORDER — PEGFILGRASTIM-CBQV 6 MG/0.6ML ~~LOC~~ SOSY
PREFILLED_SYRINGE | SUBCUTANEOUS | Status: AC
  Filled 2020-02-15: qty 0.6

## 2020-02-15 MED ORDER — PEGFILGRASTIM-CBQV 6 MG/0.6ML ~~LOC~~ SOSY
6.0000 mg | PREFILLED_SYRINGE | Freq: Once | SUBCUTANEOUS | Status: AC
Start: 2020-02-15 — End: 2020-02-15
  Administered 2020-02-15: 6 mg via SUBCUTANEOUS

## 2020-02-15 NOTE — Patient Instructions (Signed)
Pegfilgrastim injection What is this medicine? PEGFILGRASTIM (PEG fil gra stim) is a long-acting granulocyte colony-stimulating factor that stimulates the growth of neutrophils, a type of white blood cell important in the body's fight against infection. It is used to reduce the incidence of fever and infection in patients with certain types of cancer who are receiving chemotherapy that affects the bone marrow, and to increase survival after being exposed to high doses of radiation. This medicine may be used for other purposes; ask your health care provider or pharmacist if you have questions. COMMON BRAND NAME(S): Fulphila, Neulasta, Nyvepria, UDENYCA, Ziextenzo What should I tell my health care provider before I take this medicine? They need to know if you have any of these conditions:  kidney disease  latex allergy  ongoing radiation therapy  sickle cell disease  skin reactions to acrylic adhesives (On-Body Injector only)  an unusual or allergic reaction to pegfilgrastim, filgrastim, other medicines, foods, dyes, or preservatives  pregnant or trying to get pregnant  breast-feeding How should I use this medicine? This medicine is for injection under the skin. If you get this medicine at home, you will be taught how to prepare and give the pre-filled syringe or how to use the On-body Injector. Refer to the patient Instructions for Use for detailed instructions. Use exactly as directed. Tell your healthcare provider immediately if you suspect that the On-body Injector may not have performed as intended or if you suspect the use of the On-body Injector resulted in a missed or partial dose. It is important that you put your used needles and syringes in a special sharps container. Do not put them in a trash can. If you do not have a sharps container, call your pharmacist or healthcare provider to get one. Talk to your pediatrician regarding the use of this medicine in children. While this drug  may be prescribed for selected conditions, precautions do apply. Overdosage: If you think you have taken too much of this medicine contact a poison control center or emergency room at once. NOTE: This medicine is only for you. Do not share this medicine with others. What if I miss a dose? It is important not to miss your dose. Call your doctor or health care professional if you miss your dose. If you miss a dose due to an On-body Injector failure or leakage, a new dose should be administered as soon as possible using a single prefilled syringe for manual use. What may interact with this medicine? Interactions have not been studied. This list may not describe all possible interactions. Give your health care provider a list of all the medicines, herbs, non-prescription drugs, or dietary supplements you use. Also tell them if you smoke, drink alcohol, or use illegal drugs. Some items may interact with your medicine. What should I watch for while using this medicine? Your condition will be monitored carefully while you are receiving this medicine. You may need blood work done while you are taking this medicine. Talk to your health care provider about your risk of cancer. You may be more at risk for certain types of cancer if you take this medicine. If you are going to need a MRI, CT scan, or other procedure, tell your doctor that you are using this medicine (On-Body Injector only). What side effects may I notice from receiving this medicine? Side effects that you should report to your doctor or health care professional as soon as possible:  allergic reactions (skin rash, itching or hives, swelling of   the face, lips, or tongue)  back pain  dizziness  fever  pain, redness, or irritation at site where injected  pinpoint red spots on the skin  red or dark-brown urine  shortness of breath or breathing problems  stomach or side pain, or pain at the shoulder  swelling  tiredness  trouble  passing urine or change in the amount of urine  unusual bruising or bleeding Side effects that usually do not require medical attention (report to your doctor or health care professional if they continue or are bothersome):  bone pain  muscle pain This list may not describe all possible side effects. Call your doctor for medical advice about side effects. You may report side effects to FDA at 1-800-FDA-1088. Where should I keep my medicine? Keep out of the reach of children. If you are using this medicine at home, you will be instructed on how to store it. Throw away any unused medicine after the expiration date on the label. NOTE: This sheet is a summary. It may not cover all possible information. If you have questions about this medicine, talk to your doctor, pharmacist, or health care provider.  2021 Elsevier/Gold Standard (2019-01-21 13:20:51)  

## 2020-02-15 NOTE — Telephone Encounter (Signed)
Scheduled appointments per 1/31 los. Spoke to patient who is aware of appointments dates and times.

## 2020-02-21 ENCOUNTER — Ambulatory Visit (INDEPENDENT_AMBULATORY_CARE_PROVIDER_SITE_OTHER): Payer: Medicare Other | Admitting: Podiatry

## 2020-02-21 ENCOUNTER — Other Ambulatory Visit: Payer: Self-pay | Admitting: Podiatry

## 2020-02-21 ENCOUNTER — Other Ambulatory Visit: Payer: Self-pay

## 2020-02-21 DIAGNOSIS — G620 Drug-induced polyneuropathy: Secondary | ICD-10-CM | POA: Diagnosis not present

## 2020-02-21 DIAGNOSIS — T451X5A Adverse effect of antineoplastic and immunosuppressive drugs, initial encounter: Secondary | ICD-10-CM

## 2020-02-21 DIAGNOSIS — B351 Tinea unguium: Secondary | ICD-10-CM | POA: Diagnosis not present

## 2020-02-21 MED ORDER — CICLOPIROX 8 % EX SOLN: Freq: Every day | CUTANEOUS | 2 refills | Status: AC

## 2020-02-22 ENCOUNTER — Encounter: Payer: Self-pay | Admitting: Podiatry

## 2020-02-22 NOTE — Progress Notes (Signed)
  Subjective:  Patient ID: Alexander Reeves., male    DOB: 10-03-1965,  MRN: [id number]  Chief Complaint  Patient presents with  . Foot Pain    PT stated that he is doing better he stated that at night he will use the cream and he can feel a tingling sensation but then it will go away. He has no major concerns at this time.     55 y.o. male returns with the above complaint. History confirmed with patient.  Doing well.  The gabapentin has helped significantly.  He started using the ciclopirox nail polish but the bottle evaporated very quickly and is out of it.  He is here today with an ASL interpreter in person  Objective:  Physical Exam: warm, good capillary refill, no trophic changes or ulcerative lesions, normal DP and PT pulses.  Mild discoloration with dystrophy of the toenails bilateral hallux Left Foot: Mild tenderness palpation to metatarsal heads 1-3.  No pinpoint pain to suggest sesamoiditis Right Foot: Mild tenderness to palpation to metatarsal heads 1-3.  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 Onychomycosis      Plan:  Patient was evaluated and treated and all questions answered.   -Continue wearing supportive shoe gear -Continue lidocaine cream as needed -Continue gabapentin 300 mg nightly for the neuropathy pain.  We reviewed the benefits and potential risks and side effects of this. -Continue use of Penlac, I sent in refills for this  Return in about 4 months (around 06/20/2020).

## 2020-02-26 ENCOUNTER — Other Ambulatory Visit: Payer: Self-pay | Admitting: Oncology

## 2020-02-27 ENCOUNTER — Inpatient Hospital Stay: Payer: Medicare Other

## 2020-02-27 ENCOUNTER — Other Ambulatory Visit (INDEPENDENT_AMBULATORY_CARE_PROVIDER_SITE_OTHER): Payer: Self-pay | Admitting: Primary Care

## 2020-02-27 ENCOUNTER — Other Ambulatory Visit: Payer: Self-pay | Admitting: Family Medicine

## 2020-02-27 ENCOUNTER — Inpatient Hospital Stay (HOSPITAL_BASED_OUTPATIENT_CLINIC_OR_DEPARTMENT_OTHER): Payer: Medicare Other | Admitting: Oncology

## 2020-02-27 ENCOUNTER — Other Ambulatory Visit: Payer: Self-pay

## 2020-02-27 VITALS — BP 134/89 | HR 83 | Temp 98.1°F | Resp 20 | Ht 70.0 in | Wt 145.8 lb

## 2020-02-27 DIAGNOSIS — C779 Secondary and unspecified malignant neoplasm of lymph node, unspecified: Secondary | ICD-10-CM | POA: Diagnosis not present

## 2020-02-27 DIAGNOSIS — Z760 Encounter for issue of repeat prescription: Secondary | ICD-10-CM

## 2020-02-27 DIAGNOSIS — Z5111 Encounter for antineoplastic chemotherapy: Secondary | ICD-10-CM | POA: Diagnosis not present

## 2020-02-27 DIAGNOSIS — C182 Malignant neoplasm of ascending colon: Secondary | ICD-10-CM

## 2020-02-27 DIAGNOSIS — D701 Agranulocytosis secondary to cancer chemotherapy: Secondary | ICD-10-CM | POA: Diagnosis not present

## 2020-02-27 DIAGNOSIS — C189 Malignant neoplasm of colon, unspecified: Secondary | ICD-10-CM

## 2020-02-27 DIAGNOSIS — Z95828 Presence of other vascular implants and grafts: Secondary | ICD-10-CM

## 2020-02-27 DIAGNOSIS — C787 Secondary malignant neoplasm of liver and intrahepatic bile duct: Secondary | ICD-10-CM | POA: Diagnosis not present

## 2020-02-27 LAB — CBC WITH DIFFERENTIAL (CANCER CENTER ONLY)
Abs Immature Granulocytes: 0.23 10*3/uL — ABNORMAL HIGH (ref 0.00–0.07)
Basophils Absolute: 0.1 10*3/uL (ref 0.0–0.1)
Basophils Relative: 1 %
Eosinophils Absolute: 0.1 10*3/uL (ref 0.0–0.5)
Eosinophils Relative: 1 %
HCT: 34.1 % — ABNORMAL LOW (ref 39.0–52.0)
Hemoglobin: 10.9 g/dL — ABNORMAL LOW (ref 13.0–17.0)
Immature Granulocytes: 4 %
Lymphocytes Relative: 33 %
Lymphs Abs: 1.8 10*3/uL (ref 0.7–4.0)
MCH: 26.7 pg (ref 26.0–34.0)
MCHC: 32 g/dL (ref 30.0–36.0)
MCV: 83.6 fL (ref 80.0–100.0)
Monocytes Absolute: 0.6 10*3/uL (ref 0.1–1.0)
Monocytes Relative: 11 %
Neutro Abs: 2.6 10*3/uL (ref 1.7–7.7)
Neutrophils Relative %: 50 %
Platelet Count: 203 10*3/uL (ref 150–400)
RBC: 4.08 MIL/uL — ABNORMAL LOW (ref 4.22–5.81)
RDW: 17.8 % — ABNORMAL HIGH (ref 11.5–15.5)
WBC Count: 5.4 10*3/uL (ref 4.0–10.5)
nRBC: 0.4 % — ABNORMAL HIGH (ref 0.0–0.2)

## 2020-02-27 LAB — CMP (CANCER CENTER ONLY)
ALT: 11 U/L (ref 0–44)
AST: 12 U/L — ABNORMAL LOW (ref 15–41)
Albumin: 4 g/dL (ref 3.5–5.0)
Alkaline Phosphatase: 75 U/L (ref 38–126)
Anion gap: 5 (ref 5–15)
BUN: 14 mg/dL (ref 6–20)
CO2: 26 mmol/L (ref 22–32)
Calcium: 9.3 mg/dL (ref 8.9–10.3)
Chloride: 110 mmol/L (ref 98–111)
Creatinine: 1.61 mg/dL — ABNORMAL HIGH (ref 0.61–1.24)
GFR, Estimated: 51 mL/min — ABNORMAL LOW (ref >60)
Glucose, Bld: 94 mg/dL (ref 70–99)
Potassium: 4.3 mmol/L (ref 3.5–5.1)
Sodium: 141 mmol/L (ref 135–145)
Total Bilirubin: <0.2 mg/dL — ABNORMAL LOW (ref 0.3–1.2)
Total Protein: 7 g/dL (ref 6.5–8.1)

## 2020-02-27 MED ORDER — FLUOROURACIL CHEMO INJECTION 2.5 GM/50ML
400.0000 mg/m2 | Freq: Once | INTRAVENOUS | Status: AC
  Administered 2020-02-27: 700 mg via INTRAVENOUS
  Filled 2020-02-27: qty 14

## 2020-02-27 MED ORDER — SODIUM CHLORIDE 0.9 % IV SOLN
10.0000 mg | Freq: Once | INTRAVENOUS | Status: AC
  Administered 2020-02-27: 10 mg via INTRAVENOUS
  Filled 2020-02-27: qty 10

## 2020-02-27 MED ORDER — SODIUM CHLORIDE 0.9 % IV SOLN
400.0000 mg/m2 | Freq: Once | INTRAVENOUS | Status: AC
  Administered 2020-02-27: 716 mg via INTRAVENOUS
  Filled 2020-02-27: qty 35.8

## 2020-02-27 MED ORDER — SODIUM CHLORIDE 0.9 % IV SOLN
Freq: Once | INTRAVENOUS | Status: AC
  Filled 2020-02-27: qty 250

## 2020-02-27 MED ORDER — SODIUM CHLORIDE 0.9% FLUSH
10.0000 mL | INTRAVENOUS | Status: DC | PRN
  Filled 2020-02-27: qty 10

## 2020-02-27 MED ORDER — HEPARIN SOD (PORK) LOCK FLUSH 100 UNIT/ML IV SOLN
500.0000 [IU] | Freq: Once | INTRAVENOUS | Status: DC | PRN
  Filled 2020-02-27: qty 5

## 2020-02-27 MED ORDER — ATROPINE SULFATE 1 MG/ML IJ SOLN
INTRAMUSCULAR | Status: AC
  Filled 2020-02-27: qty 1

## 2020-02-27 MED ORDER — SODIUM CHLORIDE 0.9 % IV SOLN
2400.0000 mg/m2 | INTRAVENOUS | Status: DC
  Administered 2020-02-27: 4300 mg via INTRAVENOUS
  Filled 2020-02-27: qty 86

## 2020-02-27 MED ORDER — PALONOSETRON HCL INJECTION 0.25 MG/5ML
0.2500 mg | Freq: Once | INTRAVENOUS | Status: AC
  Administered 2020-02-27: 0.25 mg via INTRAVENOUS

## 2020-02-27 MED ORDER — ATROPINE SULFATE 1 MG/ML IJ SOLN
0.5000 mg | Freq: Once | INTRAMUSCULAR | Status: AC | PRN
  Administered 2020-02-27: 0.5 mg via INTRAVENOUS

## 2020-02-27 MED ORDER — ALBUTEROL SULFATE HFA 108 (90 BASE) MCG/ACT IN AERS: 2.0000 | INHALATION_SPRAY | RESPIRATORY_TRACT | 3 refills | Status: DC | PRN

## 2020-02-27 MED ORDER — SODIUM CHLORIDE 0.9 % IV SOLN
180.0000 mg/m2 | Freq: Once | INTRAVENOUS | Status: AC
  Administered 2020-02-27: 320 mg via INTRAVENOUS
  Filled 2020-02-27: qty 15

## 2020-02-27 MED ORDER — PALONOSETRON HCL INJECTION 0.25 MG/5ML
INTRAVENOUS | Status: AC
  Filled 2020-02-27: qty 5

## 2020-02-27 NOTE — Patient Instructions (Signed)

## 2020-02-27 NOTE — Progress Notes (Signed)
[place] OFFICE PROGRESS NOTE   Diagnosis: Colon cancer  INTERVAL HISTORY:   Alexander Reeves completed another cycle of FOLFIRI on 02/13/2020. He is here today with a sign language interpreter.  No mouth sores or diarrhea.  He has occasional back pain.  He has bleeding only with bowel movements.  He has developed another "hemorrhoid ".  He is scheduled to see surgery on 03/07/2020.  The perirectal abscess has resolved.  No neuropathy symptoms.  Objective:  Vital signs in last 24 hours:  Blood pressure 134/89, pulse 83, temperature 98.1 F (36.7 C), temperature source Tympanic, resp. rate 20, height 5' 10" (1.778 m), weight 145 lb 12.8 oz (66.1 kg), SpO2 100 %.    HEENT: Hyperpigmentation of the buccal mucosa, no thrush or ulcers Resp: Lungs clear bilaterally, no respiratory distress Cardio: Regular rate and rhythm GI: No hepatosplenomegaly, nontender, no mass Vascular: No leg edema  Skin: Skin thickening and hyperpigmentation of the hands  Portacath/PICC-without erythema  Lab Results:  Lab Results  Component Value Date   WBC 5.4 02/27/2020   HGB 10.9 (L) 02/27/2020   HCT 34.1 (L) 02/27/2020   MCV 83.6 02/27/2020   PLT 203 02/27/2020   NEUTROABS 2.6 02/27/2020    CMP  Lab Results  Component Value Date   NA 141 02/27/2020   K 4.3 02/27/2020   CL 110 02/27/2020   CO2 26 02/27/2020   GLUCOSE 94 02/27/2020   BUN 14 02/27/2020   CREATININE 1.61 (H) 02/27/2020   CALCIUM 9.3 02/27/2020   PROT 7.0 02/27/2020   ALBUMIN 4.0 02/27/2020   AST 12 (L) 02/27/2020   ALT 11 02/27/2020   ALKPHOS 75 02/27/2020   BILITOT <0.2 (L) 02/27/2020   GFRNONAA 51 (L) 02/27/2020   GFRAA >60 09/28/2019    Lab Results  Component Value Date   CEA1 1.75 09/28/2019     Medications: I have reviewed the patient's current medications.   Assessment/Plan:  1.Moderately differentiated adenocarcinoma ascending colon, stage IIIb (pT3pN1), status post a right colectomy  11/19/2018 ? Lymphovascular and perineural invasion present, 2/14 lymph nodes positive, tumor deposits present ? Positive radial margin, no loss of mismatch repair protein expression, discussed case with Dr. Craig Staggers mass with surrounding inflammation at multiple margins, no gross residual disease, unclear which is the "positive "radial margin, further surgery and radiation not recommended ? Colonoscopy 11/19/2018-completely obstructing mid ascending colon mass, could not be passed with endoscope, biopsy confirmed invasive adenocarcinoma ? CT abdomen/pelvis 11/18/2018-wall thickening at the mid and distal ascending colon with mild distention of the proximal ascending colon and cecum ? CTs 10/17/2018--no acute findings, no chest lymphadenopathy, lungs clear ? Cycle 1 FOLFOX 12/28/2018 ? Cycle 2 FOLFOX 01/11/2019, Udenyca added ? Cycle 3 FOLFOX 01/25/2019,Udenyca held due to bone pain ? Cycle 4 FOLFOX 02/21/2019, Udenyca added ? Cycle 5 FOLFOX 03/09/2019, Udenyca ? Cycle 6 FOLFOX 03/21/2019, Udenyca ? Cycle 7 FOLFOX 04/04/2019, Udenyca ? Cycle 8 FOLFOX 04/18/2019, Udenyca ? Cycle 9 FOLFOX 05/02/2019, Udenyca ? Cycle 10 FOLFOX 05/16/2019, oxaliplatin, 5-FU bolus, and Udenyca held ? Cycle 11 FOLFOX 05/30/2019, oxaliplatin, 5-FU bolus, and Udenyca held ? Cycle 12 FOLFOX 06/15/2019, oxaliplatin, 5-FU bolus and Udenyca held ? CTs 09/29/2019-new hypodense enhancing liver masses consistent with metastatic disease, indistinct marginated nodularity below the pancreas head-likely small lymph nodes ? Biopsy liver lesion 10/18/2019-adenocarcinoma consistent with history of colorectal carcinoma ? Cycle 1 FOLFIRI/Avastin 11/01/2019 ? Cycle 2 FOLFIRI/Avastin 11/15/2019 ? Cycle 3 FOLFIRI/Avastin 11/29/2019 ? Cycle 4 FOLFIRI/Avastin 12/13/2019 ? CT  chest 12/27/2019-no significant change in hepatic metastases, submassive pulmonary emboli ? Cycle 5 FOLFIRI 01/10/2020 (Avastin discontinued) ? Cycle 6 FOLFIRI  02/01/2020 ? Cycle 7 FOLFIRI 02/13/2020 ? Cycle 8 FOLFIRI 02/27/2020  2. Deaf 3. Right epididymal cyst removal 03/22/2018 4. Asthma 5. Port-A-Cath placement, [name], 12/23/2018 6. Neutropenia secondary to chemotherapy-Udenyca added for cycle 2 FOLFOX 7. Admission with febrile neutropenia 02/08/2019 8. Hospital admission with submassive PEand left lower extremity DVTon 12/27/2019, heparin, transition to Xarelto 12/28/2019  CT chest 12/27/2019-submassive pulmonary emboli with evidence of right heart strain, no significant change in hepatic metastases  9.  01/24/2020 question perianal abscess/cyst-referred to surgery, treated with antibiotics-improved     Disposition: [name] appears stable.  He will complete another treatment with FOLFIRI today.  He will return for a creatinine check and restaging CT on 03/09/2020.  He is scheduled for a follow-up office visit on 03/12/2020.  Mr. Matranga will see the surgeon next week to evaluate the "hemorrhoid ".  Betsy Coder, MD  02/27/2020  10:02 AM

## 2020-02-27 NOTE — Patient Instructions (Signed)
[facility] Discharge Instructions for Patients Receiving Chemotherapy  Today you received the following chemotherapy agents: Irinotecan, leucovorin, 5FU  To help prevent nausea and vomiting after your treatment, we encourage you to take your nausea medication as directed.   If you develop nausea and vomiting that is not controlled by your nausea medication, call the clinic.   BELOW ARE SYMPTOMS THAT SHOULD BE REPORTED IMMEDIATELY:  *FEVER GREATER THAN 100.5 F  *CHILLS WITH OR WITHOUT FEVER  NAUSEA AND VOMITING THAT IS NOT CONTROLLED WITH YOUR NAUSEA MEDICATION  *UNUSUAL SHORTNESS OF BREATH  *UNUSUAL BRUISING OR BLEEDING  TENDERNESS IN MOUTH AND THROAT WITH OR WITHOUT PRESENCE OF ULCERS  *URINARY PROBLEMS  *BOWEL PROBLEMS  UNUSUAL RASH Items with * indicate a potential emergency and should be followed up as soon as possible.  Feel free to call the clinic should you have any questions or concerns. The clinic phone number is [phone].  Please show the [facility] at check-in to the Emergency Department and triage nurse.

## 2020-02-27 NOTE — Telephone Encounter (Signed)
   Notes to clinic: Patient requesting medication that was filled by another provider Review for refill    Requested Prescriptions  Pending Prescriptions Disp Refills   albuterol (VENTOLIN HFA) 108 (90 Base) MCG/ACT inhaler 6.7 g 3    Sig: Inhale 2 puffs into the lungs every 4 (four) hours as needed for wheezing or shortness of breath.      Pulmonology:  Beta Agonists Failed - 02/27/2020  2:58 PM      Failed - One inhaler should last at least one month. If the patient is requesting refills earlier, contact the patient to check for uncontrolled symptoms.      Passed - Valid encounter within last 12 months    Recent Outpatient Visits           2 weeks ago Nonspeaking deaf   [facility] [name], NP   5 months ago Nonspeaking deaf   [facility] [name], NP   6 months ago Arthralgia, unspecified joint   [facility] [name], NP   1 year ago Annual physical exam   [facility] [name], NP   2 years ago Severe persistent asthma with acute exacerbation   [facility] Elsie Stain, MD       Future Appointments             In 1 month Lavonna Monarch, MD [facility], [place]

## 2020-02-27 NOTE — Progress Notes (Signed)
Pt had several questions today about his medications. Pt PCP sent in a neb. Prescription, [facility] sent in the antibiotics he needed, and Dr.Sherill recommended ibuprofen for his pain. Pt was made aware of these changes and all questions were answered. Pt rides made and pt stable at discharge.

## 2020-02-27 NOTE — Progress Notes (Signed)
Per Dr. Benay Spice, okay for patient to receive treatment today with creatine 1.61.

## 2020-02-27 NOTE — Telephone Encounter (Signed)
Medication Refill - Medication: albuterol (VENTOLIN HFA) 108 (90 Base) MCG/ACT inhaler   Has the patient contacted their pharmacy? Yes.   (Agent: If no, request that the patient contact the pharmacy for the refill.) (Agent: If yes, when and what did the pharmacy advise?)no Rx   Preferred Pharmacy (with phone number or street name):  [facility] ([place]), [place] - [address] Phone:  [phone]  Fax:  [phone]       Agent: Please be advised that RX refills may take up to 3 business days. We ask that you follow-up with your pharmacy.

## 2020-02-28 ENCOUNTER — Telehealth: Payer: Self-pay | Admitting: Oncology

## 2020-02-28 MED FILL — XARELTO 20 MG TABLET: 20 | 30 days supply | Qty: 30 | Fill #0

## 2020-02-28 NOTE — Telephone Encounter (Signed)
Scheduled appointments per 2/14 los. Called patient, no answer. Left message with appointments dates and times.

## 2020-02-29 ENCOUNTER — Inpatient Hospital Stay: Payer: Medicare Other

## 2020-02-29 ENCOUNTER — Other Ambulatory Visit: Payer: Self-pay

## 2020-02-29 VITALS — BP 122/82 | HR 88 | Temp 98.1°F | Resp 18

## 2020-02-29 DIAGNOSIS — Z5111 Encounter for antineoplastic chemotherapy: Secondary | ICD-10-CM | POA: Diagnosis not present

## 2020-02-29 DIAGNOSIS — C787 Secondary malignant neoplasm of liver and intrahepatic bile duct: Secondary | ICD-10-CM | POA: Diagnosis not present

## 2020-02-29 DIAGNOSIS — C182 Malignant neoplasm of ascending colon: Secondary | ICD-10-CM | POA: Diagnosis not present

## 2020-02-29 DIAGNOSIS — C189 Malignant neoplasm of colon, unspecified: Secondary | ICD-10-CM

## 2020-02-29 DIAGNOSIS — C779 Secondary and unspecified malignant neoplasm of lymph node, unspecified: Secondary | ICD-10-CM | POA: Diagnosis not present

## 2020-02-29 DIAGNOSIS — D701 Agranulocytosis secondary to cancer chemotherapy: Secondary | ICD-10-CM | POA: Diagnosis not present

## 2020-02-29 MED ORDER — HEPARIN SOD (PORK) LOCK FLUSH 100 UNIT/ML IV SOLN
500.0000 [IU] | Freq: Once | INTRAVENOUS | Status: AC | PRN
  Administered 2020-02-29: 500 [IU]
  Filled 2020-02-29: qty 5

## 2020-02-29 MED ORDER — SODIUM CHLORIDE 0.9% FLUSH
10.0000 mL | INTRAVENOUS | Status: DC | PRN
  Administered 2020-02-29: 10 mL
  Filled 2020-02-29: qty 10

## 2020-02-29 MED ORDER — PEGFILGRASTIM-CBQV 6 MG/0.6ML ~~LOC~~ SOSY
6.0000 mg | PREFILLED_SYRINGE | Freq: Once | SUBCUTANEOUS | Status: AC
Start: 2020-02-29 — End: 2020-02-29
  Administered 2020-02-29: 6 mg via SUBCUTANEOUS

## 2020-02-29 MED ORDER — PEGFILGRASTIM-CBQV 6 MG/0.6ML ~~LOC~~ SOSY
PREFILLED_SYRINGE | SUBCUTANEOUS | Status: AC
  Filled 2020-02-29: qty 0.6

## 2020-02-29 NOTE — Patient Instructions (Signed)
Pegfilgrastim injection What is this medicine? PEGFILGRASTIM (PEG fil gra stim) is a long-acting granulocyte colony-stimulating factor that stimulates the growth of neutrophils, a type of white blood cell important in the body's fight against infection. It is used to reduce the incidence of fever and infection in patients with certain types of cancer who are receiving chemotherapy that affects the bone marrow, and to increase survival after being exposed to high doses of radiation. This medicine may be used for other purposes; ask your health care provider or pharmacist if you have questions. COMMON BRAND NAME(S): Fulphila, Neulasta, Nyvepria, UDENYCA, Ziextenzo What should I tell my health care provider before I take this medicine? They need to know if you have any of these conditions:  kidney disease  latex allergy  ongoing radiation therapy  sickle cell disease  skin reactions to acrylic adhesives (On-Body Injector only)  an unusual or allergic reaction to pegfilgrastim, filgrastim, other medicines, foods, dyes, or preservatives  pregnant or trying to get pregnant  breast-feeding How should I use this medicine? This medicine is for injection under the skin. If you get this medicine at home, you will be taught how to prepare and give the pre-filled syringe or how to use the On-body Injector. Refer to the patient Instructions for Use for detailed instructions. Use exactly as directed. Tell your healthcare provider immediately if you suspect that the On-body Injector may not have performed as intended or if you suspect the use of the On-body Injector resulted in a missed or partial dose. It is important that you put your used needles and syringes in a special sharps container. Do not put them in a trash can. If you do not have a sharps container, call your pharmacist or healthcare provider to get one. Talk to your pediatrician regarding the use of this medicine in children. While this drug  may be prescribed for selected conditions, precautions do apply. Overdosage: If you think you have taken too much of this medicine contact a poison control center or emergency room at once. NOTE: This medicine is only for you. Do not share this medicine with others. What if I miss a dose? It is important not to miss your dose. Call your doctor or health care professional if you miss your dose. If you miss a dose due to an On-body Injector failure or leakage, a new dose should be administered as soon as possible using a single prefilled syringe for manual use. What may interact with this medicine? Interactions have not been studied. This list may not describe all possible interactions. Give your health care provider a list of all the medicines, herbs, non-prescription drugs, or dietary supplements you use. Also tell them if you smoke, drink alcohol, or use illegal drugs. Some items may interact with your medicine. What should I watch for while using this medicine? Your condition will be monitored carefully while you are receiving this medicine. You may need blood work done while you are taking this medicine. Talk to your health care provider about your risk of cancer. You may be more at risk for certain types of cancer if you take this medicine. If you are going to need a MRI, CT scan, or other procedure, tell your doctor that you are using this medicine (On-Body Injector only). What side effects may I notice from receiving this medicine? Side effects that you should report to your doctor or health care professional as soon as possible:  allergic reactions (skin rash, itching or hives, swelling of   the face, lips, or tongue)  back pain  dizziness  fever  pain, redness, or irritation at site where injected  pinpoint red spots on the skin  red or dark-brown urine  shortness of breath or breathing problems  stomach or side pain, or pain at the shoulder  swelling  tiredness  trouble  passing urine or change in the amount of urine  unusual bruising or bleeding Side effects that usually do not require medical attention (report to your doctor or health care professional if they continue or are bothersome):  bone pain  muscle pain This list may not describe all possible side effects. Call your doctor for medical advice about side effects. You may report side effects to FDA at 1-800-FDA-1088. Where should I keep my medicine? Keep out of the reach of children. If you are using this medicine at home, you will be instructed on how to store it. Throw away any unused medicine after the expiration date on the label. NOTE: This sheet is a summary. It may not cover all possible information. If you have questions about this medicine, talk to your doctor, pharmacist, or health care provider.  2021 Elsevier/Gold Standard (2019-01-21 13:20:51)  

## 2020-03-01 ENCOUNTER — Telehealth: Payer: Self-pay | Admitting: *Deleted

## 2020-03-01 DIAGNOSIS — M2550 Pain in unspecified joint: Secondary | ICD-10-CM

## 2020-03-01 DIAGNOSIS — M79671 Pain in right foot: Secondary | ICD-10-CM

## 2020-03-01 MED ORDER — IBUPROFEN 600 MG PO TABS: 600.0000 mg | ORAL_TABLET | Freq: Two times a day (BID) | ORAL | 1 refills | Status: AC | PRN

## 2020-03-01 MED ORDER — PANTOPRAZOLE SODIUM 40 MG PO TBEC: 40.0000 mg | DELAYED_RELEASE_TABLET | Freq: Every day | ORAL | 5 refills | Status: DC

## 2020-03-01 MED ORDER — RIVAROXABAN 20 MG PO TABS: 20.0000 mg | ORAL_TABLET | Freq: Every day | ORAL | 5 refills | Status: DC

## 2020-03-01 NOTE — Telephone Encounter (Signed)
Called w/assistance of interpreter for refills on his fenofibrate, ibuprofen, pantoprazole and rivaroxaban. Informed him that the fenofibrate needs to come from his PCP, but Dr. Benay Spice agrees to refill the others

## 2020-03-07 ENCOUNTER — Ambulatory Visit: Payer: Self-pay | Admitting: General Surgery

## 2020-03-07 DIAGNOSIS — K603 Anal fistula: Secondary | ICD-10-CM | POA: Diagnosis not present

## 2020-03-07 DIAGNOSIS — K610 Anal abscess: Secondary | ICD-10-CM | POA: Diagnosis not present

## 2020-03-07 NOTE — H&P (Signed)
  The patient is a 55 year old male who presents with anal pain. He underwent emergent right colectomy for obstructing lesion in Nov 2020 by [name]. Path showed invasive adenoca measuring 3 cm extending into pericolonic soft tissue with multiple soft tissue tumor deposits, 2/14 nodes positive. He is undergoing chemotherapy. He developed rectal pain and spontaneous drainage. 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 He has continued chemotherapy with antibiotics. He continues to have pain, especially with bowel movements. He has intermittent purulent drainage as well. He reports having hemorrhoid surgery in [place] years ago. He last chemo was last week.  ASL interpreter present.   Problem List/Past Medical Leighton Ruff, MD; 0/81/4481 11:45 AM) POSTOPERATIVE STATE [phone]) increase activity as tolerated see back in six months PERIANAL ABSCESS (K61.0)  Diagnostic Studies History Leighton Ruff, MD; 9/70/2637 11:23 AM) Colonoscopy never  Allergies Mammie Lorenzo, LPN; 8/58/8502 77:41 AM) Penicillins Eggs Allergies Reconciled  Medication History Mammie Lorenzo, LPN; 2/87/8676 72:09 AM) Pantoprazole Sodium (40MG  Tablet DR, Oral) Active. Potassium Chloride Crys ER (20MEQ Tablet ER, Oral) Active. Prochlorperazine Maleate (10MG  Tablet, Oral) Active. Albuterol Sulfate HFA (108 (90 Base)MCG/ACT Aerosol Soln, Inhalation) Active. Montelukast Sodium (10MG  Tablet, Oral) Active. Pravastatin Sodium (10MG  Tablet, Oral) Active. Medications Reconciled  Family History Leighton Ruff, MD; 4/70/9628 11:23 AM) Family history unknown First Degree Relatives  Other Problems Leighton Ruff, MD; 3/66/2947 11:45 AM) Asthma Cancer     Review of Systems Leighton Ruff MD; 6/54/6503 11:23 AM) General Not Present- Appetite Loss, Chills, Fatigue, Fever, Night Sweats, Weight Gain and Weight Loss. Skin Not Present- Change in Wart/Mole, Dryness, Hives, Jaundice, New  Lesions, Non-Healing Wounds, Rash and Ulcer. HEENT Not Present- Earache, Hearing Loss, Hoarseness, Nose Bleed, Oral Ulcers, Ringing in the Ears, Seasonal Allergies, Sinus Pain, Sore Throat, Visual Disturbances, Wears glasses/contact lenses and Yellow Eyes. Respiratory Not Present- Bloody sputum, Chronic Cough, Difficulty Breathing, Snoring and Wheezing. Breast Not Present- Breast Mass, Breast Pain, Nipple Discharge and Skin Changes. Cardiovascular Not Present- Chest Pain, Difficulty Breathing Lying Down, Leg Cramps, Palpitations, Rapid Heart Rate, Shortness of Breath and Swelling of Extremities. Gastrointestinal Not Present- Abdominal Pain, Bloating, Bloody Stool, Change in Bowel Habits, Chronic diarrhea, Constipation, Difficulty Swallowing, Excessive gas, Gets full quickly at meals, Hemorrhoids, Indigestion, Nausea, Rectal Pain and Vomiting. Male Genitourinary Not Present- Blood in Urine, Change in Urinary Stream, Frequency, Impotence, Nocturia, Painful Urination, Urgency and Urine Leakage.   Physical Exam Leighton Ruff MD; 5/46/5681 11:23 AM)  General General Appearance-Cooperative.  Rectal Anorectal Exam External - Note: Left posterior lateral perianal mass, most consistent with fistula external opening, very proximal to anal canal.    Assessment & Plan Leighton Ruff MD; 2/75/1700 11:45 AM)  PERIANAL FISTULA  55 year old male with a two-month history of spontaneously draining perirectal abscess. He is on chemotherapy for colon cancer. His last treatment was last week. 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 I recommended exam under anesthesia with seton placement versus fistulotomy. We have discussed the risk of incontinence with fistulotomy and the need for additional surgery of the seton is placed. We have discussed the risk of slow healing due to his chemotherapy. We have decided to wait to approximate one month status post his last chemotherapy date to allow for  proper healing after surgery.

## 2020-03-07 NOTE — H&P (View-Only) (Signed)
  The patient is a 55 year old male who presents with anal pain. He underwent emergent right colectomy for obstructing lesion in Nov 2020 by [name]. Path showed invasive adenoca measuring 3 cm extending into pericolonic soft tissue with multiple soft tissue tumor deposits, 2/14 nodes positive. He is undergoing chemotherapy. He developed rectal pain and spontaneous drainage. 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 He has continued chemotherapy with antibiotics. He continues to have pain, especially with bowel movements. He has intermittent purulent drainage as well. He reports having hemorrhoid surgery in [place] years ago. He last chemo was last week.  ASL interpreter present.   Problem List/Past Medical Leighton Ruff, MD; 5/36/4680 11:45 AM) POSTOPERATIVE STATE [phone]) increase activity as tolerated see back in six months PERIANAL ABSCESS (K61.0)  Diagnostic Studies History Leighton Ruff, MD; 09/07/35 11:23 AM) Colonoscopy never  Allergies Mammie Lorenzo, LPN; 0/48/8891 69:45 AM) Penicillins Eggs Allergies Reconciled  Medication History Mammie Lorenzo, LPN; 0/38/8828 00:34 AM) Pantoprazole Sodium (40MG  Tablet DR, Oral) Active. Potassium Chloride Crys ER (20MEQ Tablet ER, Oral) Active. Prochlorperazine Maleate (10MG  Tablet, Oral) Active. Albuterol Sulfate HFA (108 (90 Base)MCG/ACT Aerosol Soln, Inhalation) Active. Montelukast Sodium (10MG  Tablet, Oral) Active. Pravastatin Sodium (10MG  Tablet, Oral) Active. Medications Reconciled  Family History Leighton Ruff, MD; 09/29/9148 11:23 AM) Family history unknown First Degree Relatives  Other Problems Leighton Ruff, MD; 5/69/7948 11:45 AM) Asthma Cancer     Review of Systems Leighton Ruff MD; 0/16/5537 11:23 AM) General Not Present- Appetite Loss, Chills, Fatigue, Fever, Night Sweats, Weight Gain and Weight Loss. Skin Not Present- Change in Wart/Mole, Dryness, Hives, Jaundice, New  Lesions, Non-Healing Wounds, Rash and Ulcer. HEENT Not Present- Earache, Hearing Loss, Hoarseness, Nose Bleed, Oral Ulcers, Ringing in the Ears, Seasonal Allergies, Sinus Pain, Sore Throat, Visual Disturbances, Wears glasses/contact lenses and Yellow Eyes. Respiratory Not Present- Bloody sputum, Chronic Cough, Difficulty Breathing, Snoring and Wheezing. Breast Not Present- Breast Mass, Breast Pain, Nipple Discharge and Skin Changes. Cardiovascular Not Present- Chest Pain, Difficulty Breathing Lying Down, Leg Cramps, Palpitations, Rapid Heart Rate, Shortness of Breath and Swelling of Extremities. Gastrointestinal Not Present- Abdominal Pain, Bloating, Bloody Stool, Change in Bowel Habits, Chronic diarrhea, Constipation, Difficulty Swallowing, Excessive gas, Gets full quickly at meals, Hemorrhoids, Indigestion, Nausea, Rectal Pain and Vomiting. Male Genitourinary Not Present- Blood in Urine, Change in Urinary Stream, Frequency, Impotence, Nocturia, Painful Urination, Urgency and Urine Leakage.   Physical Exam Leighton Ruff MD; 4/82/7078 11:23 AM)  General General Appearance-Cooperative.  Rectal Anorectal Exam External - Note: Left posterior lateral perianal mass, most consistent with fistula external opening, very proximal to anal canal.    Assessment & Plan Leighton Ruff MD; 6/75/4492 11:45 AM)  PERIANAL FISTULA  55 year old male with a two-month history of spontaneously draining perirectal abscess. He is on chemotherapy for colon cancer. His last treatment was last week. 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 I recommended exam under anesthesia with seton placement versus fistulotomy. We have discussed the risk of incontinence with fistulotomy and the need for additional surgery of the seton is placed. We have discussed the risk of slow healing due to his chemotherapy. We have decided to wait to approximate one month status post his last chemotherapy date to allow for  proper healing after surgery.

## 2020-03-09 ENCOUNTER — Other Ambulatory Visit: Payer: Self-pay

## 2020-03-09 ENCOUNTER — Inpatient Hospital Stay: Payer: Medicare Other

## 2020-03-09 ENCOUNTER — Ambulatory Visit (HOSPITAL_COMMUNITY)
Admission: RE | Admit: 2020-03-09 | Discharge: 2020-03-09 | Disposition: A | Discharge status: Home / Self Care | Payer: Medicare Other | Source: Ambulatory Visit | Attending: Oncology | Admitting: Oncology

## 2020-03-09 DIAGNOSIS — Z5111 Encounter for antineoplastic chemotherapy: Secondary | ICD-10-CM | POA: Diagnosis not present

## 2020-03-09 DIAGNOSIS — Z95828 Presence of other vascular implants and grafts: Secondary | ICD-10-CM

## 2020-03-09 DIAGNOSIS — C182 Malignant neoplasm of ascending colon: Secondary | ICD-10-CM

## 2020-03-09 DIAGNOSIS — C779 Secondary and unspecified malignant neoplasm of lymph node, unspecified: Secondary | ICD-10-CM | POA: Diagnosis not present

## 2020-03-09 DIAGNOSIS — C787 Secondary malignant neoplasm of liver and intrahepatic bile duct: Secondary | ICD-10-CM | POA: Diagnosis not present

## 2020-03-09 DIAGNOSIS — D701 Agranulocytosis secondary to cancer chemotherapy: Secondary | ICD-10-CM | POA: Diagnosis not present

## 2020-03-09 DIAGNOSIS — I700 Atherosclerosis of aorta: Secondary | ICD-10-CM | POA: Diagnosis not present

## 2020-03-09 DIAGNOSIS — C189 Malignant neoplasm of colon, unspecified: Secondary | ICD-10-CM | POA: Diagnosis not present

## 2020-03-09 LAB — BASIC METABOLIC PANEL - CANCER CENTER ONLY
Anion gap: 7 (ref 5–15)
BUN: 13 mg/dL (ref 6–20)
CO2: 24 mmol/L (ref 22–32)
Calcium: 9 mg/dL (ref 8.9–10.3)
Chloride: 107 mmol/L (ref 98–111)
Creatinine: 1.32 mg/dL — ABNORMAL HIGH (ref 0.61–1.24)
GFR, Estimated: >60 mL/min (ref >60)
Glucose, Bld: 94 mg/dL (ref 70–99)
Potassium: 4.2 mmol/L (ref 3.5–5.1)
Sodium: 138 mmol/L (ref 135–145)

## 2020-03-09 IMAGING — CT CT ABD-PELV W/ CM
2 of 5 series · 16 of 46 positions shown, 18 images · IV contrast (OMNIPAQUE)
Comparison: [DATE]

CLINICAL DATA: Follow-up metastatic colon carcinoma. Status post
chemotherapy.

EXAM:
CT ABDOMEN AND PELVIS WITH CONTRAST
TECHNIQUE: Multidetector CT imaging of the abdomen and pelvis was performed
using the standard protocol following bolus administration of
intravenous contrast.
CONTRAST:  100mL OMNIPAQUE IOHEXOL 300 MG/ML  SOLN

[Series 2: axial st · axial · 0.72mm/px · z∈[-361,-1]mm · 13 of 84 slices shown, 15 images]
[im 6/84  soft-tissue]
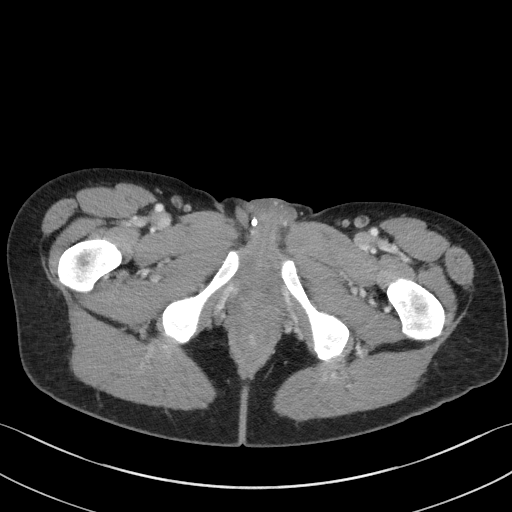
[im 6/84  bone]
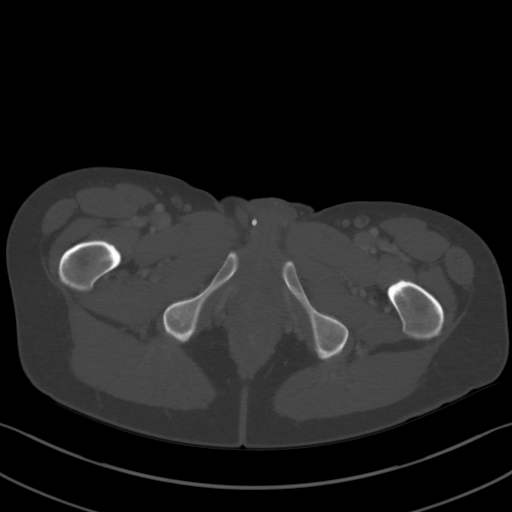
[im 11/84  soft-tissue]
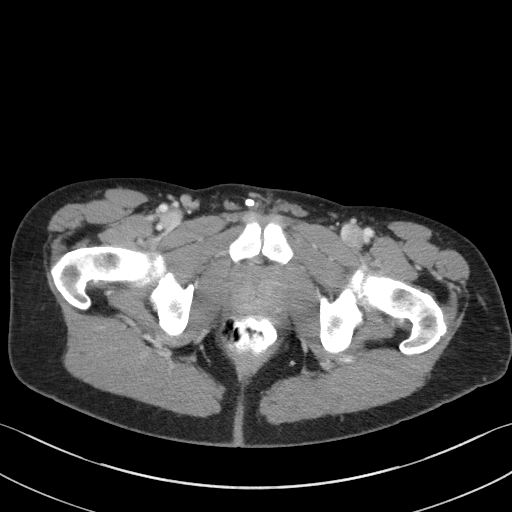
[im 16/84  soft-tissue]
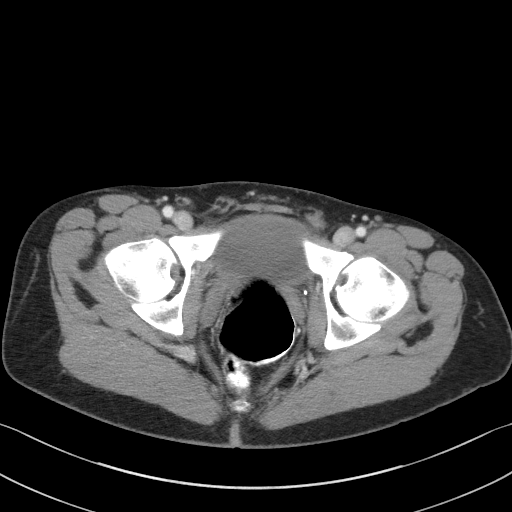
[im 26/84  soft-tissue]
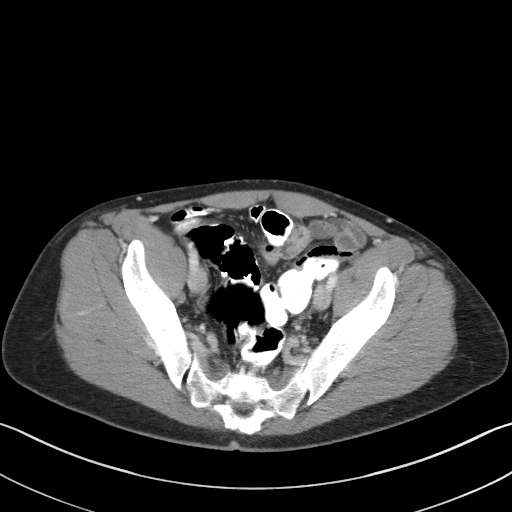
[im 32/84  soft-tissue]
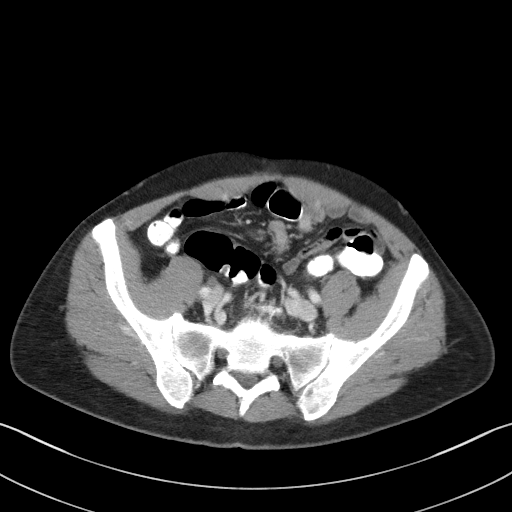
[im 37/84  soft-tissue]
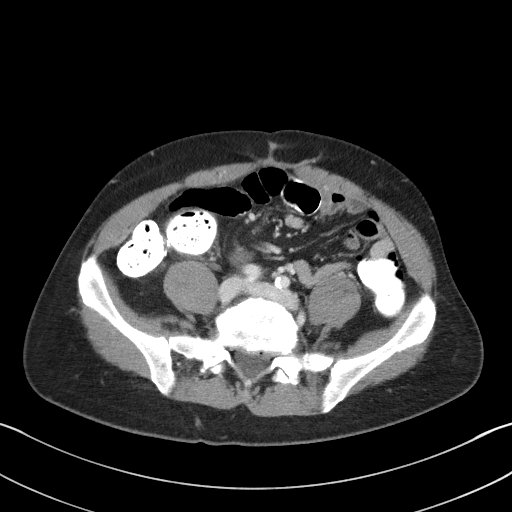
[im 42/84  soft-tissue]
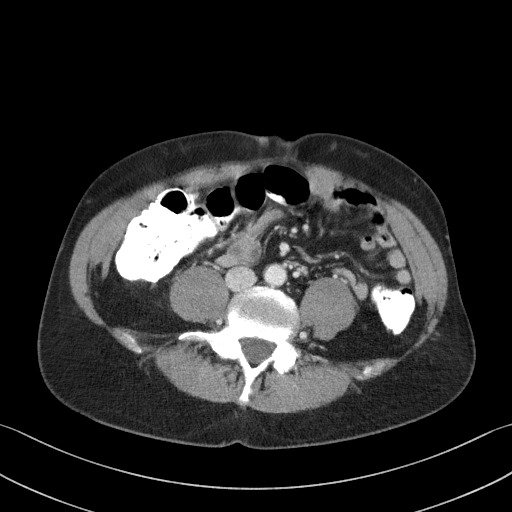
[im 47/84  soft-tissue]
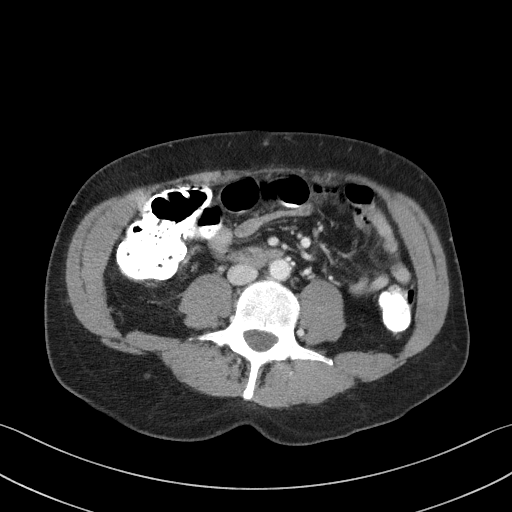
[im 52/84  soft-tissue]
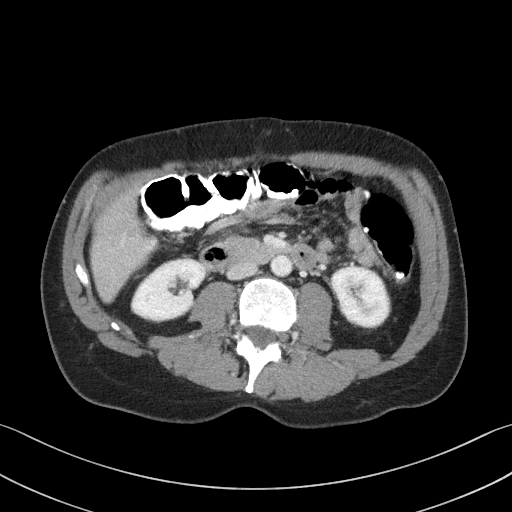
[im 52/84  bone]
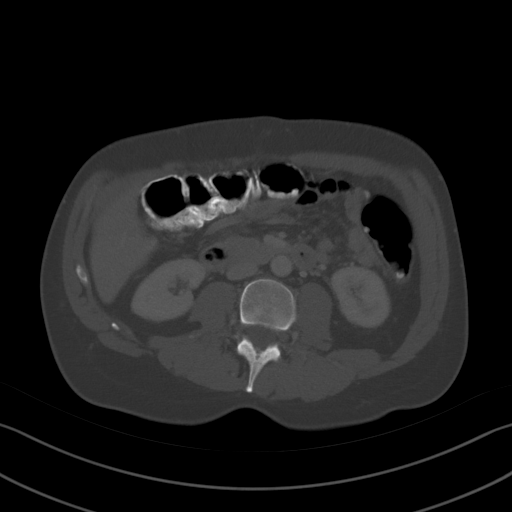
[im 58/84  soft-tissue]
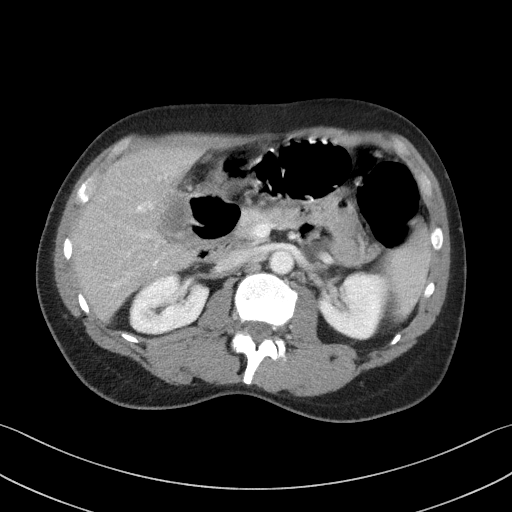
[im 68/84  soft-tissue]
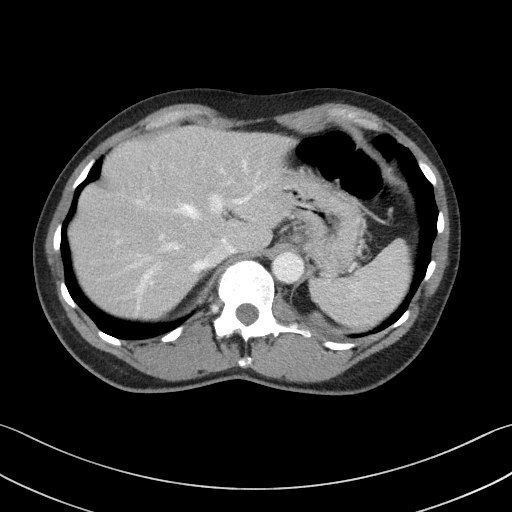
[im 73/84  soft-tissue]
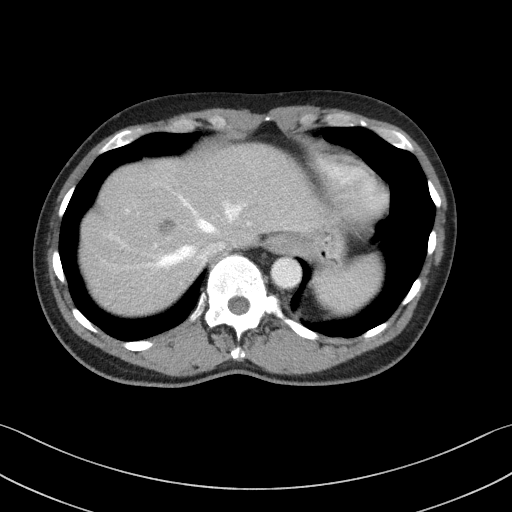
[im 78/84  soft-tissue]
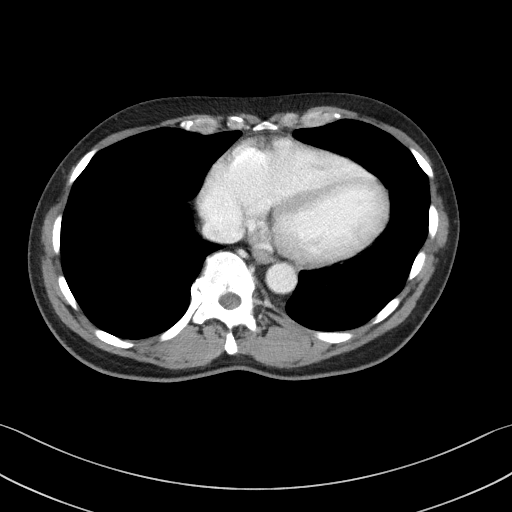

[Series 4: coronal st · coronal · 0.70mm/px · 3 of 81 slices shown]
[im 27/81  soft-tissue]
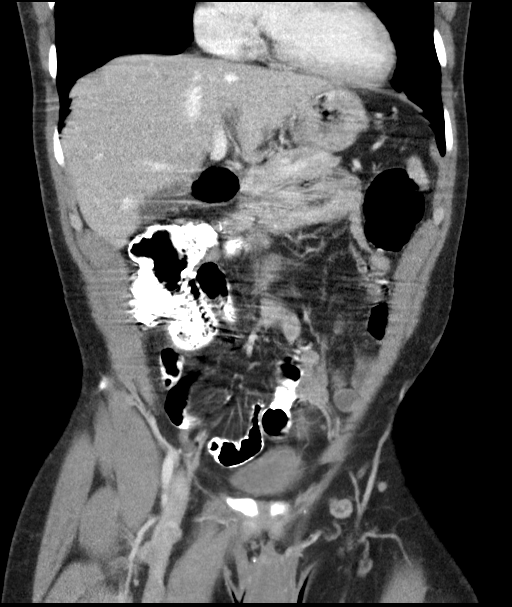
[im 36/81  soft-tissue]
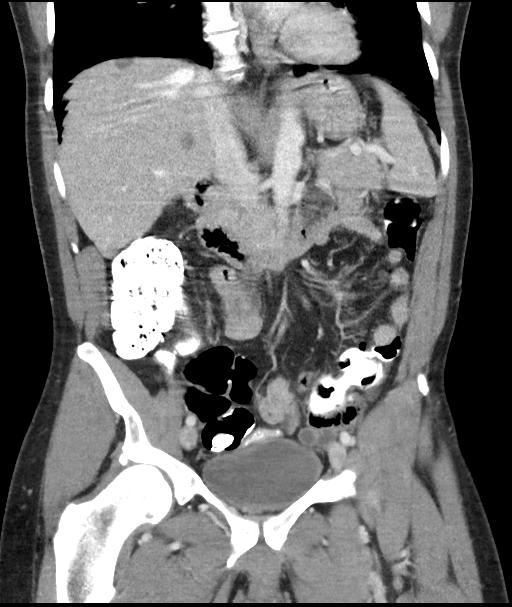
[im 45/81  soft-tissue]
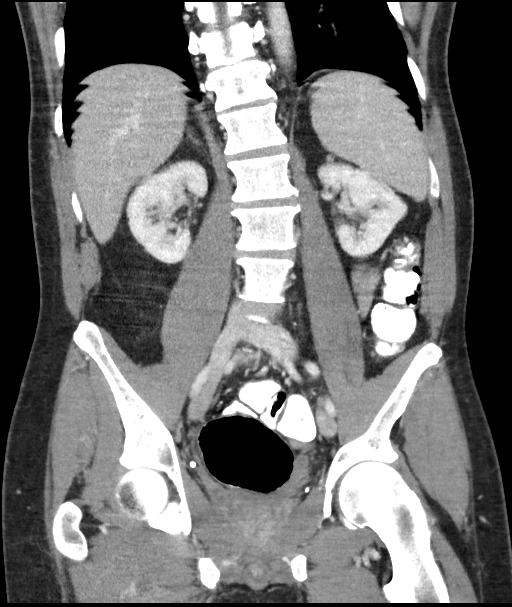

[16 of 46 positions shown; findings below may reference images not displayed]

FINDINGS: Lower Chest: No acute findings.

Hepatobiliary: Multiple hepatic metastases show mild decrease in
size since previous study. Index metastatic lesion near the junction
of the right and caudate lobes currently measures 1.5 x 1.4 cm,
compared to 2.1 x 2.0 cm previously. No new metastatic lesions are
identified. Gallbladder is unremarkable. No evidence of biliary
ductal dilatation.

Pancreas:  No mass or inflammatory changes.

Spleen: Within normal limits in size and appearance.

Adrenals/Urinary Tract: No masses identified. No evidence of
ureteral calculi or hydronephrosis.

Stomach/Bowel: No evidence of obstruction, inflammatory process or
abnormal fluid collections.

Vascular/Lymphatic: No pathologically enlarged lymph nodes. No
abdominal aortic aneurysm. Aortic atherosclerotic calcification
noted.

Reproductive:  No mass or other significant abnormality.

Other:  None.

Musculoskeletal:  No suspicious bone lesions identified.
IMPRESSION: Mild decrease in hepatic metastatic disease.

No new or progressive metastatic disease within the abdomen or
pelvis.

## 2020-03-09 MED ORDER — IOHEXOL 300 MG/ML  SOLN
100.0000 mL | Freq: Once | INTRAMUSCULAR | Status: AC | PRN
  Administered 2020-03-09: 100 mL via INTRAVENOUS

## 2020-03-09 MED ORDER — HEPARIN SOD (PORK) LOCK FLUSH 100 UNIT/ML IV SOLN
500.0000 [IU] | Freq: Once | INTRAVENOUS | Status: AC
  Administered 2020-03-09: 500 [IU] via INTRAVENOUS

## 2020-03-09 MED ORDER — HEPARIN SOD (PORK) LOCK FLUSH 100 UNIT/ML IV SOLN
INTRAVENOUS | Status: AC
  Filled 2020-03-09: qty 5

## 2020-03-09 MED ORDER — SODIUM CHLORIDE 0.9% FLUSH
10.0000 mL | INTRAVENOUS | Status: DC | PRN
  Administered 2020-03-09: 10 mL
  Filled 2020-03-09: qty 10

## 2020-03-09 NOTE — Patient Instructions (Signed)

## 2020-03-12 ENCOUNTER — Inpatient Hospital Stay: Payer: Medicare Other

## 2020-03-12 ENCOUNTER — Inpatient Hospital Stay: Payer: Medicare Other | Admitting: Nurse Practitioner

## 2020-03-14 ENCOUNTER — Encounter: Payer: Self-pay | Admitting: Nutrition

## 2020-03-14 ENCOUNTER — Other Ambulatory Visit: Payer: Self-pay

## 2020-03-14 ENCOUNTER — Encounter: Payer: Self-pay | Admitting: Nurse Practitioner

## 2020-03-14 ENCOUNTER — Inpatient Hospital Stay: Payer: Medicare Other

## 2020-03-14 ENCOUNTER — Inpatient Hospital Stay: Payer: Medicare Other | Attending: Nurse Practitioner

## 2020-03-14 ENCOUNTER — Inpatient Hospital Stay (HOSPITAL_BASED_OUTPATIENT_CLINIC_OR_DEPARTMENT_OTHER): Payer: Medicare Other | Admitting: Nurse Practitioner

## 2020-03-14 VITALS — BP 123/81 | HR 81 | Temp 97.2°F | Resp 16 | Ht 70.0 in | Wt 146.3 lb

## 2020-03-14 DIAGNOSIS — C787 Secondary malignant neoplasm of liver and intrahepatic bile duct: Secondary | ICD-10-CM | POA: Insufficient documentation

## 2020-03-14 DIAGNOSIS — D701 Agranulocytosis secondary to cancer chemotherapy: Secondary | ICD-10-CM | POA: Diagnosis not present

## 2020-03-14 DIAGNOSIS — T451X5A Adverse effect of antineoplastic and immunosuppressive drugs, initial encounter: Secondary | ICD-10-CM | POA: Insufficient documentation

## 2020-03-14 DIAGNOSIS — Z95828 Presence of other vascular implants and grafts: Secondary | ICD-10-CM

## 2020-03-14 DIAGNOSIS — C182 Malignant neoplasm of ascending colon: Secondary | ICD-10-CM

## 2020-03-14 LAB — CMP (CANCER CENTER ONLY)
ALT: 13 U/L (ref 0–44)
AST: 15 U/L (ref 15–41)
Albumin: 4 g/dL (ref 3.5–5.0)
Alkaline Phosphatase: 83 U/L (ref 38–126)
Anion gap: 9 (ref 5–15)
BUN: 10 mg/dL (ref 6–20)
CO2: 25 mmol/L (ref 22–32)
Calcium: 9.4 mg/dL (ref 8.9–10.3)
Chloride: 106 mmol/L (ref 98–111)
Creatinine: 1.35 mg/dL — ABNORMAL HIGH (ref 0.61–1.24)
GFR, Estimated: >60 mL/min (ref >60)
Glucose, Bld: 100 mg/dL — ABNORMAL HIGH (ref 70–99)
Potassium: 4.4 mmol/L (ref 3.5–5.1)
Sodium: 140 mmol/L (ref 135–145)
Total Bilirubin: 0.3 mg/dL (ref 0.3–1.2)
Total Protein: 7.3 g/dL (ref 6.5–8.1)

## 2020-03-14 LAB — CBC WITH DIFFERENTIAL (CANCER CENTER ONLY)
Abs Immature Granulocytes: 0.58 10*3/uL — ABNORMAL HIGH (ref 0.00–0.07)
Basophils Absolute: 0.1 10*3/uL (ref 0.0–0.1)
Basophils Relative: 1 %
Eosinophils Absolute: 0.2 10*3/uL (ref 0.0–0.5)
Eosinophils Relative: 2 %
HCT: 36.8 % — ABNORMAL LOW (ref 39.0–52.0)
Hemoglobin: 11.4 g/dL — ABNORMAL LOW (ref 13.0–17.0)
Immature Granulocytes: 7 %
Lymphocytes Relative: 17 %
Lymphs Abs: 1.4 10*3/uL (ref 0.7–4.0)
MCH: 25.7 pg — ABNORMAL LOW (ref 26.0–34.0)
MCHC: 31 g/dL (ref 30.0–36.0)
MCV: 83.1 fL (ref 80.0–100.0)
Monocytes Absolute: 0.7 10*3/uL (ref 0.1–1.0)
Monocytes Relative: 9 %
Neutro Abs: 5.2 10*3/uL (ref 1.7–7.7)
Neutrophils Relative %: 64 %
Platelet Count: 186 10*3/uL (ref 150–400)
RBC: 4.43 MIL/uL (ref 4.22–5.81)
RDW: 18.5 % — ABNORMAL HIGH (ref 11.5–15.5)
WBC Count: 8.1 10*3/uL (ref 4.0–10.5)
nRBC: 0.5 % — ABNORMAL HIGH (ref 0.0–0.2)

## 2020-03-14 MED ORDER — SODIUM CHLORIDE 0.9% FLUSH
10.0000 mL | INTRAVENOUS | Status: DC | PRN
  Administered 2020-03-14: 10 mL
  Filled 2020-03-14: qty 10

## 2020-03-14 NOTE — Progress Notes (Signed)
Provided one complimentary case of ensure plus. 

## 2020-03-14 NOTE — Patient Instructions (Signed)
Implanted Port Insertion, Care After This sheet gives you information about how to care for yourself after your procedure. Your health care provider may also give you more specific instructions. If you have problems or questions, contact your health care provider. What can I expect after the procedure? After the procedure, it is common to have:  Discomfort at the port insertion site.  Bruising on the skin over the port. This should improve over 3-4 days. Follow these instructions at home: Port care  After your port is placed, you will get a manufacturer's information card. The card has information about your port. Keep this card with you at all times.  Take care of the port as told by your health care provider. Ask your health care provider if you or a family member can get training for taking care of the port at home. A home health care nurse may also take care of the port.  Make sure to remember what type of port you have. Incision care  Follow instructions from your health care provider about how to take care of your port insertion site. Make sure you: ? Wash your hands with soap and water before and after you change your bandage (dressing). If soap and water are not available, use hand sanitizer. ? Change your dressing as told by your health care provider. ? Leave stitches (sutures), skin glue, or adhesive strips in place. These skin closures may need to stay in place for 2 weeks or longer. If adhesive strip edges start to loosen and curl up, you may trim the loose edges. Do not remove adhesive strips completely unless your health care provider tells you to do that.  Check your port insertion site every day for signs of infection. Check for: ? Redness, swelling, or pain. ? Fluid or blood. ? Warmth. ? Pus or a bad smell.      Activity  Return to your normal activities as told by your health care provider. Ask your health care provider what activities are safe for you.  Do not  lift anything that is heavier than 10 lb (4.5 kg), or the limit that you are told, until your health care provider says that it is safe. General instructions  Take over-the-counter and prescription medicines only as told by your health care provider.  Do not take baths, swim, or use a hot tub until your health care provider approves. Ask your health care provider if you may take showers. You may only be allowed to take sponge baths.  Do not drive for 24 hours if you were given a sedative during your procedure.  Wear a medical alert bracelet in case of an emergency. This will tell any health care providers that you have a port.  Keep all follow-up visits as told by your health care provider. This is important. Contact a health care provider if:  You cannot flush your port with saline as directed, or you cannot draw blood from the port.  You have a fever or chills.  You have redness, swelling, or pain around your port insertion site.  You have fluid or blood coming from your port insertion site.  Your port insertion site feels warm to the touch.  You have pus or a bad smell coming from the port insertion site. Get help right away if:  You have chest pain or shortness of breath.  You have bleeding from your port that you cannot control. Summary  Take care of the port as told by your   health care provider. Keep the manufacturer's information card with you at all times.  Change your dressing as told by your health care provider.  Contact a health care provider if you have a fever or chills or if you have redness, swelling, or pain around your port insertion site.  Keep all follow-up visits as told by your health care provider. This information is not intended to replace advice given to you by your health care provider. Make sure you discuss any questions you have with your health care provider. Document Revised: 07/28/2017 Document Reviewed: 07/28/2017 Elsevier Patient Education   2021 Elsevier Inc.  

## 2020-03-14 NOTE — Progress Notes (Signed)
[facility] OFFICE PROGRESS NOTE   Diagnosis: Colon cancer  INTERVAL HISTORY:   Alexander Reeves returns for follow-up.  He completed cycle 8 FOLFIRI 02/27/2020.  He denies nausea/vomiting.  Diarrhea for a few days.  He has pain and intermittent bleeding with bowel movements.  Persistent tingling in the feet.  Objective:  Vital signs in last 24 hours:  Blood pressure 123/81, pulse 81, temperature (!) 97.2 F (36.2 C), temperature source Tympanic, resp. rate 16, height '5\' 10"'  (1.778 m), weight 146 lb 4.8 oz (66.4 kg), SpO2 100 %.    HEENT: Hyperpigmentation in the mouth.  Mild white coating over tongue.  No ulcers. Resp: Lungs clear bilaterally. Cardio: Regular rate and rhythm. GI: Abdomen soft and nontender.  No hepatomegaly. Vascular: No leg edema. Skin: Palms with hyperpigmentation. Port-A-Cath without erythema.   Lab Results:  Lab Results  Component Value Date   WBC 8.1 03/14/2020   HGB 11.4 (L) 03/14/2020   HCT 36.8 (L) 03/14/2020   MCV 83.1 03/14/2020   PLT 186 03/14/2020   NEUTROABS 5.2 03/14/2020    Imaging:  No results found.  Medications: I have reviewed the patient's current medications.  Assessment/Plan: 1.Moderately differentiated adenocarcinoma ascending colon, stage IIIb (pT3pN1), status post a right colectomy 11/19/2018 ? Lymphovascular and perineural invasion present, 2/14 lymph nodes positive, tumor deposits present ? Positive radial margin, no loss of mismatch repair protein expression, discussed case with Dr. Craig Staggers mass with surrounding inflammation at multiple margins, no gross residual disease, unclear which is the "positive "radial margin, further surgery and radiation not recommended ? Colonoscopy 11/19/2018-completely obstructing mid ascending colon mass, could not be passed with endoscope, biopsy confirmed invasive adenocarcinoma ? CT abdomen/pelvis 11/18/2018-wall thickening at the mid and distal ascending colon with mild  distention of the proximal ascending colon and cecum ? CTs 10/17/2018--no acute findings, no chest lymphadenopathy, lungs clear ? Cycle 1 FOLFOX 12/28/2018 ? Cycle 2 FOLFOX 01/11/2019, Udenyca added ? Cycle 3 FOLFOX 01/25/2019,Udenyca held due to bone pain ? Cycle 4 FOLFOX 02/21/2019, Udenyca added ? Cycle 5 FOLFOX 03/09/2019, Udenyca ? Cycle 6 FOLFOX 03/21/2019, Udenyca ? Cycle 7 FOLFOX 04/04/2019, Udenyca ? Cycle 8 FOLFOX 04/18/2019, Udenyca ? Cycle 9 FOLFOX 05/02/2019, Udenyca ? Cycle 10 FOLFOX 05/16/2019, oxaliplatin, 5-FU bolus, and Udenyca held ? Cycle 11 FOLFOX 05/30/2019, oxaliplatin, 5-FU bolus, and Udenyca held ? Cycle 12 FOLFOX 06/15/2019, oxaliplatin, 5-FU bolus and Udenyca held ? CTs 09/29/2019-new hypodense enhancing liver masses consistent with metastatic disease, indistinct marginated nodularity below the pancreas head-likely small lymph nodes ? Biopsy liver lesion 10/18/2019-adenocarcinoma consistent with history of colorectal carcinoma ? Cycle 1 FOLFIRI/Avastin 11/01/2019 ? Cycle 2 FOLFIRI/Avastin 11/15/2019 ? Cycle 3 FOLFIRI/Avastin 11/29/2019 ? Cycle 4 FOLFIRI/Avastin 12/13/2019 ? CT chest 12/27/2019-no significant change in hepatic metastases, submassive pulmonary emboli ? Cycle 5 FOLFIRI 01/10/2020 (Avastin discontinued) ? Cycle 6 FOLFIRI 02/01/2020 ? Cycle 7 FOLFIRI 02/13/2020 ? Cycle 8 FOLFIRI 02/27/2020 ? CTs abdomen/pelvis 03/09/2020-per Dr. Gearldine Shown review overall stable disease  2. Deaf 3. Right epididymal cyst removal 03/22/2018 4. Asthma 5. Port-A-Cath placement, [name], 12/23/2018 6. Neutropenia secondary to chemotherapy-Udenyca added for cycle 2 FOLFOX 7. Admission with febrile neutropenia 02/08/2019 8. Hospital admission with submassive PEand left lower extremity DVTon 12/27/2019, heparin, transition to Xarelto 12/28/2019  CT chest 12/27/2019-submassive pulmonary emboli with evidence of right heart strain, no significant change in hepatic metastases  9.  01/24/2020 question perianal abscess/cyst-referred to surgery, treated with antibiotics-improved; possible perianal fistula.  Plan for exam under anesthesia with possible  fistulotomy versus placement of seton.    Disposition: Alexander Reeves appears stable.  He has completed 8 cycles of FOLFIRI.  Recent restaging CTs show overall stable disease.  Dr. Benay Spice reviewed the CT report/images with Alexander Reeves at today's visit and recommends continuation of FOLFIRI chemotherapy.  Alexander Reeves agrees with this plan.  Treatment however is currently on hold due to an upcoming exam under anesthesia for a possible anal fistula.  We will tentatively schedule him to resume treatment in 3 weeks, adjust accordingly pending surgery plan.  Patient seen with Dr. Benay Spice.    Ned Card ANP/GNP-BC   03/14/2020  10:33 AM This was a shared visit with Ned Card.  We reviewed the restaging CT images with Mr. Folkert.  The overall pattern is consistent with stable disease.  The plan is to continue FOLFIRI after he undergoes treatment of the anal fistula.  Bevacizumab will remain on hold.  I was present for greater than 50% of today's visit.  I performed medical decision making.  Julieanne Manson, MD

## 2020-03-15 ENCOUNTER — Telehealth: Payer: Self-pay | Admitting: Oncology

## 2020-03-15 NOTE — Telephone Encounter (Signed)
Scheduled appointments per 3/2 los. Spoke to patient who is aware of appointments dates and times. Mailed updated calendar to patient. Scheduled appointments in April per Dr. Benay Spice.

## 2020-03-30 ENCOUNTER — Encounter (HOSPITAL_BASED_OUTPATIENT_CLINIC_OR_DEPARTMENT_OTHER): Payer: Self-pay | Admitting: General Surgery

## 2020-03-31 ENCOUNTER — Other Ambulatory Visit: Payer: Self-pay | Admitting: Oncology

## 2020-03-31 DIAGNOSIS — Z760 Encounter for issue of repeat prescription: Secondary | ICD-10-CM

## 2020-03-31 DIAGNOSIS — C189 Malignant neoplasm of colon, unspecified: Secondary | ICD-10-CM

## 2020-04-02 ENCOUNTER — Encounter (HOSPITAL_BASED_OUTPATIENT_CLINIC_OR_DEPARTMENT_OTHER): Payer: Self-pay | Admitting: General Surgery

## 2020-04-02 ENCOUNTER — Other Ambulatory Visit (HOSPITAL_COMMUNITY)
Admission: RE | Admit: 2020-04-02 | Discharge: 2020-04-02 | Disposition: A | Discharge status: Home / Self Care | Payer: Medicare Other | Source: Ambulatory Visit | Attending: General Surgery | Admitting: General Surgery

## 2020-04-02 ENCOUNTER — Other Ambulatory Visit (INDEPENDENT_AMBULATORY_CARE_PROVIDER_SITE_OTHER): Payer: Self-pay | Admitting: Primary Care

## 2020-04-02 ENCOUNTER — Other Ambulatory Visit: Payer: Self-pay

## 2020-04-02 DIAGNOSIS — Z20822 Contact with and (suspected) exposure to covid-19: Secondary | ICD-10-CM | POA: Insufficient documentation

## 2020-04-02 DIAGNOSIS — Z01812 Encounter for preprocedural laboratory examination: Secondary | ICD-10-CM | POA: Insufficient documentation

## 2020-04-02 DIAGNOSIS — C189 Malignant neoplasm of colon, unspecified: Secondary | ICD-10-CM

## 2020-04-02 DIAGNOSIS — Z760 Encounter for issue of repeat prescription: Secondary | ICD-10-CM

## 2020-04-02 LAB — SARS CORONAVIRUS 2 (TAT 6-24 HRS): SARS Coronavirus 2: NEGATIVE

## 2020-04-02 MED ORDER — FENOFIBRATE 145 MG PO TABS: 145.0000 mg | ORAL_TABLET | Freq: Every day | ORAL | 0 refills | Status: DC

## 2020-04-02 MED ORDER — ALBUTEROL SULFATE HFA 108 (90 BASE) MCG/ACT IN AERS: 2.0000 | INHALATION_SPRAY | RESPIRATORY_TRACT | 3 refills | Status: DC | PRN

## 2020-04-02 NOTE — Progress Notes (Addendum)
Spoke with Alexander Reeves at [facility], patient is to stay on xarelto per dr Marcello Moores for 04-05-2020 surgery.   Spoke w/ via phone for pre-op interview---pt Lab needs dos----none               Lab results------see below COVID test ------04-02-2020 1315 Arrive at -------815 am 04-05-2020 NPO after MN NO Solid Food.  Clear liquids from MN until---715 am then npo Med rec completed Medications to take morning of surgery -----albuterol nebulizer if gets refilled, albuterol inhaler if gets refilled and bring inhaler, breo ellipta, pantaptazole Diabetic medication ----- Patient instructed to bring photo id and insurance card day of surgery Patient aware to have Driver (ride ) / caregiver   Transportation bring patient, friend to drive pt hone, caregiver after surgery lela downs  for 24 hours after surgery  Patient Special Instructions -----none Pre-Op special Istructions -----none Patient verbalized understanding of instructions that were given at this phone interview. Patient denies shortness of breath, chest pain, fever, cough at this phone interview. Pt states needing inhaler/nebulizer refilled and pt to call prescribing md for refill, patient states being able to run errnads and do household chores without difficulty and can slowly climb stairs  12-28-2019 left le dvt with pe chest ct 12-27-2019 epic Echo 12-28-2019, ekg 12-27-2019 epic  Reviewed patient chart with dr Mateo Flow hacthett mda pt meets [facility] guidelines barring any acute status changes.  Deaf interpreter requested for dos.email on chart

## 2020-04-02 NOTE — Telephone Encounter (Signed)
Copied from [id number]. Topic: Quick Communication - Rx Refill/Question >> Apr 02, 2020  3:05 PM Yvette Rack wrote: Pt stated he contacted pharmacy for refills but he was told to call provider  Medication: fenofibrate (TRICOR) 145 MG tablet and albuterol (VENTOLIN HFA) 108 (90 Base) MCG/ACT inhaler  Has the patient contacted their pharmacy? yes - Pt told to call pcp  Preferred Pharmacy (with phone number or street name): [place] ([place]), [place] - [address] Phone: [phone]   Fax: [phone]  Agent: Please be advised that RX refills may take up to 3 business days. We ask that you follow-up with your pharmacy.

## 2020-04-03 ENCOUNTER — Other Ambulatory Visit: Payer: Medicare Other

## 2020-04-03 ENCOUNTER — Ambulatory Visit: Payer: Medicare Other | Admitting: Nurse Practitioner

## 2020-04-03 ENCOUNTER — Ambulatory Visit: Payer: Medicare Other

## 2020-04-05 ENCOUNTER — Encounter (HOSPITAL_BASED_OUTPATIENT_CLINIC_OR_DEPARTMENT_OTHER)
Admission: RE | Disposition: A | Discharge status: Home / Self Care | Payer: Self-pay | Source: Home / Self Care | Attending: General Surgery

## 2020-04-05 ENCOUNTER — Ambulatory Visit (HOSPITAL_BASED_OUTPATIENT_CLINIC_OR_DEPARTMENT_OTHER): Payer: Medicare Other | Admitting: Anesthesiology

## 2020-04-05 ENCOUNTER — Encounter (HOSPITAL_BASED_OUTPATIENT_CLINIC_OR_DEPARTMENT_OTHER): Payer: Self-pay | Admitting: General Surgery

## 2020-04-05 ENCOUNTER — Ambulatory Visit (HOSPITAL_BASED_OUTPATIENT_CLINIC_OR_DEPARTMENT_OTHER)
Admission: RE | Admit: 2020-04-05 | Discharge: 2020-04-05 | Disposition: A | Discharge status: Home / Self Care | Payer: Medicare Other | Attending: General Surgery | Admitting: General Surgery

## 2020-04-05 DIAGNOSIS — Z7901 Long term (current) use of anticoagulants: Secondary | ICD-10-CM | POA: Insufficient documentation

## 2020-04-05 DIAGNOSIS — Z86711 Personal history of pulmonary embolism: Secondary | ICD-10-CM | POA: Diagnosis not present

## 2020-04-05 DIAGNOSIS — K621 Rectal polyp: Secondary | ICD-10-CM | POA: Diagnosis not present

## 2020-04-05 DIAGNOSIS — K603 Anal fistula: Secondary | ICD-10-CM | POA: Diagnosis not present

## 2020-04-05 DIAGNOSIS — C182 Malignant neoplasm of ascending colon: Secondary | ICD-10-CM | POA: Insufficient documentation

## 2020-04-05 DIAGNOSIS — Z87891 Personal history of nicotine dependence: Secondary | ICD-10-CM | POA: Diagnosis not present

## 2020-04-05 DIAGNOSIS — K219 Gastro-esophageal reflux disease without esophagitis: Secondary | ICD-10-CM | POA: Diagnosis not present

## 2020-04-05 DIAGNOSIS — Z79899 Other long term (current) drug therapy: Secondary | ICD-10-CM | POA: Insufficient documentation

## 2020-04-05 DIAGNOSIS — J4551 Severe persistent asthma with (acute) exacerbation: Secondary | ICD-10-CM | POA: Diagnosis not present

## 2020-04-05 DIAGNOSIS — Z880 Allergy status to penicillin: Secondary | ICD-10-CM | POA: Insufficient documentation

## 2020-04-05 DIAGNOSIS — Z9221 Personal history of antineoplastic chemotherapy: Secondary | ICD-10-CM | POA: Diagnosis not present

## 2020-04-05 DIAGNOSIS — Z86718 Personal history of other venous thrombosis and embolism: Secondary | ICD-10-CM | POA: Diagnosis not present

## 2020-04-05 DIAGNOSIS — Z9049 Acquired absence of other specified parts of digestive tract: Secondary | ICD-10-CM | POA: Diagnosis not present

## 2020-04-05 DIAGNOSIS — K644 Residual hemorrhoidal skin tags: Secondary | ICD-10-CM | POA: Diagnosis not present

## 2020-04-05 HISTORY — DX: Personal history of antineoplastic chemotherapy: Z92.21

## 2020-04-05 HISTORY — DX: Anal fistula: K60.3

## 2020-04-05 HISTORY — DX: Anal fistula, unspecified: K60.30

## 2020-04-05 HISTORY — PX: EVALUATION UNDER ANESTHESIA WITH ANAL FISTULECTOMY: SHX5621

## 2020-04-05 SURGERY — EXAM UNDER ANESTHESIA WITH ANAL FISTULECTOMY
Anesthesia: General | Site: Rectum

## 2020-04-05 MED ORDER — FENTANYL CITRATE (PF) 100 MCG/2ML IJ SOLN
25.0000 ug | INTRAMUSCULAR | Status: DC | PRN
Start: 2020-04-05 — End: 2020-04-05

## 2020-04-05 MED ORDER — PROPOFOL 10 MG/ML IV BOLUS
INTRAVENOUS | Status: DC | PRN
  Administered 2020-04-05: 150 mg via INTRAVENOUS

## 2020-04-05 MED ORDER — SODIUM CHLORIDE 0.9% FLUSH: 3.0000 mL | Freq: Two times a day (BID) | INTRAVENOUS | Status: DC

## 2020-04-05 MED ORDER — ONDANSETRON HCL 4 MG/2ML IJ SOLN
INTRAMUSCULAR | Status: AC
  Filled 2020-04-05: qty 2

## 2020-04-05 MED ORDER — FENTANYL CITRATE (PF) 100 MCG/2ML IJ SOLN
INTRAMUSCULAR | Status: DC | PRN
  Administered 2020-04-05: 50 ug via INTRAVENOUS

## 2020-04-05 MED ORDER — BUPIVACAINE-EPINEPHRINE 0.5% -1:200000 IJ SOLN
INTRAMUSCULAR | Status: DC | PRN
  Administered 2020-04-05: 40 mL

## 2020-04-05 MED ORDER — PROMETHAZINE HCL 25 MG/ML IJ SOLN: 6.2500 mg | INTRAMUSCULAR | Status: DC | PRN

## 2020-04-05 MED ORDER — PROPOFOL 10 MG/ML IV BOLUS
INTRAVENOUS | Status: AC
  Filled 2020-04-05: qty 20

## 2020-04-05 MED ORDER — ACETAMINOPHEN 500 MG PO TABS
1000.0000 mg | ORAL_TABLET | ORAL | Status: AC
  Administered 2020-04-05: 1000 mg via ORAL

## 2020-04-05 MED ORDER — ROCURONIUM BROMIDE 100 MG/10ML IV SOLN
INTRAVENOUS | Status: DC | PRN
  Administered 2020-04-05: 40 mg via INTRAVENOUS

## 2020-04-05 MED ORDER — MIDAZOLAM HCL 2 MG/2ML IJ SOLN
INTRAMUSCULAR | Status: AC
  Filled 2020-04-05: qty 2

## 2020-04-05 MED ORDER — ACETAMINOPHEN 500 MG PO TABS
ORAL_TABLET | ORAL | Status: AC
  Filled 2020-04-05: qty 2

## 2020-04-05 MED ORDER — ONDANSETRON HCL 4 MG/2ML IJ SOLN
INTRAMUSCULAR | Status: DC | PRN
  Administered 2020-04-05: 4 mg via INTRAVENOUS

## 2020-04-05 MED ORDER — LIDOCAINE HCL (CARDIAC) PF 100 MG/5ML IV SOSY
PREFILLED_SYRINGE | INTRAVENOUS | Status: DC | PRN
  Administered 2020-04-05: 80 mg via INTRAVENOUS

## 2020-04-05 MED ORDER — LACTATED RINGERS IV SOLN: INTRAVENOUS | Status: DC

## 2020-04-05 MED ORDER — LIDOCAINE 2% (20 MG/ML) 5 ML SYRINGE
INTRAMUSCULAR | Status: AC
  Filled 2020-04-05: qty 5

## 2020-04-05 MED ORDER — OXYCODONE HCL 5 MG/5ML PO SOLN: 5.0000 mg | Freq: Once | ORAL | Status: DC | PRN

## 2020-04-05 MED ORDER — DEXAMETHASONE SODIUM PHOSPHATE 4 MG/ML IJ SOLN
INTRAMUSCULAR | Status: DC | PRN
  Administered 2020-04-05: 8 mg via INTRAVENOUS

## 2020-04-05 MED ORDER — ROCURONIUM BROMIDE 10 MG/ML (PF) SYRINGE
PREFILLED_SYRINGE | INTRAVENOUS | Status: AC
  Filled 2020-04-05: qty 10

## 2020-04-05 MED ORDER — MIDAZOLAM HCL 2 MG/2ML IJ SOLN
INTRAMUSCULAR | Status: DC | PRN
  Administered 2020-04-05: 1 mg via INTRAVENOUS

## 2020-04-05 MED ORDER — 0.9 % SODIUM CHLORIDE (POUR BTL) OPTIME
TOPICAL | Status: DC | PRN
  Administered 2020-04-05: 500 mL

## 2020-04-05 MED ORDER — TRAMADOL HCL 50 MG PO TABS: 50.0000 mg | ORAL_TABLET | Freq: Four times a day (QID) | ORAL | 0 refills | Status: AC | PRN

## 2020-04-05 MED ORDER — OXYCODONE HCL 5 MG PO TABS: 5.0000 mg | ORAL_TABLET | Freq: Once | ORAL | Status: DC | PRN

## 2020-04-05 MED ORDER — FENTANYL CITRATE (PF) 100 MCG/2ML IJ SOLN
INTRAMUSCULAR | Status: AC
  Filled 2020-04-05: qty 2

## 2020-04-05 MED ORDER — SUGAMMADEX SODIUM 200 MG/2ML IV SOLN
INTRAVENOUS | Status: DC | PRN
  Administered 2020-04-05: 200 mg via INTRAVENOUS

## 2020-04-05 MED ORDER — DEXAMETHASONE SODIUM PHOSPHATE 10 MG/ML IJ SOLN
INTRAMUSCULAR | Status: AC
  Filled 2020-04-05: qty 1

## 2020-04-05 SURGICAL SUPPLY — 48 items
BENZOIN TINCTURE PRP APPL 2/3 (GAUZE/BANDAGES/DRESSINGS) ×2 IMPLANT
BLADE EXTENDED COATED 6.5IN (ELECTRODE) IMPLANT
BLADE HEX COATED 2.75 (ELECTRODE) IMPLANT
BLADE SURG 10 STRL SS (BLADE) IMPLANT
BRIEF STRETCH FOR OB PAD LRG (UNDERPADS AND DIAPERS) ×2 IMPLANT
CANISTER SUCT 3000ML PPV (MISCELLANEOUS) IMPLANT
COVER BACK TABLE 60X90IN (DRAPES) ×2 IMPLANT
COVER MAYO STAND STRL (DRAPES) ×2 IMPLANT
COVER WAND RF STERILE (DRAPES) IMPLANT
DECANTER SPIKE VIAL GLASS SM (MISCELLANEOUS) ×2 IMPLANT
DRAPE LAPAROTOMY 100X72 PEDS (DRAPES) ×2 IMPLANT
DRAPE UTILITY XL STRL (DRAPES) ×2 IMPLANT
DRSG PAD ABDOMINAL 8X10 ST (GAUZE/BANDAGES/DRESSINGS) ×2 IMPLANT
ELECT REM PT RETURN 9FT ADLT (ELECTROSURGICAL) ×2
ELECTRODE REM PT RTRN 9FT ADLT (ELECTROSURGICAL) ×1 IMPLANT
GAUZE SPONGE 4X4 12PLY STRL (GAUZE/BANDAGES/DRESSINGS) ×2 IMPLANT
GAUZE SPONGE 4X4 12PLY STRL LF (GAUZE/BANDAGES/DRESSINGS) ×2 IMPLANT
GLOVE SURG ENC MOIS LTX SZ6.5 (GLOVE) ×2 IMPLANT
GLOVE SURG UNDER POLY LF SZ7 (GLOVE) ×2 IMPLANT
GOWN STRL REUS W/TWL XL LVL3 (GOWN DISPOSABLE) ×2 IMPLANT
HYDROGEN PEROXIDE 16OZ (MISCELLANEOUS) IMPLANT
IV CATH 14GX2 1/4 (CATHETERS) IMPLANT
IV CATH 18G SAFETY (IV SOLUTION) IMPLANT
KIT SIGMOIDOSCOPE (SET/KITS/TRAYS/PACK) IMPLANT
KIT TURNOVER CYSTO (KITS) ×2 IMPLANT
LOOP VESSEL MAXI BLUE (MISCELLANEOUS) IMPLANT
NEEDLE HYPO 22GX1.5 SAFETY (NEEDLE) ×2 IMPLANT
NS IRRIG 500ML POUR BTL (IV SOLUTION) ×2 IMPLANT
PACK BASIN DAY SURGERY FS (CUSTOM PROCEDURE TRAY) ×2 IMPLANT
PAD ARMBOARD 7.5X6 YLW CONV (MISCELLANEOUS) ×4 IMPLANT
PENCIL SMOKE EVACUATOR (MISCELLANEOUS) ×2 IMPLANT
SPONGE HEMORRHOID 8X3CM (HEMOSTASIS) IMPLANT
SPONGE SURGIFOAM ABS GEL 12-7 (HEMOSTASIS) IMPLANT
SUCTION FRAZIER HANDLE 10FR (MISCELLANEOUS)
SUCTION TUBE FRAZIER 10FR DISP (MISCELLANEOUS) IMPLANT
SUT CHROMIC 2 0 SH (SUTURE) ×2 IMPLANT
SUT CHROMIC 3 0 SH 27 (SUTURE) IMPLANT
SUT ETHIBOND 0 (SUTURE) IMPLANT
SUT VIC AB 2-0 SH 27 (SUTURE)
SUT VIC AB 2-0 SH 27XBRD (SUTURE) IMPLANT
SUT VIC AB 3-0 SH 18 (SUTURE) IMPLANT
SUT VIC AB 3-0 SH 27 (SUTURE)
SUT VIC AB 3-0 SH 27XBRD (SUTURE) IMPLANT
SYR CONTROL 10ML LL (SYRINGE) ×2 IMPLANT
TOWEL OR 17X26 10 PK STRL BLUE (TOWEL DISPOSABLE) ×2 IMPLANT
TRAY DSU PREP LF (CUSTOM PROCEDURE TRAY) ×2 IMPLANT
TUBE CONNECTING 12X1/4 (SUCTIONS) ×2 IMPLANT
YANKAUER SUCT BULB TIP NO VENT (SUCTIONS) ×2 IMPLANT

## 2020-04-05 NOTE — Op Note (Signed)
04/05/2020  10:16 AM  PATIENT:  Alexander Pelt Sr.  55 y.o. male  Patient Care Team: [name], NP as PCP - General (Internal Medicine)  PRE-OPERATIVE DIAGNOSIS:  ANAL FIATULA  POST-OPERATIVE DIAGNOSIS:  Anal fistula, Anal papilla  PROCEDURE:  EXAM UNDER ANESTHESIA WITH ANAL FISTULOTOMY     Surgeon(s): Leighton Ruff, MD  ASSISTANT: none   ANESTHESIA:   local and general  SPECIMEN:  Source of Specimen:  anal papilla  DISPOSITION OF SPECIMEN:  PATHOLOGY  COUNTS:  YES  PLAN OF CARE: Discharge to home after PACU  PATIENT DISPOSITION:  PACU - hemodynamically stable.  INDICATION: 55 year old male with perianal pain and drainage consistent with anal fistula.   OR FINDINGS: Left posterior anal fistula involving the external superficial sphincter complex only  DESCRIPTION: the patient was identified in the preoperative holding area and taken to the OR where they were laid on the operating room table.  General anesthesia was induced without difficulty. The patient was then positioned in prone jackknife position with buttocks gently taped apart.  The patient was then prepped and draped in usual sterile fashion.  SCDs were noted to be in place prior to the initiation of anesthesia. A surgical timeout was performed indicating the correct patient, procedure, positioning and need for preoperative antibiotics.  A rectal block was performed using Marcaine with epinephrine.    I began with a digital rectal exam.  There was a mass palpated in the right lateral anal canal.  I then placed a Hill-Ferguson anoscope into the anal canal and evaluated this completely.  This appeared to be an anal papilla.  It looked abnormal.  I excised this using electrocautery and sent it to pathology for further examination.  I then turned my attention to the patient's fistula.  An S shaped fistula probe was placed into the external opening and this came out through the anal canal very distally.  There  appeared to be only a small amount of external sphincter complex involved.  A fistulotomy was performed.  The edges were then marsupialized using interrupted 2-0 chromic sutures.  Additional Marcaine was applied to the incision sites.  The patient tolerated this well.  He was awakened from anesthesia and sent to the postanesthesia care unit in stable condition.  All counts were correct per operating room staff.

## 2020-04-05 NOTE — Transfer of Care (Signed)
Immediate Anesthesia Transfer of Care Note  Patient: Alexander LASCOLA Sr.  Procedure(s) Performed: [name] UNDER ANESTHESIA WITH ANAL FISTULOTOMY AND EXCISION OF ANAL PAPILLA (N/A Rectum)  Patient Location: PACU  Anesthesia Type:General  Level of Consciousness: drowsy and patient cooperative  Airway & Oxygen Therapy: Patient Spontanous Breathing and Patient connected to nasal cannula oxygen  Post-op Assessment: Report given to RN and Post -op Vital signs reviewed and stable  Post vital signs: Reviewed and stable  Last Vitals:  Vitals Value Taken Time  BP    Temp    Pulse 96 04/05/20 1037  Resp 17 04/05/20 1037  SpO2 100 % 04/05/20 1037  Vitals shown include unvalidated device data.  Last Pain:  Vitals:   04/05/20 0858  TempSrc: Oral  PainSc: 0-No pain      Patients Stated Pain Goal: 3 (07/08/92 8546)  Complications: No complications documented.

## 2020-04-05 NOTE — Anesthesia Procedure Notes (Addendum)
Procedure Name: Intubation Date/Time: 04/05/2020 9:51 AM Performed by: Georgeanne Nim, CRNA Pre-anesthesia Checklist: Patient identified, Emergency Drugs available, Suction available, Patient being monitored and Timeout performed Patient Re-evaluated:Patient Re-evaluated prior to induction Oxygen Delivery Method: Circle system utilized Preoxygenation: Pre-oxygenation with 100% oxygen Induction Type: IV induction Ventilation: Mask ventilation without difficulty Laryngoscope Size: Mac and 4 Grade View: Grade III Tube size: 7.5 mm Number of attempts: 1 Airway Equipment and Method: Stylet Placement Confirmation: positive ETCO2,  CO2 detector and breath sounds checked- equal and bilateral Secured at: 22 cm Tube secured with: Tape Dental Injury: Teeth and Oropharynx as per pre-operative assessment  Comments: Large epiglottis, base of arytenoids only visualized

## 2020-04-05 NOTE — Addendum Note (Signed)
Addendum  created 04/05/20 1317 by Georgeanne Nim, CRNA   Flowsheet accepted, Intraprocedure Flowsheets edited

## 2020-04-05 NOTE — Anesthesia Preprocedure Evaluation (Addendum)
Anesthesia Evaluation  Patient identified by MRN, date of birth, ID band Patient awake    Reviewed: Allergy & Precautions, NPO status , Patient's Chart, lab work & pertinent test results  History of Anesthesia Complications Negative for: history of anesthetic complications  Airway Mallampati: II  TM Distance: >3 FB Neck ROM: Full    Dental no notable dental hx.    Pulmonary asthma , former smoker, PE (submassive PE and left lower extremity DVT on 12/27/2019, on Xarelto)   Pulmonary exam normal        Cardiovascular negative cardio ROS Normal cardiovascular exam     Neuro/Psych negative neurological ROS  negative psych ROS   GI/Hepatic Neg liver ROS, GERD  Medicated and Controlled,Anal fistula   Endo/Other  negative endocrine ROS  Renal/GU negative Renal ROS  negative genitourinary   Musculoskeletal  (+) Arthritis ,   Abdominal   Peds  Hematology negative hematology ROS (+)   Anesthesia Other Findings Day of surgery medications reviewed with patient.  Reproductive/Obstetrics negative OB ROS                           Anesthesia Physical Anesthesia Plan  ASA: II  Anesthesia Plan: General   Post-op Pain Management:    Induction: Intravenous  PONV Risk Score and Plan: 2 and Treatment may vary due to age or medical condition, Midazolam, Ondansetron and Dexamethasone  Airway Management Planned: Oral ETT  Additional Equipment: None  Intra-op Plan:   Post-operative Plan: Extubation in OR  Informed Consent: I have reviewed the patients History and Physical, chart, labs and discussed the procedure including the risks, benefits and alternatives for the proposed anesthesia with the patient or authorized representative who has indicated his/her understanding and acceptance.     Dental advisory given and Interpreter used for interveiw  Plan Discussed with: CRNA  Anesthesia Plan  Comments: (Sign language interpreter used for interview/consent. Daiva Huge, MD)       Anesthesia Quick Evaluation

## 2020-04-05 NOTE — Interval H&P Note (Signed)
History and Physical Interval Note:  04/05/2020 8:21 AM  Alexander Pelt Sr.  has presented today for surgery, with the diagnosis of [place].  The various methods of treatment have been discussed with the patient and family. After consideration of risks, benefits and other options for treatment, the patient has consented to  Procedure(s): EXAM UNDER ANESTHESIA WITH ANAL FISTULECTOMY VS SETON (N/A) as a surgical intervention.  The patient's history has been reviewed, patient examined, no change in status, stable for surgery.  I have reviewed the patient's chart and labs.  Questions were answered to the patient's satisfaction.     Rosario Adie, MD  [facility]

## 2020-04-05 NOTE — Anesthesia Postprocedure Evaluation (Signed)
Anesthesia Post Note  Patient: Alexander ORLICK Sr.  Procedure(s) Performed: EXAM UNDER ANESTHESIA WITH ANAL FISTULOTOMY AND EXCISION OF ANAL PAPILLA (N/A Rectum)     Patient location during evaluation: PACU Anesthesia Type: General Level of consciousness: awake and alert and oriented Pain management: pain level controlled Vital Signs Assessment: post-procedure vital signs reviewed and stable Respiratory status: spontaneous breathing, nonlabored ventilation and respiratory function stable Cardiovascular status: blood pressure returned to baseline Postop Assessment: no apparent nausea or vomiting Anesthetic complications: no   No complications documented.  Last Vitals:  Vitals:   04/05/20 1100 04/05/20 1115  BP: 123/86 130/85  Pulse: 86 83  Resp: 14 18  Temp:  (!) 36.4 C  SpO2: 100% 100%    Last Pain:  Vitals:   04/05/20 1115  TempSrc:   PainSc: 0-No pain                 Brennan Bailey

## 2020-04-05 NOTE — Discharge Instructions (Addendum)
Beginning the day after surgery:  You may sit in a tub of warm water 2-3 times a day to relieve discomfort.  Eat a regular diet high in fiber.  Avoid foods that give you constipation or diarrhea.  Avoid foods that are difficult to digest, such as seeds, nuts, corn or popcorn.  Do not go any longer than 2 days without a bowel movement.  You may take a dose of Milk of Magnesia if you become constipated.    Drink 6-8 glasses of water daily.  Walking is encouraged.  Avoid strenuous activity and heavy lifting for one month after surgery.    Place a dry dressing over your wound and change daily.  Soak in a tub of warm water after bowel movements.  Keep area clean with soap and water  Call the office if you have any questions or concerns.  Call immediately if you develop:   Excessive rectal bleeding (more than a cup or passing large clots)  Increased discomfort  Fever greater than 100 F  Difficulty urinating    Post Anesthesia Home Care Instructions  Activity: Get plenty of rest for the remainder of the day. A responsible individual must stay with you for 24 hours following the procedure.  For the next 24 hours, DO NOT: -Drive a car -Paediatric nurse -Drink alcoholic beverages -Take any medication unless instructed by your physician -Make any legal decisions or sign important papers.  Meals: Start with liquid foods such as gelatin or soup. Progress to regular foods as tolerated. Avoid greasy, spicy, heavy foods. If nausea and/or vomiting occur, drink only clear liquids until the nausea and/or vomiting subsides. Call your physician if vomiting continues.  Special Instructions/Symptoms: Your throat may feel dry or sore from the anesthesia or the breathing tube placed in your throat during surgery. If this causes discomfort, gargle with warm salt water. The discomfort should disappear within 24 hours.

## 2020-04-05 NOTE — Progress Notes (Signed)
Instructions given with interpreter

## 2020-04-06 ENCOUNTER — Encounter (HOSPITAL_BASED_OUTPATIENT_CLINIC_OR_DEPARTMENT_OTHER): Payer: Self-pay | Admitting: General Surgery

## 2020-04-08 LAB — SURGICAL PATHOLOGY

## 2020-04-09 ENCOUNTER — Ambulatory Visit (INDEPENDENT_AMBULATORY_CARE_PROVIDER_SITE_OTHER): Payer: Medicare Other | Admitting: Dermatology

## 2020-04-09 ENCOUNTER — Encounter: Payer: Self-pay | Admitting: Dermatology

## 2020-04-09 ENCOUNTER — Other Ambulatory Visit: Payer: Self-pay

## 2020-04-09 DIAGNOSIS — L814 Other melanin hyperpigmentation: Secondary | ICD-10-CM

## 2020-04-11 ENCOUNTER — Telehealth: Payer: Self-pay | Admitting: Oncology

## 2020-04-11 NOTE — Telephone Encounter (Signed)
Called pt to confirm appt on 4/4 - pt is aware of appt on 4/4

## 2020-04-12 ENCOUNTER — Other Ambulatory Visit: Payer: Self-pay | Admitting: Nurse Practitioner

## 2020-04-14 ENCOUNTER — Other Ambulatory Visit: Payer: Self-pay | Admitting: Oncology

## 2020-04-16 ENCOUNTER — Other Ambulatory Visit: Payer: Self-pay

## 2020-04-16 ENCOUNTER — Inpatient Hospital Stay: Payer: Medicare Other

## 2020-04-16 ENCOUNTER — Inpatient Hospital Stay: Payer: Medicare Other | Attending: Nurse Practitioner

## 2020-04-16 ENCOUNTER — Inpatient Hospital Stay (HOSPITAL_BASED_OUTPATIENT_CLINIC_OR_DEPARTMENT_OTHER): Payer: Medicare Other | Admitting: Oncology

## 2020-04-16 VITALS — BP 134/86 | HR 77 | Temp 98.1°F | Resp 18 | Ht 70.0 in | Wt 153.4 lb

## 2020-04-16 VITALS — BP 134/82 | HR 79 | Temp 97.8°F | Resp 18

## 2020-04-16 DIAGNOSIS — C787 Secondary malignant neoplasm of liver and intrahepatic bile duct: Secondary | ICD-10-CM | POA: Insufficient documentation

## 2020-04-16 DIAGNOSIS — Z5111 Encounter for antineoplastic chemotherapy: Secondary | ICD-10-CM | POA: Insufficient documentation

## 2020-04-16 DIAGNOSIS — Z760 Encounter for issue of repeat prescription: Secondary | ICD-10-CM

## 2020-04-16 DIAGNOSIS — D701 Agranulocytosis secondary to cancer chemotherapy: Secondary | ICD-10-CM | POA: Insufficient documentation

## 2020-04-16 DIAGNOSIS — Z86711 Personal history of pulmonary embolism: Secondary | ICD-10-CM | POA: Insufficient documentation

## 2020-04-16 DIAGNOSIS — Z9049 Acquired absence of other specified parts of digestive tract: Secondary | ICD-10-CM | POA: Diagnosis not present

## 2020-04-16 DIAGNOSIS — Z7901 Long term (current) use of anticoagulants: Secondary | ICD-10-CM | POA: Diagnosis not present

## 2020-04-16 DIAGNOSIS — C189 Malignant neoplasm of colon, unspecified: Secondary | ICD-10-CM

## 2020-04-16 DIAGNOSIS — Z86718 Personal history of other venous thrombosis and embolism: Secondary | ICD-10-CM | POA: Insufficient documentation

## 2020-04-16 DIAGNOSIS — C182 Malignant neoplasm of ascending colon: Secondary | ICD-10-CM

## 2020-04-16 DIAGNOSIS — M79622 Pain in left upper arm: Secondary | ICD-10-CM | POA: Diagnosis not present

## 2020-04-16 DIAGNOSIS — C779 Secondary and unspecified malignant neoplasm of lymph node, unspecified: Secondary | ICD-10-CM | POA: Insufficient documentation

## 2020-04-16 LAB — CMP (CANCER CENTER ONLY)
ALT: 15 U/L (ref 0–44)
AST: 16 U/L (ref 15–41)
Albumin: 4.2 g/dL (ref 3.5–5.0)
Alkaline Phosphatase: 40 U/L (ref 38–126)
Anion gap: 8 (ref 5–15)
BUN: 13 mg/dL (ref 6–20)
CO2: 27 mmol/L (ref 22–32)
Calcium: 9 mg/dL (ref 8.9–10.3)
Chloride: 104 mmol/L (ref 98–111)
Creatinine: 1.12 mg/dL (ref 0.61–1.24)
GFR, Estimated: >60 mL/min (ref >60)
Glucose, Bld: 105 mg/dL — ABNORMAL HIGH (ref 70–99)
Potassium: 4.1 mmol/L (ref 3.5–5.1)
Sodium: 139 mmol/L (ref 135–145)
Total Bilirubin: 0.5 mg/dL (ref 0.3–1.2)
Total Protein: 6.9 g/dL (ref 6.5–8.1)

## 2020-04-16 LAB — CBC WITH DIFFERENTIAL (CANCER CENTER ONLY)
Abs Immature Granulocytes: 0.01 10*3/uL (ref 0.00–0.07)
Basophils Absolute: 0 10*3/uL (ref 0.0–0.1)
Basophils Relative: 1 %
Eosinophils Absolute: 0.5 10*3/uL (ref 0.0–0.5)
Eosinophils Relative: 12 %
HCT: 35.6 % — ABNORMAL LOW (ref 39.0–52.0)
Hemoglobin: 10.8 g/dL — ABNORMAL LOW (ref 13.0–17.0)
Immature Granulocytes: 0 %
Lymphocytes Relative: 29 %
Lymphs Abs: 1.3 10*3/uL (ref 0.7–4.0)
MCH: 24.8 pg — ABNORMAL LOW (ref 26.0–34.0)
MCHC: 30.3 g/dL (ref 30.0–36.0)
MCV: 81.7 fL (ref 80.0–100.0)
Monocytes Absolute: 0.4 10*3/uL (ref 0.1–1.0)
Monocytes Relative: 10 %
Neutro Abs: 2 10*3/uL (ref 1.7–7.7)
Neutrophils Relative %: 48 %
Platelet Count: 149 10*3/uL — ABNORMAL LOW (ref 150–400)
RBC: 4.36 MIL/uL (ref 4.22–5.81)
RDW: 18.1 % — ABNORMAL HIGH (ref 11.5–15.5)
WBC Count: 4.3 10*3/uL (ref 4.0–10.5)
nRBC: 0 % (ref 0.0–0.2)

## 2020-04-16 MED ORDER — HEPARIN SOD (PORK) LOCK FLUSH 100 UNIT/ML IV SOLN
250.0000 [IU] | Freq: Once | INTRAVENOUS | Status: DC | PRN
  Filled 2020-04-16: qty 5

## 2020-04-16 MED ORDER — PALONOSETRON HCL INJECTION 0.25 MG/5ML
0.2500 mg | Freq: Once | INTRAVENOUS | Status: AC
  Administered 2020-04-16: 0.25 mg via INTRAVENOUS
  Filled 2020-04-16: qty 5

## 2020-04-16 MED ORDER — HEPARIN SOD (PORK) LOCK FLUSH 100 UNIT/ML IV SOLN
500.0000 [IU] | Freq: Once | INTRAVENOUS | Status: DC | PRN
  Filled 2020-04-16: qty 5

## 2020-04-16 MED ORDER — PANTOPRAZOLE SODIUM 40 MG PO TBEC: 40.0000 mg | DELAYED_RELEASE_TABLET | Freq: Every day | ORAL | 5 refills | Status: DC

## 2020-04-16 MED ORDER — MONTELUKAST SODIUM 10 MG PO TABS: 10.0000 mg | ORAL_TABLET | Freq: Every day | ORAL | 1 refills | Status: DC

## 2020-04-16 MED ORDER — SODIUM CHLORIDE 0.9 % IV SOLN
2400.0000 mg/m2 | INTRAVENOUS | Status: DC
  Administered 2020-04-16: 4300 mg via INTRAVENOUS
  Filled 2020-04-16: qty 86

## 2020-04-16 MED ORDER — SODIUM CHLORIDE 0.9 % IV SOLN
10.0000 mg | Freq: Once | INTRAVENOUS | Status: AC
  Administered 2020-04-16: 10 mg via INTRAVENOUS
  Filled 2020-04-16: qty 10

## 2020-04-16 MED ORDER — SODIUM CHLORIDE 0.9% FLUSH
10.0000 mL | INTRAVENOUS | Status: DC | PRN
  Filled 2020-04-16: qty 10

## 2020-04-16 MED ORDER — SODIUM CHLORIDE 0.9 % IV SOLN
180.0000 mg/m2 | Freq: Once | INTRAVENOUS | Status: AC
  Administered 2020-04-16: 320 mg via INTRAVENOUS
  Filled 2020-04-16: qty 15

## 2020-04-16 MED ORDER — FLUOROURACIL CHEMO INJECTION 2.5 GM/50ML
400.0000 mg/m2 | Freq: Once | INTRAVENOUS | Status: AC
  Administered 2020-04-16: 700 mg via INTRAVENOUS
  Filled 2020-04-16: qty 14

## 2020-04-16 MED ORDER — SODIUM CHLORIDE 0.9 % IV SOLN
Freq: Once | INTRAVENOUS | Status: AC
  Filled 2020-04-16: qty 250

## 2020-04-16 MED ORDER — SODIUM CHLORIDE 0.9 % IV SOLN
400.0000 mg/m2 | Freq: Once | INTRAVENOUS | Status: AC
  Administered 2020-04-16: 716 mg via INTRAVENOUS
  Filled 2020-04-16: qty 35.8

## 2020-04-16 NOTE — Patient Instructions (Signed)
[facility] Discharge Instructions for Patients receiving Home Portable Chemo Pump    **The bag should finish at 46 hours (12:00 Wednesday 4/6).     ** if the display on your pump reads "Low Volume" and it is beeping, take the batteries out of the pump and come to the cancer center for it to be taken off.   **If the pump alarms go off prior to the pump reading "Low Volume" then call the [phone] and someone can assist you.  **If the plunger comes out and the bag fluid is running out, please use your chemo spill kit to clean up the spill. Do not use paper towels or other house hold products.  ** If you have problems or questions regarding your pump, please call either the [phone] or the cancer center Monday-Friday 8:00am-4:30pm at [phone]  and we will assist you.  If you are unable to get assistance then go to [facility] Emergency Room, ask the staff to contact the IV team for assistance.    Please take your nausea meds as needed for nausea/vomiting and call the [facility] if it is unresolved.

## 2020-04-16 NOTE — Progress Notes (Signed)
[place] OFFICE PROGRESS NOTE   Diagnosis: Colon cancer  INTERVAL HISTORY:   Alexander Reeves returns as scheduled.  He underwent an exam under anesthesia with an anal fistulotomy.  An anal papilla was excised.  The pathology revealed a fibroepithelial polyp. No bleeding.  No complaint.  The rectum feels better.  Objective:  Vital signs in last 24 hours:  Blood pressure 134/86, pulse 77, temperature 98.1 F (36.7 C), temperature source Tympanic, resp. rate 18, height '5\' 10"'  (1.778 m), weight 153 lb 6.4 oz (69.6 kg), SpO2 100 %.    HEENT: Hyperpigmentation of the buccal mucosa, no thrush or ulcers Resp: Lungs clear bilaterally Cardio: Regular rate and rhythm GI: Nontender, no hepatosplenomegaly Vascular: No leg edema  Skin: Mild hyperpigmentation of the hands  Portacath/PICC-without erythema  Lab Results:  Lab Results  Component Value Date   WBC 4.3 04/16/2020   HGB 10.8 (L) 04/16/2020   HCT 35.6 (L) 04/16/2020   MCV 81.7 04/16/2020   PLT 149 (L) 04/16/2020   NEUTROABS 2.0 04/16/2020    CMP  Lab Results  Component Value Date   NA 139 04/16/2020   K 4.1 04/16/2020   CL 104 04/16/2020   CO2 27 04/16/2020   GLUCOSE 105 (H) 04/16/2020   BUN 13 04/16/2020   CREATININE 1.12 04/16/2020   CALCIUM 9.0 04/16/2020   PROT 6.9 04/16/2020   ALBUMIN 4.2 04/16/2020   AST 16 04/16/2020   ALT 15 04/16/2020   ALKPHOS 40 04/16/2020   BILITOT 0.5 04/16/2020   GFRNONAA >60 04/16/2020   GFRAA >60 09/28/2019    Lab Results  Component Value Date   CEA1 1.75 09/28/2019     Medications: I have reviewed the patient's current medications.   Assessment/Plan: 1.Moderately differentiated adenocarcinoma ascending colon, stage IIIb (pT3pN1), status post a right colectomy 11/19/2018 ? Lymphovascular and perineural invasion present, 2/14 lymph nodes positive, tumor deposits present ? Positive radial margin, no loss of mismatch repair protein expression, discussed  case with Dr. Craig Staggers mass with surrounding inflammation at multiple margins, no gross residual disease, unclear which is the "positive "radial margin, further surgery and radiation not recommended ? Colonoscopy 11/19/2018-completely obstructing mid ascending colon mass, could not be passed with endoscope, biopsy confirmed invasive adenocarcinoma ? CT abdomen/pelvis 11/18/2018-wall thickening at the mid and distal ascending colon with mild distention of the proximal ascending colon and cecum ? CTs 10/17/2018--no acute findings, no chest lymphadenopathy, lungs clear ? Cycle 1 FOLFOX 12/28/2018 ? Cycle 2 FOLFOX 01/11/2019, Udenyca added ? Cycle 3 FOLFOX 01/25/2019,Udenyca held due to bone pain ? Cycle 4 FOLFOX 02/21/2019, Udenyca added ? Cycle 5 FOLFOX 03/09/2019, Udenyca ? Cycle 6 FOLFOX 03/21/2019, Udenyca ? Cycle 7 FOLFOX 04/04/2019, Udenyca ? Cycle 8 FOLFOX 04/18/2019, Udenyca ? Cycle 9 FOLFOX 05/02/2019, Udenyca ? Cycle 10 FOLFOX 05/16/2019, oxaliplatin, 5-FU bolus, and Udenyca held ? Cycle 11 FOLFOX 05/30/2019, oxaliplatin, 5-FU bolus, and Udenyca held ? Cycle 12 FOLFOX 06/15/2019, oxaliplatin, 5-FU bolus and Udenyca held ? CTs 09/29/2019-new hypodense enhancing liver masses consistent with metastatic disease, indistinct marginated nodularity below the pancreas head-likely small lymph nodes ? Biopsy liver lesion 10/18/2019-adenocarcinoma consistent with history of colorectal carcinoma ? Cycle 1 FOLFIRI/Avastin 11/01/2019 ? Cycle 2 FOLFIRI/Avastin 11/15/2019 ? Cycle 3 FOLFIRI/Avastin 11/29/2019 ? Cycle 4 FOLFIRI/Avastin 12/13/2019 ? CT chest 12/27/2019-no significant change in hepatic metastases, submassive pulmonary emboli ? Cycle 5 FOLFIRI 01/10/2020 (Avastin discontinued) ? Cycle 6 FOLFIRI 02/01/2020 ? Cycle 7 FOLFIRI 02/13/2020 ? Cycle 8 FOLFIRI 02/27/2020 ? CTs abdomen/pelvis  03/09/2020-per Dr. Gearldine Shown review overall stable disease ? Cycle 9 FOLFIRI 04/16/2020  2. Deaf 3. Right  epididymal cyst removal 03/22/2018 4. Asthma 5. Port-A-Cath placement, [name], 12/23/2018 6. Neutropenia secondary to chemotherapy-Udenyca added for cycle 2 FOLFOX 7. Admission with febrile neutropenia 02/08/2019 8. Hospital admission with submassive PEand left lower extremity DVTon 12/27/2019, heparin, transition to Xarelto 12/28/2019  CT chest 12/27/2019-submassive pulmonary emboli with evidence of right heart strain, no significant change in hepatic metastases  9. 01/24/2020 question perianal abscess/cyst-referred to surgery, treated with antibiotics-improved; possible perianal fistula.  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  He has not received chemotherapy for the past 6 weeks secondary to development of an anal fistula and need for surgery.  The plan is to resume FOLFIRI today.  He will complete 2-3 more cycles of FOLFIRI prior to a restaging CT evaluation.  Alexander Reeves will return for an office visit and chemotherapy in 2 weeks.  Betsy Coder, MD  04/16/2020  9:16 AM

## 2020-04-17 ENCOUNTER — Telehealth: Payer: Self-pay | Admitting: Oncology

## 2020-04-17 NOTE — Telephone Encounter (Signed)
Appointments scheduled calendar printed per 4/4 los

## 2020-04-18 ENCOUNTER — Other Ambulatory Visit: Payer: Self-pay

## 2020-04-18 ENCOUNTER — Inpatient Hospital Stay: Payer: Medicare Other

## 2020-04-18 VITALS — BP 110/76 | HR 94 | Temp 97.8°F | Resp 18

## 2020-04-18 DIAGNOSIS — Z9049 Acquired absence of other specified parts of digestive tract: Secondary | ICD-10-CM | POA: Diagnosis not present

## 2020-04-18 DIAGNOSIS — C182 Malignant neoplasm of ascending colon: Secondary | ICD-10-CM

## 2020-04-18 DIAGNOSIS — C189 Malignant neoplasm of colon, unspecified: Secondary | ICD-10-CM

## 2020-04-18 DIAGNOSIS — Z5111 Encounter for antineoplastic chemotherapy: Secondary | ICD-10-CM | POA: Diagnosis not present

## 2020-04-18 DIAGNOSIS — C787 Secondary malignant neoplasm of liver and intrahepatic bile duct: Secondary | ICD-10-CM | POA: Diagnosis not present

## 2020-04-18 DIAGNOSIS — D701 Agranulocytosis secondary to cancer chemotherapy: Secondary | ICD-10-CM | POA: Diagnosis not present

## 2020-04-18 DIAGNOSIS — M79622 Pain in left upper arm: Secondary | ICD-10-CM | POA: Diagnosis not present

## 2020-04-18 MED ORDER — HEPARIN SOD (PORK) LOCK FLUSH 100 UNIT/ML IV SOLN
500.0000 [IU] | Freq: Once | INTRAVENOUS | Status: AC | PRN
  Administered 2020-04-18: 500 [IU]
  Filled 2020-04-18: qty 5

## 2020-04-18 MED ORDER — PEGFILGRASTIM-CBQV 6 MG/0.6ML ~~LOC~~ SOSY
6.0000 mg | PREFILLED_SYRINGE | Freq: Once | SUBCUTANEOUS | Status: AC
  Administered 2020-04-18: 6 mg via SUBCUTANEOUS
  Filled 2020-04-18: qty 0.6

## 2020-04-18 MED ORDER — SODIUM CHLORIDE 0.9% FLUSH
10.0000 mL | INTRAVENOUS | Status: DC | PRN
  Administered 2020-04-18: 10 mL
  Filled 2020-04-18: qty 10

## 2020-04-18 NOTE — Patient Instructions (Signed)
Pegfilgrastim injection What is this medicine? PEGFILGRASTIM (PEG fil gra stim) is a long-acting granulocyte colony-stimulating factor that stimulates the growth of neutrophils, a type of white blood cell important in the body's fight against infection. It is used to reduce the incidence of fever and infection in patients with certain types of cancer who are receiving chemotherapy that affects the bone marrow, and to increase survival after being exposed to high doses of radiation. This medicine may be used for other purposes; ask your health care provider or pharmacist if you have questions. COMMON BRAND NAME(S): Fulphila, Neulasta, Nyvepria, UDENYCA, Ziextenzo What should I tell my health care provider before I take this medicine? They need to know if you have any of these conditions:  kidney disease  latex allergy  ongoing radiation therapy  sickle cell disease  skin reactions to acrylic adhesives (On-Body Injector only)  an unusual or allergic reaction to pegfilgrastim, filgrastim, other medicines, foods, dyes, or preservatives  pregnant or trying to get pregnant  breast-feeding How should I use this medicine? This medicine is for injection under the skin. If you get this medicine at home, you will be taught how to prepare and give the pre-filled syringe or how to use the On-body Injector. Refer to the patient Instructions for Use for detailed instructions. Use exactly as directed. Tell your healthcare provider immediately if you suspect that the On-body Injector may not have performed as intended or if you suspect the use of the On-body Injector resulted in a missed or partial dose. It is important that you put your used needles and syringes in a special sharps container. Do not put them in a trash can. If you do not have a sharps container, call your pharmacist or healthcare provider to get one. Talk to your pediatrician regarding the use of this medicine in children. While this drug  may be prescribed for selected conditions, precautions do apply. Overdosage: If you think you have taken too much of this medicine contact a poison control center or emergency room at once. NOTE: This medicine is only for you. Do not share this medicine with others. What if I miss a dose? It is important not to miss your dose. Call your doctor or health care professional if you miss your dose. If you miss a dose due to an On-body Injector failure or leakage, a new dose should be administered as soon as possible using a single prefilled syringe for manual use. What may interact with this medicine? Interactions have not been studied. This list may not describe all possible interactions. Give your health care provider a list of all the medicines, herbs, non-prescription drugs, or dietary supplements you use. Also tell them if you smoke, drink alcohol, or use illegal drugs. Some items may interact with your medicine. What should I watch for while using this medicine? Your condition will be monitored carefully while you are receiving this medicine. You may need blood work done while you are taking this medicine. Talk to your health care provider about your risk of cancer. You may be more at risk for certain types of cancer if you take this medicine. If you are going to need a MRI, CT scan, or other procedure, tell your doctor that you are using this medicine (On-Body Injector only). What side effects may I notice from receiving this medicine? Side effects that you should report to your doctor or health care professional as soon as possible:  allergic reactions (skin rash, itching or hives, swelling of   the face, lips, or tongue)  back pain  dizziness  fever  pain, redness, or irritation at site where injected  pinpoint red spots on the skin  red or dark-brown urine  shortness of breath or breathing problems  stomach or side pain, or pain at the shoulder  swelling  tiredness  trouble  passing urine or change in the amount of urine  unusual bruising or bleeding Side effects that usually do not require medical attention (report to your doctor or health care professional if they continue or are bothersome):  bone pain  muscle pain This list may not describe all possible side effects. Call your doctor for medical advice about side effects. You may report side effects to FDA at 1-800-FDA-1088. Where should I keep my medicine? Keep out of the reach of children. If you are using this medicine at home, you will be instructed on how to store it. Throw away any unused medicine after the expiration date on the label. NOTE: This sheet is a summary. It may not cover all possible information. If you have questions about this medicine, talk to your doctor, pharmacist, or health care provider.  2021 Elsevier/Gold Standard (2019-01-21 13:20:51)  

## 2020-04-20 ENCOUNTER — Encounter: Payer: Self-pay | Admitting: Dermatology

## 2020-04-20 NOTE — Progress Notes (Signed)
   New Patient   Subjective  Alexander Reeves. is a 55 y.o. male who presents for the following: New Patient (Initial Visit) (Patient here today for moles that have popped up on his right arm and right foot x last year. No bleeding, no itching, no pain.).  New dark spots Location:  Duration:  Quality:  Associated Signs/Symptoms: Modifying Factors:  Severity:  Timing: Context:    The following portions of the chart were reviewed this encounter and updated as appropriate:  Tobacco  Allergies  Meds  Problems  Med Hx  Surg Hx  Fam Hx      Objective  Well appearing patient in no apparent distress; mood and affect are within normal limits. Objective  Left Forearm - Posterior, Right Foot - Anterior, Right Forearm - Posterior: Dozen plus monochrome dark brown 5 mm macules mostly on extremities.  All with identical dermoscopic appearance.  Interpreter for hearing impaired present throughout visit.    A focused examination was performed including Head, neck, arms, legs, back.. Relevant physical exam findings are noted in the Assessment and Plan.   Assessment & Plan  Lentigines (3) Right Foot - Anterior; Left Forearm - Posterior; Right Forearm - Posterior  Although the lesions themselves are clinically benign and biopsy is unlikely to provide information that will change any clinical decisions, I did do some reading history of [place]'s visit.  93-month study showing in association of an inactivating mutations of the gene encoding the protein kinase A type I-? regulatory (R1?) subunit associated with colorectal cancer.(Kirschner LS, Carney JA, Pack SD, Taymans SE, Giatzakis C, Cho YS, [place], [place]. Mutations of the gene encoding the protein kinase A type I-? regulatory subunit in patients with the Carney complex. Nat Genet2000;26:89-92.)  I did discuss with Mr. Canavan the option of obtaining representative biopsies but he was content for now knowing that the dark spots  themselves did not represent malignancy.  Follow-up can be on a as needed basis.

## 2020-04-24 ENCOUNTER — Other Ambulatory Visit: Payer: Medicare Other

## 2020-04-24 ENCOUNTER — Ambulatory Visit: Payer: Medicare Other

## 2020-04-24 ENCOUNTER — Ambulatory Visit: Payer: Medicare Other | Admitting: Oncology

## 2020-04-26 ENCOUNTER — Other Ambulatory Visit: Payer: Self-pay | Admitting: Oncology

## 2020-05-01 ENCOUNTER — Inpatient Hospital Stay: Payer: Medicare Other

## 2020-05-01 ENCOUNTER — Inpatient Hospital Stay (HOSPITAL_BASED_OUTPATIENT_CLINIC_OR_DEPARTMENT_OTHER): Payer: Medicare Other | Admitting: Oncology

## 2020-05-01 ENCOUNTER — Encounter: Payer: Self-pay | Admitting: Nutrition

## 2020-05-01 ENCOUNTER — Other Ambulatory Visit: Payer: Self-pay

## 2020-05-01 VITALS — BP 141/90 | HR 81 | Temp 98.7°F | Resp 20 | Ht 70.0 in | Wt 152.6 lb

## 2020-05-01 DIAGNOSIS — C182 Malignant neoplasm of ascending colon: Secondary | ICD-10-CM

## 2020-05-01 DIAGNOSIS — Z9049 Acquired absence of other specified parts of digestive tract: Secondary | ICD-10-CM | POA: Diagnosis not present

## 2020-05-01 DIAGNOSIS — C189 Malignant neoplasm of colon, unspecified: Secondary | ICD-10-CM

## 2020-05-01 DIAGNOSIS — M79622 Pain in left upper arm: Secondary | ICD-10-CM | POA: Diagnosis not present

## 2020-05-01 DIAGNOSIS — Z95828 Presence of other vascular implants and grafts: Secondary | ICD-10-CM

## 2020-05-01 DIAGNOSIS — C787 Secondary malignant neoplasm of liver and intrahepatic bile duct: Secondary | ICD-10-CM | POA: Diagnosis not present

## 2020-05-01 DIAGNOSIS — Z5111 Encounter for antineoplastic chemotherapy: Secondary | ICD-10-CM | POA: Diagnosis not present

## 2020-05-01 DIAGNOSIS — D701 Agranulocytosis secondary to cancer chemotherapy: Secondary | ICD-10-CM | POA: Diagnosis not present

## 2020-05-01 LAB — CBC WITH DIFFERENTIAL (CANCER CENTER ONLY)
Abs Immature Granulocytes: 0.29 10*3/uL — ABNORMAL HIGH (ref 0.00–0.07)
Basophils Absolute: 0 10*3/uL (ref 0.0–0.1)
Basophils Relative: 1 %
Eosinophils Absolute: 0.1 10*3/uL (ref 0.0–0.5)
Eosinophils Relative: 2 %
HCT: 34.7 % — ABNORMAL LOW (ref 39.0–52.0)
Hemoglobin: 10.6 g/dL — ABNORMAL LOW (ref 13.0–17.0)
Immature Granulocytes: 4 %
Lymphocytes Relative: 27 %
Lymphs Abs: 1.8 10*3/uL (ref 0.7–4.0)
MCH: 24.9 pg — ABNORMAL LOW (ref 26.0–34.0)
MCHC: 30.5 g/dL (ref 30.0–36.0)
MCV: 81.5 fL (ref 80.0–100.0)
Monocytes Absolute: 0.5 10*3/uL (ref 0.1–1.0)
Monocytes Relative: 8 %
Neutro Abs: 4 10*3/uL (ref 1.7–7.7)
Neutrophils Relative %: 58 %
Platelet Count: 190 10*3/uL (ref 150–400)
RBC: 4.26 MIL/uL (ref 4.22–5.81)
RDW: 18 % — ABNORMAL HIGH (ref 11.5–15.5)
WBC Count: 6.7 10*3/uL (ref 4.0–10.5)
nRBC: 0.4 % — ABNORMAL HIGH (ref 0.0–0.2)

## 2020-05-01 LAB — CMP (CANCER CENTER ONLY)
ALT: 11 U/L (ref 0–44)
AST: 15 U/L (ref 15–41)
Albumin: 4.1 g/dL (ref 3.5–5.0)
Alkaline Phosphatase: 69 U/L (ref 38–126)
Anion gap: 6 (ref 5–15)
BUN: 14 mg/dL (ref 6–20)
CO2: 26 mmol/L (ref 22–32)
Calcium: 9.2 mg/dL (ref 8.9–10.3)
Chloride: 107 mmol/L (ref 98–111)
Creatinine: 1.25 mg/dL — ABNORMAL HIGH (ref 0.61–1.24)
GFR, Estimated: >60 mL/min (ref >60)
Glucose, Bld: 106 mg/dL — ABNORMAL HIGH (ref 70–99)
Potassium: 3.8 mmol/L (ref 3.5–5.1)
Sodium: 139 mmol/L (ref 135–145)
Total Bilirubin: 0.3 mg/dL (ref 0.3–1.2)
Total Protein: 6.7 g/dL (ref 6.5–8.1)

## 2020-05-01 MED ORDER — SODIUM CHLORIDE 0.9 % IV SOLN
180.0000 mg/m2 | Freq: Once | INTRAVENOUS | Status: AC
  Administered 2020-05-01: 320 mg via INTRAVENOUS
  Filled 2020-05-01: qty 16

## 2020-05-01 MED ORDER — SODIUM CHLORIDE 0.9 % IV SOLN
2400.0000 mg/m2 | INTRAVENOUS | Status: DC
  Administered 2020-05-01: 4300 mg via INTRAVENOUS
  Filled 2020-05-01: qty 86

## 2020-05-01 MED ORDER — HEPARIN SOD (PORK) LOCK FLUSH 100 UNIT/ML IV SOLN
250.0000 [IU] | Freq: Once | INTRAVENOUS | Status: DC | PRN
  Filled 2020-05-01: qty 5

## 2020-05-01 MED ORDER — SODIUM CHLORIDE 0.9 % IV SOLN
Freq: Once | INTRAVENOUS | Status: AC
  Filled 2020-05-01: qty 250

## 2020-05-01 MED ORDER — DEXAMETHASONE SODIUM PHOSPHATE 100 MG/10ML IJ SOLN
10.0000 mg | Freq: Once | INTRAMUSCULAR | Status: AC
Start: 2020-05-01 — End: 2020-05-01
  Administered 2020-05-01: 10 mg via INTRAVENOUS
  Filled 2020-05-01: qty 1

## 2020-05-01 MED ORDER — SODIUM CHLORIDE 0.9% FLUSH
10.0000 mL | INTRAVENOUS | Status: DC | PRN
  Filled 2020-05-01: qty 10

## 2020-05-01 MED ORDER — ATROPINE SULFATE 1 MG/ML IJ SOLN: 0.5000 mg | Freq: Once | INTRAMUSCULAR | Status: DC | PRN

## 2020-05-01 MED ORDER — SODIUM CHLORIDE 0.9% FLUSH
3.0000 mL | INTRAVENOUS | Status: DC | PRN
  Filled 2020-05-01: qty 10

## 2020-05-01 MED ORDER — HEPARIN SOD (PORK) LOCK FLUSH 100 UNIT/ML IV SOLN
500.0000 [IU] | Freq: Once | INTRAVENOUS | Status: DC | PRN
  Filled 2020-05-01: qty 5

## 2020-05-01 MED ORDER — PALONOSETRON HCL INJECTION 0.25 MG/5ML
INTRAVENOUS | Status: AC
  Filled 2020-05-01: qty 5

## 2020-05-01 MED ORDER — SODIUM CHLORIDE 0.9 % IV SOLN
400.0000 mg/m2 | Freq: Once | INTRAVENOUS | Status: AC
  Administered 2020-05-01: 716 mg via INTRAVENOUS
  Filled 2020-05-01: qty 35.8

## 2020-05-01 MED ORDER — FLUOROURACIL CHEMO INJECTION 2.5 GM/50ML
400.0000 mg/m2 | Freq: Once | INTRAVENOUS | Status: AC
  Administered 2020-05-01: 700 mg via INTRAVENOUS
  Filled 2020-05-01: qty 14

## 2020-05-01 MED ORDER — SODIUM CHLORIDE 0.9 % IV SOLN
Freq: Once | INTRAVENOUS | Status: AC
Start: 2020-05-01 — End: 2020-05-01
  Filled 2020-05-01: qty 250

## 2020-05-01 MED ORDER — ALTEPLASE 2 MG IJ SOLR
2.0000 mg | Freq: Once | INTRAMUSCULAR | Status: DC | PRN
  Filled 2020-05-01: qty 2

## 2020-05-01 MED ORDER — PALONOSETRON HCL INJECTION 0.25 MG/5ML
0.2500 mg | Freq: Once | INTRAVENOUS | Status: AC
  Administered 2020-05-01: 0.25 mg via INTRAVENOUS

## 2020-05-01 NOTE — Progress Notes (Signed)
Pt scheduled in flush completed in infusion flush encounter closed

## 2020-05-01 NOTE — Progress Notes (Signed)
Patient given one complimentary case of ensure plus.

## 2020-05-01 NOTE — Progress Notes (Signed)
[place] OFFICE PROGRESS NOTE   Diagnosis: Colon cancer  INTERVAL HISTORY:   Alexander Reeves completed 1 cycle of FOLFIRI on 04/16/2020.  No nausea, mouth sores, or diarrhea.  He continues to have intermittent discomfort at the rectum.  He complains of "twitching" of the left arm and mild discomfort of the left arm.  No dyspnea.  He has intermittent "heartburn".  He is here today with a sign language interpreter.  He has noticed a dark area under the tongue.  Objective:  Vital signs in last 24 hours:  Blood pressure (!) 141/90, pulse 81, temperature 98.7 F (37.1 C), temperature source Tympanic, resp. rate 20, height '5\' 10"'  (1.778 m), weight 152 lb 9.6 oz (69.2 kg), SpO2 98 %.    HEENT: Hyperpigmentation of the buccal mucosal and subungual areas, no thrush or ulcers Resp: Decreased breath sounds at the right lower posterior chest, no respiratory distress Cardio: Regular rate and rhythm GI: No hepatosplenomegaly, nontender Vascular: No leg edema Musculoskeletal: Examination of the left arm is unremarkable.  No pain with motion at the left shoulder.  No swelling or tenderness.  Portacath/PICC-without erythema  Lab Results:  Lab Results  Component Value Date   WBC 6.7 05/01/2020   HGB 10.6 (L) 05/01/2020   HCT 34.7 (L) 05/01/2020   MCV 81.5 05/01/2020   PLT 190 05/01/2020   NEUTROABS 4.0 05/01/2020    CMP  Lab Results  Component Value Date   NA 139 05/01/2020   K 3.8 05/01/2020   CL 107 05/01/2020   CO2 26 05/01/2020   GLUCOSE 106 (H) 05/01/2020   BUN 14 05/01/2020   CREATININE 1.25 (H) 05/01/2020   CALCIUM 9.2 05/01/2020   PROT 6.7 05/01/2020   ALBUMIN 4.1 05/01/2020   AST 15 05/01/2020   ALT 11 05/01/2020   ALKPHOS 69 05/01/2020   BILITOT 0.3 05/01/2020   GFRNONAA >60 05/01/2020   GFRAA >60 09/28/2019    Lab Results  Component Value Date   CEA1 1.75 09/28/2019    Medications: I have reviewed the patient's current  medications.   Assessment/Plan: 1.Moderately differentiated adenocarcinoma ascending colon, stage IIIb (pT3pN1), status post a right colectomy 11/19/2018 ? Lymphovascular and perineural invasion present, 2/14 lymph nodes positive, tumor deposits present ? Positive radial margin, no loss of mismatch repair protein expression, discussed case with Dr. Craig Staggers mass with surrounding inflammation at multiple margins, no gross residual disease, unclear which is the "positive "radial margin, further surgery and radiation not recommended ? Colonoscopy 11/19/2018-completely obstructing mid ascending colon mass, could not be passed with endoscope, biopsy confirmed invasive adenocarcinoma ? CT abdomen/pelvis 11/18/2018-wall thickening at the mid and distal ascending colon with mild distention of the proximal ascending colon and cecum ? CTs 10/17/2018--no acute findings, no chest lymphadenopathy, lungs clear ? Cycle 1 FOLFOX 12/28/2018 ? Cycle 2 FOLFOX 01/11/2019, Udenyca added ? Cycle 3 FOLFOX 01/25/2019,Udenyca held due to bone pain ? Cycle 4 FOLFOX 02/21/2019, Udenyca added ? Cycle 5 FOLFOX 03/09/2019, Udenyca ? Cycle 6 FOLFOX 03/21/2019, Udenyca ? Cycle 7 FOLFOX 04/04/2019, Udenyca ? Cycle 8 FOLFOX 04/18/2019, Udenyca ? Cycle 9 FOLFOX 05/02/2019, Udenyca ? Cycle 10 FOLFOX 05/16/2019, oxaliplatin, 5-FU bolus, and Udenyca held ? Cycle 11 FOLFOX 05/30/2019, oxaliplatin, 5-FU bolus, and Udenyca held ? Cycle 12 FOLFOX 06/15/2019, oxaliplatin, 5-FU bolus and Udenyca held ? CTs 09/29/2019-new hypodense enhancing liver masses consistent with metastatic disease, indistinct marginated nodularity below the pancreas head-likely small lymph nodes ? Biopsy liver lesion 10/18/2019-adenocarcinoma consistent with history of colorectal  carcinoma ? Cycle 1 FOLFIRI/Avastin 11/01/2019 ? Cycle 2 FOLFIRI/Avastin 11/15/2019 ? Cycle 3 FOLFIRI/Avastin 11/29/2019 ? Cycle 4 FOLFIRI/Avastin 12/13/2019 ? CT chest 12/27/2019-no  significant change in hepatic metastases, submassive pulmonary emboli ? Cycle 5 FOLFIRI 01/10/2020 (Avastin discontinued) ? Cycle 6 FOLFIRI 02/01/2020 ? Cycle 7 FOLFIRI 02/13/2020 ? Cycle 8 FOLFIRI 02/27/2020 ? CTs abdomen/pelvis 03/09/2020-per Dr. Gearldine Shown review overall stable disease ? Cycle 9 FOLFIRI 04/16/2020 ? Cycle 10 FOLFIRI 05/01/2020  2. Deaf 3. Right epididymal cyst removal 03/22/2018 4. Asthma 5. Port-A-Cath placement, [name], 12/23/2018 6. Neutropenia secondary to chemotherapy-Udenyca added for cycle 2 FOLFOX 7. Admission with febrile neutropenia 02/08/2019 8. Hospital admission with submassive PEand left lower extremity DVTon 12/27/2019, heparin, transition to Xarelto 12/28/2019  CT chest 12/27/2019-submassive pulmonary emboli with evidence of right heart strain, no significant change in hepatic metastases  9. 01/24/2020 question perianal abscess/cyst-referred to surgery, treated with antibiotics-improved; possible perianal fistula.  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  He will complete another treatment with FOLFIRI today.  He will return for an office visit and chemotherapy in 2 weeks.  He will be referred for a restaging CT evaluation after the 05/28/2020 cycle of FOLFIRI.  The left arm discomfort is likely related to a benign musculoskeletal condition.  He will call for increased pain.  Betsy Coder, MD  05/01/2020  9:43 AM

## 2020-05-03 ENCOUNTER — Inpatient Hospital Stay: Payer: Medicare Other

## 2020-05-03 ENCOUNTER — Other Ambulatory Visit: Payer: Self-pay

## 2020-05-03 DIAGNOSIS — Z9049 Acquired absence of other specified parts of digestive tract: Secondary | ICD-10-CM | POA: Diagnosis not present

## 2020-05-03 DIAGNOSIS — C182 Malignant neoplasm of ascending colon: Secondary | ICD-10-CM | POA: Diagnosis not present

## 2020-05-03 DIAGNOSIS — Z5111 Encounter for antineoplastic chemotherapy: Secondary | ICD-10-CM | POA: Diagnosis not present

## 2020-05-03 DIAGNOSIS — M79622 Pain in left upper arm: Secondary | ICD-10-CM | POA: Diagnosis not present

## 2020-05-03 DIAGNOSIS — C787 Secondary malignant neoplasm of liver and intrahepatic bile duct: Secondary | ICD-10-CM | POA: Diagnosis not present

## 2020-05-03 DIAGNOSIS — D701 Agranulocytosis secondary to cancer chemotherapy: Secondary | ICD-10-CM | POA: Diagnosis not present

## 2020-05-03 DIAGNOSIS — C189 Malignant neoplasm of colon, unspecified: Secondary | ICD-10-CM

## 2020-05-03 DIAGNOSIS — Z95828 Presence of other vascular implants and grafts: Secondary | ICD-10-CM

## 2020-05-03 MED ORDER — SODIUM CHLORIDE 0.9% FLUSH
10.0000 mL | INTRAVENOUS | Status: DC | PRN
  Administered 2020-05-03: 10 mL
  Filled 2020-05-03: qty 10

## 2020-05-03 MED ORDER — HEPARIN SOD (PORK) LOCK FLUSH 100 UNIT/ML IV SOLN
500.0000 [IU] | Freq: Once | INTRAVENOUS | Status: DC | PRN
  Filled 2020-05-03: qty 5

## 2020-05-03 MED ORDER — HEPARIN SOD (PORK) LOCK FLUSH 100 UNIT/ML IV SOLN
500.0000 [IU] | Freq: Once | INTRAVENOUS | Status: AC | PRN
  Administered 2020-05-03: 500 [IU]
  Filled 2020-05-03: qty 5

## 2020-05-03 MED ORDER — SODIUM CHLORIDE 0.9% FLUSH
10.0000 mL | INTRAVENOUS | Status: DC | PRN
  Filled 2020-05-03: qty 10

## 2020-05-03 MED ORDER — PEGFILGRASTIM-CBQV 6 MG/0.6ML ~~LOC~~ SOSY
6.0000 mg | PREFILLED_SYRINGE | Freq: Once | SUBCUTANEOUS | Status: AC
  Administered 2020-05-03: 6 mg via SUBCUTANEOUS

## 2020-05-03 NOTE — Patient Instructions (Signed)
Pegfilgrastim injection What is this medicine? PEGFILGRASTIM (PEG fil gra stim) is a long-acting granulocyte colony-stimulating factor that stimulates the growth of neutrophils, a type of white blood cell important in the body's fight against infection. It is used to reduce the incidence of fever and infection in patients with certain types of cancer who are receiving chemotherapy that affects the bone marrow, and to increase survival after being exposed to high doses of radiation. This medicine may be used for other purposes; ask your health care provider or pharmacist if you have questions. COMMON BRAND NAME(S): Fulphila, Neulasta, Nyvepria, UDENYCA, Ziextenzo What should I tell my health care provider before I take this medicine? They need to know if you have any of these conditions:  kidney disease  latex allergy  ongoing radiation therapy  sickle cell disease  skin reactions to acrylic adhesives (On-Body Injector only)  an unusual or allergic reaction to pegfilgrastim, filgrastim, other medicines, foods, dyes, or preservatives  pregnant or trying to get pregnant  breast-feeding How should I use this medicine? This medicine is for injection under the skin. If you get this medicine at home, you will be taught how to prepare and give the pre-filled syringe or how to use the On-body Injector. Refer to the patient Instructions for Use for detailed instructions. Use exactly as directed. Tell your healthcare provider immediately if you suspect that the On-body Injector may not have performed as intended or if you suspect the use of the On-body Injector resulted in a missed or partial dose. It is important that you put your used needles and syringes in a special sharps container. Do not put them in a trash can. If you do not have a sharps container, call your pharmacist or healthcare provider to get one. Talk to your pediatrician regarding the use of this medicine in children. While this drug  may be prescribed for selected conditions, precautions do apply. Overdosage: If you think you have taken too much of this medicine contact a poison control center or emergency room at once. NOTE: This medicine is only for you. Do not share this medicine with others. What if I miss a dose? It is important not to miss your dose. Call your doctor or health care professional if you miss your dose. If you miss a dose due to an On-body Injector failure or leakage, a new dose should be administered as soon as possible using a single prefilled syringe for manual use. What may interact with this medicine? Interactions have not been studied. This list may not describe all possible interactions. Give your health care provider a list of all the medicines, herbs, non-prescription drugs, or dietary supplements you use. Also tell them if you smoke, drink alcohol, or use illegal drugs. Some items may interact with your medicine. What should I watch for while using this medicine? Your condition will be monitored carefully while you are receiving this medicine. You may need blood work done while you are taking this medicine. Talk to your health care provider about your risk of cancer. You may be more at risk for certain types of cancer if you take this medicine. If you are going to need a MRI, CT scan, or other procedure, tell your doctor that you are using this medicine (On-Body Injector only). What side effects may I notice from receiving this medicine? Side effects that you should report to your doctor or health care professional as soon as possible:  allergic reactions (skin rash, itching or hives, swelling of   the face, lips, or tongue)  back pain  dizziness  fever  pain, redness, or irritation at site where injected  pinpoint red spots on the skin  red or dark-brown urine  shortness of breath or breathing problems  stomach or side pain, or pain at the shoulder  swelling  tiredness  trouble  passing urine or change in the amount of urine  unusual bruising or bleeding Side effects that usually do not require medical attention (report to your doctor or health care professional if they continue or are bothersome):  bone pain  muscle pain This list may not describe all possible side effects. Call your doctor for medical advice about side effects. You may report side effects to FDA at 1-800-FDA-1088. Where should I keep my medicine? Keep out of the reach of children. If you are using this medicine at home, you will be instructed on how to store it. Throw away any unused medicine after the expiration date on the label. NOTE: This sheet is a summary. It may not cover all possible information. If you have questions about this medicine, talk to your doctor, pharmacist, or health care provider.  2021 Elsevier/Gold Standard (2019-01-21 13:20:51)  

## 2020-05-13 ENCOUNTER — Other Ambulatory Visit: Payer: Self-pay | Admitting: Oncology

## 2020-05-14 ENCOUNTER — Inpatient Hospital Stay: Payer: Medicare Other | Attending: Nurse Practitioner

## 2020-05-14 ENCOUNTER — Ambulatory Visit: Payer: Medicare Other | Admitting: Nurse Practitioner

## 2020-05-14 ENCOUNTER — Inpatient Hospital Stay (HOSPITAL_BASED_OUTPATIENT_CLINIC_OR_DEPARTMENT_OTHER): Payer: Medicare Other | Admitting: Nurse Practitioner

## 2020-05-14 ENCOUNTER — Encounter: Payer: Self-pay | Admitting: General Practice

## 2020-05-14 ENCOUNTER — Inpatient Hospital Stay: Payer: Medicare Other

## 2020-05-14 ENCOUNTER — Other Ambulatory Visit: Payer: Self-pay

## 2020-05-14 ENCOUNTER — Ambulatory Visit: Payer: Medicare Other

## 2020-05-14 ENCOUNTER — Encounter: Payer: Self-pay | Admitting: Nurse Practitioner

## 2020-05-14 ENCOUNTER — Other Ambulatory Visit: Payer: Medicare Other

## 2020-05-14 VITALS — HR 90 | Temp 97.8°F | Resp 18 | Ht 70.0 in | Wt 152.6 lb

## 2020-05-14 DIAGNOSIS — C182 Malignant neoplasm of ascending colon: Secondary | ICD-10-CM | POA: Insufficient documentation

## 2020-05-14 DIAGNOSIS — T451X5A Adverse effect of antineoplastic and immunosuppressive drugs, initial encounter: Secondary | ICD-10-CM | POA: Diagnosis not present

## 2020-05-14 DIAGNOSIS — Z7901 Long term (current) use of anticoagulants: Secondary | ICD-10-CM | POA: Diagnosis not present

## 2020-05-14 DIAGNOSIS — Z5111 Encounter for antineoplastic chemotherapy: Secondary | ICD-10-CM | POA: Insufficient documentation

## 2020-05-14 DIAGNOSIS — Z86718 Personal history of other venous thrombosis and embolism: Secondary | ICD-10-CM | POA: Insufficient documentation

## 2020-05-14 DIAGNOSIS — D701 Agranulocytosis secondary to cancer chemotherapy: Secondary | ICD-10-CM | POA: Diagnosis not present

## 2020-05-14 DIAGNOSIS — C7989 Secondary malignant neoplasm of other specified sites: Secondary | ICD-10-CM | POA: Diagnosis not present

## 2020-05-14 DIAGNOSIS — C779 Secondary and unspecified malignant neoplasm of lymph node, unspecified: Secondary | ICD-10-CM | POA: Diagnosis not present

## 2020-05-14 DIAGNOSIS — Z86711 Personal history of pulmonary embolism: Secondary | ICD-10-CM | POA: Diagnosis not present

## 2020-05-14 LAB — CBC WITH DIFFERENTIAL (CANCER CENTER ONLY)
Abs Immature Granulocytes: 0.83 10*3/uL — ABNORMAL HIGH (ref 0.00–0.07)
Basophils Absolute: 0.1 10*3/uL (ref 0.0–0.1)
Basophils Relative: 1 %
Eosinophils Absolute: 0.1 10*3/uL (ref 0.0–0.5)
Eosinophils Relative: 1 %
HCT: 35.7 % — ABNORMAL LOW (ref 39.0–52.0)
Hemoglobin: 10.8 g/dL — ABNORMAL LOW (ref 13.0–17.0)
Immature Granulocytes: 12 %
Lymphocytes Relative: 22 %
Lymphs Abs: 1.6 10*3/uL (ref 0.7–4.0)
MCH: 24.7 pg — ABNORMAL LOW (ref 26.0–34.0)
MCHC: 30.3 g/dL (ref 30.0–36.0)
MCV: 81.7 fL (ref 80.0–100.0)
Monocytes Absolute: 0.7 10*3/uL (ref 0.1–1.0)
Monocytes Relative: 10 %
Neutro Abs: 3.9 10*3/uL (ref 1.7–7.7)
Neutrophils Relative %: 54 %
Platelet Count: 163 10*3/uL (ref 150–400)
RBC: 4.37 MIL/uL (ref 4.22–5.81)
RDW: 18.9 % — ABNORMAL HIGH (ref 11.5–15.5)
WBC Count: 7.2 10*3/uL (ref 4.0–10.5)
nRBC: 1.2 % — ABNORMAL HIGH (ref 0.0–0.2)

## 2020-05-14 LAB — CMP (CANCER CENTER ONLY)
ALT: 12 U/L (ref 0–44)
AST: 15 U/L (ref 15–41)
Albumin: 4.2 g/dL (ref 3.5–5.0)
Alkaline Phosphatase: 81 U/L (ref 38–126)
Anion gap: 7 (ref 5–15)
BUN: 9 mg/dL (ref 6–20)
CO2: 27 mmol/L (ref 22–32)
Calcium: 9.5 mg/dL (ref 8.9–10.3)
Chloride: 106 mmol/L (ref 98–111)
Creatinine: 1.21 mg/dL (ref 0.61–1.24)
GFR, Estimated: >60 mL/min (ref >60)
Glucose, Bld: 83 mg/dL (ref 70–99)
Potassium: 3.9 mmol/L (ref 3.5–5.1)
Sodium: 140 mmol/L (ref 135–145)
Total Bilirubin: 0.3 mg/dL (ref 0.3–1.2)
Total Protein: 6.6 g/dL (ref 6.5–8.1)

## 2020-05-14 NOTE — Progress Notes (Signed)
[facility] OFFICE PROGRESS NOTE   Diagnosis: Colon cancer  INTERVAL HISTORY:   Alexander Reeves returns as scheduled.  He completed cycle 10 FOLFIRI 05/01/2020.  He denies nausea/vomiting.  No mouth sores.  No diarrhea.  He has occasional back spasms.  The spasms tend to occur when he is active.  Objective:  Vital signs in last 24 hours:  Pulse 90, temperature 97.8 F (36.6 C), temperature source Oral, resp. rate 18, height 5' 10" (1.778 m), weight 152 lb 9.6 oz (69.2 kg), SpO2 98 %.    HEENT: Hyperpigmentation of the buccal mucosa.  No thrush or ulcers. Resp: Diminished breath sounds right lower posterior chest.  No respiratory distress. Cardio: Regular rate and rhythm. GI: Abdomen soft and nontender.  No hepatosplenomegaly. Vascular: No leg edema. Skin: Palms with hyperpigmentation. Port-A-Cath without erythema.  Lab Results:  Lab Results  Component Value Date   WBC 7.2 05/14/2020   HGB 10.8 (L) 05/14/2020   HCT 35.7 (L) 05/14/2020   MCV 81.7 05/14/2020   PLT 163 05/14/2020   NEUTROABS 3.9 05/14/2020    Imaging:  No results found.  Medications: I have reviewed the patient's current medications.  Assessment/Plan: 1.Moderately differentiated adenocarcinoma ascending colon, stage IIIb (pT3pN1), status post a right colectomy 11/19/2018 ? Lymphovascular and perineural invasion present, 2/14 lymph nodes positive, tumor deposits present ? Positive radial margin, no loss of mismatch repair protein expression, discussed case with Dr. Craig Reeves mass with surrounding inflammation at multiple margins, no gross residual disease, unclear which is the "positive "radial margin, further surgery and radiation not recommended ? Colonoscopy 11/19/2018-completely obstructing mid ascending colon mass, could not be passed with endoscope, biopsy confirmed invasive adenocarcinoma ? CT abdomen/pelvis 11/18/2018-wall thickening at the mid and distal ascending colon with mild  distention of the proximal ascending colon and cecum ? CTs 10/17/2018--no acute findings, no chest lymphadenopathy, lungs clear ? Cycle 1 FOLFOX 12/28/2018 ? Cycle 2 FOLFOX 01/11/2019, Udenyca added ? Cycle 3 FOLFOX 01/25/2019,Udenyca held due to bone pain ? Cycle 4 FOLFOX 02/21/2019, Udenyca added ? Cycle 5 FOLFOX 03/09/2019, Udenyca ? Cycle 6 FOLFOX 03/21/2019, Udenyca ? Cycle 7 FOLFOX 04/04/2019, Udenyca ? Cycle 8 FOLFOX 04/18/2019, Udenyca ? Cycle 9 FOLFOX 05/02/2019, Udenyca ? Cycle 10 FOLFOX 05/16/2019, oxaliplatin, 5-FU bolus, and Udenyca held ? Cycle 11 FOLFOX 05/30/2019, oxaliplatin, 5-FU bolus, and Udenyca held ? Cycle 12 FOLFOX 06/15/2019, oxaliplatin, 5-FU bolus and Udenyca held ? CTs 09/29/2019-new hypodense enhancing liver masses consistent with metastatic disease, indistinct marginated nodularity below the pancreas head-likely small lymph nodes ? Biopsy liver lesion 10/18/2019-adenocarcinoma consistent with history of colorectal carcinoma ? Cycle 1 FOLFIRI/Avastin 11/01/2019 ? Cycle 2 FOLFIRI/Avastin 11/15/2019 ? Cycle 3 FOLFIRI/Avastin 11/29/2019 ? Cycle 4 FOLFIRI/Avastin 12/13/2019 ? CT chest 12/27/2019-no significant change in hepatic metastases, submassive pulmonary emboli ? Cycle 5 FOLFIRI 01/10/2020 (Avastin discontinued) ? Cycle 6 FOLFIRI 02/01/2020 ? Cycle 7 FOLFIRI 02/13/2020 ? Cycle8FOLFIRI 02/27/2020 ? CTs abdomen/pelvis 03/09/2020-per Dr. Gearldine Reeves review overall stable disease ? Cycle 9 FOLFIRI 04/16/2020 ? Cycle 10 FOLFIRI 05/01/2020 ? Cycle 11 FOLFIRI 05/15/2020  2. Deaf 3. Right epididymal cyst removal 03/22/2018 4. Asthma 5. Port-A-Cath placement, Dr. Donne Reeves, 12/23/2018 6. Neutropenia secondary to chemotherapy-Udenyca added for cycle 2 FOLFOX 7. Admission with febrile neutropenia 02/08/2019 8. Hospital admission with submassive PEand left lower extremity DVTon 12/27/2019, heparin, transition to Xarelto 12/28/2019  CT chest 12/27/2019-submassive pulmonary emboli  with evidence of right heart strain, no significant change in hepatic metastases  9. 01/24/2020 question perianal abscess/cyst-referred to surgery,  treated with antibiotics-improved; possible perianal fistula.  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  He has completed 10 cycles of FOLFIRI.  He is tolerating chemotherapy well.  Plan to proceed with cycle 11 as scheduled 05/15/2020.  Restaging CT scans after cycle 12.  We reviewed the CBC and chemistry panel from today.  Labs adequate to proceed as above.  He will return for lab, follow-up, cycle 12 FOLFIRI in 2 weeks.  Ned Card ANP/GNP-BC   05/14/2020  2:53 PM

## 2020-05-14 NOTE — Progress Notes (Signed)
[facility] Spiritual Care Note  With significant logistical support from Arloa Koh, Asst Director of Nursing/[employer], set up a Webex appointment on May 17 at 9:30 to discuss Advance Directives via Webex with Mr Fouch and his ASL interpreter. Webex invitation has gone to [place] and [place]/RN for logistical support.   [place], [place], [facility] Pager [phone] Voicemail [phone]

## 2020-05-15 ENCOUNTER — Inpatient Hospital Stay: Payer: Medicare Other

## 2020-05-15 VITALS — BP 131/79 | HR 94 | Temp 97.7°F | Resp 18

## 2020-05-15 DIAGNOSIS — T451X5A Adverse effect of antineoplastic and immunosuppressive drugs, initial encounter: Secondary | ICD-10-CM | POA: Diagnosis not present

## 2020-05-15 DIAGNOSIS — C182 Malignant neoplasm of ascending colon: Secondary | ICD-10-CM | POA: Diagnosis not present

## 2020-05-15 DIAGNOSIS — Z5111 Encounter for antineoplastic chemotherapy: Secondary | ICD-10-CM | POA: Diagnosis not present

## 2020-05-15 DIAGNOSIS — C189 Malignant neoplasm of colon, unspecified: Secondary | ICD-10-CM

## 2020-05-15 DIAGNOSIS — D701 Agranulocytosis secondary to cancer chemotherapy: Secondary | ICD-10-CM | POA: Diagnosis not present

## 2020-05-15 DIAGNOSIS — C7989 Secondary malignant neoplasm of other specified sites: Secondary | ICD-10-CM | POA: Diagnosis not present

## 2020-05-15 DIAGNOSIS — C779 Secondary and unspecified malignant neoplasm of lymph node, unspecified: Secondary | ICD-10-CM | POA: Diagnosis not present

## 2020-05-15 MED ORDER — SODIUM CHLORIDE 0.9 % IV SOLN
2400.0000 mg/m2 | INTRAVENOUS | Status: DC
  Administered 2020-05-15: 4300 mg via INTRAVENOUS
  Filled 2020-05-15: qty 86

## 2020-05-15 MED ORDER — SODIUM CHLORIDE 0.9 % IV SOLN
400.0000 mg/m2 | Freq: Once | INTRAVENOUS | Status: AC
  Administered 2020-05-15: 716 mg via INTRAVENOUS
  Filled 2020-05-15: qty 35.8

## 2020-05-15 MED ORDER — SODIUM CHLORIDE 0.9 % IV SOLN
Freq: Once | INTRAVENOUS | Status: AC
Start: 2020-05-15 — End: 2020-05-15
  Filled 2020-05-15: qty 250

## 2020-05-15 MED ORDER — SODIUM CHLORIDE 0.9 % IV SOLN
Freq: Once | INTRAVENOUS | Status: AC
  Filled 2020-05-15: qty 250

## 2020-05-15 MED ORDER — ATROPINE SULFATE 1 MG/ML IJ SOLN
0.5000 mg | Freq: Once | INTRAMUSCULAR | Status: DC | PRN
  Filled 2020-05-15: qty 1

## 2020-05-15 MED ORDER — PALONOSETRON HCL INJECTION 0.25 MG/5ML
0.2500 mg | Freq: Once | INTRAVENOUS | Status: AC
  Administered 2020-05-15: 0.25 mg via INTRAVENOUS
  Filled 2020-05-15: qty 5

## 2020-05-15 MED ORDER — DEXAMETHASONE SODIUM PHOSPHATE 100 MG/10ML IJ SOLN
10.0000 mg | Freq: Once | INTRAMUSCULAR | Status: AC
  Administered 2020-05-15: 10 mg via INTRAVENOUS
  Filled 2020-05-15: qty 1

## 2020-05-15 MED ORDER — FLUOROURACIL CHEMO INJECTION 2.5 GM/50ML
400.0000 mg/m2 | Freq: Once | INTRAVENOUS | Status: AC
  Administered 2020-05-15: 700 mg via INTRAVENOUS
  Filled 2020-05-15: qty 14

## 2020-05-15 MED ORDER — SODIUM CHLORIDE 0.9 % IV SOLN
180.0000 mg/m2 | Freq: Once | INTRAVENOUS | Status: AC
  Administered 2020-05-15: 320 mg via INTRAVENOUS
  Filled 2020-05-15: qty 16

## 2020-05-15 NOTE — Patient Instructions (Signed)
[facility]  Discharge Instructions: Thank you for choosing [facility] to provide your oncology and hematology care.   If you have a lab appointment with the [facility], please go directly to the [facility] and check in at the registration area.   Wear comfortable clothing and clothing appropriate for easy access to any Portacath or PICC line.   We strive to give you quality time with your provider. You may need to reschedule your appointment if you arrive late (15 or more minutes).  Arriving late affects you and other patients whose appointments are after yours.  Also, if you miss three or more appointments without notifying the office, you may be dismissed from the clinic at the provider's discretion.      For prescription refill requests, have your pharmacy contact our office and allow 72 hours for refills to be completed.    Today you received the following chemotherapy and/or immunotherapy agents Camostar, Leucovorin, Fluorouracil   To help prevent nausea and vomiting after your treatment, we encourage you to take your nausea medication as directed.  BELOW ARE SYMPTOMS THAT SHOULD BE REPORTED IMMEDIATELY: . *FEVER GREATER THAN 100.4 F (38 C) OR HIGHER . *CHILLS OR SWEATING . *NAUSEA AND VOMITING THAT IS NOT CONTROLLED WITH YOUR NAUSEA MEDICATION . *UNUSUAL SHORTNESS OF BREATH . *UNUSUAL BRUISING OR BLEEDING . *URINARY PROBLEMS (pain or burning when urinating, or frequent urination) . *BOWEL PROBLEMS (unusual diarrhea, constipation, pain near the anus) . TENDERNESS IN MOUTH AND THROAT WITH OR WITHOUT PRESENCE OF ULCERS (sore throat, sores in mouth, or a toothache) . UNUSUAL RASH, SWELLING OR PAIN  . UNUSUAL VAGINAL DISCHARGE OR ITCHING   Items with * indicate a potential emergency and should be followed up as soon as possible or go to the Emergency Department if any problems should occur.  Please show the CHEMOTHERAPY ALERT CARD or  IMMUNOTHERAPY ALERT CARD at check-in to the Emergency Department and triage nurse.  Should you have questions after your visit or need to cancel or reschedule your appointment, please contact [facility]  Dept: [phone]  and follow the prompts.  Office hours are 8:00 a.m. to 4:30 p.m. Monday - Friday. Please note that voicemails left after 4:00 p.m. may not be returned until the following business day.  We are closed weekends and major holidays. You have access to a nurse at all times for urgent questions. Please call the main number to the clinic Dept: [phone] and follow the prompts.   For any non-urgent questions, you may also contact your provider using MyChart. We now offer e-Visits for anyone 15 and older to request care online for non-urgent symptoms. For details visit [email or web address].   Also download the MyChart app! Go to the app store, search "MyChart", open the app, select [facility], and log in with your MyChart username and password.  Due to Covid, a mask is required upon entering the hospital/clinic. If you do not have a mask, one will be given to you upon arrival. For doctor visits, patients may have 1 support person aged 77 or older with them. For treatment visits, patients cannot have anyone with them due to current Covid guidelines and our immunocompromised population.   Irinotecan injection What is this medicine? IRINOTECAN (ir in oh TEE kan ) is a chemotherapy drug. It is used to treat colon and rectal cancer. This medicine may be used for other purposes; ask your health care provider or  pharmacist if you have questions. COMMON BRAND NAME(S): Camptosar What should I tell my health care provider before I take this medicine? They need to know if you have any of these conditions:  dehydration  diarrhea  infection (especially a virus infection such as chickenpox, cold sores, or herpes)  liver disease  low blood counts, like low  white cell, platelet, or red cell counts  low levels of calcium, magnesium, or potassium in the blood  recent or ongoing radiation therapy  an unusual or allergic reaction to irinotecan, other medicines, foods, dyes, or preservatives  pregnant or trying to get pregnant  breast-feeding How should I use this medicine? This drug is given as an infusion into a vein. It is administered in a hospital or clinic by a specially trained health care professional. Talk to your pediatrician regarding the use of this medicine in children. Special care may be needed. Overdosage: If you think you have taken too much of this medicine contact a poison control center or emergency room at once. NOTE: This medicine is only for you. Do not share this medicine with others. What if I miss a dose? It is important not to miss your dose. Call your doctor or health care professional if you are unable to keep an appointment. What may interact with this medicine? Do not take this medicine with any of the following medications:  cobicistat  itraconazole This medicine may interact with the following medications:  antiviral medicines for HIV or AIDS  certain antibiotics like rifampin or rifabutin  certain medicines for fungal infections like ketoconazole, posaconazole, and voriconazole  certain medicines for seizures like carbamazepine, phenobarbital, phenotoin  clarithromycin  gemfibrozil  nefazodone  St. John's Wort This list may not describe all possible interactions. Give your health care provider a list of all the medicines, herbs, non-prescription drugs, or dietary supplements you use. Also tell them if you smoke, drink alcohol, or use illegal drugs. Some items may interact with your medicine. What should I watch for while using this medicine? Your condition will be monitored carefully while you are receiving this medicine. You will need important blood work done while you are taking this  medicine. This drug may make you feel generally unwell. This is not uncommon, as chemotherapy can affect healthy cells as well as cancer cells. Report any side effects. Continue your course of treatment even though you feel ill unless your doctor tells you to stop. In some cases, you may be given additional medicines to help with side effects. Follow all directions for their use. You may get drowsy or dizzy. Do not drive, use machinery, or do anything that needs mental alertness until you know how this medicine affects you. Do not stand or sit up quickly, especially if you are an older patient. This reduces the risk of dizzy or fainting spells. Call your health care professional for advice if you get a fever, chills, or sore throat, or other symptoms of a cold or flu. Do not treat yourself. This medicine decreases your body's ability to fight infections. Try to avoid being around people who are sick. Avoid taking products that contain aspirin, acetaminophen, ibuprofen, naproxen, or ketoprofen unless instructed by your doctor. These medicines may hide a fever. This medicine may increase your risk to bruise or bleed. Call your doctor or health care professional if you notice any unusual bleeding. Be careful brushing and flossing your teeth or using a toothpick because you may get an infection or bleed more easily. If  you have any dental work done, tell your dentist you are receiving this medicine. Do not become pregnant while taking this medicine or for 6 months after stopping it. Women should inform their health care professional if they wish to become pregnant or think they might be pregnant. Men should not father a child while taking this medicine and for 3 months after stopping it. There is potential for serious side effects to an unborn child. Talk to your health care professional for more information. Do not breast-feed an infant while taking this medicine or for 7 days after stopping it. This medicine  has caused ovarian failure in some women. This medicine may make it more difficult to get pregnant. Talk to your health care professional if you are concerned about your fertility. This medicine has caused decreased sperm counts in some men. This may make it more difficult to father a child. Talk to your health care professional if you are concerned about your fertility. What side effects may I notice from receiving this medicine? Side effects that you should report to your doctor or health care professional as soon as possible:  allergic reactions like skin rash, itching or hives, swelling of the face, lips, or tongue  chest pain  diarrhea  flushing, runny nose, sweating during infusion  low blood counts - this medicine may decrease the number of white blood cells, red blood cells and platelets. You may be at increased risk for infections and bleeding.  nausea, vomiting  pain, swelling, warmth in the leg  signs of decreased platelets or bleeding - bruising, pinpoint red spots on the skin, black, tarry stools, blood in the urine  signs of infection - fever or chills, cough, sore throat, pain or difficulty passing urine  signs of decreased red blood cells - unusually weak or tired, fainting spells, lightheadedness Side effects that usually do not require medical attention (report to your doctor or health care professional if they continue or are bothersome):  constipation  hair loss  headache  loss of appetite  mouth sores  stomach pain This list may not describe all possible side effects. Call your doctor for medical advice about side effects. You may report side effects to FDA at 1-800-FDA-1088. Where should I keep my medicine? This drug is given in a hospital or clinic and will not be stored at home. NOTE: This sheet is a summary. It may not cover all possible information. If you have questions about this medicine, talk to your doctor, pharmacist, or health care provider.   2021 Elsevier/Gold Standard (2018-11-30 17:46:13)  Leucovorin injection What is this medicine? LEUCOVORIN (loo koe VOR in) is used to prevent or treat the harmful effects of some medicines. This medicine is used to treat anemia caused by a low amount of folic acid in the body. It is also used with 5-fluorouracil (5-FU) to treat colon cancer. This medicine may be used for other purposes; ask your health care provider or pharmacist if you have questions. What should I tell my health care provider before I take this medicine? They need to know if you have any of these conditions: anemia from low levels of vitamin B-12 in the blood an unusual or allergic reaction to leucovorin, folic acid, other medicines, foods, dyes, or preservatives pregnant or trying to get pregnant breast-feeding How should I use this medicine? This medicine is for injection into a muscle or into a vein. It is given by a health care professional in a hospital or clinic setting.  Talk to your pediatrician regarding the use of this medicine in children. Special care may be needed. Overdosage: If you think you have taken too much of this medicine contact a poison control center or emergency room at once. NOTE: This medicine is only for you. Do not share this medicine with others. What if I miss a dose? This does not apply. What may interact with this medicine? capecitabine fluorouracil phenobarbital phenytoin primidone trimethoprim-sulfamethoxazole This list may not describe all possible interactions. Give your health care provider a list of all the medicines, herbs, non-prescription drugs, or dietary supplements you use. Also tell them if you smoke, drink alcohol, or use illegal drugs. Some items may interact with your medicine. What should I watch for while using this medicine? Your condition will be monitored carefully while you are receiving this medicine. This medicine may increase the side effects of 5-fluorouracil,  5-FU. Tell your doctor or health care professional if you have diarrhea or mouth sores that do not get better or that get worse. What side effects may I notice from receiving this medicine? Side effects that you should report to your doctor or health care professional as soon as possible: allergic reactions like skin rash, itching or hives, swelling of the face, lips, or tongue breathing problems fever, infection mouth sores unusual bleeding or bruising unusually weak or tired Side effects that usually do not require medical attention (report to your doctor or health care professional if they continue or are bothersome): constipation or diarrhea loss of appetite nausea, vomiting This list may not describe all possible side effects. Call your doctor for medical advice about side effects. You may report side effects to FDA at 1-800-FDA-1088. Where should I keep my medicine? This drug is given in a hospital or clinic and will not be stored at home. NOTE: This sheet is a summary. It may not cover all possible information. If you have questions about this medicine, talk to your doctor, pharmacist, or health care provider.  2021 Elsevier/Gold Standard (2007-07-06 16:50:29)  Fluorouracil, 5-FU injection What is this medicine? FLUOROURACIL, 5-FU (flure oh YOOR a sil) is a chemotherapy drug. It slows the growth of cancer cells. This medicine is used to treat many types of cancer like breast cancer, colon or rectal cancer, pancreatic cancer, and stomach cancer. This medicine may be used for other purposes; ask your health care provider or pharmacist if you have questions. COMMON BRAND NAME(S): Adrucil What should I tell my health care provider before I take this medicine? They need to know if you have any of these conditions:  blood disorders  dihydropyrimidine dehydrogenase (DPD) deficiency  infection (especially a virus infection such as chickenpox, cold sores, or herpes)  kidney  disease  liver disease  malnourished, poor nutrition  recent or ongoing radiation therapy  an unusual or allergic reaction to fluorouracil, other chemotherapy, other medicines, foods, dyes, or preservatives  pregnant or trying to get pregnant  breast-feeding How should I use this medicine? This drug is given as an infusion or injection into a vein. It is administered in a hospital or clinic by a specially trained health care professional. Talk to your pediatrician regarding the use of this medicine in children. Special care may be needed. Overdosage: If you think you have taken too much of this medicine contact a poison control center or emergency room at once. NOTE: This medicine is only for you. Do not share this medicine with others. What if I miss a dose? It is important  not to miss your dose. Call your doctor or health care professional if you are unable to keep an appointment. What may interact with this medicine? Do not take this medicine with any of the following medications:  live virus vaccines This medicine may also interact with the following medications:  medicines that treat or prevent blood clots like warfarin, enoxaparin, and dalteparin This list may not describe all possible interactions. Give your health care provider a list of all the medicines, herbs, non-prescription drugs, or dietary supplements you use. Also tell them if you smoke, drink alcohol, or use illegal drugs. Some items may interact with your medicine. What should I watch for while using this medicine? Visit your doctor for checks on your progress. This drug may make you feel generally unwell. This is not uncommon, as chemotherapy can affect healthy cells as well as cancer cells. Report any side effects. Continue your course of treatment even though you feel ill unless your doctor tells you to stop. In some cases, you may be given additional medicines to help with side effects. Follow all directions for  their use. Call your doctor or health care professional for advice if you get a fever, chills or sore throat, or other symptoms of a cold or flu. Do not treat yourself. This drug decreases your body's ability to fight infections. Try to avoid being around people who are sick. This medicine may increase your risk to bruise or bleed. Call your doctor or health care professional if you notice any unusual bleeding. Be careful brushing and flossing your teeth or using a toothpick because you may get an infection or bleed more easily. If you have any dental work done, tell your dentist you are receiving this medicine. Avoid taking products that contain aspirin, acetaminophen, ibuprofen, naproxen, or ketoprofen unless instructed by your doctor. These medicines may hide a fever. Do not become pregnant while taking this medicine. Women should inform their doctor if they wish to become pregnant or think they might be pregnant. There is a potential for serious side effects to an unborn child. Talk to your health care professional or pharmacist for more information. Do not breast-feed an infant while taking this medicine. Men should inform their doctor if they wish to father a child. This medicine may lower sperm counts. Do not treat diarrhea with over the counter products. Contact your doctor if you have diarrhea that lasts more than 2 days or if it is severe and watery. This medicine can make you more sensitive to the sun. Keep out of the sun. If you cannot avoid being in the sun, wear protective clothing and use sunscreen. Do not use sun lamps or tanning beds/booths. What side effects may I notice from receiving this medicine? Side effects that you should report to your doctor or health care professional as soon as possible:  allergic reactions like skin rash, itching or hives, swelling of the face, lips, or tongue  low blood counts - this medicine may decrease the number of white blood cells, red blood cells  and platelets. You may be at increased risk for infections and bleeding.  signs of infection - fever or chills, cough, sore throat, pain or difficulty passing urine  signs of decreased platelets or bleeding - bruising, pinpoint red spots on the skin, black, tarry stools, blood in the urine  signs of decreased red blood cells - unusually weak or tired, fainting spells, lightheadedness  breathing problems  changes in vision  chest pain  mouth  sores  nausea and vomiting  pain, swelling, redness at site where injected  pain, tingling, numbness in the hands or feet  redness, swelling, or sores on hands or feet  stomach pain  unusual bleeding Side effects that usually do not require medical attention (report to your doctor or health care professional if they continue or are bothersome):  changes in finger or toe nails  diarrhea  dry or itchy skin  hair loss  headache  loss of appetite  sensitivity of eyes to the light  stomach upset  unusually teary eyes This list may not describe all possible side effects. Call your doctor for medical advice about side effects. You may report side effects to FDA at 1-800-FDA-1088. Where should I keep my medicine? This drug is given in a hospital or clinic and will not be stored at home. NOTE: This sheet is a summary. It may not cover all possible information. If you have questions about this medicine, talk to your doctor, pharmacist, or health care provider.  2021 Elsevier/Gold Standard (2018-11-30 15:00:03)

## 2020-05-15 NOTE — Progress Notes (Signed)
Dierdre Harness Fariss was here throughout infusion as ASL interpretor

## 2020-05-17 ENCOUNTER — Other Ambulatory Visit: Payer: Self-pay

## 2020-05-17 ENCOUNTER — Inpatient Hospital Stay: Payer: Medicare Other

## 2020-05-17 VITALS — BP 130/79 | HR 74 | Temp 98.5°F | Resp 18

## 2020-05-17 DIAGNOSIS — T451X5A Adverse effect of antineoplastic and immunosuppressive drugs, initial encounter: Secondary | ICD-10-CM | POA: Diagnosis not present

## 2020-05-17 DIAGNOSIS — C182 Malignant neoplasm of ascending colon: Secondary | ICD-10-CM | POA: Diagnosis not present

## 2020-05-17 DIAGNOSIS — C7989 Secondary malignant neoplasm of other specified sites: Secondary | ICD-10-CM | POA: Diagnosis not present

## 2020-05-17 DIAGNOSIS — C779 Secondary and unspecified malignant neoplasm of lymph node, unspecified: Secondary | ICD-10-CM | POA: Diagnosis not present

## 2020-05-17 DIAGNOSIS — D701 Agranulocytosis secondary to cancer chemotherapy: Secondary | ICD-10-CM | POA: Diagnosis not present

## 2020-05-17 DIAGNOSIS — C189 Malignant neoplasm of colon, unspecified: Secondary | ICD-10-CM

## 2020-05-17 DIAGNOSIS — Z5111 Encounter for antineoplastic chemotherapy: Secondary | ICD-10-CM | POA: Diagnosis not present

## 2020-05-17 MED ORDER — HEPARIN SOD (PORK) LOCK FLUSH 100 UNIT/ML IV SOLN
500.0000 [IU] | Freq: Once | INTRAVENOUS | Status: AC | PRN
  Administered 2020-05-17: 500 [IU]
  Filled 2020-05-17: qty 5

## 2020-05-17 MED ORDER — PEGFILGRASTIM-CBQV 6 MG/0.6ML ~~LOC~~ SOSY
6.0000 mg | PREFILLED_SYRINGE | Freq: Once | SUBCUTANEOUS | Status: AC
  Administered 2020-05-17: 6 mg via SUBCUTANEOUS

## 2020-05-17 MED ORDER — SODIUM CHLORIDE 0.9% FLUSH
10.0000 mL | INTRAVENOUS | Status: DC | PRN
  Administered 2020-05-17: 10 mL
  Filled 2020-05-17: qty 10

## 2020-05-17 NOTE — Patient Instructions (Signed)
[facility]  Discharge Instructions: Thank you for choosing [facility] to provide your oncology and hematology care.   If you have a lab appointment with the [facility], please go directly to the [facility] and check in at the registration area.   Wear comfortable clothing and clothing appropriate for easy access to any Portacath or PICC line.   We strive to give you quality time with your provider. You may need to reschedule your appointment if you arrive late (15 or more minutes).  Arriving late affects you and other patients whose appointments are after yours.  Also, if you miss three or more appointments without notifying the office, you may be dismissed from the clinic at the provider's discretion.      For prescription refill requests, have your pharmacy contact our office and allow 72 hours for refills to be completed.    Today you were disconnected from the 91fu pump.    To help prevent nausea and vomiting after your treatment, we encourage you to take your nausea medication as directed.  BELOW ARE SYMPTOMS THAT SHOULD BE REPORTED IMMEDIATELY: . *FEVER GREATER THAN 100.4 F (38 C) OR HIGHER . *CHILLS OR SWEATING . *NAUSEA AND VOMITING THAT IS NOT CONTROLLED WITH YOUR NAUSEA MEDICATION . *UNUSUAL SHORTNESS OF BREATH . *UNUSUAL BRUISING OR BLEEDING . *URINARY PROBLEMS (pain or burning when urinating, or frequent urination) . *BOWEL PROBLEMS (unusual diarrhea, constipation, pain near the anus) . TENDERNESS IN MOUTH AND THROAT WITH OR WITHOUT PRESENCE OF ULCERS (sore throat, sores in mouth, or a toothache) . UNUSUAL RASH, SWELLING OR PAIN  . UNUSUAL VAGINAL DISCHARGE OR ITCHING   Items with * indicate a potential emergency and should be followed up as soon as possible or go to the Emergency Department if any problems should occur.  Please show the CHEMOTHERAPY ALERT CARD or IMMUNOTHERAPY ALERT CARD at check-in to the Emergency  Department and triage nurse.  Should you have questions after your visit or need to cancel or reschedule your appointment, please contact [facility]  Dept: [phone]  and follow the prompts.  Office hours are 8:00 a.m. to 4:30 p.m. Monday - Friday. Please note that voicemails left after 4:00 p.m. may not be returned until the following business day.  We are closed weekends and major holidays. You have access to a nurse at all times for urgent questions. Please call the main number to the clinic Dept: [phone] and follow the prompts.   For any non-urgent questions, you may also contact your provider using MyChart. We now offer e-Visits for anyone 10 and older to request care online for non-urgent symptoms. For details visit [email or web address].   Also download the MyChart app! Go to the app store, search "MyChart", open the app, select [facility], and log in with your MyChart username and password.  Due to Covid, a mask is required upon entering the hospital/clinic. If you do not have a mask, one will be given to you upon arrival. For doctor visits, patients may have 1 support person aged 64 or older with them. For treatment visits, patients cannot have anyone with them due to current Covid guidelines and our immunocompromised population.   Fluorouracil, 5-FU injection What is this medicine? FLUOROURACIL, 5-FU (flure oh YOOR a sil) is a chemotherapy drug. It slows the growth of cancer cells. This medicine is used to treat many types of cancer like breast cancer, colon or rectal cancer, pancreatic  cancer, and stomach cancer. This medicine may be used for other purposes; ask your health care provider or pharmacist if you have questions. COMMON BRAND NAME(S): Adrucil What should I tell my health care provider before I take this medicine? They need to know if you have any of these conditions:  blood disorders  dihydropyrimidine dehydrogenase (DPD)  deficiency  infection (especially a virus infection such as chickenpox, cold sores, or herpes)  kidney disease  liver disease  malnourished, poor nutrition  recent or ongoing radiation therapy  an unusual or allergic reaction to fluorouracil, other chemotherapy, other medicines, foods, dyes, or preservatives  pregnant or trying to get pregnant  breast-feeding How should I use this medicine? This drug is given as an infusion or injection into a vein. It is administered in a hospital or clinic by a specially trained health care professional. Talk to your pediatrician regarding the use of this medicine in children. Special care may be needed. Overdosage: If you think you have taken too much of this medicine contact a poison control center or emergency room at once. NOTE: This medicine is only for you. Do not share this medicine with others. What if I miss a dose? It is important not to miss your dose. Call your doctor or health care professional if you are unable to keep an appointment. What may interact with this medicine? Do not take this medicine with any of the following medications:  live virus vaccines This medicine may also interact with the following medications:  medicines that treat or prevent blood clots like warfarin, enoxaparin, and dalteparin This list may not describe all possible interactions. Give your health care provider a list of all the medicines, herbs, non-prescription drugs, or dietary supplements you use. Also tell them if you smoke, drink alcohol, or use illegal drugs. Some items may interact with your medicine. What should I watch for while using this medicine? Visit your doctor for checks on your progress. This drug may make you feel generally unwell. This is not uncommon, as chemotherapy can affect healthy cells as well as cancer cells. Report any side effects. Continue your course of treatment even though you feel ill unless your doctor tells you to  stop. In some cases, you may be given additional medicines to help with side effects. Follow all directions for their use. Call your doctor or health care professional for advice if you get a fever, chills or sore throat, or other symptoms of a cold or flu. Do not treat yourself. This drug decreases your body's ability to fight infections. Try to avoid being around people who are sick. This medicine may increase your risk to bruise or bleed. Call your doctor or health care professional if you notice any unusual bleeding. Be careful brushing and flossing your teeth or using a toothpick because you may get an infection or bleed more easily. If you have any dental work done, tell your dentist you are receiving this medicine. Avoid taking products that contain aspirin, acetaminophen, ibuprofen, naproxen, or ketoprofen unless instructed by your doctor. These medicines may hide a fever. Do not become pregnant while taking this medicine. Women should inform their doctor if they wish to become pregnant or think they might be pregnant. There is a potential for serious side effects to an unborn child. Talk to your health care professional or pharmacist for more information. Do not breast-feed an infant while taking this medicine. Men should inform their doctor if they wish to father a child. This medicine  may lower sperm counts. Do not treat diarrhea with over the counter products. Contact your doctor if you have diarrhea that lasts more than 2 days or if it is severe and watery. This medicine can make you more sensitive to the sun. Keep out of the sun. If you cannot avoid being in the sun, wear protective clothing and use sunscreen. Do not use sun lamps or tanning beds/booths. What side effects may I notice from receiving this medicine? Side effects that you should report to your doctor or health care professional as soon as possible:  allergic reactions like skin rash, itching or hives, swelling of the face,  lips, or tongue  low blood counts - this medicine may decrease the number of white blood cells, red blood cells and platelets. You may be at increased risk for infections and bleeding.  signs of infection - fever or chills, cough, sore throat, pain or difficulty passing urine  signs of decreased platelets or bleeding - bruising, pinpoint red spots on the skin, black, tarry stools, blood in the urine  signs of decreased red blood cells - unusually weak or tired, fainting spells, lightheadedness  breathing problems  changes in vision  chest pain  mouth sores  nausea and vomiting  pain, swelling, redness at site where injected  pain, tingling, numbness in the hands or feet  redness, swelling, or sores on hands or feet  stomach pain  unusual bleeding Side effects that usually do not require medical attention (report to your doctor or health care professional if they continue or are bothersome):  changes in finger or toe nails  diarrhea  dry or itchy skin  hair loss  headache  loss of appetite  sensitivity of eyes to the light  stomach upset  unusually teary eyes This list may not describe all possible side effects. Call your doctor for medical advice about side effects. You may report side effects to FDA at 1-800-FDA-1088. Where should I keep my medicine? This drug is given in a hospital or clinic and will not be stored at home. NOTE: This sheet is a summary. It may not cover all possible information. If you have questions about this medicine, talk to your doctor, pharmacist, or health care provider.  2021 Elsevier/Gold Standard (2018-11-30 15:00:03)

## 2020-05-17 NOTE — Progress Notes (Signed)
Interpreter at bedside. Amy Green NCITL [id number].

## 2020-05-27 ENCOUNTER — Other Ambulatory Visit: Payer: Self-pay | Admitting: Oncology

## 2020-05-28 ENCOUNTER — Inpatient Hospital Stay (HOSPITAL_BASED_OUTPATIENT_CLINIC_OR_DEPARTMENT_OTHER): Payer: Medicare Other | Admitting: Nurse Practitioner

## 2020-05-28 ENCOUNTER — Inpatient Hospital Stay: Payer: Medicare Other

## 2020-05-28 ENCOUNTER — Other Ambulatory Visit: Payer: Self-pay

## 2020-05-28 ENCOUNTER — Encounter: Payer: Self-pay | Admitting: Nurse Practitioner

## 2020-05-28 VITALS — BP 126/80 | HR 96 | Temp 98.0°F | Resp 18 | Ht 70.0 in | Wt 151.6 lb

## 2020-05-28 DIAGNOSIS — T451X5A Adverse effect of antineoplastic and immunosuppressive drugs, initial encounter: Secondary | ICD-10-CM | POA: Diagnosis not present

## 2020-05-28 DIAGNOSIS — C182 Malignant neoplasm of ascending colon: Secondary | ICD-10-CM

## 2020-05-28 DIAGNOSIS — C779 Secondary and unspecified malignant neoplasm of lymph node, unspecified: Secondary | ICD-10-CM | POA: Diagnosis not present

## 2020-05-28 DIAGNOSIS — C7989 Secondary malignant neoplasm of other specified sites: Secondary | ICD-10-CM | POA: Diagnosis not present

## 2020-05-28 DIAGNOSIS — Z95828 Presence of other vascular implants and grafts: Secondary | ICD-10-CM

## 2020-05-28 DIAGNOSIS — Z5111 Encounter for antineoplastic chemotherapy: Secondary | ICD-10-CM | POA: Diagnosis not present

## 2020-05-28 DIAGNOSIS — D701 Agranulocytosis secondary to cancer chemotherapy: Secondary | ICD-10-CM | POA: Diagnosis not present

## 2020-05-28 LAB — CMP (CANCER CENTER ONLY)
ALT: 11 U/L (ref 0–44)
AST: 13 U/L — ABNORMAL LOW (ref 15–41)
Albumin: 4.2 g/dL (ref 3.5–5.0)
Alkaline Phosphatase: 86 U/L (ref 38–126)
Anion gap: 9 (ref 5–15)
BUN: 13 mg/dL (ref 6–20)
CO2: 25 mmol/L (ref 22–32)
Calcium: 9.1 mg/dL (ref 8.9–10.3)
Chloride: 107 mmol/L (ref 98–111)
Creatinine: 1.31 mg/dL — ABNORMAL HIGH (ref 0.61–1.24)
GFR, Estimated: >60 mL/min (ref >60)
Glucose, Bld: 104 mg/dL — ABNORMAL HIGH (ref 70–99)
Potassium: 4.1 mmol/L (ref 3.5–5.1)
Sodium: 141 mmol/L (ref 135–145)
Total Bilirubin: 0.3 mg/dL (ref 0.3–1.2)
Total Protein: 6.9 g/dL (ref 6.5–8.1)

## 2020-05-28 LAB — CBC WITH DIFFERENTIAL (CANCER CENTER ONLY)
Abs Immature Granulocytes: 0.52 10*3/uL — ABNORMAL HIGH (ref 0.00–0.07)
Basophils Absolute: 0.1 10*3/uL (ref 0.0–0.1)
Basophils Relative: 1 %
Eosinophils Absolute: 0.1 10*3/uL (ref 0.0–0.5)
Eosinophils Relative: 2 %
HCT: 37.7 % — ABNORMAL LOW (ref 39.0–52.0)
Hemoglobin: 11.3 g/dL — ABNORMAL LOW (ref 13.0–17.0)
Immature Granulocytes: 8 %
Lymphocytes Relative: 32 %
Lymphs Abs: 2 10*3/uL (ref 0.7–4.0)
MCH: 24.7 pg — ABNORMAL LOW (ref 26.0–34.0)
MCHC: 30 g/dL (ref 30.0–36.0)
MCV: 82.5 fL (ref 80.0–100.0)
Monocytes Absolute: 0.5 10*3/uL (ref 0.1–1.0)
Monocytes Relative: 8 %
Neutro Abs: 3.1 10*3/uL (ref 1.7–7.7)
Neutrophils Relative %: 49 %
Platelet Count: 198 10*3/uL (ref 150–400)
RBC: 4.57 MIL/uL (ref 4.22–5.81)
RDW: 19.9 % — ABNORMAL HIGH (ref 11.5–15.5)
WBC Count: 6.3 10*3/uL (ref 4.0–10.5)
nRBC: 0.9 % — ABNORMAL HIGH (ref 0.0–0.2)

## 2020-05-28 MED ORDER — HEPARIN SOD (PORK) LOCK FLUSH 100 UNIT/ML IV SOLN
500.0000 [IU] | Freq: Once | INTRAVENOUS | Status: AC | PRN
  Administered 2020-05-28: 500 [IU]
  Filled 2020-05-28: qty 5

## 2020-05-28 MED ORDER — SODIUM CHLORIDE 0.9% FLUSH
10.0000 mL | INTRAVENOUS | Status: DC | PRN
  Administered 2020-05-28: 10 mL
  Filled 2020-05-28: qty 10

## 2020-05-28 NOTE — Progress Notes (Signed)
This day pt given 1 box of ensure

## 2020-05-28 NOTE — Progress Notes (Addendum)
[facility] OFFICE PROGRESS NOTE   Diagnosis: Colon cancer  INTERVAL HISTORY:   Alexander Reeves returns as scheduled.  He completed cycle 11 FOLFIRI 05/15/2020.  He had a single episode of nausea/vomiting which he attributes to something he ate.  No mouth sores.  No diarrhea.  He continues to have intermittent back pain.  This does not occur on a daily basis and tends to happen when he is active.  Objective:  Vital signs in last 24 hours:  Blood pressure 126/80, pulse 96, temperature 98 F (36.7 C), temperature source Oral, resp. rate 18, height _0  (1.778 m), weight 151 lb 9.6 oz (68.8 kg), SpO2 100 %.    HEENT: No thrush or ulcers. Resp: Lungs clear bilaterally. Cardio: Regular rate and rhythm. GI: Abdomen soft and nontender.  No hepatomegaly. Vascular: No leg edema.  Skin: Palms without erythema. Port-A-Cath without erythema.   Lab Results:  Lab Results  Component Value Date   WBC 6.3 05/28/2020   HGB 11.3 (L) 05/28/2020   HCT 37.7 (L) 05/28/2020   MCV 82.5 05/28/2020   PLT 198 05/28/2020   NEUTROABS 3.1 05/28/2020    Imaging:  No results found.  Medications: I have reviewed the patient's current medications.  Assessment/Plan: 1.Moderately differentiated adenocarcinoma ascending colon, stage IIIb (pT3pN1), status post a right colectomy 11/19/2018 ? Lymphovascular and perineural invasion present, 2/14 lymph nodes positive, tumor deposits present ? Positive radial margin, no loss of mismatch repair protein expression, discussed case with Dr. Craig Reeves mass with surrounding inflammation at multiple margins, no gross residual disease, unclear which is the "positive "radial margin, further surgery and radiation not recommended ? Colonoscopy 11/19/2018-completely obstructing mid ascending colon mass, could not be passed with endoscope, biopsy confirmed invasive adenocarcinoma ? CT abdomen/pelvis 11/18/2018-wall thickening at the mid and distal  ascending colon with mild distention of the proximal ascending colon and cecum ? CTs 10/17/2018--no acute findings, no chest lymphadenopathy, lungs clear ? Cycle 1 FOLFOX 12/28/2018 ? Cycle 2 FOLFOX 01/11/2019, Udenyca added ? Cycle 3 FOLFOX 01/25/2019,Udenyca held due to bone pain ? Cycle 4 FOLFOX 02/21/2019, Udenyca added ? Cycle 5 FOLFOX 03/09/2019, Udenyca ? Cycle 6 FOLFOX 03/21/2019, Udenyca ? Cycle 7 FOLFOX 04/04/2019, Udenyca ? Cycle 8 FOLFOX 04/18/2019, Udenyca ? Cycle 9 FOLFOX 05/02/2019, Udenyca ? Cycle 10 FOLFOX 05/16/2019, oxaliplatin, 5-FU bolus, and Udenyca held ? Cycle 11 FOLFOX 05/30/2019, oxaliplatin, 5-FU bolus, and Udenyca held ? Cycle 12 FOLFOX 06/15/2019, oxaliplatin, 5-FU bolus and Udenyca held ? CTs 09/29/2019-new hypodense enhancing liver masses consistent with metastatic disease, indistinct marginated nodularity below the pancreas head-likely small lymph nodes ? Biopsy liver lesion 10/18/2019-adenocarcinoma consistent with history of colorectal carcinoma ? Cycle 1 FOLFIRI/Avastin 11/01/2019 ? Cycle 2 FOLFIRI/Avastin 11/15/2019 ? Cycle 3 FOLFIRI/Avastin 11/29/2019 ? Cycle 4 FOLFIRI/Avastin 12/13/2019 ? CT chest 12/27/2019-no significant change in hepatic metastases, submassive pulmonary emboli ? Cycle 5 FOLFIRI 01/10/2020 (Avastin discontinued) ? Cycle 6 FOLFIRI 02/01/2020 ? Cycle 7 FOLFIRI 02/13/2020 ? Cycle8FOLFIRI 02/27/2020 ? CTs abdomen/pelvis 03/09/2020-per Dr. Gearldine Reeves review overall stable disease ? Cycle 9 FOLFIRI 04/16/2020 ? Cycle 10 FOLFIRI 05/01/2020 ? Cycle 11 FOLFIRI 05/15/2020 ? Cycle 12 FOLFIRI 05/29/2020  2. Deaf  3. Right epididymal cyst removal 03/22/2018 4. Asthma 5. Port-A-Cath placement, [name], 12/23/2018 6. Neutropenia secondary to chemotherapy-Udenyca added for cycle 2 FOLFOX 7. Admission with febrile neutropenia 02/08/2019 8. Hospital admission with submassive PEand left lower extremity DVTon 12/27/2019, heparin, transition to Xarelto  12/28/2019  CT chest 12/27/2019-submassive pulmonary emboli with evidence of  right heart strain, no significant change in hepatic metastases   Disposition: [name] appears stable.  He has completed 11 cycles of FOLFIRI.  Plan to proceed with cycle 12 as scheduled 05/29/2020 pending lab results from today.  Restaging CTs prior to next office visit.  He will return for cycle 12 FOLFIRI tomorrow.  CT scans around 06/07/2020.  Lab, follow-up, office visit on 06/12/2020.  He will contact the office in the interim with any problems.  A sign language interpreter was present throughout today's visit.   Ned Card ANP/GNP-BC   05/28/2020  12:00 PM

## 2020-05-29 ENCOUNTER — Inpatient Hospital Stay: Payer: Medicare Other

## 2020-05-29 ENCOUNTER — Encounter: Payer: Self-pay | Admitting: General Practice

## 2020-05-29 VITALS — BP 104/79 | HR 85 | Temp 97.6°F | Resp 18

## 2020-05-29 DIAGNOSIS — C7989 Secondary malignant neoplasm of other specified sites: Secondary | ICD-10-CM | POA: Diagnosis not present

## 2020-05-29 DIAGNOSIS — C182 Malignant neoplasm of ascending colon: Secondary | ICD-10-CM

## 2020-05-29 DIAGNOSIS — C779 Secondary and unspecified malignant neoplasm of lymph node, unspecified: Secondary | ICD-10-CM | POA: Diagnosis not present

## 2020-05-29 DIAGNOSIS — C189 Malignant neoplasm of colon, unspecified: Secondary | ICD-10-CM

## 2020-05-29 DIAGNOSIS — D701 Agranulocytosis secondary to cancer chemotherapy: Secondary | ICD-10-CM | POA: Diagnosis not present

## 2020-05-29 DIAGNOSIS — Z5111 Encounter for antineoplastic chemotherapy: Secondary | ICD-10-CM | POA: Diagnosis not present

## 2020-05-29 DIAGNOSIS — T451X5A Adverse effect of antineoplastic and immunosuppressive drugs, initial encounter: Secondary | ICD-10-CM | POA: Diagnosis not present

## 2020-05-29 MED ORDER — SODIUM CHLORIDE 0.9 % IV SOLN
2400.0000 mg/m2 | INTRAVENOUS | Status: DC
  Administered 2020-05-29: 4300 mg via INTRAVENOUS
  Filled 2020-05-29: qty 86

## 2020-05-29 MED ORDER — FLUOROURACIL CHEMO INJECTION 2.5 GM/50ML
400.0000 mg/m2 | Freq: Once | INTRAVENOUS | Status: AC
  Administered 2020-05-29: 700 mg via INTRAVENOUS
  Filled 2020-05-29: qty 14

## 2020-05-29 MED ORDER — ATROPINE SULFATE 1 MG/ML IJ SOLN
0.5000 mg | Freq: Once | INTRAMUSCULAR | Status: AC | PRN
  Administered 2020-05-29: 0.5 mg via INTRAVENOUS
  Filled 2020-05-29: qty 1

## 2020-05-29 MED ORDER — SODIUM CHLORIDE 0.9 % IV SOLN
400.0000 mg/m2 | Freq: Once | INTRAVENOUS | Status: AC
  Administered 2020-05-29: 716 mg via INTRAVENOUS
  Filled 2020-05-29: qty 35.8

## 2020-05-29 MED ORDER — SODIUM CHLORIDE 0.9% FLUSH
10.0000 mL | INTRAVENOUS | Status: DC | PRN
  Administered 2020-05-29: 10 mL
  Filled 2020-05-29: qty 10

## 2020-05-29 MED ORDER — PALONOSETRON HCL INJECTION 0.25 MG/5ML
0.2500 mg | Freq: Once | INTRAVENOUS | Status: AC
  Administered 2020-05-29: 0.25 mg via INTRAVENOUS
  Filled 2020-05-29: qty 5

## 2020-05-29 MED ORDER — SODIUM CHLORIDE 0.9 % IV SOLN
Freq: Once | INTRAVENOUS | Status: AC
Start: 2020-05-29 — End: 2020-05-29
  Filled 2020-05-29: qty 250

## 2020-05-29 MED ORDER — SODIUM CHLORIDE 0.9 % IV SOLN
180.0000 mg/m2 | Freq: Once | INTRAVENOUS | Status: AC
  Administered 2020-05-29: 320 mg via INTRAVENOUS
  Filled 2020-05-29: qty 15

## 2020-05-29 MED ORDER — SODIUM CHLORIDE 0.9 % IV SOLN
10.0000 mg | Freq: Once | INTRAVENOUS | Status: AC
  Administered 2020-05-29: 10 mg via INTRAVENOUS
  Filled 2020-05-29: qty 1

## 2020-05-29 NOTE — Progress Notes (Signed)
[facility] Spiritual Care Note  With the assistance of an ASL Soil scientist) interpreter with him in the treatment room at [facility], Mr Seeley and I met via Webex to discuss Advance Directives. Reviewed the forms with him, and he completed a draft, which he plans to take home to discuss with his wife, whom he chooses as his first healthcare agent. (Second choice is oldest daughter.) We plan to follow up via video phone with ASL interpreter to schedule an appointment at [facility] for him to have the forms notarized.   [place], [place], [place] [facility] Pager [phone] Voicemail [phone]

## 2020-05-29 NOTE — Patient Instructions (Signed)
[facility]  Discharge Instructions: Thank you for choosing [facility] to provide your oncology and hematology care.   If you have a lab appointment with the Cancer Center, please go directly to the Cancer Center and check in at the registration area.   Wear comfortable clothing and clothing appropriate for easy access to any Portacath or PICC line.   We strive to give you quality time with your provider. You may need to reschedule your appointment if you arrive late (15 or more minutes).  Arriving late affects you and other patients whose appointments are after yours.  Also, if you miss three or more appointments without notifying the office, you may be dismissed from the clinic at the provider's discretion.      For prescription refill requests, have your pharmacy contact our office and allow 72 hours for refills to be completed.    Today you received the following chemotherapy and/or immunotherapy agents irinotecan, leucovorin, fluorouracil    To help prevent nausea and vomiting after your treatment, we encourage you to take your nausea medication as directed.  BELOW ARE SYMPTOMS THAT SHOULD BE REPORTED IMMEDIATELY: . *FEVER GREATER THAN 100.4 F (38 C) OR HIGHER . *CHILLS OR SWEATING . *NAUSEA AND VOMITING THAT IS NOT CONTROLLED WITH YOUR NAUSEA MEDICATION . *UNUSUAL SHORTNESS OF BREATH . *UNUSUAL BRUISING OR BLEEDING . *URINARY PROBLEMS (pain or burning when urinating, or frequent urination) . *BOWEL PROBLEMS (unusual diarrhea, constipation, pain near the anus) . TENDERNESS IN MOUTH AND THROAT WITH OR WITHOUT PRESENCE OF ULCERS (sore throat, sores in mouth, or a toothache) . UNUSUAL RASH, SWELLING OR PAIN  . UNUSUAL VAGINAL DISCHARGE OR ITCHING   Items with * indicate a potential emergency and should be followed up as soon as possible or go to the Emergency Department if any problems should occur.  Please show the CHEMOTHERAPY ALERT CARD  or IMMUNOTHERAPY ALERT CARD at check-in to the Emergency Department and triage nurse.  Should you have questions after your visit or need to cancel or reschedule your appointment, please contact [facility]  Dept: [phone]  and follow the prompts.  Office hours are 8:00 a.m. to 4:30 p.m. Monday - Friday. Please note that voicemails left after 4:00 p.m. may not be returned until the following business day.  We are closed weekends and major holidays. You have access to a nurse at all times for urgent questions. Please call the main number to the clinic Dept: [phone] and follow the prompts.   For any non-urgent questions, you may also contact your provider using MyChart. We now offer e-Visits for anyone 18 and older to request care online for non-urgent symptoms. For details visit [email or web address].   Also download the MyChart app! Go to the app store, search "MyChart", open the app, select [facility], and log in with your MyChart username and password.  Due to Covid, a mask is required upon entering the hospital/clinic. If you do not have a mask, one will be given to you upon arrival. For doctor visits, patients may have 1 support person aged 18 or older with them. For treatment visits, patients cannot have anyone with them due to current Covid guidelines and our immunocompromised population.   Irinotecan injection What is this medicine? IRINOTECAN (ir in oh TEE kan ) is a chemotherapy drug. It is used to treat colon and rectal cancer. This medicine may be used for other purposes; ask your health care provider   or pharmacist if you have questions. COMMON BRAND NAME(S): Camptosar What should I tell my health care provider before I take this medicine? They need to know if you have any of these conditions:  dehydration  diarrhea  infection (especially a virus infection such as chickenpox, cold sores, or herpes)  liver disease  low blood counts, like low  white cell, platelet, or red cell counts  low levels of calcium, magnesium, or potassium in the blood  recent or ongoing radiation therapy  an unusual or allergic reaction to irinotecan, other medicines, foods, dyes, or preservatives  pregnant or trying to get pregnant  breast-feeding How should I use this medicine? This drug is given as an infusion into a vein. It is administered in a hospital or clinic by a specially trained health care professional. Talk to your pediatrician regarding the use of this medicine in children. Special care may be needed. Overdosage: If you think you have taken too much of this medicine contact a poison control center or emergency room at once. NOTE: This medicine is only for you. Do not share this medicine with others. What if I miss a dose? It is important not to miss your dose. Call your doctor or health care professional if you are unable to keep an appointment. What may interact with this medicine? Do not take this medicine with any of the following medications:  cobicistat  itraconazole This medicine may interact with the following medications:  antiviral medicines for HIV or AIDS  certain antibiotics like rifampin or rifabutin  certain medicines for fungal infections like ketoconazole, posaconazole, and voriconazole  certain medicines for seizures like carbamazepine, phenobarbital, phenotoin  clarithromycin  gemfibrozil  nefazodone  St. John's Wort This list may not describe all possible interactions. Give your health care provider a list of all the medicines, herbs, non-prescription drugs, or dietary supplements you use. Also tell them if you smoke, drink alcohol, or use illegal drugs. Some items may interact with your medicine. What should I watch for while using this medicine? Your condition will be monitored carefully while you are receiving this medicine. You will need important blood work done while you are taking this  medicine. This drug may make you feel generally unwell. This is not uncommon, as chemotherapy can affect healthy cells as well as cancer cells. Report any side effects. Continue your course of treatment even though you feel ill unless your doctor tells you to stop. In some cases, you may be given additional medicines to help with side effects. Follow all directions for their use. You may get drowsy or dizzy. Do not drive, use machinery, or do anything that needs mental alertness until you know how this medicine affects you. Do not stand or sit up quickly, especially if you are an older patient. This reduces the risk of dizzy or fainting spells. Call your health care professional for advice if you get a fever, chills, or sore throat, or other symptoms of a cold or flu. Do not treat yourself. This medicine decreases your body's ability to fight infections. Try to avoid being around people who are sick. Avoid taking products that contain aspirin, acetaminophen, ibuprofen, naproxen, or ketoprofen unless instructed by your doctor. These medicines may hide a fever. This medicine may increase your risk to bruise or bleed. Call your doctor or health care professional if you notice any unusual bleeding. Be careful brushing and flossing your teeth or using a toothpick because you may get an infection or bleed more easily.   If you have any dental work done, tell your dentist you are receiving this medicine. Do not become pregnant while taking this medicine or for 6 months after stopping it. Women should inform their health care professional if they wish to become pregnant or think they might be pregnant. Men should not father a child while taking this medicine and for 3 months after stopping it. There is potential for serious side effects to an unborn child. Talk to your health care professional for more information. Do not breast-feed an infant while taking this medicine or for 7 days after stopping it. This medicine  has caused ovarian failure in some women. This medicine may make it more difficult to get pregnant. Talk to your health care professional if you are concerned about your fertility. This medicine has caused decreased sperm counts in some men. This may make it more difficult to father a child. Talk to your health care professional if you are concerned about your fertility. What side effects may I notice from receiving this medicine? Side effects that you should report to your doctor or health care professional as soon as possible:  allergic reactions like skin rash, itching or hives, swelling of the face, lips, or tongue  chest pain  diarrhea  flushing, runny nose, sweating during infusion  low blood counts - this medicine may decrease the number of white blood cells, red blood cells and platelets. You may be at increased risk for infections and bleeding.  nausea, vomiting  pain, swelling, warmth in the leg  signs of decreased platelets or bleeding - bruising, pinpoint red spots on the skin, black, tarry stools, blood in the urine  signs of infection - fever or chills, cough, sore throat, pain or difficulty passing urine  signs of decreased red blood cells - unusually weak or tired, fainting spells, lightheadedness Side effects that usually do not require medical attention (report to your doctor or health care professional if they continue or are bothersome):  constipation  hair loss  headache  loss of appetite  mouth sores  stomach pain This list may not describe all possible side effects. Call your doctor for medical advice about side effects. You may report side effects to FDA at 1-800-FDA-1088. Where should I keep my medicine? This drug is given in a hospital or clinic and will not be stored at home. NOTE: This sheet is a summary. It may not cover all possible information. If you have questions about this medicine, talk to your doctor, pharmacist, or health care provider.   2021 Elsevier/Gold Standard (2018-11-30 17:46:13)  Leucovorin injection What is this medicine? LEUCOVORIN (loo koe VOR in) is used to prevent or treat the harmful effects of some medicines. This medicine is used to treat anemia caused by a low amount of folic acid in the body. It is also used with 5-fluorouracil (5-FU) to treat colon cancer. This medicine may be used for other purposes; ask your health care provider or pharmacist if you have questions. What should I tell my health care provider before I take this medicine? They need to know if you have any of these conditions:  anemia from low levels of vitamin B-12 in the blood  an unusual or allergic reaction to leucovorin, folic acid, other medicines, foods, dyes, or preservatives  pregnant or trying to get pregnant  breast-feeding How should I use this medicine? This medicine is for injection into a muscle or into a vein. It is given by a health care professional in   a hospital or clinic setting. Talk to your pediatrician regarding the use of this medicine in children. Special care may be needed. Overdosage: If you think you have taken too much of this medicine contact a poison control center or emergency room at once. NOTE: This medicine is only for you. Do not share this medicine with others. What if I miss a dose? This does not apply. What may interact with this medicine?  capecitabine  fluorouracil  phenobarbital  phenytoin  primidone  trimethoprim-sulfamethoxazole This list may not describe all possible interactions. Give your health care provider a list of all the medicines, herbs, non-prescription drugs, or dietary supplements you use. Also tell them if you smoke, drink alcohol, or use illegal drugs. Some items may interact with your medicine. What should I watch for while using this medicine? Your condition will be monitored carefully while you are receiving this medicine. This medicine may increase the side effects  of 5-fluorouracil, 5-FU. Tell your doctor or health care professional if you have diarrhea or mouth sores that do not get better or that get worse. What side effects may I notice from receiving this medicine? Side effects that you should report to your doctor or health care professional as soon as possible:  allergic reactions like skin rash, itching or hives, swelling of the face, lips, or tongue  breathing problems  fever, infection  mouth sores  unusual bleeding or bruising  unusually weak or tired Side effects that usually do not require medical attention (report to your doctor or health care professional if they continue or are bothersome):  constipation or diarrhea  loss of appetite  nausea, vomiting This list may not describe all possible side effects. Call your doctor for medical advice about side effects. You may report side effects to FDA at 1-800-FDA-1088. Where should I keep my medicine? This drug is given in a hospital or clinic and will not be stored at home. NOTE: This sheet is a summary. It may not cover all possible information. If you have questions about this medicine, talk to your doctor, pharmacist, or health care provider.  2021 Elsevier/Gold Standard (2007-07-06 16:50:29)  Fluorouracil, 5-FU injection What is this medicine? FLUOROURACIL, 5-FU (flure oh YOOR a sil) is a chemotherapy drug. It slows the growth of cancer cells. This medicine is used to treat many types of cancer like breast cancer, colon or rectal cancer, pancreatic cancer, and stomach cancer. This medicine may be used for other purposes; ask your health care provider or pharmacist if you have questions. COMMON BRAND NAME(S): Adrucil What should I tell my health care provider before I take this medicine? They need to know if you have any of these conditions:  blood disorders  dihydropyrimidine dehydrogenase (DPD) deficiency  infection (especially a virus infection such as chickenpox, cold  sores, or herpes)  kidney disease  liver disease  malnourished, poor nutrition  recent or ongoing radiation therapy  an unusual or allergic reaction to fluorouracil, other chemotherapy, other medicines, foods, dyes, or preservatives  pregnant or trying to get pregnant  breast-feeding How should I use this medicine? This drug is given as an infusion or injection into a vein. It is administered in a hospital or clinic by a specially trained health care professional. Talk to your pediatrician regarding the use of this medicine in children. Special care may be needed. Overdosage: If you think you have taken too much of this medicine contact a poison control center or emergency room at once. NOTE: This medicine   is only for you. Do not share this medicine with others. What if I miss a dose? It is important not to miss your dose. Call your doctor or health care professional if you are unable to keep an appointment. What may interact with this medicine? Do not take this medicine with any of the following medications:  live virus vaccines This medicine may also interact with the following medications:  medicines that treat or prevent blood clots like warfarin, enoxaparin, and dalteparin This list may not describe all possible interactions. Give your health care provider a list of all the medicines, herbs, non-prescription drugs, or dietary supplements you use. Also tell them if you smoke, drink alcohol, or use illegal drugs. Some items may interact with your medicine. What should I watch for while using this medicine? Visit your doctor for checks on your progress. This drug may make you feel generally unwell. This is not uncommon, as chemotherapy can affect healthy cells as well as cancer cells. Report any side effects. Continue your course of treatment even though you feel ill unless your doctor tells you to stop. In some cases, you may be given additional medicines to help with side effects.  Follow all directions for their use. Call your doctor or health care professional for advice if you get a fever, chills or sore throat, or other symptoms of a cold or flu. Do not treat yourself. This drug decreases your body's ability to fight infections. Try to avoid being around people who are sick. This medicine may increase your risk to bruise or bleed. Call your doctor or health care professional if you notice any unusual bleeding. Be careful brushing and flossing your teeth or using a toothpick because you may get an infection or bleed more easily. If you have any dental work done, tell your dentist you are receiving this medicine. Avoid taking products that contain aspirin, acetaminophen, ibuprofen, naproxen, or ketoprofen unless instructed by your doctor. These medicines may hide a fever. Do not become pregnant while taking this medicine. Women should inform their doctor if they wish to become pregnant or think they might be pregnant. There is a potential for serious side effects to an unborn child. Talk to your health care professional or pharmacist for more information. Do not breast-feed an infant while taking this medicine. Men should inform their doctor if they wish to father a child. This medicine may lower sperm counts. Do not treat diarrhea with over the counter products. Contact your doctor if you have diarrhea that lasts more than 2 days or if it is severe and watery. This medicine can make you more sensitive to the sun. Keep out of the sun. If you cannot avoid being in the sun, wear protective clothing and use sunscreen. Do not use sun lamps or tanning beds/booths. What side effects may I notice from receiving this medicine? Side effects that you should report to your doctor or health care professional as soon as possible:  allergic reactions like skin rash, itching or hives, swelling of the face, lips, or tongue  low blood counts - this medicine may decrease the number of white  blood cells, red blood cells and platelets. You may be at increased risk for infections and bleeding.  signs of infection - fever or chills, cough, sore throat, pain or difficulty passing urine  signs of decreased platelets or bleeding - bruising, pinpoint red spots on the skin, black, tarry stools, blood in the urine  signs of decreased red blood cells -   unusually weak or tired, fainting spells, lightheadedness  breathing problems  changes in vision  chest pain  mouth sores  nausea and vomiting  pain, swelling, redness at site where injected  pain, tingling, numbness in the hands or feet  redness, swelling, or sores on hands or feet  stomach pain  unusual bleeding Side effects that usually do not require medical attention (report to your doctor or health care professional if they continue or are bothersome):  changes in finger or toe nails  diarrhea  dry or itchy skin  hair loss  headache  loss of appetite  sensitivity of eyes to the light  stomach upset  unusually teary eyes This list may not describe all possible side effects. Call your doctor for medical advice about side effects. You may report side effects to FDA at 1-800-FDA-1088. Where should I keep my medicine? This drug is given in a hospital or clinic and will not be stored at home. NOTE: This sheet is a summary. It may not cover all possible information. If you have questions about this medicine, talk to your doctor, pharmacist, or health care provider.  2021 Elsevier/Gold Standard (2018-11-30 15:00:03)   

## 2020-05-31 ENCOUNTER — Other Ambulatory Visit: Payer: Self-pay

## 2020-05-31 ENCOUNTER — Inpatient Hospital Stay: Payer: Medicare Other

## 2020-05-31 VITALS — BP 124/90 | HR 81 | Resp 20

## 2020-05-31 DIAGNOSIS — Z5111 Encounter for antineoplastic chemotherapy: Secondary | ICD-10-CM | POA: Diagnosis not present

## 2020-05-31 DIAGNOSIS — C189 Malignant neoplasm of colon, unspecified: Secondary | ICD-10-CM

## 2020-05-31 DIAGNOSIS — C182 Malignant neoplasm of ascending colon: Secondary | ICD-10-CM

## 2020-05-31 DIAGNOSIS — C779 Secondary and unspecified malignant neoplasm of lymph node, unspecified: Secondary | ICD-10-CM | POA: Diagnosis not present

## 2020-05-31 DIAGNOSIS — C7989 Secondary malignant neoplasm of other specified sites: Secondary | ICD-10-CM | POA: Diagnosis not present

## 2020-05-31 DIAGNOSIS — D701 Agranulocytosis secondary to cancer chemotherapy: Secondary | ICD-10-CM | POA: Diagnosis not present

## 2020-05-31 DIAGNOSIS — T451X5A Adverse effect of antineoplastic and immunosuppressive drugs, initial encounter: Secondary | ICD-10-CM | POA: Diagnosis not present

## 2020-05-31 MED ORDER — HEPARIN SOD (PORK) LOCK FLUSH 100 UNIT/ML IV SOLN
500.0000 [IU] | Freq: Once | INTRAVENOUS | Status: AC | PRN
  Administered 2020-05-31: 500 [IU]
  Filled 2020-05-31: qty 5

## 2020-05-31 MED ORDER — SODIUM CHLORIDE 0.9% FLUSH
10.0000 mL | INTRAVENOUS | Status: DC | PRN
  Administered 2020-05-31: 10 mL
  Filled 2020-05-31: qty 10

## 2020-05-31 MED ORDER — PEGFILGRASTIM-CBQV 6 MG/0.6ML ~~LOC~~ SOSY
6.0000 mg | PREFILLED_SYRINGE | Freq: Once | SUBCUTANEOUS | Status: AC
Start: 2020-05-31 — End: 2020-05-31
  Administered 2020-05-31: 6 mg via SUBCUTANEOUS

## 2020-05-31 NOTE — Patient Instructions (Signed)
[facility]  Discharge Instructions: Thank you for choosing [facility] to provide your oncology and hematology care.   If you have a lab appointment with the [facility], please go directly to the [facility] and check in at the registration area.   Wear comfortable clothing and clothing appropriate for easy access to any Portacath or PICC line.   We strive to give you quality time with your provider. You may need to reschedule your appointment if you arrive late (15 or more minutes).  Arriving late affects you and other patients whose appointments are after yours.  Also, if you miss three or more appointments without notifying the office, you may be dismissed from the clinic at the provider's discretion.      For prescription refill requests, have your pharmacy contact our office and allow 72 hours for refills to be completed.    Today you received the following chemotherapy and/or immunotherapy agents 105fu and udenyca    To help prevent nausea and vomiting after your treatment, we encourage you to take your nausea medication as directed.  BELOW ARE SYMPTOMS THAT SHOULD BE REPORTED IMMEDIATELY: . *FEVER GREATER THAN 100.4 F (38 C) OR HIGHER . *CHILLS OR SWEATING . *NAUSEA AND VOMITING THAT IS NOT CONTROLLED WITH YOUR NAUSEA MEDICATION . *UNUSUAL SHORTNESS OF BREATH . *UNUSUAL BRUISING OR BLEEDING . *URINARY PROBLEMS (pain or burning when urinating, or frequent urination) . *BOWEL PROBLEMS (unusual diarrhea, constipation, pain near the anus) . TENDERNESS IN MOUTH AND THROAT WITH OR WITHOUT PRESENCE OF ULCERS (sore throat, sores in mouth, or a toothache) . UNUSUAL RASH, SWELLING OR PAIN  . UNUSUAL VAGINAL DISCHARGE OR ITCHING   Items with * indicate a potential emergency and should be followed up as soon as possible or go to the Emergency Department if any problems should occur.  Please show the CHEMOTHERAPY ALERT CARD or IMMUNOTHERAPY ALERT  CARD at check-in to the Emergency Department and triage nurse.  Should you have questions after your visit or need to cancel or reschedule your appointment, please contact [facility]  Dept: [phone]  and follow the prompts.  Office hours are 8:00 a.m. to 4:30 p.m. Monday - Friday. Please note that voicemails left after 4:00 p.m. may not be returned until the following business day.  We are closed weekends and major holidays. You have access to a nurse at all times for urgent questions. Please call the main number to the clinic Dept: [phone] and follow the prompts.   For any non-urgent questions, you may also contact your provider using MyChart. We now offer e-Visits for anyone 53 and older to request care online for non-urgent symptoms. For details visit [email or web address].   Also download the MyChart app! Go to the app store, search "MyChart", open the app, select [facility], and log in with your MyChart username and password.  Due to Covid, a mask is required upon entering the hospital/clinic. If you do not have a mask, one will be given to you upon arrival. For doctor visits, patients may have 1 support person aged 53 or older with them. For treatment visits, patients cannot have anyone with them due to current Covid guidelines and our immunocompromised population.    Pegfilgrastim injection What is this medicine? PEGFILGRASTIM (PEG fil gra stim) is a long-acting granulocyte colony-stimulating factor that stimulates the growth of neutrophils, a type of white blood cell important in the body's fight against infection. It is  used to reduce the incidence of fever and infection in patients with certain types of cancer who are receiving chemotherapy that affects the bone marrow, and to increase survival after being exposed to high doses of radiation. This medicine may be used for other purposes; ask your health care provider or pharmacist if you have  questions. COMMON BRAND NAME(S): Rexene Edison, Ziextenzo What should I tell my health care provider before I take this medicine? They need to know if you have any of these conditions:  kidney disease  latex allergy  ongoing radiation therapy  sickle cell disease  skin reactions to acrylic adhesives (On-Body Injector only)  an unusual or allergic reaction to pegfilgrastim, filgrastim, other medicines, foods, dyes, or preservatives  pregnant or trying to get pregnant  breast-feeding How should I use this medicine? This medicine is for injection under the skin. If you get this medicine at home, you will be taught how to prepare and give the pre-filled syringe or how to use the On-body Injector. Refer to the patient Instructions for Use for detailed instructions. Use exactly as directed. Tell your healthcare provider immediately if you suspect that the On-body Injector may not have performed as intended or if you suspect the use of the On-body Injector resulted in a missed or partial dose. It is important that you put your used needles and syringes in a special sharps container. Do not put them in a trash can. If you do not have a sharps container, call your pharmacist or healthcare provider to get one. Talk to your pediatrician regarding the use of this medicine in children. While this drug may be prescribed for selected conditions, precautions do apply. Overdosage: If you think you have taken too much of this medicine contact a poison control center or emergency room at once. NOTE: This medicine is only for you. Do not share this medicine with others. What if I miss a dose? It is important not to miss your dose. Call your doctor or health care professional if you miss your dose. If you miss a dose due to an On-body Injector failure or leakage, a new dose should be administered as soon as possible using a single prefilled syringe for manual use. What may interact  with this medicine? Interactions have not been studied. This list may not describe all possible interactions. Give your health care provider a list of all the medicines, herbs, non-prescription drugs, or dietary supplements you use. Also tell them if you smoke, drink alcohol, or use illegal drugs. Some items may interact with your medicine. What should I watch for while using this medicine? Your condition will be monitored carefully while you are receiving this medicine. You may need blood work done while you are taking this medicine. Talk to your health care provider about your risk of cancer. You may be more at risk for certain types of cancer if you take this medicine. If you are going to need a MRI, CT scan, or other procedure, tell your doctor that you are using this medicine (On-Body Injector only). What side effects may I notice from receiving this medicine? Side effects that you should report to your doctor or health care professional as soon as possible:  allergic reactions (skin rash, itching or hives, swelling of the face, lips, or tongue)  back pain  dizziness  fever  pain, redness, or irritation at site where injected  pinpoint red spots on the skin  red or dark-brown urine  shortness of breath  or breathing problems  stomach or side pain, or pain at the shoulder  swelling  tiredness  trouble passing urine or change in the amount of urine  unusual bruising or bleeding Side effects that usually do not require medical attention (report to your doctor or health care professional if they continue or are bothersome):  bone pain  muscle pain This list may not describe all possible side effects. Call your doctor for medical advice about side effects. You may report side effects to FDA at 1-800-FDA-1088. Where should I keep my medicine? Keep out of the reach of children. If you are using this medicine at home, you will be instructed on how to store it. Throw away any  unused medicine after the expiration date on the label. NOTE: This sheet is a summary. It may not cover all possible information. If you have questions about this medicine, talk to your doctor, pharmacist, or health care provider.  2021 Elsevier/Gold Standard (2019-01-21 13:20:51) Fluorouracil, 5-FU injection What is this medicine? FLUOROURACIL, 5-FU (flure oh YOOR a sil) is a chemotherapy drug. It slows the growth of cancer cells. This medicine is used to treat many types of cancer like breast cancer, colon or rectal cancer, pancreatic cancer, and stomach cancer. This medicine may be used for other purposes; ask your health care provider or pharmacist if you have questions. COMMON BRAND NAME(S): Adrucil What should I tell my health care provider before I take this medicine? They need to know if you have any of these conditions:  blood disorders  dihydropyrimidine dehydrogenase (DPD) deficiency  infection (especially a virus infection such as chickenpox, cold sores, or herpes)  kidney disease  liver disease  malnourished, poor nutrition  recent or ongoing radiation therapy  an unusual or allergic reaction to fluorouracil, other chemotherapy, other medicines, foods, dyes, or preservatives  pregnant or trying to get pregnant  breast-feeding How should I use this medicine? This drug is given as an infusion or injection into a vein. It is administered in a hospital or clinic by a specially trained health care professional. Talk to your pediatrician regarding the use of this medicine in children. Special care may be needed. Overdosage: If you think you have taken too much of this medicine contact a poison control center or emergency room at once. NOTE: This medicine is only for you. Do not share this medicine with others. What if I miss a dose? It is important not to miss your dose. Call your doctor or health care professional if you are unable to keep an appointment. What may  interact with this medicine? Do not take this medicine with any of the following medications:  live virus vaccines This medicine may also interact with the following medications:  medicines that treat or prevent blood clots like warfarin, enoxaparin, and dalteparin This list may not describe all possible interactions. Give your health care provider a list of all the medicines, herbs, non-prescription drugs, or dietary supplements you use. Also tell them if you smoke, drink alcohol, or use illegal drugs. Some items may interact with your medicine. What should I watch for while using this medicine? Visit your doctor for checks on your progress. This drug may make you feel generally unwell. This is not uncommon, as chemotherapy can affect healthy cells as well as cancer cells. Report any side effects. Continue your course of treatment even though you feel ill unless your doctor tells you to stop. In some cases, you may be given additional medicines to help  with side effects. Follow all directions for their use. Call your doctor or health care professional for advice if you get a fever, chills or sore throat, or other symptoms of a cold or flu. Do not treat yourself. This drug decreases your body's ability to fight infections. Try to avoid being around people who are sick. This medicine may increase your risk to bruise or bleed. Call your doctor or health care professional if you notice any unusual bleeding. Be careful brushing and flossing your teeth or using a toothpick because you may get an infection or bleed more easily. If you have any dental work done, tell your dentist you are receiving this medicine. Avoid taking products that contain aspirin, acetaminophen, ibuprofen, naproxen, or ketoprofen unless instructed by your doctor. These medicines may hide a fever. Do not become pregnant while taking this medicine. Women should inform their doctor if they wish to become pregnant or think they might be  pregnant. There is a potential for serious side effects to an unborn child. Talk to your health care professional or pharmacist for more information. Do not breast-feed an infant while taking this medicine. Men should inform their doctor if they wish to father a child. This medicine may lower sperm counts. Do not treat diarrhea with over the counter products. Contact your doctor if you have diarrhea that lasts more than 2 days or if it is severe and watery. This medicine can make you more sensitive to the sun. Keep out of the sun. If you cannot avoid being in the sun, wear protective clothing and use sunscreen. Do not use sun lamps or tanning beds/booths. What side effects may I notice from receiving this medicine? Side effects that you should report to your doctor or health care professional as soon as possible:  allergic reactions like skin rash, itching or hives, swelling of the face, lips, or tongue  low blood counts - this medicine may decrease the number of white blood cells, red blood cells and platelets. You may be at increased risk for infections and bleeding.  signs of infection - fever or chills, cough, sore throat, pain or difficulty passing urine  signs of decreased platelets or bleeding - bruising, pinpoint red spots on the skin, black, tarry stools, blood in the urine  signs of decreased red blood cells - unusually weak or tired, fainting spells, lightheadedness  breathing problems  changes in vision  chest pain  mouth sores  nausea and vomiting  pain, swelling, redness at site where injected  pain, tingling, numbness in the hands or feet  redness, swelling, or sores on hands or feet  stomach pain  unusual bleeding Side effects that usually do not require medical attention (report to your doctor or health care professional if they continue or are bothersome):  changes in finger or toe nails  diarrhea  dry or itchy skin  hair loss  headache  loss of  appetite  sensitivity of eyes to the light  stomach upset  unusually teary eyes This list may not describe all possible side effects. Call your doctor for medical advice about side effects. You may report side effects to FDA at 1-800-FDA-1088. Where should I keep my medicine? This drug is given in a hospital or clinic and will not be stored at home. NOTE: This sheet is a summary. It may not cover all possible information. If you have questions about this medicine, talk to your doctor, pharmacist, or health care provider.  2021 Elsevier/Gold Standard (2018-11-30 15:00:03)  Fluorouracil, 5-FU injection What is this medicine? FLUOROURACIL, 5-FU (flure oh YOOR a sil) is a chemotherapy drug. It slows the growth of cancer cells. This medicine is used to treat many types of cancer like breast cancer, colon or rectal cancer, pancreatic cancer, and stomach cancer. This medicine may be used for other purposes; ask your health care provider or pharmacist if you have questions. COMMON BRAND NAME(S): Adrucil What should I tell my health care provider before I take this medicine? They need to know if you have any of these conditions:  blood disorders  dihydropyrimidine dehydrogenase (DPD) deficiency  infection (especially a virus infection such as chickenpox, cold sores, or herpes)  kidney disease  liver disease  malnourished, poor nutrition  recent or ongoing radiation therapy  an unusual or allergic reaction to fluorouracil, other chemotherapy, other medicines, foods, dyes, or preservatives  pregnant or trying to get pregnant  breast-feeding How should I use this medicine? This drug is given as an infusion or injection into a vein. It is administered in a hospital or clinic by a specially trained health care professional. Talk to your pediatrician regarding the use of this medicine in children. Special care may be needed. Overdosage: If you think you have taken too much of this  medicine contact a poison control center or emergency room at once. NOTE: This medicine is only for you. Do not share this medicine with others. What if I miss a dose? It is important not to miss your dose. Call your doctor or health care professional if you are unable to keep an appointment. What may interact with this medicine? Do not take this medicine with any of the following medications:  live virus vaccines This medicine may also interact with the following medications:  medicines that treat or prevent blood clots like warfarin, enoxaparin, and dalteparin This list may not describe all possible interactions. Give your health care provider a list of all the medicines, herbs, non-prescription drugs, or dietary supplements you use. Also tell them if you smoke, drink alcohol, or use illegal drugs. Some items may interact with your medicine. What should I watch for while using this medicine? Visit your doctor for checks on your progress. This drug may make you feel generally unwell. This is not uncommon, as chemotherapy can affect healthy cells as well as cancer cells. Report any side effects. Continue your course of treatment even though you feel ill unless your doctor tells you to stop. In some cases, you may be given additional medicines to help with side effects. Follow all directions for their use. Call your doctor or health care professional for advice if you get a fever, chills or sore throat, or other symptoms of a cold or flu. Do not treat yourself. This drug decreases your body's ability to fight infections. Try to avoid being around people who are sick. This medicine may increase your risk to bruise or bleed. Call your doctor or health care professional if you notice any unusual bleeding. Be careful brushing and flossing your teeth or using a toothpick because you may get an infection or bleed more easily. If you have any dental work done, tell your dentist you are receiving this  medicine. Avoid taking products that contain aspirin, acetaminophen, ibuprofen, naproxen, or ketoprofen unless instructed by your doctor. These medicines may hide a fever. Do not become pregnant while taking this medicine. Women should inform their doctor if they wish to become pregnant or think they might be pregnant. There is a  potential for serious side effects to an unborn child. Talk to your health care professional or pharmacist for more information. Do not breast-feed an infant while taking this medicine. Men should inform their doctor if they wish to father a child. This medicine may lower sperm counts. Do not treat diarrhea with over the counter products. Contact your doctor if you have diarrhea that lasts more than 2 days or if it is severe and watery. This medicine can make you more sensitive to the sun. Keep out of the sun. If you cannot avoid being in the sun, wear protective clothing and use sunscreen. Do not use sun lamps or tanning beds/booths. What side effects may I notice from receiving this medicine? Side effects that you should report to your doctor or health care professional as soon as possible:  allergic reactions like skin rash, itching or hives, swelling of the face, lips, or tongue  low blood counts - this medicine may decrease the number of white blood cells, red blood cells and platelets. You may be at increased risk for infections and bleeding.  signs of infection - fever or chills, cough, sore throat, pain or difficulty passing urine  signs of decreased platelets or bleeding - bruising, pinpoint red spots on the skin, black, tarry stools, blood in the urine  signs of decreased red blood cells - unusually weak or tired, fainting spells, lightheadedness  breathing problems  changes in vision  chest pain  mouth sores  nausea and vomiting  pain, swelling, redness at site where injected  pain, tingling, numbness in the hands or feet  redness, swelling, or  sores on hands or feet  stomach pain  unusual bleeding Side effects that usually do not require medical attention (report to your doctor or health care professional if they continue or are bothersome):  changes in finger or toe nails  diarrhea  dry or itchy skin  hair loss  headache  loss of appetite  sensitivity of eyes to the light  stomach upset  unusually teary eyes This list may not describe all possible side effects. Call your doctor for medical advice about side effects. You may report side effects to FDA at 1-800-FDA-1088. Where should I keep my medicine? This drug is given in a hospital or clinic and will not be stored at home. NOTE: This sheet is a summary. It may not cover all possible information. If you have questions about this medicine, talk to your doctor, pharmacist, or health care provider.  2021 Elsevier/Gold Standard (2018-11-30 15:00:03)

## 2020-06-06 ENCOUNTER — Other Ambulatory Visit: Payer: Self-pay | Admitting: *Deleted

## 2020-06-06 DIAGNOSIS — C182 Malignant neoplasm of ascending colon: Secondary | ICD-10-CM

## 2020-06-07 ENCOUNTER — Ambulatory Visit (HOSPITAL_BASED_OUTPATIENT_CLINIC_OR_DEPARTMENT_OTHER)
Admission: RE | Admit: 2020-06-07 | Discharge: 2020-06-07 | Disposition: A | Discharge status: Home / Self Care | Payer: Medicare Other | Source: Ambulatory Visit | Attending: Nurse Practitioner | Admitting: Nurse Practitioner

## 2020-06-07 ENCOUNTER — Other Ambulatory Visit: Payer: Self-pay

## 2020-06-07 ENCOUNTER — Encounter (HOSPITAL_BASED_OUTPATIENT_CLINIC_OR_DEPARTMENT_OTHER): Payer: Self-pay

## 2020-06-07 DIAGNOSIS — C182 Malignant neoplasm of ascending colon: Secondary | ICD-10-CM | POA: Insufficient documentation

## 2020-06-07 DIAGNOSIS — C187 Malignant neoplasm of sigmoid colon: Secondary | ICD-10-CM | POA: Diagnosis not present

## 2020-06-07 IMAGING — CT CT ABD-PELV W/ CM
2 of 5 series · 16 of 46 positions shown, 18 images · IV contrast (APPLIED)
Comparison: [DATE]

CLINICAL DATA: Moderately differentiated adenocarcinoma of the
ascending colon, status post right hemicolectomy [DATE].

EXAM:
CT ABDOMEN AND PELVIS WITH CONTRAST
TECHNIQUE: Multidetector CT imaging of the abdomen and pelvis was performed
using the standard protocol following bolus administration of
intravenous contrast.
CONTRAST:  75mL OMNIPAQUE IOHEXOL 300 MG/ML  SOLN

[Series 2: abd pel w · axial · 0.75mm/px · z∈[+889,+1259]mm · 13 of 84 slices shown, 15 images]
[im 5/84  soft-tissue]
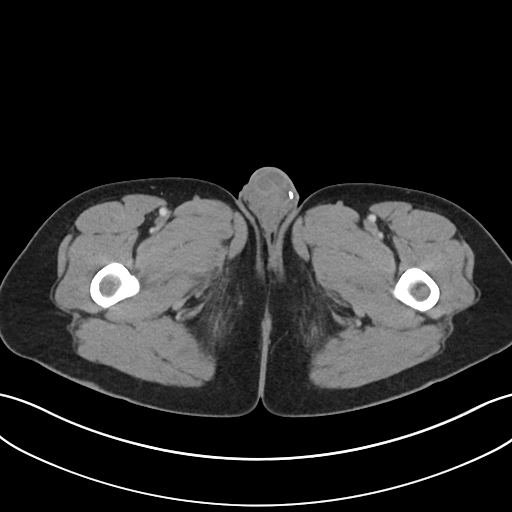
[im 5/84  bone]
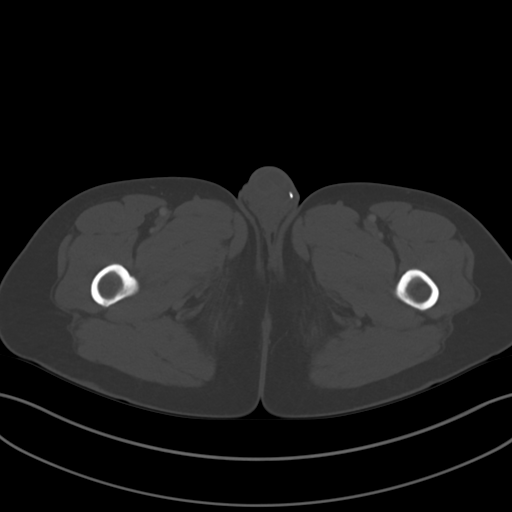
[im 13/84  soft-tissue]
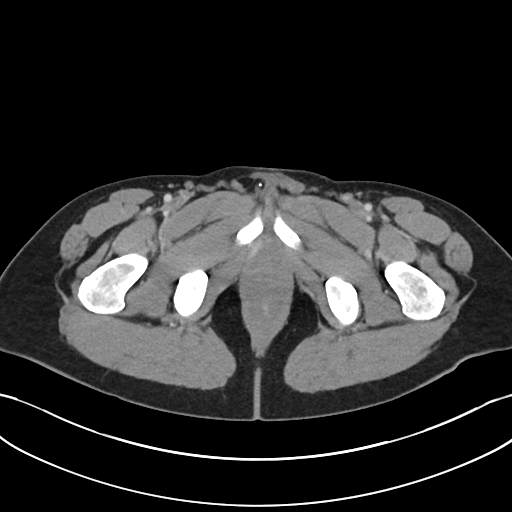
[im 17/84  soft-tissue]
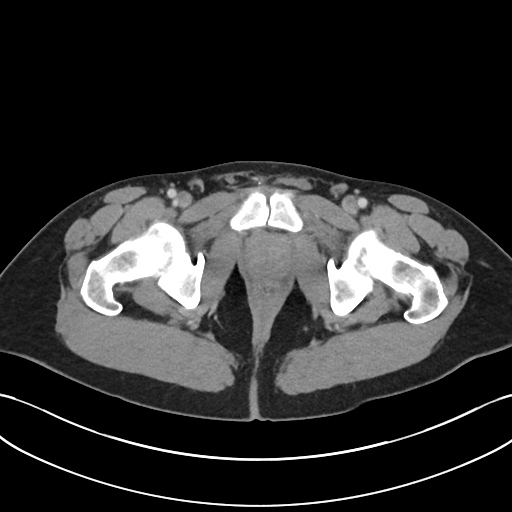
[im 25/84  soft-tissue]
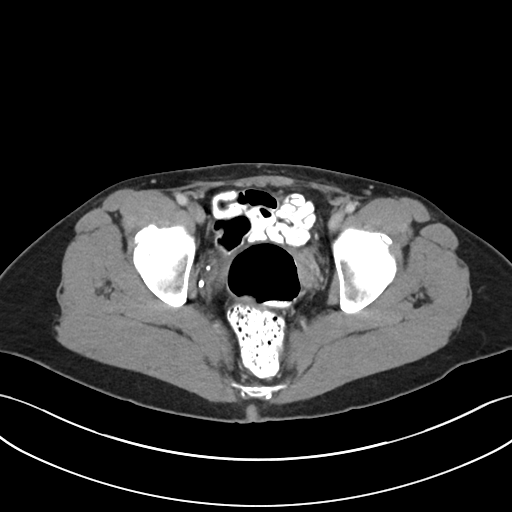
[im 30/84  soft-tissue]
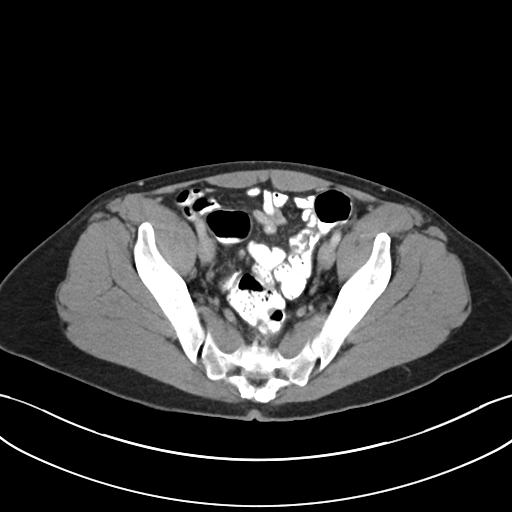
[im 38/84  soft-tissue]
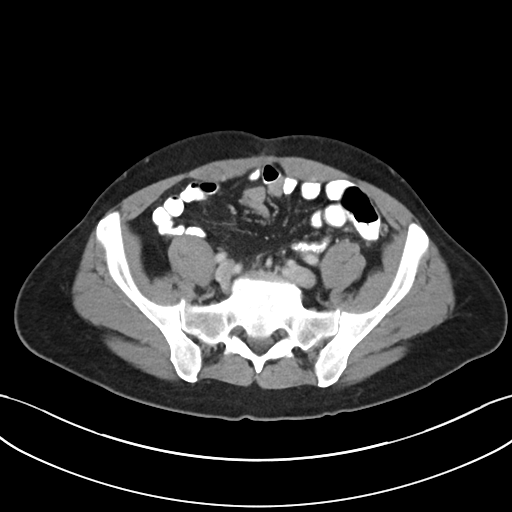
[im 42/84  soft-tissue]
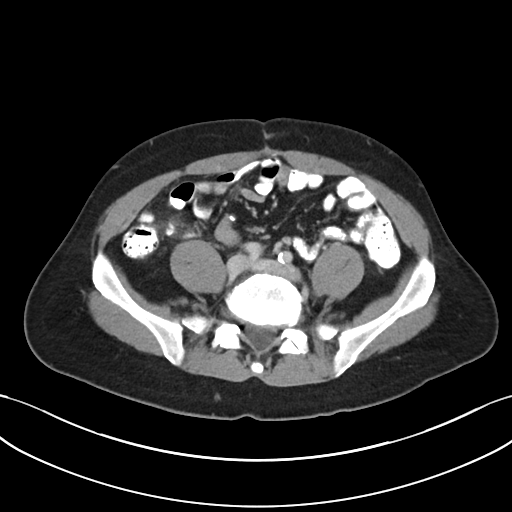
[im 46/84  soft-tissue]
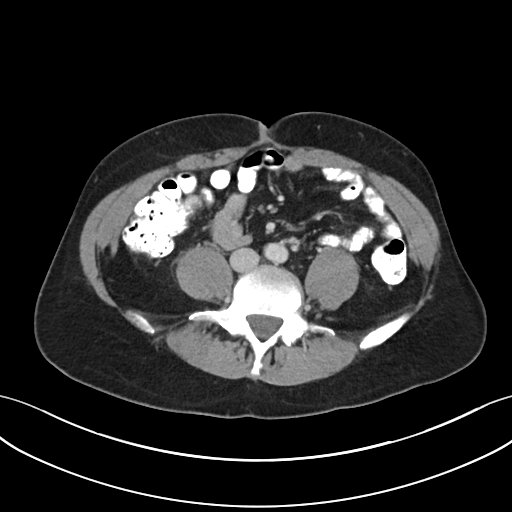
[im 54/84  soft-tissue]
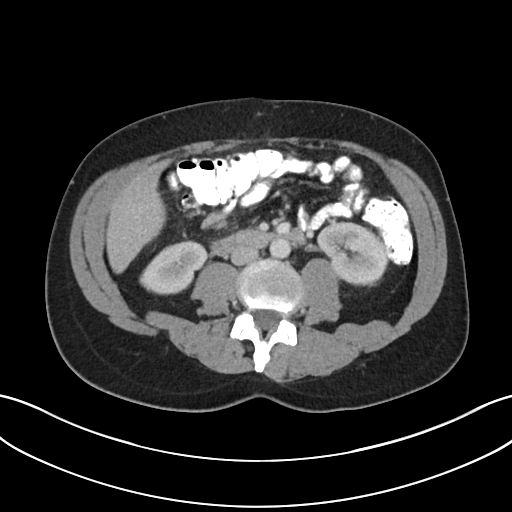
[im 54/84  bone]
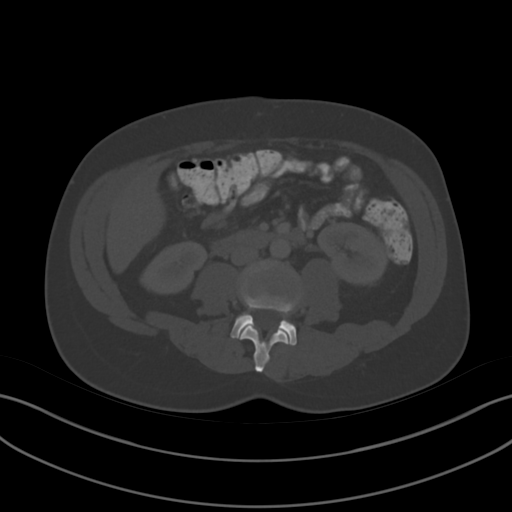
[im 59/84  soft-tissue]
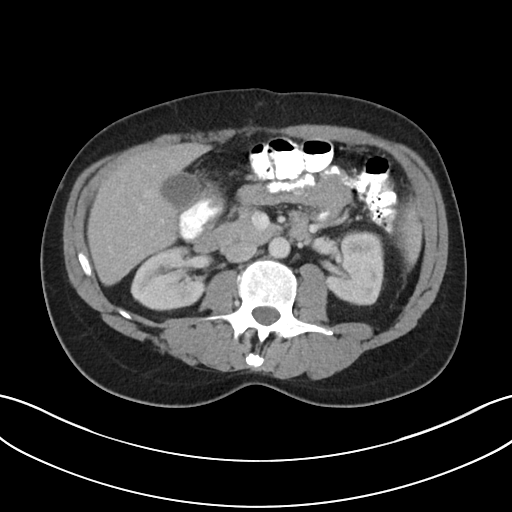
[im 67/84  soft-tissue]
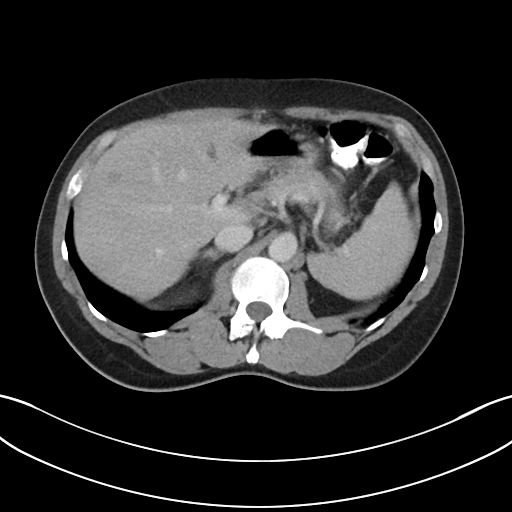
[im 71/84  soft-tissue]
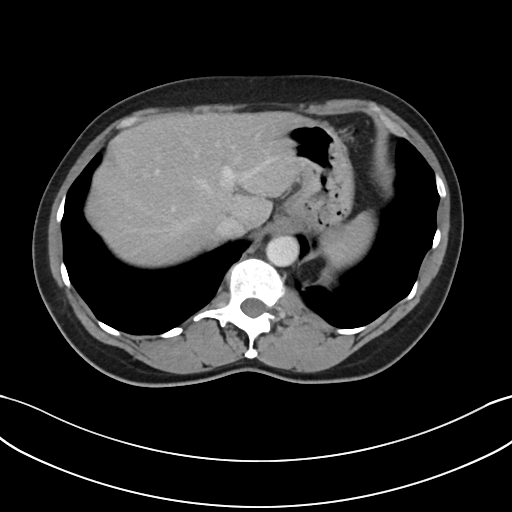
[im 79/84  soft-tissue]
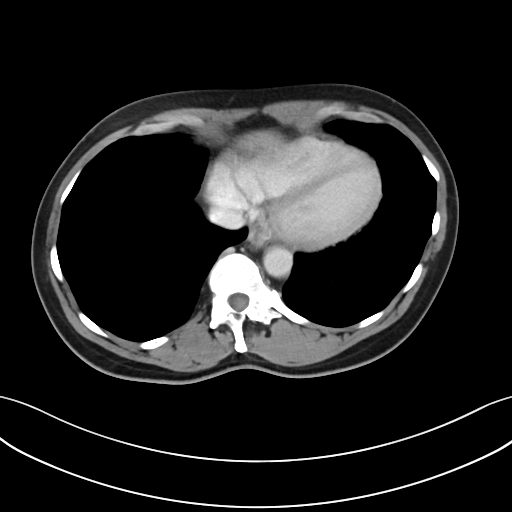

[Series 5: coronal · coronal · 0.77mm/px · 3 of 89 slices shown]
[im 30/89  soft-tissue]
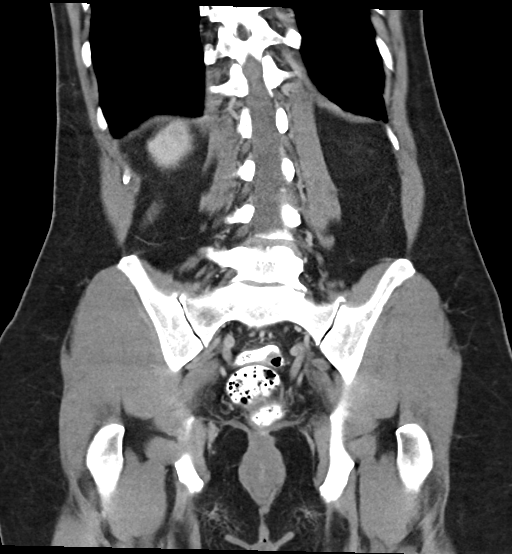
[im 40/89  soft-tissue]
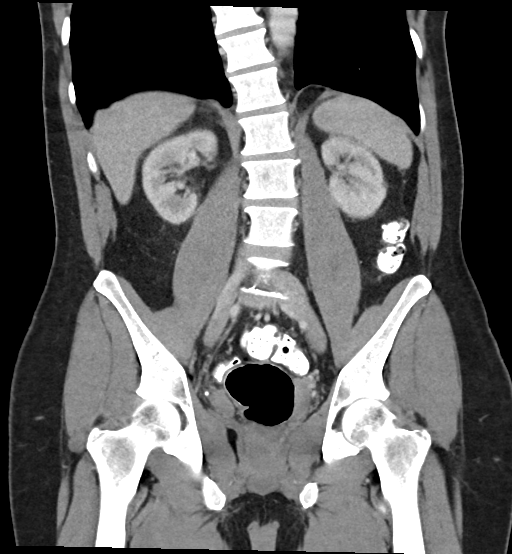
[im 49/89  soft-tissue]
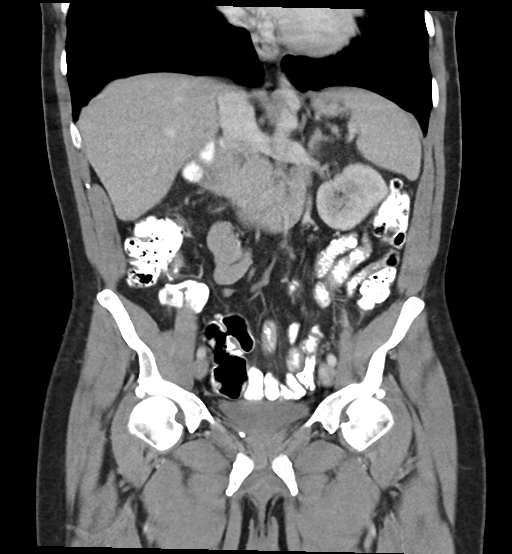

[16 of 46 positions shown; findings below may reference images not displayed]

FINDINGS: Lower chest: Clear lung bases. Normal heart size without pericardial
or pleural effusion.

Hepatobiliary: Motion degradation involving the lower chest and
upper abdomen. Bilateral hepatic metastasis again identified.

Index high right hepatic lobe lesion measures 8 mm on [DATE] versus
11 mm on the prior exam (when remeasured).

Lesion at the base of the caudate measures 9 mm on [DATE] versus 15 mm
on the prior (when remeasured).

Segment 8 lesion measures 10 mm on [DATE] versus 14 mm on the prior
(when remeasured).

No new liver lesions. Normal gallbladder, without biliary ductal
dilatation.

Pancreas: Normal, without mass or ductal dilatation.

Spleen: Normal in size, without focal abnormality.

Adrenals/Urinary Tract: Normal adrenal glands. Normal kidneys,
without hydronephrosis. Normal urinary bladder.

Stomach/Bowel: Normal stomach, without wall thickening. Partial
right hemicolectomy. Normal small bowel.

Vascular/Lymphatic: Aortic atherosclerosis. No abdominopelvic
adenopathy.

Reproductive: Normal prostate.

Other: No significant free fluid. No evidence of omental or
peritoneal disease.

Musculoskeletal: Mild convex left lumbar spine curvature.
Degenerative disc disease at the lumbosacral junction.
IMPRESSION: 1. Response to therapy of hepatic metastasis.
2. No new sites of disease.
3. Mild motion degradation superiorly.
4.  Aortic Atherosclerosis ([69]-[69]).

## 2020-06-07 MED ORDER — IOHEXOL 300 MG/ML  SOLN
75.0000 mL | Freq: Once | INTRAMUSCULAR | Status: AC | PRN
  Administered 2020-06-07: 75 mL via INTRAVENOUS

## 2020-06-10 ENCOUNTER — Other Ambulatory Visit: Payer: Self-pay | Admitting: Oncology

## 2020-06-12 ENCOUNTER — Inpatient Hospital Stay (HOSPITAL_BASED_OUTPATIENT_CLINIC_OR_DEPARTMENT_OTHER): Payer: Medicare Other | Admitting: Oncology

## 2020-06-12 ENCOUNTER — Other Ambulatory Visit: Payer: Self-pay

## 2020-06-12 ENCOUNTER — Inpatient Hospital Stay: Payer: Medicare Other

## 2020-06-12 VITALS — BP 144/85 | HR 88 | Temp 97.7°F | Resp 18 | Ht 70.0 in | Wt 149.0 lb

## 2020-06-12 DIAGNOSIS — D701 Agranulocytosis secondary to cancer chemotherapy: Secondary | ICD-10-CM | POA: Diagnosis not present

## 2020-06-12 DIAGNOSIS — C779 Secondary and unspecified malignant neoplasm of lymph node, unspecified: Secondary | ICD-10-CM | POA: Diagnosis not present

## 2020-06-12 DIAGNOSIS — C182 Malignant neoplasm of ascending colon: Secondary | ICD-10-CM | POA: Diagnosis not present

## 2020-06-12 DIAGNOSIS — C189 Malignant neoplasm of colon, unspecified: Secondary | ICD-10-CM

## 2020-06-12 DIAGNOSIS — C7989 Secondary malignant neoplasm of other specified sites: Secondary | ICD-10-CM | POA: Diagnosis not present

## 2020-06-12 DIAGNOSIS — T451X5A Adverse effect of antineoplastic and immunosuppressive drugs, initial encounter: Secondary | ICD-10-CM | POA: Diagnosis not present

## 2020-06-12 DIAGNOSIS — Z5111 Encounter for antineoplastic chemotherapy: Secondary | ICD-10-CM | POA: Diagnosis not present

## 2020-06-12 LAB — CBC WITH DIFFERENTIAL (CANCER CENTER ONLY)
Abs Immature Granulocytes: 0.26 10*3/uL — ABNORMAL HIGH (ref 0.00–0.07)
Basophils Absolute: 0.1 10*3/uL (ref 0.0–0.1)
Basophils Relative: 1 %
Eosinophils Absolute: 0.2 10*3/uL (ref 0.0–0.5)
Eosinophils Relative: 2 %
HCT: 34.8 % — ABNORMAL LOW (ref 39.0–52.0)
Hemoglobin: 10.6 g/dL — ABNORMAL LOW (ref 13.0–17.0)
Immature Granulocytes: 4 %
Lymphocytes Relative: 27 %
Lymphs Abs: 2 10*3/uL (ref 0.7–4.0)
MCH: 24.9 pg — ABNORMAL LOW (ref 26.0–34.0)
MCHC: 30.5 g/dL (ref 30.0–36.0)
MCV: 81.9 fL (ref 80.0–100.0)
Monocytes Absolute: 0.6 10*3/uL (ref 0.1–1.0)
Monocytes Relative: 9 %
Neutro Abs: 4.3 10*3/uL (ref 1.7–7.7)
Neutrophils Relative %: 57 %
Platelet Count: 135 10*3/uL — ABNORMAL LOW (ref 150–400)
RBC: 4.25 MIL/uL (ref 4.22–5.81)
RDW: 19.9 % — ABNORMAL HIGH (ref 11.5–15.5)
WBC Count: 7.4 10*3/uL (ref 4.0–10.5)
nRBC: 0.7 % — ABNORMAL HIGH (ref 0.0–0.2)

## 2020-06-12 LAB — CMP (CANCER CENTER ONLY)
ALT: 8 U/L (ref 0–44)
AST: 11 U/L — ABNORMAL LOW (ref 15–41)
Albumin: 3.5 g/dL (ref 3.5–5.0)
Alkaline Phosphatase: 78 U/L (ref 38–126)
Anion gap: 8 (ref 5–15)
BUN: 11 mg/dL (ref 6–20)
CO2: 22 mmol/L (ref 22–32)
Calcium: 7.7 mg/dL — ABNORMAL LOW (ref 8.9–10.3)
Chloride: 111 mmol/L (ref 98–111)
Creatinine: 1.17 mg/dL (ref 0.61–1.24)
GFR, Estimated: >60 mL/min (ref >60)
Glucose, Bld: 84 mg/dL (ref 70–99)
Potassium: 3.3 mmol/L — ABNORMAL LOW (ref 3.5–5.1)
Sodium: 141 mmol/L (ref 135–145)
Total Bilirubin: 0.3 mg/dL (ref 0.3–1.2)
Total Protein: 5.5 g/dL — ABNORMAL LOW (ref 6.5–8.1)

## 2020-06-12 LAB — MAGNESIUM: Magnesium: 1.8 mg/dL (ref 1.7–2.4)

## 2020-06-12 MED ORDER — SODIUM CHLORIDE 0.9 % IV SOLN
180.0000 mg/m2 | Freq: Once | INTRAVENOUS | Status: AC
  Administered 2020-06-12: 320 mg via INTRAVENOUS
  Filled 2020-06-12: qty 16

## 2020-06-12 MED ORDER — FLUOROURACIL CHEMO INJECTION 2.5 GM/50ML
400.0000 mg/m2 | Freq: Once | INTRAVENOUS | Status: AC
  Administered 2020-06-12: 700 mg via INTRAVENOUS
  Filled 2020-06-12: qty 14

## 2020-06-12 MED ORDER — SODIUM CHLORIDE 0.9 % IV SOLN
400.0000 mg/m2 | Freq: Once | INTRAVENOUS | Status: AC
  Administered 2020-06-12: 716 mg via INTRAVENOUS
  Filled 2020-06-12: qty 35.8

## 2020-06-12 MED ORDER — SODIUM CHLORIDE 0.9 % IV SOLN
Freq: Once | INTRAVENOUS | Status: AC
Start: 2020-06-12 — End: 2020-06-12
  Filled 2020-06-12: qty 250

## 2020-06-12 MED ORDER — ATROPINE SULFATE 1 MG/ML IJ SOLN
0.5000 mg | Freq: Once | INTRAMUSCULAR | Status: AC | PRN
  Administered 2020-06-12: 0.5 mg via INTRAVENOUS
  Filled 2020-06-12: qty 1

## 2020-06-12 MED ORDER — LIDOCAINE-PRILOCAINE 2.5-2.5 % EX CREA: 1.0000 "application " | TOPICAL_CREAM | CUTANEOUS | 2 refills | Status: DC | PRN

## 2020-06-12 MED ORDER — SODIUM CHLORIDE 0.9 % IV SOLN
2400.0000 mg/m2 | INTRAVENOUS | Status: DC
  Administered 2020-06-12: 4300 mg via INTRAVENOUS
  Filled 2020-06-12: qty 86

## 2020-06-12 MED ORDER — SODIUM CHLORIDE 0.9 % IV SOLN
10.0000 mg | Freq: Once | INTRAVENOUS | Status: AC
  Administered 2020-06-12: 10 mg via INTRAVENOUS
  Filled 2020-06-12: qty 1

## 2020-06-12 MED ORDER — SODIUM CHLORIDE 0.9 % IV SOLN
Freq: Once | INTRAVENOUS | Status: DC
  Filled 2020-06-12: qty 250

## 2020-06-12 MED ORDER — PALONOSETRON HCL INJECTION 0.25 MG/5ML
0.2500 mg | Freq: Once | INTRAVENOUS | Status: AC
  Administered 2020-06-12: 0.25 mg via INTRAVENOUS
  Filled 2020-06-12: qty 5

## 2020-06-12 MED ORDER — ONDANSETRON HCL 8 MG PO TABS: 8.0000 mg | ORAL_TABLET | Freq: Three times a day (TID) | ORAL | 1 refills | Status: AC | PRN

## 2020-06-12 NOTE — Patient Instructions (Signed)
[facility]  Discharge Instructions: Thank you for choosing [facility] to provide your oncology and hematology care.   If you have a lab appointment with the [facility], please go directly to the [facility] and check in at the registration area.   Wear comfortable clothing and clothing appropriate for easy access to any Portacath or PICC line.   We strive to give you quality time with your provider. You may need to reschedule your appointment if you arrive late (15 or more minutes).  Arriving late affects you and other patients whose appointments are after yours.  Also, if you miss three or more appointments without notifying the office, you may be dismissed from the clinic at the provider's discretion.      For prescription refill requests, have your pharmacy contact our office and allow 72 hours for refills to be completed.    Today you received the following chemotherapy and/or immunotherapy agents: Irinotecan, Leucovorin, 5FU  To help prevent nausea and vomiting after your treatment, we encourage you to take your nausea medication as directed.  BELOW ARE SYMPTOMS THAT SHOULD BE REPORTED IMMEDIATELY: . *FEVER GREATER THAN 100.4 F (38 C) OR HIGHER . *CHILLS OR SWEATING . *NAUSEA AND VOMITING THAT IS NOT CONTROLLED WITH YOUR NAUSEA MEDICATION . *UNUSUAL SHORTNESS OF BREATH . *UNUSUAL BRUISING OR BLEEDING . *URINARY PROBLEMS (pain or burning when urinating, or frequent urination) . *BOWEL PROBLEMS (unusual diarrhea, constipation, pain near the anus) . TENDERNESS IN MOUTH AND THROAT WITH OR WITHOUT PRESENCE OF ULCERS (sore throat, sores in mouth, or a toothache) . UNUSUAL RASH, SWELLING OR PAIN  . UNUSUAL VAGINAL DISCHARGE OR ITCHING   Items with * indicate a potential emergency and should be followed up as soon as possible or go to the Emergency Department if any problems should occur.  Please show the CHEMOTHERAPY ALERT CARD or  IMMUNOTHERAPY ALERT CARD at check-in to the Emergency Department and triage nurse.  Should you have questions after your visit or need to cancel or reschedule your appointment, please contact [facility]  Dept: [phone]  and follow the prompts.  Office hours are 8:00 a.m. to 4:30 p.m. Monday - Friday. Please note that voicemails left after 4:00 p.m. may not be returned until the following business day.  We are closed weekends and major holidays. You have access to a nurse at all times for urgent questions. Please call the main number to the clinic Dept: [phone] and follow the prompts.   For any non-urgent questions, you may also contact your provider using MyChart. We now offer e-Visits for anyone 53 and older to request care online for non-urgent symptoms. For details visit [email or web address].   Also download the MyChart app! Go to the app store, search "MyChart", open the app, select [facility], and log in with your MyChart username and password.  Due to Covid, a mask is required upon entering the hospital/clinic. If you do not have a mask, one will be given to you upon arrival. For doctor visits, patients may have 1 support person aged 71 or older with them. For treatment visits, patients cannot have anyone with them due to current Covid guidelines and our immunocompromised population.   Irinotecan injection What is this medicine? IRINOTECAN (ir in oh TEE kan ) is a chemotherapy drug. It is used to treat colon and rectal cancer. This medicine may be used for other purposes; ask your health care provider or pharmacist  if you have questions. COMMON BRAND NAME(S): Camptosar What should I tell my health care provider before I take this medicine? They need to know if you have any of these conditions:  dehydration  diarrhea  infection (especially a virus infection such as chickenpox, cold sores, or herpes)  liver disease  low blood counts, like low  white cell, platelet, or red cell counts  low levels of calcium, magnesium, or potassium in the blood  recent or ongoing radiation therapy  an unusual or allergic reaction to irinotecan, other medicines, foods, dyes, or preservatives  pregnant or trying to get pregnant  breast-feeding How should I use this medicine? This drug is given as an infusion into a vein. It is administered in a hospital or clinic by a specially trained health care professional. Talk to your pediatrician regarding the use of this medicine in children. Special care may be needed. Overdosage: If you think you have taken too much of this medicine contact a poison control center or emergency room at once. NOTE: This medicine is only for you. Do not share this medicine with others. What if I miss a dose? It is important not to miss your dose. Call your doctor or health care professional if you are unable to keep an appointment. What may interact with this medicine? Do not take this medicine with any of the following medications:  cobicistat  itraconazole This medicine may interact with the following medications:  antiviral medicines for HIV or AIDS  certain antibiotics like rifampin or rifabutin  certain medicines for fungal infections like ketoconazole, posaconazole, and voriconazole  certain medicines for seizures like carbamazepine, phenobarbital, phenotoin  clarithromycin  gemfibrozil  nefazodone  St. John's Wort This list may not describe all possible interactions. Give your health care provider a list of all the medicines, herbs, non-prescription drugs, or dietary supplements you use. Also tell them if you smoke, drink alcohol, or use illegal drugs. Some items may interact with your medicine. What should I watch for while using this medicine? Your condition will be monitored carefully while you are receiving this medicine. You will need important blood work done while you are taking this  medicine. This drug may make you feel generally unwell. This is not uncommon, as chemotherapy can affect healthy cells as well as cancer cells. Report any side effects. Continue your course of treatment even though you feel ill unless your doctor tells you to stop. In some cases, you may be given additional medicines to help with side effects. Follow all directions for their use. You may get drowsy or dizzy. Do not drive, use machinery, or do anything that needs mental alertness until you know how this medicine affects you. Do not stand or sit up quickly, especially if you are an older patient. This reduces the risk of dizzy or fainting spells. Call your health care professional for advice if you get a fever, chills, or sore throat, or other symptoms of a cold or flu. Do not treat yourself. This medicine decreases your body's ability to fight infections. Try to avoid being around people who are sick. Avoid taking products that contain aspirin, acetaminophen, ibuprofen, naproxen, or ketoprofen unless instructed by your doctor. These medicines may hide a fever. This medicine may increase your risk to bruise or bleed. Call your doctor or health care professional if you notice any unusual bleeding. Be careful brushing and flossing your teeth or using a toothpick because you may get an infection or bleed more easily. If you  have any dental work done, tell your dentist you are receiving this medicine. Do not become pregnant while taking this medicine or for 6 months after stopping it. Women should inform their health care professional if they wish to become pregnant or think they might be pregnant. Men should not father a child while taking this medicine and for 3 months after stopping it. There is potential for serious side effects to an unborn child. Talk to your health care professional for more information. Do not breast-feed an infant while taking this medicine or for 7 days after stopping it. This medicine  has caused ovarian failure in some women. This medicine may make it more difficult to get pregnant. Talk to your health care professional if you are concerned about your fertility. This medicine has caused decreased sperm counts in some men. This may make it more difficult to father a child. Talk to your health care professional if you are concerned about your fertility. What side effects may I notice from receiving this medicine? Side effects that you should report to your doctor or health care professional as soon as possible:  allergic reactions like skin rash, itching or hives, swelling of the face, lips, or tongue  chest pain  diarrhea  flushing, runny nose, sweating during infusion  low blood counts - this medicine may decrease the number of white blood cells, red blood cells and platelets. You may be at increased risk for infections and bleeding.  nausea, vomiting  pain, swelling, warmth in the leg  signs of decreased platelets or bleeding - bruising, pinpoint red spots on the skin, black, tarry stools, blood in the urine  signs of infection - fever or chills, cough, sore throat, pain or difficulty passing urine  signs of decreased red blood cells - unusually weak or tired, fainting spells, lightheadedness Side effects that usually do not require medical attention (report to your doctor or health care professional if they continue or are bothersome):  constipation  hair loss  headache  loss of appetite  mouth sores  stomach pain This list may not describe all possible side effects. Call your doctor for medical advice about side effects. You may report side effects to FDA at 1-800-FDA-1088. Where should I keep my medicine? This drug is given in a hospital or clinic and will not be stored at home. NOTE: This sheet is a summary. It may not cover all possible information. If you have questions about this medicine, talk to your doctor, pharmacist, or health care provider.   2021 Elsevier/Gold Standard (2018-11-30 17:46:13)  Leucovorin injection What is this medicine? LEUCOVORIN (loo koe VOR in) is used to prevent or treat the harmful effects of some medicines. This medicine is used to treat anemia caused by a low amount of folic acid in the body. It is also used with 5-fluorouracil (5-FU) to treat colon cancer. This medicine may be used for other purposes; ask your health care provider or pharmacist if you have questions. What should I tell my health care provider before I take this medicine? They need to know if you have any of these conditions:  anemia from low levels of vitamin B-12 in the blood  an unusual or allergic reaction to leucovorin, folic acid, other medicines, foods, dyes, or preservatives  pregnant or trying to get pregnant  breast-feeding How should I use this medicine? This medicine is for injection into a muscle or into a vein. It is given by a health care professional in a hospital  or clinic setting. Talk to your pediatrician regarding the use of this medicine in children. Special care may be needed. Overdosage: If you think you have taken too much of this medicine contact a poison control center or emergency room at once. NOTE: This medicine is only for you. Do not share this medicine with others. What if I miss a dose? This does not apply. What may interact with this medicine?  capecitabine  fluorouracil  phenobarbital  phenytoin  primidone  trimethoprim-sulfamethoxazole This list may not describe all possible interactions. Give your health care provider a list of all the medicines, herbs, non-prescription drugs, or dietary supplements you use. Also tell them if you smoke, drink alcohol, or use illegal drugs. Some items may interact with your medicine. What should I watch for while using this medicine? Your condition will be monitored carefully while you are receiving this medicine. This medicine may increase the side effects  of 5-fluorouracil, 5-FU. Tell your doctor or health care professional if you have diarrhea or mouth sores that do not get better or that get worse. What side effects may I notice from receiving this medicine? Side effects that you should report to your doctor or health care professional as soon as possible:  allergic reactions like skin rash, itching or hives, swelling of the face, lips, or tongue  breathing problems  fever, infection  mouth sores  unusual bleeding or bruising  unusually weak or tired Side effects that usually do not require medical attention (report to your doctor or health care professional if they continue or are bothersome):  constipation or diarrhea  loss of appetite  nausea, vomiting This list may not describe all possible side effects. Call your doctor for medical advice about side effects. You may report side effects to FDA at 1-800-FDA-1088. Where should I keep my medicine? This drug is given in a hospital or clinic and will not be stored at home. NOTE: This sheet is a summary. It may not cover all possible information. If you have questions about this medicine, talk to your doctor, pharmacist, or health care provider.  2021 Elsevier/Gold Standard (2007-07-06 16:50:29)  Fluorouracil, 5-FU injection What is this medicine? FLUOROURACIL, 5-FU (flure oh YOOR a sil) is a chemotherapy drug. It slows the growth of cancer cells. This medicine is used to treat many types of cancer like breast cancer, colon or rectal cancer, pancreatic cancer, and stomach cancer. This medicine may be used for other purposes; ask your health care provider or pharmacist if you have questions. COMMON BRAND NAME(S): Adrucil What should I tell my health care provider before I take this medicine? They need to know if you have any of these conditions:  blood disorders  dihydropyrimidine dehydrogenase (DPD) deficiency  infection (especially a virus infection such as chickenpox, cold  sores, or herpes)  kidney disease  liver disease  malnourished, poor nutrition  recent or ongoing radiation therapy  an unusual or allergic reaction to fluorouracil, other chemotherapy, other medicines, foods, dyes, or preservatives  pregnant or trying to get pregnant  breast-feeding How should I use this medicine? This drug is given as an infusion or injection into a vein. It is administered in a hospital or clinic by a specially trained health care professional. Talk to your pediatrician regarding the use of this medicine in children. Special care may be needed. Overdosage: If you think you have taken too much of this medicine contact a poison control center or emergency room at once. NOTE: This medicine is only  for you. Do not share this medicine with others. What if I miss a dose? It is important not to miss your dose. Call your doctor or health care professional if you are unable to keep an appointment. What may interact with this medicine? Do not take this medicine with any of the following medications:  live virus vaccines This medicine may also interact with the following medications:  medicines that treat or prevent blood clots like warfarin, enoxaparin, and dalteparin This list may not describe all possible interactions. Give your health care provider a list of all the medicines, herbs, non-prescription drugs, or dietary supplements you use. Also tell them if you smoke, drink alcohol, or use illegal drugs. Some items may interact with your medicine. What should I watch for while using this medicine? Visit your doctor for checks on your progress. This drug may make you feel generally unwell. This is not uncommon, as chemotherapy can affect healthy cells as well as cancer cells. Report any side effects. Continue your course of treatment even though you feel ill unless your doctor tells you to stop. In some cases, you may be given additional medicines to help with side effects.  Follow all directions for their use. Call your doctor or health care professional for advice if you get a fever, chills or sore throat, or other symptoms of a cold or flu. Do not treat yourself. This drug decreases your body's ability to fight infections. Try to avoid being around people who are sick. This medicine may increase your risk to bruise or bleed. Call your doctor or health care professional if you notice any unusual bleeding. Be careful brushing and flossing your teeth or using a toothpick because you may get an infection or bleed more easily. If you have any dental work done, tell your dentist you are receiving this medicine. Avoid taking products that contain aspirin, acetaminophen, ibuprofen, naproxen, or ketoprofen unless instructed by your doctor. These medicines may hide a fever. Do not become pregnant while taking this medicine. Women should inform their doctor if they wish to become pregnant or think they might be pregnant. There is a potential for serious side effects to an unborn child. Talk to your health care professional or pharmacist for more information. Do not breast-feed an infant while taking this medicine. Men should inform their doctor if they wish to father a child. This medicine may lower sperm counts. Do not treat diarrhea with over the counter products. Contact your doctor if you have diarrhea that lasts more than 2 days or if it is severe and watery. This medicine can make you more sensitive to the sun. Keep out of the sun. If you cannot avoid being in the sun, wear protective clothing and use sunscreen. Do not use sun lamps or tanning beds/booths. What side effects may I notice from receiving this medicine? Side effects that you should report to your doctor or health care professional as soon as possible:  allergic reactions like skin rash, itching or hives, swelling of the face, lips, or tongue  low blood counts - this medicine may decrease the number of white  blood cells, red blood cells and platelets. You may be at increased risk for infections and bleeding.  signs of infection - fever or chills, cough, sore throat, pain or difficulty passing urine  signs of decreased platelets or bleeding - bruising, pinpoint red spots on the skin, black, tarry stools, blood in the urine  signs of decreased red blood cells - unusually  weak or tired, fainting spells, lightheadedness  breathing problems  changes in vision  chest pain  mouth sores  nausea and vomiting  pain, swelling, redness at site where injected  pain, tingling, numbness in the hands or feet  redness, swelling, or sores on hands or feet  stomach pain  unusual bleeding Side effects that usually do not require medical attention (report to your doctor or health care professional if they continue or are bothersome):  changes in finger or toe nails  diarrhea  dry or itchy skin  hair loss  headache  loss of appetite  sensitivity of eyes to the light  stomach upset  unusually teary eyes This list may not describe all possible side effects. Call your doctor for medical advice about side effects. You may report side effects to FDA at 1-800-FDA-1088. Where should I keep my medicine? This drug is given in a hospital or clinic and will not be stored at home. NOTE: This sheet is a summary. It may not cover all possible information. If you have questions about this medicine, talk to your doctor, pharmacist, or health care provider.  2021 Elsevier/Gold Standard (2018-11-30 15:00:03)

## 2020-06-12 NOTE — Progress Notes (Signed)
[name] OFFICE PROGRESS NOTE   Diagnosis: Colon cancer  INTERVAL HISTORY:   [name] completed another cycle of FOLFIRI on 05/29/2020.  No nausea/vomiting.  Mild intermittent diarrhea.  Neuropathy symptoms are improving.  He has mild rectal bleeding with a "large "stool.  He is here today with a sign language interpreter.  Objective:  Vital signs in last 24 hours:  Blood pressure (!) 144/85, pulse 88, temperature 97.7 F (36.5 C), temperature source Oral, resp. rate 18, height '5\' 10"'  (1.778 m), weight 149 lb (67.6 kg), SpO2 98 %.    HEENT: Hyperpigmentation of the tongue, no thrush or ulcers Resp: Lungs clear bilaterally Cardio: Regular rate and rhythm GI: Nontender, no hepatosplenomegaly Vascular: No leg edema  Skin: Dryness and hyperpigmentation of the hands  Portacath/PICC-without erythema  Lab Results:  Lab Results  Component Value Date   WBC 7.4 06/12/2020   HGB 10.6 (L) 06/12/2020   HCT 34.8 (L) 06/12/2020   MCV 81.9 06/12/2020   PLT 135 (L) 06/12/2020   NEUTROABS 4.3 06/12/2020    CMP  Lab Results  Component Value Date   NA 141 06/12/2020   K 3.3 (L) 06/12/2020   CL 111 06/12/2020   CO2 22 06/12/2020   GLUCOSE 84 06/12/2020   BUN 11 06/12/2020   CREATININE 1.17 06/12/2020   CALCIUM 7.7 (L) 06/12/2020   PROT 5.5 (L) 06/12/2020   ALBUMIN 3.5 06/12/2020   AST 11 (L) 06/12/2020   ALT 8 06/12/2020   ALKPHOS 78 06/12/2020   BILITOT 0.3 06/12/2020   GFRNONAA >60 06/12/2020   GFRAA >60 09/28/2019    Lab Results  Component Value Date   CEA1 1.75 09/28/2019     Medications: I have reviewed the patient's current medications.   Assessment/Plan:  1. Moderately differentiated adenocarcinoma ascending colon, stage IIIb (pT3pN1), status post a right colectomy 11/19/2018 ? Lymphovascular and perineural invasion present, 2/14 lymph nodes positive, tumor deposits present ? Positive radial margin, no loss of mismatch repair protein  expression, discussed case with Dr. Craig Staggers mass with surrounding inflammation at multiple margins, no gross residual disease, unclear which is the "positive "radial margin, further surgery and radiation not recommended ? Colonoscopy 11/19/2018-completely obstructing mid ascending colon mass, could not be passed with endoscope, biopsy confirmed invasive adenocarcinoma ? CT abdomen/pelvis 11/18/2018-wall thickening at the mid and distal ascending colon with mild distention of the proximal ascending colon and cecum ? CTs 10/17/2018--no acute findings, no chest lymphadenopathy, lungs clear ? Cycle 1 FOLFOX 12/28/2018 ? Cycle 2 FOLFOX 01/11/2019, Udenyca added ? Cycle 3 FOLFOX 01/25/2019,Udenyca held due to bone pain ? Cycle 4 FOLFOX 02/21/2019, Udenyca added ? Cycle 5 FOLFOX 03/09/2019, Udenyca ? Cycle 6 FOLFOX 03/21/2019, Udenyca ? Cycle 7 FOLFOX 04/04/2019, Udenyca ? Cycle 8 FOLFOX 04/18/2019, Udenyca ? Cycle 9 FOLFOX 05/02/2019, Udenyca ? Cycle 10 FOLFOX 05/16/2019, oxaliplatin, 5-FU bolus, and Udenyca held ? Cycle 11 FOLFOX 05/30/2019, oxaliplatin, 5-FU bolus, and Udenyca held ? Cycle 12 FOLFOX 06/15/2019, oxaliplatin, 5-FU bolus and Udenyca held ? CTs 09/29/2019-new hypodense enhancing liver masses consistent with metastatic disease, indistinct marginated nodularity below the pancreas head-likely small lymph nodes ? Biopsy liver lesion 10/18/2019-adenocarcinoma consistent with history of colorectal carcinoma ? Cycle 1 FOLFIRI/Avastin 11/01/2019 ? Cycle 2 FOLFIRI/Avastin 11/15/2019 ? Cycle 3 FOLFIRI/Avastin 11/29/2019 ? Cycle 4 FOLFIRI/Avastin 12/13/2019 ? CT chest 12/27/2019-no significant change in hepatic metastases, submassive pulmonary emboli ? Cycle 5 FOLFIRI 01/10/2020 (Avastin discontinued) ? Cycle 6 FOLFIRI 02/01/2020 ? Cycle 7 FOLFIRI 02/13/2020 ? Cycle8FOLFIRI  02/27/2020 ? CTs abdomen/pelvis 03/09/2020-per Dr. Gearldine Shown review overall stable disease ? Cycle 9 FOLFIRI 04/16/2020 ? Cycle  10 FOLFIRI 05/01/2020 ? Cycle 11 FOLFIRI 05/15/2020 ? Cycle 12 FOLFIRI 05/29/2020 ? CT abdomen/pelvis 06/07/2020- decreased size of liver lesions, no new lesions ? Cycle 13 FOLFIRI 06/12/2020  2. Deaf  3. Right epididymal cyst removal 03/22/2018 4. Asthma 5. Port-A-Cath placement, [name], 12/23/2018 6. Neutropenia secondary to chemotherapy-Udenyca added for cycle 2 FOLFOX 7. Admission with febrile neutropenia 02/08/2019 8. Hospital admission with submassive PEand left lower extremity DVTon 12/27/2019, heparin, transition to Xarelto 12/28/2019  CT chest 12/27/2019-submassive pulmonary emboli with evidence of right heart strain, no significant change in hepatic metastases    Disposition: [name] appears stable.  The restaging CT reveals improvement in the liver metastases.  The plan is to continue FOLFIRI.  He will return for an office visit and chemotherapy in 2 weeks.  He plans to receive a COVID-19 booster vaccine when he is here on 06/14/2020.  Betsy Coder, MD  06/12/2020  10:11 AM

## 2020-06-12 NOTE — Progress Notes (Signed)
Interpreter, Britt Bolognese (CAP) present to interpret.

## 2020-06-12 NOTE — Progress Notes (Signed)
0903- pt here with ASL interpretor Britt Bolognese. All procedures explained and questions answered

## 2020-06-14 ENCOUNTER — Other Ambulatory Visit (HOSPITAL_BASED_OUTPATIENT_CLINIC_OR_DEPARTMENT_OTHER): Payer: Self-pay

## 2020-06-14 ENCOUNTER — Other Ambulatory Visit: Payer: Self-pay

## 2020-06-14 ENCOUNTER — Encounter: Payer: Self-pay | Admitting: Oncology

## 2020-06-14 ENCOUNTER — Inpatient Hospital Stay: Payer: Medicare Other | Attending: Nurse Practitioner

## 2020-06-14 ENCOUNTER — Ambulatory Visit: Payer: Medicare Other | Attending: Internal Medicine

## 2020-06-14 VITALS — BP 117/58 | HR 89 | Temp 98.5°F | Resp 17

## 2020-06-14 DIAGNOSIS — Z5111 Encounter for antineoplastic chemotherapy: Secondary | ICD-10-CM | POA: Insufficient documentation

## 2020-06-14 DIAGNOSIS — C787 Secondary malignant neoplasm of liver and intrahepatic bile duct: Secondary | ICD-10-CM | POA: Insufficient documentation

## 2020-06-14 DIAGNOSIS — T451X5A Adverse effect of antineoplastic and immunosuppressive drugs, initial encounter: Secondary | ICD-10-CM | POA: Diagnosis not present

## 2020-06-14 DIAGNOSIS — D701 Agranulocytosis secondary to cancer chemotherapy: Secondary | ICD-10-CM | POA: Insufficient documentation

## 2020-06-14 DIAGNOSIS — C182 Malignant neoplasm of ascending colon: Secondary | ICD-10-CM | POA: Insufficient documentation

## 2020-06-14 DIAGNOSIS — C189 Malignant neoplasm of colon, unspecified: Secondary | ICD-10-CM

## 2020-06-14 DIAGNOSIS — Z23 Encounter for immunization: Secondary | ICD-10-CM

## 2020-06-14 MED ORDER — SODIUM CHLORIDE 0.9% FLUSH
10.0000 mL | INTRAVENOUS | Status: DC | PRN
  Administered 2020-06-14: 10 mL
  Filled 2020-06-14: qty 10

## 2020-06-14 MED ORDER — HEPARIN SOD (PORK) LOCK FLUSH 100 UNIT/ML IV SOLN
500.0000 [IU] | Freq: Once | INTRAVENOUS | Status: AC | PRN
  Administered 2020-06-14: 500 [IU]
  Filled 2020-06-14: qty 5

## 2020-06-14 MED ORDER — PFIZER-BIONT COVID-19 VAC-TRIS 30 MCG/0.3ML IM SUSP
INTRAMUSCULAR | 0 refills | Status: DC
  Filled 2020-06-14: qty 0.3, 1d supply, fill #0

## 2020-06-14 MED ORDER — PEGFILGRASTIM-CBQV 6 MG/0.6ML ~~LOC~~ SOSY
6.0000 mg | PREFILLED_SYRINGE | Freq: Once | SUBCUTANEOUS | Status: AC
  Administered 2020-06-14: 6 mg via SUBCUTANEOUS

## 2020-06-14 NOTE — Progress Notes (Signed)
   [id number]  Name:  Alexander ELMORE Sr.    MRN: [id number] DOB: [birth year]  06/14/2020  Alexander Reeves was observed post Covid-19 immunization for 15 minutes without incident. He was provided with Vaccine Information Sheet and instruction to access the V-Safe system.   Alexander Reeves was instructed to call 911 with any severe reactions post vaccine: [name] Difficulty breathing  . Swelling of face and throat  . A fast heartbeat  . A bad rash all over body  . Dizziness and weakness   Immunizations Administered    Name Date Dose VIS Date Route   PFIZER Comrnaty(Gray TOP) Covid-19 Vaccine 06/14/2020 12:11 PM 0.3 mL 12/22/2019 Intramuscular   Manufacturer: [place]   Lot: [id number]   [place]: [phone]

## 2020-06-14 NOTE — Patient Instructions (Signed)
Pegfilgrastim injection What is this medicine? PEGFILGRASTIM (PEG fil gra stim) is a long-acting granulocyte colony-stimulating factor that stimulates the growth of neutrophils, a type of white blood cell important in the body's fight against infection. It is used to reduce the incidence of fever and infection in patients with certain types of cancer who are receiving chemotherapy that affects the bone marrow, and to increase survival after being exposed to high doses of radiation. This medicine may be used for other purposes; ask your health care provider or pharmacist if you have questions. COMMON BRAND NAME(S): Fulphila, Neulasta, Nyvepria, UDENYCA, Ziextenzo What should I tell my health care provider before I take this medicine? They need to know if you have any of these conditions:  kidney disease  latex allergy  ongoing radiation therapy  sickle cell disease  skin reactions to acrylic adhesives (On-Body Injector only)  an unusual or allergic reaction to pegfilgrastim, filgrastim, other medicines, foods, dyes, or preservatives  pregnant or trying to get pregnant  breast-feeding How should I use this medicine? This medicine is for injection under the skin. If you get this medicine at home, you will be taught how to prepare and give the pre-filled syringe or how to use the On-body Injector. Refer to the patient Instructions for Use for detailed instructions. Use exactly as directed. Tell your healthcare provider immediately if you suspect that the On-body Injector may not have performed as intended or if you suspect the use of the On-body Injector resulted in a missed or partial dose. It is important that you put your used needles and syringes in a special sharps container. Do not put them in a trash can. If you do not have a sharps container, call your pharmacist or healthcare provider to get one. Talk to your pediatrician regarding the use of this medicine in children. While this drug  may be prescribed for selected conditions, precautions do apply. Overdosage: If you think you have taken too much of this medicine contact a poison control center or emergency room at once. NOTE: This medicine is only for you. Do not share this medicine with others. What if I miss a dose? It is important not to miss your dose. Call your doctor or health care professional if you miss your dose. If you miss a dose due to an On-body Injector failure or leakage, a new dose should be administered as soon as possible using a single prefilled syringe for manual use. What may interact with this medicine? Interactions have not been studied. This list may not describe all possible interactions. Give your health care provider a list of all the medicines, herbs, non-prescription drugs, or dietary supplements you use. Also tell them if you smoke, drink alcohol, or use illegal drugs. Some items may interact with your medicine. What should I watch for while using this medicine? Your condition will be monitored carefully while you are receiving this medicine. You may need blood work done while you are taking this medicine. Talk to your health care provider about your risk of cancer. You may be more at risk for certain types of cancer if you take this medicine. If you are going to need a MRI, CT scan, or other procedure, tell your doctor that you are using this medicine (On-Body Injector only). What side effects may I notice from receiving this medicine? Side effects that you should report to your doctor or health care professional as soon as possible:  allergic reactions (skin rash, itching or hives, swelling of   the face, lips, or tongue)  back pain  dizziness  fever  pain, redness, or irritation at site where injected  pinpoint red spots on the skin  red or dark-brown urine  shortness of breath or breathing problems  stomach or side pain, or pain at the shoulder  swelling  tiredness  trouble  passing urine or change in the amount of urine  unusual bruising or bleeding Side effects that usually do not require medical attention (report to your doctor or health care professional if they continue or are bothersome):  bone pain  muscle pain This list may not describe all possible side effects. Call your doctor for medical advice about side effects. You may report side effects to FDA at 1-800-FDA-1088. Where should I keep my medicine? Keep out of the reach of children. If you are using this medicine at home, you will be instructed on how to store it. Throw away any unused medicine after the expiration date on the label. NOTE: This sheet is a summary. It may not cover all possible information. If you have questions about this medicine, talk to your doctor, pharmacist, or health care provider.  2021 Elsevier/Gold Standard (2019-01-21 13:20:51)  

## 2020-06-19 ENCOUNTER — Other Ambulatory Visit (INDEPENDENT_AMBULATORY_CARE_PROVIDER_SITE_OTHER): Payer: Self-pay | Admitting: Primary Care

## 2020-06-19 DIAGNOSIS — Z760 Encounter for issue of repeat prescription: Secondary | ICD-10-CM

## 2020-06-19 DIAGNOSIS — C189 Malignant neoplasm of colon, unspecified: Secondary | ICD-10-CM

## 2020-06-21 ENCOUNTER — Other Ambulatory Visit: Payer: Self-pay

## 2020-06-21 ENCOUNTER — Ambulatory Visit (INDEPENDENT_AMBULATORY_CARE_PROVIDER_SITE_OTHER): Payer: Medicare Other | Admitting: Podiatry

## 2020-06-21 DIAGNOSIS — M79675 Pain in left toe(s): Secondary | ICD-10-CM | POA: Diagnosis not present

## 2020-06-21 DIAGNOSIS — T451X5A Adverse effect of antineoplastic and immunosuppressive drugs, initial encounter: Secondary | ICD-10-CM | POA: Diagnosis not present

## 2020-06-21 DIAGNOSIS — B351 Tinea unguium: Secondary | ICD-10-CM | POA: Diagnosis not present

## 2020-06-21 DIAGNOSIS — M7742 Metatarsalgia, left foot: Secondary | ICD-10-CM

## 2020-06-21 DIAGNOSIS — M7741 Metatarsalgia, right foot: Secondary | ICD-10-CM | POA: Diagnosis not present

## 2020-06-21 DIAGNOSIS — M79674 Pain in right toe(s): Secondary | ICD-10-CM

## 2020-06-21 DIAGNOSIS — G620 Drug-induced polyneuropathy: Secondary | ICD-10-CM | POA: Diagnosis not present

## 2020-06-24 ENCOUNTER — Other Ambulatory Visit: Payer: Self-pay | Admitting: Oncology

## 2020-06-24 ENCOUNTER — Encounter: Payer: Self-pay | Admitting: Podiatry

## 2020-06-24 NOTE — Progress Notes (Signed)
  Subjective:  Patient ID: Alexander Reeves., male    DOB: [birth year],  MRN: [id number]  Chief Complaint  Patient presents with   Nail Problem    Thick painful toenails, 4-5 month follow up    55 y.o. male returns with the above complaint. History confirmed with patient.  Doing well.  The gabapentin has helped significantly.  He is here today with an ASL interpreter in person  Objective:  Physical Exam: warm, good capillary refill, no trophic changes or ulcerative lesions, normal DP and PT pulses.  Mild discoloration with dystrophy of the toenails bilateral hallux Left Foot: Mild tenderness palpation to metatarsal heads 1-3.  No pinpoint pain to suggest sesamoiditis Right Foot: Mild tenderness to palpation to metatarsal heads 1-3.  No pinpoint pain to suggest sesamoiditis   Radiographs: X-ray of both feet: no fracture, dislocation, swelling or degenerative changes noted Assessment:   1. Pain due to onychomycosis of toenails of both feet   2. Neuropathy due to chemotherapeutic drug ([place])   3. Metatarsalgia of both feet       Plan:  Patient was evaluated and treated and all questions answered.  Discussed the etiology and treatment options for the condition in detail with the patient. Educated patient on the topical and oral treatment options for mycotic nails. Recommended debridement of the nails today. Sharp and mechanical debridement performed of all painful and mycotic nails today. Nails debrided in length and thickness using a nail nipper to level of comfort. Discussed treatment options including appropriate shoe gear. Follow up as needed for painful nails.   -Continue wearing supportive shoe gear -Continue lidocaine cream as needed -Continue gabapentin 300 mg nightly for the neuropathy pain.  We reviewed the benefits and potential risks and side effects of this. -Continue use of Penlac, I sent in refills for this  Return in about 3 months (around 09/21/2020) for RFC.

## 2020-06-25 ENCOUNTER — Inpatient Hospital Stay: Payer: Medicare Other | Admitting: Nurse Practitioner

## 2020-06-25 ENCOUNTER — Inpatient Hospital Stay: Payer: Medicare Other

## 2020-06-26 ENCOUNTER — Telehealth (INDEPENDENT_AMBULATORY_CARE_PROVIDER_SITE_OTHER): Payer: Self-pay | Admitting: Primary Care

## 2020-06-26 NOTE — Telephone Encounter (Incomplete)
Pt called to report that he needs a new breathing device and the solution that comes with it.   [facility] ([place]), [place] - [address]  [address] [place] ([place]) [place] [address]  Phone: [phone] Fax: [phone]

## 2020-06-27 ENCOUNTER — Ambulatory Visit: Payer: Medicare Other

## 2020-07-06 ENCOUNTER — Encounter: Payer: Self-pay | Admitting: General Practice

## 2020-07-06 ENCOUNTER — Inpatient Hospital Stay: Payer: Medicare Other | Admitting: General Practice

## 2020-07-06 ENCOUNTER — Other Ambulatory Visit: Payer: Self-pay

## 2020-07-06 NOTE — Progress Notes (Addendum)
[place] Social [facility]  Clinical Social Work was referred by [facility]  to review and complete healthcare advance directives.  Clinical Education officer, museum and Water engineer met with patient Alexander Reeves.  The patient designated Alexander Reeves as their primary healthcare agent and Alexander Reeves as their secondary agent.  Patient also completed healthcare living will.    Clinical Social Worker notarized documents and made copies for patient/family. Clinical Social Worker will send documents to medical records to be scanned into patient's chart. Clinical Social Worker encouraged patient/family to contact with any additional questions or concerns.  Interpretation was provided by [employer] video interpreter with patient consent.  Edwyna Shell, LCSW Clinical Social Worker Phone:  [phone]

## 2020-07-09 ENCOUNTER — Other Ambulatory Visit (INDEPENDENT_AMBULATORY_CARE_PROVIDER_SITE_OTHER): Payer: Self-pay | Admitting: Primary Care

## 2020-07-09 DIAGNOSIS — C189 Malignant neoplasm of colon, unspecified: Secondary | ICD-10-CM

## 2020-07-09 DIAGNOSIS — Z760 Encounter for issue of repeat prescription: Secondary | ICD-10-CM

## 2020-07-09 MED ORDER — VENTOLIN HFA 108 (90 BASE) MCG/ACT IN AERS: INHALATION_SPRAY | RESPIRATORY_TRACT | 2 refills | Status: DC

## 2020-07-09 NOTE — Telephone Encounter (Signed)
Requested medication (s) are due for refill today: Early  Requested medication (s) are on the active medication list: Yes  Last refill:  06/19/20  Future visit scheduled: No  Notes to clinic:  See request.    Requested Prescriptions  Pending Prescriptions Disp Refills   VENTOLIN HFA 108 (90 Base) MCG/ACT inhaler 18 g 0      Pulmonology:  Beta Agonists Failed - 07/09/2020  3:18 PM      Failed - One inhaler should last at least one month. If the patient is requesting refills earlier, contact the patient to check for uncontrolled symptoms.      Passed - Valid encounter within last 12 months    Recent Outpatient Visits           5 months ago Nonspeaking deaf   [place] [name], NP   9 months ago Nonspeaking deaf   [place] [name], NP   10 months ago Arthralgia, unspecified joint   [place] [name], NP   1 year ago Annual physical exam   [place] [name], NP   2 years ago Severe persistent asthma with acute exacerbation   [place] Elsie Stain, MD

## 2020-07-09 NOTE — Telephone Encounter (Signed)
Medication Refill - Medication: VENTOLIN HFA 108 (90 Base) MCG/ACT inhaler  Has the patient contacted their pharmacy? Yes.   (Agent: If no, request that the patient contact the pharmacy for the refill.) (Agent: If yes, when and what did the pharmacy advise?)no refills /call PCP  Preferred Pharmacy (with phone number or street name):  [facility] ([place]), [place] - [address]  [address] [facility] ([place])  [address]  Phone:  [phone]  Fax:  [phone]  Agent: Please be advised that RX refills may take up to 3 business days. We ask that you follow-up with your pharmacy.

## 2020-07-10 ENCOUNTER — Inpatient Hospital Stay: Payer: Medicare Other

## 2020-07-10 ENCOUNTER — Other Ambulatory Visit: Payer: Self-pay

## 2020-07-10 ENCOUNTER — Inpatient Hospital Stay (HOSPITAL_BASED_OUTPATIENT_CLINIC_OR_DEPARTMENT_OTHER): Payer: Medicare Other | Admitting: Oncology

## 2020-07-10 VITALS — BP 141/90 | HR 86 | Temp 97.7°F | Resp 20 | Ht 70.0 in | Wt 145.8 lb

## 2020-07-10 DIAGNOSIS — C182 Malignant neoplasm of ascending colon: Secondary | ICD-10-CM

## 2020-07-10 DIAGNOSIS — Z5111 Encounter for antineoplastic chemotherapy: Secondary | ICD-10-CM | POA: Diagnosis not present

## 2020-07-10 DIAGNOSIS — D701 Agranulocytosis secondary to cancer chemotherapy: Secondary | ICD-10-CM | POA: Diagnosis not present

## 2020-07-10 DIAGNOSIS — C787 Secondary malignant neoplasm of liver and intrahepatic bile duct: Secondary | ICD-10-CM | POA: Diagnosis not present

## 2020-07-10 DIAGNOSIS — Z760 Encounter for issue of repeat prescription: Secondary | ICD-10-CM

## 2020-07-10 DIAGNOSIS — T451X5A Adverse effect of antineoplastic and immunosuppressive drugs, initial encounter: Secondary | ICD-10-CM | POA: Diagnosis not present

## 2020-07-10 DIAGNOSIS — C189 Malignant neoplasm of colon, unspecified: Secondary | ICD-10-CM

## 2020-07-10 LAB — CBC WITH DIFFERENTIAL (CANCER CENTER ONLY)
Abs Immature Granulocytes: 0 10*3/uL (ref 0.00–0.07)
Basophils Absolute: 0.1 10*3/uL (ref 0.0–0.1)
Basophils Relative: 1 %
Eosinophils Absolute: 0.2 10*3/uL (ref 0.0–0.5)
Eosinophils Relative: 6 %
HCT: 39.7 % (ref 39.0–52.0)
Hemoglobin: 11.9 g/dL — ABNORMAL LOW (ref 13.0–17.0)
Immature Granulocytes: 0 %
Lymphocytes Relative: 37 %
Lymphs Abs: 1.4 10*3/uL (ref 0.7–4.0)
MCH: 24.9 pg — ABNORMAL LOW (ref 26.0–34.0)
MCHC: 30 g/dL (ref 30.0–36.0)
MCV: 83.1 fL (ref 80.0–100.0)
Monocytes Absolute: 0.6 10*3/uL (ref 0.1–1.0)
Monocytes Relative: 15 %
Neutro Abs: 1.6 10*3/uL — ABNORMAL LOW (ref 1.7–7.7)
Neutrophils Relative %: 41 %
Platelet Count: 195 10*3/uL (ref 150–400)
RBC: 4.78 MIL/uL (ref 4.22–5.81)
RDW: 20.5 % — ABNORMAL HIGH (ref 11.5–15.5)
WBC Count: 3.8 10*3/uL — ABNORMAL LOW (ref 4.0–10.5)
nRBC: 0 % (ref 0.0–0.2)

## 2020-07-10 LAB — CMP (CANCER CENTER ONLY)
ALT: 8 U/L (ref 0–44)
AST: 12 U/L — ABNORMAL LOW (ref 15–41)
Albumin: 4.2 g/dL (ref 3.5–5.0)
Alkaline Phosphatase: 56 U/L (ref 38–126)
Anion gap: 8 (ref 5–15)
BUN: 13 mg/dL (ref 6–20)
CO2: 24 mmol/L (ref 22–32)
Calcium: 9.2 mg/dL (ref 8.9–10.3)
Chloride: 107 mmol/L (ref 98–111)
Creatinine: 1.26 mg/dL — ABNORMAL HIGH (ref 0.61–1.24)
GFR, Estimated: >60 mL/min (ref >60)
Glucose, Bld: 108 mg/dL — ABNORMAL HIGH (ref 70–99)
Potassium: 4 mmol/L (ref 3.5–5.1)
Sodium: 139 mmol/L (ref 135–145)
Total Bilirubin: 0.4 mg/dL (ref 0.3–1.2)
Total Protein: 7.2 g/dL (ref 6.5–8.1)

## 2020-07-10 MED ORDER — SODIUM CHLORIDE 0.9 % IV SOLN
10.0000 mg | Freq: Once | INTRAVENOUS | Status: AC
  Administered 2020-07-10: 10 mg via INTRAVENOUS
  Filled 2020-07-10: qty 1

## 2020-07-10 MED ORDER — SODIUM CHLORIDE 0.9 % IV SOLN
400.0000 mg/m2 | Freq: Once | INTRAVENOUS | Status: AC
  Administered 2020-07-10: 716 mg via INTRAVENOUS
  Filled 2020-07-10: qty 35.8

## 2020-07-10 MED ORDER — ATROPINE SULFATE 1 MG/ML IJ SOLN
0.5000 mg | Freq: Once | INTRAMUSCULAR | Status: AC | PRN
  Administered 2020-07-10: 0.5 mg via INTRAVENOUS
  Filled 2020-07-10: qty 1

## 2020-07-10 MED ORDER — FLUOROURACIL CHEMO INJECTION 2.5 GM/50ML
400.0000 mg/m2 | Freq: Once | INTRAVENOUS | Status: AC
  Administered 2020-07-10: 700 mg via INTRAVENOUS
  Filled 2020-07-10: qty 14

## 2020-07-10 MED ORDER — SODIUM CHLORIDE 0.9 % IV SOLN
180.0000 mg/m2 | Freq: Once | INTRAVENOUS | Status: AC
  Administered 2020-07-10: 320 mg via INTRAVENOUS
  Filled 2020-07-10: qty 16

## 2020-07-10 MED ORDER — SODIUM CHLORIDE 0.9% FLUSH
10.0000 mL | INTRAVENOUS | Status: DC | PRN
  Administered 2020-07-10: 10 mL
  Filled 2020-07-10: qty 10

## 2020-07-10 MED ORDER — MONTELUKAST SODIUM 10 MG PO TABS: 10.0000 mg | ORAL_TABLET | Freq: Every day | ORAL | 1 refills | Status: DC

## 2020-07-10 MED ORDER — PALONOSETRON HCL INJECTION 0.25 MG/5ML
0.2500 mg | Freq: Once | INTRAVENOUS | Status: AC
  Administered 2020-07-10: 0.25 mg via INTRAVENOUS
  Filled 2020-07-10: qty 5

## 2020-07-10 MED ORDER — SODIUM CHLORIDE 0.9 % IV SOLN
2400.0000 mg/m2 | INTRAVENOUS | Status: DC
  Administered 2020-07-10: 4300 mg via INTRAVENOUS
  Filled 2020-07-10: qty 86

## 2020-07-10 MED ORDER — SODIUM CHLORIDE 0.9 % IV SOLN
Freq: Once | INTRAVENOUS | Status: AC
  Filled 2020-07-10: qty 250

## 2020-07-10 NOTE — Progress Notes (Signed)
Alexander Reeves   Diagnosis: Colon cancer  INTERVAL HISTORY:   Alexander Reeves returns as scheduled.  He missed the last cycle of chemotherapy due to difficulty with transportation.  He feels well.  The anal fissure has healed.  No bleeding.  No new complaint.  Objective:  Vital signs in last 24 hours:  Blood pressure (!) 141/90, pulse 86, temperature 97.7 F (36.5 C), temperature source Oral, resp. rate 20, height '5\' 10"'  (1.778 m), weight 145 lb 12.8 oz (66.1 kg), SpO2 100 %.    HEENT: Hyperpigmentation of the buccal mucosa, no ulcers, mild white coat over the tongue Resp: Lungs clear bilaterally Cardio: Regular rate and rhythm GI: No hepatomegaly, nontender Vascular: No leg edema  Skin: Hyperpigmentation, skin thickening, and dryness of the palms  Portacath/PICC-without erythema  Lab Results:  Lab Results  Component Value Date   WBC 3.8 (L) 07/10/2020   HGB 11.9 (L) 07/10/2020   HCT 39.7 07/10/2020   MCV 83.1 07/10/2020   PLT 195 07/10/2020   NEUTROABS 1.6 (L) 07/10/2020    CMP  Lab Results  Component Value Date   NA 139 07/10/2020   K 4.0 07/10/2020   CL 107 07/10/2020   CO2 24 07/10/2020   GLUCOSE 108 (H) 07/10/2020   BUN 13 07/10/2020   CREATININE 1.26 (H) 07/10/2020   CALCIUM 9.2 07/10/2020   PROT 7.2 07/10/2020   ALBUMIN 4.2 07/10/2020   AST 12 (L) 07/10/2020   ALT 8 07/10/2020   ALKPHOS 56 07/10/2020   BILITOT 0.4 07/10/2020   GFRNONAA >60 07/10/2020   GFRAA >60 09/28/2019    Lab Results  Component Value Date   CEA1 1.75 09/28/2019    Medications: I have reviewed the patient's current medications.   Assessment/Plan: Moderately differentiated adenocarcinoma ascending colon, stage IIIb (pT3pN1), status post a right colectomy 11/19/2018 Lymphovascular and perineural invasion present, 2/14 lymph nodes positive, tumor deposits present Positive radial margin, no loss of mismatch repair protein expression, discussed  case with Dr. Craig Staggers mass with surrounding inflammation at multiple margins, no gross residual disease, unclear which is the "positive "radial margin, further surgery and radiation not recommended Colonoscopy 11/19/2018-completely obstructing mid ascending colon mass, could not be passed with endoscope, biopsy confirmed invasive adenocarcinoma CT abdomen/pelvis 11/18/2018-wall thickening at the mid and distal ascending colon with mild distention of the proximal ascending colon and cecum CTs 10/17/2018--no acute findings, no chest lymphadenopathy, lungs clear Cycle 1 FOLFOX 12/28/2018 Cycle 2 FOLFOX 01/11/2019, Udenyca added  Cycle 3 FOLFOX 01/25/2019, Udenyca held due to bone pain Cycle 4 FOLFOX 02/21/2019, Udenyca added Cycle 5 FOLFOX 03/09/2019, Udenyca Cycle 6 FOLFOX 03/21/2019, Udenyca Cycle 7 FOLFOX 04/04/2019, Udenyca Cycle 8 FOLFOX 04/18/2019, Udenyca  Cycle 9 FOLFOX 05/02/2019, Udenyca Cycle 10 FOLFOX 05/16/2019, oxaliplatin, 5-FU bolus, and Udenyca held Cycle 11 FOLFOX 05/30/2019, oxaliplatin, 5-FU bolus, and Udenyca held Cycle 12 FOLFOX 06/15/2019, oxaliplatin, 5-FU bolus and Udenyca held CTs 09/29/2019-new hypodense enhancing liver masses consistent with metastatic disease, indistinct marginated nodularity below the pancreas head-likely small lymph nodes Biopsy liver lesion 10/18/2019-adenocarcinoma consistent with history of colorectal carcinoma Cycle 1 FOLFIRI/Avastin 11/01/2019 Cycle 2 FOLFIRI/Avastin 11/15/2019 Cycle 3 FOLFIRI/Avastin 11/29/2019 Cycle 4 FOLFIRI/Avastin 12/13/2019 CT chest 12/27/2019-no significant change in hepatic metastases, submassive pulmonary emboli Cycle 5 FOLFIRI 01/10/2020 (Avastin discontinued) Cycle 6 FOLFIRI 02/01/2020 Cycle 7 FOLFIRI 02/13/2020 Cycle 8 FOLFIRI 02/27/2020 CTs abdomen/pelvis 03/09/2020-per Dr. Gearldine Shown review overall stable disease Cycle 9 FOLFIRI 04/16/2020 Cycle 10 FOLFIRI 05/01/2020 Cycle 11 FOLFIRI 05/15/2020 Cycle 12  FOLFIRI 05/29/2020 CT  abdomen/pelvis 06/07/2020- decreased size of liver lesions, no new lesions Cycle 13 FOLFIRI 06/12/2020 Cycle 14 FOLFIRI 07/02/2020   Deaf  Right epididymal cyst removal 03/22/2018 Asthma Port-A-Cath placement, [name], 12/23/2018 Neutropenia secondary to chemotherapy-Udenyca added for cycle 2 FOLFOX Admission with febrile neutropenia 02/08/2019 Hospital admission with submassive PE and left lower extremity DVT on 12/27/2019, heparin, transition to Xarelto 12/28/2019 CT chest 12/27/2019-submassive pulmonary emboli with evidence of right heart strain, no significant change in hepatic metastases      Disposition: Alexander Reeves appears stable.  He will complete another cycle of FOLFIRI today.  The plan is to schedule a restaging CT for late August.  He will return for an office visit and chemotherapy in 2 weeks.  Betsy Coder, MD  07/10/2020  10:04 AM

## 2020-07-10 NOTE — Patient Instructions (Signed)
Alexander Reeves  Discharge Instructions: Thank you for choosing [facility] to provide your oncology and hematology care.   If you have a lab appointment with the [facility], please go directly to the [facility] and check in at the registration area.   Wear comfortable clothing and clothing appropriate for easy access to any Portacath or PICC line.   We strive to give you quality time with your provider. You may need to reschedule your appointment if you arrive late (15 or more minutes).  Arriving late affects you and other patients whose appointments are after yours.  Also, if you miss three or more appointments without notifying the office, you may be dismissed from the clinic at the provider's discretion.      For prescription refill requests, have your pharmacy contact our office and allow 72 hours for refills to be completed.    Today you received the following chemotherapy and/or immunotherapy agents: Irinotecan, Leucovorin, 5FU  To help prevent nausea and vomiting after your treatment, we encourage you to take your nausea medication as directed.  BELOW ARE SYMPTOMS THAT SHOULD BE REPORTED IMMEDIATELY: *FEVER GREATER THAN 100.4 F (38 C) OR HIGHER *CHILLS OR SWEATING *NAUSEA AND VOMITING THAT IS NOT CONTROLLED WITH YOUR NAUSEA MEDICATION *UNUSUAL SHORTNESS OF BREATH *UNUSUAL BRUISING OR BLEEDING *URINARY PROBLEMS (pain or burning when urinating, or frequent urination) *BOWEL PROBLEMS (unusual diarrhea, constipation, pain near the anus) TENDERNESS IN MOUTH AND THROAT WITH OR WITHOUT PRESENCE OF ULCERS (sore throat, sores in mouth, or a toothache) UNUSUAL RASH, SWELLING OR PAIN  UNUSUAL VAGINAL DISCHARGE OR ITCHING   Items with * indicate a potential emergency and should be followed up as soon as possible or go to the Emergency Department if any problems should occur.  Please show the CHEMOTHERAPY ALERT CARD or IMMUNOTHERAPY ALERT CARD at  check-in to the Emergency Department and triage nurse.  Should you have questions after your visit or need to cancel or reschedule your appointment, please contact [facility]  Dept: [phone]  and follow the prompts.  Office hours are 8:00 a.m. to 4:30 p.m. Monday - Friday. Please note that voicemails left after 4:00 p.m. may not be returned until the following business day.  We are closed weekends and major holidays. You have access to a nurse at all times for urgent questions. Please call the main number to the clinic Dept: [phone] and follow the prompts.   For any non-urgent questions, you may also contact your provider using MyChart. We now offer e-Visits for anyone 8 and older to request care online for non-urgent symptoms. For details visit [email or web address].   Also download the MyChart app! Go to the app store, search "MyChart", open the app, select [facility], and log in with your MyChart username and password.  Due to Covid, a mask is required upon entering the hospital/clinic. If you do not have a mask, one will be given to you upon arrival. For doctor visits, patients may have 1 support person aged 7 or older with them. For treatment visits, patients cannot have anyone with them due to current Covid guidelines and our immunocompromised population.   Irinotecan injection What is this medicine? IRINOTECAN (ir in oh TEE kan ) is a chemotherapy drug. It is used to treat colon and rectal cancer. This medicine may be used for other purposes; ask your health care provider or pharmacist if you have questions. COMMON BRAND NAME(S): Camptosar What should  I tell my health care provider before I take this medicine? They need to know if you have any of these conditions: dehydration diarrhea infection (especially a virus infection such as chickenpox, cold sores, or herpes) liver disease low blood counts, like low white cell, platelet, or red cell  counts low levels of calcium, magnesium, or potassium in the blood recent or ongoing radiation therapy an unusual or allergic reaction to irinotecan, other medicines, foods, dyes, or preservatives pregnant or trying to get pregnant breast-feeding How should I use this medicine? This drug is given as an infusion into a vein. It is administered in a hospital or clinic by a specially trained health care professional. Talk to your pediatrician regarding the use of this medicine in children. Special care may be needed. Overdosage: If you think you have taken too much of this medicine contact a poison control center or emergency room at once. NOTE: This medicine is only for you. Do not share this medicine with others. What if I miss a dose? It is important not to miss your dose. Call your doctor or health care professional if you are unable to keep an appointment. What may interact with this medicine? Do not take this medicine with any of the following medications: cobicistat itraconazole This medicine may interact with the following medications: antiviral medicines for HIV or AIDS certain antibiotics like rifampin or rifabutin certain medicines for fungal infections like ketoconazole, posaconazole, and voriconazole certain medicines for seizures like carbamazepine, phenobarbital, phenotoin clarithromycin gemfibrozil nefazodone [facility]. John's Wort This list may not describe all possible interactions. Give your health care provider a list of all the medicines, herbs, non-prescription drugs, or dietary supplements you use. Also tell them if you smoke, drink alcohol, or use illegal drugs. Some items may interact with your medicine. What should I watch for while using this medicine? Your condition will be monitored carefully while you are receiving this medicine. You will need important blood work done while you are taking this medicine. This drug may make you feel generally unwell. This is not  uncommon, as chemotherapy can affect healthy cells as well as cancer cells. Report any side effects. Continue your course of treatment even though you feel ill unless your doctor tells you to stop. In some cases, you may be given additional medicines to help with side effects. Follow all directions for their use. You may get drowsy or dizzy. Do not drive, use machinery, or do anything that needs mental alertness until you know how this medicine affects you. Do not stand or sit up quickly, especially if you are an older patient. This reduces the risk of dizzy or fainting spells. Call your health care professional for advice if you get a fever, chills, or sore throat, or other symptoms of a cold or flu. Do not treat yourself. This medicine decreases your body's ability to fight infections. Try to avoid being around people who are sick. Avoid taking products that contain aspirin, acetaminophen, ibuprofen, naproxen, or ketoprofen unless instructed by your doctor. These medicines may hide a fever. This medicine may increase your risk to bruise or bleed. Call your doctor or health care professional if you notice any unusual bleeding. Be careful brushing and flossing your teeth or using a toothpick because you may get an infection or bleed more easily. If you have any dental work done, tell your dentist you are receiving this medicine. Do not become pregnant while taking this medicine or for 6 months after stopping it. Women should  inform their health care professional if they wish to become pregnant or think they might be pregnant. Men should not father a child while taking this medicine and for 3 months after stopping it. There is potential for serious side effects to an unborn child. Talk to your health care professional for more information. Do not breast-feed an infant while taking this medicine or for 7 days after stopping it. This medicine has caused ovarian failure in some women. This medicine may make it  more difficult to get pregnant. Talk to your health care professional if you are concerned about your fertility. This medicine has caused decreased sperm counts in some men. This may make it more difficult to father a child. Talk to your health care professional if you are concerned about your fertility. What side effects may I notice from receiving this medicine? Side effects that you should report to your doctor or health care professional as soon as possible: allergic reactions like skin rash, itching or hives, swelling of the face, lips, or tongue chest pain diarrhea flushing, runny nose, sweating during infusion low blood counts - this medicine may decrease the number of white blood cells, red blood cells and platelets. You may be at increased risk for infections and bleeding. nausea, vomiting pain, swelling, warmth in the leg signs of decreased platelets or bleeding - bruising, pinpoint red spots on the skin, black, tarry stools, blood in the urine signs of infection - fever or chills, cough, sore throat, pain or difficulty passing urine signs of decreased red blood cells - unusually weak or tired, fainting spells, lightheadedness Side effects that usually do not require medical attention (report to your doctor or health care professional if they continue or are bothersome): constipation hair loss headache loss of appetite mouth sores stomach pain This list may not describe all possible side effects. Call your doctor for medical advice about side effects. You may report side effects to FDA at 1-800-FDA-1088. Where should I keep my medicine? This drug is given in a hospital or clinic and will not be stored at home. NOTE: This sheet is a summary. It may not cover all possible information. If you have questions about this medicine, talk to your doctor, pharmacist, or health care provider.  2021 Elsevier/Gold Standard (2018-11-30 17:46:13)  Leucovorin injection What is this  medicine? LEUCOVORIN (loo koe VOR in) is used to prevent or treat the harmful effects of some medicines. This medicine is used to treat anemia caused by a low amount of folic acid in the body. It is also used with 5-fluorouracil (5-FU) to treat colon cancer. This medicine may be used for other purposes; ask your health care provider or pharmacist if you have questions. What should I tell my health care provider before I take this medicine? They need to know if you have any of these conditions: anemia from low levels of vitamin B-12 in the blood an unusual or allergic reaction to leucovorin, folic acid, other medicines, foods, dyes, or preservatives pregnant or trying to get pregnant breast-feeding How should I use this medicine? This medicine is for injection into a muscle or into a vein. It is given by a health care professional in a hospital or clinic setting. Talk to your pediatrician regarding the use of this medicine in children. Special care may be needed. Overdosage: If you think you have taken too much of this medicine contact a poison control center or emergency room at once. NOTE: This medicine is only for you. Do  not share this medicine with others. What if I miss a dose? This does not apply. What may interact with this medicine? capecitabine fluorouracil phenobarbital phenytoin primidone trimethoprim-sulfamethoxazole This list may not describe all possible interactions. Give your health care provider a list of all the medicines, herbs, non-prescription drugs, or dietary supplements you use. Also tell them if you smoke, drink alcohol, or use illegal drugs. Some items may interact with your medicine. What should I watch for while using this medicine? Your condition will be monitored carefully while you are receiving this medicine. This medicine may increase the side effects of 5-fluorouracil, 5-FU. Tell your doctor or health care professional if you have diarrhea or mouth sores  that do not get better or that get worse. What side effects may I notice from receiving this medicine? Side effects that you should report to your doctor or health care professional as soon as possible: allergic reactions like skin rash, itching or hives, swelling of the face, lips, or tongue breathing problems fever, infection mouth sores unusual bleeding or bruising unusually weak or tired Side effects that usually do not require medical attention (report to your doctor or health care professional if they continue or are bothersome): constipation or diarrhea loss of appetite nausea, vomiting This list may not describe all possible side effects. Call your doctor for medical advice about side effects. You may report side effects to FDA at 1-800-FDA-1088. Where should I keep my medicine? This drug is given in a hospital or clinic and will not be stored at home. NOTE: This sheet is a summary. It may not cover all possible information. If you have questions about this medicine, talk to your doctor, pharmacist, or health care provider.  2021 Elsevier/Gold Standard (2007-07-06 16:50:29)  Fluorouracil, 5-FU injection What is this medicine? FLUOROURACIL, 5-FU (flure oh YOOR a sil) is a chemotherapy drug. It slows the growth of cancer cells. This medicine is used to treat many types of cancer like breast cancer, colon or rectal cancer, pancreatic cancer, and stomach cancer. This medicine may be used for other purposes; ask your health care provider or pharmacist if you have questions. COMMON BRAND NAME(S): Adrucil What should I tell my health care provider before I take this medicine? They need to know if you have any of these conditions: blood disorders dihydropyrimidine dehydrogenase (DPD) deficiency infection (especially a virus infection such as chickenpox, cold sores, or herpes) kidney disease liver disease malnourished, poor nutrition recent or ongoing radiation therapy an unusual or  allergic reaction to fluorouracil, other chemotherapy, other medicines, foods, dyes, or preservatives pregnant or trying to get pregnant breast-feeding How should I use this medicine? This drug is given as an infusion or injection into a vein. It is administered in a hospital or clinic by a specially trained health care professional. Talk to your pediatrician regarding the use of this medicine in children. Special care may be needed. Overdosage: If you think you have taken too much of this medicine contact a poison control center or emergency room at once. NOTE: This medicine is only for you. Do not share this medicine with others. What if I miss a dose? It is important not to miss your dose. Call your doctor or health care professional if you are unable to keep an appointment. What may interact with this medicine? Do not take this medicine with any of the following medications: live virus vaccines This medicine may also interact with the following medications: medicines that treat or prevent blood clots like  warfarin, enoxaparin, and dalteparin This list may not describe all possible interactions. Give your health care provider a list of all the medicines, herbs, non-prescription drugs, or dietary supplements you use. Also tell them if you smoke, drink alcohol, or use illegal drugs. Some items may interact with your medicine. What should I watch for while using this medicine? Visit your doctor for checks on your progress. This drug may make you feel generally unwell. This is not uncommon, as chemotherapy can affect healthy cells as well as cancer cells. Report any side effects. Continue your course of treatment even though you feel ill unless your doctor tells you to stop. In some cases, you may be given additional medicines to help with side effects. Follow all directions for their use. Call your doctor or health care professional for advice if you get a fever, chills or sore throat, or other  symptoms of a cold or flu. Do not treat yourself. This drug decreases your body's ability to fight infections. Try to avoid being around people who are sick. This medicine may increase your risk to bruise or bleed. Call your doctor or health care professional if you notice any unusual bleeding. Be careful brushing and flossing your teeth or using a toothpick because you may get an infection or bleed more easily. If you have any dental work done, tell your dentist you are receiving this medicine. Avoid taking products that contain aspirin, acetaminophen, ibuprofen, naproxen, or ketoprofen unless instructed by your doctor. These medicines may hide a fever. Do not become pregnant while taking this medicine. Women should inform their doctor if they wish to become pregnant or think they might be pregnant. There is a potential for serious side effects to an unborn child. Talk to your health care professional or pharmacist for more information. Do not breast-feed an infant while taking this medicine. Men should inform their doctor if they wish to father a child. This medicine may lower sperm counts. Do not treat diarrhea with over the counter products. Contact your doctor if you have diarrhea that lasts more than 2 days or if it is severe and watery. This medicine can make you more sensitive to the sun. Keep out of the sun. If you cannot avoid being in the sun, wear protective clothing and use sunscreen. Do not use sun lamps or tanning beds/booths. What side effects may I notice from receiving this medicine? Side effects that you should report to your doctor or health care professional as soon as possible: allergic reactions like skin rash, itching or hives, swelling of the face, lips, or tongue low blood counts - this medicine may decrease the number of white blood cells, red blood cells and platelets. You may be at increased risk for infections and bleeding. signs of infection - fever or chills, cough, sore  throat, pain or difficulty passing urine signs of decreased platelets or bleeding - bruising, pinpoint red spots on the skin, black, tarry stools, blood in the urine signs of decreased red blood cells - unusually weak or tired, fainting spells, lightheadedness breathing problems changes in vision chest pain mouth sores nausea and vomiting pain, swelling, redness at site where injected pain, tingling, numbness in the hands or feet redness, swelling, or sores on hands or feet stomach pain unusual bleeding Side effects that usually do not require medical attention (report to your doctor or health care professional if they continue or are bothersome): changes in finger or toe nails diarrhea dry or itchy skin hair loss headache  loss of appetite sensitivity of eyes to the light stomach upset unusually teary eyes This list may not describe all possible side effects. Call your doctor for medical advice about side effects. You may report side effects to FDA at 1-800-FDA-1088. Where should I keep my medicine? This drug is given in a hospital or clinic and will not be stored at home. NOTE: This sheet is a summary. It may not cover all possible information. If you have questions about this medicine, talk to your doctor, pharmacist, or health care provider.  2021 Elsevier/Gold Standard (2018-11-30 15:00:03)

## 2020-07-10 NOTE — Progress Notes (Signed)
Patient was provided a case of Ensure today.

## 2020-07-10 NOTE — Progress Notes (Signed)
Interpreter Natale Milch present for patient's appointments today.

## 2020-07-10 NOTE — Patient Instructions (Signed)
Implanted Port Home Guide An implanted port is a device that is placed under the skin. It is usually placed in the chest. The device can be used to give IV medicine, to take blood, or for dialysis. You may have an implanted port if: You need IV medicine that would be irritating to the small veins in your hands or arms. You need IV medicines, such as antibiotics, for a long period of time. You need IV nutrition for a long period of time. You need dialysis. When you have a port, your health care provider can choose to use the port instead of veins in your arms for these procedures. You may have fewer limitations when using a port than you would if you used other types of long-term IVs, and you will likely be able to return to normal activities afteryour incision heals. An implanted port has two main parts: Reservoir. The reservoir is the part where a needle is inserted to give medicines or draw blood. The reservoir is round. After it is placed, it appears as a small, raised area under your skin. Catheter. The catheter is a thin, flexible tube that connects the reservoir to a vein. Medicine that is inserted into the reservoir goes into the catheter and then into the vein. How is my port accessed? To access your port: A numbing cream may be placed on the skin over the port site. Your health care provider will put on a mask and sterile gloves. The skin over your port will be cleaned carefully with a germ-killing soap and allowed to dry. Your health care provider will gently pinch the port and insert a needle into it. Your health care provider will check for a blood return to make sure the port is in the vein and is not clogged. If your port needs to remain accessed to get medicine continuously (constant infusion), your health care provider will place a clear bandage (dressing) over the needle site. The dressing and needle will need to be changed every week, or as told by your health care provider. What  is flushing? Flushing helps keep the port from getting clogged. Follow instructions from your health care provider about how and when to flush the port. Ports are usually flushed with saline solution or a medicine called heparin. The need for flushing will depend on how the port is used: If the port is only used from time to time to give medicines or draw blood, the port may need to be flushed: Before and after medicines have been given. Before and after blood has been drawn. As part of routine maintenance. Flushing may be recommended every 4-6 weeks. If a constant infusion is running, the port may not need to be flushed. Throw away any syringes in a disposal container that is meant for sharp items (sharps container). You can buy a sharps container from a pharmacy, or you can make one by using an empty hard plastic bottle with a cover. How long will my port stay implanted? The port can stay in for as long as your health care provider thinks it is needed. When it is time for the port to come out, a surgery will be done to remove it. The surgery will be similar to the procedure that was done to putthe port in. Follow these instructions at home:  Flush your port as told by your health care provider. If you need an infusion over several days, follow instructions from your health care provider about how to take   care of your port site. Make sure you: Wash your hands with soap and water before you change your dressing. If soap and water are not available, use alcohol-based hand sanitizer. Change your dressing as told by your health care provider. Place any used dressings or infusion bags into a plastic bag. Throw that bag in the trash. Keep the dressing that covers the needle clean and dry. Do not get it wet. Do not use scissors or sharp objects near the tube. Keep the tube clamped, unless it is being used. Check your port site every day for signs of infection. Check for: Redness, swelling, or  pain. Fluid or blood. Pus or a bad smell. Protect the skin around the port site. Avoid wearing bra straps that rub or irritate the site. Protect the skin around your port from seat belts. Place a soft pad over your chest if needed. Bathe or shower as told by your health care provider. The site may get wet as long as you are not actively receiving an infusion. Return to your normal activities as told by your health care provider. Ask your health care provider what activities are safe for you. Carry a medical alert card or wear a medical alert bracelet at all times. This will let health care providers know that you have an implanted port in case of an emergency. Get help right away if: You have redness, swelling, or pain at the port site. You have fluid or blood coming from your port site. You have pus or a bad smell coming from the port site. You have a fever. Summary Implanted ports are usually placed in the chest for long-term IV access. Follow instructions from your health care provider about flushing the port and changing bandages (dressings). Take care of the area around your port by avoiding clothing that puts pressure on the area, and by watching for signs of infection. Protect the skin around your port from seat belts. Place a soft pad over your chest if needed. Get help right away if you have a fever or you have redness, swelling, pain, drainage, or a bad smell at the port site. This information is not intended to replace advice given to you by your health care provider. Make sure you discuss any questions you have with your healthcare provider. Document Revised: 05/16/2019 Document Reviewed: 05/16/2019 Elsevier Patient Education  2022 [employer]  

## 2020-07-11 ENCOUNTER — Encounter: Payer: Self-pay | Admitting: Oncology

## 2020-07-12 ENCOUNTER — Other Ambulatory Visit: Payer: Self-pay

## 2020-07-12 ENCOUNTER — Inpatient Hospital Stay: Payer: Medicare Other

## 2020-07-12 VITALS — BP 127/89 | HR 90 | Resp 20

## 2020-07-12 DIAGNOSIS — C189 Malignant neoplasm of colon, unspecified: Secondary | ICD-10-CM

## 2020-07-12 DIAGNOSIS — Z5111 Encounter for antineoplastic chemotherapy: Secondary | ICD-10-CM | POA: Diagnosis not present

## 2020-07-12 DIAGNOSIS — D701 Agranulocytosis secondary to cancer chemotherapy: Secondary | ICD-10-CM | POA: Diagnosis not present

## 2020-07-12 DIAGNOSIS — C787 Secondary malignant neoplasm of liver and intrahepatic bile duct: Secondary | ICD-10-CM | POA: Diagnosis not present

## 2020-07-12 DIAGNOSIS — T451X5A Adverse effect of antineoplastic and immunosuppressive drugs, initial encounter: Secondary | ICD-10-CM | POA: Diagnosis not present

## 2020-07-12 DIAGNOSIS — C182 Malignant neoplasm of ascending colon: Secondary | ICD-10-CM

## 2020-07-12 MED ORDER — SODIUM CHLORIDE 0.9% FLUSH
10.0000 mL | INTRAVENOUS | Status: DC | PRN
  Administered 2020-07-12: 10 mL
  Filled 2020-07-12: qty 10

## 2020-07-12 MED ORDER — PEGFILGRASTIM-CBQV 6 MG/0.6ML ~~LOC~~ SOSY
6.0000 mg | PREFILLED_SYRINGE | Freq: Once | SUBCUTANEOUS | Status: AC
  Administered 2020-07-12: 6 mg via SUBCUTANEOUS

## 2020-07-12 MED ORDER — HEPARIN SOD (PORK) LOCK FLUSH 100 UNIT/ML IV SOLN
500.0000 [IU] | Freq: Once | INTRAVENOUS | Status: AC | PRN
  Administered 2020-07-12: 500 [IU]
  Filled 2020-07-12: qty 5

## 2020-07-12 NOTE — Patient Instructions (Signed)

## 2020-07-22 ENCOUNTER — Other Ambulatory Visit: Payer: Self-pay | Admitting: Oncology

## 2020-07-24 ENCOUNTER — Inpatient Hospital Stay (HOSPITAL_BASED_OUTPATIENT_CLINIC_OR_DEPARTMENT_OTHER): Payer: Medicare Other | Admitting: Nurse Practitioner

## 2020-07-24 ENCOUNTER — Inpatient Hospital Stay: Payer: Medicare Other

## 2020-07-24 ENCOUNTER — Encounter: Payer: Self-pay | Admitting: Nurse Practitioner

## 2020-07-24 ENCOUNTER — Inpatient Hospital Stay: Payer: Medicare Other | Attending: Nurse Practitioner

## 2020-07-24 ENCOUNTER — Other Ambulatory Visit: Payer: Self-pay

## 2020-07-24 VITALS — BP 124/83 | HR 84 | Temp 97.7°F | Resp 18 | Ht 70.0 in | Wt 150.2 lb

## 2020-07-24 DIAGNOSIS — C189 Malignant neoplasm of colon, unspecified: Secondary | ICD-10-CM

## 2020-07-24 DIAGNOSIS — C182 Malignant neoplasm of ascending colon: Secondary | ICD-10-CM

## 2020-07-24 DIAGNOSIS — Z5111 Encounter for antineoplastic chemotherapy: Secondary | ICD-10-CM | POA: Diagnosis not present

## 2020-07-24 DIAGNOSIS — D701 Agranulocytosis secondary to cancer chemotherapy: Secondary | ICD-10-CM | POA: Insufficient documentation

## 2020-07-24 DIAGNOSIS — Z95828 Presence of other vascular implants and grafts: Secondary | ICD-10-CM

## 2020-07-24 DIAGNOSIS — C7989 Secondary malignant neoplasm of other specified sites: Secondary | ICD-10-CM | POA: Diagnosis not present

## 2020-07-24 DIAGNOSIS — C787 Secondary malignant neoplasm of liver and intrahepatic bile duct: Secondary | ICD-10-CM | POA: Diagnosis not present

## 2020-07-24 DIAGNOSIS — C779 Secondary and unspecified malignant neoplasm of lymph node, unspecified: Secondary | ICD-10-CM | POA: Diagnosis not present

## 2020-07-24 LAB — CMP (CANCER CENTER ONLY)
ALT: 8 U/L (ref 0–44)
AST: 11 U/L — ABNORMAL LOW (ref 15–41)
Albumin: 4 g/dL (ref 3.5–5.0)
Alkaline Phosphatase: 76 U/L (ref 38–126)
Anion gap: 7 (ref 5–15)
BUN: 11 mg/dL (ref 6–20)
CO2: 26 mmol/L (ref 22–32)
Calcium: 8.9 mg/dL (ref 8.9–10.3)
Chloride: 108 mmol/L (ref 98–111)
Creatinine: 1.28 mg/dL — ABNORMAL HIGH (ref 0.61–1.24)
GFR, Estimated: >60 mL/min (ref >60)
Glucose, Bld: 97 mg/dL (ref 70–99)
Potassium: 4 mmol/L (ref 3.5–5.1)
Sodium: 141 mmol/L (ref 135–145)
Total Bilirubin: 0.3 mg/dL (ref 0.3–1.2)
Total Protein: 6.5 g/dL (ref 6.5–8.1)

## 2020-07-24 LAB — CBC WITH DIFFERENTIAL (CANCER CENTER ONLY)
Abs Immature Granulocytes: 0.02 10*3/uL (ref 0.00–0.07)
Basophils Absolute: 0 10*3/uL (ref 0.0–0.1)
Basophils Relative: 1 %
Eosinophils Absolute: 0.3 10*3/uL (ref 0.0–0.5)
Eosinophils Relative: 5 %
HCT: 37 % — ABNORMAL LOW (ref 39.0–52.0)
Hemoglobin: 11.1 g/dL — ABNORMAL LOW (ref 13.0–17.0)
Immature Granulocytes: 0 %
Lymphocytes Relative: 28 %
Lymphs Abs: 1.5 10*3/uL (ref 0.7–4.0)
MCH: 24.4 pg — ABNORMAL LOW (ref 26.0–34.0)
MCHC: 30 g/dL (ref 30.0–36.0)
MCV: 81.5 fL (ref 80.0–100.0)
Monocytes Absolute: 0.3 10*3/uL (ref 0.1–1.0)
Monocytes Relative: 6 %
Neutro Abs: 3.2 10*3/uL (ref 1.7–7.7)
Neutrophils Relative %: 60 %
Platelet Count: 184 10*3/uL (ref 150–400)
RBC: 4.54 MIL/uL (ref 4.22–5.81)
RDW: 19.9 % — ABNORMAL HIGH (ref 11.5–15.5)
WBC Count: 5.4 10*3/uL (ref 4.0–10.5)
nRBC: 0 % (ref 0.0–0.2)

## 2020-07-24 MED ORDER — SODIUM CHLORIDE 0.9 % IV SOLN
2400.0000 mg/m2 | INTRAVENOUS | Status: DC
  Administered 2020-07-24: 4300 mg via INTRAVENOUS
  Filled 2020-07-24: qty 86

## 2020-07-24 MED ORDER — SODIUM CHLORIDE 0.9% FLUSH
10.0000 mL | INTRAVENOUS | Status: DC | PRN
  Administered 2020-07-24: 10 mL
  Filled 2020-07-24: qty 10

## 2020-07-24 MED ORDER — SODIUM CHLORIDE 0.9 % IV SOLN
Freq: Once | INTRAVENOUS | Status: AC
  Filled 2020-07-24: qty 250

## 2020-07-24 MED ORDER — SODIUM CHLORIDE 0.9 % IV SOLN
180.0000 mg/m2 | Freq: Once | INTRAVENOUS | Status: AC
  Administered 2020-07-24: 320 mg via INTRAVENOUS
  Filled 2020-07-24: qty 16

## 2020-07-24 MED ORDER — PALONOSETRON HCL INJECTION 0.25 MG/5ML
0.2500 mg | Freq: Once | INTRAVENOUS | Status: AC
  Administered 2020-07-24: 0.25 mg via INTRAVENOUS
  Filled 2020-07-24: qty 5

## 2020-07-24 MED ORDER — SODIUM CHLORIDE 0.9% FLUSH
10.0000 mL | INTRAVENOUS | Status: DC | PRN
  Filled 2020-07-24: qty 10

## 2020-07-24 MED ORDER — SODIUM CHLORIDE 0.9 % IV SOLN
400.0000 mg/m2 | Freq: Once | INTRAVENOUS | Status: AC
  Administered 2020-07-24: 716 mg via INTRAVENOUS
  Filled 2020-07-24: qty 35.8

## 2020-07-24 MED ORDER — PANTOPRAZOLE SODIUM 40 MG PO TBEC: 40.0000 mg | DELAYED_RELEASE_TABLET | Freq: Every day | ORAL | 5 refills | Status: DC

## 2020-07-24 MED ORDER — SODIUM CHLORIDE 0.9 % IV SOLN
10.0000 mg | Freq: Once | INTRAVENOUS | Status: AC
  Administered 2020-07-24: 10 mg via INTRAVENOUS
  Filled 2020-07-24: qty 1

## 2020-07-24 MED ORDER — SODIUM CHLORIDE 0.9 % IV SOLN
Freq: Once | INTRAVENOUS | Status: DC
  Filled 2020-07-24: qty 250

## 2020-07-24 MED ORDER — FLUOROURACIL CHEMO INJECTION 2.5 GM/50ML
400.0000 mg/m2 | Freq: Once | INTRAVENOUS | Status: AC
  Administered 2020-07-24: 700 mg via INTRAVENOUS
  Filled 2020-07-24: qty 14

## 2020-07-24 MED ORDER — ATROPINE SULFATE 1 MG/ML IJ SOLN
0.5000 mg | Freq: Once | INTRAMUSCULAR | Status: AC | PRN
  Administered 2020-07-24: 0.5 mg via INTRAVENOUS
  Filled 2020-07-24: qty 1

## 2020-07-24 MED ORDER — HEPARIN SOD (PORK) LOCK FLUSH 100 UNIT/ML IV SOLN
500.0000 [IU] | Freq: Once | INTRAVENOUS | Status: DC | PRN
  Filled 2020-07-24: qty 5

## 2020-07-24 MED ORDER — RIVAROXABAN 20 MG PO TABS: 20.0000 mg | ORAL_TABLET | Freq: Every day | ORAL | 5 refills | Status: AC

## 2020-07-24 NOTE — Progress Notes (Signed)
[place] [facility] OFFICE PROGRESS NOTE   Diagnosis: Colon cancer  INTERVAL HISTORY:   [name] returns as scheduled.  He completed another cycle of FOLFIRI 07/10/2020.  He denies nausea/vomiting.  No mouth sores.  No diarrhea.  He experienced hypersalivation for a short time following chemotherapy.  Objective:  Vital signs in last 24 hours:  Blood pressure 124/83, pulse 84, temperature 97.7 F (36.5 C), temperature source Oral, resp. rate 18, height '5\' 10"'  (1.778 m), weight 150 lb 3.2 oz (68.1 kg), SpO2 100 %.    HEENT: No thrush or ulcers. Resp: Lungs clear bilaterally. Cardio: Regular rate and rhythm. GI: Abdomen soft and nontender.  No hepatomegaly. Vascular: No leg edema. Skin: Palms with skin thickening, hyperpigmentation. Port-A-Cath without erythema.  Lab Results:  Lab Results  Component Value Date   WBC 5.4 07/24/2020   HGB 11.1 (L) 07/24/2020   HCT 37.0 (L) 07/24/2020   MCV 81.5 07/24/2020   PLT 184 07/24/2020   NEUTROABS 3.2 07/24/2020    Imaging:  No results found.  Medications: I have reviewed the patient's current medications.  Assessment/Plan: Moderately differentiated adenocarcinoma ascending colon, stage IIIb (pT3pN1), status post a right colectomy 11/19/2018 Lymphovascular and perineural invasion present, 2/14 lymph nodes positive, tumor deposits present Positive radial margin, no loss of mismatch repair protein expression, discussed case with Dr. Craig Staggers mass with surrounding inflammation at multiple margins, no gross residual disease, unclear which is the "positive "radial margin, further surgery and radiation not recommended Colonoscopy 11/19/2018-completely obstructing mid ascending colon mass, could not be passed with endoscope, biopsy confirmed invasive adenocarcinoma CT abdomen/pelvis 11/18/2018-wall thickening at the mid and distal ascending colon with mild distention of the proximal ascending colon and cecum CTs  10/17/2018--no acute findings, no chest lymphadenopathy, lungs clear Cycle 1 FOLFOX 12/28/2018 Cycle 2 FOLFOX 01/11/2019, Udenyca added  Cycle 3 FOLFOX 01/25/2019, Udenyca held due to bone pain Cycle 4 FOLFOX 02/21/2019, Udenyca added Cycle 5 FOLFOX 03/09/2019, Udenyca Cycle 6 FOLFOX 03/21/2019, Udenyca Cycle 7 FOLFOX 04/04/2019, Udenyca Cycle 8 FOLFOX 04/18/2019, Udenyca  Cycle 9 FOLFOX 05/02/2019, Udenyca Cycle 10 FOLFOX 05/16/2019, oxaliplatin, 5-FU bolus, and Udenyca held Cycle 11 FOLFOX 05/30/2019, oxaliplatin, 5-FU bolus, and Udenyca held Cycle 12 FOLFOX 06/15/2019, oxaliplatin, 5-FU bolus and Udenyca held CTs 09/29/2019-new hypodense enhancing liver masses consistent with metastatic disease, indistinct marginated nodularity below the pancreas head-likely small lymph nodes Biopsy liver lesion 10/18/2019-adenocarcinoma consistent with history of colorectal carcinoma Cycle 1 FOLFIRI/Avastin 11/01/2019 Cycle 2 FOLFIRI/Avastin 11/15/2019 Cycle 3 FOLFIRI/Avastin 11/29/2019 Cycle 4 FOLFIRI/Avastin 12/13/2019 CT chest 12/27/2019-no significant change in hepatic metastases, submassive pulmonary emboli Cycle 5 FOLFIRI 01/10/2020 (Avastin discontinued) Cycle 6 FOLFIRI 02/01/2020 Cycle 7 FOLFIRI 02/13/2020 Cycle 8 FOLFIRI 02/27/2020 CTs abdomen/pelvis 03/09/2020-per Alexander Reeves review overall stable disease Cycle 9 FOLFIRI 04/16/2020 Cycle 10 FOLFIRI 05/01/2020 Cycle 11 FOLFIRI 05/15/2020 Cycle 12 FOLFIRI 05/29/2020 CT abdomen/pelvis 06/07/2020- decreased size of liver lesions, no new lesions Cycle 13 FOLFIRI 06/12/2020 Cycle 14 FOLFIRI 07/10/2020 Cycle 15 FOLFIRI 07/24/2020   Deaf Right epididymal cyst removal 03/22/2018 Asthma Port-A-Cath placement, Alexander Reeves, 12/23/2018 Neutropenia secondary to chemotherapy-Udenyca added for cycle 2 FOLFOX Admission with febrile neutropenia 02/08/2019 Hospital admission with submassive PE and left lower extremity DVT on 12/27/2019, heparin, transition to Xarelto  12/28/2019 CT chest 12/27/2019-submassive pulmonary emboli with evidence of right heart strain, no significant change in hepatic metastases      Disposition: Alexander Reeves appears well.  He has completed 14 cycles of FOLFIRI.  Overall he seems to be tolerating chemotherapy well.  Plan to proceed with cycle 15 today as scheduled.  Restaging CTs late August.  We reviewed the CBC from today.  Counts adequate to proceed with treatment.  He will return for lab, follow-up, FOLFIRI in 2 weeks.  He will contact the office in the interim with any problems.    Ned Card ANP/GNP-BC   07/24/2020  10:08 AM

## 2020-07-24 NOTE — Patient Instructions (Signed)
[facility]  Discharge Instructions: Thank you for choosing [facility] to provide your oncology and hematology care.   If you have a lab appointment with the [facility], please go directly to the [facility] and check in at the registration area.   Wear comfortable clothing and clothing appropriate for easy access to any Portacath or PICC line.   We strive to give you quality time with your provider. You may need to reschedule your appointment if you arrive late (15 or more minutes).  Arriving late affects you and other patients whose appointments are after yours.  Also, if you miss three or more appointments without notifying the office, you may be dismissed from the clinic at the provider's discretion.      For prescription refill requests, have your pharmacy contact our office and allow 72 hours for refills to be completed.    Today you received the following chemotherapy and/or immunotherapy agents: Irinotecan, Leucovorin, 5FU  To help prevent nausea and vomiting after your treatment, we encourage you to take your nausea medication as directed.  BELOW ARE SYMPTOMS THAT SHOULD BE REPORTED IMMEDIATELY: *FEVER GREATER THAN 100.4 F (38 C) OR HIGHER *CHILLS OR SWEATING *NAUSEA AND VOMITING THAT IS NOT CONTROLLED WITH YOUR NAUSEA MEDICATION *UNUSUAL SHORTNESS OF BREATH *UNUSUAL BRUISING OR BLEEDING *URINARY PROBLEMS (pain or burning when urinating, or frequent urination) *BOWEL PROBLEMS (unusual diarrhea, constipation, pain near the anus) TENDERNESS IN MOUTH AND THROAT WITH OR WITHOUT PRESENCE OF ULCERS (sore throat, sores in mouth, or a toothache) UNUSUAL RASH, SWELLING OR PAIN  UNUSUAL VAGINAL DISCHARGE OR ITCHING   Items with * indicate a potential emergency and should be followed up as soon as possible or go to the Emergency Department if any problems should occur.  Please show the CHEMOTHERAPY ALERT CARD or IMMUNOTHERAPY ALERT CARD at  check-in to the Emergency Department and triage nurse.  Should you have questions after your visit or need to cancel or reschedule your appointment, please contact [facility]  Dept: [phone]  and follow the prompts.  Office hours are 8:00 a.m. to 4:30 p.m. Monday - Friday. Please note that voicemails left after 4:00 p.m. may not be returned until the following business day.  We are closed weekends and major holidays. You have access to a nurse at all times for urgent questions. Please call the main number to the clinic Dept: [phone] and follow the prompts.   For any non-urgent questions, you may also contact your provider using MyChart. We now offer e-Visits for anyone 80 and older to request care online for non-urgent symptoms. For details visit [email or web address].   Also download the MyChart app! Go to the app store, search "MyChart", open the app, select [facility], and log in with your MyChart username and password.  Due to Covid, a mask is required upon entering the hospital/clinic. If you do not have a mask, one will be given to you upon arrival. For doctor visits, patients may have 1 support person aged 80 or older with them. For treatment visits, patients cannot have anyone with them due to current Covid guidelines and our immunocompromised population.   Irinotecan injection What is this medicine? IRINOTECAN (ir in oh TEE kan ) is a chemotherapy drug. It is used to treat colon and rectal cancer. This medicine may be used for other purposes; ask your health care provider or pharmacist if you have questions. COMMON BRAND NAME(S): Camptosar What should  I tell my health care provider before I take this medicine? They need to know if you have any of these conditions: dehydration diarrhea infection (especially a virus infection such as chickenpox, cold sores, or herpes) liver disease low blood counts, like low white cell, platelet, or red cell  counts low levels of calcium, magnesium, or potassium in the blood recent or ongoing radiation therapy an unusual or allergic reaction to irinotecan, other medicines, foods, dyes, or preservatives pregnant or trying to get pregnant breast-feeding How should I use this medicine? This drug is given as an infusion into a vein. It is administered in a hospital or clinic by a specially trained health care professional. Talk to your pediatrician regarding the use of this medicine in children. Special care may be needed. Overdosage: If you think you have taken too much of this medicine contact a poison control center or emergency room at once. NOTE: This medicine is only for you. Do not share this medicine with others. What if I miss a dose? It is important not to miss your dose. Call your doctor or health care professional if you are unable to keep an appointment. What may interact with this medicine? Do not take this medicine with any of the following medications: cobicistat itraconazole This medicine may interact with the following medications: antiviral medicines for HIV or AIDS certain antibiotics like rifampin or rifabutin certain medicines for fungal infections like ketoconazole, posaconazole, and voriconazole certain medicines for seizures like carbamazepine, phenobarbital, phenotoin clarithromycin gemfibrozil nefazodone [facility]. John's Wort This list may not describe all possible interactions. Give your health care provider a list of all the medicines, herbs, non-prescription drugs, or dietary supplements you use. Also tell them if you smoke, drink alcohol, or use illegal drugs. Some items may interact with your medicine. What should I watch for while using this medicine? Your condition will be monitored carefully while you are receiving this medicine. You will need important blood work done while you are taking this medicine. This drug may make you feel generally unwell. This is not  uncommon, as chemotherapy can affect healthy cells as well as cancer cells. Report any side effects. Continue your course of treatment even though you feel ill unless your doctor tells you to stop. In some cases, you may be given additional medicines to help with side effects. Follow all directions for their use. You may get drowsy or dizzy. Do not drive, use machinery, or do anything that needs mental alertness until you know how this medicine affects you. Do not stand or sit up quickly, especially if you are an older patient. This reduces the risk of dizzy or fainting spells. Call your health care professional for advice if you get a fever, chills, or sore throat, or other symptoms of a cold or flu. Do not treat yourself. This medicine decreases your body's ability to fight infections. Try to avoid being around people who are sick. Avoid taking products that contain aspirin, acetaminophen, ibuprofen, naproxen, or ketoprofen unless instructed by your doctor. These medicines may hide a fever. This medicine may increase your risk to bruise or bleed. Call your doctor or health care professional if you notice any unusual bleeding. Be careful brushing and flossing your teeth or using a toothpick because you may get an infection or bleed more easily. If you have any dental work done, tell your dentist you are receiving this medicine. Do not become pregnant while taking this medicine or for 6 months after stopping it. Women should  inform their health care professional if they wish to become pregnant or think they might be pregnant. Men should not father a child while taking this medicine and for 3 months after stopping it. There is potential for serious side effects to an unborn child. Talk to your health care professional for more information. Do not breast-feed an infant while taking this medicine or for 7 days after stopping it. This medicine has caused ovarian failure in some women. This medicine may make it  more difficult to get pregnant. Talk to your health care professional if you are concerned about your fertility. This medicine has caused decreased sperm counts in some men. This may make it more difficult to father a child. Talk to your health care professional if you are concerned about your fertility. What side effects may I notice from receiving this medicine? Side effects that you should report to your doctor or health care professional as soon as possible: allergic reactions like skin rash, itching or hives, swelling of the face, lips, or tongue chest pain diarrhea flushing, runny nose, sweating during infusion low blood counts - this medicine may decrease the number of white blood cells, red blood cells and platelets. You may be at increased risk for infections and bleeding. nausea, vomiting pain, swelling, warmth in the leg signs of decreased platelets or bleeding - bruising, pinpoint red spots on the skin, black, tarry stools, blood in the urine signs of infection - fever or chills, cough, sore throat, pain or difficulty passing urine signs of decreased red blood cells - unusually weak or tired, fainting spells, lightheadedness Side effects that usually do not require medical attention (report to your doctor or health care professional if they continue or are bothersome): constipation hair loss headache loss of appetite mouth sores stomach pain This list may not describe all possible side effects. Call your doctor for medical advice about side effects. You may report side effects to FDA at 1-800-FDA-1088. Where should I keep my medicine? This drug is given in a hospital or clinic and will not be stored at home. NOTE: This sheet is a summary. It may not cover all possible information. If you have questions about this medicine, talk to your doctor, pharmacist, or health care provider.  2021 Elsevier/Gold Standard (2018-11-30 17:46:13)  Leucovorin injection What is this  medicine? LEUCOVORIN (loo koe VOR in) is used to prevent or treat the harmful effects of some medicines. This medicine is used to treat anemia caused by a low amount of folic acid in the body. It is also used with 5-fluorouracil (5-FU) to treat colon cancer. This medicine may be used for other purposes; ask your health care provider or pharmacist if you have questions. What should I tell my health care provider before I take this medicine? They need to know if you have any of these conditions: anemia from low levels of vitamin B-12 in the blood an unusual or allergic reaction to leucovorin, folic acid, other medicines, foods, dyes, or preservatives pregnant or trying to get pregnant breast-feeding How should I use this medicine? This medicine is for injection into a muscle or into a vein. It is given by a health care professional in a hospital or clinic setting. Talk to your pediatrician regarding the use of this medicine in children. Special care may be needed. Overdosage: If you think you have taken too much of this medicine contact a poison control center or emergency room at once. NOTE: This medicine is only for you. Do  not share this medicine with others. What if I miss a dose? This does not apply. What may interact with this medicine? capecitabine fluorouracil phenobarbital phenytoin primidone trimethoprim-sulfamethoxazole This list may not describe all possible interactions. Give your health care provider a list of all the medicines, herbs, non-prescription drugs, or dietary supplements you use. Also tell them if you smoke, drink alcohol, or use illegal drugs. Some items may interact with your medicine. What should I watch for while using this medicine? Your condition will be monitored carefully while you are receiving this medicine. This medicine may increase the side effects of 5-fluorouracil, 5-FU. Tell your doctor or health care professional if you have diarrhea or mouth sores  that do not get better or that get worse. What side effects may I notice from receiving this medicine? Side effects that you should report to your doctor or health care professional as soon as possible: allergic reactions like skin rash, itching or hives, swelling of the face, lips, or tongue breathing problems fever, infection mouth sores unusual bleeding or bruising unusually weak or tired Side effects that usually do not require medical attention (report to your doctor or health care professional if they continue or are bothersome): constipation or diarrhea loss of appetite nausea, vomiting This list may not describe all possible side effects. Call your doctor for medical advice about side effects. You may report side effects to FDA at 1-800-FDA-1088. Where should I keep my medicine? This drug is given in a hospital or clinic and will not be stored at home. NOTE: This sheet is a summary. It may not cover all possible information. If you have questions about this medicine, talk to your doctor, pharmacist, or health care provider.  2021 Elsevier/Gold Standard (2007-07-06 16:50:29)  Fluorouracil, 5-FU injection What is this medicine? FLUOROURACIL, 5-FU (flure oh YOOR a sil) is a chemotherapy drug. It slows the growth of cancer cells. This medicine is used to treat many types of cancer like breast cancer, colon or rectal cancer, pancreatic cancer, and stomach cancer. This medicine may be used for other purposes; ask your health care provider or pharmacist if you have questions. COMMON BRAND NAME(S): Adrucil What should I tell my health care provider before I take this medicine? They need to know if you have any of these conditions: blood disorders dihydropyrimidine dehydrogenase (DPD) deficiency infection (especially a virus infection such as chickenpox, cold sores, or herpes) kidney disease liver disease malnourished, poor nutrition recent or ongoing radiation therapy an unusual or  allergic reaction to fluorouracil, other chemotherapy, other medicines, foods, dyes, or preservatives pregnant or trying to get pregnant breast-feeding How should I use this medicine? This drug is given as an infusion or injection into a vein. It is administered in a hospital or clinic by a specially trained health care professional. Talk to your pediatrician regarding the use of this medicine in children. Special care may be needed. Overdosage: If you think you have taken too much of this medicine contact a poison control center or emergency room at once. NOTE: This medicine is only for you. Do not share this medicine with others. What if I miss a dose? It is important not to miss your dose. Call your doctor or health care professional if you are unable to keep an appointment. What may interact with this medicine? Do not take this medicine with any of the following medications: live virus vaccines This medicine may also interact with the following medications: medicines that treat or prevent blood clots like  warfarin, enoxaparin, and dalteparin This list may not describe all possible interactions. Give your health care provider a list of all the medicines, herbs, non-prescription drugs, or dietary supplements you use. Also tell them if you smoke, drink alcohol, or use illegal drugs. Some items may interact with your medicine. What should I watch for while using this medicine? Visit your doctor for checks on your progress. This drug may make you feel generally unwell. This is not uncommon, as chemotherapy can affect healthy cells as well as cancer cells. Report any side effects. Continue your course of treatment even though you feel ill unless your doctor tells you to stop. In some cases, you may be given additional medicines to help with side effects. Follow all directions for their use. Call your doctor or health care professional for advice if you get a fever, chills or sore throat, or other  symptoms of a cold or flu. Do not treat yourself. This drug decreases your body's ability to fight infections. Try to avoid being around people who are sick. This medicine may increase your risk to bruise or bleed. Call your doctor or health care professional if you notice any unusual bleeding. Be careful brushing and flossing your teeth or using a toothpick because you may get an infection or bleed more easily. If you have any dental work done, tell your dentist you are receiving this medicine. Avoid taking products that contain aspirin, acetaminophen, ibuprofen, naproxen, or ketoprofen unless instructed by your doctor. These medicines may hide a fever. Do not become pregnant while taking this medicine. Women should inform their doctor if they wish to become pregnant or think they might be pregnant. There is a potential for serious side effects to an unborn child. Talk to your health care professional or pharmacist for more information. Do not breast-feed an infant while taking this medicine. Men should inform their doctor if they wish to father a child. This medicine may lower sperm counts. Do not treat diarrhea with over the counter products. Contact your doctor if you have diarrhea that lasts more than 2 days or if it is severe and watery. This medicine can make you more sensitive to the sun. Keep out of the sun. If you cannot avoid being in the sun, wear protective clothing and use sunscreen. Do not use sun lamps or tanning beds/booths. What side effects may I notice from receiving this medicine? Side effects that you should report to your doctor or health care professional as soon as possible: allergic reactions like skin rash, itching or hives, swelling of the face, lips, or tongue low blood counts - this medicine may decrease the number of white blood cells, red blood cells and platelets. You may be at increased risk for infections and bleeding. signs of infection - fever or chills, cough, sore  throat, pain or difficulty passing urine signs of decreased platelets or bleeding - bruising, pinpoint red spots on the skin, black, tarry stools, blood in the urine signs of decreased red blood cells - unusually weak or tired, fainting spells, lightheadedness breathing problems changes in vision chest pain mouth sores nausea and vomiting pain, swelling, redness at site where injected pain, tingling, numbness in the hands or feet redness, swelling, or sores on hands or feet stomach pain unusual bleeding Side effects that usually do not require medical attention (report to your doctor or health care professional if they continue or are bothersome): changes in finger or toe nails diarrhea dry or itchy skin hair loss headache  loss of appetite sensitivity of eyes to the light stomach upset unusually teary eyes This list may not describe all possible side effects. Call your doctor for medical advice about side effects. You may report side effects to FDA at 1-800-FDA-1088. Where should I keep my medicine? This drug is given in a hospital or clinic and will not be stored at home. NOTE: This sheet is a summary. It may not cover all possible information. If you have questions about this medicine, talk to your doctor, pharmacist, or health care provider.  2021 Elsevier/Gold Standard (2018-11-30 15:00:03)

## 2020-07-26 ENCOUNTER — Inpatient Hospital Stay: Payer: Medicare Other

## 2020-07-26 ENCOUNTER — Other Ambulatory Visit: Payer: Self-pay

## 2020-07-26 VITALS — BP 103/78 | HR 79 | Temp 98.4°F | Resp 18

## 2020-07-26 DIAGNOSIS — C7989 Secondary malignant neoplasm of other specified sites: Secondary | ICD-10-CM | POA: Diagnosis not present

## 2020-07-26 DIAGNOSIS — C779 Secondary and unspecified malignant neoplasm of lymph node, unspecified: Secondary | ICD-10-CM | POA: Diagnosis not present

## 2020-07-26 DIAGNOSIS — C787 Secondary malignant neoplasm of liver and intrahepatic bile duct: Secondary | ICD-10-CM | POA: Diagnosis not present

## 2020-07-26 DIAGNOSIS — C189 Malignant neoplasm of colon, unspecified: Secondary | ICD-10-CM

## 2020-07-26 DIAGNOSIS — C182 Malignant neoplasm of ascending colon: Secondary | ICD-10-CM

## 2020-07-26 DIAGNOSIS — D701 Agranulocytosis secondary to cancer chemotherapy: Secondary | ICD-10-CM | POA: Diagnosis not present

## 2020-07-26 DIAGNOSIS — Z5111 Encounter for antineoplastic chemotherapy: Secondary | ICD-10-CM | POA: Diagnosis not present

## 2020-07-26 MED ORDER — SODIUM CHLORIDE 0.9% FLUSH
10.0000 mL | INTRAVENOUS | Status: DC | PRN
  Administered 2020-07-26: 10 mL
  Filled 2020-07-26: qty 10

## 2020-07-26 MED ORDER — PEGFILGRASTIM-CBQV 6 MG/0.6ML ~~LOC~~ SOSY
6.0000 mg | PREFILLED_SYRINGE | Freq: Once | SUBCUTANEOUS | Status: AC
  Administered 2020-07-26: 6 mg via SUBCUTANEOUS

## 2020-07-26 MED ORDER — HEPARIN SOD (PORK) LOCK FLUSH 100 UNIT/ML IV SOLN
500.0000 [IU] | Freq: Once | INTRAVENOUS | Status: AC | PRN
  Administered 2020-07-26: 500 [IU]
  Filled 2020-07-26: qty 5

## 2020-07-26 NOTE — Patient Instructions (Signed)

## 2020-08-01 ENCOUNTER — Other Ambulatory Visit (INDEPENDENT_AMBULATORY_CARE_PROVIDER_SITE_OTHER): Payer: Self-pay | Admitting: Primary Care

## 2020-08-01 DIAGNOSIS — Z760 Encounter for issue of repeat prescription: Secondary | ICD-10-CM

## 2020-08-01 DIAGNOSIS — C189 Malignant neoplasm of colon, unspecified: Secondary | ICD-10-CM

## 2020-08-01 NOTE — Telephone Encounter (Signed)
Patient requesting a 5 month supply of VENTOLIN HFA 108 (90 Base) MCG/ACT inhaler, gabapentin (NEURONTIN) 300 MG capsule, please send to     [facility] ([place]), [place] - [address] Phone:  [phone]  Fax:  [phone]     Pharmacy contacted Pharmacy and was advised to contact PCP office regarding additional refills.

## 2020-08-01 NOTE — Telephone Encounter (Signed)
   Notes to clinic: Patient requesting a 5 month supply of medications  Review for refills   Requested Prescriptions  Pending Prescriptions Disp Refills   gabapentin (NEURONTIN) 300 MG capsule 90 capsule 3    Sig: Take 1 capsule (300 mg total) by mouth at bedtime.      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   [facility] [name], NP   10 months ago Nonspeaking deaf   [facility] [name], NP   11 months ago Arthralgia, unspecified joint   [facility] [name], NP   1 year ago Annual physical exam   [facility] [name], NP   2 years ago Severe persistent asthma with acute exacerbation   [facility] Elsie Stain, MD                  VENTOLIN HFA 108 (90 Base) MCG/ACT inhaler 18 g 2    Sig: INHALE 2 PUFFS BY MOUTH EVERY 4 HOURS AS NEEDED FOR WHEEZING FOR SHORTNESS OF BREATH      Pulmonology:  Beta Agonists Failed - 08/01/2020  9:02 AM      Failed - One inhaler should last at least one month. If the patient is requesting refills earlier, contact the patient to check for uncontrolled symptoms.      Passed - Valid encounter within last 12 months    Recent Outpatient Visits           5 months ago Nonspeaking deaf   [facility] [name], NP   10 months ago Nonspeaking deaf   [place] [name], NP   11 months ago Arthralgia, unspecified joint   [facility] [name], NP   1 year ago Annual physical exam   [facility] [name], NP   2 years ago Severe persistent asthma with acute exacerbation   [facility]  Elsie Stain, MD

## 2020-08-04 ENCOUNTER — Other Ambulatory Visit: Payer: Self-pay | Admitting: Oncology

## 2020-08-07 ENCOUNTER — Encounter: Payer: Self-pay | Admitting: *Deleted

## 2020-08-07 ENCOUNTER — Inpatient Hospital Stay: Payer: Medicare Other

## 2020-08-07 ENCOUNTER — Other Ambulatory Visit: Payer: Self-pay

## 2020-08-07 ENCOUNTER — Inpatient Hospital Stay (HOSPITAL_BASED_OUTPATIENT_CLINIC_OR_DEPARTMENT_OTHER): Payer: Medicare Other | Admitting: Oncology

## 2020-08-07 VITALS — BP 118/81 | HR 83 | Temp 98.4°F | Resp 18 | Wt 146.2 lb

## 2020-08-07 DIAGNOSIS — C182 Malignant neoplasm of ascending colon: Secondary | ICD-10-CM

## 2020-08-07 DIAGNOSIS — D701 Agranulocytosis secondary to cancer chemotherapy: Secondary | ICD-10-CM | POA: Diagnosis not present

## 2020-08-07 DIAGNOSIS — C7989 Secondary malignant neoplasm of other specified sites: Secondary | ICD-10-CM | POA: Diagnosis not present

## 2020-08-07 DIAGNOSIS — C787 Secondary malignant neoplasm of liver and intrahepatic bile duct: Secondary | ICD-10-CM | POA: Diagnosis not present

## 2020-08-07 DIAGNOSIS — C189 Malignant neoplasm of colon, unspecified: Secondary | ICD-10-CM

## 2020-08-07 DIAGNOSIS — Z5111 Encounter for antineoplastic chemotherapy: Secondary | ICD-10-CM | POA: Diagnosis not present

## 2020-08-07 DIAGNOSIS — C779 Secondary and unspecified malignant neoplasm of lymph node, unspecified: Secondary | ICD-10-CM | POA: Diagnosis not present

## 2020-08-07 LAB — CMP (CANCER CENTER ONLY)
ALT: 8 U/L (ref 0–44)
AST: 12 U/L — ABNORMAL LOW (ref 15–41)
Albumin: 4.1 g/dL (ref 3.5–5.0)
Alkaline Phosphatase: 77 U/L (ref 38–126)
Anion gap: 7 (ref 5–15)
BUN: 15 mg/dL (ref 6–20)
CO2: 27 mmol/L (ref 22–32)
Calcium: 8.7 mg/dL — ABNORMAL LOW (ref 8.9–10.3)
Chloride: 105 mmol/L (ref 98–111)
Creatinine: 1.24 mg/dL (ref 0.61–1.24)
GFR, Estimated: >60 mL/min (ref >60)
Glucose, Bld: 92 mg/dL (ref 70–99)
Potassium: 4 mmol/L (ref 3.5–5.1)
Sodium: 139 mmol/L (ref 135–145)
Total Bilirubin: 0.3 mg/dL (ref 0.3–1.2)
Total Protein: 7 g/dL (ref 6.5–8.1)

## 2020-08-07 LAB — CBC WITH DIFFERENTIAL (CANCER CENTER ONLY)
Abs Immature Granulocytes: 0.23 10*3/uL — ABNORMAL HIGH (ref 0.00–0.07)
Basophils Absolute: 0.1 10*3/uL (ref 0.0–0.1)
Basophils Relative: 1 %
Eosinophils Absolute: 0.1 10*3/uL (ref 0.0–0.5)
Eosinophils Relative: 2 %
HCT: 38.3 % — ABNORMAL LOW (ref 39.0–52.0)
Hemoglobin: 11.6 g/dL — ABNORMAL LOW (ref 13.0–17.0)
Immature Granulocytes: 4 %
Lymphocytes Relative: 34 %
Lymphs Abs: 2 10*3/uL (ref 0.7–4.0)
MCH: 24.5 pg — ABNORMAL LOW (ref 26.0–34.0)
MCHC: 30.3 g/dL (ref 30.0–36.0)
MCV: 80.8 fL (ref 80.0–100.0)
Monocytes Absolute: 0.5 10*3/uL (ref 0.1–1.0)
Monocytes Relative: 8 %
Neutro Abs: 3.1 10*3/uL (ref 1.7–7.7)
Neutrophils Relative %: 51 %
Platelet Count: 204 10*3/uL (ref 150–400)
RBC: 4.74 MIL/uL (ref 4.22–5.81)
RDW: 19.7 % — ABNORMAL HIGH (ref 11.5–15.5)
WBC Count: 5.9 10*3/uL (ref 4.0–10.5)
nRBC: 0 % (ref 0.0–0.2)

## 2020-08-07 MED ORDER — PALONOSETRON HCL INJECTION 0.25 MG/5ML
0.2500 mg | Freq: Once | INTRAVENOUS | Status: AC
  Administered 2020-08-07: 0.25 mg via INTRAVENOUS
  Filled 2020-08-07: qty 5

## 2020-08-07 MED ORDER — SODIUM CHLORIDE 0.9 % IV SOLN
400.0000 mg/m2 | Freq: Once | INTRAVENOUS | Status: AC
  Administered 2020-08-07: 716 mg via INTRAVENOUS
  Filled 2020-08-07: qty 35.8

## 2020-08-07 MED ORDER — SODIUM CHLORIDE 0.9% FLUSH
10.0000 mL | INTRAVENOUS | Status: DC | PRN
  Filled 2020-08-07: qty 10

## 2020-08-07 MED ORDER — SODIUM CHLORIDE 0.9 % IV SOLN
2400.0000 mg/m2 | INTRAVENOUS | Status: DC
  Administered 2020-08-07: 4300 mg via INTRAVENOUS
  Filled 2020-08-07: qty 86

## 2020-08-07 MED ORDER — SODIUM CHLORIDE 0.9 % IV SOLN
Freq: Once | INTRAVENOUS | Status: AC
  Filled 2020-08-07: qty 250

## 2020-08-07 MED ORDER — FLUOROURACIL CHEMO INJECTION 2.5 GM/50ML
400.0000 mg/m2 | Freq: Once | INTRAVENOUS | Status: AC
  Administered 2020-08-07: 700 mg via INTRAVENOUS
  Filled 2020-08-07: qty 14

## 2020-08-07 MED ORDER — SODIUM CHLORIDE 0.9 % IV SOLN
10.0000 mg | Freq: Once | INTRAVENOUS | Status: AC
  Administered 2020-08-07: 10 mg via INTRAVENOUS
  Filled 2020-08-07: qty 1

## 2020-08-07 MED ORDER — ATROPINE SULFATE 1 MG/ML IJ SOLN
0.5000 mg | Freq: Once | INTRAMUSCULAR | Status: AC | PRN
  Administered 2020-08-07: 0.5 mg via INTRAVENOUS
  Filled 2020-08-07: qty 1

## 2020-08-07 MED ORDER — SODIUM CHLORIDE 0.9 % IV SOLN
180.0000 mg/m2 | Freq: Once | INTRAVENOUS | Status: AC
  Administered 2020-08-07: 320 mg via INTRAVENOUS
  Filled 2020-08-07: qty 15

## 2020-08-07 NOTE — Progress Notes (Signed)
[facility] OFFICE PROGRESS NOTE   Diagnosis: Colon cancer  INTERVAL HISTORY:   Alexander Reeves completed another cycle of FOLFIRI on 07/24/2020.  No nausea or diarrhea.  He had soreness at the abdominal G-CSF injection site.  No bone pain.  He reports his appetite has been poor, but is improving.  Objective:  Vital signs in last 24 hours:  Blood pressure 118/81, pulse 83, temperature 98.4 F (36.9 C), temperature source Oral, resp. rate 18, weight 146 lb 3.2 oz (66.3 kg), SpO2 99 %.    HEENT: Hyperpigmentation, no thrush or ulcers Resp: Lungs clear bilaterally Cardio: Regular rate and rhythm GI: No hepatosplenomegaly, nontender Vascular: No leg edema  Skin: Hyperpigmentation, dryness, and skin thickening of the palms  Portacath/PICC-without erythema  Lab Results:  Lab Results  Component Value Date   WBC 5.9 08/07/2020   HGB 11.6 (L) 08/07/2020   HCT 38.3 (L) 08/07/2020   MCV 80.8 08/07/2020   PLT 204 08/07/2020   NEUTROABS 3.1 08/07/2020    CMP  Lab Results  Component Value Date   NA 139 08/07/2020   K 4.0 08/07/2020   CL 105 08/07/2020   CO2 27 08/07/2020   GLUCOSE 92 08/07/2020   BUN 15 08/07/2020   CREATININE 1.24 08/07/2020   CALCIUM 8.7 (L) 08/07/2020   PROT 7.0 08/07/2020   ALBUMIN 4.1 08/07/2020   AST 12 (L) 08/07/2020   ALT 8 08/07/2020   ALKPHOS 77 08/07/2020   BILITOT 0.3 08/07/2020   GFRNONAA >60 08/07/2020   GFRAA >60 09/28/2019    Lab Results  Component Value Date   CEA1 1.75 09/28/2019    Medications: I have reviewed the patient's current medications.   Assessment/Plan: Moderately differentiated adenocarcinoma ascending colon, stage IIIb (pT3pN1), status post a right colectomy 11/19/2018 Lymphovascular and perineural invasion present, 2/14 lymph nodes positive, tumor deposits present Positive radial margin, no loss of mismatch repair protein expression, discussed case with Dr. Craig Staggers mass with surrounding  inflammation at multiple margins, no gross residual disease, unclear which is the "positive "radial margin, further surgery and radiation not recommended Colonoscopy 11/19/2018-completely obstructing mid ascending colon mass, could not be passed with endoscope, biopsy confirmed invasive adenocarcinoma CT abdomen/pelvis 11/18/2018-wall thickening at the mid and distal ascending colon with mild distention of the proximal ascending colon and cecum CTs 10/17/2018--no acute findings, no chest lymphadenopathy, lungs clear Cycle 1 FOLFOX 12/28/2018 Cycle 2 FOLFOX 01/11/2019, Udenyca added  Cycle 3 FOLFOX 01/25/2019, Udenyca held due to bone pain Cycle 4 FOLFOX 02/21/2019, Udenyca added Cycle 5 FOLFOX 03/09/2019, Udenyca Cycle 6 FOLFOX 03/21/2019, Udenyca Cycle 7 FOLFOX 04/04/2019, Udenyca Cycle 8 FOLFOX 04/18/2019, Udenyca  Cycle 9 FOLFOX 05/02/2019, Udenyca Cycle 10 FOLFOX 05/16/2019, oxaliplatin, 5-FU bolus, and Udenyca held Cycle 11 FOLFOX 05/30/2019, oxaliplatin, 5-FU bolus, and Udenyca held Cycle 12 FOLFOX 06/15/2019, oxaliplatin, 5-FU bolus and Udenyca held CTs 09/29/2019-new hypodense enhancing liver masses consistent with metastatic disease, indistinct marginated nodularity below the pancreas head-likely small lymph nodes Biopsy liver lesion 10/18/2019-adenocarcinoma consistent with history of colorectal carcinoma Cycle 1 FOLFIRI/Avastin 11/01/2019 Cycle 2 FOLFIRI/Avastin 11/15/2019 Cycle 3 FOLFIRI/Avastin 11/29/2019 Cycle 4 FOLFIRI/Avastin 12/13/2019 CT chest 12/27/2019-no significant change in hepatic metastases, submassive pulmonary emboli Cycle 5 FOLFIRI 01/10/2020 (Avastin discontinued) Cycle 6 FOLFIRI 02/01/2020 Cycle 7 FOLFIRI 02/13/2020 Cycle 8 FOLFIRI 02/27/2020 CTs abdomen/pelvis 03/09/2020-per Dr. Gearldine Shown review overall stable disease Cycle 9 FOLFIRI 04/16/2020 Cycle 10 FOLFIRI 05/01/2020 Cycle 11 FOLFIRI 05/15/2020 Cycle 12 FOLFIRI 05/29/2020 CT abdomen/pelvis 06/07/2020- decreased size of liver  lesions, no  new lesions Cycle 13 FOLFIRI 06/12/2020 Cycle 14 FOLFIRI 07/10/2020 Cycle 15 FOLFIRI 07/24/2020 Cycle 16 FOLFIRI 08/07/2020   Deaf Right epididymal cyst removal 03/22/2018 Asthma Port-A-Cath placement, [name], 12/23/2018 Neutropenia secondary to chemotherapy-Udenyca added for cycle 2 FOLFOX Admission with febrile neutropenia 02/08/2019 Hospital admission with submassive PE and left lower extremity DVT on 12/27/2019, heparin, transition to Xarelto 12/28/2019 CT chest 12/27/2019-submassive pulmonary emboli with evidence of right heart strain, no significant change in hepatic metastases       Disposition: [name] appears stable.  He continues to tolerate the FOLFIRI well.  There is no clinical evidence of disease progression.  He will complete another cycle of FOLFIRI today.  The plan is to schedule a restaging CT evaluation in late August or early September.  Betsy Coder, MD  08/07/2020  10:05 AM

## 2020-08-07 NOTE — Patient Instructions (Signed)
[facility]  Discharge Instructions: Thank you for choosing [facility] to provide your oncology and hematology care.   If you have a lab appointment with the [facility], please go directly to the [facility] and check in at the registration area.   Wear comfortable clothing and clothing appropriate for easy access to any Portacath or PICC line.   We strive to give you quality time with your provider. You may need to reschedule your appointment if you arrive late (15 or more minutes).  Arriving late affects you and other patients whose appointments are after yours.  Also, if you miss three or more appointments without notifying the office, you may be dismissed from the clinic at the provider's discretion.      For prescription refill requests, have your pharmacy contact our office and allow 72 hours for refills to be completed.    Today you received the following chemotherapy and/or immunotherapy agents: Irinotecan, Leucovorin, 5FU  To help prevent nausea and vomiting after your treatment, we encourage you to take your nausea medication as directed.  BELOW ARE SYMPTOMS THAT SHOULD BE REPORTED IMMEDIATELY: *FEVER GREATER THAN 100.4 F (38 C) OR HIGHER *CHILLS OR SWEATING *NAUSEA AND VOMITING THAT IS NOT CONTROLLED WITH YOUR NAUSEA MEDICATION *UNUSUAL SHORTNESS OF BREATH *UNUSUAL BRUISING OR BLEEDING *URINARY PROBLEMS (pain or burning when urinating, or frequent urination) *BOWEL PROBLEMS (unusual diarrhea, constipation, pain near the anus) TENDERNESS IN MOUTH AND THROAT WITH OR WITHOUT PRESENCE OF ULCERS (sore throat, sores in mouth, or a toothache) UNUSUAL RASH, SWELLING OR PAIN  UNUSUAL VAGINAL DISCHARGE OR ITCHING   Items with * indicate a potential emergency and should be followed up as soon as possible or go to the Emergency Department if any problems should occur.  Please show the CHEMOTHERAPY ALERT CARD or IMMUNOTHERAPY ALERT CARD at  check-in to the Emergency Department and triage nurse.  Should you have questions after your visit or need to cancel or reschedule your appointment, please contact [facility]  Dept: [phone]  and follow the prompts.  Office hours are 8:00 a.m. to 4:30 p.m. Monday - Friday. Please note that voicemails left after 4:00 p.m. may not be returned until the following business day.  We are closed weekends and major holidays. You have access to a nurse at all times for urgent questions. Please call the main number to the clinic Dept: [phone] and follow the prompts.   For any non-urgent questions, you may also contact your provider using MyChart. We now offer e-Visits for anyone 65 and older to request care online for non-urgent symptoms. For details visit [email or web address].   Also download the MyChart app! Go to the app store, search "MyChart", open the app, select [facility], and log in with your MyChart username and password.  Due to Covid, a mask is required upon entering the hospital/clinic. If you do not have a mask, one will be given to you upon arrival. For doctor visits, patients may have 1 support person aged 86 or older with them. For treatment visits, patients cannot have anyone with them due to current Covid guidelines and our immunocompromised population.   Irinotecan injection What is this medicine? IRINOTECAN (ir in oh TEE kan ) is a chemotherapy drug. It is used to treat colon and rectal cancer. This medicine may be used for other purposes; ask your health care provider or pharmacist if you have questions. COMMON BRAND NAME(S): Camptosar What should  I tell my health care provider before I take this medicine? They need to know if you have any of these conditions: dehydration diarrhea infection (especially a virus infection such as chickenpox, cold sores, or herpes) liver disease low blood counts, like low white cell, platelet, or red cell  counts low levels of calcium, magnesium, or potassium in the blood recent or ongoing radiation therapy an unusual or allergic reaction to irinotecan, other medicines, foods, dyes, or preservatives pregnant or trying to get pregnant breast-feeding How should I use this medicine? This drug is given as an infusion into a vein. It is administered in a hospital or clinic by a specially trained health care professional. Talk to your pediatrician regarding the use of this medicine in children. Special care may be needed. Overdosage: If you think you have taken too much of this medicine contact a poison control center or emergency room at once. NOTE: This medicine is only for you. Do not share this medicine with others. What if I miss a dose? It is important not to miss your dose. Call your doctor or health care professional if you are unable to keep an appointment. What may interact with this medicine? Do not take this medicine with any of the following medications: cobicistat itraconazole This medicine may interact with the following medications: antiviral medicines for HIV or AIDS certain antibiotics like rifampin or rifabutin certain medicines for fungal infections like ketoconazole, posaconazole, and voriconazole certain medicines for seizures like carbamazepine, phenobarbital, phenotoin clarithromycin gemfibrozil nefazodone St. John's Wort This list may not describe all possible interactions. Give your health care provider a list of all the medicines, herbs, non-prescription drugs, or dietary supplements you use. Also tell them if you smoke, drink alcohol, or use illegal drugs. Some items may interact with your medicine. What should I watch for while using this medicine? Your condition will be monitored carefully while you are receiving this medicine. You will need important blood work done while you are taking this medicine. This drug may make you feel generally unwell. This is not  uncommon, as chemotherapy can affect healthy cells as well as cancer cells. Report any side effects. Continue your course of treatment even though you feel ill unless your doctor tells you to stop. In some cases, you may be given additional medicines to help with side effects. Follow all directions for their use. You may get drowsy or dizzy. Do not drive, use machinery, or do anything that needs mental alertness until you know how this medicine affects you. Do not stand or sit up quickly, especially if you are an older patient. This reduces the risk of dizzy or fainting spells. Call your health care professional for advice if you get a fever, chills, or sore throat, or other symptoms of a cold or flu. Do not treat yourself. This medicine decreases your body's ability to fight infections. Try to avoid being around people who are sick. Avoid taking products that contain aspirin, acetaminophen, ibuprofen, naproxen, or ketoprofen unless instructed by your doctor. These medicines may hide a fever. This medicine may increase your risk to bruise or bleed. Call your doctor or health care professional if you notice any unusual bleeding. Be careful brushing and flossing your teeth or using a toothpick because you may get an infection or bleed more easily. If you have any dental work done, tell your dentist you are receiving this medicine. Do not become pregnant while taking this medicine or for 6 months after stopping it. Women should  inform their health care professional if they wish to become pregnant or think they might be pregnant. Men should not father a child while taking this medicine and for 3 months after stopping it. There is potential for serious side effects to an unborn child. Talk to your health care professional for more information. Do not breast-feed an infant while taking this medicine or for 7 days after stopping it. This medicine has caused ovarian failure in some women. This medicine may make it  more difficult to get pregnant. Talk to your health care professional if you are concerned about your fertility. This medicine has caused decreased sperm counts in some men. This may make it more difficult to father a child. Talk to your health care professional if you are concerned about your fertility. What side effects may I notice from receiving this medicine? Side effects that you should report to your doctor or health care professional as soon as possible: allergic reactions like skin rash, itching or hives, swelling of the face, lips, or tongue chest pain diarrhea flushing, runny nose, sweating during infusion low blood counts - this medicine may decrease the number of white blood cells, red blood cells and platelets. You may be at increased risk for infections and bleeding. nausea, vomiting pain, swelling, warmth in the leg signs of decreased platelets or bleeding - bruising, pinpoint red spots on the skin, black, tarry stools, blood in the urine signs of infection - fever or chills, cough, sore throat, pain or difficulty passing urine signs of decreased red blood cells - unusually weak or tired, fainting spells, lightheadedness Side effects that usually do not require medical attention (report to your doctor or health care professional if they continue or are bothersome): constipation hair loss headache loss of appetite mouth sores stomach pain This list may not describe all possible side effects. Call your doctor for medical advice about side effects. You may report side effects to FDA at 1-800-FDA-1088. Where should I keep my medicine? This drug is given in a hospital or clinic and will not be stored at home. NOTE: This sheet is a summary. It may not cover all possible information. If you have questions about this medicine, talk to your doctor, pharmacist, or health care provider.  2021 Elsevier/Gold Standard (2018-11-30 17:46:13)  Leucovorin injection What is this  medicine? LEUCOVORIN (loo koe VOR in) is used to prevent or treat the harmful effects of some medicines. This medicine is used to treat anemia caused by a low amount of folic acid in the body. It is also used with 5-fluorouracil (5-FU) to treat colon cancer. This medicine may be used for other purposes; ask your health care provider or pharmacist if you have questions. What should I tell my health care provider before I take this medicine? They need to know if you have any of these conditions: anemia from low levels of vitamin B-12 in the blood an unusual or allergic reaction to leucovorin, folic acid, other medicines, foods, dyes, or preservatives pregnant or trying to get pregnant breast-feeding How should I use this medicine? This medicine is for injection into a muscle or into a vein. It is given by a health care professional in a hospital or clinic setting. Talk to your pediatrician regarding the use of this medicine in children. Special care may be needed. Overdosage: If you think you have taken too much of this medicine contact a poison control center or emergency room at once. NOTE: This medicine is only for you. Do  not share this medicine with others. What if I miss a dose? This does not apply. What may interact with this medicine? capecitabine fluorouracil phenobarbital phenytoin primidone trimethoprim-sulfamethoxazole This list may not describe all possible interactions. Give your health care provider a list of all the medicines, herbs, non-prescription drugs, or dietary supplements you use. Also tell them if you smoke, drink alcohol, or use illegal drugs. Some items may interact with your medicine. What should I watch for while using this medicine? Your condition will be monitored carefully while you are receiving this medicine. This medicine may increase the side effects of 5-fluorouracil, 5-FU. Tell your doctor or health care professional if you have diarrhea or mouth sores  that do not get better or that get worse. What side effects may I notice from receiving this medicine? Side effects that you should report to your doctor or health care professional as soon as possible: allergic reactions like skin rash, itching or hives, swelling of the face, lips, or tongue breathing problems fever, infection mouth sores unusual bleeding or bruising unusually weak or tired Side effects that usually do not require medical attention (report to your doctor or health care professional if they continue or are bothersome): constipation or diarrhea loss of appetite nausea, vomiting This list may not describe all possible side effects. Call your doctor for medical advice about side effects. You may report side effects to FDA at 1-800-FDA-1088. Where should I keep my medicine? This drug is given in a hospital or clinic and will not be stored at home. NOTE: This sheet is a summary. It may not cover all possible information. If you have questions about this medicine, talk to your doctor, pharmacist, or health care provider.  2021 Elsevier/Gold Standard (2007-07-06 16:50:29)  Fluorouracil, 5-FU injection What is this medicine? FLUOROURACIL, 5-FU (flure oh YOOR a sil) is a chemotherapy drug. It slows the growth of cancer cells. This medicine is used to treat many types of cancer like breast cancer, colon or rectal cancer, pancreatic cancer, and stomach cancer. This medicine may be used for other purposes; ask your health care provider or pharmacist if you have questions. COMMON BRAND NAME(S): Adrucil What should I tell my health care provider before I take this medicine? They need to know if you have any of these conditions: blood disorders dihydropyrimidine dehydrogenase (DPD) deficiency infection (especially a virus infection such as chickenpox, cold sores, or herpes) kidney disease liver disease malnourished, poor nutrition recent or ongoing radiation therapy an unusual or  allergic reaction to fluorouracil, other chemotherapy, other medicines, foods, dyes, or preservatives pregnant or trying to get pregnant breast-feeding How should I use this medicine? This drug is given as an infusion or injection into a vein. It is administered in a hospital or clinic by a specially trained health care professional. Talk to your pediatrician regarding the use of this medicine in children. Special care may be needed. Overdosage: If you think you have taken too much of this medicine contact a poison control center or emergency room at once. NOTE: This medicine is only for you. Do not share this medicine with others. What if I miss a dose? It is important not to miss your dose. Call your doctor or health care professional if you are unable to keep an appointment. What may interact with this medicine? Do not take this medicine with any of the following medications: live virus vaccines This medicine may also interact with the following medications: medicines that treat or prevent blood clots like  warfarin, enoxaparin, and dalteparin This list may not describe all possible interactions. Give your health care provider a list of all the medicines, herbs, non-prescription drugs, or dietary supplements you use. Also tell them if you smoke, drink alcohol, or use illegal drugs. Some items may interact with your medicine. What should I watch for while using this medicine? Visit your doctor for checks on your progress. This drug may make you feel generally unwell. This is not uncommon, as chemotherapy can affect healthy cells as well as cancer cells. Report any side effects. Continue your course of treatment even though you feel ill unless your doctor tells you to stop. In some cases, you may be given additional medicines to help with side effects. Follow all directions for their use. Call your doctor or health care professional for advice if you get a fever, chills or sore throat, or other  symptoms of a cold or flu. Do not treat yourself. This drug decreases your body's ability to fight infections. Try to avoid being around people who are sick. This medicine may increase your risk to bruise or bleed. Call your doctor or health care professional if you notice any unusual bleeding. Be careful brushing and flossing your teeth or using a toothpick because you may get an infection or bleed more easily. If you have any dental work done, tell your dentist you are receiving this medicine. Avoid taking products that contain aspirin, acetaminophen, ibuprofen, naproxen, or ketoprofen unless instructed by your doctor. These medicines may hide a fever. Do not become pregnant while taking this medicine. Women should inform their doctor if they wish to become pregnant or think they might be pregnant. There is a potential for serious side effects to an unborn child. Talk to your health care professional or pharmacist for more information. Do not breast-feed an infant while taking this medicine. Men should inform their doctor if they wish to father a child. This medicine may lower sperm counts. Do not treat diarrhea with over the counter products. Contact your doctor if you have diarrhea that lasts more than 2 days or if it is severe and watery. This medicine can make you more sensitive to the sun. Keep out of the sun. If you cannot avoid being in the sun, wear protective clothing and use sunscreen. Do not use sun lamps or tanning beds/booths. What side effects may I notice from receiving this medicine? Side effects that you should report to your doctor or health care professional as soon as possible: allergic reactions like skin rash, itching or hives, swelling of the face, lips, or tongue low blood counts - this medicine may decrease the number of white blood cells, red blood cells and platelets. You may be at increased risk for infections and bleeding. signs of infection - fever or chills, cough, sore  throat, pain or difficulty passing urine signs of decreased platelets or bleeding - bruising, pinpoint red spots on the skin, black, tarry stools, blood in the urine signs of decreased red blood cells - unusually weak or tired, fainting spells, lightheadedness breathing problems changes in vision chest pain mouth sores nausea and vomiting pain, swelling, redness at site where injected pain, tingling, numbness in the hands or feet redness, swelling, or sores on hands or feet stomach pain unusual bleeding Side effects that usually do not require medical attention (report to your doctor or health care professional if they continue or are bothersome): changes in finger or toe nails diarrhea dry or itchy skin hair loss headache  loss of appetite sensitivity of eyes to the light stomach upset unusually teary eyes This list may not describe all possible side effects. Call your doctor for medical advice about side effects. You may report side effects to FDA at 1-800-FDA-1088. Where should I keep my medicine? This drug is given in a hospital or clinic and will not be stored at home. NOTE: This sheet is a summary. It may not cover all possible information. If you have questions about this medicine, talk to your doctor, pharmacist, or health care provider.  2021 Elsevier/Gold Standard (2018-11-30 15:00:03)

## 2020-08-07 NOTE — Progress Notes (Signed)
Interpreter Natale Milch present for patient's appointments today.

## 2020-08-07 NOTE — Progress Notes (Signed)
Provided patient a case of Vanilla Ensure at visit today.

## 2020-08-09 ENCOUNTER — Inpatient Hospital Stay: Payer: Medicare Other

## 2020-08-09 ENCOUNTER — Other Ambulatory Visit: Payer: Self-pay

## 2020-08-09 DIAGNOSIS — C787 Secondary malignant neoplasm of liver and intrahepatic bile duct: Secondary | ICD-10-CM | POA: Diagnosis not present

## 2020-08-09 DIAGNOSIS — C7989 Secondary malignant neoplasm of other specified sites: Secondary | ICD-10-CM | POA: Diagnosis not present

## 2020-08-09 DIAGNOSIS — C182 Malignant neoplasm of ascending colon: Secondary | ICD-10-CM

## 2020-08-09 DIAGNOSIS — Z5111 Encounter for antineoplastic chemotherapy: Secondary | ICD-10-CM | POA: Diagnosis not present

## 2020-08-09 DIAGNOSIS — D701 Agranulocytosis secondary to cancer chemotherapy: Secondary | ICD-10-CM | POA: Diagnosis not present

## 2020-08-09 DIAGNOSIS — C779 Secondary and unspecified malignant neoplasm of lymph node, unspecified: Secondary | ICD-10-CM | POA: Diagnosis not present

## 2020-08-09 DIAGNOSIS — C189 Malignant neoplasm of colon, unspecified: Secondary | ICD-10-CM

## 2020-08-09 MED ORDER — SODIUM CHLORIDE 0.9% FLUSH
10.0000 mL | INTRAVENOUS | Status: DC | PRN
  Administered 2020-08-09: 10 mL
  Filled 2020-08-09: qty 10

## 2020-08-09 MED ORDER — GABAPENTIN 300 MG PO CAPS: 300.0000 mg | ORAL_CAPSULE | Freq: Every day | ORAL | 3 refills | Status: DC

## 2020-08-09 MED ORDER — PEGFILGRASTIM-CBQV 6 MG/0.6ML ~~LOC~~ SOSY
6.0000 mg | PREFILLED_SYRINGE | Freq: Once | SUBCUTANEOUS | Status: AC
  Administered 2020-08-09: 6 mg via SUBCUTANEOUS
  Filled 2020-08-09: qty 0.6

## 2020-08-09 MED ORDER — VENTOLIN HFA 108 (90 BASE) MCG/ACT IN AERS: INHALATION_SPRAY | RESPIRATORY_TRACT | 2 refills | Status: DC

## 2020-08-09 MED ORDER — HEPARIN SOD (PORK) LOCK FLUSH 100 UNIT/ML IV SOLN
500.0000 [IU] | Freq: Once | INTRAVENOUS | Status: AC | PRN
  Administered 2020-08-09: 500 [IU]
  Filled 2020-08-09: qty 5

## 2020-08-09 NOTE — Patient Instructions (Signed)

## 2020-08-09 NOTE — Progress Notes (Signed)
Interpreter Anderson Malta Vega-Cock present during visit.

## 2020-08-15 ENCOUNTER — Other Ambulatory Visit: Payer: Self-pay | Admitting: Oncology

## 2020-08-15 ENCOUNTER — Other Ambulatory Visit (INDEPENDENT_AMBULATORY_CARE_PROVIDER_SITE_OTHER): Payer: Self-pay | Admitting: Primary Care

## 2020-08-15 NOTE — Telephone Encounter (Signed)
Sent request to PCP to refill if appropriate

## 2020-08-21 ENCOUNTER — Inpatient Hospital Stay: Payer: Medicare Other

## 2020-08-21 ENCOUNTER — Encounter: Payer: Self-pay | Admitting: Nurse Practitioner

## 2020-08-21 ENCOUNTER — Inpatient Hospital Stay (HOSPITAL_BASED_OUTPATIENT_CLINIC_OR_DEPARTMENT_OTHER): Payer: Medicare Other | Admitting: Nurse Practitioner

## 2020-08-21 ENCOUNTER — Other Ambulatory Visit: Payer: Self-pay

## 2020-08-21 ENCOUNTER — Inpatient Hospital Stay: Payer: Medicare Other | Attending: Nurse Practitioner

## 2020-08-21 VITALS — BP 110/83 | HR 92 | Temp 97.9°F | Resp 18 | Ht 70.0 in | Wt 144.0 lb

## 2020-08-21 DIAGNOSIS — Z5111 Encounter for antineoplastic chemotherapy: Secondary | ICD-10-CM | POA: Insufficient documentation

## 2020-08-21 DIAGNOSIS — C182 Malignant neoplasm of ascending colon: Secondary | ICD-10-CM | POA: Diagnosis not present

## 2020-08-21 DIAGNOSIS — D701 Agranulocytosis secondary to cancer chemotherapy: Secondary | ICD-10-CM | POA: Insufficient documentation

## 2020-08-21 DIAGNOSIS — C787 Secondary malignant neoplasm of liver and intrahepatic bile duct: Secondary | ICD-10-CM | POA: Diagnosis not present

## 2020-08-21 DIAGNOSIS — T451X5A Adverse effect of antineoplastic and immunosuppressive drugs, initial encounter: Secondary | ICD-10-CM | POA: Diagnosis not present

## 2020-08-21 DIAGNOSIS — C189 Malignant neoplasm of colon, unspecified: Secondary | ICD-10-CM

## 2020-08-21 LAB — CMP (CANCER CENTER ONLY)
ALT: 15 U/L (ref 0–44)
AST: 16 U/L (ref 15–41)
Albumin: 3.9 g/dL (ref 3.5–5.0)
Alkaline Phosphatase: 93 U/L (ref 38–126)
Anion gap: 9 (ref 5–15)
BUN: 12 mg/dL (ref 6–20)
CO2: 24 mmol/L (ref 22–32)
Calcium: 8.7 mg/dL — ABNORMAL LOW (ref 8.9–10.3)
Chloride: 108 mmol/L (ref 98–111)
Creatinine: 1.66 mg/dL — ABNORMAL HIGH (ref 0.61–1.24)
GFR, Estimated: 49 mL/min — ABNORMAL LOW (ref >60)
Glucose, Bld: 103 mg/dL — ABNORMAL HIGH (ref 70–99)
Potassium: 4 mmol/L (ref 3.5–5.1)
Sodium: 141 mmol/L (ref 135–145)
Total Bilirubin: 0.4 mg/dL (ref 0.3–1.2)
Total Protein: 6.6 g/dL (ref 6.5–8.1)

## 2020-08-21 LAB — CBC WITH DIFFERENTIAL (CANCER CENTER ONLY)
Abs Immature Granulocytes: 0.39 10*3/uL — ABNORMAL HIGH (ref 0.00–0.07)
Basophils Absolute: 0 10*3/uL (ref 0.0–0.1)
Basophils Relative: 1 %
Eosinophils Absolute: 0.1 10*3/uL (ref 0.0–0.5)
Eosinophils Relative: 1 %
HCT: 38.9 % — ABNORMAL LOW (ref 39.0–52.0)
Hemoglobin: 11.9 g/dL — ABNORMAL LOW (ref 13.0–17.0)
Immature Granulocytes: 5 %
Lymphocytes Relative: 22 %
Lymphs Abs: 1.7 10*3/uL (ref 0.7–4.0)
MCH: 24.6 pg — ABNORMAL LOW (ref 26.0–34.0)
MCHC: 30.6 g/dL (ref 30.0–36.0)
MCV: 80.4 fL (ref 80.0–100.0)
Monocytes Absolute: 0.9 10*3/uL (ref 0.1–1.0)
Monocytes Relative: 11 %
Neutro Abs: 4.7 10*3/uL (ref 1.7–7.7)
Neutrophils Relative %: 60 %
Platelet Count: 127 10*3/uL — ABNORMAL LOW (ref 150–400)
RBC: 4.84 MIL/uL (ref 4.22–5.81)
RDW: 21 % — ABNORMAL HIGH (ref 11.5–15.5)
WBC Count: 7.7 10*3/uL (ref 4.0–10.5)
nRBC: 0.9 % — ABNORMAL HIGH (ref 0.0–0.2)

## 2020-08-21 MED ORDER — SODIUM CHLORIDE 0.9 % IV SOLN
400.0000 mg/m2 | Freq: Once | INTRAVENOUS | Status: AC
  Administered 2020-08-21: 716 mg via INTRAVENOUS
  Filled 2020-08-21: qty 35.8

## 2020-08-21 MED ORDER — SODIUM CHLORIDE 0.9 % IV SOLN
10.0000 mg | Freq: Once | INTRAVENOUS | Status: AC
  Administered 2020-08-21: 10 mg via INTRAVENOUS
  Filled 2020-08-21: qty 1

## 2020-08-21 MED ORDER — ATROPINE SULFATE 1 MG/ML IJ SOLN
0.5000 mg | Freq: Once | INTRAMUSCULAR | Status: AC | PRN
  Administered 2020-08-21: 0.5 mg via INTRAVENOUS
  Filled 2020-08-21: qty 1

## 2020-08-21 MED ORDER — FLUOROURACIL CHEMO INJECTION 2.5 GM/50ML
400.0000 mg/m2 | Freq: Once | INTRAVENOUS | Status: AC
  Administered 2020-08-21: 700 mg via INTRAVENOUS
  Filled 2020-08-21: qty 14

## 2020-08-21 MED ORDER — SODIUM CHLORIDE 0.9% FLUSH
10.0000 mL | INTRAVENOUS | Status: DC | PRN
  Filled 2020-08-21: qty 10

## 2020-08-21 MED ORDER — SODIUM CHLORIDE 0.9 % IV SOLN
Freq: Once | INTRAVENOUS | Status: AC
  Filled 2020-08-21: qty 250

## 2020-08-21 MED ORDER — PALONOSETRON HCL INJECTION 0.25 MG/5ML
0.2500 mg | Freq: Once | INTRAVENOUS | Status: AC
  Administered 2020-08-21: 0.25 mg via INTRAVENOUS
  Filled 2020-08-21: qty 5

## 2020-08-21 MED ORDER — FLUOROURACIL CHEMO INJECTION 5 GM/100ML
2400.0000 mg/m2 | INTRAVENOUS | Status: DC
  Administered 2020-08-21: 4300 mg via INTRAVENOUS
  Filled 2020-08-21: qty 86

## 2020-08-21 MED ORDER — SODIUM CHLORIDE 0.9 % IV SOLN
180.0000 mg/m2 | Freq: Once | INTRAVENOUS | Status: AC
  Administered 2020-08-21: 320 mg via INTRAVENOUS
  Filled 2020-08-21: qty 15

## 2020-08-21 NOTE — Progress Notes (Signed)
Per Ned Card, NP, ok to treat with Scr 1.66.  Carlisle Cater, Interpreter present during all appointments today.

## 2020-08-21 NOTE — Progress Notes (Signed)
[facility] OFFICE PROGRESS NOTE   Diagnosis: Colon cancer  INTERVAL HISTORY:   Alexander Reeves returns as scheduled.  He completed another cycle of FOLFIRI 08/07/2020.  He had a single episode of vomiting.  No diarrhea.  No mouth sores.  He has mild intermittent dyspnea on exertion.  No change over time.  No fever or cough.  He has episodes of "heartburn".  He is not taking Protonix consistently.  He takes Mylanta with some improvement.  He has a good appetite, good fluid intake.  Objective:  Vital signs in last 24 hours:  Blood pressure 110/83, pulse 92, temperature 97.9 F (36.6 C), temperature source Oral, resp. rate 18, height '5\' 10"'  (1.778 m), weight 144 lb (65.3 kg), SpO2 99 %.    HEENT:  No thrush or ulcers. Resp: Lungs clear bilaterally. Cardio: Regular rate and rhythm. GI: Abdomen soft and nontender.  No hepatomegaly. Vascular: No leg edema. Port-A-Cath without erythema.  Lab Results:  Lab Results  Component Value Date   WBC 7.7 08/21/2020   HGB 11.9 (L) 08/21/2020   HCT 38.9 (L) 08/21/2020   MCV 80.4 08/21/2020   PLT 127 (L) 08/21/2020   NEUTROABS 4.7 08/21/2020    Imaging:  No results found.  Medications: I have reviewed the patient's current medications.  Assessment/Plan: Moderately differentiated adenocarcinoma ascending colon, stage IIIb (pT3pN1), status post a right colectomy 11/19/2018 Lymphovascular and perineural invasion present, 2/14 lymph nodes positive, tumor deposits present Positive radial margin, no loss of mismatch repair protein expression, discussed case with Dr. Craig Staggers mass with surrounding inflammation at multiple margins, no gross residual disease, unclear which is the "positive "radial margin, further surgery and radiation not recommended Colonoscopy 11/19/2018-completely obstructing mid ascending colon mass, could not be passed with endoscope, biopsy confirmed invasive adenocarcinoma CT abdomen/pelvis 11/18/2018-wall  thickening at the mid and distal ascending colon with mild distention of the proximal ascending colon and cecum CTs 10/17/2018--no acute findings, no chest lymphadenopathy, lungs clear Cycle 1 FOLFOX 12/28/2018 Cycle 2 FOLFOX 01/11/2019, Udenyca added  Cycle 3 FOLFOX 01/25/2019, Udenyca held due to bone pain Cycle 4 FOLFOX 02/21/2019, Udenyca added Cycle 5 FOLFOX 03/09/2019, Udenyca Cycle 6 FOLFOX 03/21/2019, Udenyca Cycle 7 FOLFOX 04/04/2019, Udenyca Cycle 8 FOLFOX 04/18/2019, Udenyca  Cycle 9 FOLFOX 05/02/2019, Udenyca Cycle 10 FOLFOX 05/16/2019, oxaliplatin, 5-FU bolus, and Udenyca held Cycle 11 FOLFOX 05/30/2019, oxaliplatin, 5-FU bolus, and Udenyca held Cycle 12 FOLFOX 06/15/2019, oxaliplatin, 5-FU bolus and Udenyca held CTs 09/29/2019-new hypodense enhancing liver masses consistent with metastatic disease, indistinct marginated nodularity below the pancreas head-likely small lymph nodes Biopsy liver lesion 10/18/2019-adenocarcinoma consistent with history of colorectal carcinoma Cycle 1 FOLFIRI/Avastin 11/01/2019 Cycle 2 FOLFIRI/Avastin 11/15/2019 Cycle 3 FOLFIRI/Avastin 11/29/2019 Cycle 4 FOLFIRI/Avastin 12/13/2019 CT chest 12/27/2019-no significant change in hepatic metastases, submassive pulmonary emboli Cycle 5 FOLFIRI 01/10/2020 (Avastin discontinued) Cycle 6 FOLFIRI 02/01/2020 Cycle 7 FOLFIRI 02/13/2020 Cycle 8 FOLFIRI 02/27/2020 CTs abdomen/pelvis 03/09/2020-per Dr. Gearldine Shown review overall stable disease Cycle 9 FOLFIRI 04/16/2020 Cycle 10 FOLFIRI 05/01/2020 Cycle 11 FOLFIRI 05/15/2020 Cycle 12 FOLFIRI 05/29/2020 CT abdomen/pelvis 06/07/2020- decreased size of liver lesions, no new lesions Cycle 13 FOLFIRI 06/12/2020 Cycle 14 FOLFIRI 07/10/2020 Cycle 15 FOLFIRI 07/24/2020 Cycle 16 FOLFIRI 08/07/2020 Cycle 17 FOLFIRI 08/21/2020   Deaf Right epididymal cyst removal 03/22/2018 Asthma Port-A-Cath placement, [name], 12/23/2018 Neutropenia secondary to chemotherapy-Udenyca added for cycle 2  FOLFOX Admission with febrile neutropenia 02/08/2019 Hospital admission with submassive PE and left lower extremity DVT on 12/27/2019, heparin, transition to Xarelto 12/28/2019 CT  chest 12/27/2019-submassive pulmonary emboli with evidence of right heart strain, no significant change in hepatic metastases      Disposition: Alexander Reeves appears stable.  He has completed 16 cycles of FOLFIRI.  There is no clinical evidence of disease progression.  Plan to proceed with cycle 17 today as scheduled.  Overall plan is for CT scans after cycle 18.  We reviewed the CBC and chemistry panel from today.  Labs are adequate to proceed with treatment.  He has mild thrombocytopenia.  He will contact the office with bleeding.  Creatinine is elevated.  Review of labs in the EMR show intermittent elevation in the past, similar to today's value.  We will continue to monitor.  He will return for lab, follow-up, FOLFIRI in 2 weeks.  He will contact the office in the interim with any problems.  A sign language interpreter was present throughout today's visit.      Ned Card ANP/GNP-BC   08/21/2020  10:54 AM

## 2020-08-21 NOTE — Patient Instructions (Addendum)
The chemotherapy medication bag should finish at 46 hours after it was put on.  Estimated time to finish at 12pm on 08/23/20.   If the display on your pump reads "Low Volume" and it is beeping, take the batteries out of the pump and come to the cancer center for it to be taken off.   If the pump alarms go off prior to the pump reading "Low Volume" then call [phone] and someone can assist you.  If the plunger comes out and the chemotherapy medication is leaking out, please use your home chemo spill kit to clean up the spill. Do NOT use paper towels or other household products.  If you have problems or questions regarding your pump, please call either [phone] (24 hours a day) or the cancer center Monday-Friday 8:00 a.m.- 4:30 p.m. at the clinic number and we will assist you. If you are unable to get assistance, then go to the nearest Emergency Department and ask the staff to contact the IV team for assistance.   [facility]   Discharge Instructions: Thank you for choosing [facility] to provide your oncology and hematology care.   If you have a lab appointment with the [facility], please go directly to the [facility] and check in at the registration area.   Wear comfortable clothing and clothing appropriate for easy access to any Portacath or PICC line.   We strive to give you quality time with your provider. You may need to reschedule your appointment if you arrive late (15 or more minutes).  Arriving late affects you and other patients whose appointments are after yours.  Also, if you miss three or more appointments without notifying the office, you may be dismissed from the clinic at the provider's discretion.      For prescription refill requests, have your pharmacy contact our office and allow 72 hours for refills to be completed.    Today you received the following chemotherapy and/or immunotherapy agents: Irinotecan, Leucovorin,  5FU  To help prevent nausea and vomiting after your treatment, we encourage you to take your nausea medication as directed.  BELOW ARE SYMPTOMS THAT SHOULD BE REPORTED IMMEDIATELY: *FEVER GREATER THAN 100.4 F (38 C) OR HIGHER *CHILLS OR SWEATING *NAUSEA AND VOMITING THAT IS NOT CONTROLLED WITH YOUR NAUSEA MEDICATION *UNUSUAL SHORTNESS OF BREATH *UNUSUAL BRUISING OR BLEEDING *URINARY PROBLEMS (pain or burning when urinating, or frequent urination) *BOWEL PROBLEMS (unusual diarrhea, constipation, pain near the anus) TENDERNESS IN MOUTH AND THROAT WITH OR WITHOUT PRESENCE OF ULCERS (sore throat, sores in mouth, or a toothache) UNUSUAL RASH, SWELLING OR PAIN  UNUSUAL VAGINAL DISCHARGE OR ITCHING   Items with * indicate a potential emergency and should be followed up as soon as possible or go to the Emergency Department if any problems should occur.  Please show the CHEMOTHERAPY ALERT CARD or IMMUNOTHERAPY ALERT CARD at check-in to the Emergency Department and triage nurse.  Should you have questions after your visit or need to cancel or reschedule your appointment, please contact [facility]  Dept: [phone]  and follow the prompts.  Office hours are 8:00 a.m. to 4:30 p.m. Monday - Friday. Please note that voicemails left after 4:00 p.m. may not be returned until the following business day.  We are closed weekends and major holidays. You have access to a nurse at all times for urgent questions. Please call the main number to the clinic Dept: [phone] and follow  the prompts.   For any non-urgent questions, you may also contact your provider using MyChart. We now offer e-Visits for anyone 50 and older to request care online for non-urgent symptoms. For details visit [email or web address].   Also download the MyChart app! Go to the app store, search "MyChart", open the app, select [facility], and log in with your MyChart username and password.  Due to Covid,  a mask is required upon entering the hospital/clinic. If you do not have a mask, one will be given to you upon arrival. For doctor visits, patients may have 1 support person aged 52 or older with them. For treatment visits, patients cannot have anyone with them due to current Covid guidelines and our immunocompromised population.   Irinotecan injection What is this medicine? IRINOTECAN (ir in oh TEE kan ) is a chemotherapy drug. It is used to treat colon and rectal cancer. This medicine may be used for other purposes; ask your health care provider or pharmacist if you have questions. COMMON BRAND NAME(S): Camptosar What should I tell my health care provider before I take this medicine? They need to know if you have any of these conditions: dehydration diarrhea infection (especially a virus infection such as chickenpox, cold sores, or herpes) liver disease low blood counts, like low white cell, platelet, or red cell counts low levels of calcium, magnesium, or potassium in the blood recent or ongoing radiation therapy an unusual or allergic reaction to irinotecan, other medicines, foods, dyes, or preservatives pregnant or trying to get pregnant breast-feeding How should I use this medicine? This drug is given as an infusion into a vein. It is administered in a hospital or clinic by a specially trained health care professional. Talk to your pediatrician regarding the use of this medicine in children. Special care may be needed. Overdosage: If you think you have taken too much of this medicine contact a poison control center or emergency room at once. NOTE: This medicine is only for you. Do not share this medicine with others. What if I miss a dose? It is important not to miss your dose. Call your doctor or health care professional if you are unable to keep an appointment. What may interact with this medicine? Do not take this medicine with any of the following  medications: cobicistat itraconazole This medicine may interact with the following medications: antiviral medicines for HIV or AIDS certain antibiotics like rifampin or rifabutin certain medicines for fungal infections like ketoconazole, posaconazole, and voriconazole certain medicines for seizures like carbamazepine, phenobarbital, phenotoin clarithromycin gemfibrozil nefazodone [facility]. John's Wort This list may not describe all possible interactions. Give your health care provider a list of all the medicines, herbs, non-prescription drugs, or dietary supplements you use. Also tell them if you smoke, drink alcohol, or use illegal drugs. Some items may interact with your medicine. What should I watch for while using this medicine? Your condition will be monitored carefully while you are receiving this medicine. You will need important blood work done while you are taking this medicine. This drug may make you feel generally unwell. This is not uncommon, as chemotherapy can affect healthy cells as well as cancer cells. Report any side effects. Continue your course of treatment even though you feel ill unless your doctor tells you to stop. In some cases, you may be given additional medicines to help with side effects. Follow all directions for their use. You may get drowsy or dizzy. Do not drive, use machinery, or do anything that  needs mental alertness until you know how this medicine affects you. Do not stand or sit up quickly, especially if you are an older patient. This reduces the risk of dizzy or fainting spells. Call your health care professional for advice if you get a fever, chills, or sore throat, or other symptoms of a cold or flu. Do not treat yourself. This medicine decreases your body's ability to fight infections. Try to avoid being around people who are sick. Avoid taking products that contain aspirin, acetaminophen, ibuprofen, naproxen, or ketoprofen unless instructed by your doctor.  These medicines may hide a fever. This medicine may increase your risk to bruise or bleed. Call your doctor or health care professional if you notice any unusual bleeding. Be careful brushing and flossing your teeth or using a toothpick because you may get an infection or bleed more easily. If you have any dental work done, tell your dentist you are receiving this medicine. Do not become pregnant while taking this medicine or for 6 months after stopping it. Women should inform their health care professional if they wish to become pregnant or think they might be pregnant. Men should not father a child while taking this medicine and for 3 months after stopping it. There is potential for serious side effects to an unborn child. Talk to your health care professional for more information. Do not breast-feed an infant while taking this medicine or for 7 days after stopping it. This medicine has caused ovarian failure in some women. This medicine may make it more difficult to get pregnant. Talk to your health care professional if you are concerned about your fertility. This medicine has caused decreased sperm counts in some men. This may make it more difficult to father a child. Talk to your health care professional if you are concerned about your fertility. What side effects may I notice from receiving this medicine? Side effects that you should report to your doctor or health care professional as soon as possible: allergic reactions like skin rash, itching or hives, swelling of the face, lips, or tongue chest pain diarrhea flushing, runny nose, sweating during infusion low blood counts - this medicine may decrease the number of white blood cells, red blood cells and platelets. You may be at increased risk for infections and bleeding. nausea, vomiting pain, swelling, warmth in the leg signs of decreased platelets or bleeding - bruising, pinpoint red spots on the skin, black, tarry stools, blood in the  urine signs of infection - fever or chills, cough, sore throat, pain or difficulty passing urine signs of decreased red blood cells - unusually weak or tired, fainting spells, lightheadedness Side effects that usually do not require medical attention (report to your doctor or health care professional if they continue or are bothersome): constipation hair loss headache loss of appetite mouth sores stomach pain This list may not describe all possible side effects. Call your doctor for medical advice about side effects. You may report side effects to FDA at 1-800-FDA-1088. Where should I keep my medicine? This drug is given in a hospital or clinic and will not be stored at home. NOTE: This sheet is a summary. It may not cover all possible information. If you have questions about this medicine, talk to your doctor, pharmacist, or health care provider.  2021 Elsevier/Gold Standard (2018-11-30 17:46:13)  Leucovorin injection What is this medicine? LEUCOVORIN (loo koe VOR in) is used to prevent or treat the harmful effects of some medicines. This medicine is used to  treat anemia caused by a low amount of folic acid in the body. It is also used with 5-fluorouracil (5-FU) to treat colon cancer. This medicine may be used for other purposes; ask your health care provider or pharmacist if you have questions. What should I tell my health care provider before I take this medicine? They need to know if you have any of these conditions: anemia from low levels of vitamin B-12 in the blood an unusual or allergic reaction to leucovorin, folic acid, other medicines, foods, dyes, or preservatives pregnant or trying to get pregnant breast-feeding How should I use this medicine? This medicine is for injection into a muscle or into a vein. It is given by a health care professional in a hospital or clinic setting. Talk to your pediatrician regarding the use of this medicine in children. Special care may be  needed. Overdosage: If you think you have taken too much of this medicine contact a poison control center or emergency room at once. NOTE: This medicine is only for you. Do not share this medicine with others. What if I miss a dose? This does not apply. What may interact with this medicine? capecitabine fluorouracil phenobarbital phenytoin primidone trimethoprim-sulfamethoxazole This list may not describe all possible interactions. Give your health care provider a list of all the medicines, herbs, non-prescription drugs, or dietary supplements you use. Also tell them if you smoke, drink alcohol, or use illegal drugs. Some items may interact with your medicine. What should I watch for while using this medicine? Your condition will be monitored carefully while you are receiving this medicine. This medicine may increase the side effects of 5-fluorouracil, 5-FU. Tell your doctor or health care professional if you have diarrhea or mouth sores that do not get better or that get worse. What side effects may I notice from receiving this medicine? Side effects that you should report to your doctor or health care professional as soon as possible: allergic reactions like skin rash, itching or hives, swelling of the face, lips, or tongue breathing problems fever, infection mouth sores unusual bleeding or bruising unusually weak or tired Side effects that usually do not require medical attention (report to your doctor or health care professional if they continue or are bothersome): constipation or diarrhea loss of appetite nausea, vomiting This list may not describe all possible side effects. Call your doctor for medical advice about side effects. You may report side effects to FDA at 1-800-FDA-1088. Where should I keep my medicine? This drug is given in a hospital or clinic and will not be stored at home. NOTE: This sheet is a summary. It may not cover all possible information. If you have  questions about this medicine, talk to your doctor, pharmacist, or health care provider.  2021 Elsevier/Gold Standard (2007-07-06 16:50:29)  Fluorouracil, 5-FU injection What is this medicine? FLUOROURACIL, 5-FU (flure oh YOOR a sil) is a chemotherapy drug. It slows the growth of cancer cells. This medicine is used to treat many types of cancer like breast cancer, colon or rectal cancer, pancreatic cancer, and stomach cancer. This medicine may be used for other purposes; ask your health care provider or pharmacist if you have questions. COMMON BRAND NAME(S): Adrucil What should I tell my health care provider before I take this medicine? They need to know if you have any of these conditions: blood disorders dihydropyrimidine dehydrogenase (DPD) deficiency infection (especially a virus infection such as chickenpox, cold sores, or herpes) kidney disease liver disease malnourished, poor nutrition recent  or ongoing radiation therapy an unusual or allergic reaction to fluorouracil, other chemotherapy, other medicines, foods, dyes, or preservatives pregnant or trying to get pregnant breast-feeding How should I use this medicine? This drug is given as an infusion or injection into a vein. It is administered in a hospital or clinic by a specially trained health care professional. Talk to your pediatrician regarding the use of this medicine in children. Special care may be needed. Overdosage: If you think you have taken too much of this medicine contact a poison control center or emergency room at once. NOTE: This medicine is only for you. Do not share this medicine with others. What if I miss a dose? It is important not to miss your dose. Call your doctor or health care professional if you are unable to keep an appointment. What may interact with this medicine? Do not take this medicine with any of the following medications: live virus vaccines This medicine may also interact with the following  medications: medicines that treat or prevent blood clots like warfarin, enoxaparin, and dalteparin This list may not describe all possible interactions. Give your health care provider a list of all the medicines, herbs, non-prescription drugs, or dietary supplements you use. Also tell them if you smoke, drink alcohol, or use illegal drugs. Some items may interact with your medicine. What should I watch for while using this medicine? Visit your doctor for checks on your progress. This drug may make you feel generally unwell. This is not uncommon, as chemotherapy can affect healthy cells as well as cancer cells. Report any side effects. Continue your course of treatment even though you feel ill unless your doctor tells you to stop. In some cases, you may be given additional medicines to help with side effects. Follow all directions for their use. Call your doctor or health care professional for advice if you get a fever, chills or sore throat, or other symptoms of a cold or flu. Do not treat yourself. This drug decreases your body's ability to fight infections. Try to avoid being around people who are sick. This medicine may increase your risk to bruise or bleed. Call your doctor or health care professional if you notice any unusual bleeding. Be careful brushing and flossing your teeth or using a toothpick because you may get an infection or bleed more easily. If you have any dental work done, tell your dentist you are receiving this medicine. Avoid taking products that contain aspirin, acetaminophen, ibuprofen, naproxen, or ketoprofen unless instructed by your doctor. These medicines may hide a fever. Do not become pregnant while taking this medicine. Women should inform their doctor if they wish to become pregnant or think they might be pregnant. There is a potential for serious side effects to an unborn child. Talk to your health care professional or pharmacist for more information. Do not breast-feed an  infant while taking this medicine. Men should inform their doctor if they wish to father a child. This medicine may lower sperm counts. Do not treat diarrhea with over the counter products. Contact your doctor if you have diarrhea that lasts more than 2 days or if it is severe and watery. This medicine can make you more sensitive to the sun. Keep out of the sun. If you cannot avoid being in the sun, wear protective clothing and use sunscreen. Do not use sun lamps or tanning beds/booths. What side effects may I notice from receiving this medicine? Side effects that you should report to your doctor  or health care professional as soon as possible: allergic reactions like skin rash, itching or hives, swelling of the face, lips, or tongue low blood counts - this medicine may decrease the number of white blood cells, red blood cells and platelets. You may be at increased risk for infections and bleeding. signs of infection - fever or chills, cough, sore throat, pain or difficulty passing urine signs of decreased platelets or bleeding - bruising, pinpoint red spots on the skin, black, tarry stools, blood in the urine signs of decreased red blood cells - unusually weak or tired, fainting spells, lightheadedness breathing problems changes in vision chest pain mouth sores nausea and vomiting pain, swelling, redness at site where injected pain, tingling, numbness in the hands or feet redness, swelling, or sores on hands or feet stomach pain unusual bleeding Side effects that usually do not require medical attention (report to your doctor or health care professional if they continue or are bothersome): changes in finger or toe nails diarrhea dry or itchy skin hair loss headache loss of appetite sensitivity of eyes to the light stomach upset unusually teary eyes This list may not describe all possible side effects. Call your doctor for medical advice about side effects. You may report side effects  to FDA at 1-800-FDA-1088. Where should I keep my medicine? This drug is given in a hospital or clinic and will not be stored at home. NOTE: This sheet is a summary. It may not cover all possible information. If you have questions about this medicine, talk to your doctor, pharmacist, or health care provider.  2021 Elsevier/Gold Standard (2018-11-30 15:00:03)

## 2020-08-21 NOTE — Patient Instructions (Signed)
Implanted Port Home Guide An implanted port is a device that is placed under the skin. It is usually placed in the chest. The device can be used to give IV medicine, to take blood, or for dialysis. You may have an implanted port if: You need IV medicine that would be irritating to the small veins in your hands or arms. You need IV medicines, such as antibiotics, for a long period of time. You need IV nutrition for a long period of time. You need dialysis. When you have a port, your health care provider can choose to use the port instead of veins in your arms for these procedures. You may have fewer limitations when using a port than you would if you used other types of long-term IVs, and you will likely be able to return to normal activities afteryour incision heals. An implanted port has two main parts: Reservoir. The reservoir is the part where a needle is inserted to give medicines or draw blood. The reservoir is round. After it is placed, it appears as a small, raised area under your skin. Catheter. The catheter is a thin, flexible tube that connects the reservoir to a vein. Medicine that is inserted into the reservoir goes into the catheter and then into the vein. How is my port accessed? To access your port: A numbing cream may be placed on the skin over the port site. Your health care provider will put on a mask and sterile gloves. The skin over your port will be cleaned carefully with a germ-killing soap and allowed to dry. Your health care provider will gently pinch the port and insert a needle into it. Your health care provider will check for a blood return to make sure the port is in the vein and is not clogged. If your port needs to remain accessed to get medicine continuously (constant infusion), your health care provider will place a clear bandage (dressing) over the needle site. The dressing and needle will need to be changed every week, or as told by your health care provider. What  is flushing? Flushing helps keep the port from getting clogged. Follow instructions from your health care provider about how and when to flush the port. Ports are usually flushed with saline solution or a medicine called heparin. The need for flushing will depend on how the port is used: If the port is only used from time to time to give medicines or draw blood, the port may need to be flushed: Before and after medicines have been given. Before and after blood has been drawn. As part of routine maintenance. Flushing may be recommended every 4-6 weeks. If a constant infusion is running, the port may not need to be flushed. Throw away any syringes in a disposal container that is meant for sharp items (sharps container). You can buy a sharps container from a pharmacy, or you can make one by using an empty hard plastic bottle with a cover. How long will my port stay implanted? The port can stay in for as long as your health care provider thinks it is needed. When it is time for the port to come out, a surgery will be done to remove it. The surgery will be similar to the procedure that was done to putthe port in. Follow these instructions at home:  Flush your port as told by your health care provider. If you need an infusion over several days, follow instructions from your health care provider about how to take   care of your port site. Make sure you: Wash your hands with soap and water before you change your dressing. If soap and water are not available, use alcohol-based hand sanitizer. Change your dressing as told by your health care provider. Place any used dressings or infusion bags into a plastic bag. Throw that bag in the trash. Keep the dressing that covers the needle clean and dry. Do not get it wet. Do not use scissors or sharp objects near the tube. Keep the tube clamped, unless it is being used. Check your port site every day for signs of infection. Check for: Redness, swelling, or  pain. Fluid or blood. Pus or a bad smell. Protect the skin around the port site. Avoid wearing bra straps that rub or irritate the site. Protect the skin around your port from seat belts. Place a soft pad over your chest if needed. Bathe or shower as told by your health care provider. The site may get wet as long as you are not actively receiving an infusion. Return to your normal activities as told by your health care provider. Ask your health care provider what activities are safe for you. Carry a medical alert card or wear a medical alert bracelet at all times. This will let health care providers know that you have an implanted port in case of an emergency. Get help right away if: You have redness, swelling, or pain at the port site. You have fluid or blood coming from your port site. You have pus or a bad smell coming from the port site. You have a fever. Summary Implanted ports are usually placed in the chest for long-term IV access. Follow instructions from your health care provider about flushing the port and changing bandages (dressings). Take care of the area around your port by avoiding clothing that puts pressure on the area, and by watching for signs of infection. Protect the skin around your port from seat belts. Place a soft pad over your chest if needed. Get help right away if you have a fever or you have redness, swelling, pain, drainage, or a bad smell at the port site. This information is not intended to replace advice given to you by your health care provider. Make sure you discuss any questions you have with your healthcare provider. Document Revised: 05/16/2019 Document Reviewed: 05/16/2019 Elsevier Patient Education  2022 [employer]  

## 2020-08-23 ENCOUNTER — Inpatient Hospital Stay: Payer: Medicare Other

## 2020-08-23 ENCOUNTER — Ambulatory Visit (HOSPITAL_BASED_OUTPATIENT_CLINIC_OR_DEPARTMENT_OTHER)
Admission: RE | Admit: 2020-08-23 | Discharge: 2020-08-23 | Disposition: A | Discharge status: Home / Self Care | Payer: Medicare Other | Source: Ambulatory Visit | Attending: Nurse Practitioner | Admitting: Nurse Practitioner

## 2020-08-23 ENCOUNTER — Other Ambulatory Visit: Payer: Self-pay

## 2020-08-23 ENCOUNTER — Other Ambulatory Visit: Payer: Self-pay | Admitting: Nurse Practitioner

## 2020-08-23 VITALS — BP 127/64 | HR 105 | Temp 98.6°F | Resp 20

## 2020-08-23 DIAGNOSIS — C182 Malignant neoplasm of ascending colon: Secondary | ICD-10-CM

## 2020-08-23 DIAGNOSIS — C189 Malignant neoplasm of colon, unspecified: Secondary | ICD-10-CM | POA: Diagnosis not present

## 2020-08-23 DIAGNOSIS — R0600 Dyspnea, unspecified: Secondary | ICD-10-CM | POA: Diagnosis not present

## 2020-08-23 DIAGNOSIS — D701 Agranulocytosis secondary to cancer chemotherapy: Secondary | ICD-10-CM | POA: Diagnosis not present

## 2020-08-23 DIAGNOSIS — T451X5A Adverse effect of antineoplastic and immunosuppressive drugs, initial encounter: Secondary | ICD-10-CM | POA: Diagnosis not present

## 2020-08-23 DIAGNOSIS — C787 Secondary malignant neoplasm of liver and intrahepatic bile duct: Secondary | ICD-10-CM | POA: Diagnosis not present

## 2020-08-23 DIAGNOSIS — Z5111 Encounter for antineoplastic chemotherapy: Secondary | ICD-10-CM | POA: Diagnosis not present

## 2020-08-23 MED ORDER — SODIUM CHLORIDE 0.9% FLUSH
10.0000 mL | INTRAVENOUS | Status: DC | PRN
  Administered 2020-08-23: 10 mL
  Filled 2020-08-23: qty 10

## 2020-08-23 MED ORDER — HEPARIN SOD (PORK) LOCK FLUSH 100 UNIT/ML IV SOLN
500.0000 [IU] | Freq: Once | INTRAVENOUS | Status: AC | PRN
  Administered 2020-08-23: 500 [IU]
  Filled 2020-08-23: qty 5

## 2020-08-23 MED ORDER — METHYLPREDNISOLONE 4 MG PO TBPK: ORAL_TABLET | ORAL | 0 refills | Status: AC

## 2020-08-23 MED ORDER — PEGFILGRASTIM-CBQV 6 MG/0.6ML ~~LOC~~ SOSY
6.0000 mg | PREFILLED_SYRINGE | Freq: Once | SUBCUTANEOUS | Status: AC
  Administered 2020-08-23: 6 mg via SUBCUTANEOUS

## 2020-08-23 NOTE — Progress Notes (Signed)
Alexander Reeves interpreter present during visit and interpreting for patient. Patient has dyspnea on exertion x 3 days without improvement. Ned Card NP to see patient in infusion area.

## 2020-08-23 NOTE — Patient Instructions (Signed)
[facility]  Discharge Instructions: Thank you for choosing [facility] to provide your oncology and hematology care.   If you have a lab appointment with the [facility], please go directly to the [facility] and check in at the registration area.   Wear comfortable clothing and clothing appropriate for easy access to any Portacath or PICC line.   We strive to give you quality time with your provider. You may need to reschedule your appointment if you arrive late (15 or more minutes).  Arriving late affects you and other patients whose appointments are after yours.  Also, if you miss three or more appointments without notifying the office, you may be dismissed from the clinic at the provider's discretion.      For prescription refill requests, have your pharmacy contact our office and allow 72 hours for refills to be completed.    Today you received the following chemotherapy and/or immunotherapy agents: 31f  Fluorouracil, 5-FU injection What is this medication? FLUOROURACIL, 5-FU (flure oh YOOR a sil) is a chemotherapy drug. It slows the growth of cancer cells. This medicine is used to treat many types of cancer like breast cancer, colon or rectal cancer, pancreatic cancer, and stomachcancer. This medicine may be used for other purposes; ask your health care provider orpharmacist if you have questions. COMMON BRAND NAME(S): Adrucil What should I tell my care team before I take this medication? They need to know if you have any of these conditions: blood disorders dihydropyrimidine dehydrogenase (DPD) deficiency infection (especially a virus infection such as chickenpox, cold sores, or herpes) kidney disease liver disease malnourished, poor nutrition recent or ongoing radiation therapy an unusual or allergic reaction to fluorouracil, other chemotherapy, other medicines, foods, dyes, or preservatives pregnant or trying to get  pregnant breast-feeding How should I use this medication? This drug is given as an infusion or injection into a vein. It is administeredin a hospital or clinic by a specially trained health care professional. Talk to your pediatrician regarding the use of this medicine in children.Special care may be needed. Overdosage: If you think you have taken too much of this medicine contact apoison control center or emergency room at once. NOTE: This medicine is only for you. Do not share this medicine with others. What if I miss a dose? It is important not to miss your dose. Call your doctor or health careprofessional if you are unable to keep an appointment. What may interact with this medication? Do not take this medicine with any of the following medications: live virus vaccines This medicine may also interact with the following medications: medicines that treat or prevent blood clots like warfarin, enoxaparin, and dalteparin This list may not describe all possible interactions. Give your health care provider a list of all the medicines, herbs, non-prescription drugs, or dietary supplements you use. Also tell them if you smoke, drink alcohol, or use illegaldrugs. Some items may interact with your medicine. What should I watch for while using this medication? Visit your doctor for checks on your progress. This drug may make you feel generally unwell. This is not uncommon, as chemotherapy can affect healthy cells as well as cancer cells. Report any side effects. Continue your course oftreatment even though you feel ill unless your doctor tells you to stop. In some cases, you may be given additional medicines to help with side effects.Follow all directions for their use. Call your doctor or health care professional for advice if you  get a fever, chills or sore throat, or other symptoms of a cold or flu. Do not treat yourself. This drug decreases your body's ability to fight infections. Try toavoid being  around people who are sick. This medicine may increase your risk to bruise or bleed. Call your doctor orhealth care professional if you notice any unusual bleeding. Be careful brushing and flossing your teeth or using a toothpick because you may get an infection or bleed more easily. If you have any dental work done,tell your dentist you are receiving this medicine. Avoid taking products that contain aspirin, acetaminophen, ibuprofen, naproxen, or ketoprofen unless instructed by your doctor. These medicines may hide afever. Do not become pregnant while taking this medicine. Women should inform their doctor if they wish to become pregnant or think they might be pregnant. There is a potential for serious side effects to an unborn child. Talk to your health care professional or pharmacist for more information. Do not breast-feed aninfant while taking this medicine. Men should inform their doctor if they wish to father a child. This medicinemay lower sperm counts. Do not treat diarrhea with over the counter products. Contact your doctor ifyou have diarrhea that lasts more than 2 days or if it is severe and watery. This medicine can make you more sensitive to the sun. Keep out of the sun. If you cannot avoid being in the sun, wear protective clothing and use sunscreen.Do not use sun lamps or tanning beds/booths. What side effects may I notice from receiving this medication? Side effects that you should report to your doctor or health care professionalas soon as possible: allergic reactions like skin rash, itching or hives, swelling of the face, lips, or tongue low blood counts - this medicine may decrease the number of white blood cells, red blood cells and platelets. You may be at increased risk for infections and bleeding. signs of infection - fever or chills, cough, sore throat, pain or difficulty passing urine signs of decreased platelets or bleeding - bruising, pinpoint red spots on the skin, black,  tarry stools, blood in the urine signs of decreased red blood cells - unusually weak or tired, fainting spells, lightheadedness breathing problems changes in vision chest pain mouth sores nausea and vomiting pain, swelling, redness at site where injected pain, tingling, numbness in the hands or feet redness, swelling, or sores on hands or feet stomach pain unusual bleeding Side effects that usually do not require medical attention (report to yourdoctor or health care professional if they continue or are bothersome): changes in finger or toe nails diarrhea dry or itchy skin hair loss headache loss of appetite sensitivity of eyes to the light stomach upset unusually teary eyes This list may not describe all possible side effects. Call your doctor for medical advice about side effects. You may report side effects to FDA at1-800-FDA-1088. Where should I keep my medication? This drug is given in a hospital or clinic and will not be stored at home. NOTE: This sheet is a summary. It may not cover all possible information. If you have questions about this medicine, talk to your doctor, pharmacist, orhealth care provider.  2022 Elsevier/Gold Standard (2018-11-30 15:00:03)    To help prevent nausea and vomiting after your treatment, we encourage you to take your nausea medication as directed.  BELOW ARE SYMPTOMS THAT SHOULD BE REPORTED IMMEDIATELY: *FEVER GREATER THAN 100.4 F (38 C) OR HIGHER *CHILLS OR SWEATING *NAUSEA AND VOMITING THAT IS NOT CONTROLLED WITH YOUR NAUSEA MEDICATION *UNUSUAL SHORTNESS  OF BREATH *UNUSUAL BRUISING OR BLEEDING *URINARY PROBLEMS (pain or burning when urinating, or frequent urination) *BOWEL PROBLEMS (unusual diarrhea, constipation, pain near the anus) TENDERNESS IN MOUTH AND THROAT WITH OR WITHOUT PRESENCE OF ULCERS (sore throat, sores in mouth, or a toothache) UNUSUAL RASH, SWELLING OR PAIN  UNUSUAL VAGINAL DISCHARGE OR ITCHING   Items with * indicate  a potential emergency and should be followed up as soon as possible or go to the Emergency Department if any problems should occur.  Please show the CHEMOTHERAPY ALERT CARD or IMMUNOTHERAPY ALERT CARD at check-in to the Emergency Department and triage nurse.  Should you have questions after your visit or need to cancel or reschedule your appointment, please contact [facility]  Dept: [phone]  and follow the prompts.  Office hours are 8:00 a.m. to 4:30 p.m. Monday - Friday. Please note that voicemails left after 4:00 p.m. may not be returned until the following business day.  We are closed weekends and major holidays. You have access to a nurse at all times for urgent questions. Please call the main number to the clinic Dept: [phone] and follow the prompts.   For any non-urgent questions, you may also contact your provider using MyChart. We now offer e-Visits for anyone 4 and older to request care online for non-urgent symptoms. For details visit [email or web address].   Also download the MyChart app! Go to the app store, search "MyChart", open the app, select [facility], and log in with your MyChart username and password.  Due to Covid, a mask is required upon entering the hospital/clinic. If you do not have a mask, one will be given to you upon arrival. For doctor visits, patients may have 1 support person aged 64 or older with them. For treatment visits, patients cannot have anyone with them due to current Covid guidelines and our immunocompromised population.

## 2020-08-24 ENCOUNTER — Telehealth: Payer: Self-pay

## 2020-08-24 NOTE — Telephone Encounter (Signed)
Called message left to return call for most recent chest xray results encouraged to call

## 2020-08-24 NOTE — Telephone Encounter (Signed)
-----   Message from Owens Shark, NP sent at 08/24/2020 11:38 AM EDT ----- Please let him know the chest x-ray was negative.  How is his breathing today?

## 2020-08-28 ENCOUNTER — Other Ambulatory Visit (INDEPENDENT_AMBULATORY_CARE_PROVIDER_SITE_OTHER): Payer: Self-pay | Admitting: Primary Care

## 2020-08-28 NOTE — Telephone Encounter (Signed)
   Notes to clinic:  script last filled on 04/02/2020 Looks like medication was refused on 08/15/2020   Requested Prescriptions  Pending Prescriptions Disp Refills   fenofibrate (TRICOR) 145 MG tablet [Pharmacy Med Name: Fenofibrate 145 MG Oral Tablet] 90 tablet 0    Sig: Take 1 tablet by mouth once daily     Cardiovascular:  Antilipid - Fibric Acid Derivatives Failed - 08/28/2020 12:00 PM      Failed - Total Cholesterol in normal range and within 360 days    Cholesterol, Total  Date Value Ref Range Status  08/25/2019 189 100 - 199 mg/dL Final          Failed - LDL in normal range and within 360 days    LDL Chol Calc (NIH)  Date Value Ref Range Status  08/25/2019 97 0 - 99 mg/dL Final          Failed - HDL in normal range and within 360 days    HDL  Date Value Ref Range Status  08/25/2019 63 >39 mg/dL Final          Failed - Triglycerides in normal range and within 360 days    Triglycerides  Date Value Ref Range Status  08/25/2019 168 (H) 0 - 149 mg/dL Final          Failed - Cr in normal range and within 180 days    Creatinine  Date Value Ref Range Status  08/21/2020 1.66 (H) 0.61 - 1.24 mg/dL Final          Failed - AA eGFR in normal range and within 180 days    GFR, Est AFR Am  Date Value Ref Range Status  09/28/2019 >60 >60 mL/min Final   GFR, Estimated  Date Value Ref Range Status  08/21/2020 49 (L) >60 mL/min Final    Comment:    (NOTE) Calculated using the CKD-EPI Creatinine Equation (2021)           Passed - ALT in normal range and within 180 days    ALT  Date Value Ref Range Status  08/21/2020 15 0 - 44 U/L Final          Passed - AST in normal range and within 180 days    AST  Date Value Ref Range Status  08/21/2020 16 15 - 41 U/L Final          Passed - Valid encounter within last 12 months    Recent Outpatient Visits           6 months ago Nonspeaking deaf   Gregory, Michelle P, NP   11 months  ago Nonspeaking deaf   [facility] [name], NP   1 year ago Arthralgia, unspecified joint   [facility] [name], NP   1 year ago Annual physical exam   [facility] [name], NP   2 years ago Severe persistent asthma with acute exacerbation   [facility] Elsie Stain, MD

## 2020-08-31 NOTE — Telephone Encounter (Signed)
Pt was advised by the pharmacy to call office about refill for fenofibrate (TRICOR) 145 MG tablet   Pt stated the request hasnt been responded to / please advise asap

## 2020-09-02 ENCOUNTER — Other Ambulatory Visit: Payer: Self-pay | Admitting: Oncology

## 2020-09-03 NOTE — Telephone Encounter (Signed)
Sent to PCP to refill if appropriate. Last lipid seems to be 08/2019.

## 2020-09-04 ENCOUNTER — Inpatient Hospital Stay: Payer: Medicare Other

## 2020-09-04 ENCOUNTER — Encounter: Payer: Self-pay | Admitting: Nurse Practitioner

## 2020-09-04 ENCOUNTER — Other Ambulatory Visit: Payer: Self-pay | Admitting: Nurse Practitioner

## 2020-09-04 ENCOUNTER — Encounter: Payer: Self-pay | Admitting: *Deleted

## 2020-09-04 ENCOUNTER — Inpatient Hospital Stay (HOSPITAL_BASED_OUTPATIENT_CLINIC_OR_DEPARTMENT_OTHER): Payer: Medicare Other | Admitting: Nurse Practitioner

## 2020-09-04 ENCOUNTER — Other Ambulatory Visit: Payer: Self-pay

## 2020-09-04 VITALS — BP 132/80 | HR 63 | Temp 97.8°F | Resp 18 | Ht 70.0 in | Wt 142.0 lb

## 2020-09-04 DIAGNOSIS — C787 Secondary malignant neoplasm of liver and intrahepatic bile duct: Secondary | ICD-10-CM | POA: Diagnosis not present

## 2020-09-04 DIAGNOSIS — T451X5A Adverse effect of antineoplastic and immunosuppressive drugs, initial encounter: Secondary | ICD-10-CM | POA: Diagnosis not present

## 2020-09-04 DIAGNOSIS — C182 Malignant neoplasm of ascending colon: Secondary | ICD-10-CM | POA: Diagnosis not present

## 2020-09-04 DIAGNOSIS — Z760 Encounter for issue of repeat prescription: Secondary | ICD-10-CM

## 2020-09-04 DIAGNOSIS — D701 Agranulocytosis secondary to cancer chemotherapy: Secondary | ICD-10-CM | POA: Diagnosis not present

## 2020-09-04 DIAGNOSIS — C189 Malignant neoplasm of colon, unspecified: Secondary | ICD-10-CM

## 2020-09-04 DIAGNOSIS — Z5111 Encounter for antineoplastic chemotherapy: Secondary | ICD-10-CM | POA: Diagnosis not present

## 2020-09-04 LAB — CBC WITH DIFFERENTIAL (CANCER CENTER ONLY)
Abs Immature Granulocytes: 1.48 10*3/uL — ABNORMAL HIGH (ref 0.00–0.07)
Basophils Absolute: 0.1 10*3/uL (ref 0.0–0.1)
Basophils Relative: 1 %
Eosinophils Absolute: 0.2 10*3/uL (ref 0.0–0.5)
Eosinophils Relative: 1 %
HCT: 37.7 % — ABNORMAL LOW (ref 39.0–52.0)
Hemoglobin: 11.3 g/dL — ABNORMAL LOW (ref 13.0–17.0)
Immature Granulocytes: 10 %
Lymphocytes Relative: 15 %
Lymphs Abs: 2.2 10*3/uL (ref 0.7–4.0)
MCH: 25.1 pg — ABNORMAL LOW (ref 26.0–34.0)
MCHC: 30 g/dL (ref 30.0–36.0)
MCV: 83.6 fL (ref 80.0–100.0)
Monocytes Absolute: 0.9 10*3/uL (ref 0.1–1.0)
Monocytes Relative: 6 %
Neutro Abs: 9.9 10*3/uL — ABNORMAL HIGH (ref 1.7–7.7)
Neutrophils Relative %: 67 %
Platelet Count: 175 10*3/uL (ref 150–400)
RBC: 4.51 MIL/uL (ref 4.22–5.81)
RDW: 21.2 % — ABNORMAL HIGH (ref 11.5–15.5)
WBC Count: 14.7 10*3/uL — ABNORMAL HIGH (ref 4.0–10.5)
nRBC: 0.6 % — ABNORMAL HIGH (ref 0.0–0.2)

## 2020-09-04 LAB — CMP (CANCER CENTER ONLY)
ALT: 11 U/L (ref 0–44)
AST: 13 U/L — ABNORMAL LOW (ref 15–41)
Albumin: 3.8 g/dL (ref 3.5–5.0)
Alkaline Phosphatase: 112 U/L (ref 38–126)
Anion gap: 7 (ref 5–15)
BUN: 13 mg/dL (ref 6–20)
CO2: 27 mmol/L (ref 22–32)
Calcium: 9.2 mg/dL (ref 8.9–10.3)
Chloride: 108 mmol/L (ref 98–111)
Creatinine: 1.49 mg/dL — ABNORMAL HIGH (ref 0.61–1.24)
GFR, Estimated: 56 mL/min — ABNORMAL LOW (ref >60)
Glucose, Bld: 87 mg/dL (ref 70–99)
Potassium: 4 mmol/L (ref 3.5–5.1)
Sodium: 142 mmol/L (ref 135–145)
Total Bilirubin: 0.3 mg/dL (ref 0.3–1.2)
Total Protein: 6.8 g/dL (ref 6.5–8.1)

## 2020-09-04 MED ORDER — ALBUTEROL SULFATE (2.5 MG/3ML) 0.083% IN NEBU: 2.5000 mg | INHALATION_SOLUTION | Freq: Four times a day (QID) | RESPIRATORY_TRACT | 12 refills | Status: DC | PRN

## 2020-09-04 MED ORDER — SODIUM CHLORIDE 0.9 % IV SOLN
180.0000 mg/m2 | Freq: Once | INTRAVENOUS | Status: AC
  Administered 2020-09-04: 320 mg via INTRAVENOUS
  Filled 2020-09-04: qty 5

## 2020-09-04 MED ORDER — SODIUM CHLORIDE 0.9 % IV SOLN
10.0000 mg | Freq: Once | INTRAVENOUS | Status: AC
  Administered 2020-09-04: 10 mg via INTRAVENOUS
  Filled 2020-09-04: qty 1

## 2020-09-04 MED ORDER — FLUOROURACIL CHEMO INJECTION 2.5 GM/50ML
400.0000 mg/m2 | Freq: Once | INTRAVENOUS | Status: AC
  Administered 2020-09-04: 700 mg via INTRAVENOUS
  Filled 2020-09-04: qty 14

## 2020-09-04 MED ORDER — SODIUM CHLORIDE 0.9 % IV SOLN: Freq: Once | INTRAVENOUS | Status: AC

## 2020-09-04 MED ORDER — ATROPINE SULFATE 1 MG/ML IJ SOLN
0.5000 mg | Freq: Once | INTRAMUSCULAR | Status: AC | PRN
  Administered 2020-09-04: 0.5 mg via INTRAVENOUS
  Filled 2020-09-04: qty 1

## 2020-09-04 MED ORDER — SODIUM CHLORIDE 0.9 % IV SOLN
400.0000 mg/m2 | Freq: Once | INTRAVENOUS | Status: AC
  Administered 2020-09-04: 716 mg via INTRAVENOUS
  Filled 2020-09-04: qty 35.8

## 2020-09-04 MED ORDER — PROCHLORPERAZINE MALEATE 10 MG PO TABS: 10.0000 mg | ORAL_TABLET | Freq: Four times a day (QID) | ORAL | 1 refills | Status: AC | PRN

## 2020-09-04 MED ORDER — SODIUM CHLORIDE 0.9 % IV SOLN
2400.0000 mg/m2 | INTRAVENOUS | Status: DC
  Administered 2020-09-04: 4300 mg via INTRAVENOUS
  Filled 2020-09-04: qty 86

## 2020-09-04 MED ORDER — PALONOSETRON HCL INJECTION 0.25 MG/5ML
0.2500 mg | Freq: Once | INTRAVENOUS | Status: AC
Start: 2020-09-04 — End: 2020-09-04
  Administered 2020-09-04: 0.25 mg via INTRAVENOUS
  Filled 2020-09-04: qty 5

## 2020-09-04 NOTE — Progress Notes (Signed)
[facility] OFFICE PROGRESS NOTE   Diagnosis: Colon cancer  INTERVAL HISTORY:   Alexander Reeves returns as scheduled.  He completed another cycle of FOLFIRI 08/21/2020.  He had a single episode of nausea/vomiting following chemotherapy.  No mouth sores.  No diarrhea.  He notes hypersalivation for 3 to 4 days after each treatment.  Dyspnea is better.  Objective:  Vital signs in last 24 hours:  Blood pressure 132/80, pulse 63, temperature 97.8 F (36.6 C), temperature source Oral, resp. rate 18, height '5\' 10"'  (1.778 m), weight 142 lb (64.4 kg), SpO2 97 %.    HEENT: No thrush or ulcers. Resp: Lungs clear bilaterally. Cardio: Regular rate and rhythm. GI: Abdomen soft and nontender.  No hepatosplenomegaly. Vascular: No leg edema. Skin: Palms with hyperpigmentation. Port-A-Cath without erythema.  Lab Results:  Lab Results  Component Value Date   WBC 14.7 (H) 09/04/2020   HGB 11.3 (L) 09/04/2020   HCT 37.7 (L) 09/04/2020   MCV 83.6 09/04/2020   PLT 175 09/04/2020   NEUTROABS PENDING 09/04/2020    Imaging:  No results found.  Medications: I have reviewed the patient's current medications.  Assessment/Plan: Moderately differentiated adenocarcinoma ascending colon, stage IIIb (pT3pN1), status post a right colectomy 11/19/2018 Lymphovascular and perineural invasion present, 2/14 lymph nodes positive, tumor deposits present Positive radial margin, no loss of mismatch repair protein expression, discussed case with Dr. Craig Staggers mass with surrounding inflammation at multiple margins, no gross residual disease, unclear which is the "positive "radial margin, further surgery and radiation not recommended Colonoscopy 11/19/2018-completely obstructing mid ascending colon mass, could not be passed with endoscope, biopsy confirmed invasive adenocarcinoma CT abdomen/pelvis 11/18/2018-wall thickening at the mid and distal ascending colon with mild distention of the proximal  ascending colon and cecum CTs 10/17/2018--no acute findings, no chest lymphadenopathy, lungs clear Cycle 1 FOLFOX 12/28/2018 Cycle 2 FOLFOX 01/11/2019, Udenyca added  Cycle 3 FOLFOX 01/25/2019, Udenyca held due to bone pain Cycle 4 FOLFOX 02/21/2019, Udenyca added Cycle 5 FOLFOX 03/09/2019, Udenyca Cycle 6 FOLFOX 03/21/2019, Udenyca Cycle 7 FOLFOX 04/04/2019, Udenyca Cycle 8 FOLFOX 04/18/2019, Udenyca  Cycle 9 FOLFOX 05/02/2019, Udenyca Cycle 10 FOLFOX 05/16/2019, oxaliplatin, 5-FU bolus, and Udenyca held Cycle 11 FOLFOX 05/30/2019, oxaliplatin, 5-FU bolus, and Udenyca held Cycle 12 FOLFOX 06/15/2019, oxaliplatin, 5-FU bolus and Udenyca held CTs 09/29/2019-new hypodense enhancing liver masses consistent with metastatic disease, indistinct marginated nodularity below the pancreas head-likely small lymph nodes Biopsy liver lesion 10/18/2019-adenocarcinoma consistent with history of colorectal carcinoma Cycle 1 FOLFIRI/Avastin 11/01/2019 Cycle 2 FOLFIRI/Avastin 11/15/2019 Cycle 3 FOLFIRI/Avastin 11/29/2019 Cycle 4 FOLFIRI/Avastin 12/13/2019 CT chest 12/27/2019-no significant change in hepatic metastases, submassive pulmonary emboli Cycle 5 FOLFIRI 01/10/2020 (Avastin discontinued) Cycle 6 FOLFIRI 02/01/2020 Cycle 7 FOLFIRI 02/13/2020 Cycle 8 FOLFIRI 02/27/2020 CTs abdomen/pelvis 03/09/2020-per Dr. Gearldine Shown review overall stable disease Cycle 9 FOLFIRI 04/16/2020 Cycle 10 FOLFIRI 05/01/2020 Cycle 11 FOLFIRI 05/15/2020 Cycle 12 FOLFIRI 05/29/2020 CT abdomen/pelvis 06/07/2020- decreased size of liver lesions, no new lesions Cycle 13 FOLFIRI 06/12/2020 Cycle 14 FOLFIRI 07/10/2020 Cycle 15 FOLFIRI 07/24/2020 Cycle 16 FOLFIRI 08/07/2020 Cycle 17 FOLFIRI 08/21/2020 Cycle 18 FOLFIRI 09/04/2020   Deaf Right epididymal cyst removal 03/22/2018 Asthma Port-A-Cath placement, [name], 12/23/2018 Neutropenia secondary to chemotherapy-Udenyca added for cycle 2 FOLFOX Admission with febrile neutropenia 02/08/2019 Hospital  admission with submassive PE and left lower extremity DVT on 12/27/2019, heparin, transition to Xarelto 12/28/2019 CT chest 12/27/2019-submassive pulmonary emboli with evidence of right heart strain, no significant change in hepatic metastases    Disposition: Alexander Reeves  appears unchanged.  He has completed 17 cycles of FOLFIRI.  Plan to proceed with cycle 18 today as scheduled.  Restaging CTs prior to next visit.  CBC is pending.  Chemistry panel reviewed, adequate to proceed with treatment.  Creatinine remains elevated, improved as compared to 2 weeks ago, stable over time.  He will have a repeat basic metabolic panel prior to CT scan on 09/14/2020.  He will return for follow-up in 2 weeks.  We are available to see him sooner if needed.  Plan reviewed with Dr. Benay Spice.    Ned Card ANP/GNP-BC   09/04/2020  10:32 AM

## 2020-09-04 NOTE — Progress Notes (Signed)
Provided patient case of vanilla Ensure today.

## 2020-09-04 NOTE — Patient Instructions (Signed)
Alexander Reeves   Discharge Instructions: Thank you for choosing [facility] to provide your oncology and hematology care.   If you have a lab appointment with the [facility], please go directly to the [facility] and check in at the registration area.   Wear comfortable clothing and clothing appropriate for easy access to any Portacath or PICC line.   We strive to give you quality time with your provider. You may need to reschedule your appointment if you arrive late (15 or more minutes).  Arriving late affects you and other patients whose appointments are after yours.  Also, if you miss three or more appointments without notifying the office, you may be dismissed from the clinic at the provider's discretion.      For prescription refill requests, have your pharmacy contact our office and allow 72 hours for refills to be completed.    Today you received the following chemotherapy and/or immunotherapy agents Irinotecan (CAMPTOSAR), Leucovorin & Flourouracil (ADRUCIL).      To help prevent nausea and vomiting after your treatment, we encourage you to take your nausea medication as directed.  BELOW ARE SYMPTOMS THAT SHOULD BE REPORTED IMMEDIATELY: *FEVER GREATER THAN 100.4 F (38 C) OR HIGHER *CHILLS OR SWEATING *NAUSEA AND VOMITING THAT IS NOT CONTROLLED WITH YOUR NAUSEA MEDICATION *UNUSUAL SHORTNESS OF BREATH *UNUSUAL BRUISING OR BLEEDING *URINARY PROBLEMS (pain or burning when urinating, or frequent urination) *BOWEL PROBLEMS (unusual diarrhea, constipation, pain near the anus) TENDERNESS IN MOUTH AND THROAT WITH OR WITHOUT PRESENCE OF ULCERS (sore throat, sores in mouth, or a toothache) UNUSUAL RASH, SWELLING OR PAIN  UNUSUAL VAGINAL DISCHARGE OR ITCHING   Items with * indicate a potential emergency and should be followed up as soon as possible or go to the Emergency Department if any problems should occur.  Please show the CHEMOTHERAPY ALERT  CARD or IMMUNOTHERAPY ALERT CARD at check-in to the Emergency Department and triage nurse.  Should you have questions after your visit or need to cancel or reschedule your appointment, please contact [facility]  Dept: [phone]  and follow the prompts.  Office hours are 8:00 a.m. to 4:30 p.m. Monday - Friday. Please note that voicemails left after 4:00 p.m. may not be returned until the following business day.  We are closed weekends and major holidays. You have access to a nurse at all times for urgent questions. Please call the main number to the clinic Dept: [phone] and follow the prompts.   For any non-urgent questions, you may also contact your provider using MyChart. We now offer e-Visits for anyone 72 and older to request care online for non-urgent symptoms. For details visit [email or web address].   Also download the MyChart app! Go to the app store, search "MyChart", open the app, select [facility], and log in with your MyChart username and password.  Due to Covid, a mask is required upon entering the hospital/clinic. If you do not have a mask, one will be given to you upon arrival. For doctor visits, patients may have 1 support person aged 33 or older with them. For treatment visits, patients cannot have anyone with them due to current Covid guidelines and our immunocompromised population.   Irinotecan injection What is this medication? IRINOTECAN (ir in oh TEE kan ) is a chemotherapy drug. It is used to treatcolon and rectal cancer. This medicine may be used for other purposes; ask your health care provider orpharmacist if you have questions.  COMMON BRAND NAME(S): Camptosar What should I tell my care team before I take this medication? They need to know if you have any of these conditions: dehydration diarrhea infection (especially a virus infection such as chickenpox, cold sores, or herpes) liver disease low blood counts, like low white cell,  platelet, or red cell counts low levels of calcium, magnesium, or potassium in the blood recent or ongoing radiation therapy an unusual or allergic reaction to irinotecan, other medicines, foods, dyes, or preservatives pregnant or trying to get pregnant breast-feeding How should I use this medication? This drug is given as an infusion into a vein. It is administered in a hospitalor clinic by a specially trained health care professional. Talk to your pediatrician regarding the use of this medicine in children.Special care may be needed. Overdosage: If you think you have taken too much of this medicine contact apoison control center or emergency room at once. NOTE: This medicine is only for you. Do not share this medicine with others. What if I miss a dose? It is important not to miss your dose. Call your doctor or health careprofessional if you are unable to keep an appointment. What may interact with this medication? Do not take this medicine with any of the following medications: cobicistat itraconazole This medicine may interact with the following medications: antiviral medicines for HIV or AIDS certain antibiotics like rifampin or rifabutin certain medicines for fungal infections like ketoconazole, posaconazole, and voriconazole certain medicines for seizures like carbamazepine, phenobarbital, phenotoin clarithromycin gemfibrozil nefazodone [facility]. John's Wort This list may not describe all possible interactions. Give your health care provider a list of all the medicines, herbs, non-prescription drugs, or dietary supplements you use. Also tell them if you smoke, drink alcohol, or use illegaldrugs. Some items may interact with your medicine. What should I watch for while using this medication? Your condition will be monitored carefully while you are receiving this medicine. You will need important blood work done while you are taking thismedicine. This drug may make you feel generally  unwell. This is not uncommon, as chemotherapy can affect healthy cells as well as cancer cells. Report any side effects. Continue your course of treatment even though you feel ill unless yourdoctor tells you to stop. In some cases, you may be given additional medicines to help with side effects.Follow all directions for their use. You may get drowsy or dizzy. Do not drive, use machinery, or do anything that needs mental alertness until you know how this medicine affects you. Do not stand or sit up quickly, especially if you are an older patient. This reducesthe risk of dizzy or fainting spells. Call your health care professional for advice if you get a fever, chills, or sore throat, or other symptoms of a cold or flu. Do not treat yourself. This medicine decreases your body's ability to fight infections. Try to avoid beingaround people who are sick. Avoid taking products that contain aspirin, acetaminophen, ibuprofen, naproxen, or ketoprofen unless instructed by your doctor. These medicines may hide afever. This medicine may increase your risk to bruise or bleed. Call your doctor orhealth care professional if you notice any unusual bleeding. Be careful brushing and flossing your teeth or using a toothpick because you may get an infection or bleed more easily. If you have any dental work done,tell your dentist you are receiving this medicine. Do not become pregnant while taking this medicine or for 6 months after stopping it. Women should inform their health care professional if they wish  to become pregnant or think they might be pregnant. Men should not father a child while taking this medicine and for 3 months after stopping it. There is potential for serious side effects to an unborn child. Talk to your health careprofessional for more information. Do not breast-feed an infant while taking this medicine or for 7 days afterstopping it. This medicine has caused ovarian failure in some women. This medicine  may make it more difficult to get pregnant. Talk to your health care professional if [place] concerned about your fertility. This medicine has caused decreased sperm counts in some men. This may make it more difficult to father a child. Talk to your health care professional if [place] concerned about your fertility. What side effects may I notice from receiving this medication? Side effects that you should report to your doctor or health care professionalas soon as possible: allergic reactions like skin rash, itching or hives, swelling of the face, lips, or tongue chest pain diarrhea flushing, runny nose, sweating during infusion low blood counts - this medicine may decrease the number of white blood cells, red blood cells and platelets. You may be at increased risk for infections and bleeding. nausea, vomiting pain, swelling, warmth in the leg signs of decreased platelets or bleeding - bruising, pinpoint red spots on the skin, black, tarry stools, blood in the urine signs of infection - fever or chills, cough, sore throat, pain or difficulty passing urine signs of decreased red blood cells - unusually weak or tired, fainting spells, lightheadedness Side effects that usually do not require medical attention (report to yourdoctor or health care professional if they continue or are bothersome): constipation hair loss headache loss of appetite mouth sores stomach pain This list may not describe all possible side effects. Call your doctor for medical advice about side effects. You may report side effects to FDA at1-800-FDA-1088. Where should I keep my medication? This drug is given in a hospital or clinic and will not be stored at home. NOTE: This sheet is a summary. It may not cover all possible information. If you have questions about this medicine, talk to your doctor, pharmacist, orhealth care provider.  2022 Elsevier/Gold Standard (2018-11-30 17:46:13)  Leucovorin injection What is this  medication? LEUCOVORIN (loo koe VOR in) is used to prevent or treat the harmful effects of some medicines. This medicine is used to treat anemia caused by a low amount of folic acid in the body. It is also used with 5-fluorouracil (5-FU) to treatcolon cancer. This medicine may be used for other purposes; ask your health care provider orpharmacist if you have questions. What should I tell my care team before I take this medication? They need to know if you have any of these conditions: anemia from low levels of vitamin B-12 in the blood an unusual or allergic reaction to leucovorin, folic acid, other medicines, foods, dyes, or preservatives pregnant or trying to get pregnant breast-feeding How should I use this medication? This medicine is for injection into a muscle or into a vein. It is given by ahealth care professional in a hospital or clinic setting. Talk to your pediatrician regarding the use of this medicine in children.Special care may be needed. Overdosage: If you think you have taken too much of this medicine contact apoison control center or emergency room at once. NOTE: This medicine is only for you. Do not share this medicine with others. What if I miss a dose? This does not apply. What may interact with this medication?  capecitabine fluorouracil phenobarbital phenytoin primidone trimethoprim-sulfamethoxazole This list may not describe all possible interactions. Give your health care provider a list of all the medicines, herbs, non-prescription drugs, or dietary supplements you use. Also tell them if you smoke, drink alcohol, or use illegaldrugs. Some items may interact with your medicine. What should I watch for while using this medication? Your condition will be monitored carefully while you are receiving thismedicine. This medicine may increase the side effects of 5-fluorouracil, 5-FU. Tell your doctor or health care professional if you have diarrhea or mouth sores that donot  get better or that get worse. What side effects may I notice from receiving this medication? Side effects that you should report to your doctor or health care professionalas soon as possible: allergic reactions like skin rash, itching or hives, swelling of the face, lips, or tongue breathing problems fever, infection mouth sores unusual bleeding or bruising unusually weak or tired Side effects that usually do not require medical attention (report to yourdoctor or health care professional if they continue or are bothersome): constipation or diarrhea loss of appetite nausea, vomiting This list may not describe all possible side effects. Call your doctor for medical advice about side effects. You may report side effects to FDA at1-800-FDA-1088. Where should I keep my medication? This drug is given in a hospital or clinic and will not be stored at home. NOTE: This sheet is a summary. It may not cover all possible information. If you have questions about this medicine, talk to your doctor, pharmacist, orhealth care provider.  2022 Elsevier/Gold Standard (2007-07-06 16:50:29)  Fluorouracil, 5-FU injection What is this medication? FLUOROURACIL, 5-FU (flure oh YOOR a sil) is a chemotherapy drug. It slows the growth of cancer cells. This medicine is used to treat many types of cancer like breast cancer, colon or rectal cancer, pancreatic cancer, and stomachcancer. This medicine may be used for other purposes; ask your health care provider orpharmacist if you have questions. COMMON BRAND NAME(S): Adrucil What should I tell my care team before I take this medication? They need to know if you have any of these conditions: blood disorders dihydropyrimidine dehydrogenase (DPD) deficiency infection (especially a virus infection such as chickenpox, cold sores, or herpes) kidney disease liver disease malnourished, poor nutrition recent or ongoing radiation therapy an unusual or allergic reaction to  fluorouracil, other chemotherapy, other medicines, foods, dyes, or preservatives pregnant or trying to get pregnant breast-feeding How should I use this medication? This drug is given as an infusion or injection into a vein. It is administeredin a hospital or clinic by a specially trained health care professional. Talk to your pediatrician regarding the use of this medicine in children.Special care may be needed. Overdosage: If you think you have taken too much of this medicine contact apoison control center or emergency room at once. NOTE: This medicine is only for you. Do not share this medicine with others. What if I miss a dose? It is important not to miss your dose. Call your doctor or health careprofessional if you are unable to keep an appointment. What may interact with this medication? Do not take this medicine with any of the following medications: live virus vaccines This medicine may also interact with the following medications: medicines that treat or prevent blood clots like warfarin, enoxaparin, and dalteparin This list may not describe all possible interactions. Give your health care provider a list of all the medicines, herbs, non-prescription drugs, or dietary supplements you use. Also tell them if you  smoke, drink alcohol, or use illegaldrugs. Some items may interact with your medicine. What should I watch for while using this medication? Visit your doctor for checks on your progress. This drug may make you feel generally unwell. This is not uncommon, as chemotherapy can affect healthy cells as well as cancer cells. Report any side effects. Continue your course oftreatment even though you feel ill unless your doctor tells you to stop. In some cases, you may be given additional medicines to help with side effects.Follow all directions for their use. Call your doctor or health care professional for advice if you get a fever, chills or sore throat, or other symptoms of a cold or  flu. Do not treat yourself. This drug decreases your body's ability to fight infections. Try toavoid being around people who are sick. This medicine may increase your risk to bruise or bleed. Call your doctor orhealth care professional if you notice any unusual bleeding. Be careful brushing and flossing your teeth or using a toothpick because you may get an infection or bleed more easily. If you have any dental work done,tell your dentist you are receiving this medicine. Avoid taking products that contain aspirin, acetaminophen, ibuprofen, naproxen, or ketoprofen unless instructed by your doctor. These medicines may hide afever. Do not become pregnant while taking this medicine. Women should inform their doctor if they wish to become pregnant or think they might be pregnant. There is a potential for serious side effects to an unborn child. Talk to your health care professional or pharmacist for more information. Do not breast-feed aninfant while taking this medicine. Men should inform their doctor if they wish to father a child. This medicinemay lower sperm counts. Do not treat diarrhea with over the counter products. Contact your doctor ifyou have diarrhea that lasts more than 2 days or if it is severe and watery. This medicine can make you more sensitive to the sun. Keep out of the sun. If you cannot avoid being in the sun, wear protective clothing and use sunscreen.Do not use sun lamps or tanning beds/booths. What side effects may I notice from receiving this medication? Side effects that you should report to your doctor or health care professionalas soon as possible: allergic reactions like skin rash, itching or hives, swelling of the face, lips, or tongue low blood counts - this medicine may decrease the number of white blood cells, red blood cells and platelets. You may be at increased risk for infections and bleeding. signs of infection - fever or chills, cough, sore throat, pain or difficulty  passing urine signs of decreased platelets or bleeding - bruising, pinpoint red spots on the skin, black, tarry stools, blood in the urine signs of decreased red blood cells - unusually weak or tired, fainting spells, lightheadedness breathing problems changes in vision chest pain mouth sores nausea and vomiting pain, swelling, redness at site where injected pain, tingling, numbness in the hands or feet redness, swelling, or sores on hands or feet stomach pain unusual bleeding Side effects that usually do not require medical attention (report to yourdoctor or health care professional if they continue or are bothersome): changes in finger or toe nails diarrhea dry or itchy skin hair loss headache loss of appetite sensitivity of eyes to the light stomach upset unusually teary eyes This list may not describe all possible side effects. Call your doctor for medical advice about side effects. You may report side effects to FDA at1-800-FDA-1088. Where should I keep my medication? This drug is  given in a hospital or clinic and will not be stored at home. NOTE: This sheet is a summary. It may not cover all possible information. If you have questions about this medicine, talk to your doctor, pharmacist, orhealth care provider.  2022 Elsevier/Gold Standard (2018-11-30 15:00:03)  The chemotherapy medication bag should finish at 46 hours, 96 hours, or 7 days. For example, if your pump is scheduled for 46 hours and it was put on at 4:00 p.m., it should finish at 2:00 p.m. the day it is scheduled to come off regardless of your appointment time.     Estimated time to finish at 1:00 p.m. on Thursday 09/06/2020.   If the display on your pump reads "Low Volume" and it is beeping, take the batteries out of the pump and come to the cancer center for it to be taken off.   If the pump alarms go off prior to the pump reading "Low Volume" then call [phone] and someone can assist you.  If the  plunger comes out and the chemotherapy medication is leaking out, please use your home chemo spill kit to clean up the spill. Do NOT use paper towels or other household products.  If you have problems or questions regarding your pump, please call either [phone] (24 hours a day) or the cancer center Monday-Friday 8:00 a.m.- 4:30 p.m. at the clinic number and we will assist you. If you are unable to get assistance, then go to the nearest Emergency Department and ask the staff to contact the IV team for assistance.

## 2020-09-06 ENCOUNTER — Other Ambulatory Visit: Payer: Self-pay

## 2020-09-06 ENCOUNTER — Inpatient Hospital Stay: Payer: Medicare Other

## 2020-09-06 VITALS — BP 124/78 | HR 64 | Temp 97.8°F | Resp 20

## 2020-09-06 DIAGNOSIS — Z5111 Encounter for antineoplastic chemotherapy: Secondary | ICD-10-CM | POA: Diagnosis not present

## 2020-09-06 DIAGNOSIS — C182 Malignant neoplasm of ascending colon: Secondary | ICD-10-CM | POA: Diagnosis not present

## 2020-09-06 DIAGNOSIS — D701 Agranulocytosis secondary to cancer chemotherapy: Secondary | ICD-10-CM | POA: Diagnosis not present

## 2020-09-06 DIAGNOSIS — C189 Malignant neoplasm of colon, unspecified: Secondary | ICD-10-CM

## 2020-09-06 DIAGNOSIS — C787 Secondary malignant neoplasm of liver and intrahepatic bile duct: Secondary | ICD-10-CM | POA: Diagnosis not present

## 2020-09-06 DIAGNOSIS — T451X5A Adverse effect of antineoplastic and immunosuppressive drugs, initial encounter: Secondary | ICD-10-CM | POA: Diagnosis not present

## 2020-09-06 MED ORDER — PEGFILGRASTIM-CBQV 6 MG/0.6ML ~~LOC~~ SOSY
6.0000 mg | PREFILLED_SYRINGE | Freq: Once | SUBCUTANEOUS | Status: AC
  Administered 2020-09-06: 6 mg via SUBCUTANEOUS

## 2020-09-06 MED ORDER — HEPARIN SOD (PORK) LOCK FLUSH 100 UNIT/ML IV SOLN
500.0000 [IU] | Freq: Once | INTRAVENOUS | Status: AC | PRN
  Administered 2020-09-06: 500 [IU]

## 2020-09-06 MED ORDER — SODIUM CHLORIDE 0.9% FLUSH
10.0000 mL | INTRAVENOUS | Status: DC | PRN
  Administered 2020-09-06: 10 mL

## 2020-09-06 NOTE — Patient Instructions (Signed)

## 2020-09-07 ENCOUNTER — Other Ambulatory Visit (INDEPENDENT_AMBULATORY_CARE_PROVIDER_SITE_OTHER): Payer: Self-pay | Admitting: Primary Care

## 2020-09-13 ENCOUNTER — Encounter (INDEPENDENT_AMBULATORY_CARE_PROVIDER_SITE_OTHER): Payer: Medicare Other | Admitting: Primary Care

## 2020-09-13 ENCOUNTER — Encounter (INDEPENDENT_AMBULATORY_CARE_PROVIDER_SITE_OTHER): Payer: Self-pay

## 2020-09-14 ENCOUNTER — Ambulatory Visit (HOSPITAL_BASED_OUTPATIENT_CLINIC_OR_DEPARTMENT_OTHER)
Admission: RE | Admit: 2020-09-14 | Discharge: 2020-09-14 | Disposition: A | Discharge status: Home / Self Care | Payer: Medicare Other | Source: Ambulatory Visit | Attending: Nurse Practitioner | Admitting: Nurse Practitioner

## 2020-09-14 ENCOUNTER — Other Ambulatory Visit: Payer: Self-pay

## 2020-09-14 ENCOUNTER — Inpatient Hospital Stay: Payer: Medicare Other

## 2020-09-14 ENCOUNTER — Inpatient Hospital Stay: Payer: Medicare Other | Attending: Nurse Practitioner

## 2020-09-14 ENCOUNTER — Other Ambulatory Visit: Payer: Self-pay | Admitting: Nurse Practitioner

## 2020-09-14 ENCOUNTER — Encounter (HOSPITAL_BASED_OUTPATIENT_CLINIC_OR_DEPARTMENT_OTHER): Payer: Self-pay

## 2020-09-14 VITALS — BP 114/74 | HR 89 | Temp 98.4°F | Resp 18

## 2020-09-14 DIAGNOSIS — D701 Agranulocytosis secondary to cancer chemotherapy: Secondary | ICD-10-CM | POA: Insufficient documentation

## 2020-09-14 DIAGNOSIS — C182 Malignant neoplasm of ascending colon: Secondary | ICD-10-CM | POA: Insufficient documentation

## 2020-09-14 DIAGNOSIS — Z85038 Personal history of other malignant neoplasm of large intestine: Secondary | ICD-10-CM | POA: Diagnosis not present

## 2020-09-14 DIAGNOSIS — C779 Secondary and unspecified malignant neoplasm of lymph node, unspecified: Secondary | ICD-10-CM | POA: Insufficient documentation

## 2020-09-14 DIAGNOSIS — C787 Secondary malignant neoplasm of liver and intrahepatic bile duct: Secondary | ICD-10-CM | POA: Insufficient documentation

## 2020-09-14 DIAGNOSIS — Z5111 Encounter for antineoplastic chemotherapy: Secondary | ICD-10-CM | POA: Insufficient documentation

## 2020-09-14 DIAGNOSIS — Z95828 Presence of other vascular implants and grafts: Secondary | ICD-10-CM

## 2020-09-14 LAB — BASIC METABOLIC PANEL - CANCER CENTER ONLY
Anion gap: 8 (ref 5–15)
BUN: 11 mg/dL (ref 6–20)
CO2: 27 mmol/L (ref 22–32)
Calcium: 9.1 mg/dL (ref 8.9–10.3)
Chloride: 104 mmol/L (ref 98–111)
Creatinine: 1.28 mg/dL — ABNORMAL HIGH (ref 0.61–1.24)
GFR, Estimated: >60 mL/min (ref >60)
Glucose, Bld: 93 mg/dL (ref 70–99)
Potassium: 3.5 mmol/L (ref 3.5–5.1)
Sodium: 139 mmol/L (ref 135–145)

## 2020-09-14 IMAGING — CT CT ABD-PELV W/ CM
2 of 5 series · 16 of 46 positions shown, 18 images · IV contrast (APPLIED)
Comparison: CT abdomen pelvis [DATE].

CLINICAL DATA: History of colon cancer.  Restaging exam.

EXAM:
CT ABDOMEN AND PELVIS WITH CONTRAST
TECHNIQUE: Multidetector CT imaging of the abdomen and pelvis was performed
using the standard protocol following bolus administration of
intravenous contrast.
CONTRAST:  60mL OMNIPAQUE IOHEXOL 350 MG/ML SOLN

[Series 2: abd pel w · axial · 0.72mm/px · z∈[+956,+1350]mm · 13 of 89 slices shown, 15 images]
[im 5/89  soft-tissue]
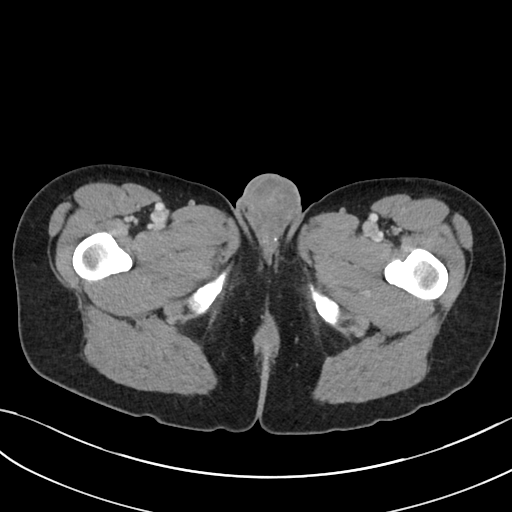
[im 5/89  bone]
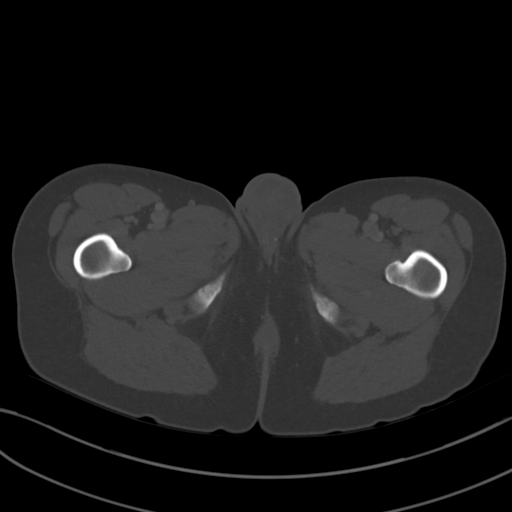
[im 10/89  soft-tissue]
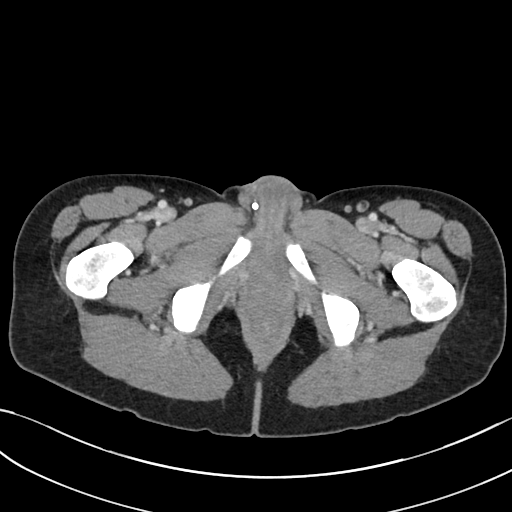
[im 20/89  soft-tissue]
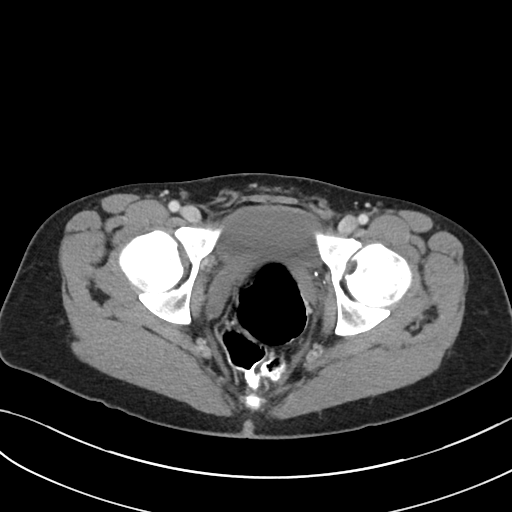
[im 25/89  soft-tissue]
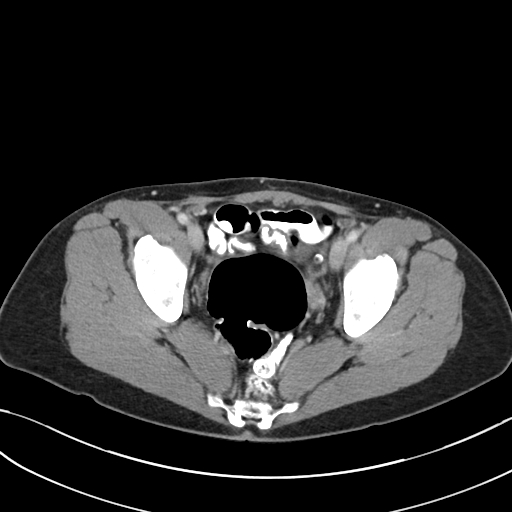
[im 30/89  soft-tissue]
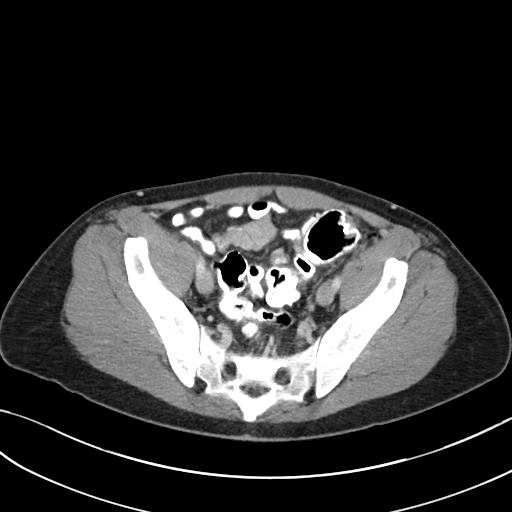
[im 40/89  soft-tissue]
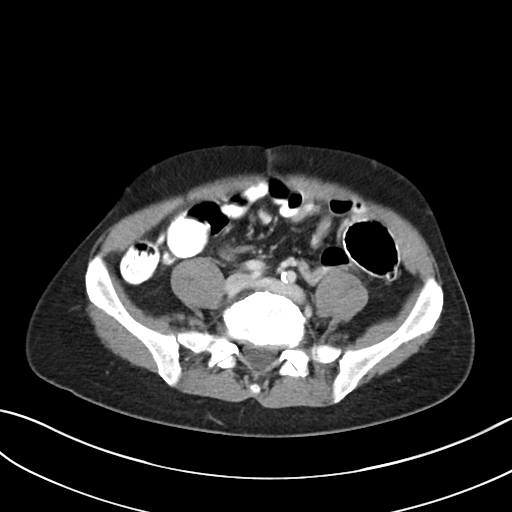
[im 45/89  soft-tissue]
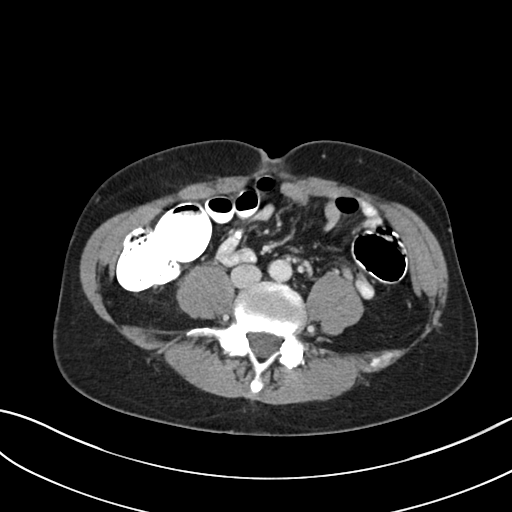
[im 49/89  soft-tissue]
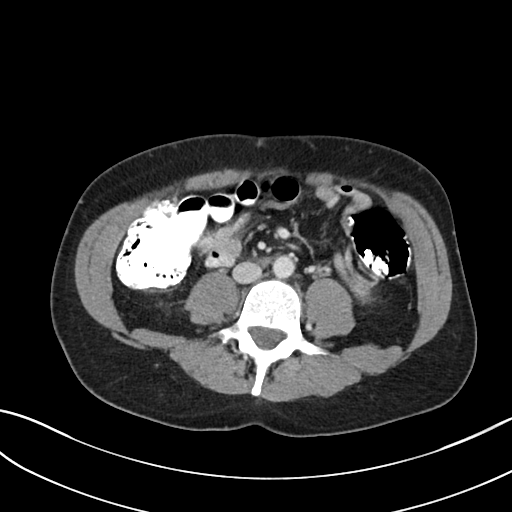
[im 59/89  soft-tissue]
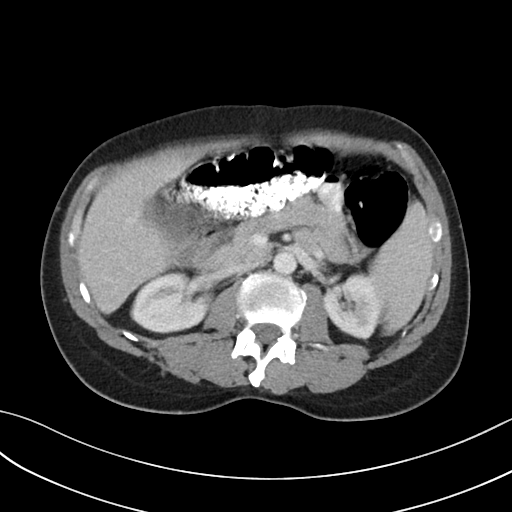
[im 59/89  bone]
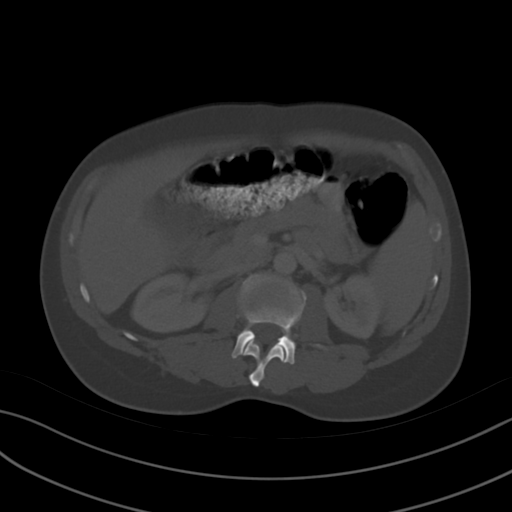
[im 64/89  soft-tissue]
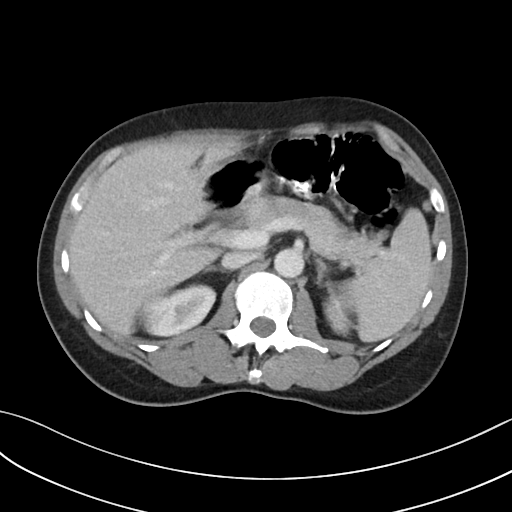
[im 69/89  soft-tissue]
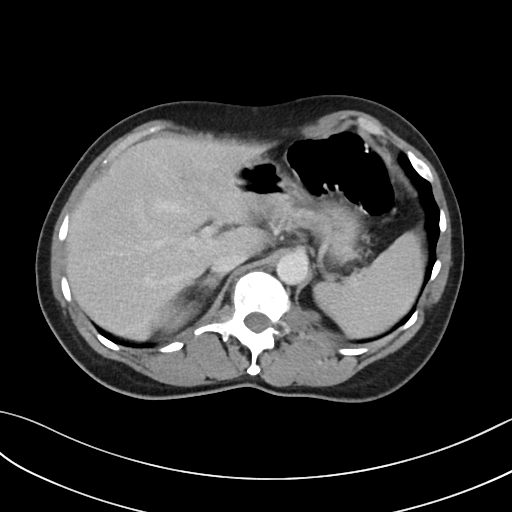
[im 79/89  soft-tissue]
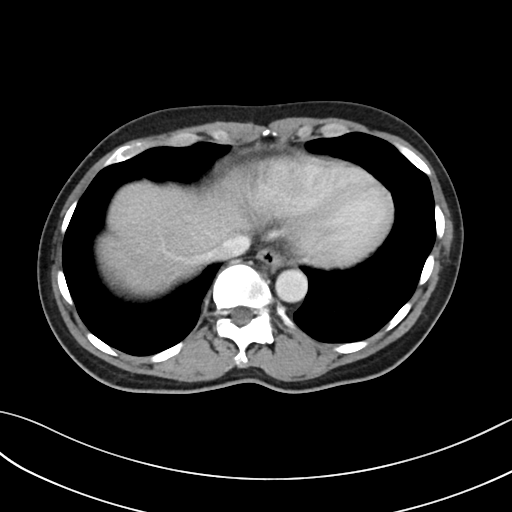
[im 84/89  soft-tissue]
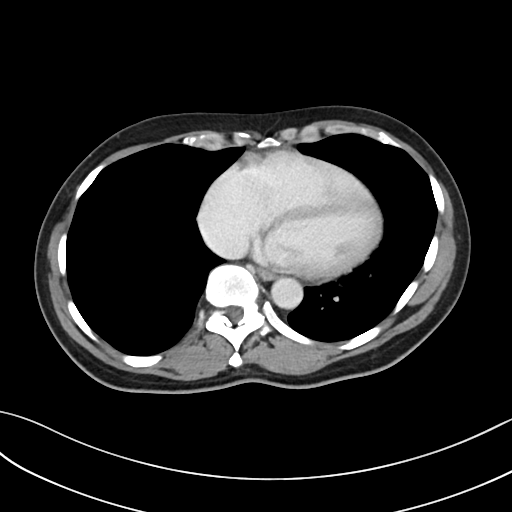

[Series 5: coronal · coronal · 0.70mm/px · 3 of 82 slices shown]
[im 28/82  soft-tissue]
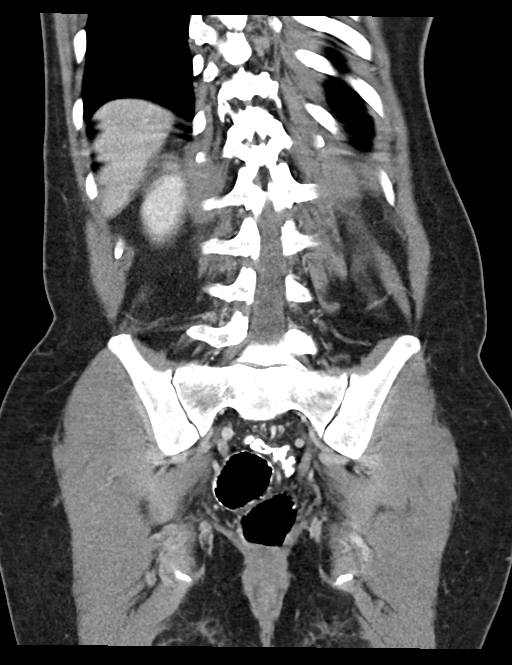
[im 37/82  soft-tissue]
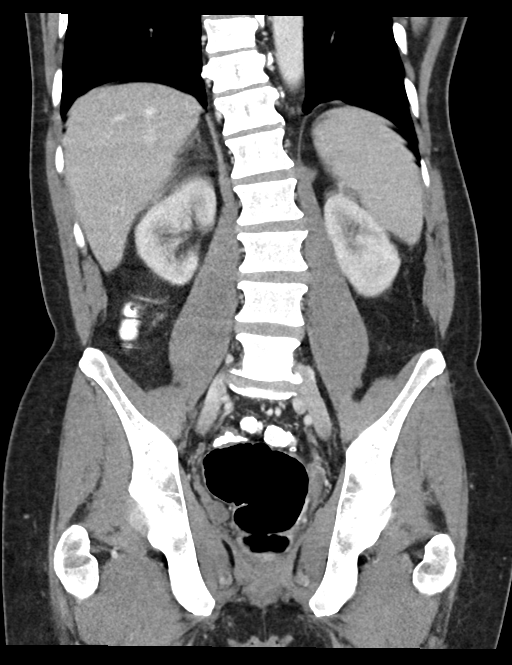
[im 46/82  soft-tissue]
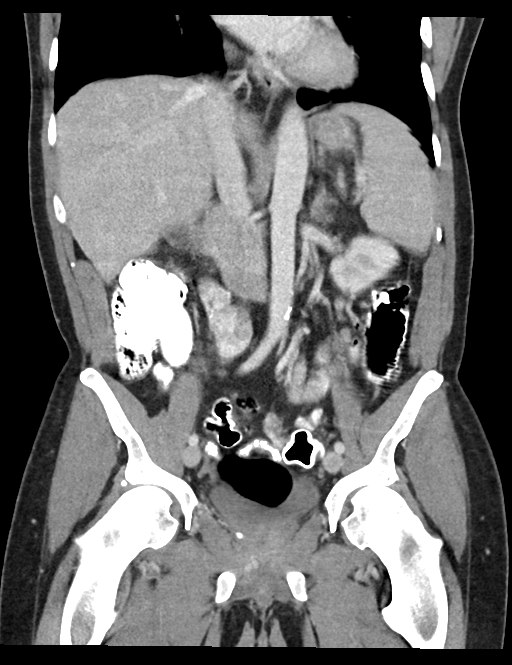

[16 of 46 positions shown; findings below may reference images not displayed]

FINDINGS: Lower chest: Normal heart size. Dependent atelectasis left lower
lobe. No pleural effusion.

Hepatobiliary: Unchanged 8 mm high right hepatic lobe lesion (image
12; series 2). Right hepatic lobe lesion measures 9 mm (image 19;
series 2), previously 10 mm. Inferior right hepatic lobe lesion
measures 9 mm (image 33; series 2), previously 9 mm. No new or
enlarging hepatic lesions are identified. No intrahepatic or
extrahepatic biliary ductal dilatation.

Pancreas: Unremarkable

Spleen: Unremarkable

Adrenals/Urinary Tract: Normal adrenal glands. Kidneys enhance
symmetrically with contrast. No hydronephrosis. Urinary bladder is
unremarkable.

Stomach/Bowel: Normal morphology of the stomach. Prior partial
ascending colectomy. Normal small bowel.

Vascular/Lymphatic: Normal caliber abdominal aorta. No
retroperitoneal lymphadenopathy.

Reproductive: Heterogeneous prostate.

Other: None.

Musculoskeletal: No aggressive or acute appearing osseous lesions.
IMPRESSION: Similar faint hepatic metastasis.

No new sites of disease.

Limited evaluation due to motion artifact.

## 2020-09-14 MED ORDER — SODIUM CHLORIDE 0.9% FLUSH
10.0000 mL | INTRAVENOUS | Status: DC | PRN
  Administered 2020-09-14: 10 mL

## 2020-09-14 MED ORDER — HEPARIN SOD (PORK) LOCK FLUSH 100 UNIT/ML IV SOLN
500.0000 [IU] | Freq: Once | INTRAVENOUS | Status: AC
  Administered 2020-09-14: 500 [IU] via INTRAVENOUS

## 2020-09-14 MED ORDER — IOHEXOL 350 MG/ML SOLN
60.0000 mL | Freq: Once | INTRAVENOUS | Status: AC | PRN
  Administered 2020-09-14: 60 mL via INTRAVENOUS

## 2020-09-14 MED ORDER — HEPARIN SOD (PORK) LOCK FLUSH 100 UNIT/ML IV SOLN: 500.0000 [IU] | Freq: Once | INTRAVENOUS | Status: DC | PRN

## 2020-09-14 MED ORDER — ALBUTEROL SULFATE (2.5 MG/3ML) 0.083% IN NEBU: 2.5000 mg | INHALATION_SOLUTION | Freq: Four times a day (QID) | RESPIRATORY_TRACT | 12 refills | Status: DC | PRN

## 2020-09-14 NOTE — Telephone Encounter (Signed)
Pharmacy not able to fill his albuterol due to colon cancer listed as diagnosis. Re-sent script w/bronchospasm/asthma as diagnosis code.

## 2020-09-17 ENCOUNTER — Other Ambulatory Visit: Payer: Self-pay | Admitting: Oncology

## 2020-09-17 NOTE — Patient Instructions (Signed)
Came to appt at 4:45 was not seen

## 2020-09-18 ENCOUNTER — Other Ambulatory Visit: Payer: Self-pay

## 2020-09-18 ENCOUNTER — Inpatient Hospital Stay: Payer: Medicare Other

## 2020-09-18 ENCOUNTER — Inpatient Hospital Stay (HOSPITAL_BASED_OUTPATIENT_CLINIC_OR_DEPARTMENT_OTHER): Payer: Medicare Other | Admitting: Oncology

## 2020-09-18 VITALS — BP 150/86 | HR 92 | Temp 98.2°F | Resp 18 | Ht 70.0 in | Wt 140.0 lb

## 2020-09-18 DIAGNOSIS — C779 Secondary and unspecified malignant neoplasm of lymph node, unspecified: Secondary | ICD-10-CM | POA: Diagnosis not present

## 2020-09-18 DIAGNOSIS — C787 Secondary malignant neoplasm of liver and intrahepatic bile duct: Secondary | ICD-10-CM | POA: Diagnosis not present

## 2020-09-18 DIAGNOSIS — C182 Malignant neoplasm of ascending colon: Secondary | ICD-10-CM

## 2020-09-18 DIAGNOSIS — C189 Malignant neoplasm of colon, unspecified: Secondary | ICD-10-CM

## 2020-09-18 DIAGNOSIS — Z5111 Encounter for antineoplastic chemotherapy: Secondary | ICD-10-CM | POA: Diagnosis not present

## 2020-09-18 DIAGNOSIS — D701 Agranulocytosis secondary to cancer chemotherapy: Secondary | ICD-10-CM | POA: Diagnosis not present

## 2020-09-18 LAB — CBC WITH DIFFERENTIAL (CANCER CENTER ONLY)
Abs Immature Granulocytes: 2.41 10*3/uL — ABNORMAL HIGH (ref 0.00–0.07)
Basophils Absolute: 0.1 10*3/uL (ref 0.0–0.1)
Basophils Relative: 0 %
Eosinophils Absolute: 0.1 10*3/uL (ref 0.0–0.5)
Eosinophils Relative: 1 %
HCT: 38.1 % — ABNORMAL LOW (ref 39.0–52.0)
Hemoglobin: 11.6 g/dL — ABNORMAL LOW (ref 13.0–17.0)
Immature Granulocytes: 15 %
Lymphocytes Relative: 14 %
Lymphs Abs: 2.3 10*3/uL (ref 0.7–4.0)
MCH: 25.5 pg — ABNORMAL LOW (ref 26.0–34.0)
MCHC: 30.4 g/dL (ref 30.0–36.0)
MCV: 83.7 fL (ref 80.0–100.0)
Monocytes Absolute: 1 10*3/uL (ref 0.1–1.0)
Monocytes Relative: 6 %
Neutro Abs: 10.8 10*3/uL — ABNORMAL HIGH (ref 1.7–7.7)
Neutrophils Relative %: 64 %
Platelet Count: 143 10*3/uL — ABNORMAL LOW (ref 150–400)
RBC: 4.55 MIL/uL (ref 4.22–5.81)
RDW: 22.5 % — ABNORMAL HIGH (ref 11.5–15.5)
Smear Review: NORMAL
WBC Count: 16.6 10*3/uL — ABNORMAL HIGH (ref 4.0–10.5)
nRBC: 0.8 % — ABNORMAL HIGH (ref 0.0–0.2)

## 2020-09-18 LAB — CMP (CANCER CENTER ONLY)
ALT: 10 U/L (ref 0–44)
AST: 12 U/L — ABNORMAL LOW (ref 15–41)
Albumin: 4 g/dL (ref 3.5–5.0)
Alkaline Phosphatase: 124 U/L (ref 38–126)
Anion gap: 9 (ref 5–15)
BUN: 12 mg/dL (ref 6–20)
CO2: 25 mmol/L (ref 22–32)
Calcium: 9.1 mg/dL (ref 8.9–10.3)
Chloride: 106 mmol/L (ref 98–111)
Creatinine: 1.4 mg/dL — ABNORMAL HIGH (ref 0.61–1.24)
GFR, Estimated: >60 mL/min (ref >60)
Glucose, Bld: 94 mg/dL (ref 70–99)
Potassium: 4.1 mmol/L (ref 3.5–5.1)
Sodium: 140 mmol/L (ref 135–145)
Total Bilirubin: 0.4 mg/dL (ref 0.3–1.2)
Total Protein: 6.9 g/dL (ref 6.5–8.1)

## 2020-09-18 MED ORDER — SODIUM CHLORIDE 0.9 % IV SOLN: Freq: Once | INTRAVENOUS | Status: AC

## 2020-09-18 MED ORDER — SODIUM CHLORIDE 0.9 % IV SOLN
10.0000 mg | Freq: Once | INTRAVENOUS | Status: AC
  Administered 2020-09-18: 10 mg via INTRAVENOUS
  Filled 2020-09-18: qty 1

## 2020-09-18 MED ORDER — ATROPINE SULFATE 1 MG/ML IJ SOLN
0.5000 mg | Freq: Once | INTRAMUSCULAR | Status: AC | PRN
  Administered 2020-09-18: 0.5 mg via INTRAVENOUS
  Filled 2020-09-18: qty 1

## 2020-09-18 MED ORDER — FLUOROURACIL CHEMO INJECTION 2.5 GM/50ML
400.0000 mg/m2 | Freq: Once | INTRAVENOUS | Status: AC
  Administered 2020-09-18: 700 mg via INTRAVENOUS
  Filled 2020-09-18: qty 14

## 2020-09-18 MED ORDER — SODIUM CHLORIDE 0.9 % IV SOLN
2400.0000 mg/m2 | INTRAVENOUS | Status: DC
  Administered 2020-09-18: 4300 mg via INTRAVENOUS
  Filled 2020-09-18: qty 86

## 2020-09-18 MED ORDER — SODIUM CHLORIDE 0.9 % IV SOLN
400.0000 mg/m2 | Freq: Once | INTRAVENOUS | Status: AC
  Administered 2020-09-18: 716 mg via INTRAVENOUS
  Filled 2020-09-18: qty 35.8

## 2020-09-18 MED ORDER — PALONOSETRON HCL INJECTION 0.25 MG/5ML
0.2500 mg | Freq: Once | INTRAVENOUS | Status: AC
  Administered 2020-09-18: 0.25 mg via INTRAVENOUS
  Filled 2020-09-18: qty 5

## 2020-09-18 MED ORDER — SODIUM CHLORIDE 0.9 % IV SOLN
180.0000 mg/m2 | Freq: Once | INTRAVENOUS | Status: AC
  Administered 2020-09-18: 320 mg via INTRAVENOUS
  Filled 2020-09-18: qty 5

## 2020-09-18 NOTE — Patient Instructions (Signed)
The chemotherapy medication bag should finish at 46 hours, 96 hours, or 7 days. For example, if your pump is scheduled for 46 hours and it was put on at 4:00 p.m., it should finish at 2:00 p.m. the day it is scheduled to come off regardless of your appointment time.     Estimated time to finish at 1130 Thursday .   If the display on your pump reads "Low Volume" and it is beeping, take the batteries out of the pump and come to the cancer center for it to be taken off.   If the pump alarms go off prior to the pump reading "Low Volume" then call [phone] and someone can assist you.  If the plunger comes out and the chemotherapy medication is leaking out, please use your home chemo spill kit to clean up the spill. Do NOT use paper towels or other household products.  If you have problems or questions regarding your pump, please call either [phone] (24 hours a day) or the cancer center Monday-Friday 8:00 a.m.- 4:30 p.m. at the clinic number and we will assist you. If you are unable to get assistance, then go to the nearest Emergency Department and ask the staff to contact the IV team for assistance.  [facility]  Discharge Instructions: Thank you for choosing [facility] to provide your oncology and hematology care.   If you have a lab appointment with the [facility], please go directly to the [facility] and check in at the registration area.   Wear comfortable clothing and clothing appropriate for easy access to any Portacath or PICC line.   We strive to give you quality time with your provider. You may need to reschedule your appointment if you arrive late (15 or more minutes).  Arriving late affects you and other patients whose appointments are after yours.  Also, if you miss three or more appointments without notifying the office, you may be dismissed from the clinic at the provider's discretion.      For prescription refill requests,  have your pharmacy contact our office and allow 72 hours for refills to be completed.    Today you received the following chemotherapy and/or immunotherapy agents irinotecan, leucovorin, fluorouracil    To help prevent nausea and vomiting after your treatment, we encourage you to take your nausea medication as directed.  BELOW ARE SYMPTOMS THAT SHOULD BE REPORTED IMMEDIATELY: *FEVER GREATER THAN 100.4 F (38 C) OR HIGHER *CHILLS OR SWEATING *NAUSEA AND VOMITING THAT IS NOT CONTROLLED WITH YOUR NAUSEA MEDICATION *UNUSUAL SHORTNESS OF BREATH *UNUSUAL BRUISING OR BLEEDING *URINARY PROBLEMS (pain or burning when urinating, or frequent urination) *BOWEL PROBLEMS (unusual diarrhea, constipation, pain near the anus) TENDERNESS IN MOUTH AND THROAT WITH OR WITHOUT PRESENCE OF ULCERS (sore throat, sores in mouth, or a toothache) UNUSUAL RASH, SWELLING OR PAIN  UNUSUAL VAGINAL DISCHARGE OR ITCHING   Items with * indicate a potential emergency and should be followed up as soon as possible or go to the Emergency Department if any problems should occur.  Please show the CHEMOTHERAPY ALERT CARD or IMMUNOTHERAPY ALERT CARD at check-in to the Emergency Department and triage nurse.  Should you have questions after your visit or need to cancel or reschedule your appointment, please contact [facility]  Dept: [phone]  and follow the prompts.  Office hours are 8:00 a.m. to 4:30 p.m. Monday - Friday. Please note that voicemails left after 4:00 p.m. may  not be returned until the following business day.  We are closed weekends and major holidays. You have access to a nurse at all times for urgent questions. Please call the main number to the clinic Dept: [phone] and follow the prompts.   For any non-urgent questions, you may also contact your provider using MyChart. We now offer e-Visits for anyone 67 and older to request care online for non-urgent symptoms. For details visit  [email or web address].   Also download the MyChart app! Go to the app store, search "MyChart", open the app, select [facility], and log in with your MyChart username and password.  Due to Covid, a mask is required upon entering the hospital/clinic. If you do not have a mask, one will be given to you upon arrival. For doctor visits, patients may have 1 support person aged 3 or older with them. For treatment visits, patients cannot have anyone with them due to current Covid guidelines and our immunocompromised population.   Irinotecan injection What is this medication? IRINOTECAN (ir in oh TEE kan ) is a chemotherapy drug. It is used to treat colon and rectal cancer. This medicine may be used for other purposes; ask your health care provider or pharmacist if you have questions. COMMON BRAND NAME(S): Camptosar What should I tell my care team before I take this medication? They need to know if you have any of these conditions: dehydration diarrhea infection (especially a virus infection such as chickenpox, cold sores, or herpes) liver disease low blood counts, like low white cell, platelet, or red cell counts low levels of calcium, magnesium, or potassium in the blood recent or ongoing radiation therapy an unusual or allergic reaction to irinotecan, other medicines, foods, dyes, or preservatives pregnant or trying to get pregnant breast-feeding How should I use this medication? This drug is given as an infusion into a vein. It is administered in a hospital or clinic by a specially trained health care professional. Talk to your pediatrician regarding the use of this medicine in children. Special care may be needed. Overdosage: If you think you have taken too much of this medicine contact a poison control center or emergency room at once. NOTE: This medicine is only for you. Do not share this medicine with others. What if I miss a dose? It is important not to miss your dose. Call your  doctor or health care professional if you are unable to keep an appointment. What may interact with this medication? Do not take this medicine with any of the following medications: cobicistat itraconazole This medicine may interact with the following medications: antiviral medicines for HIV or AIDS certain antibiotics like rifampin or rifabutin certain medicines for fungal infections like ketoconazole, posaconazole, and voriconazole certain medicines for seizures like carbamazepine, phenobarbital, phenotoin clarithromycin gemfibrozil nefazodone [facility]. John's Wort This list may not describe all possible interactions. Give your health care provider a list of all the medicines, herbs, non-prescription drugs, or dietary supplements you use. Also tell them if you smoke, drink alcohol, or use illegal drugs. Some items may interact with your medicine. What should I watch for while using this medication? Your condition will be monitored carefully while you are receiving this medicine. You will need important blood work done while you are taking this medicine. This drug may make you feel generally unwell. This is not uncommon, as chemotherapy can affect healthy cells as well as cancer cells. Report any side effects. Continue your course of treatment even though you feel ill unless your doctor  tells you to stop. In some cases, you may be given additional medicines to help with side effects. Follow all directions for their use. You may get drowsy or dizzy. Do not drive, use machinery, or do anything that needs mental alertness until you know how this medicine affects you. Do not stand or sit up quickly, especially if you are an older patient. This reduces the risk of dizzy or fainting spells. Call your health care professional for advice if you get a fever, chills, or sore throat, or other symptoms of a cold or flu. Do not treat yourself. This medicine decreases your body's ability to fight infections. Try to  avoid being around people who are sick. Avoid taking products that contain aspirin, acetaminophen, ibuprofen, naproxen, or ketoprofen unless instructed by your doctor. These medicines may hide a fever. This medicine may increase your risk to bruise or bleed. Call your doctor or health care professional if you notice any unusual bleeding. Be careful brushing and flossing your teeth or using a toothpick because you may get an infection or bleed more easily. If you have any dental work done, tell your dentist you are receiving this medicine. Do not become pregnant while taking this medicine or for 6 months after stopping it. Women should inform their health care professional if they wish to become pregnant or think they might be pregnant. Men should not father a child while taking this medicine and for 3 months after stopping it. There is potential for serious side effects to an unborn child. Talk to your health care professional for more information. Do not breast-feed an infant while taking this medicine or for 7 days after stopping it. This medicine has caused ovarian failure in some women. This medicine may make it more difficult to get pregnant. Talk to your health care professional if you are concerned about your fertility. This medicine has caused decreased sperm counts in some men. This may make it more difficult to father a child. Talk to your health care professional if you are concerned about your fertility. What side effects may I notice from receiving this medication? Side effects that you should report to your doctor or health care professional as soon as possible: allergic reactions like skin rash, itching or hives, swelling of the face, lips, or tongue chest pain diarrhea flushing, runny nose, sweating during infusion low blood counts - this medicine may decrease the number of white blood cells, red blood cells and platelets. You may be at increased risk for infections and  bleeding. nausea, vomiting pain, swelling, warmth in the leg signs of decreased platelets or bleeding - bruising, pinpoint red spots on the skin, black, tarry stools, blood in the urine signs of infection - fever or chills, cough, sore throat, pain or difficulty passing urine signs of decreased red blood cells - unusually weak or tired, fainting spells, lightheadedness Side effects that usually do not require medical attention (report to your doctor or health care professional if they continue or are bothersome): constipation hair loss headache loss of appetite mouth sores stomach pain This list may not describe all possible side effects. Call your doctor for medical advice about side effects. You may report side effects to FDA at 1-800-FDA-1088. Where should I keep my medication? This drug is given in a hospital or clinic and will not be stored at home. NOTE: This sheet is a summary. It may not cover all possible information. If you have questions about this medicine, talk to your doctor, pharmacist,  or health care provider.  2022 Elsevier/Gold Standard (2018-11-30 17:46:13)  Leucovorin injection What is this medication? LEUCOVORIN (loo koe VOR in) is used to prevent or treat the harmful effects of some medicines. This medicine is used to treat anemia caused by a low amount of folic acid in the body. It is also used with 5-fluorouracil (5-FU) to treat colon cancer. This medicine may be used for other purposes; ask your health care provider or pharmacist if you have questions. What should I tell my care team before I take this medication? They need to know if you have any of these conditions: anemia from low levels of vitamin B-12 in the blood an unusual or allergic reaction to leucovorin, folic acid, other medicines, foods, dyes, or preservatives pregnant or trying to get pregnant breast-feeding How should I use this medication? This medicine is for injection into a muscle or into a  vein. It is given by a health care professional in a hospital or clinic setting. Talk to your pediatrician regarding the use of this medicine in children. Special care may be needed. Overdosage: If you think you have taken too much of this medicine contact a poison control center or emergency room at once. NOTE: This medicine is only for you. Do not share this medicine with others. What if I miss a dose? This does not apply. What may interact with this medication? capecitabine fluorouracil phenobarbital phenytoin primidone trimethoprim-sulfamethoxazole This list may not describe all possible interactions. Give your health care provider a list of all the medicines, herbs, non-prescription drugs, or dietary supplements you use. Also tell them if you smoke, drink alcohol, or use illegal drugs. Some items may interact with your medicine. What should I watch for while using this medication? Your condition will be monitored carefully while you are receiving this medicine. This medicine may increase the side effects of 5-fluorouracil, 5-FU. Tell your doctor or health care professional if you have diarrhea or mouth sores that do not get better or that get worse. What side effects may I notice from receiving this medication? Side effects that you should report to your doctor or health care professional as soon as possible: allergic reactions like skin rash, itching or hives, swelling of the face, lips, or tongue breathing problems fever, infection mouth sores unusual bleeding or bruising unusually weak or tired Side effects that usually do not require medical attention (report to your doctor or health care professional if they continue or are bothersome): constipation or diarrhea loss of appetite nausea, vomiting This list may not describe all possible side effects. Call your doctor for medical advice about side effects. You may report side effects to FDA at 1-800-FDA-1088. Where should I keep  my medication? This drug is given in a hospital or clinic and will not be stored at home. NOTE: This sheet is a summary. It may not cover all possible information. If you have questions about this medicine, talk to your doctor, pharmacist, or health care provider.  2022 Elsevier/Gold Standard (2007-07-06 16:50:29)  Fluorouracil, 5-FU injection What is this medication? FLUOROURACIL, 5-FU (flure oh YOOR a sil) is a chemotherapy drug. It slows the growth of cancer cells. This medicine is used to treat many types of cancer like breast cancer, colon or rectal cancer, pancreatic cancer, and stomach cancer. This medicine may be used for other purposes; ask your health care provider or pharmacist if you have questions. COMMON BRAND NAME(S): Adrucil What should I tell my care team before I take this medication?  They need to know if you have any of these conditions: blood disorders dihydropyrimidine dehydrogenase (DPD) deficiency infection (especially a virus infection such as chickenpox, cold sores, or herpes) kidney disease liver disease malnourished, poor nutrition recent or ongoing radiation therapy an unusual or allergic reaction to fluorouracil, other chemotherapy, other medicines, foods, dyes, or preservatives pregnant or trying to get pregnant breast-feeding How should I use this medication? This drug is given as an infusion or injection into a vein. It is administered in a hospital or clinic by a specially trained health care professional. Talk to your pediatrician regarding the use of this medicine in children. Special care may be needed. Overdosage: If you think you have taken too much of this medicine contact a poison control center or emergency room at once. NOTE: This medicine is only for you. Do not share this medicine with others. What if I miss a dose? It is important not to miss your dose. Call your doctor or health care professional if you are unable to keep an  appointment. What may interact with this medication? Do not take this medicine with any of the following medications: live virus vaccines This medicine may also interact with the following medications: medicines that treat or prevent blood clots like warfarin, enoxaparin, and dalteparin This list may not describe all possible interactions. Give your health care provider a list of all the medicines, herbs, non-prescription drugs, or dietary supplements you use. Also tell them if you smoke, drink alcohol, or use illegal drugs. Some items may interact with your medicine. What should I watch for while using this medication? Visit your doctor for checks on your progress. This drug may make you feel generally unwell. This is not uncommon, as chemotherapy can affect healthy cells as well as cancer cells. Report any side effects. Continue your course of treatment even though you feel ill unless your doctor tells you to stop. In some cases, you may be given additional medicines to help with side effects. Follow all directions for their use. Call your doctor or health care professional for advice if you get a fever, chills or sore throat, or other symptoms of a cold or flu. Do not treat yourself. This drug decreases your body's ability to fight infections. Try to avoid being around people who are sick. This medicine may increase your risk to bruise or bleed. Call your doctor or health care professional if you notice any unusual bleeding. Be careful brushing and flossing your teeth or using a toothpick because you may get an infection or bleed more easily. If you have any dental work done, tell your dentist you are receiving this medicine. Avoid taking products that contain aspirin, acetaminophen, ibuprofen, naproxen, or ketoprofen unless instructed by your doctor. These medicines may hide a fever. Do not become pregnant while taking this medicine. Women should inform their doctor if they wish to become pregnant  or think they might be pregnant. There is a potential for serious side effects to an unborn child. Talk to your health care professional or pharmacist for more information. Do not breast-feed an infant while taking this medicine. Men should inform their doctor if they wish to father a child. This medicine may lower sperm counts. Do not treat diarrhea with over the counter products. Contact your doctor if you have diarrhea that lasts more than 2 days or if it is severe and watery. This medicine can make you more sensitive to the sun. Keep out of the sun. If you cannot avoid  being in the sun, wear protective clothing and use sunscreen. Do not use sun lamps or tanning beds/booths. What side effects may I notice from receiving this medication? Side effects that you should report to your doctor or health care professional as soon as possible: allergic reactions like skin rash, itching or hives, swelling of the face, lips, or tongue low blood counts - this medicine may decrease the number of white blood cells, red blood cells and platelets. You may be at increased risk for infections and bleeding. signs of infection - fever or chills, cough, sore throat, pain or difficulty passing urine signs of decreased platelets or bleeding - bruising, pinpoint red spots on the skin, black, tarry stools, blood in the urine signs of decreased red blood cells - unusually weak or tired, fainting spells, lightheadedness breathing problems changes in vision chest pain mouth sores nausea and vomiting pain, swelling, redness at site where injected pain, tingling, numbness in the hands or feet redness, swelling, or sores on hands or feet stomach pain unusual bleeding Side effects that usually do not require medical attention (report to your doctor or health care professional if they continue or are bothersome): changes in finger or toe nails diarrhea dry or itchy skin hair loss headache loss of appetite sensitivity  of eyes to the light stomach upset unusually teary eyes This list may not describe all possible side effects. Call your doctor for medical advice about side effects. You may report side effects to FDA at 1-800-FDA-1088. Where should I keep my medication? This drug is given in a hospital or clinic and will not be stored at home. NOTE: This sheet is a summary. It may not cover all possible information. If you have questions about this medicine, talk to your doctor, pharmacist, or health care provider.  2022 Elsevier/Gold Standard (2018-11-30 15:00:03)

## 2020-09-18 NOTE — Progress Notes (Deleted)
Patient presents for treatment. RN assessment completed along with the following:  Labs reviewed *** Vitals reviewed including weight *** Oncology Treatment Attestation completed *** Consent completed and reflects current therapy/intent *** MD/APP progress note reviewed *** Treatment plan/orders reviewed *** Last treatment date reviewed and appropriate      amount of time has elapsed between treatments.   Patient to proceed with treatment.

## 2020-09-18 NOTE — Progress Notes (Signed)
Patient presents for treatment. RN assessment completed along with the following:  Labs reviewed -wnl Vitals reviewed including weight -wnl Oncology Treatment Attestation completed -yes Consent completed and reflects current therapy/intent -yes MD/APP progress note reviewed -not available at this time Treatment plan/orders reviewed -yes Last treatment date reviewed and appropriate      amount of time has elapsed between treatments.   Patient to proceed with treatment.

## 2020-09-18 NOTE — Progress Notes (Signed)
[facility] OFF[facility] PROGRESS NOTE   Diagnosis: Colon cancer  INTERVAL HISTORY:   Alexander Reeves returns as scheduled.  He completed another cycle of FOLFIRI on 09/04/2020.  He is here today with a sign language interpreter.  He denies nausea, mouth sores, and diarrhea following chemotherapy.  No bleeding.  He has skin thickening at the hands.  This is not painful.  The anal fissure remains healed.  Objective:  Vital signs in last 24 hours:  Blood pressure (!) 150/86, pulse 92, temperature 98.2 F (36.8 C), temperature source Oral, resp. rate 18, height 5' 10" (1.778 m), weight 140 lb (63.5 kg), SpO2 100 %.    HEENT: No thrush or ulcers Resp: Lungs clear bilaterally Cardio: Regular rate and rhythm GI: Nontender, no hepatosplenomegaly Vascular: No leg edema  Skin: Hyperpigmentation and skin thickening at the hands  Portacath/PICC-without erythema  Lab Results:  Lab Results  Component Value Date   WBC 16.6 (H) 09/18/2020   HGB 11.6 (L) 09/18/2020   HCT 38.1 (L) 09/18/2020   MCV 83.7 09/18/2020   PLT 143 (L) 09/18/2020   NEUTROABS PENDING 09/18/2020    CMP  Lab Results  Component Value Date   NA 139 09/14/2020   K 3.5 09/14/2020   CL 104 09/14/2020   CO2 27 09/14/2020   GLUCOSE 93 09/14/2020   BUN 11 09/14/2020   CREATININE 1.28 (H) 09/14/2020   CALCIUM 9.1 09/14/2020   PROT 6.8 09/04/2020   ALBUMIN 3.8 09/04/2020   AST 13 (L) 09/04/2020   ALT 11 09/04/2020   ALKPHOS 112 09/04/2020   BILITOT 0.3 09/04/2020   GFRNONAA >60 09/14/2020   GFRAA >60 09/28/2019    Lab Results  Component Value Date   CEA1 1.75 09/28/2019    Lab Results  Component Value Date   INR 1.1 12/27/2019   LABPROT 13.3 12/27/2019    Imaging:  CT Abdomen Pelvis W Contrast  Result Date: 09/16/2020 CLINICAL DATA:  History of colon cancer.  Restaging exam. EXAM: CT ABDOMEN AND PELVIS WITH CONTRAST TECHNIQUE: Multidetector CT imaging of the abdomen and pelvis was performed  using the standard protocol following bolus administration of intravenous contrast. CONTRAST:  46m OMNIPAQUE IOHEXOL 350 MG/ML SOLN COMPARISON:  CT abdomen pelvis 06/07/2020. FINDINGS: Lower chest: Normal heart size. Dependent atelectasis left lower lobe. No pleural effusion. Hepatobiliary: Unchanged 8 mm high right hepatic lobe lesion (image 12; series 2). Right hepatic lobe lesion measures 9 mm (image 19; series 2), previously 10 mm. Inferior right hepatic lobe lesion measures 9 mm (image 33; series 2), previously 9 mm. No new or enlarging hepatic lesions are identified. No intrahepatic or extrahepatic biliary ductal dilatation. Pancreas: Unremarkable Spleen: Unremarkable Adrenals/Urinary Tract: Normal adrenal glands. Kidneys enhance symmetrically with contrast. No hydronephrosis. Urinary bladder is unremarkable. Stomach/Bowel: Normal morphology of the stomach. Prior partial ascending colectomy. Normal small bowel. Vascular/Lymphatic: Normal caliber abdominal aorta. No retroperitoneal lymphadenopathy. Reproductive: Heterogeneous prostate. Other: None. Musculoskeletal: No aggressive or acute appearing osseous lesions. IMPRESSION: Similar faint hepatic metastasis. No new sites of disease. Limited evaluation due to motion artifact. Electronically Signed   By: DLovey NewcomerM.D.   On: 09/16/2020 13:43    Medications: I have reviewed the patient's current medications.   Assessment/Plan: Moderately differentiated adenocarcinoma ascending colon, stage IIIb (pT3pN1), status post a right colectomy 11/19/2018 Lymphovascular and perineural invasion present, 2/14 lymph nodes positive, tumor deposits present Positive radial margin, no loss of mismatch repair protein expression, discussed case with Dr. WCraig Staggersmass with surrounding  inflammation at multiple margins, no gross residual disease, unclear which is the "positive "radial margin, further surgery and radiation not recommended Colonoscopy  11/19/2018-completely obstructing mid ascending colon mass, could not be passed with endoscope, biopsy confirmed invasive adenocarcinoma CT abdomen/pelvis 11/18/2018-wall thickening at the mid and distal ascending colon with mild distention of the proximal ascending colon and cecum CTs 10/17/2018--no acute findings, no chest lymphadenopathy, lungs clear Cycle 1 FOLFOX 12/28/2018 Cycle 2 FOLFOX 01/11/2019, Udenyca added  Cycle 3 FOLFOX 01/25/2019, Udenyca held due to bone pain Cycle 4 FOLFOX 02/21/2019, Udenyca added Cycle 5 FOLFOX 03/09/2019, Udenyca Cycle 6 FOLFOX 03/21/2019, Udenyca Cycle 7 FOLFOX 04/04/2019, Udenyca Cycle 8 FOLFOX 04/18/2019, Udenyca  Cycle 9 FOLFOX 05/02/2019, Udenyca Cycle 10 FOLFOX 05/16/2019, oxaliplatin, 5-FU bolus, and Udenyca held Cycle 11 FOLFOX 05/30/2019, oxaliplatin, 5-FU bolus, and Udenyca held Cycle 12 FOLFOX 06/15/2019, oxaliplatin, 5-FU bolus and Udenyca held CTs 09/29/2019-new hypodense enhancing liver masses consistent with metastatic disease, indistinct marginated nodularity below the pancreas head-likely small lymph nodes Biopsy liver lesion 10/18/2019-adenocarcinoma consistent with history of colorectal carcinoma Cycle 1 FOLFIRI/Avastin 11/01/2019 Cycle 2 FOLFIRI/Avastin 11/15/2019 Cycle 3 FOLFIRI/Avastin 11/29/2019 Cycle 4 FOLFIRI/Avastin 12/13/2019 CT chest 12/27/2019-no significant change in hepatic metastases, submassive pulmonary emboli Cycle 5 FOLFIRI 01/10/2020 (Avastin discontinued) Cycle 6 FOLFIRI 02/01/2020 Cycle 7 FOLFIRI 02/13/2020 Cycle 8 FOLFIRI 02/27/2020 CTs abdomen/pelvis 03/09/2020-per Dr. Gearldine Shown review overall stable disease Cycle 9 FOLFIRI 04/16/2020 Cycle 10 FOLFIRI 05/01/2020 Cycle 11 FOLFIRI 05/15/2020 Cycle 12 FOLFIRI 05/29/2020 CT abdomen/pelvis 06/07/2020- decreased size of liver lesions, no new lesions Cycle 13 FOLFIRI 06/12/2020 Cycle 14 FOLFIRI 07/10/2020 Cycle 15 FOLFIRI 07/24/2020 Cycle 16 FOLFIRI 08/07/2020 Cycle 17 FOLFIRI 08/21/2020 Cycle  18 FOLFIRI 09/04/2020 CT abdomen/pelvis 09/14/2020-stable faint liver lesions, no new lesions, no new site of metastatic disease   Deaf Right epididymal cyst removal 03/22/2018 Asthma Port-A-Cath placement, [name], 12/23/2018 Neutropenia secondary to chemotherapy-Udenyca added for cycle 2 FOLFOX Admission with febrile neutropenia 02/08/2019 Hospital admission with submassive PE and left lower extremity DVT on 12/27/2019, heparin, transition to Xarelto 12/28/2019 CT chest 12/27/2019-submassive pulmonary emboli with evidence of right heart strain, no significant change in hepatic metastases      Disposition: Alexander Reeves appears stable.  The restaging CTs reveal stable versus slightly improved liver lesions.  No evidence of disease progression.  I discussed the CT findings and reviewed the images with Alexander Reeves.  I recommend continuing FOLFIRI.  FOLFIRI will be changed to a 3-week schedule.  He understands no therapy will be curative.  We will consider changing to a maintenance regimen if he has stable disease on the next restaging evaluation.  He will complete another cycle of FOLFIRI today.  He will return for an office visit and chemotherapy in 3 weeks.  Betsy Coder, MD  09/18/2020  9:50 AM

## 2020-09-20 ENCOUNTER — Inpatient Hospital Stay: Payer: Medicare Other

## 2020-09-20 ENCOUNTER — Other Ambulatory Visit: Payer: Self-pay

## 2020-09-20 VITALS — BP 106/78 | HR 77 | Temp 98.6°F | Resp 18

## 2020-09-20 DIAGNOSIS — C779 Secondary and unspecified malignant neoplasm of lymph node, unspecified: Secondary | ICD-10-CM | POA: Diagnosis not present

## 2020-09-20 DIAGNOSIS — D701 Agranulocytosis secondary to cancer chemotherapy: Secondary | ICD-10-CM | POA: Diagnosis not present

## 2020-09-20 DIAGNOSIS — C182 Malignant neoplasm of ascending colon: Secondary | ICD-10-CM

## 2020-09-20 DIAGNOSIS — Z5111 Encounter for antineoplastic chemotherapy: Secondary | ICD-10-CM | POA: Diagnosis not present

## 2020-09-20 DIAGNOSIS — C189 Malignant neoplasm of colon, unspecified: Secondary | ICD-10-CM

## 2020-09-20 DIAGNOSIS — C787 Secondary malignant neoplasm of liver and intrahepatic bile duct: Secondary | ICD-10-CM | POA: Diagnosis not present

## 2020-09-20 MED ORDER — SODIUM CHLORIDE 0.9% FLUSH
10.0000 mL | INTRAVENOUS | Status: DC | PRN
  Administered 2020-09-20: 10 mL

## 2020-09-20 MED ORDER — PEGFILGRASTIM-CBQV 6 MG/0.6ML ~~LOC~~ SOSY
6.0000 mg | PREFILLED_SYRINGE | Freq: Once | SUBCUTANEOUS | Status: AC
  Administered 2020-09-20: 6 mg via SUBCUTANEOUS

## 2020-09-20 MED ORDER — HEPARIN SOD (PORK) LOCK FLUSH 100 UNIT/ML IV SOLN
500.0000 [IU] | Freq: Once | INTRAVENOUS | Status: AC | PRN
  Administered 2020-09-20: 500 [IU]

## 2020-09-20 NOTE — Patient Instructions (Signed)

## 2020-09-20 NOTE — Progress Notes (Signed)
Alexander Reeves, Interpreter was present during patient's visit.

## 2020-09-27 ENCOUNTER — Other Ambulatory Visit (INDEPENDENT_AMBULATORY_CARE_PROVIDER_SITE_OTHER): Payer: Self-pay | Admitting: Primary Care

## 2020-09-27 ENCOUNTER — Ambulatory Visit: Payer: Medicare Other | Admitting: Podiatry

## 2020-09-27 NOTE — Telephone Encounter (Signed)
Last labs 08/25/19 future visit in 1 week

## 2020-10-05 ENCOUNTER — Ambulatory Visit (INDEPENDENT_AMBULATORY_CARE_PROVIDER_SITE_OTHER): Payer: Medicare Other | Admitting: Primary Care

## 2020-10-05 ENCOUNTER — Encounter (INDEPENDENT_AMBULATORY_CARE_PROVIDER_SITE_OTHER): Payer: Self-pay | Admitting: Primary Care

## 2020-10-05 ENCOUNTER — Other Ambulatory Visit: Payer: Self-pay

## 2020-10-05 VITALS — BP 111/77 | HR 92 | Temp 97.5°F | Ht 70.0 in | Wt 141.6 lb

## 2020-10-05 DIAGNOSIS — M2550 Pain in unspecified joint: Secondary | ICD-10-CM

## 2020-10-05 DIAGNOSIS — H913 Deaf nonspeaking, not elsewhere classified: Secondary | ICD-10-CM

## 2020-10-05 DIAGNOSIS — J45909 Unspecified asthma, uncomplicated: Secondary | ICD-10-CM

## 2020-10-05 DIAGNOSIS — C189 Malignant neoplasm of colon, unspecified: Secondary | ICD-10-CM

## 2020-10-05 DIAGNOSIS — R058 Other specified cough: Secondary | ICD-10-CM | POA: Diagnosis not present

## 2020-10-05 DIAGNOSIS — Z760 Encounter for issue of repeat prescription: Secondary | ICD-10-CM | POA: Diagnosis not present

## 2020-10-05 DIAGNOSIS — E785 Hyperlipidemia, unspecified: Secondary | ICD-10-CM | POA: Diagnosis not present

## 2020-10-05 MED ORDER — ALBUTEROL SULFATE HFA 108 (90 BASE) MCG/ACT IN AERS: 2.0000 | INHALATION_SPRAY | Freq: Four times a day (QID) | RESPIRATORY_TRACT | 2 refills | Status: DC | PRN

## 2020-10-05 MED ORDER — GUAIFENESIN 100 MG/5ML PO SOLN
5.0000 mL | ORAL | 0 refills | Status: DC | PRN
Start: 2020-10-05 — End: 2021-02-12

## 2020-10-05 MED ORDER — GABAPENTIN 300 MG PO CAPS: 300.0000 mg | ORAL_CAPSULE | Freq: Every day | ORAL | 3 refills | Status: DC

## 2020-10-05 NOTE — Patient Instructions (Addendum)
Ask oncologist for flu vaccine if no contraindications.  Preventing High Cholesterol Cholesterol is a white, waxy substance similar to fat that the human body needs to help build cells. The liver makes all the cholesterol that a person's body needs. Having high cholesterol (hypercholesterolemia) increases your risk for heart disease and stroke. Extra or excess cholesterol comes from the food that you eat. High cholesterol can often be prevented with diet and lifestyle changes. If you already have high cholesterol, you can control it with diet, lifestyle changes, and medicines. How can high cholesterol affect me? If you have high cholesterol, fatty deposits (plaques) may build up on the walls of your blood vessels. The blood vessels that carry blood away from your heart are called arteries. Plaques make the arteries narrower and stiffer. This in turn can: Restrict or block blood flow and cause blood clots to form. Increase your risk for heart attack and stroke. What can increase my risk for high cholesterol? This condition is more likely to develop in people who: Eat foods that are high in saturated fat or cholesterol. Saturated fat is mostly found in foods that come from animal sources. Are overweight. Are not getting enough exercise. Use products that contain nicotine or tobacco, such as cigarettes, e-cigarettes, and chewing tobacco. Have a family history of high cholesterol (familial hypercholesterolemia). What actions can I take to prevent this? Nutrition  Eat less saturated fat. Avoid trans fats (partially hydrogenated oils). These are often found in margarine and in some baked goods, fried foods, and snacks bought in packages. Avoid precooked or cured meat, such as bacon, sausages, or meat loaves. Avoid foods and drinks that have added sugars. Eat more fruits, vegetables, and whole grains. Choose healthy sources of protein, such as fish, poultry, lean cuts of red meat, beans, peas,  lentils, and nuts. Choose healthy sources of fat, such as: Nuts. Vegetable oils, especially olive oil. Fish that have healthy fats, such as omega-3 fatty acids. These fish include mackerel or salmon. Lifestyle Lose weight if you are overweight. Maintaining a healthy body mass index (BMI) can help prevent or control high cholesterol. It can also lower your risk for diabetes and high blood pressure. Ask your health care provider to help you with a diet and exercise plan to lose weight safely. Do not use any products that contain nicotine or tobacco. These products include cigarettes, chewing tobacco, and vaping devices, such as e-cigarettes. If you need help quitting, ask your health care provider. Alcohol use Do not drink alcohol if: Your health care provider tells you not to drink. You are pregnant, may be pregnant, or are planning to become pregnant. If you drink alcohol: Limit how much you have to: 0-1 drink a day for women. 0-2 drinks a day for men. Know how much alcohol is in your drink. In the [place] one drink equals one 12 oz bottle of beer (355 mL), one 5 oz glass of wine (148 mL), or one 1 oz glass of hard liquor (44 mL). Activity  Get enough exercise. Do exercises as told by your health care provider. Each week, do at least 150 minutes of exercise that takes a medium level of effort (moderate-intensity exercise). This kind of exercise: Makes your heart beat faster while allowing you to still be able to talk. Can be done in short sessions several times a day or longer sessions a few times a week. For example, on 5 days each week, you could walk fast or ride your bike 3 times  a day for 10 minutes each time. Medicines Your health care provider may recommend medicines to help lower cholesterol. This may be a medicine to lower the amount of cholesterol that your liver makes. You may need medicine if: Diet and lifestyle changes have not lowered your cholesterol enough. You have high  cholesterol and other risk factors for heart disease or stroke. Take over-the-counter and prescription medicines only as told by your health care provider. General information Manage your risk factors for high cholesterol. Talk with your health care provider about all your risk factors and how to lower your risk. Manage other conditions that you have, such as diabetes or high blood pressure (hypertension). Have blood tests to check your cholesterol levels at regular points in time as told by your health care provider. Keep all follow-up visits. This is important. Where to find more information [name] Heart Association: www.heart.org National Heart, Lung, and Blood Institute: https://wilson-eaton.com/ Summary High cholesterol increases your risk for heart disease and stroke. By keeping your cholesterol level low, you can reduce your risk for these conditions. High cholesterol can often be prevented with diet and lifestyle changes. Work with your health care provider to manage your risk factors, and have your blood tested regularly. This information is not intended to replace advice given to you by your health care provider. Make sure you discuss any questions you have with your health care provider. Document Revised: 03/05/2020 Document Reviewed: 03/05/2020 Elsevier Patient Education  2022 [name].

## 2020-10-07 ENCOUNTER — Other Ambulatory Visit: Payer: Self-pay | Admitting: Oncology

## 2020-10-07 NOTE — Progress Notes (Signed)
Established Patient Office Visit  Subjective:  Patient ID: Alexander Reeves., male    DOB: January 20, 1965  Age: 55 y.o. MRN: [id number]  CC:  Chief Complaint  Patient presents with   Medication Refill    HPI [place] presents for medication refill. He has been trying to fight cold cough and congestion for weeks. Described as thick white phellem .  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'd 11/2018; 2021   Pulmonary embolus ([place]) 12/27/2019   L leg dvt also   Scrotal cyst    Thyroid disease    Urinary frequency    OCC    Past Surgical History:  Procedure Laterality Date   BIOPSY  11/19/2018   Procedure: BIOPSY;  Surgeon: Thornton Park, MD;  Location: [place];  Service: Gastroenterology;;   BIOPSY  09/20/2019   Procedure: BIOPSY;  Surgeon: Thornton Park, MD;  Location: [facility];  Service: Gastroenterology;;   BLADDER SURGERY  YRS AGO   COLONOSCOPY WITH PROPOFOL N/A 11/19/2018   Procedure: COLONOSCOPY WITH PROPOFOL;  Surgeon: Thornton Park, MD;  Location: [place];  Service: Gastroenterology;  Laterality: N/A;   COLONOSCOPY WITH PROPOFOL N/A 09/20/2019   Procedure: COLONOSCOPY WITH PROPOFOL;  Surgeon: Thornton Park, MD;  Location: [facility];  Service: Gastroenterology;  Laterality: N/A;   EPIDIDYMECTOMY Right 03/22/2018   Procedure: SCROTAL EXPLORATION RIGHT  PARTIAL EPIDIDYMECTOMY;  Surgeon: [name], MD;  Location: [facility];  Service: Urology;  Laterality: Right;   EVALUATION UNDER ANESTHESIA WITH ANAL FISTULECTOMY N/A 04/05/2020   Procedure: EXAM UNDER ANESTHESIA WITH ANAL FISTULOTOMY AND EXCISION OF ANAL PAPILLA;  Surgeon: Leighton Ruff, MD;  Location: [facility];  Service: General;  Laterality: N/A;   HEMORRHOID SURGERY   WHEN YOUNG   LAPAROTOMY N/A 11/19/2018   Procedure: EXPLORATORY LAPAROTOMY;  Surgeon: Rolm Bookbinder, MD;  Location: [place];  Service: General;  Laterality: N/A;   PARTIAL COLECTOMY Right 11/19/2018   Procedure: PARTIAL COLECTOMY;  Surgeon: Rolm Bookbinder, MD;  Location: [place];  Service: General;  Laterality: Right;   POLYPECTOMY  09/20/2019   Procedure: POLYPECTOMY;  Surgeon: Thornton Park, MD;  Location: [facility];  Service: Gastroenterology;;   PORTACATH PLACEMENT Right 12/23/2018   Procedure: INSERTION PORT-A-CATH WITH ULTRASOUND GUIDANCE;  Surgeon: Rolm Bookbinder, MD;  Location: [place];  Service: General;  Laterality: Right;   SUBMUCOSAL TATTOO INJECTION  11/19/2018   Procedure: SUBMUCOSAL TATTOO INJECTION;  Surgeon: Thornton Park, MD;  Location: [facility];  Service: Gastroenterology;;   [name]    Family History  Problem Relation Age of Onset   Heart disease Father    Colon cancer Neg Hx    Esophageal cancer Neg Hx    Rectal cancer Neg Hx    Stomach cancer Neg Hx     Social History   Socioeconomic History   Marital status: Single    Spouse name: Not on file   Number of children: Not on file   Years of education: Not on file   Highest education level: Not on file  Occupational History   Not on file  Tobacco Use   Smoking status: Former    Types: Cigarettes  Quit date: 06/24/2016    Years since quitting: 4.2   Smokeless tobacco: Never  Vaping Use   Vaping Use: Never used  Substance and Sexual Activity   Alcohol use: Yes    Comment: OCC   Drug use: Yes    Types: Marijuana    Comment: weekends (last fri and sat)   Sexual activity: Never    Birth control/protection: None  Other Topics Concern   Not on file  Social History Narrative   [facility] (09/01/16):   No new pets. Moved from [place]. Questionable mold exposure at his previous residence and possibly some at his new apartment as well.    Social  Determinants of Health   Financial Resource Strain: Not on file  Food Insecurity: Not on file  Transportation Needs: Not on file  Physical Activity: Not on file  Stress: Not on file  Social Connections: Not on file  Intimate Partner Violence: Not on file    Outpatient Medications Prior to Visit  Medication Sig Dispense Refill   albuterol (PROVENTIL) (2.5 MG/3ML) 0.083% nebulizer solution Take 3 mLs (2.5 mg total) by nebulization every 6 (six) hours as needed for wheezing or shortness of breath. Please file under Medicare B plan 75 mL 12   ciclopirox (PENLAC) 8 % solution Apply topically at bedtime. Apply over nail and surrounding skin. Apply daily over previous coat. After seven (7) days, may remove with alcohol and continue cycle. (Patient taking differently: Apply topically 2 (two) times daily as needed. Apply over nail and surrounding skin. Apply daily over previous coat. After seven (7) days, may remove with alcohol and continue cycle.) 6.6 mL 2   fenofibrate (TRICOR) 145 MG tablet Take 1 tablet by mouth once daily 90 tablet 0   fluticasone furoate-vilanterol (BREO ELLIPTA) 200-25 MCG/INH AEPB Inhale 1 puff into the lungs daily. (Patient taking differently: Inhale 1 puff into the lungs daily as needed (shortness of breath).) 60 each 11   ibuprofen (ADVIL) 600 MG tablet Take 1 tablet (600 mg total) by mouth every 12 (twelve) hours as needed for moderate pain. (Patient taking differently: Take 600 mg by mouth every 12 (twelve) hours as needed for moderate pain. Takes ibuprofen) 60 tablet 1   lidocaine-prilocaine (EMLA) cream Apply 1 application topically as needed. 30 g 2   loratadine (CLARITIN) 10 MG tablet Take 10 mg by mouth daily as needed for allergies.     methylPREDNISolone (MEDROL DOSEPAK) 4 MG TBPK tablet Take as directed 21 tablet 0   montelukast (SINGULAIR) 10 MG tablet Take 1 tablet (10 mg total) by mouth at bedtime. 90 tablet 1   Naphazoline HCl (CLEAR EYES OP) Place 1 drop into  both eyes daily as needed (redness/dryness).     ondansetron (ZOFRAN) 8 MG tablet Take 1 tablet (8 mg total) by mouth every 8 (eight) hours as needed for nausea or vomiting. Start 72 hours after IV chemotherapy given 30 tablet 1   pantoprazole (PROTONIX) 40 MG tablet Take 1 tablet (40 mg total) by mouth daily. 30 tablet 5   prochlorperazine (COMPAZINE) 10 MG tablet Take 1 tablet (10 mg total) by mouth every 6 (six) hours as needed for nausea. 60 tablet 1   rivaroxaban (XARELTO) 20 MG TABS tablet Take 1 tablet (20 mg total) by mouth daily with supper. 30 tablet 5   traMADol (ULTRAM) 50 MG tablet Take 1 tablet (50 mg total) by mouth every 6 (six) hours as needed. 15 tablet 0   gabapentin (NEURONTIN) 300 MG  capsule Take 1 capsule (300 mg total) by mouth at bedtime. 90 capsule 3   VENTOLIN HFA 108 (90 Base) MCG/ACT inhaler INHALE 2 PUFFS BY MOUTH EVERY 4 HOURS AS NEEDED FOR WHEEZING FOR SHORTNESS OF BREATH 18 g 2   COVID-19 mRNA Vac-TriS, Pfizer, (PFIZER-BIONT COVID-19 VAC-TRIS) SUSP injection Inject into the muscle. 0.3 mL 0   No facility-administered medications prior to visit.    Allergies  Allergen Reactions   Cheese Shortness Of Breath and Swelling    Swelling of the throat   Eggs Or Egg-Derived Products Shortness Of Breath and Swelling    Swelling of the throat   Penicillins Anaphylaxis    Has patient had a PCN reaction causing immediate rash, facial/tongue/throat swelling, SOB or lightheadedness with hypotension: Yes Has patient had a PCN reaction causing severe rash involving mucus membranes or skin necrosis: Unknown Has patient had a PCN reaction that required hospitalization: Unknown Has patient had a PCN reaction occurring within the last 10 years: Unknown If all of the above answers are "NO", then may proceed with Cephalosporin use.    Gemfibrozil Nausea Only    ROS Review of Systems  HENT:  Positive for congestion and hearing loss.   Respiratory:  Positive for cough.   All  other systems reviewed and are negative.    Objective:    Physical Exam Vitals reviewed.  Constitutional:      Appearance: Normal appearance. He is normal weight.  HENT:     Head: Normocephalic and atraumatic.     Right Ear: External ear normal.     Left Ear: External ear normal.     Nose: Nose normal.  Eyes:     Extraocular Movements: Extraocular movements intact.     Pupils: Pupils are equal, round, and reactive to light.  Cardiovascular:     Rate and Rhythm: Normal rate and regular rhythm.  Pulmonary:     Effort: Pulmonary effort is normal.     Breath sounds: Normal breath sounds.  Musculoskeletal:        General: Normal range of motion.     Cervical back: Normal range of motion.  Skin:    General: Skin is warm and dry.  Neurological:     Mental Status: He is alert.  Psychiatric:        Mood and Affect: Mood normal.        Behavior: Behavior normal.   BP 111/77 (BP Location: Right Arm, Patient Position: Sitting, Cuff Size: Normal)   Pulse 92   Temp (!) 97.5 F (36.4 C) (Temporal)   Ht '5\' 10"'  (1.778 m)   Wt 141 lb 9.6 oz (64.2 kg)   SpO2 98%   BMI 20.32 kg/m  Wt Readings from Last 3 Encounters:  10/05/20 141 lb 9.6 oz (64.2 kg)  09/18/20 140 lb (63.5 kg)  09/04/20 142 lb (64.4 kg)     Health Maintenance Due  Topic Date Due   Zoster Vaccines- Shingrix (1 of 2) Never done   COVID-19 Vaccine (2 - Pfizer risk series) 07/05/2020   INFLUENZA VACCINE  Never done    There are no preventive care reminders to display for this patient.  Lab Results  Component Value Date   TSH 1.838 12/28/2019   Lab Results  Component Value Date   WBC 16.6 (H) 09/18/2020   HGB 11.6 (L) 09/18/2020   HCT 38.1 (L) 09/18/2020   MCV 83.7 09/18/2020   PLT 143 (L) 09/18/2020   Lab Results  Component Value Date  NA 140 09/18/2020   K 4.1 09/18/2020   CO2 25 09/18/2020   GLUCOSE 94 09/18/2020   BUN 12 09/18/2020   CREATININE 1.40 (H) 09/18/2020   BILITOT 0.4 09/18/2020    ALKPHOS 124 09/18/2020   AST 12 (L) 09/18/2020   ALT 10 09/18/2020   PROT 6.9 09/18/2020   ALBUMIN 4.0 09/18/2020   CALCIUM 9.1 09/18/2020   ANIONGAP 9 09/18/2020   Lab Results  Component Value Date   CHOL 189 08/25/2019   Lab Results  Component Value Date   HDL 63 08/25/2019   Lab Results  Component Value Date   LDLCALC 97 08/25/2019   Lab Results  Component Value Date   TRIG 168 (H) 08/25/2019   Lab Results  Component Value Date   CHOLHDL 3.0 08/25/2019   No results found for: HGBA1C  Meds ordered this encounter  Medications   gabapentin (NEURONTIN) 300 MG capsule    Sig: Take 1 capsule (300 mg total) by mouth at bedtime.    Dispense:  90 capsule    Refill:  3   guaiFENesin (ROBITUSSIN) 100 MG/5ML SOLN    Sig: Take 5 mLs (100 mg total) by mouth every 4 (four) hours as needed for cough or to loosen phlegm.    Dispense:  236 mL    Refill:  0   albuterol (VENTOLIN HFA) 108 (90 Base) MCG/ACT inhaler    Sig: Inhale 2 puffs into the lungs every 6 (six) hours as needed for wheezing or shortness of breath.    Dispense:  18 g    Refill:  2   [place] was seen today for medication refill.  Diagnoses and all orders for this visit:  Arthralgia, unspecified joint arthralgias all over  -     gabapentin (NEURONTIN) 300 MG capsule; Take 1 capsule (300 mg total) by mouth at bedtime.  Medication refill -     gabapentin (NEURONTIN) 300 MG capsule; Take 1 capsule (300 mg total) by mouth at bedtime.  Hyperlipidemia, unspecified hyperlipidemia type  Healthy lifestyle diet of fruits vegetables fish nuts whole grains and low saturated fat . Foods high in cholesterol or liver, fatty meats,cheese, butter avocados, nuts and seeds, chocolate and fried foods. -     Lipid panel; Future  Productive cough -     guaiFENesin (ROBITUSSIN) 100 MG/5ML SOLN; Take 5 mLs (100 mg total) by mouth every 4 (four) hours as needed for cough or to loosen phlegm.  Malignant neoplasm of colon,  unspecified part of colon ([place]) Refer to GI   Nonspeaking deaf Interpretor present   Chronic asthma without complication, unspecified asthma severity, unspecified whether persistent -     albuterol (VENTOLIN HFA) 108 (90 Base) MCG/ACT inhaler; Inhale 2 puffs into the lungs every 6 (six) hours as needed for wheezing or shortness of breath.   Follow-up: Return in about 4 months (around 02/04/2021) for fasting labs/ .    [name], NP

## 2020-10-08 ENCOUNTER — Other Ambulatory Visit: Payer: Self-pay

## 2020-10-08 ENCOUNTER — Telehealth (INDEPENDENT_AMBULATORY_CARE_PROVIDER_SITE_OTHER): Payer: Self-pay

## 2020-10-08 ENCOUNTER — Ambulatory Visit (INDEPENDENT_AMBULATORY_CARE_PROVIDER_SITE_OTHER): Payer: Medicare Other | Admitting: Podiatry

## 2020-10-08 DIAGNOSIS — M79675 Pain in left toe(s): Secondary | ICD-10-CM

## 2020-10-08 DIAGNOSIS — M79674 Pain in right toe(s): Secondary | ICD-10-CM

## 2020-10-08 DIAGNOSIS — G620 Drug-induced polyneuropathy: Secondary | ICD-10-CM

## 2020-10-08 DIAGNOSIS — T451X5A Adverse effect of antineoplastic and immunosuppressive drugs, initial encounter: Secondary | ICD-10-CM

## 2020-10-08 DIAGNOSIS — B351 Tinea unguium: Secondary | ICD-10-CM

## 2020-10-08 MED ORDER — GABAPENTIN 300 MG PO CAPS: 300.0000 mg | ORAL_CAPSULE | Freq: Two times a day (BID) | ORAL | 3 refills | Status: AC

## 2020-10-08 NOTE — Telephone Encounter (Signed)
Copied from [place] [phone]. Topic: General - Other >> Oct 08, 2020  2:32 PM Pawlus, Brayton Layman A wrote: Reason for CRM: Pt had some questions regarding her nebulizer, pt needed some tubing and other supplies, please advise if this is possible.

## 2020-10-08 NOTE — Progress Notes (Signed)
  Subjective:  Patient ID: Alexander Reeves., male    DOB: 1965/04/09,  MRN: [id number]  Chief Complaint  Patient presents with   Nail Problem    Thick painful toenails, 3 month follow up    55 y.o. male returns with the above complaint. History confirmed with patient.  Doing well.  The gabapentin has helped significantly though he does still have some tingling during the day especially.  He is here today with an ASL interpreter in person  Objective:  Physical Exam: warm, good capillary refill, no trophic changes or ulcerative lesions, normal DP and PT pulses.  Mild discoloration with dystrophy of the toenails bilateral hallux Left Foot: Mild tenderness palpation to metatarsal heads 1-3.  No pinpoint pain to suggest sesamoiditis Right Foot: Mild tenderness to palpation to metatarsal heads 1-3.  No pinpoint pain to suggest sesamoiditis   Radiographs: X-ray of both feet: no fracture, dislocation, swelling or degenerative changes noted Assessment:   1. Neuropathy due to chemotherapeutic drug ([place])   2. Pain due to onychomycosis of toenails of both feet       Plan:  Patient was evaluated and treated and all questions answered.  Discussed the etiology and treatment options for the condition in detail with the patient. Educated patient on the topical and oral treatment options for mycotic nails. Recommended debridement of the nails today. Sharp and mechanical debridement performed of all painful and mycotic nails today. Nails debrided in length and thickness using a nail nipper to level of comfort. Discussed treatment options including appropriate shoe gear. Follow up as needed for painful nails.   -Continue wearing supportive shoe gear -Continue lidocaine cream as needed -Increase gabapentin 300 mg to twice daily dosing   Return in about 6 months (around 04/07/2021).

## 2020-10-09 ENCOUNTER — Inpatient Hospital Stay: Payer: Medicare Other

## 2020-10-09 ENCOUNTER — Inpatient Hospital Stay: Payer: Medicare Other | Admitting: Nurse Practitioner

## 2020-10-09 ENCOUNTER — Other Ambulatory Visit (HOSPITAL_BASED_OUTPATIENT_CLINIC_OR_DEPARTMENT_OTHER): Payer: Self-pay

## 2020-10-09 DIAGNOSIS — C182 Malignant neoplasm of ascending colon: Secondary | ICD-10-CM

## 2020-10-09 NOTE — Progress Notes (Signed)
Patient arrived to infusion area for flush appointment  Patient asked if he could get tested for covid, stated he has had a runny nose and cough for the last few days.  Patient denies any other symptoms (fever/chills/GI upset). Patient states his granddaughter tested positive for covid approximately 2 weeks ago. Granddaughter lives with patient.  Both Lupita Raider, RN and Ned Card, NP made aware.  Per Lattie Haw, cancel patient's appointments for today and patient is to go and get tested for covid. Request for patient to call office as soon as he gets results from test so that his appointments can be rescheduled.  Patient informed of Suezanne Jacquet response and verbalized understanding.  Patient given information on where he can obtain at home tests and community testing.  Patient agreed to notify office of test results.

## 2020-10-09 NOTE — Patient Instructions (Signed)
Implanted Port Home Guide An implanted port is a device that is placed under the skin. It is usually placed in the chest. The device can be used to give IV medicine, to take blood, or for dialysis. You may have an implanted port if: You need IV medicine that would be irritating to the small veins in your hands or arms. You need IV medicines, such as antibiotics, for a long period of time. You need IV nutrition for a long period of time. You need dialysis. When you have a port, your health care provider can choose to use the port instead of veins in your arms for these procedures. You may have fewer limitations when using a port than you would if you used other types of long-term IVs, and you will likely be able to return to normal activities after your incision heals. An implanted port has two main parts: Reservoir. The reservoir is the part where a needle is inserted to give medicines or draw blood. The reservoir is round. After it is placed, it appears as a small, raised area under your skin. Catheter. The catheter is a thin, flexible tube that connects the reservoir to a vein. Medicine that is inserted into the reservoir goes into the catheter and then into the vein. How is my port accessed? To access your port: A numbing cream may be placed on the skin over the port site. Your health care provider will put on a mask and sterile gloves. The skin over your port will be cleaned carefully with a germ-killing soap and allowed to dry. Your health care provider will gently pinch the port and insert a needle into it. Your health care provider will check for a blood return to make sure the port is in the vein and is not clogged. If your port needs to remain accessed to get medicine continuously (constant infusion), your health care provider will place a clear bandage (dressing) over the needle site. The dressing and needle will need to be changed every week, or as told by your health care provider. What  is flushing? Flushing helps keep the port from getting clogged. Follow instructions from your health care provider about how and when to flush the port. Ports are usually flushed with saline solution or a medicine called heparin. The need for flushing will depend on how the port is used: If the port is only used from time to time to give medicines or draw blood, the port may need to be flushed: Before and after medicines have been given. Before and after blood has been drawn. As part of routine maintenance. Flushing may be recommended every 4-6 weeks. If a constant infusion is running, the port may not need to be flushed. Throw away any syringes in a disposal container that is meant for sharp items (sharps container). You can buy a sharps container from a pharmacy, or you can make one by using an empty hard plastic bottle with a cover. How long will my port stay implanted? The port can stay in for as long as your health care provider thinks it is needed. When it is time for the port to come out, a surgery will be done to remove it. The surgery will be similar to the procedure that was done to put the port in. Follow these instructions at home:  Flush your port as told by your health care provider. If you need an infusion over several days, follow instructions from your health care provider about how   to take care of your port site. Make sure you: Wash your hands with soap and water before you change your dressing. If soap and water are not available, use alcohol-based hand sanitizer. Change your dressing as told by your health care provider. Place any used dressings or infusion bags into a plastic bag. Throw that bag in the trash. Keep the dressing that covers the needle clean and dry. Do not get it wet. Do not use scissors or sharp objects near the tube. Keep the tube clamped, unless it is being used. Check your port site every day for signs of infection. Check for: Redness, swelling, or  pain. Fluid or blood. Pus or a bad smell. Protect the skin around the port site. Avoid wearing bra straps that rub or irritate the site. Protect the skin around your port from seat belts. Place a soft pad over your chest if needed. Bathe or shower as told by your health care provider. The site may get wet as long as you are not actively receiving an infusion. Return to your normal activities as told by your health care provider. Ask your health care provider what activities are safe for you. Carry a medical alert card or wear a medical alert bracelet at all times. This will let health care providers know that you have an implanted port in case of an emergency. Get help right away if: You have redness, swelling, or pain at the port site. You have fluid or blood coming from your port site. You have pus or a bad smell coming from the port site. You have a fever. Summary Implanted ports are usually placed in the chest for long-term IV access. Follow instructions from your health care provider about flushing the port and changing bandages (dressings). Take care of the area around your port by avoiding clothing that puts pressure on the area, and by watching for signs of infection. Protect the skin around your port from seat belts. Place a soft pad over your chest if needed. Get help right away if you have a fever or you have redness, swelling, pain, drainage, or a bad smell at the port site. This information is not intended to replace advice given to you by your health care provider. Make sure you discuss any questions you have with your health care provider. Document Revised: 03/21/2020 Document Reviewed: 05/16/2019 Elsevier Patient Education  2022 Elsevier Inc.  

## 2020-10-11 ENCOUNTER — Inpatient Hospital Stay: Payer: Medicare Other

## 2020-10-12 ENCOUNTER — Telehealth: Payer: Self-pay | Admitting: *Deleted

## 2020-10-12 NOTE — Telephone Encounter (Signed)
Patient asking PCP office for nebulizer tubing and other supplies--they are unaware of where has obtained these in the past and have asked oncology to manage this. Called patient using deaf interpreter service to please call with exactly what supplies he needs and where these were obtained in the past. He has multiple refills of the albuterol at [facility]. Informed patient had has #12 refills on the medication and all he needs to do is call for refills. He said he needed the albuterol and tubing/supplies for nebulizer machine. States all of this came from [place]. Called [facility] and confirmed he has refills--requested they prepare refill for him to pick up. [facility] does not and never has provided his DME. Order sent with chart information to [place] for supplies.

## 2020-10-26 DIAGNOSIS — U071 COVID-19: Secondary | ICD-10-CM | POA: Diagnosis not present

## 2020-10-29 ENCOUNTER — Encounter: Payer: Self-pay | Admitting: Oncology

## 2020-10-30 ENCOUNTER — Ambulatory Visit: Payer: Medicare Other | Admitting: Nurse Practitioner

## 2020-10-30 ENCOUNTER — Inpatient Hospital Stay: Payer: Medicare Other

## 2020-10-30 ENCOUNTER — Encounter: Payer: Self-pay | Admitting: *Deleted

## 2020-10-30 ENCOUNTER — Other Ambulatory Visit: Payer: Medicare Other

## 2020-10-30 ENCOUNTER — Inpatient Hospital Stay (HOSPITAL_BASED_OUTPATIENT_CLINIC_OR_DEPARTMENT_OTHER): Payer: Medicare Other | Admitting: Nurse Practitioner

## 2020-10-30 ENCOUNTER — Encounter: Payer: Self-pay | Admitting: Nurse Practitioner

## 2020-10-30 ENCOUNTER — Inpatient Hospital Stay: Payer: Medicare Other | Attending: Nurse Practitioner | Admitting: Oncology

## 2020-10-30 ENCOUNTER — Other Ambulatory Visit: Payer: Self-pay

## 2020-10-30 VITALS — BP 143/82 | HR 82 | Temp 97.7°F | Resp 18 | Ht 70.0 in | Wt 143.8 lb

## 2020-10-30 DIAGNOSIS — Z5111 Encounter for antineoplastic chemotherapy: Secondary | ICD-10-CM | POA: Diagnosis not present

## 2020-10-30 DIAGNOSIS — C182 Malignant neoplasm of ascending colon: Secondary | ICD-10-CM

## 2020-10-30 DIAGNOSIS — C787 Secondary malignant neoplasm of liver and intrahepatic bile duct: Secondary | ICD-10-CM | POA: Diagnosis not present

## 2020-10-30 DIAGNOSIS — C189 Malignant neoplasm of colon, unspecified: Secondary | ICD-10-CM

## 2020-10-30 DIAGNOSIS — D701 Agranulocytosis secondary to cancer chemotherapy: Secondary | ICD-10-CM | POA: Insufficient documentation

## 2020-10-30 DIAGNOSIS — C779 Secondary and unspecified malignant neoplasm of lymph node, unspecified: Secondary | ICD-10-CM | POA: Diagnosis not present

## 2020-10-30 LAB — CMP (CANCER CENTER ONLY)
ALT: 8 U/L (ref 0–44)
AST: 13 U/L — ABNORMAL LOW (ref 15–41)
Albumin: 4 g/dL (ref 3.5–5.0)
Alkaline Phosphatase: 57 U/L (ref 38–126)
Anion gap: 7 (ref 5–15)
BUN: 13 mg/dL (ref 6–20)
CO2: 25 mmol/L (ref 22–32)
Calcium: 9.2 mg/dL (ref 8.9–10.3)
Chloride: 109 mmol/L (ref 98–111)
Creatinine: 1.04 mg/dL (ref 0.61–1.24)
GFR, Estimated: >60 mL/min (ref >60)
Glucose, Bld: 84 mg/dL (ref 70–99)
Potassium: 3.8 mmol/L (ref 3.5–5.1)
Sodium: 141 mmol/L (ref 135–145)
Total Bilirubin: 0.6 mg/dL (ref 0.3–1.2)
Total Protein: 6.6 g/dL (ref 6.5–8.1)

## 2020-10-30 LAB — CBC WITH DIFFERENTIAL (CANCER CENTER ONLY)
Abs Immature Granulocytes: 0.01 10*3/uL (ref 0.00–0.07)
Basophils Absolute: 0.1 10*3/uL (ref 0.0–0.1)
Basophils Relative: 1 %
Eosinophils Absolute: 0.6 10*3/uL — ABNORMAL HIGH (ref 0.0–0.5)
Eosinophils Relative: 12 %
HCT: 37.1 % — ABNORMAL LOW (ref 39.0–52.0)
Hemoglobin: 11.5 g/dL — ABNORMAL LOW (ref 13.0–17.0)
Immature Granulocytes: 0 %
Lymphocytes Relative: 24 %
Lymphs Abs: 1.2 10*3/uL (ref 0.7–4.0)
MCH: 25.7 pg — ABNORMAL LOW (ref 26.0–34.0)
MCHC: 31 g/dL (ref 30.0–36.0)
MCV: 83 fL (ref 80.0–100.0)
Monocytes Absolute: 0.5 10*3/uL (ref 0.1–1.0)
Monocytes Relative: 9 %
Neutro Abs: 2.7 10*3/uL (ref 1.7–7.7)
Neutrophils Relative %: 54 %
Platelet Count: 182 10*3/uL (ref 150–400)
RBC: 4.47 MIL/uL (ref 4.22–5.81)
RDW: 18.8 % — ABNORMAL HIGH (ref 11.5–15.5)
WBC Count: 5.1 10*3/uL (ref 4.0–10.5)
nRBC: 0 % (ref 0.0–0.2)

## 2020-10-30 MED ORDER — SODIUM CHLORIDE 0.9 % IV SOLN: Freq: Once | INTRAVENOUS | Status: AC

## 2020-10-30 MED ORDER — PALONOSETRON HCL INJECTION 0.25 MG/5ML
0.2500 mg | Freq: Once | INTRAVENOUS | Status: AC
  Administered 2020-10-30: 0.25 mg via INTRAVENOUS
  Filled 2020-10-30: qty 5

## 2020-10-30 MED ORDER — ATROPINE SULFATE 1 MG/ML IV SOLN
0.5000 mg | Freq: Once | INTRAVENOUS | Status: AC | PRN
  Administered 2020-10-30: 0.5 mg via INTRAVENOUS
  Filled 2020-10-30: qty 1

## 2020-10-30 MED ORDER — FLUOROURACIL CHEMO INJECTION 2.5 GM/50ML
400.0000 mg/m2 | Freq: Once | INTRAVENOUS | Status: AC
  Administered 2020-10-30: 700 mg via INTRAVENOUS
  Filled 2020-10-30: qty 14

## 2020-10-30 MED ORDER — SODIUM CHLORIDE 0.9 % IV SOLN
2400.0000 mg/m2 | INTRAVENOUS | Status: DC
  Administered 2020-10-30: 4300 mg via INTRAVENOUS
  Filled 2020-10-30: qty 86

## 2020-10-30 MED ORDER — SODIUM CHLORIDE 0.9 % IV SOLN
180.0000 mg/m2 | Freq: Once | INTRAVENOUS | Status: AC
  Administered 2020-10-30: 320 mg via INTRAVENOUS
  Filled 2020-10-30: qty 15

## 2020-10-30 MED ORDER — SODIUM CHLORIDE 0.9 % IV SOLN
400.0000 mg/m2 | Freq: Once | INTRAVENOUS | Status: AC
  Administered 2020-10-30: 716 mg via INTRAVENOUS
  Filled 2020-10-30: qty 35.8

## 2020-10-30 MED ORDER — SODIUM CHLORIDE 0.9 % IV SOLN
10.0000 mg | Freq: Once | INTRAVENOUS | Status: AC
  Administered 2020-10-30: 10 mg via INTRAVENOUS
  Filled 2020-10-30: qty 1

## 2020-10-30 NOTE — Progress Notes (Signed)
Sign language interpretors were  Amy Rogelia Rohrer

## 2020-10-30 NOTE — Patient Instructions (Signed)
[facility]  Discharge Instructions: Thank you for choosing [facility] to provide your oncology and hematology care.   If you have a lab appointment with the [facility], please go directly to the [facility] and check in at the registration area.   Wear comfortable clothing and clothing appropriate for easy access to any Portacath or PICC line.   We strive to give you quality time with your provider. You may need to reschedule your appointment if you arrive late (15 or more minutes).  Arriving late affects you and other patients whose appointments are after yours.  Also, if you miss three or more appointments without notifying the office, you may be dismissed from the clinic at the provider's discretion.      For prescription refill requests, have your pharmacy contact our office and allow 72 hours for refills to be completed.    Today you received the following chemotherapy and/or immunotherapy agents Irinotecan, Leucovorin, 5 Fu      To help prevent nausea and vomiting after your treatment, we encourage you to take your nausea medication as directed.  BELOW ARE SYMPTOMS THAT SHOULD BE REPORTED IMMEDIATELY: *FEVER GREATER THAN 100.4 F (38 C) OR HIGHER *CHILLS OR SWEATING *NAUSEA AND VOMITING THAT IS NOT CONTROLLED WITH YOUR NAUSEA MEDICATION *UNUSUAL SHORTNESS OF BREATH *UNUSUAL BRUISING OR BLEEDING *URINARY PROBLEMS (pain or burning when urinating, or frequent urination) *BOWEL PROBLEMS (unusual diarrhea, constipation, pain near the anus) TENDERNESS IN MOUTH AND THROAT WITH OR WITHOUT PRESENCE OF ULCERS (sore throat, sores in mouth, or a toothache) UNUSUAL RASH, SWELLING OR PAIN  UNUSUAL VAGINAL DISCHARGE OR ITCHING   Items with * indicate a potential emergency and should be followed up as soon as possible or go to the Emergency Department if any problems should occur.  Please show the CHEMOTHERAPY ALERT CARD or IMMUNOTHERAPY ALERT CARD  at check-in to the Emergency Department and triage nurse.  Should you have questions after your visit or need to cancel or reschedule your appointment, please contact [facility]  Dept: [phone]  and follow the prompts.  Office hours are 8:00 a.m. to 4:30 p.m. Monday - Friday. Please note that voicemails left after 4:00 p.m. may not be returned until the following business day.  We are closed weekends and major holidays. You have access to a nurse at all times for urgent questions. Please call the main number to the clinic Dept: [phone] and follow the prompts.   For any non-urgent questions, you may also contact your provider using MyChart. We now offer e-Visits for anyone 63 and older to request care online for non-urgent symptoms. For details visit [email or web address].   Also download the MyChart app! Go to the app store, search "MyChart", open the app, select [facility], and log in with your MyChart username and password.  Due to Covid, a mask is required upon entering the hospital/clinic. If you do not have a mask, one will be given to you upon arrival. For doctor visits, patients may have 1 support person aged 90 or older with them. For treatment visits, patients cannot have anyone with them due to current Covid guidelines and our immunocompromised population.   Irinotecan injection What is this medication? IRINOTECAN (ir in oh TEE kan ) is a chemotherapy drug. It is used to treat colon and rectal cancer. This medicine may be used for other purposes; ask your health care provider or pharmacist if you have questions. COMMON  BRAND NAME(S): Camptosar What should I tell my care team before I take this medication? They need to know if you have any of these conditions: dehydration diarrhea infection (especially a virus infection such as chickenpox, cold sores, or herpes) liver disease low blood counts, like low white cell, platelet, or red cell  counts low levels of calcium, magnesium, or potassium in the blood recent or ongoing radiation therapy an unusual or allergic reaction to irinotecan, other medicines, foods, dyes, or preservatives pregnant or trying to get pregnant breast-feeding How should I use this medication? This drug is given as an infusion into a vein. It is administered in a hospital or clinic by a specially trained health care professional. Talk to your pediatrician regarding the use of this medicine in children. Special care may be needed. Overdosage: If you think you have taken too much of this medicine contact a poison control center or emergency room at once. NOTE: This medicine is only for you. Do not share this medicine with others. What if I miss a dose? It is important not to miss your dose. Call your doctor or health care professional if you are unable to keep an appointment. What may interact with this medication? Do not take this medicine with any of the following medications: cobicistat itraconazole This medicine may interact with the following medications: antiviral medicines for HIV or AIDS certain antibiotics like rifampin or rifabutin certain medicines for fungal infections like ketoconazole, posaconazole, and voriconazole certain medicines for seizures like carbamazepine, phenobarbital, phenotoin clarithromycin gemfibrozil nefazodone St. John's Wort This list may not describe all possible interactions. Give your health care provider a list of all the medicines, herbs, non-prescription drugs, or dietary supplements you use. Also tell them if you smoke, drink alcohol, or use illegal drugs. Some items may interact with your medicine. What should I watch for while using this medication? Your condition will be monitored carefully while you are receiving this medicine. You will need important blood work done while you are taking this medicine. This drug may make you feel generally unwell. This is not  uncommon, as chemotherapy can affect healthy cells as well as cancer cells. Report any side effects. Continue your course of treatment even though you feel ill unless your doctor tells you to stop. In some cases, you may be given additional medicines to help with side effects. Follow all directions for their use. You may get drowsy or dizzy. Do not drive, use machinery, or do anything that needs mental alertness until you know how this medicine affects you. Do not stand or sit up quickly, especially if you are an older patient. This reduces the risk of dizzy or fainting spells. Call your health care professional for advice if you get a fever, chills, or sore throat, or other symptoms of a cold or flu. Do not treat yourself. This medicine decreases your body's ability to fight infections. Try to avoid being around people who are sick. Avoid taking products that contain aspirin, acetaminophen, ibuprofen, naproxen, or ketoprofen unless instructed by your doctor. These medicines may hide a fever. This medicine may increase your risk to bruise or bleed. Call your doctor or health care professional if you notice any unusual bleeding. Be careful brushing and flossing your teeth or using a toothpick because you may get an infection or bleed more easily. If you have any dental work done, tell your dentist you are receiving this medicine. Do not become pregnant while taking this medicine or for 6 months after  stopping it. Women should inform their health care professional if they wish to become pregnant or think they might be pregnant. Men should not father a child while taking this medicine and for 3 months after stopping it. There is potential for serious side effects to an unborn child. Talk to your health care professional for more information. Do not breast-feed an infant while taking this medicine or for 7 days after stopping it. This medicine has caused ovarian failure in some women. This medicine may make it  more difficult to get pregnant. Talk to your health care professional if you are concerned about your fertility. This medicine has caused decreased sperm counts in some men. This may make it more difficult to father a child. Talk to your health care professional if you are concerned about your fertility. What side effects may I notice from receiving this medication? Side effects that you should report to your doctor or health care professional as soon as possible: allergic reactions like skin rash, itching or hives, swelling of the face, lips, or tongue chest pain diarrhea flushing, runny nose, sweating during infusion low blood counts - this medicine may decrease the number of white blood cells, red blood cells and platelets. You may be at increased risk for infections and bleeding. nausea, vomiting pain, swelling, warmth in the leg signs of decreased platelets or bleeding - bruising, pinpoint red spots on the skin, black, tarry stools, blood in the urine signs of infection - fever or chills, cough, sore throat, pain or difficulty passing urine signs of decreased red blood cells - unusually weak or tired, fainting spells, lightheadedness Side effects that usually do not require medical attention (report to your doctor or health care professional if they continue or are bothersome): constipation hair loss headache loss of appetite mouth sores stomach pain This list may not describe all possible side effects. Call your doctor for medical advice about side effects. You may report side effects to FDA at 1-800-FDA-1088. Where should I keep my medication? This drug is given in a hospital or clinic and will not be stored at home. NOTE: This sheet is a summary. It may not cover all possible information. If you have questions about this medicine, talk to your doctor, pharmacist, or health care provider.  2022 Elsevier/Gold Standard (2018-11-30 17:46:13)   Leucovorin injection What is this  medication? LEUCOVORIN (loo koe VOR in) is used to prevent or treat the harmful effects of some medicines. This medicine is used to treat anemia caused by a low amount of folic acid in the body. It is also used with 5-fluorouracil (5-FU) to treat colon cancer. This medicine may be used for other purposes; ask your health care provider or pharmacist if you have questions. What should I tell my care team before I take this medication? They need to know if you have any of these conditions: anemia from low levels of vitamin B-12 in the blood an unusual or allergic reaction to leucovorin, folic acid, other medicines, foods, dyes, or preservatives pregnant or trying to get pregnant breast-feeding How should I use this medication? This medicine is for injection into a muscle or into a vein. It is given by a health care professional in a hospital or clinic setting. Talk to your pediatrician regarding the use of this medicine in children. Special care may be needed. Overdosage: If you think you have taken too much of this medicine contact a poison control center or emergency room at once. NOTE: This medicine is  only for you. Do not share this medicine with others. What if I miss a dose? This does not apply. What may interact with this medication? capecitabine fluorouracil phenobarbital phenytoin primidone trimethoprim-sulfamethoxazole This list may not describe all possible interactions. Give your health care provider a list of all the medicines, herbs, non-prescription drugs, or dietary supplements you use. Also tell them if you smoke, drink alcohol, or use illegal drugs. Some items may interact with your medicine. What should I watch for while using this medication? Your condition will be monitored carefully while you are receiving this medicine. This medicine may increase the side effects of 5-fluorouracil, 5-FU. Tell your doctor or health care professional if you have diarrhea or mouth sores  that do not get better or that get worse. What side effects may I notice from receiving this medication? Side effects that you should report to your doctor or health care professional as soon as possible: allergic reactions like skin rash, itching or hives, swelling of the face, lips, or tongue breathing problems fever, infection mouth sores unusual bleeding or bruising unusually weak or tired Side effects that usually do not require medical attention (report to your doctor or health care professional if they continue or are bothersome): constipation or diarrhea loss of appetite nausea, vomiting This list may not describe all possible side effects. Call your doctor for medical advice about side effects. You may report side effects to FDA at 1-800-FDA-1088. Where should I keep my medication? This drug is given in a hospital or clinic and will not be stored at home. NOTE: This sheet is a summary. It may not cover all possible information. If you have questions about this medicine, talk to your doctor, pharmacist, or health care provider.  2022 Elsevier/Gold Standard (2007-07-06 16:50:29)  Fluorouracil, 5-FU injection What is this medication? FLUOROURACIL, 5-FU (flure oh YOOR a sil) is a chemotherapy drug. It slows the growth of cancer cells. This medicine is used to treat many types of cancer like breast cancer, colon or rectal cancer, pancreatic cancer, and stomach cancer. This medicine may be used for other purposes; ask your health care provider or pharmacist if you have questions. COMMON BRAND NAME(S): Adrucil What should I tell my care team before I take this medication? They need to know if you have any of these conditions: blood disorders dihydropyrimidine dehydrogenase (DPD) deficiency infection (especially a virus infection such as chickenpox, cold sores, or herpes) kidney disease liver disease malnourished, poor nutrition recent or ongoing radiation therapy an unusual or  allergic reaction to fluorouracil, other chemotherapy, other medicines, foods, dyes, or preservatives pregnant or trying to get pregnant breast-feeding How should I use this medication? This drug is given as an infusion or injection into a vein. It is administered in a hospital or clinic by a specially trained health care professional. Talk to your pediatrician regarding the use of this medicine in children. Special care may be needed. Overdosage: If you think you have taken too much of this medicine contact a poison control center or emergency room at once. NOTE: This medicine is only for you. Do not share this medicine with others. What if I miss a dose? It is important not to miss your dose. Call your doctor or health care professional if you are unable to keep an appointment. What may interact with this medication? Do not take this medicine with any of the following medications: live virus vaccines This medicine may also interact with the following medications: medicines that treat or prevent  blood clots like warfarin, enoxaparin, and dalteparin This list may not describe all possible interactions. Give your health care provider a list of all the medicines, herbs, non-prescription drugs, or dietary supplements you use. Also tell them if you smoke, drink alcohol, or use illegal drugs. Some items may interact with your medicine. What should I watch for while using this medication? Visit your doctor for checks on your progress. This drug may make you feel generally unwell. This is not uncommon, as chemotherapy can affect healthy cells as well as cancer cells. Report any side effects. Continue your course of treatment even though you feel ill unless your doctor tells you to stop. In some cases, you may be given additional medicines to help with side effects. Follow all directions for their use. Call your doctor or health care professional for advice if you get a fever, chills or sore throat, or  other symptoms of a cold or flu. Do not treat yourself. This drug decreases your body's ability to fight infections. Try to avoid being around people who are sick. This medicine may increase your risk to bruise or bleed. Call your doctor or health care professional if you notice any unusual bleeding. Be careful brushing and flossing your teeth or using a toothpick because you may get an infection or bleed more easily. If you have any dental work done, tell your dentist you are receiving this medicine. Avoid taking products that contain aspirin, acetaminophen, ibuprofen, naproxen, or ketoprofen unless instructed by your doctor. These medicines may hide a fever. Do not become pregnant while taking this medicine. Women should inform their doctor if they wish to become pregnant or think they might be pregnant. There is a potential for serious side effects to an unborn child. Talk to your health care professional or pharmacist for more information. Do not breast-feed an infant while taking this medicine. Men should inform their doctor if they wish to father a child. This medicine may lower sperm counts. Do not treat diarrhea with over the counter products. Contact your doctor if you have diarrhea that lasts more than 2 days or if it is severe and watery. This medicine can make you more sensitive to the sun. Keep out of the sun. If you cannot avoid being in the sun, wear protective clothing and use sunscreen. Do not use sun lamps or tanning beds/booths. What side effects may I notice from receiving this medication? Side effects that you should report to your doctor or health care professional as soon as possible: allergic reactions like skin rash, itching or hives, swelling of the face, lips, or tongue low blood counts - this medicine may decrease the number of white blood cells, red blood cells and platelets. You may be at increased risk for infections and bleeding. signs of infection - fever or chills,  cough, sore throat, pain or difficulty passing urine signs of decreased platelets or bleeding - bruising, pinpoint red spots on the skin, black, tarry stools, blood in the urine signs of decreased red blood cells - unusually weak or tired, fainting spells, lightheadedness breathing problems changes in vision chest pain mouth sores nausea and vomiting pain, swelling, redness at site where injected pain, tingling, numbness in the hands or feet redness, swelling, or sores on hands or feet stomach pain unusual bleeding Side effects that usually do not require medical attention (report to your doctor or health care professional if they continue or are bothersome): changes in finger or toe nails diarrhea dry or itchy skin  hair loss headache loss of appetite sensitivity of eyes to the light stomach upset unusually teary eyes This list may not describe all possible side effects. Call your doctor for medical advice about side effects. You may report side effects to FDA at 1-800-FDA-1088. Where should I keep my medication? This drug is given in a hospital or clinic and will not be stored at home. NOTE: This sheet is a summary. It may not cover all possible information. If you have questions about this medicine, talk to your doctor, pharmacist, or health care provider.  2022 Elsevier/Gold Standard (2018-11-30 15:00:03)

## 2020-10-30 NOTE — Progress Notes (Signed)
[facility] OFFICE PROGRESS NOTE   Diagnosis: Colon cancer  INTERVAL HISTORY:   Mr. Ledee returns for follow-up.  He completed a cycle of FOLFIRI 09/18/2020.  He presented to the office for treatment on 10/09/2020, requesting a COVID test due to symptoms and known exposure.  He was sent for COVID testing.  At today's visit he reports he has the COVID test but has not yet completed it.  Symptoms have resolved.  He feels well.  He has a good appetite.  He reports weight is stable.  No pain.  No nausea or vomiting.  No diarrhea.  Objective:  Vital signs in last 24 hours:  Blood pressure (!) 143/82, pulse 82, temperature 97.7 F (36.5 C), temperature source Oral, resp. rate 18, height '5\' 10"'  (1.778 m), weight 143 lb 12.8 oz (65.2 kg), SpO2 100 %.    HEENT: No thrush or ulcers. Resp: Lungs clear bilaterally. Cardio: Regular rate and rhythm. GI: Abdomen soft and nontender.  No hepatomegaly. Vascular: No leg edema. Skin: Palms with hyperpigmentation, skin thickening.  No skin breakdown. Port-A-Cath without erythema.   Lab Results:  Lab Results  Component Value Date   WBC 5.1 10/30/2020   HGB 11.5 (L) 10/30/2020   HCT 37.1 (L) 10/30/2020   MCV 83.0 10/30/2020   PLT 182 10/30/2020   NEUTROABS 2.7 10/30/2020    Imaging:  No results found.  Medications: I have reviewed the patient's current medications.  Assessment/Plan: Moderately differentiated adenocarcinoma ascending colon, stage IIIb (pT3pN1), status post a right colectomy 11/19/2018 Lymphovascular and perineural invasion present, 2/14 lymph nodes positive, tumor deposits present Positive radial margin, no loss of mismatch repair protein expression, discussed case with Dr. Craig Staggers mass with surrounding inflammation at multiple margins, no gross residual disease, unclear which is the "positive "radial margin, further surgery and radiation not recommended Colonoscopy 11/19/2018-completely obstructing  mid ascending colon mass, could not be passed with endoscope, biopsy confirmed invasive adenocarcinoma CT abdomen/pelvis 11/18/2018-wall thickening at the mid and distal ascending colon with mild distention of the proximal ascending colon and cecum CTs 10/17/2018--no acute findings, no chest lymphadenopathy, lungs clear Cycle 1 FOLFOX 12/28/2018 Cycle 2 FOLFOX 01/11/2019, Udenyca added  Cycle 3 FOLFOX 01/25/2019, Udenyca held due to bone pain Cycle 4 FOLFOX 02/21/2019, Udenyca added Cycle 5 FOLFOX 03/09/2019, Udenyca Cycle 6 FOLFOX 03/21/2019, Udenyca Cycle 7 FOLFOX 04/04/2019, Udenyca Cycle 8 FOLFOX 04/18/2019, Udenyca  Cycle 9 FOLFOX 05/02/2019, Udenyca Cycle 10 FOLFOX 05/16/2019, oxaliplatin, 5-FU bolus, and Udenyca held Cycle 11 FOLFOX 05/30/2019, oxaliplatin, 5-FU bolus, and Udenyca held Cycle 12 FOLFOX 06/15/2019, oxaliplatin, 5-FU bolus and Udenyca held CTs 09/29/2019-new hypodense enhancing liver masses consistent with metastatic disease, indistinct marginated nodularity below the pancreas head-likely small lymph nodes Biopsy liver lesion 10/18/2019-adenocarcinoma consistent with history of colorectal carcinoma Cycle 1 FOLFIRI/Avastin 11/01/2019 Cycle 2 FOLFIRI/Avastin 11/15/2019 Cycle 3 FOLFIRI/Avastin 11/29/2019 Cycle 4 FOLFIRI/Avastin 12/13/2019 CT chest 12/27/2019-no significant change in hepatic metastases, submassive pulmonary emboli Cycle 5 FOLFIRI 01/10/2020 (Avastin discontinued) Cycle 6 FOLFIRI 02/01/2020 Cycle 7 FOLFIRI 02/13/2020 Cycle 8 FOLFIRI 02/27/2020 CTs abdomen/pelvis 03/09/2020-per Dr. Gearldine Shown review overall stable disease Cycle 9 FOLFIRI 04/16/2020 Cycle 10 FOLFIRI 05/01/2020 Cycle 11 FOLFIRI 05/15/2020 Cycle 12 FOLFIRI 05/29/2020 CT abdomen/pelvis 06/07/2020- decreased size of liver lesions, no new lesions Cycle 13 FOLFIRI 06/12/2020 Cycle 14 FOLFIRI 07/10/2020 Cycle 15 FOLFIRI 07/24/2020 Cycle 16 FOLFIRI 08/07/2020 Cycle 17 FOLFIRI 08/21/2020 Cycle 18 FOLFIRI 09/04/2020 CT  abdomen/pelvis 09/14/2020-stable faint liver lesions, no new lesions, no new site of metastatic disease Cycle 19  09/18/2020  Treatment held 10/09/2020 due to upper respiratory symptoms, known COVID exposure Cycle 20 FOLFIRI 10/30/2020   Deaf Right epididymal cyst removal 03/22/2018 Asthma Port-A-Cath placement, [name], 12/23/2018 Neutropenia secondary to chemotherapy-Udenyca added for cycle 2 FOLFOX Admission with febrile neutropenia 02/08/2019 Hospital admission with submassive PE and left lower extremity DVT on 12/27/2019, heparin, transition to Xarelto 12/28/2019 CT chest 12/27/2019-submassive pulmonary emboli with evidence of right heart strain, no significant change in hepatic metastases    Disposition: Mr. Crofford appears stable.  He has completed 19 cycles of FOLFIRI.  Treatment was held 3 weeks ago due to upper respiratory symptoms in the setting of known COVID exposure.  Symptoms have resolved.  Plan to resume treatment today with cycle 20 FOLFIRI.  CBC and chemistry panel reviewed.  Labs adequate to proceed as above.  He will return for lab, follow-up, FOLFIRI in 3 weeks.  We are available to see him sooner if needed.    Ned Card ANP/GNP-BC   10/30/2020  10:43 AM

## 2020-10-30 NOTE — Progress Notes (Signed)
Labs

## 2020-10-30 NOTE — Progress Notes (Signed)
Provided patient a case of Ensure Plus.

## 2020-11-01 ENCOUNTER — Other Ambulatory Visit: Payer: Self-pay

## 2020-11-01 ENCOUNTER — Inpatient Hospital Stay: Payer: Medicare Other

## 2020-11-01 VITALS — BP 105/76 | HR 90 | Temp 98.2°F | Resp 18

## 2020-11-01 DIAGNOSIS — C182 Malignant neoplasm of ascending colon: Secondary | ICD-10-CM | POA: Diagnosis not present

## 2020-11-01 DIAGNOSIS — C787 Secondary malignant neoplasm of liver and intrahepatic bile duct: Secondary | ICD-10-CM | POA: Diagnosis not present

## 2020-11-01 DIAGNOSIS — Z5111 Encounter for antineoplastic chemotherapy: Secondary | ICD-10-CM | POA: Diagnosis not present

## 2020-11-01 DIAGNOSIS — D701 Agranulocytosis secondary to cancer chemotherapy: Secondary | ICD-10-CM | POA: Diagnosis not present

## 2020-11-01 DIAGNOSIS — C189 Malignant neoplasm of colon, unspecified: Secondary | ICD-10-CM

## 2020-11-01 DIAGNOSIS — C779 Secondary and unspecified malignant neoplasm of lymph node, unspecified: Secondary | ICD-10-CM | POA: Diagnosis not present

## 2020-11-01 MED ORDER — SODIUM CHLORIDE 0.9% FLUSH
10.0000 mL | INTRAVENOUS | Status: DC | PRN
  Administered 2020-11-01: 10 mL

## 2020-11-01 MED ORDER — PEGFILGRASTIM-CBQV 6 MG/0.6ML ~~LOC~~ SOSY
6.0000 mg | PREFILLED_SYRINGE | Freq: Once | SUBCUTANEOUS | Status: AC
  Administered 2020-11-01: 6 mg via SUBCUTANEOUS

## 2020-11-01 MED ORDER — HEPARIN SOD (PORK) LOCK FLUSH 100 UNIT/ML IV SOLN
500.0000 [IU] | Freq: Once | INTRAVENOUS | Status: AC | PRN
  Administered 2020-11-01: 500 [IU]

## 2020-11-01 NOTE — Patient Instructions (Signed)
[facility]  Discharge Instructions: Thank you for choosing [facility] to provide your oncology and hematology care.   If you have a lab appointment with the [facility], please go directly to the [facility] and check in at the registration area.   Wear comfortable clothing and clothing appropriate for easy access to any Portacath or PICC line.   We strive to give you quality time with your provider. You may need to reschedule your appointment if you arrive late (15 or more minutes).  Arriving late affects you and other patients whose appointments are after yours.  Also, if you miss three or more appointments without notifying the office, you may be dismissed from the clinic at the provider's discretion.      For prescription refill requests, have your pharmacy contact our office and allow 72 hours for refills to be completed.   Udenyca     To help prevent nausea and vomiting after your treatment, we encourage you to take your nausea medication as directed.  BELOW ARE SYMPTOMS THAT SHOULD BE REPORTED IMMEDIATELY: *FEVER GREATER THAN 100.4 F (38 C) OR HIGHER *CHILLS OR SWEATING *NAUSEA AND VOMITING THAT IS NOT CONTROLLED WITH YOUR NAUSEA MEDICATION *UNUSUAL SHORTNESS OF BREATH *UNUSUAL BRUISING OR BLEEDING *URINARY PROBLEMS (pain or burning when urinating, or frequent urination) *BOWEL PROBLEMS (unusual diarrhea, constipation, pain near the anus) TENDERNESS IN MOUTH AND THROAT WITH OR WITHOUT PRESENCE OF ULCERS (sore throat, sores in mouth, or a toothache) UNUSUAL RASH, SWELLING OR PAIN  UNUSUAL VAGINAL DISCHARGE OR ITCHING   Items with * indicate a potential emergency and should be followed up as soon as possible or go to the Emergency Department if any problems should occur.  Please show the CHEMOTHERAPY ALERT CARD or IMMUNOTHERAPY ALERT CARD at check-in to the Emergency Department and triage nurse.  Should you have questions after your  visit or need to cancel or reschedule your appointment, please contact [facility]  Dept: [phone]  and follow the prompts.  Office hours are 8:00 a.m. to 4:30 p.m. Monday - Friday. Please note that voicemails left after 4:00 p.m. may not be returned until the following business day.  We are closed weekends and major holidays. You have access to a nurse at all times for urgent questions. Please call the main number to the clinic Dept: [phone] and follow the prompts.   For any non-urgent questions, you may also contact your provider using MyChart. We now offer e-Visits for anyone 60 and older to request care online for non-urgent symptoms. For details visit [email or web address].   Also download the MyChart app! Go to the app store, search "MyChart", open the app, select [facility], and log in with your MyChart username and password.  Due to Covid, a mask is required upon entering the hospital/clinic. If you do not have a mask, one will be given to you upon arrival. For doctor visits, patients may have 1 support person aged 46 or older with them. For treatment visits, patients cannot have anyone with them due to current Covid guidelines and our immunocompromised population.   Pegfilgrastim injection What is this medication? PEGFILGRASTIM (PEG fil gra stim) is a long-acting granulocyte colony-stimulating factor that stimulates the growth of neutrophils, a type of white blood cell important in the body's fight against infection. It is used to reduce the incidence of fever and infection in patients with certain types of cancer who are receiving chemotherapy that affects  the bone marrow, and to increase survival after being exposed to high doses of radiation. This medicine may be used for other purposes; ask your health care provider or pharmacist if you have questions. COMMON BRAND NAME(S): Rexene Edison, Ziextenzo What should I tell my care  team before I take this medication? They need to know if you have any of these conditions: kidney disease latex allergy ongoing radiation therapy sickle cell disease skin reactions to acrylic adhesives (On-Body Injector only) an unusual or allergic reaction to pegfilgrastim, filgrastim, other medicines, foods, dyes, or preservatives pregnant or trying to get pregnant breast-feeding How should I use this medication? This medicine is for injection under the skin. If you get this medicine at home, you will be taught how to prepare and give the pre-filled syringe or how to use the On-body Injector. Refer to the patient Instructions for Use for detailed instructions. Use exactly as directed. Tell your healthcare provider immediately if you suspect that the On-body Injector may not have performed as intended or if you suspect the use of the On-body Injector resulted in a missed or partial dose. It is important that you put your used needles and syringes in a special sharps container. Do not put them in a trash can. If you do not have a sharps container, call your pharmacist or healthcare provider to get one. Talk to your pediatrician regarding the use of this medicine in children. While this drug may be prescribed for selected conditions, precautions do apply. Overdosage: If you think you have taken too much of this medicine contact a poison control center or emergency room at once. NOTE: This medicine is only for you. Do not share this medicine with others. What if I miss a dose? It is important not to miss your dose. Call your doctor or health care professional if you miss your dose. If you miss a dose due to an On-body Injector failure or leakage, a new dose should be administered as soon as possible using a single prefilled syringe for manual use. What may interact with this medication? Interactions have not been studied. This list may not describe all possible interactions. Give your health care  provider a list of all the medicines, herbs, non-prescription drugs, or dietary supplements you use. Also tell them if you smoke, drink alcohol, or use illegal drugs. Some items may interact with your medicine. What should I watch for while using this medication? Your condition will be monitored carefully while you are receiving this medicine. You may need blood work done while you are taking this medicine. Talk to your health care provider about your risk of cancer. You may be more at risk for certain types of cancer if you take this medicine. If you are going to need a MRI, CT scan, or other procedure, tell your doctor that you are using this medicine (On-Body Injector only). What side effects may I notice from receiving this medication? Side effects that you should report to your doctor or health care professional as soon as possible: allergic reactions (skin rash, itching or hives, swelling of the face, lips, or tongue) back pain dizziness fever pain, redness, or irritation at site where injected pinpoint red spots on the skin red or dark-brown urine shortness of breath or breathing problems stomach or side pain, or pain at the shoulder swelling tiredness trouble passing urine or change in the amount of urine unusual bruising or bleeding Side effects that usually do not require medical attention (report to  your doctor or health care professional if they continue or are bothersome): bone pain muscle pain This list may not describe all possible side effects. Call your doctor for medical advice about side effects. You may report side effects to FDA at 1-800-FDA-1088. Where should I keep my medication? Keep out of the reach of children. If you are using this medicine at home, you will be instructed on how to store it. Throw away any unused medicine after the expiration date on the label. NOTE: This sheet is a summary. It may not cover all possible information. If you have questions about  this medicine, talk to your doctor, pharmacist, or health care provider.  2022 Elsevier/Gold Standard (2020-01-27 11:54:14)

## 2020-11-05 ENCOUNTER — Other Ambulatory Visit (INDEPENDENT_AMBULATORY_CARE_PROVIDER_SITE_OTHER): Payer: Self-pay | Admitting: Primary Care

## 2020-11-06 ENCOUNTER — Telehealth: Payer: Self-pay | Admitting: Oncology

## 2020-11-06 NOTE — Telephone Encounter (Signed)
Patient is applying for the one-time grant ($1000) from [place] special funds through the [facility] and has been advised to bring all eligible household at this time.

## 2020-11-18 ENCOUNTER — Other Ambulatory Visit: Payer: Self-pay | Admitting: Oncology

## 2020-11-20 ENCOUNTER — Inpatient Hospital Stay: Payer: Medicare Other

## 2020-11-20 ENCOUNTER — Encounter: Payer: Self-pay | Admitting: *Deleted

## 2020-11-20 ENCOUNTER — Inpatient Hospital Stay (HOSPITAL_BASED_OUTPATIENT_CLINIC_OR_DEPARTMENT_OTHER): Payer: Medicare Other | Admitting: Oncology

## 2020-11-20 ENCOUNTER — Other Ambulatory Visit: Payer: Self-pay

## 2020-11-20 ENCOUNTER — Inpatient Hospital Stay: Payer: Medicare Other | Attending: Nurse Practitioner

## 2020-11-20 VITALS — BP 134/80 | HR 93 | Temp 97.8°F | Resp 18 | Ht 70.0 in | Wt 149.6 lb

## 2020-11-20 DIAGNOSIS — C779 Secondary and unspecified malignant neoplasm of lymph node, unspecified: Secondary | ICD-10-CM | POA: Insufficient documentation

## 2020-11-20 DIAGNOSIS — C787 Secondary malignant neoplasm of liver and intrahepatic bile duct: Secondary | ICD-10-CM | POA: Insufficient documentation

## 2020-11-20 DIAGNOSIS — C182 Malignant neoplasm of ascending colon: Secondary | ICD-10-CM | POA: Diagnosis not present

## 2020-11-20 DIAGNOSIS — Z5111 Encounter for antineoplastic chemotherapy: Secondary | ICD-10-CM | POA: Diagnosis not present

## 2020-11-20 DIAGNOSIS — D701 Agranulocytosis secondary to cancer chemotherapy: Secondary | ICD-10-CM | POA: Insufficient documentation

## 2020-11-20 DIAGNOSIS — C189 Malignant neoplasm of colon, unspecified: Secondary | ICD-10-CM

## 2020-11-20 LAB — CMP (CANCER CENTER ONLY)
ALT: 17 U/L (ref 0–44)
AST: 15 U/L (ref 15–41)
Albumin: 4.1 g/dL (ref 3.5–5.0)
Alkaline Phosphatase: 60 U/L (ref 38–126)
Anion gap: 7 (ref 5–15)
BUN: 16 mg/dL (ref 6–20)
CO2: 27 mmol/L (ref 22–32)
Calcium: 9.1 mg/dL (ref 8.9–10.3)
Chloride: 105 mmol/L (ref 98–111)
Creatinine: 1.3 mg/dL — ABNORMAL HIGH (ref 0.61–1.24)
GFR, Estimated: >60 mL/min (ref >60)
Glucose, Bld: 95 mg/dL (ref 70–99)
Potassium: 4 mmol/L (ref 3.5–5.1)
Sodium: 139 mmol/L (ref 135–145)
Total Bilirubin: 0.4 mg/dL (ref 0.3–1.2)
Total Protein: 6.9 g/dL (ref 6.5–8.1)

## 2020-11-20 LAB — CBC WITH DIFFERENTIAL (CANCER CENTER ONLY)
Abs Immature Granulocytes: 0.06 10*3/uL (ref 0.00–0.07)
Basophils Absolute: 0.1 10*3/uL (ref 0.0–0.1)
Basophils Relative: 1 %
Eosinophils Absolute: 0.1 10*3/uL (ref 0.0–0.5)
Eosinophils Relative: 2 %
HCT: 37.8 % — ABNORMAL LOW (ref 39.0–52.0)
Hemoglobin: 11.4 g/dL — ABNORMAL LOW (ref 13.0–17.0)
Immature Granulocytes: 1 %
Lymphocytes Relative: 32 %
Lymphs Abs: 1.8 10*3/uL (ref 0.7–4.0)
MCH: 25.7 pg — ABNORMAL LOW (ref 26.0–34.0)
MCHC: 30.2 g/dL (ref 30.0–36.0)
MCV: 85.3 fL (ref 80.0–100.0)
Monocytes Absolute: 0.6 10*3/uL (ref 0.1–1.0)
Monocytes Relative: 11 %
Neutro Abs: 3 10*3/uL (ref 1.7–7.7)
Neutrophils Relative %: 53 %
Platelet Count: 192 10*3/uL (ref 150–400)
RBC: 4.43 MIL/uL (ref 4.22–5.81)
RDW: 19.1 % — ABNORMAL HIGH (ref 11.5–15.5)
WBC Count: 5.7 10*3/uL (ref 4.0–10.5)
nRBC: 0 % (ref 0.0–0.2)

## 2020-11-20 MED ORDER — SODIUM CHLORIDE 0.9 % IV SOLN
300.0000 mg | Freq: Once | INTRAVENOUS | Status: AC
  Administered 2020-11-20: 300 mg via INTRAVENOUS
  Filled 2020-11-20: qty 15

## 2020-11-20 MED ORDER — SODIUM CHLORIDE 0.9 % IV SOLN: Freq: Once | INTRAVENOUS | Status: AC

## 2020-11-20 MED ORDER — SODIUM CHLORIDE 0.9 % IV SOLN
10.0000 mg | Freq: Once | INTRAVENOUS | Status: AC
  Administered 2020-11-20: 10 mg via INTRAVENOUS
  Filled 2020-11-20: qty 1

## 2020-11-20 MED ORDER — ATROPINE SULFATE 1 MG/ML IV SOLN
0.5000 mg | Freq: Once | INTRAVENOUS | Status: AC | PRN
  Administered 2020-11-20: 0.5 mg via INTRAVENOUS
  Filled 2020-11-20: qty 1

## 2020-11-20 MED ORDER — FLUOROURACIL CHEMO INJECTION 2.5 GM/50ML
400.0000 mg/m2 | Freq: Once | INTRAVENOUS | Status: AC
  Administered 2020-11-20: 700 mg via INTRAVENOUS
  Filled 2020-11-20: qty 14

## 2020-11-20 MED ORDER — SODIUM CHLORIDE 0.9 % IV SOLN
400.0000 mg/m2 | Freq: Once | INTRAVENOUS | Status: AC
  Administered 2020-11-20: 716 mg via INTRAVENOUS
  Filled 2020-11-20: qty 35.8

## 2020-11-20 MED ORDER — SODIUM CHLORIDE 0.9 % IV SOLN
2400.0000 mg/m2 | INTRAVENOUS | Status: DC
  Administered 2020-11-20: 4300 mg via INTRAVENOUS
  Filled 2020-11-20: qty 86

## 2020-11-20 MED ORDER — PALONOSETRON HCL INJECTION 0.25 MG/5ML
0.2500 mg | Freq: Once | INTRAVENOUS | Status: AC
  Administered 2020-11-20: 0.25 mg via INTRAVENOUS
  Filled 2020-11-20: qty 5

## 2020-11-20 NOTE — Progress Notes (Signed)
Patient presents for treatment. RN assessment completed along with the following:  Labs/vitals reviewed - Yes, and within treatment parameters.   Weight within 10% of previous measurement - Yes Oncology Treatment Attestation completed for current therapy- Yes, on date 10/21/19 Informed consent completed and reflects current therapy/intent - Yes, on date 11/01/19             Provider progress note reviewed - Yes, today's provider note was reviewed. Treatment/Antibody/Supportive plan reviewed - Yes, and there are no adjustments needed for today's treatment. S&H and other orders reviewed - Yes, and there are no additional orders identified. Previous treatment date reviewed - Yes, and the appropriate amount of time has elapsed between treatments.  Patient to proceed with treatment.

## 2020-11-20 NOTE — Progress Notes (Signed)
[facility] OFFICE PROGRESS NOTE   Diagnosis: Colon cancer  INTERVAL HISTORY:   Alexander Reeves completed another cycle of FOLFIRI on 10/30/2020.  No mouth sores or diarrhea.  He has nausea when he drinks too fast.  No bleeding.  He is here today with a sign language interpreter.  Objective:  Vital signs in last 24 hours:  Blood pressure 134/80, pulse 93, temperature 97.8 F (36.6 C), resp. rate 18, height _0  (1.778 m), weight 149 lb 9.6 oz (67.9 kg), SpO2 96 %.    HEENT: No thrush or ulcers, hyperpigmentation of the buccal mucosa Resp: Lungs clear, distant breath sounds, no respiratory distress Cardio: Regular rate and rhythm GI: Nontender, no hepatosplenomegaly Vascular: No leg edema  Skin: Dryness and hyperpigmentation of the hands  Portacath/PICC-without erythema  Lab Results:  Lab Results  Component Value Date   WBC 5.7 11/20/2020   HGB 11.4 (L) 11/20/2020   HCT 37.8 (L) 11/20/2020   MCV 85.3 11/20/2020   PLT 192 11/20/2020   NEUTROABS 3.0 11/20/2020    CMP  Lab Results  Component Value Date   NA 141 10/30/2020   K 3.8 10/30/2020   CL 109 10/30/2020   CO2 25 10/30/2020   GLUCOSE 84 10/30/2020   BUN 13 10/30/2020   CREATININE 1.04 10/30/2020   CALCIUM 9.2 10/30/2020   PROT 6.6 10/30/2020   ALBUMIN 4.0 10/30/2020   AST 13 (L) 10/30/2020   ALT 8 10/30/2020   ALKPHOS 57 10/30/2020   BILITOT 0.6 10/30/2020   GFRNONAA >60 10/30/2020   GFRAA >60 09/28/2019    Lab Results  Component Value Date   CEA1 1.75 09/28/2019     Medications: I have reviewed the patient's current medications.   Assessment/Plan: Moderately differentiated adenocarcinoma ascending colon, stage IIIb (pT3pN1), status post a right colectomy 11/19/2018 Lymphovascular and perineural invasion present, 2/14 lymph nodes positive, tumor deposits present Positive radial margin, no loss of mismatch repair protein expression, discussed case with Dr. Craig Staggers mass  with surrounding inflammation at multiple margins, no gross residual disease, unclear which is the "positive "radial margin, further surgery and radiation not recommended Colonoscopy 11/19/2018-completely obstructing mid ascending colon mass, could not be passed with endoscope, biopsy confirmed invasive adenocarcinoma CT abdomen/pelvis 11/18/2018-wall thickening at the mid and distal ascending colon with mild distention of the proximal ascending colon and cecum CTs 10/17/2018--no acute findings, no chest lymphadenopathy, lungs clear Cycle 1 FOLFOX 12/28/2018 Cycle 2 FOLFOX 01/11/2019, Udenyca added  Cycle 3 FOLFOX 01/25/2019, Udenyca held due to bone pain Cycle 4 FOLFOX 02/21/2019, Udenyca added Cycle 5 FOLFOX 03/09/2019, Udenyca Cycle 6 FOLFOX 03/21/2019, Udenyca Cycle 7 FOLFOX 04/04/2019, Udenyca Cycle 8 FOLFOX 04/18/2019, Udenyca  Cycle 9 FOLFOX 05/02/2019, Udenyca Cycle 10 FOLFOX 05/16/2019, oxaliplatin, 5-FU bolus, and Udenyca held Cycle 11 FOLFOX 05/30/2019, oxaliplatin, 5-FU bolus, and Udenyca held Cycle 12 FOLFOX 06/15/2019, oxaliplatin, 5-FU bolus and Udenyca held CTs 09/29/2019-new hypodense enhancing liver masses consistent with metastatic disease, indistinct marginated nodularity below the pancreas head-likely small lymph nodes Biopsy liver lesion 10/18/2019-adenocarcinoma consistent with history of colorectal carcinoma Cycle 1 FOLFIRI/Avastin 11/01/2019 Cycle 2 FOLFIRI/Avastin 11/15/2019 Cycle 3 FOLFIRI/Avastin 11/29/2019 Cycle 4 FOLFIRI/Avastin 12/13/2019 CT chest 12/27/2019-no significant change in hepatic metastases, submassive pulmonary emboli Cycle 5 FOLFIRI 01/10/2020 (Avastin discontinued) Cycle 6 FOLFIRI 02/01/2020 Cycle 7 FOLFIRI 02/13/2020 Cycle 8 FOLFIRI 02/27/2020 CTs abdomen/pelvis 03/09/2020-per Dr. Gearldine Shown review overall stable disease Cycle 9 FOLFIRI 04/16/2020 Cycle 10 FOLFIRI 05/01/2020 Cycle 11 FOLFIRI 05/15/2020 Cycle 12 FOLFIRI 05/29/2020 CT abdomen/pelvis 06/07/2020-  decreased  size of liver lesions, no new lesions Cycle 13 FOLFIRI 06/12/2020 Cycle 14 FOLFIRI 07/10/2020 Cycle 15 FOLFIRI 07/24/2020 Cycle 16 FOLFIRI 08/07/2020 Cycle 17 FOLFIRI 08/21/2020 Cycle 18 FOLFIRI 09/04/2020 CT abdomen/pelvis 09/14/2020-stable faint liver lesions, no new lesions, no new site of metastatic disease Cycle 19 09/18/2020  Treatment held 10/09/2020 due to upper respiratory symptoms, known COVID exposure Cycle 20 FOLFIRI 10/30/2020 Cycle 21 FOLFIRI 11/20/2020   Deaf Right epididymal cyst removal 03/22/2018 Asthma Port-A-Cath placement, [name], 12/23/2018 Neutropenia secondary to chemotherapy-Udenyca added for cycle 2 FOLFOX Admission with febrile neutropenia 02/08/2019 Hospital admission with submassive PE and left lower extremity DVT on 12/27/2019, heparin, transition to Xarelto 12/28/2019 CT chest 12/27/2019-submassive pulmonary emboli with evidence of right heart strain, no significant change in hepatic metastases      Disposition: Alexander Reeves appears stable.  He continues to tolerate the FOLFIRI well.  He will complete another cycle today.  He will return for an office visit and chemotherapy in 3 weeks.  He does not wish to receive an influenza vaccine at present.  He will be referred for restaging CTs in January.  Betsy Coder, MD  11/20/2020  10:24 AM

## 2020-11-20 NOTE — Patient Instructions (Signed)
[facility]  Discharge Instructions: Thank you for choosing [facility] to provide your oncology and hematology care.   If you have a lab appointment with the [facility], please go directly to the [facility] and check in at the registration area.   Wear comfortable clothing and clothing appropriate for easy access to any Portacath or PICC line.   We strive to give you quality time with your provider. You may need to reschedule your appointment if you arrive late (15 or more minutes).  Arriving late affects you and other patients whose appointments are after yours.  Also, if you miss three or more appointments without notifying the office, you may be dismissed from the clinic at the provider's discretion.      For prescription refill requests, have your pharmacy contact our office and allow 72 hours for refills to be completed.    Today you received the following chemotherapy and/or immunotherapy agents Irinotecan, Leucovorin, 5 Fu      To help prevent nausea and vomiting after your treatment, we encourage you to take your nausea medication as directed.  BELOW ARE SYMPTOMS THAT SHOULD BE REPORTED IMMEDIATELY: *FEVER GREATER THAN 100.4 F (38 C) OR HIGHER *CHILLS OR SWEATING *NAUSEA AND VOMITING THAT IS NOT CONTROLLED WITH YOUR NAUSEA MEDICATION *UNUSUAL SHORTNESS OF BREATH *UNUSUAL BRUISING OR BLEEDING *URINARY PROBLEMS (pain or burning when urinating, or frequent urination) *BOWEL PROBLEMS (unusual diarrhea, constipation, pain near the anus) TENDERNESS IN MOUTH AND THROAT WITH OR WITHOUT PRESENCE OF ULCERS (sore throat, sores in mouth, or a toothache) UNUSUAL RASH, SWELLING OR PAIN  UNUSUAL VAGINAL DISCHARGE OR ITCHING   Items with * indicate a potential emergency and should be followed up as soon as possible or go to the Emergency Department if any problems should occur.  Please show the CHEMOTHERAPY ALERT CARD or IMMUNOTHERAPY ALERT CARD  at check-in to the Emergency Department and triage nurse.  Should you have questions after your visit or need to cancel or reschedule your appointment, please contact [facility]  Dept: [phone]  and follow the prompts.  Office hours are 8:00 a.m. to 4:30 p.m. Monday - Friday. Please note that voicemails left after 4:00 p.m. may not be returned until the following business day.  We are closed weekends and major holidays. You have access to a nurse at all times for urgent questions. Please call the main number to the clinic Dept: [phone] and follow the prompts.   For any non-urgent questions, you may also contact your provider using MyChart. We now offer e-Visits for anyone 80 and older to request care online for non-urgent symptoms. For details visit [email or web address].   Also download the MyChart app! Go to the app store, search "MyChart", open the app, select [facility], and log in with your MyChart username and password.  Due to Covid, a mask is required upon entering the hospital/clinic. If you do not have a mask, one will be given to you upon arrival. For doctor visits, patients may have 1 support person aged 15 or older with them. For treatment visits, patients cannot have anyone with them due to current Covid guidelines and our immunocompromised population.   Irinotecan injection What is this medication? IRINOTECAN (ir in oh TEE kan ) is a chemotherapy drug. It is used to treat colon and rectal cancer. This medicine may be used for other purposes; ask your health care provider or pharmacist if you have questions. COMMON  BRAND NAME(S): Camptosar What should I tell my care team before I take this medication? They need to know if you have any of these conditions: dehydration diarrhea infection (especially a virus infection such as chickenpox, cold sores, or herpes) liver disease low blood counts, like low white cell, platelet, or red cell  counts low levels of calcium, magnesium, or potassium in the blood recent or ongoing radiation therapy an unusual or allergic reaction to irinotecan, other medicines, foods, dyes, or preservatives pregnant or trying to get pregnant breast-feeding How should I use this medication? This drug is given as an infusion into a vein. It is administered in a hospital or clinic by a specially trained health care professional. Talk to your pediatrician regarding the use of this medicine in children. Special care may be needed. Overdosage: If you think you have taken too much of this medicine contact a poison control center or emergency room at once. NOTE: This medicine is only for you. Do not share this medicine with others. What if I miss a dose? It is important not to miss your dose. Call your doctor or health care professional if you are unable to keep an appointment. What may interact with this medication? Do not take this medicine with any of the following medications: cobicistat itraconazole This medicine may interact with the following medications: antiviral medicines for HIV or AIDS certain antibiotics like rifampin or rifabutin certain medicines for fungal infections like ketoconazole, posaconazole, and voriconazole certain medicines for seizures like carbamazepine, phenobarbital, phenotoin clarithromycin gemfibrozil nefazodone [facility]. John's Wort This list may not describe all possible interactions. Give your health care provider a list of all the medicines, herbs, non-prescription drugs, or dietary supplements you use. Also tell them if you smoke, drink alcohol, or use illegal drugs. Some items may interact with your medicine. What should I watch for while using this medication? Your condition will be monitored carefully while you are receiving this medicine. You will need important blood work done while you are taking this medicine. This drug may make you feel generally unwell. This is not  uncommon, as chemotherapy can affect healthy cells as well as cancer cells. Report any side effects. Continue your course of treatment even though you feel ill unless your doctor tells you to stop. In some cases, you may be given additional medicines to help with side effects. Follow all directions for their use. You may get drowsy or dizzy. Do not drive, use machinery, or do anything that needs mental alertness until you know how this medicine affects you. Do not stand or sit up quickly, especially if you are an older patient. This reduces the risk of dizzy or fainting spells. Call your health care professional for advice if you get a fever, chills, or sore throat, or other symptoms of a cold or flu. Do not treat yourself. This medicine decreases your body's ability to fight infections. Try to avoid being around people who are sick. Avoid taking products that contain aspirin, acetaminophen, ibuprofen, naproxen, or ketoprofen unless instructed by your doctor. These medicines may hide a fever. This medicine may increase your risk to bruise or bleed. Call your doctor or health care professional if you notice any unusual bleeding. Be careful brushing and flossing your teeth or using a toothpick because you may get an infection or bleed more easily. If you have any dental work done, tell your dentist you are receiving this medicine. Do not become pregnant while taking this medicine or for 6 months after  stopping it. Women should inform their health care professional if they wish to become pregnant or think they might be pregnant. Men should not father a child while taking this medicine and for 3 months after stopping it. There is potential for serious side effects to an unborn child. Talk to your health care professional for more information. Do not breast-feed an infant while taking this medicine or for 7 days after stopping it. This medicine has caused ovarian failure in some women. This medicine may make it  more difficult to get pregnant. Talk to your health care professional if you are concerned about your fertility. This medicine has caused decreased sperm counts in some men. This may make it more difficult to father a child. Talk to your health care professional if you are concerned about your fertility. What side effects may I notice from receiving this medication? Side effects that you should report to your doctor or health care professional as soon as possible: allergic reactions like skin rash, itching or hives, swelling of the face, lips, or tongue chest pain diarrhea flushing, runny nose, sweating during infusion low blood counts - this medicine may decrease the number of white blood cells, red blood cells and platelets. You may be at increased risk for infections and bleeding. nausea, vomiting pain, swelling, warmth in the leg signs of decreased platelets or bleeding - bruising, pinpoint red spots on the skin, black, tarry stools, blood in the urine signs of infection - fever or chills, cough, sore throat, pain or difficulty passing urine signs of decreased red blood cells - unusually weak or tired, fainting spells, lightheadedness Side effects that usually do not require medical attention (report to your doctor or health care professional if they continue or are bothersome): constipation hair loss headache loss of appetite mouth sores stomach pain This list may not describe all possible side effects. Call your doctor for medical advice about side effects. You may report side effects to FDA at 1-800-FDA-1088. Where should I keep my medication? This drug is given in a hospital or clinic and will not be stored at home. NOTE: This sheet is a summary. It may not cover all possible information. If you have questions about this medicine, talk to your doctor, pharmacist, or health care provider.  2022 Elsevier/Gold Standard (2018-11-30 17:46:13)   Leucovorin injection What is this  medication? LEUCOVORIN (loo koe VOR in) is used to prevent or treat the harmful effects of some medicines. This medicine is used to treat anemia caused by a low amount of folic acid in the body. It is also used with 5-fluorouracil (5-FU) to treat colon cancer. This medicine may be used for other purposes; ask your health care provider or pharmacist if you have questions. What should I tell my care team before I take this medication? They need to know if you have any of these conditions: anemia from low levels of vitamin B-12 in the blood an unusual or allergic reaction to leucovorin, folic acid, other medicines, foods, dyes, or preservatives pregnant or trying to get pregnant breast-feeding How should I use this medication? This medicine is for injection into a muscle or into a vein. It is given by a health care professional in a hospital or clinic setting. Talk to your pediatrician regarding the use of this medicine in children. Special care may be needed. Overdosage: If you think you have taken too much of this medicine contact a poison control center or emergency room at once. NOTE: This medicine is  only for you. Do not share this medicine with others. What if I miss a dose? This does not apply. What may interact with this medication? capecitabine fluorouracil phenobarbital phenytoin primidone trimethoprim-sulfamethoxazole This list may not describe all possible interactions. Give your health care provider a list of all the medicines, herbs, non-prescription drugs, or dietary supplements you use. Also tell them if you smoke, drink alcohol, or use illegal drugs. Some items may interact with your medicine. What should I watch for while using this medication? Your condition will be monitored carefully while you are receiving this medicine. This medicine may increase the side effects of 5-fluorouracil, 5-FU. Tell your doctor or health care professional if you have diarrhea or mouth sores  that do not get better or that get worse. What side effects may I notice from receiving this medication? Side effects that you should report to your doctor or health care professional as soon as possible: allergic reactions like skin rash, itching or hives, swelling of the face, lips, or tongue breathing problems fever, infection mouth sores unusual bleeding or bruising unusually weak or tired Side effects that usually do not require medical attention (report to your doctor or health care professional if they continue or are bothersome): constipation or diarrhea loss of appetite nausea, vomiting This list may not describe all possible side effects. Call your doctor for medical advice about side effects. You may report side effects to FDA at 1-800-FDA-1088. Where should I keep my medication? This drug is given in a hospital or clinic and will not be stored at home. NOTE: This sheet is a summary. It may not cover all possible information. If you have questions about this medicine, talk to your doctor, pharmacist, or health care provider.  2022 Elsevier/Gold Standard (2007-07-06 16:50:29)  Fluorouracil, 5-FU injection What is this medication? FLUOROURACIL, 5-FU (flure oh YOOR a sil) is a chemotherapy drug. It slows the growth of cancer cells. This medicine is used to treat many types of cancer like breast cancer, colon or rectal cancer, pancreatic cancer, and stomach cancer. This medicine may be used for other purposes; ask your health care provider or pharmacist if you have questions. COMMON BRAND NAME(S): Adrucil What should I tell my care team before I take this medication? They need to know if you have any of these conditions: blood disorders dihydropyrimidine dehydrogenase (DPD) deficiency infection (especially a virus infection such as chickenpox, cold sores, or herpes) kidney disease liver disease malnourished, poor nutrition recent or ongoing radiation therapy an unusual or  allergic reaction to fluorouracil, other chemotherapy, other medicines, foods, dyes, or preservatives pregnant or trying to get pregnant breast-feeding How should I use this medication? This drug is given as an infusion or injection into a vein. It is administered in a hospital or clinic by a specially trained health care professional. Talk to your pediatrician regarding the use of this medicine in children. Special care may be needed. Overdosage: If you think you have taken too much of this medicine contact a poison control center or emergency room at once. NOTE: This medicine is only for you. Do not share this medicine with others. What if I miss a dose? It is important not to miss your dose. Call your doctor or health care professional if you are unable to keep an appointment. What may interact with this medication? Do not take this medicine with any of the following medications: live virus vaccines This medicine may also interact with the following medications: medicines that treat or prevent  blood clots like warfarin, enoxaparin, and dalteparin This list may not describe all possible interactions. Give your health care provider a list of all the medicines, herbs, non-prescription drugs, or dietary supplements you use. Also tell them if you smoke, drink alcohol, or use illegal drugs. Some items may interact with your medicine. What should I watch for while using this medication? Visit your doctor for checks on your progress. This drug may make you feel generally unwell. This is not uncommon, as chemotherapy can affect healthy cells as well as cancer cells. Report any side effects. Continue your course of treatment even though you feel ill unless your doctor tells you to stop. In some cases, you may be given additional medicines to help with side effects. Follow all directions for their use. Call your doctor or health care professional for advice if you get a fever, chills or sore throat, or  other symptoms of a cold or flu. Do not treat yourself. This drug decreases your body's ability to fight infections. Try to avoid being around people who are sick. This medicine may increase your risk to bruise or bleed. Call your doctor or health care professional if you notice any unusual bleeding. Be careful brushing and flossing your teeth or using a toothpick because you may get an infection or bleed more easily. If you have any dental work done, tell your dentist you are receiving this medicine. Avoid taking products that contain aspirin, acetaminophen, ibuprofen, naproxen, or ketoprofen unless instructed by your doctor. These medicines may hide a fever. Do not become pregnant while taking this medicine. Women should inform their doctor if they wish to become pregnant or think they might be pregnant. There is a potential for serious side effects to an unborn child. Talk to your health care professional or pharmacist for more information. Do not breast-feed an infant while taking this medicine. Men should inform their doctor if they wish to father a child. This medicine may lower sperm counts. Do not treat diarrhea with over the counter products. Contact your doctor if you have diarrhea that lasts more than 2 days or if it is severe and watery. This medicine can make you more sensitive to the sun. Keep out of the sun. If you cannot avoid being in the sun, wear protective clothing and use sunscreen. Do not use sun lamps or tanning beds/booths. What side effects may I notice from receiving this medication? Side effects that you should report to your doctor or health care professional as soon as possible: allergic reactions like skin rash, itching or hives, swelling of the face, lips, or tongue low blood counts - this medicine may decrease the number of white blood cells, red blood cells and platelets. You may be at increased risk for infections and bleeding. signs of infection - fever or chills,  cough, sore throat, pain or difficulty passing urine signs of decreased platelets or bleeding - bruising, pinpoint red spots on the skin, black, tarry stools, blood in the urine signs of decreased red blood cells - unusually weak or tired, fainting spells, lightheadedness breathing problems changes in vision chest pain mouth sores nausea and vomiting pain, swelling, redness at site where injected pain, tingling, numbness in the hands or feet redness, swelling, or sores on hands or feet stomach pain unusual bleeding Side effects that usually do not require medical attention (report to your doctor or health care professional if they continue or are bothersome): changes in finger or toe nails diarrhea dry or itchy skin  hair loss headache loss of appetite sensitivity of eyes to the light stomach upset unusually teary eyes This list may not describe all possible side effects. Call your doctor for medical advice about side effects. You may report side effects to FDA at 1-800-FDA-1088. Where should I keep my medication? This drug is given in a hospital or clinic and will not be stored at home. NOTE: This sheet is a summary. It may not cover all possible information. If you have questions about this medicine, talk to your doctor, pharmacist, or health care provider.  2022 Elsevier/Gold Standard (2018-11-30 15:00:03)

## 2020-11-20 NOTE — Patient Instructions (Signed)

## 2020-11-20 NOTE — Progress Notes (Signed)
Patient provided form letter from social security noting his social security income. This was forwarded to [employer] Flattery in managed care.

## 2020-11-20 NOTE — Progress Notes (Signed)
Patient seen by Dr. Sherrill today ? ?Vitals are within treatment parameters. ? ?Labs reviewed by Dr. Sherrill and are within treatment parameters. ? ?Per physician team, patient is ready for treatment and there are NO modifications to the treatment plan.  ?

## 2020-11-22 ENCOUNTER — Inpatient Hospital Stay: Payer: Medicare Other

## 2020-11-22 ENCOUNTER — Other Ambulatory Visit: Payer: Self-pay

## 2020-11-22 VITALS — BP 113/75 | HR 97 | Temp 98.7°F | Resp 18

## 2020-11-22 DIAGNOSIS — C189 Malignant neoplasm of colon, unspecified: Secondary | ICD-10-CM

## 2020-11-22 DIAGNOSIS — D701 Agranulocytosis secondary to cancer chemotherapy: Secondary | ICD-10-CM | POA: Diagnosis not present

## 2020-11-22 DIAGNOSIS — Z5111 Encounter for antineoplastic chemotherapy: Secondary | ICD-10-CM | POA: Diagnosis not present

## 2020-11-22 DIAGNOSIS — C779 Secondary and unspecified malignant neoplasm of lymph node, unspecified: Secondary | ICD-10-CM | POA: Diagnosis not present

## 2020-11-22 DIAGNOSIS — C182 Malignant neoplasm of ascending colon: Secondary | ICD-10-CM

## 2020-11-22 DIAGNOSIS — C787 Secondary malignant neoplasm of liver and intrahepatic bile duct: Secondary | ICD-10-CM | POA: Diagnosis not present

## 2020-11-22 MED ORDER — SODIUM CHLORIDE 0.9% FLUSH
10.0000 mL | INTRAVENOUS | Status: DC | PRN
  Administered 2020-11-22: 10 mL

## 2020-11-22 MED ORDER — PEGFILGRASTIM-CBQV 6 MG/0.6ML ~~LOC~~ SOSY
6.0000 mg | PREFILLED_SYRINGE | Freq: Once | SUBCUTANEOUS | Status: AC
  Administered 2020-11-22: 6 mg via SUBCUTANEOUS

## 2020-11-22 MED ORDER — HEPARIN SOD (PORK) LOCK FLUSH 100 UNIT/ML IV SOLN
500.0000 [IU] | Freq: Once | INTRAVENOUS | Status: AC | PRN
  Administered 2020-11-22: 500 [IU]

## 2020-11-22 NOTE — Patient Instructions (Signed)
[facility]  Discharge Instructions: Thank you for choosing [facility] to provide your oncology and hematology care.   If you have a lab appointment with the Cancer Center, please go directly to the Cancer Center and check in at the registration area.   Wear comfortable clothing and clothing appropriate for easy access to any Portacath or PICC line.   We strive to give you quality time with your provider. You may need to reschedule your appointment if you arrive late (15 or more minutes).  Arriving late affects you and other patients whose appointments are after yours.  Also, if you miss three or more appointments without notifying the office, you may be dismissed from the clinic at the provider's discretion.      For prescription refill requests, have your pharmacy contact our office and allow 72 hours for refills to be completed.    Today you received the following Udenyca    To help prevent nausea and vomiting after your treatment, we encourage you to take your nausea medication as directed.  BELOW ARE SYMPTOMS THAT SHOULD BE REPORTED IMMEDIATELY: *FEVER GREATER THAN 100.4 F (38 C) OR HIGHER *CHILLS OR SWEATING *NAUSEA AND VOMITING THAT IS NOT CONTROLLED WITH YOUR NAUSEA MEDICATION *UNUSUAL SHORTNESS OF BREATH *UNUSUAL BRUISING OR BLEEDING *URINARY PROBLEMS (pain or burning when urinating, or frequent urination) *BOWEL PROBLEMS (unusual diarrhea, constipation, pain near the anus) TENDERNESS IN MOUTH AND THROAT WITH OR WITHOUT PRESENCE OF ULCERS (sore throat, sores in mouth, or a toothache) UNUSUAL RASH, SWELLING OR PAIN  UNUSUAL VAGINAL DISCHARGE OR ITCHING   Items with * indicate a potential emergency and should be followed up as soon as possible or go to the Emergency Department if any problems should occur.  Please show the CHEMOTHERAPY ALERT CARD or IMMUNOTHERAPY ALERT CARD at check-in to the Emergency Department and triage  nurse.  Should you have questions after your visit or need to cancel or reschedule your appointment, please contact [facility]  Dept: [phone]  and follow the prompts.  Office hours are 8:00 a.m. to 4:30 p.m. Monday - Friday. Please note that voicemails left after 4:00 p.m. may not be returned until the following business day.  We are closed weekends and major holidays. You have access to a nurse at all times for urgent questions. Please call the main number to the clinic Dept: [phone] and follow the prompts.   For any non-urgent questions, you may also contact your provider using MyChart. We now offer e-Visits for anyone 18 and older to request care online for non-urgent symptoms. For details visit [email or web address].   Also download the MyChart app! Go to the app store, search "MyChart", open the app, select [facility], and log in with your MyChart username and password.  Due to Covid, a mask is required upon entering the hospital/clinic. If you do not have a mask, one will be given to you upon arrival. For doctor visits, patients may have 1 support person aged 18 or older with them. For treatment visits, patients cannot have anyone with them due to current Covid guidelines and our immunocompromised population.   Pegfilgrastim Injection What is this medication? PEGFILGRASTIM (PEG fil gra stim) lowers the risk of infection in people who are receiving chemotherapy. It works by helping your body make more white blood cells, which protects your body from infection. It may also be used to help people who have been exposed to high doses of   radiation. This medicine may be used for other purposes; ask your health care provider or pharmacist if you have questions. COMMON BRAND NAME(S): Fulphila, Neulasta, Nyvepria, UDENYCA, Ziextenzo What should I tell my care team before I take this medication? They need to know if you have any of these conditions: Kidney  disease Latex allergy Ongoing radiation therapy Sickle cell disease Skin reactions to acrylic adhesives (On-Body Injector only) An unusual or allergic reaction to pegfilgrastim, filgrastim, other medications, foods, dyes, or preservatives Pregnant or trying to get pregnant Breast-feeding How should I use this medication? This medication is for injection under the skin. If you get this medication at home, you will be taught how to prepare and give the pre-filled syringe or how to use the On-body Injector. Refer to the patient Instructions for Use for detailed instructions. Use exactly as directed. Tell your care team immediately if you suspect that the On-body Injector may not have performed as intended or if you suspect the use of the On-body Injector resulted in a missed or partial dose. It is important that you put your used needles and syringes in a special sharps container. Do not put them in a trash can. If you do not have a sharps container, call your pharmacist or care team to get one. Talk to your care team about the use of this medication in children. While this medication may be prescribed for selected conditions, precautions do apply. Overdosage: If you think you have taken too much of this medicine contact a poison control center or emergency room at once. NOTE: This medicine is only for you. Do not share this medicine with others. What if I miss a dose? It is important not to miss your dose. Call your care team if you miss your dose. If you miss a dose due to an On-body Injector failure or leakage, a new dose should be administered as soon as possible using a single prefilled syringe for manual use. What may interact with this medication? Interactions have not been studied. This list may not describe all possible interactions. Give your health care provider a list of all the medicines, herbs, non-prescription drugs, or dietary supplements you use. Also tell them if you smoke, drink  alcohol, or use illegal drugs. Some items may interact with your medicine. What should I watch for while using this medication? Your condition will be monitored carefully while you are receiving this medication. You may need blood work done while you are taking this medication. Talk to your care team about your risk of cancer. You may be more at risk for certain types of cancer if you take this medication. If you are going to need a MRI, CT scan, or other procedure, tell your care team that you are using this medication (On-Body Injector only). What side effects may I notice from receiving this medication? Side effects that you should report to your care team as soon as possible: Allergic reactions--skin rash, itching, hives, swelling of the face, lips, tongue, or throat Capillary leak syndrome--stomach or muscle pain, unusual weakness or fatigue, feeling faint or lightheaded, decrease in the amount of urine, swelling of the ankles, hands, or feet, trouble breathing High white blood cell level--fever, fatigue, trouble breathing, night sweats, change in vision, weight loss Inflammation of the aorta--fever, fatigue, back, chest, or stomach pain, severe headache Kidney injury (glomerulonephritis)--decrease in the amount of urine, red or dark brown urine, foamy or bubbly urine, swelling of the ankles, hands, or feet Shortness of breath or   trouble breathing Spleen injury--pain in upper left stomach or shoulder Unusual bruising or bleeding Side effects that usually do not require medical attention (report to your care team if they continue or are bothersome): Bone pain Pain in the hands or feet This list may not describe all possible side effects. Call your doctor for medical advice about side effects. You may report side effects to FDA at 1-800-FDA-1088. Where should I keep my medication? Keep out of the reach of children. If you are using this medication at home, you will be instructed on how to  store it. Throw away any unused medication after the expiration date on the label. NOTE: This sheet is a summary. It may not cover all possible information. If you have questions about this medicine, talk to your doctor, pharmacist, or health care provider.  2022 [employer] (2020-09-18 00:00:00)  

## 2020-12-04 ENCOUNTER — Other Ambulatory Visit (INDEPENDENT_AMBULATORY_CARE_PROVIDER_SITE_OTHER): Payer: Self-pay | Admitting: Primary Care

## 2020-12-04 NOTE — Telephone Encounter (Signed)
Sent to PCP ?

## 2020-12-05 ENCOUNTER — Other Ambulatory Visit (INDEPENDENT_AMBULATORY_CARE_PROVIDER_SITE_OTHER): Payer: Self-pay | Admitting: Primary Care

## 2020-12-05 MED ORDER — ALBUTEROL SULFATE (2.5 MG/3ML) 0.083% IN NEBU: 2.5000 mg | INHALATION_SOLUTION | Freq: Four times a day (QID) | RESPIRATORY_TRACT | 12 refills | Status: DC | PRN

## 2020-12-09 ENCOUNTER — Other Ambulatory Visit: Payer: Self-pay | Admitting: Oncology

## 2020-12-12 ENCOUNTER — Inpatient Hospital Stay: Payer: Medicare Other

## 2020-12-12 ENCOUNTER — Encounter: Payer: Self-pay | Admitting: Nurse Practitioner

## 2020-12-12 ENCOUNTER — Inpatient Hospital Stay (HOSPITAL_BASED_OUTPATIENT_CLINIC_OR_DEPARTMENT_OTHER): Payer: Medicare Other | Admitting: Nurse Practitioner

## 2020-12-12 ENCOUNTER — Other Ambulatory Visit: Payer: Self-pay

## 2020-12-12 ENCOUNTER — Other Ambulatory Visit (INDEPENDENT_AMBULATORY_CARE_PROVIDER_SITE_OTHER): Payer: Self-pay | Admitting: Primary Care

## 2020-12-12 VITALS — BP 120/81 | HR 91 | Temp 97.9°F | Resp 18 | Ht 70.0 in | Wt 154.0 lb

## 2020-12-12 DIAGNOSIS — Z5111 Encounter for antineoplastic chemotherapy: Secondary | ICD-10-CM | POA: Diagnosis not present

## 2020-12-12 DIAGNOSIS — C787 Secondary malignant neoplasm of liver and intrahepatic bile duct: Secondary | ICD-10-CM | POA: Diagnosis not present

## 2020-12-12 DIAGNOSIS — D701 Agranulocytosis secondary to cancer chemotherapy: Secondary | ICD-10-CM | POA: Diagnosis not present

## 2020-12-12 DIAGNOSIS — C182 Malignant neoplasm of ascending colon: Secondary | ICD-10-CM

## 2020-12-12 DIAGNOSIS — C189 Malignant neoplasm of colon, unspecified: Secondary | ICD-10-CM

## 2020-12-12 DIAGNOSIS — C779 Secondary and unspecified malignant neoplasm of lymph node, unspecified: Secondary | ICD-10-CM | POA: Diagnosis not present

## 2020-12-12 LAB — CMP (CANCER CENTER ONLY)
ALT: 12 U/L (ref 0–44)
AST: 16 U/L (ref 15–41)
Albumin: 4.2 g/dL (ref 3.5–5.0)
Alkaline Phosphatase: 57 U/L (ref 38–126)
Anion gap: 6 (ref 5–15)
BUN: 13 mg/dL (ref 6–20)
CO2: 27 mmol/L (ref 22–32)
Calcium: 9.3 mg/dL (ref 8.9–10.3)
Chloride: 108 mmol/L (ref 98–111)
Creatinine: 1.22 mg/dL (ref 0.61–1.24)
GFR, Estimated: >60 mL/min (ref >60)
Glucose, Bld: 93 mg/dL (ref 70–99)
Potassium: 4 mmol/L (ref 3.5–5.1)
Sodium: 141 mmol/L (ref 135–145)
Total Bilirubin: 0.3 mg/dL (ref 0.3–1.2)
Total Protein: 6.6 g/dL (ref 6.5–8.1)

## 2020-12-12 LAB — CBC WITH DIFFERENTIAL (CANCER CENTER ONLY)
Abs Immature Granulocytes: 0.02 10*3/uL (ref 0.00–0.07)
Basophils Absolute: 0.1 10*3/uL (ref 0.0–0.1)
Basophils Relative: 1 %
Eosinophils Absolute: 0.2 10*3/uL (ref 0.0–0.5)
Eosinophils Relative: 5 %
HCT: 38.4 % — ABNORMAL LOW (ref 39.0–52.0)
Hemoglobin: 11.5 g/dL — ABNORMAL LOW (ref 13.0–17.0)
Immature Granulocytes: 0 %
Lymphocytes Relative: 28 %
Lymphs Abs: 1.3 10*3/uL (ref 0.7–4.0)
MCH: 25.5 pg — ABNORMAL LOW (ref 26.0–34.0)
MCHC: 29.9 g/dL — ABNORMAL LOW (ref 30.0–36.0)
MCV: 85.1 fL (ref 80.0–100.0)
Monocytes Absolute: 0.5 10*3/uL (ref 0.1–1.0)
Monocytes Relative: 11 %
Neutro Abs: 2.5 10*3/uL (ref 1.7–7.7)
Neutrophils Relative %: 55 %
Platelet Count: 187 10*3/uL (ref 150–400)
RBC: 4.51 MIL/uL (ref 4.22–5.81)
RDW: 18.9 % — ABNORMAL HIGH (ref 11.5–15.5)
WBC Count: 4.6 10*3/uL (ref 4.0–10.5)
nRBC: 0 % (ref 0.0–0.2)

## 2020-12-12 MED ORDER — SODIUM CHLORIDE 0.9 % IV SOLN: Freq: Once | INTRAVENOUS | Status: AC

## 2020-12-12 MED ORDER — SODIUM CHLORIDE 0.9 % IV SOLN
10.0000 mg | Freq: Once | INTRAVENOUS | Status: AC
  Administered 2020-12-12: 10 mg via INTRAVENOUS
  Filled 2020-12-12: qty 1

## 2020-12-12 MED ORDER — ATROPINE SULFATE 1 MG/ML IV SOLN
0.5000 mg | Freq: Once | INTRAVENOUS | Status: AC | PRN
  Administered 2020-12-12: 0.5 mg via INTRAVENOUS
  Filled 2020-12-12: qty 1

## 2020-12-12 MED ORDER — PALONOSETRON HCL INJECTION 0.25 MG/5ML
0.2500 mg | Freq: Once | INTRAVENOUS | Status: AC
  Administered 2020-12-12: 0.25 mg via INTRAVENOUS
  Filled 2020-12-12: qty 5

## 2020-12-12 MED ORDER — SODIUM CHLORIDE 0.9 % IV SOLN
2400.0000 mg/m2 | INTRAVENOUS | Status: DC
  Administered 2020-12-12: 4300 mg via INTRAVENOUS
  Filled 2020-12-12: qty 86

## 2020-12-12 MED ORDER — FLUOROURACIL CHEMO INJECTION 2.5 GM/50ML
400.0000 mg/m2 | Freq: Once | INTRAVENOUS | Status: AC
  Administered 2020-12-12: 700 mg via INTRAVENOUS
  Filled 2020-12-12: qty 14

## 2020-12-12 MED ORDER — SODIUM CHLORIDE 0.9 % IV SOLN
180.0000 mg/m2 | Freq: Once | INTRAVENOUS | Status: AC
  Administered 2020-12-12: 320 mg via INTRAVENOUS
  Filled 2020-12-12: qty 15

## 2020-12-12 MED ORDER — SODIUM CHLORIDE 0.9 % IV SOLN
400.0000 mg/m2 | Freq: Once | INTRAVENOUS | Status: AC
  Administered 2020-12-12: 716 mg via INTRAVENOUS
  Filled 2020-12-12: qty 35.8

## 2020-12-12 NOTE — Patient Instructions (Addendum)
[facility]  The chemotherapy medication bag should finish at 46 hours, 96 hours, or 7 days. For example, if your pump is scheduled for 46 hours and it was put on at 4:00 p.m., it should finish at 2:00 p.m. the day it is scheduled to come off regardless of your appointment time.     Estimated time to finish at  1:30 Friday, December 14, 2020.   If the display on your pump reads "Low Volume" and it is beeping, take the batteries out of the pump and come to the cancer center for it to be taken off.   If the pump alarms go off prior to the pump reading "Low Volume" then call [phone] and someone can assist you.  If the plunger comes out and the chemotherapy medication is leaking out, please use your home chemo spill kit to clean up the spill. Do NOT use paper towels or other household products.  If you have problems or questions regarding your pump, please call either [phone] (24 hours a day) or the cancer center Monday-Friday 8:00 a.m.- 4:30 p.m. at the clinic number and we will assist you. If you are unable to get assistance, then go to the nearest Emergency Department and ask the staff to contact the IV team for assistance.   Discharge Instructions: Thank you for choosing [facility] to provide your oncology and hematology care.   If you have a lab appointment with the [facility], please go directly to the [facility] and check in at the registration area.   Wear comfortable clothing and clothing appropriate for easy access to any Portacath or PICC line.   We strive to give you quality time with your provider. You may need to reschedule your appointment if you arrive late (15 or more minutes).  Arriving late affects you and other patients whose appointments are after yours.  Also, if you miss three or more appointments without notifying the office, you may be dismissed from the clinic at the provider's discretion.      For prescription  refill requests, have your pharmacy contact our office and allow 72 hours for refills to be completed.    Today you received the following chemotherapy and/or immunotherapy agents irinotecan, leucovorin, fluorouracil      To help prevent nausea and vomiting after your treatment, we encourage you to take your nausea medication as directed.  BELOW ARE SYMPTOMS THAT SHOULD BE REPORTED IMMEDIATELY: *FEVER GREATER THAN 100.4 F (38 C) OR HIGHER *CHILLS OR SWEATING *NAUSEA AND VOMITING THAT IS NOT CONTROLLED WITH YOUR NAUSEA MEDICATION *UNUSUAL SHORTNESS OF BREATH *UNUSUAL BRUISING OR BLEEDING *URINARY PROBLEMS (pain or burning when urinating, or frequent urination) *BOWEL PROBLEMS (unusual diarrhea, constipation, pain near the anus) TENDERNESS IN MOUTH AND THROAT WITH OR WITHOUT PRESENCE OF ULCERS (sore throat, sores in mouth, or a toothache) UNUSUAL RASH, SWELLING OR PAIN  UNUSUAL VAGINAL DISCHARGE OR ITCHING   Items with * indicate a potential emergency and should be followed up as soon as possible or go to the Emergency Department if any problems should occur.  Please show the CHEMOTHERAPY ALERT CARD or IMMUNOTHERAPY ALERT CARD at check-in to the Emergency Department and triage nurse.  Should you have questions after your visit or need to cancel or reschedule your appointment, please contact [facility]  Dept: [phone]  and follow the prompts.  Office hours are 8:00 a.m. to 4:30 p.m. Monday - Friday. Please note that  voicemails left after 4:00 p.m. may not be returned until the following business day.  We are closed weekends and major holidays. You have access to a nurse at all times for urgent questions. Please call the main number to the clinic Dept: [phone] and follow the prompts.   For any non-urgent questions, you may also contact your provider using MyChart. We now offer e-Visits for anyone 62 and older to request care online for non-urgent symptoms.  For details visit [email or web address].   Also download the MyChart app! Go to the app store, search "MyChart", open the app, select [facility], and log in with your MyChart username and password.  Due to Covid, a mask is required upon entering the hospital/clinic. If you do not have a mask, one will be given to you upon arrival. For doctor visits, patients may have 1 support person aged 51 or older with them. For treatment visits, patients cannot have anyone with them due to current Covid guidelines and our immunocompromised population.   Irinotecan injection What is this medication? IRINOTECAN (ir in oh TEE kan ) is a chemotherapy drug. It is used to treat colon and rectal cancer. This medicine may be used for other purposes; ask your health care provider or pharmacist if you have questions. COMMON BRAND NAME(S): Camptosar What should I tell my care team before I take this medication? They need to know if you have any of these conditions: dehydration diarrhea infection (especially a virus infection such as chickenpox, cold sores, or herpes) liver disease low blood counts, like low white cell, platelet, or red cell counts low levels of calcium, magnesium, or potassium in the blood recent or ongoing radiation therapy an unusual or allergic reaction to irinotecan, other medicines, foods, dyes, or preservatives pregnant or trying to get pregnant breast-feeding How should I use this medication? This drug is given as an infusion into a vein. It is administered in a hospital or clinic by a specially trained health care professional. Talk to your pediatrician regarding the use of this medicine in children. Special care may be needed. Overdosage: If you think you have taken too much of this medicine contact a poison control center or emergency room at once. NOTE: This medicine is only for you. Do not share this medicine with others. What if I miss a dose? It is important not to miss your  dose. Call your doctor or health care professional if you are unable to keep an appointment. What may interact with this medication? Do not take this medicine with any of the following medications: cobicistat itraconazole This medicine may interact with the following medications: antiviral medicines for HIV or AIDS certain antibiotics like rifampin or rifabutin certain medicines for fungal infections like ketoconazole, posaconazole, and voriconazole certain medicines for seizures like carbamazepine, phenobarbital, phenotoin clarithromycin gemfibrozil nefazodone [facility]. John's Wort This list may not describe all possible interactions. Give your health care provider a list of all the medicines, herbs, non-prescription drugs, or dietary supplements you use. Also tell them if you smoke, drink alcohol, or use illegal drugs. Some items may interact with your medicine. What should I watch for while using this medication? Your condition will be monitored carefully while you are receiving this medicine. You will need important blood work done while you are taking this medicine. This drug may make you feel generally unwell. This is not uncommon, as chemotherapy can affect healthy cells as well as cancer cells. Report any side effects. Continue your course of treatment even though  you feel ill unless your doctor tells you to stop. In some cases, you may be given additional medicines to help with side effects. Follow all directions for their use. You may get drowsy or dizzy. Do not drive, use machinery, or do anything that needs mental alertness until you know how this medicine affects you. Do not stand or sit up quickly, especially if you are an older patient. This reduces the risk of dizzy or fainting spells. Call your health care professional for advice if you get a fever, chills, or sore throat, or other symptoms of a cold or flu. Do not treat yourself. This medicine decreases your body's ability to fight  infections. Try to avoid being around people who are sick. Avoid taking products that contain aspirin, acetaminophen, ibuprofen, naproxen, or ketoprofen unless instructed by your doctor. These medicines may hide a fever. This medicine may increase your risk to bruise or bleed. Call your doctor or health care professional if you notice any unusual bleeding. Be careful brushing and flossing your teeth or using a toothpick because you may get an infection or bleed more easily. If you have any dental work done, tell your dentist you are receiving this medicine. Do not become pregnant while taking this medicine or for 6 months after stopping it. Women should inform their health care professional if they wish to become pregnant or think they might be pregnant. Men should not father a child while taking this medicine and for 3 months after stopping it. There is potential for serious side effects to an unborn child. Talk to your health care professional for more information. Do not breast-feed an infant while taking this medicine or for 7 days after stopping it. This medicine has caused ovarian failure in some women. This medicine may make it more difficult to get pregnant. Talk to your health care professional if you are concerned about your fertility. This medicine has caused decreased sperm counts in some men. This may make it more difficult to father a child. Talk to your health care professional if you are concerned about your fertility. What side effects may I notice from receiving this medication? Side effects that you should report to your doctor or health care professional as soon as possible: allergic reactions like skin rash, itching or hives, swelling of the face, lips, or tongue chest pain diarrhea flushing, runny nose, sweating during infusion low blood counts - this medicine may decrease the number of white blood cells, red blood cells and platelets. You may be at increased risk for infections  and bleeding. nausea, vomiting pain, swelling, warmth in the leg signs of decreased platelets or bleeding - bruising, pinpoint red spots on the skin, black, tarry stools, blood in the urine signs of infection - fever or chills, cough, sore throat, pain or difficulty passing urine signs of decreased red blood cells - unusually weak or tired, fainting spells, lightheadedness Side effects that usually do not require medical attention (report to your doctor or health care professional if they continue or are bothersome): constipation hair loss headache loss of appetite mouth sores stomach pain This list may not describe all possible side effects. Call your doctor for medical advice about side effects. You may report side effects to FDA at 1-800-FDA-1088. Where should I keep my medication? This drug is given in a hospital or clinic and will not be stored at home. NOTE: This sheet is a summary. It may not cover all possible information. If you have questions about this  medicine, talk to your doctor, pharmacist, or health care provider.  2022 Elsevier/Gold Standard (2020-09-18 00:00:00)  Leucovorin injection What is this medication? LEUCOVORIN (loo koe VOR in) is used to prevent or treat the harmful effects of some medicines. This medicine is used to treat anemia caused by a low amount of folic acid in the body. It is also used with 5-fluorouracil (5-FU) to treat colon cancer. This medicine may be used for other purposes; ask your health care provider or pharmacist if you have questions. What should I tell my care team before I take this medication? They need to know if you have any of these conditions: anemia from low levels of vitamin B-12 in the blood an unusual or allergic reaction to leucovorin, folic acid, other medicines, foods, dyes, or preservatives pregnant or trying to get pregnant breast-feeding How should I use this medication? This medicine is for injection into a muscle or into  a vein. It is given by a health care professional in a hospital or clinic setting. Talk to your pediatrician regarding the use of this medicine in children. Special care may be needed. Overdosage: If you think you have taken too much of this medicine contact a poison control center or emergency room at once. NOTE: This medicine is only for you. Do not share this medicine with others. What if I miss a dose? This does not apply. What may interact with this medication? capecitabine fluorouracil phenobarbital phenytoin primidone trimethoprim-sulfamethoxazole This list may not describe all possible interactions. Give your health care provider a list of all the medicines, herbs, non-prescription drugs, or dietary supplements you use. Also tell them if you smoke, drink alcohol, or use illegal drugs. Some items may interact with your medicine. What should I watch for while using this medication? Your condition will be monitored carefully while you are receiving this medicine. This medicine may increase the side effects of 5-fluorouracil, 5-FU. Tell your doctor or health care professional if you have diarrhea or mouth sores that do not get better or that get worse. What side effects may I notice from receiving this medication? Side effects that you should report to your doctor or health care professional as soon as possible: allergic reactions like skin rash, itching or hives, swelling of the face, lips, or tongue breathing problems fever, infection mouth sores unusual bleeding or bruising unusually weak or tired Side effects that usually do not require medical attention (report to your doctor or health care professional if they continue or are bothersome): constipation or diarrhea loss of appetite nausea, vomiting This list may not describe all possible side effects. Call your doctor for medical advice about side effects. You may report side effects to FDA at 1-800-FDA-1088. Where should I keep  my medication? This drug is given in a hospital or clinic and will not be stored at home. NOTE: This sheet is a summary. It may not cover all possible information. If you have questions about this medicine, talk to your doctor, pharmacist, or health care provider.  2022 Elsevier/Gold Standard (2007-07-08 00:00:00)  Fluorouracil, 5-FU injection What is this medication? FLUOROURACIL, 5-FU (flure oh YOOR a sil) is a chemotherapy drug. It slows the growth of cancer cells. This medicine is used to treat many types of cancer like breast cancer, colon or rectal cancer, pancreatic cancer, and stomach cancer. This medicine may be used for other purposes; ask your health care provider or pharmacist if you have questions. COMMON BRAND NAME(S): Adrucil What should I tell my care  team before I take this medication? They need to know if you have any of these conditions: blood disorders dihydropyrimidine dehydrogenase (DPD) deficiency infection (especially a virus infection such as chickenpox, cold sores, or herpes) kidney disease liver disease malnourished, poor nutrition recent or ongoing radiation therapy an unusual or allergic reaction to fluorouracil, other chemotherapy, other medicines, foods, dyes, or preservatives pregnant or trying to get pregnant breast-feeding How should I use this medication? This drug is given as an infusion or injection into a vein. It is administered in a hospital or clinic by a specially trained health care professional. Talk to your pediatrician regarding the use of this medicine in children. Special care may be needed. Overdosage: If you think you have taken too much of this medicine contact a poison control center or emergency room at once. NOTE: This medicine is only for you. Do not share this medicine with others. What if I miss a dose? It is important not to miss your dose. Call your doctor or health care professional if you are unable to keep an  appointment. What may interact with this medication? Do not take this medicine with any of the following medications: live virus vaccines This medicine may also interact with the following medications: medicines that treat or prevent blood clots like warfarin, enoxaparin, and dalteparin This list may not describe all possible interactions. Give your health care provider a list of all the medicines, herbs, non-prescription drugs, or dietary supplements you use. Also tell them if you smoke, drink alcohol, or use illegal drugs. Some items may interact with your medicine. What should I watch for while using this medication? Visit your doctor for checks on your progress. This drug may make you feel generally unwell. This is not uncommon, as chemotherapy can affect healthy cells as well as cancer cells. Report any side effects. Continue your course of treatment even though you feel ill unless your doctor tells you to stop. In some cases, you may be given additional medicines to help with side effects. Follow all directions for their use. Call your doctor or health care professional for advice if you get a fever, chills or sore throat, or other symptoms of a cold or flu. Do not treat yourself. This drug decreases your body's ability to fight infections. Try to avoid being around people who are sick. This medicine may increase your risk to bruise or bleed. Call your doctor or health care professional if you notice any unusual bleeding. Be careful brushing and flossing your teeth or using a toothpick because you may get an infection or bleed more easily. If you have any dental work done, tell your dentist you are receiving this medicine. Avoid taking products that contain aspirin, acetaminophen, ibuprofen, naproxen, or ketoprofen unless instructed by your doctor. These medicines may hide a fever. Do not become pregnant while taking this medicine. Women should inform their doctor if they wish to become pregnant  or think they might be pregnant. There is a potential for serious side effects to an unborn child. Talk to your health care professional or pharmacist for more information. Do not breast-feed an infant while taking this medicine. Men should inform their doctor if they wish to father a child. This medicine may lower sperm counts. Do not treat diarrhea with over the counter products. Contact your doctor if you have diarrhea that lasts more than 2 days or if it is severe and watery. This medicine can make you more sensitive to the sun. Keep out of  the sun. If you cannot avoid being in the sun, wear protective clothing and use sunscreen. Do not use sun lamps or tanning beds/booths. What side effects may I notice from receiving this medication? Side effects that you should report to your doctor or health care professional as soon as possible: allergic reactions like skin rash, itching or hives, swelling of the face, lips, or tongue low blood counts - this medicine may decrease the number of white blood cells, red blood cells and platelets. You may be at increased risk for infections and bleeding. signs of infection - fever or chills, cough, sore throat, pain or difficulty passing urine signs of decreased platelets or bleeding - bruising, pinpoint red spots on the skin, black, tarry stools, blood in the urine signs of decreased red blood cells - unusually weak or tired, fainting spells, lightheadedness breathing problems changes in vision chest pain mouth sores nausea and vomiting pain, swelling, redness at site where injected pain, tingling, numbness in the hands or feet redness, swelling, or sores on hands or feet stomach pain unusual bleeding Side effects that usually do not require medical attention (report to your doctor or health care professional if they continue or are bothersome): changes in finger or toe nails diarrhea dry or itchy skin hair loss headache loss of appetite sensitivity  of eyes to the light stomach upset unusually teary eyes This list may not describe all possible side effects. Call your doctor for medical advice about side effects. You may report side effects to FDA at 1-800-FDA-1088. Where should I keep my medication? This drug is given in a hospital or clinic and will not be stored at home. NOTE: This sheet is a summary. It may not cover all possible information. If you have questions about this medicine, talk to your doctor, pharmacist, or health care provider.  2022 Elsevier/Gold Standard (2020-09-18 00:00:00)

## 2020-12-12 NOTE — Progress Notes (Signed)
Patient presents for treatment. RN assessment completed along with the following:  Labs/vitals reviewed - Yes, and within treatment parameters.   Weight within 10% of previous measurement - Yes Oncology Treatment Attestation completed for current therapy- Yes, on date 10/21/19 Informed consent completed and reflects current therapy/intent - Yes, on date 11/01/19             Provider progress note reviewed - Today's provider note is not yet available. I reviewed the most recent oncology provider progress note in chart dated 11/20/20. Treatment/Antibody/Supportive plan reviewed - Yes, and there are no adjustments needed for today's treatment. S&H and other orders reviewed - Yes, and there are no additional orders identified. Previous treatment date reviewed - Yes, and the appropriate amount of time has elapsed between treatments. Clinic Hand Off Received from - Cy Blamer, RN  Patient to proceed with treatment.

## 2020-12-12 NOTE — Progress Notes (Signed)
[place] OFFICE PROGRESS NOTE   Diagnosis: Colon cancer  INTERVAL HISTORY:   Alexander Reeves returns as scheduled.  He completed another cycle of FOLFIRI 11/20/2020.  No recent nausea/vomiting.  No mouth sores.  No diarrhea.  He has intermittent tingling in the hands and feet.  He feels that his feet are swollen.  Objective:  Vital signs in last 24 hours:  Blood pressure 120/81, pulse 91, temperature 97.9 F (36.6 C), temperature source Oral, resp. rate 18, height '5\' 10"'  (1.778 m), weight 154 lb (69.9 kg), SpO2 100 %.    HEENT: No thrush or ulcers.  Scattered areas of hyperpigmentation of the buccal mucosa. Resp: Lungs clear bilaterally. Cardio: Regular rate and rhythm. GI: No hepatosplenomegaly. Vascular: No leg edema.  No apparent foot edema. Skin: Palms are dry appearing. Port-A-Cath without erythema.   Lab Results:  Lab Results  Component Value Date   WBC 4.6 12/12/2020   HGB 11.5 (L) 12/12/2020   HCT 38.4 (L) 12/12/2020   MCV 85.1 12/12/2020   PLT 187 12/12/2020   NEUTROABS 2.5 12/12/2020    Imaging:  No results found.  Medications: I have reviewed the patient's current medications.  Assessment/Plan: Moderately differentiated adenocarcinoma ascending colon, stage IIIb (pT3pN1), status post a right colectomy 11/19/2018 Lymphovascular and perineural invasion present, 2/14 lymph nodes positive, tumor deposits present Positive radial margin, no loss of mismatch repair protein expression, discussed case with Dr. Craig Reeves mass with surrounding inflammation at multiple margins, no gross residual disease, unclear which is the "positive "radial margin, further surgery and radiation not recommended Colonoscopy 11/19/2018-completely obstructing mid ascending colon mass, could not be passed with endoscope, biopsy confirmed invasive adenocarcinoma CT abdomen/pelvis 11/18/2018-wall thickening at the mid and distal ascending colon with mild distention of  the proximal ascending colon and cecum CTs 10/17/2018--no acute findings, no chest lymphadenopathy, lungs clear Cycle 1 FOLFOX 12/28/2018 Cycle 2 FOLFOX 01/11/2019, Udenyca added  Cycle 3 FOLFOX 01/25/2019, Udenyca held due to bone pain Cycle 4 FOLFOX 02/21/2019, Udenyca added Cycle 5 FOLFOX 03/09/2019, Udenyca Cycle 6 FOLFOX 03/21/2019, Udenyca Cycle 7 FOLFOX 04/04/2019, Udenyca Cycle 8 FOLFOX 04/18/2019, Udenyca  Cycle 9 FOLFOX 05/02/2019, Udenyca Cycle 10 FOLFOX 05/16/2019, oxaliplatin, 5-FU bolus, and Udenyca held Cycle 11 FOLFOX 05/30/2019, oxaliplatin, 5-FU bolus, and Udenyca held Cycle 12 FOLFOX 06/15/2019, oxaliplatin, 5-FU bolus and Udenyca held CTs 09/29/2019-new hypodense enhancing liver masses consistent with metastatic disease, indistinct marginated nodularity below the pancreas head-likely small lymph nodes Biopsy liver lesion 10/18/2019-adenocarcinoma consistent with history of colorectal carcinoma Cycle 1 FOLFIRI/Avastin 11/01/2019 Cycle 2 FOLFIRI/Avastin 11/15/2019 Cycle 3 FOLFIRI/Avastin 11/29/2019 Cycle 4 FOLFIRI/Avastin 12/13/2019 CT chest 12/27/2019-no significant change in hepatic metastases, submassive pulmonary emboli Cycle 5 FOLFIRI 01/10/2020 (Avastin discontinued) Cycle 6 FOLFIRI 02/01/2020 Cycle 7 FOLFIRI 02/13/2020 Cycle 8 FOLFIRI 02/27/2020 CTs abdomen/pelvis 03/09/2020-per Dr. Gearldine Reeves review overall stable disease Cycle 9 FOLFIRI 04/16/2020 Cycle 10 FOLFIRI 05/01/2020 Cycle 11 FOLFIRI 05/15/2020 Cycle 12 FOLFIRI 05/29/2020 CT abdomen/pelvis 06/07/2020- decreased size of liver lesions, no new lesions Cycle 13 FOLFIRI 06/12/2020 Cycle 14 FOLFIRI 07/10/2020 Cycle 15 FOLFIRI 07/24/2020 Cycle 16 FOLFIRI 08/07/2020 Cycle 17 FOLFIRI 08/21/2020 Cycle 18 FOLFIRI 09/04/2020 CT abdomen/pelvis 09/14/2020-stable faint liver lesions, no new lesions, no new site of metastatic disease Cycle 19 09/18/2020  Treatment held 10/09/2020 due to upper respiratory symptoms, known COVID exposure Cycle 20  FOLFIRI 10/30/2020 Cycle 21 FOLFIRI 11/20/2020 Cycle 22 FOLFIRI 12/12/2020   Deaf Right epididymal cyst removal 03/22/2018 Asthma Port-A-Cath placement, Dr. Donne Reeves, 12/23/2018 Neutropenia secondary to chemotherapy-Udenyca added for  cycle 2 FOLFOX Admission with febrile neutropenia 02/08/2019 Hospital admission with submassive PE and left lower extremity DVT on 12/27/2019, heparin, transition to Xarelto 12/28/2019 CT chest 12/27/2019-submassive pulmonary emboli with evidence of right heart strain, no significant change in hepatic metastases      Disposition: Alexander Reeves appears unchanged.  He has completed 21 cycles of FOLFIRI.  Treatment is being given on a 3-week schedule.  Overall he is tolerating well.  Plan to proceed with cycle 22 today as scheduled.  We reviewed the CBC and chemistry panel from today.  Labs adequate to proceed with FOLFIRI.  He will return for lab, follow-up, FOLFIRI on 01/01/2021.  We are available to see him sooner if needed.    Ned Card ANP/GNP-BC   12/12/2020  11:24 AM

## 2020-12-14 ENCOUNTER — Other Ambulatory Visit: Payer: Self-pay | Admitting: Nurse Practitioner

## 2020-12-14 ENCOUNTER — Inpatient Hospital Stay: Payer: Medicare Other | Attending: Nurse Practitioner

## 2020-12-14 ENCOUNTER — Other Ambulatory Visit: Payer: Self-pay

## 2020-12-14 VITALS — BP 103/69 | HR 103 | Temp 99.8°F

## 2020-12-14 DIAGNOSIS — D701 Agranulocytosis secondary to cancer chemotherapy: Secondary | ICD-10-CM | POA: Insufficient documentation

## 2020-12-14 DIAGNOSIS — Z95828 Presence of other vascular implants and grafts: Secondary | ICD-10-CM

## 2020-12-14 DIAGNOSIS — T451X5A Adverse effect of antineoplastic and immunosuppressive drugs, initial encounter: Secondary | ICD-10-CM | POA: Diagnosis not present

## 2020-12-14 DIAGNOSIS — Z5111 Encounter for antineoplastic chemotherapy: Secondary | ICD-10-CM | POA: Insufficient documentation

## 2020-12-14 DIAGNOSIS — C787 Secondary malignant neoplasm of liver and intrahepatic bile duct: Secondary | ICD-10-CM | POA: Insufficient documentation

## 2020-12-14 DIAGNOSIS — C189 Malignant neoplasm of colon, unspecified: Secondary | ICD-10-CM

## 2020-12-14 DIAGNOSIS — C182 Malignant neoplasm of ascending colon: Secondary | ICD-10-CM | POA: Insufficient documentation

## 2020-12-14 MED ORDER — HEPARIN SOD (PORK) LOCK FLUSH 100 UNIT/ML IV SOLN
500.0000 [IU] | Freq: Once | INTRAVENOUS | Status: AC | PRN
  Administered 2020-12-14: 500 [IU]

## 2020-12-14 MED ORDER — HEPARIN SOD (PORK) LOCK FLUSH 100 UNIT/ML IV SOLN: 500.0000 [IU] | Freq: Once | INTRAVENOUS | Status: DC | PRN

## 2020-12-14 MED ORDER — PEGFILGRASTIM-CBQV 6 MG/0.6ML ~~LOC~~ SOSY
6.0000 mg | PREFILLED_SYRINGE | Freq: Once | SUBCUTANEOUS | Status: AC
  Administered 2020-12-14: 6 mg via SUBCUTANEOUS

## 2020-12-14 MED ORDER — SODIUM CHLORIDE 0.9% FLUSH
10.0000 mL | INTRAVENOUS | Status: DC | PRN
  Administered 2020-12-14: 10 mL

## 2020-12-14 MED ORDER — SODIUM CHLORIDE 0.9% FLUSH: 10.0000 mL | INTRAVENOUS | Status: DC | PRN

## 2020-12-14 NOTE — Patient Instructions (Signed)
[facility]  Discharge Instructions: Thank you for choosing [facility] to provide your oncology and hematology care.   If you have a lab appointment with the Cancer Center, please go directly to the Cancer Center and check in at the registration area.   Wear comfortable clothing and clothing appropriate for easy access to any Portacath or PICC line.   We strive to give you quality time with your provider. You may need to reschedule your appointment if you arrive late (15 or more minutes).  Arriving late affects you and other patients whose appointments are after yours.  Also, if you miss three or more appointments without notifying the office, you may be dismissed from the clinic at the provider's discretion.      For prescription refill requests, have your pharmacy contact our office and allow 72 hours for refills to be completed.    Today you received the following Udenyca    To help prevent nausea and vomiting after your treatment, we encourage you to take your nausea medication as directed.  BELOW ARE SYMPTOMS THAT SHOULD BE REPORTED IMMEDIATELY: *FEVER GREATER THAN 100.4 F (38 C) OR HIGHER *CHILLS OR SWEATING *NAUSEA AND VOMITING THAT IS NOT CONTROLLED WITH YOUR NAUSEA MEDICATION *UNUSUAL SHORTNESS OF BREATH *UNUSUAL BRUISING OR BLEEDING *URINARY PROBLEMS (pain or burning when urinating, or frequent urination) *BOWEL PROBLEMS (unusual diarrhea, constipation, pain near the anus) TENDERNESS IN MOUTH AND THROAT WITH OR WITHOUT PRESENCE OF ULCERS (sore throat, sores in mouth, or a toothache) UNUSUAL RASH, SWELLING OR PAIN  UNUSUAL VAGINAL DISCHARGE OR ITCHING   Items with * indicate a potential emergency and should be followed up as soon as possible or go to the Emergency Department if any problems should occur.  Please show the CHEMOTHERAPY ALERT CARD or IMMUNOTHERAPY ALERT CARD at check-in to the Emergency Department and triage  nurse.  Should you have questions after your visit or need to cancel or reschedule your appointment, please contact [facility]  Dept: [phone]  and follow the prompts.  Office hours are 8:00 a.m. to 4:30 p.m. Monday - Friday. Please note that voicemails left after 4:00 p.m. may not be returned until the following business day.  We are closed weekends and major holidays. You have access to a nurse at all times for urgent questions. Please call the main number to the clinic Dept: [phone] and follow the prompts.   For any non-urgent questions, you may also contact your provider using MyChart. We now offer e-Visits for anyone 18 and older to request care online for non-urgent symptoms. For details visit [email or web address].   Also download the MyChart app! Go to the app store, search "MyChart", open the app, select [facility], and log in with your MyChart username and password.  Due to Covid, a mask is required upon entering the hospital/clinic. If you do not have a mask, one will be given to you upon arrival. For doctor visits, patients may have 1 support person aged 18 or older with them. For treatment visits, patients cannot have anyone with them due to current Covid guidelines and our immunocompromised population.   Pegfilgrastim Injection What is this medication? PEGFILGRASTIM (PEG fil gra stim) lowers the risk of infection in people who are receiving chemotherapy. It works by helping your body make more white blood cells, which protects your body from infection. It may also be used to help people who have been exposed to high doses of   radiation. This medicine may be used for other purposes; ask your health care provider or pharmacist if you have questions. COMMON BRAND NAME(S): Fulphila, Neulasta, Nyvepria, UDENYCA, Ziextenzo What should I tell my care team before I take this medication? They need to know if you have any of these conditions: Kidney  disease Latex allergy Ongoing radiation therapy Sickle cell disease Skin reactions to acrylic adhesives (On-Body Injector only) An unusual or allergic reaction to pegfilgrastim, filgrastim, other medications, foods, dyes, or preservatives Pregnant or trying to get pregnant Breast-feeding How should I use this medication? This medication is for injection under the skin. If you get this medication at home, you will be taught how to prepare and give the pre-filled syringe or how to use the On-body Injector. Refer to the patient Instructions for Use for detailed instructions. Use exactly as directed. Tell your care team immediately if you suspect that the On-body Injector may not have performed as intended or if you suspect the use of the On-body Injector resulted in a missed or partial dose. It is important that you put your used needles and syringes in a special sharps container. Do not put them in a trash can. If you do not have a sharps container, call your pharmacist or care team to get one. Talk to your care team about the use of this medication in children. While this medication may be prescribed for selected conditions, precautions do apply. Overdosage: If you think you have taken too much of this medicine contact a poison control center or emergency room at once. NOTE: This medicine is only for you. Do not share this medicine with others. What if I miss a dose? It is important not to miss your dose. Call your care team if you miss your dose. If you miss a dose due to an On-body Injector failure or leakage, a new dose should be administered as soon as possible using a single prefilled syringe for manual use. What may interact with this medication? Interactions have not been studied. This list may not describe all possible interactions. Give your health care provider a list of all the medicines, herbs, non-prescription drugs, or dietary supplements you use. Also tell them if you smoke, drink  alcohol, or use illegal drugs. Some items may interact with your medicine. What should I watch for while using this medication? Your condition will be monitored carefully while you are receiving this medication. You may need blood work done while you are taking this medication. Talk to your care team about your risk of cancer. You may be more at risk for certain types of cancer if you take this medication. If you are going to need a MRI, CT scan, or other procedure, tell your care team that you are using this medication (On-Body Injector only). What side effects may I notice from receiving this medication? Side effects that you should report to your care team as soon as possible: Allergic reactions--skin rash, itching, hives, swelling of the face, lips, tongue, or throat Capillary leak syndrome--stomach or muscle pain, unusual weakness or fatigue, feeling faint or lightheaded, decrease in the amount of urine, swelling of the ankles, hands, or feet, trouble breathing High white blood cell level--fever, fatigue, trouble breathing, night sweats, change in vision, weight loss Inflammation of the aorta--fever, fatigue, back, chest, or stomach pain, severe headache Kidney injury (glomerulonephritis)--decrease in the amount of urine, red or dark brown urine, foamy or bubbly urine, swelling of the ankles, hands, or feet Shortness of breath or   trouble breathing Spleen injury--pain in upper left stomach or shoulder Unusual bruising or bleeding Side effects that usually do not require medical attention (report to your care team if they continue or are bothersome): Bone pain Pain in the hands or feet This list may not describe all possible side effects. Call your doctor for medical advice about side effects. You may report side effects to FDA at 1-800-FDA-1088. Where should I keep my medication? Keep out of the reach of children. If you are using this medication at home, you will be instructed on how to  store it. Throw away any unused medication after the expiration date on the label. NOTE: This sheet is a summary. It may not cover all possible information. If you have questions about this medicine, talk to your doctor, pharmacist, or health care provider.  2022 Elsevier/Gold Standard (2020-09-18 00:00:00)  

## 2020-12-19 DIAGNOSIS — H903 Sensorineural hearing loss, bilateral: Secondary | ICD-10-CM | POA: Diagnosis not present

## 2020-12-19 DIAGNOSIS — Z789 Other specified health status: Secondary | ICD-10-CM | POA: Diagnosis not present

## 2020-12-25 ENCOUNTER — Telehealth: Payer: Self-pay

## 2020-12-25 NOTE — Telephone Encounter (Signed)
TC to the pt to let him know he is schedule to be up at 900 on 01-01-21 for his appointment

## 2020-12-28 ENCOUNTER — Other Ambulatory Visit (INDEPENDENT_AMBULATORY_CARE_PROVIDER_SITE_OTHER): Payer: Self-pay | Admitting: Primary Care

## 2020-12-28 ENCOUNTER — Telehealth (INDEPENDENT_AMBULATORY_CARE_PROVIDER_SITE_OTHER): Payer: Self-pay

## 2020-12-28 DIAGNOSIS — J45909 Unspecified asthma, uncomplicated: Secondary | ICD-10-CM

## 2020-12-28 NOTE — Telephone Encounter (Signed)
Copied from [place] [id number]. Topic: General - Other >> Dec 28, 2020 10:38 AM Yvette Rack wrote: Reason for CRM: Pt requests a 90 day supply for the Rx for albuterol (VENTOLIN HFA) 108 (90 Base) MCG/ACT inhaler. Pt also asked that once the Rx is approved to please have his pharmacy contact him when it is ready for pick up.

## 2020-12-30 ENCOUNTER — Other Ambulatory Visit (INDEPENDENT_AMBULATORY_CARE_PROVIDER_SITE_OTHER): Payer: Self-pay | Admitting: Primary Care

## 2020-12-30 ENCOUNTER — Other Ambulatory Visit: Payer: Self-pay | Admitting: Oncology

## 2020-12-30 DIAGNOSIS — J45909 Unspecified asthma, uncomplicated: Secondary | ICD-10-CM

## 2020-12-30 MED ORDER — VENTOLIN HFA 108 (90 BASE) MCG/ACT IN AERS
INHALATION_SPRAY | RESPIRATORY_TRACT | 3 refills | Status: DC
Start: 2020-12-30 — End: 2021-04-15

## 2021-01-01 ENCOUNTER — Other Ambulatory Visit: Payer: Self-pay

## 2021-01-01 ENCOUNTER — Inpatient Hospital Stay: Payer: Medicare Other

## 2021-01-01 ENCOUNTER — Encounter: Payer: Self-pay | Admitting: Nutrition

## 2021-01-01 ENCOUNTER — Inpatient Hospital Stay (HOSPITAL_BASED_OUTPATIENT_CLINIC_OR_DEPARTMENT_OTHER): Payer: Medicare Other | Admitting: Oncology

## 2021-01-01 ENCOUNTER — Telehealth: Payer: Self-pay

## 2021-01-01 ENCOUNTER — Encounter: Payer: Self-pay | Admitting: *Deleted

## 2021-01-01 VITALS — BP 117/84 | HR 68 | Temp 97.6°F | Resp 18

## 2021-01-01 VITALS — BP 113/96 | HR 99 | Temp 98.3°F | Resp 18 | Ht 70.0 in | Wt 153.0 lb

## 2021-01-01 DIAGNOSIS — C182 Malignant neoplasm of ascending colon: Secondary | ICD-10-CM

## 2021-01-01 DIAGNOSIS — Z5111 Encounter for antineoplastic chemotherapy: Secondary | ICD-10-CM | POA: Diagnosis not present

## 2021-01-01 DIAGNOSIS — T451X5A Adverse effect of antineoplastic and immunosuppressive drugs, initial encounter: Secondary | ICD-10-CM | POA: Diagnosis not present

## 2021-01-01 DIAGNOSIS — D701 Agranulocytosis secondary to cancer chemotherapy: Secondary | ICD-10-CM | POA: Diagnosis not present

## 2021-01-01 DIAGNOSIS — Z23 Encounter for immunization: Secondary | ICD-10-CM

## 2021-01-01 DIAGNOSIS — C787 Secondary malignant neoplasm of liver and intrahepatic bile duct: Secondary | ICD-10-CM | POA: Diagnosis not present

## 2021-01-01 DIAGNOSIS — C189 Malignant neoplasm of colon, unspecified: Secondary | ICD-10-CM

## 2021-01-01 LAB — CBC WITH DIFFERENTIAL (CANCER CENTER ONLY)
Abs Immature Granulocytes: 0.04 10*3/uL (ref 0.00–0.07)
Basophils Absolute: 0.1 10*3/uL (ref 0.0–0.1)
Basophils Relative: 1 %
Eosinophils Absolute: 0.2 10*3/uL (ref 0.0–0.5)
Eosinophils Relative: 4 %
HCT: 39.7 % (ref 39.0–52.0)
Hemoglobin: 12 g/dL — ABNORMAL LOW (ref 13.0–17.0)
Immature Granulocytes: 1 %
Lymphocytes Relative: 37 %
Lymphs Abs: 2.1 10*3/uL (ref 0.7–4.0)
MCH: 25.3 pg — ABNORMAL LOW (ref 26.0–34.0)
MCHC: 30.2 g/dL (ref 30.0–36.0)
MCV: 83.6 fL (ref 80.0–100.0)
Monocytes Absolute: 0.5 10*3/uL (ref 0.1–1.0)
Monocytes Relative: 9 %
Neutro Abs: 2.7 10*3/uL (ref 1.7–7.7)
Neutrophils Relative %: 48 %
Platelet Count: 233 10*3/uL (ref 150–400)
RBC: 4.75 MIL/uL (ref 4.22–5.81)
RDW: 18.4 % — ABNORMAL HIGH (ref 11.5–15.5)
WBC Count: 5.6 10*3/uL (ref 4.0–10.5)
nRBC: 0 % (ref 0.0–0.2)

## 2021-01-01 LAB — CMP (CANCER CENTER ONLY)
ALT: 13 U/L (ref 0–44)
AST: 15 U/L (ref 15–41)
Albumin: 4.1 g/dL (ref 3.5–5.0)
Alkaline Phosphatase: 72 U/L (ref 38–126)
Anion gap: 7 (ref 5–15)
BUN: 17 mg/dL (ref 6–20)
CO2: 27 mmol/L (ref 22–32)
Calcium: 9.2 mg/dL (ref 8.9–10.3)
Chloride: 107 mmol/L (ref 98–111)
Creatinine: 1.34 mg/dL — ABNORMAL HIGH (ref 0.61–1.24)
GFR, Estimated: >60 mL/min (ref >60)
Glucose, Bld: 92 mg/dL (ref 70–99)
Potassium: 3.8 mmol/L (ref 3.5–5.1)
Sodium: 141 mmol/L (ref 135–145)
Total Bilirubin: 0.3 mg/dL (ref 0.3–1.2)
Total Protein: 6.9 g/dL (ref 6.5–8.1)

## 2021-01-01 MED ORDER — SODIUM CHLORIDE 0.9 % IV SOLN
10.0000 mg | Freq: Once | INTRAVENOUS | Status: AC
  Administered 2021-01-01: 12:00:00 10 mg via INTRAVENOUS
  Filled 2021-01-01: qty 1

## 2021-01-01 MED ORDER — FLUOROURACIL CHEMO INJECTION 2.5 GM/50ML
400.0000 mg/m2 | Freq: Once | INTRAVENOUS | Status: AC
  Administered 2021-01-01: 15:00:00 700 mg via INTRAVENOUS
  Filled 2021-01-01: qty 14

## 2021-01-01 MED ORDER — GUAIFENESIN 100 MG/5ML PO LIQD: 5.0000 mL | ORAL | 0 refills | Status: AC | PRN

## 2021-01-01 MED ORDER — SODIUM CHLORIDE 0.9 % IV SOLN: Freq: Once | INTRAVENOUS | Status: AC

## 2021-01-01 MED ORDER — SODIUM CHLORIDE 0.9 % IV SOLN
400.0000 mg/m2 | Freq: Once | INTRAVENOUS | Status: AC
  Administered 2021-01-01: 13:00:00 716 mg via INTRAVENOUS
  Filled 2021-01-01: qty 25

## 2021-01-01 MED ORDER — SODIUM CHLORIDE 0.9 % IV SOLN
180.0000 mg/m2 | Freq: Once | INTRAVENOUS | Status: AC
  Administered 2021-01-01: 13:00:00 320 mg via INTRAVENOUS
  Filled 2021-01-01: qty 15

## 2021-01-01 MED ORDER — ATROPINE SULFATE 1 MG/ML IV SOLN
0.5000 mg | Freq: Once | INTRAVENOUS | Status: AC | PRN
  Administered 2021-01-01: 13:00:00 0.5 mg via INTRAVENOUS
  Filled 2021-01-01: qty 1

## 2021-01-01 MED ORDER — LIDOCAINE-PRILOCAINE 2.5-2.5 % EX CREA: 1.0000 "application " | TOPICAL_CREAM | CUTANEOUS | 2 refills | Status: AC | PRN

## 2021-01-01 MED ORDER — INFLUENZA VAC SPLIT QUAD 0.5 ML IM SUSY: 0.5000 mL | PREFILLED_SYRINGE | Freq: Once | INTRAMUSCULAR | Status: DC

## 2021-01-01 MED ORDER — SODIUM CHLORIDE 0.9 % IV SOLN
150.0000 mg | Freq: Once | INTRAVENOUS | Status: AC
  Administered 2021-01-01: 12:00:00 150 mg via INTRAVENOUS
  Filled 2021-01-01: qty 5

## 2021-01-01 MED ORDER — PALONOSETRON HCL INJECTION 0.25 MG/5ML
0.2500 mg | Freq: Once | INTRAVENOUS | Status: AC
  Administered 2021-01-01: 12:00:00 0.25 mg via INTRAVENOUS
  Filled 2021-01-01: qty 5

## 2021-01-01 MED ORDER — SODIUM CHLORIDE 0.9 % IV SOLN
2400.0000 mg/m2 | INTRAVENOUS | Status: DC
  Administered 2021-01-01: 15:00:00 4300 mg via INTRAVENOUS
  Filled 2021-01-01: qty 86

## 2021-01-01 NOTE — Progress Notes (Signed)
Patient given one case of ensure plus by Merceda Elks.

## 2021-01-01 NOTE — Progress Notes (Signed)
Patient was provided one case of Ensure at visit today.

## 2021-01-01 NOTE — Progress Notes (Signed)
[place] OFFICE PROGRESS NOTE   Diagnosis: Colon cancer  INTERVAL HISTORY:   Mr. Glazier completed on cycle FOLFIRI on 12/12/2020.  He is here today with a sign language interpreter.  He reports nausea on day 3.  He had excessive saliva lasting for several days following chemotherapy.  No diarrhea or mouth sores.  He has occasional rectal bleeding when wiping.  Objective:  Vital signs in last 24 hours:  Blood pressure (!) 113/96, pulse 99, temperature 98.3 F (36.8 C), temperature source Oral, resp. rate 18, height '5\' 10"'  (1.778 m), weight 153 lb (69.4 kg), SpO2 100 %.    HEENT: Hyperpigmentation of the buccal mucosa, no thrush or ulcers Resp: Distant breath sounds with decreased breath sounds at the right posterior base, no respiratory distress Cardio: Regular rate and rhythm GI: Nontender, no hepatosplenomegaly Vascular: No leg edema  Skin: Dryness of the palms  Portacath/PICC-without erythema  Lab Results:  Lab Results  Component Value Date   WBC 5.6 01/01/2021   HGB 12.0 (L) 01/01/2021   HCT 39.7 01/01/2021   MCV 83.6 01/01/2021   PLT 233 01/01/2021   NEUTROABS 2.7 01/01/2021    CMP  Lab Results  Component Value Date   NA 141 01/01/2021   K 3.8 01/01/2021   CL 107 01/01/2021   CO2 27 01/01/2021   GLUCOSE 92 01/01/2021   BUN 17 01/01/2021   CREATININE 1.34 (H) 01/01/2021   CALCIUM 9.2 01/01/2021   PROT 6.9 01/01/2021   ALBUMIN 4.1 01/01/2021   AST 15 01/01/2021   ALT 13 01/01/2021   ALKPHOS 72 01/01/2021   BILITOT 0.3 01/01/2021   GFRNONAA >60 01/01/2021   GFRAA >60 09/28/2019    Lab Results  Component Value Date   CEA1 1.75 09/28/2019     Medications: I have reviewed the patient's current medications.   Assessment/Plan: Moderately differentiated adenocarcinoma ascending colon, stage IIIb (pT3pN1), status post a right colectomy 11/19/2018 Lymphovascular and perineural invasion present, 2/14 lymph nodes positive, tumor  deposits present Positive radial margin, no loss of mismatch repair protein expression, discussed case with Dr. Craig Staggers mass with surrounding inflammation at multiple margins, no gross residual disease, unclear which is the "positive "radial margin, further surgery and radiation not recommended Colonoscopy 11/19/2018-completely obstructing mid ascending colon mass, could not be passed with endoscope, biopsy confirmed invasive adenocarcinoma CT abdomen/pelvis 11/18/2018-wall thickening at the mid and distal ascending colon with mild distention of the proximal ascending colon and cecum CTs 10/17/2018--no acute findings, no chest lymphadenopathy, lungs clear Cycle 1 FOLFOX 12/28/2018 Cycle 2 FOLFOX 01/11/2019, Udenyca added  Cycle 3 FOLFOX 01/25/2019, Udenyca held due to bone pain Cycle 4 FOLFOX 02/21/2019, Udenyca added Cycle 5 FOLFOX 03/09/2019, Udenyca Cycle 6 FOLFOX 03/21/2019, Udenyca Cycle 7 FOLFOX 04/04/2019, Udenyca Cycle 8 FOLFOX 04/18/2019, Udenyca  Cycle 9 FOLFOX 05/02/2019, Udenyca Cycle 10 FOLFOX 05/16/2019, oxaliplatin, 5-FU bolus, and Udenyca held Cycle 11 FOLFOX 05/30/2019, oxaliplatin, 5-FU bolus, and Udenyca held Cycle 12 FOLFOX 06/15/2019, oxaliplatin, 5-FU bolus and Udenyca held CTs 09/29/2019-new hypodense enhancing liver masses consistent with metastatic disease, indistinct marginated nodularity below the pancreas head-likely small lymph nodes Biopsy liver lesion 10/18/2019-adenocarcinoma consistent with history of colorectal carcinoma Cycle 1 FOLFIRI/Avastin 11/01/2019 Cycle 2 FOLFIRI/Avastin 11/15/2019 Cycle 3 FOLFIRI/Avastin 11/29/2019 Cycle 4 FOLFIRI/Avastin 12/13/2019 CT chest 12/27/2019-no significant change in hepatic metastases, submassive pulmonary emboli Cycle 5 FOLFIRI 01/10/2020 (Avastin discontinued) Cycle 6 FOLFIRI 02/01/2020 Cycle 7 FOLFIRI 02/13/2020 Cycle 8 FOLFIRI 02/27/2020 CTs abdomen/pelvis 03/09/2020-per Dr. Gearldine Shown review overall stable disease  Cycle 9  FOLFIRI 04/16/2020 Cycle 10 FOLFIRI 05/01/2020 Cycle 11 FOLFIRI 05/15/2020 Cycle 12 FOLFIRI 05/29/2020 CT abdomen/pelvis 06/07/2020- decreased size of liver lesions, no new lesions Cycle 13 FOLFIRI 06/12/2020 Cycle 14 FOLFIRI 07/10/2020 Cycle 15 FOLFIRI 07/24/2020 Cycle 16 FOLFIRI 08/07/2020 Cycle 17 FOLFIRI 08/21/2020 Cycle 18 FOLFIRI 09/04/2020 CT abdomen/pelvis 09/14/2020-stable faint liver lesions, no new lesions, no new site of metastatic disease Cycle 19 09/18/2020  Treatment held 10/09/2020 due to upper respiratory symptoms, known COVID exposure Cycle 20 FOLFIRI 10/30/2020 Cycle 21 FOLFIRI 11/20/2020 Cycle 22 FOLFIRI 12/12/2020 Cycle 23 FOLFIRI 01/01/2021, Emend added   Deaf Right epididymal cyst removal 03/22/2018 Asthma Port-A-Cath placement, [name], 12/23/2018 Neutropenia secondary to chemotherapy-Udenyca added for cycle 2 FOLFOX Admission with febrile neutropenia 02/08/2019 Hospital admission with submassive PE and left lower extremity DVT on 12/27/2019, heparin, transition to Xarelto 12/28/2019 CT chest 12/27/2019-submassive pulmonary emboli with evidence of right heart strain, no significant change in hepatic metastases       Disposition: Mr. Keady appears stable.  He had significant delayed nausea following the last cycle of chemotherapy.  Emend will be added to the chemotherapy regimen today.  He will return for an office visit and a restaging CT in 3 weeks.  He will receive an influenza vaccine today.  Betsy Coder, MD  01/01/2021  10:42 AM

## 2021-01-01 NOTE — Telephone Encounter (Signed)
Patient seen by Dr. Benay Spice today  Vitals are within treatment parameters.  Labs reviewed by Dr. Benay Spice and are within treatment parameters.  Per physician team, patient is ready for treatment. Please note that modifications are being made to the treatment plan including Emend added.

## 2021-01-01 NOTE — Patient Instructions (Addendum)
Alexander Reeves  The chemotherapy medication bag should finish at 46 hours, 96 hours, or 7 days. For example, if your pump is scheduled for 46 hours and it was put on at 4:00 p.m., it should finish at 2:00 p.m. the day it is scheduled to come off regardless of your appointment time.     Estimated time to finish at 1:00  Thursday, January 03, 2021.   If the display on your pump reads "Low Volume" and it is beeping, take the batteries out of the pump and come to the cancer center for it to be taken off.   If the pump alarms go off prior to the pump reading "Low Volume" then call [phone] and someone can assist you.  If the plunger comes out and the chemotherapy medication is leaking out, please use your home chemo spill kit to clean up the spill. Do NOT use paper towels or other household products.  If you have problems or questions regarding your pump, please call either [phone] (24 hours a day) or the cancer center Monday-Friday 8:00 a.m.- 4:30 p.m. at the clinic number and we will assist you. If you are unable to get assistance, then go to the nearest Emergency Department and ask the staff to contact the IV team for assistance.   Discharge Instructions: Thank you for choosing [facility] to provide your oncology and hematology care.   If you have a lab appointment with the [facility], please go directly to the [facility] and check in at the registration area.   Wear comfortable clothing and clothing appropriate for easy access to any Portacath or PICC line.   We strive to give you quality time with your provider. You may need to reschedule your appointment if you arrive late (15 or more minutes).  Arriving late affects you and other patients whose appointments are after yours.  Also, if you miss three or more appointments without notifying the office, you may be dismissed from the clinic at the providers discretion.      For  prescription refill requests, have your pharmacy contact our office and allow 72 hours for refills to be completed.    Today you received the following chemotherapy and/or immunotherapy agents irinotecan, leucovorin, fluorouracil      To help prevent nausea and vomiting after your treatment, we encourage you to take your nausea medication as directed.  BELOW ARE SYMPTOMS THAT SHOULD BE REPORTED IMMEDIATELY: *FEVER GREATER THAN 100.4 F (38 C) OR HIGHER *CHILLS OR SWEATING *NAUSEA AND VOMITING THAT IS NOT CONTROLLED WITH YOUR NAUSEA MEDICATION *UNUSUAL SHORTNESS OF BREATH *UNUSUAL BRUISING OR BLEEDING *URINARY PROBLEMS (pain or burning when urinating, or frequent urination) *BOWEL PROBLEMS (unusual diarrhea, constipation, pain near the anus) TENDERNESS IN MOUTH AND THROAT WITH OR WITHOUT PRESENCE OF ULCERS (sore throat, sores in mouth, or a toothache) UNUSUAL RASH, SWELLING OR PAIN  UNUSUAL VAGINAL DISCHARGE OR ITCHING   Items with * indicate a potential emergency and should be followed up as soon as possible or go to the Emergency Department if any problems should occur.  Please show the CHEMOTHERAPY ALERT CARD or IMMUNOTHERAPY ALERT CARD at check-in to the Emergency Department and triage nurse.  Should you have questions after your visit or need to cancel or reschedule your appointment, please contact [facility]  Dept: [phone]  and follow the prompts.  Office hours are 8:00 a.m. to 4:30 p.m. Monday - Friday. Please note that  voicemails left after 4:00 p.m. may not be returned until the following business day.  We are closed weekends and major holidays. You have access to a nurse at all times for urgent questions. Please call the main number to the clinic Dept: [phone] and follow the prompts.   For any non-urgent questions, you may also contact your provider using MyChart. We now offer e-Visits for anyone 60 and older to request care online for  non-urgent symptoms. For details visit [email or web address].   Also download the MyChart app! Go to the app store, search "MyChart", open the app, select [facility], and log in with your MyChart username and password.  Due to Covid, a mask is required upon entering the hospital/clinic. If you do not have a mask, one will be given to you upon arrival. For doctor visits, patients may have 1 support person aged 64 or older with them. For treatment visits, patients cannot have anyone with them due to current Covid guidelines and our immunocompromised population.   Irinotecan injection What is this medication? IRINOTECAN (ir in oh TEE kan ) is a chemotherapy drug. It is used to treat colon and rectal cancer. This medicine may be used for other purposes; ask your health care provider or pharmacist if you have questions. COMMON BRAND NAME(S): Camptosar What should I tell my care team before I take this medication? They need to know if you have any of these conditions: dehydration diarrhea infection (especially a virus infection such as chickenpox, cold sores, or herpes) liver disease low blood counts, like low white cell, platelet, or red cell counts low levels of calcium, magnesium, or potassium in the blood recent or ongoing radiation therapy an unusual or allergic reaction to irinotecan, other medicines, foods, dyes, or preservatives pregnant or trying to get pregnant breast-feeding How should I use this medication? This drug is given as an infusion into a vein. It is administered in a hospital or clinic by a specially trained health care professional. Talk to your pediatrician regarding the use of this medicine in children. Special care may be needed. Overdosage: If you think you have taken too much of this medicine contact a poison control center or emergency room at once. NOTE: This medicine is only for you. Do not share this medicine with others. What if I miss a dose? It is important  not to miss your dose. Call your doctor or health care professional if you are unable to keep an appointment. What may interact with this medication? Do not take this medicine with any of the following medications: cobicistat itraconazole This medicine may interact with the following medications: antiviral medicines for HIV or AIDS certain antibiotics like rifampin or rifabutin certain medicines for fungal infections like ketoconazole, posaconazole, and voriconazole certain medicines for seizures like carbamazepine, phenobarbital, phenotoin clarithromycin gemfibrozil nefazodone [facility]. John's Wort This list may not describe all possible interactions. Give your health care provider a list of all the medicines, herbs, non-prescription drugs, or dietary supplements you use. Also tell them if you smoke, drink alcohol, or use illegal drugs. Some items may interact with your medicine. What should I watch for while using this medication? Your condition will be monitored carefully while you are receiving this medicine. You will need important blood work done while you are taking this medicine. This drug may make you feel generally unwell. This is not uncommon, as chemotherapy can affect healthy cells as well as cancer cells. Report any side effects. Continue your course of treatment even though  you feel ill unless your doctor tells you to stop. In some cases, you may be given additional medicines to help with side effects. Follow all directions for their use. You may get drowsy or dizzy. Do not drive, use machinery, or do anything that needs mental alertness until you know how this medicine affects you. Do not stand or sit up quickly, especially if you are an older patient. This reduces the risk of dizzy or fainting spells. Call your health care professional for advice if you get a fever, chills, or sore throat, or other symptoms of a cold or flu. Do not treat yourself. This medicine decreases your body's  ability to fight infections. Try to avoid being around people who are sick. Avoid taking products that contain aspirin, acetaminophen, ibuprofen, naproxen, or ketoprofen unless instructed by your doctor. These medicines may hide a fever. This medicine may increase your risk to bruise or bleed. Call your doctor or health care professional if you notice any unusual bleeding. Be careful brushing and flossing your teeth or using a toothpick because you may get an infection or bleed more easily. If you have any dental work done, tell your dentist you are receiving this medicine. Do not become pregnant while taking this medicine or for 6 months after stopping it. Women should inform their health care professional if they wish to become pregnant or think they might be pregnant. Men should not father a child while taking this medicine and for 3 months after stopping it. There is potential for serious side effects to an unborn child. Talk to your health care professional for more information. Do not breast-feed an infant while taking this medicine or for 7 days after stopping it. This medicine has caused ovarian failure in some women. This medicine may make it more difficult to get pregnant. Talk to your health care professional if you are concerned about your fertility. This medicine has caused decreased sperm counts in some men. This may make it more difficult to father a child. Talk to your health care professional if you are concerned about your fertility. What side effects may I notice from receiving this medication? Side effects that you should report to your doctor or health care professional as soon as possible: allergic reactions like skin rash, itching or hives, swelling of the face, lips, or tongue chest pain diarrhea flushing, runny nose, sweating during infusion low blood counts - this medicine may decrease the number of white blood cells, red blood cells and platelets. You may be at increased risk  for infections and bleeding. nausea, vomiting pain, swelling, warmth in the leg signs of decreased platelets or bleeding - bruising, pinpoint red spots on the skin, black, tarry stools, blood in the urine signs of infection - fever or chills, cough, sore throat, pain or difficulty passing urine signs of decreased red blood cells - unusually weak or tired, fainting spells, lightheadedness Side effects that usually do not require medical attention (report to your doctor or health care professional if they continue or are bothersome): constipation hair loss headache loss of appetite mouth sores stomach pain This list may not describe all possible side effects. Call your doctor for medical advice about side effects. You may report side effects to FDA at 1-800-FDA-1088. Where should I keep my medication? This drug is given in a hospital or clinic and will not be stored at home. NOTE: This sheet is a summary. It may not cover all possible information. If you have questions about this  medicine, talk to your doctor, pharmacist, or health care provider.  2022 [employer] Standard (2020-09-18 00:00:00)  Leucovorin injection What is this medication? LEUCOVORIN (loo koe VOR in) is used to prevent or treat the harmful effects of some medicines. This medicine is used to treat anemia caused by a low amount of folic acid in the body. It is also used with 5-fluorouracil (5-FU) to treat colon cancer. This medicine may be used for other purposes; ask your health care provider or pharmacist if you have questions. What should I tell my care team before I take this medication? They need to know if you have any of these conditions: anemia from low levels of vitamin B-12 in the blood an unusual or allergic reaction to leucovorin, folic acid, other medicines, foods, dyes, or preservatives pregnant or trying to get pregnant breast-feeding How should I use this medication? This medicine is for injection into a  muscle or into a vein. It is given by a health care professional in a hospital or clinic setting. Talk to your pediatrician regarding the use of this medicine in children. Special care may be needed. Overdosage: If you think you have taken too much of this medicine contact a poison control center or emergency room at once. NOTE: This medicine is only for you. Do not share this medicine with others. What if I miss a dose? This does not apply. What may interact with this medication? capecitabine fluorouracil phenobarbital phenytoin primidone trimethoprim-sulfamethoxazole This list may not describe all possible interactions. Give your health care provider a list of all the medicines, herbs, non-prescription drugs, or dietary supplements you use. Also tell them if you smoke, drink alcohol, or use illegal drugs. Some items may interact with your medicine. What should I watch for while using this medication? Your condition will be monitored carefully while you are receiving this medicine. This medicine may increase the side effects of 5-fluorouracil, 5-FU. Tell your doctor or health care professional if you have diarrhea or mouth sores that do not get better or that get worse. What side effects may I notice from receiving this medication? Side effects that you should report to your doctor or health care professional as soon as possible: allergic reactions like skin rash, itching or hives, swelling of the face, lips, or tongue breathing problems fever, infection mouth sores unusual bleeding or bruising unusually weak or tired Side effects that usually do not require medical attention (report to your doctor or health care professional if they continue or are bothersome): constipation or diarrhea loss of appetite nausea, vomiting This list may not describe all possible side effects. Call your doctor for medical advice about side effects. You may report side effects to FDA at  1-800-FDA-1088. Where should I keep my medication? This drug is given in a hospital or clinic and will not be stored at home. NOTE: This sheet is a summary. It may not cover all possible information. If you have questions about this medicine, talk to your doctor, pharmacist, or health care provider.  2022 Else[employer]/Gold Standard (2007-07-08 00:00:00)  Fluorouracil, 5-FU injection What is this medication? FLUOROURACIL, 5-FU (flure oh YOOR a sil) is a chemotherapy drug. It slows the growth of cancer cells. This medicine is used to treat many types of cancer like breast cancer, colon or rectal cancer, pancreatic cancer, and stomach cancer. This medicine may be used for other purposes; ask your health care provider or pharmacist if you have questions. COMMON BRAND NAME(S): Adrucil What should I tell my care  team before I take this medication? They need to know if you have any of these conditions: blood disorders dihydropyrimidine dehydrogenase (DPD) deficiency infection (especially a virus infection such as chickenpox, cold sores, or herpes) kidney disease liver disease malnourished, poor nutrition recent or ongoing radiation therapy an unusual or allergic reaction to fluorouracil, other chemotherapy, other medicines, foods, dyes, or preservatives pregnant or trying to get pregnant breast-feeding How should I use this medication? This drug is given as an infusion or injection into a vein. It is administered in a hospital or clinic by a specially trained health care professional. Talk to your pediatrician regarding the use of this medicine in children. Special care may be needed. Overdosage: If you think you have taken too much of this medicine contact a poison control center or emergency room at once. NOTE: This medicine is only for you. Do not share this medicine with others. What if I miss a dose? It is important not to miss your dose. Call your doctor or health care professional if you  are unable to keep an appointment. What may interact with this medication? Do not take this medicine with any of the following medications: live virus vaccines This medicine may also interact with the following medications: medicines that treat or prevent blood clots like warfarin, enoxaparin, and dalteparin This list may not describe all possible interactions. Give your health care provider a list of all the medicines, herbs, non-prescription drugs, or dietary supplements you use. Also tell them if you smoke, drink alcohol, or use illegal drugs. Some items may interact with your medicine. What should I watch for while using this medication? Visit your doctor for checks on your progress. This drug may make you feel generally unwell. This is not uncommon, as chemotherapy can affect healthy cells as well as cancer cells. Report any side effects. Continue your course of treatment even though you feel ill unless your doctor tells you to stop. In some cases, you may be given additional medicines to help with side effects. Follow all directions for their use. Call your doctor or health care professional for advice if you get a fever, chills or sore throat, or other symptoms of a cold or flu. Do not treat yourself. This drug decreases your body's ability to fight infections. Try to avoid being around people who are sick. This medicine may increase your risk to bruise or bleed. Call your doctor or health care professional if you notice any unusual bleeding. Be careful brushing and flossing your teeth or using a toothpick because you may get an infection or bleed more easily. If you have any dental work done, tell your dentist you are receiving this medicine. Avoid taking products that contain aspirin, acetaminophen, ibuprofen, naproxen, or ketoprofen unless instructed by your doctor. These medicines may hide a fever. Do not become pregnant while taking this medicine. Women should inform their doctor if they  wish to become pregnant or think they might be pregnant. There is a potential for serious side effects to an unborn child. Talk to your health care professional or pharmacist for more information. Do not breast-feed an infant while taking this medicine. Men should inform their doctor if they wish to father a child. This medicine may lower sperm counts. Do not treat diarrhea with over the counter products. Contact your doctor if you have diarrhea that lasts more than 2 days or if it is severe and watery. This medicine can make you more sensitive to the sun. Keep out of  the sun. If you cannot avoid being in the sun, wear protective clothing and use sunscreen. Do not use sun lamps or tanning beds/booths. What side effects may I notice from receiving this medication? Side effects that you should report to your doctor or health care professional as soon as possible: allergic reactions like skin rash, itching or hives, swelling of the face, lips, or tongue low blood counts - this medicine may decrease the number of white blood cells, red blood cells and platelets. You may be at increased risk for infections and bleeding. signs of infection - fever or chills, cough, sore throat, pain or difficulty passing urine signs of decreased platelets or bleeding - bruising, pinpoint red spots on the skin, black, tarry stools, blood in the urine signs of decreased red blood cells - unusually weak or tired, fainting spells, lightheadedness breathing problems changes in vision chest pain mouth sores nausea and vomiting pain, swelling, redness at site where injected pain, tingling, numbness in the hands or feet redness, swelling, or sores on hands or feet stomach pain unusual bleeding Side effects that usually do not require medical attention (report to your doctor or health care professional if they continue or are bothersome): changes in finger or toe nails diarrhea dry or itchy skin hair loss headache loss  of appetite sensitivity of eyes to the light stomach upset unusually teary eyes This list may not describe all possible side effects. Call your doctor for medical advice about side effects. You may report side effects to FDA at 1-800-FDA-1088. Where should I keep my medication? This drug is given in a hospital or clinic and will not be stored at home. NOTE: This sheet is a summary. It may not cover all possible information. If you have questions about this medicine, talk to your doctor, pharmacist, or health care provider.  2022 Elsevier/Gold Standard (2020-09-18 00:00:00)  Influenza Virus Vaccine injection What is this medication? INFLUENZA VIRUS VACCINE (in floo EN zuh VAHY ruhs vak SEEN) helps to reduce the risk of getting influenza also known as the flu. The vaccine only helps protect you against some strains of the flu. This medicine may be used for other purposes; ask your health care provider or pharmacist if you have questions. COMMON BRAND NAME(S): Afluria, Afluria Quadrivalent, Agriflu, Alfuria, FLUAD, FLUAD Quadrivalent, Fluarix, Fluarix Quadrivalent, Flublok, Flublok Quadrivalent, FLUCELVAX, FLUCELVAX Quadrivalent, Flulaval, Flulaval Quadrivalent, Fluvirin, Fluzone, Fluzone High-Dose, Fluzone Intradermal, Fluzone Quadrivalent What should I tell my care team before I take this medication? They need to know if you have any of these conditions: bleeding disorder like hemophilia fever or infection Guillain-Barre syndrome or other neurological problems immune system problems infection with the human immunodeficiency virus (HIV) or AIDS low blood platelet counts multiple sclerosis an unusual or allergic reaction to influenza virus vaccine, latex, other medicines, foods, dyes, or preservatives. Different brands of vaccines contain different allergens. Some may contain latex or eggs. Talk to your doctor about your allergies to make sure that you get the right vaccine. pregnant or trying  to get pregnant breast-feeding How should I use this medication? This vaccine is for injection into a muscle or under the skin. It is given by a health care professional. A copy of Vaccine Information Statements will be given before each vaccination. Read this sheet carefully each time. The sheet may change frequently. Talk to your healthcare provider to see which vaccines are right for you. Some vaccines should not be used in all age groups. Overdosage: If you think you have  taken too much of this medicine contact a poison control center or emergency room at once. NOTE: This medicine is only for you. Do not share this medicine with others. What if I miss a dose? This does not apply. What may interact with this medication? chemotherapy or radiation therapy medicines that lower your immune system like etanercept, anakinra, infliximab, and adalimumab medicines that treat or prevent blood clots like warfarin phenytoin steroid medicines like prednisone or cortisone theophylline vaccines This list may not describe all possible interactions. Give your health care provider a list of all the medicines, herbs, non-prescription drugs, or dietary supplements you use. Also tell them if you smoke, drink alcohol, or use illegal drugs. Some items may interact with your medicine. What should I watch for while using this medication? Report any side effects that do not go away within 3 days to your doctor or health care professional. Call your health care provider if any unusual symptoms occur within 6 weeks of receiving this vaccine. You may still catch the flu, but the illness is not usually as bad. You cannot get the flu from the vaccine. The vaccine will not protect against colds or other illnesses that may cause fever. The vaccine is needed every year. What side effects may I notice from receiving this medication? Side effects that you should report to your doctor or health care professional as soon as  possible: allergic reactions like skin rash, itching or hives, swelling of the face, lips, or tongue Side effects that usually do not require medical attention (report to your doctor or health care professional if they continue or are bothersome): fever headache muscle aches and pains pain, tenderness, redness, or swelling at the injection site tiredness This list may not describe all possible side effects. Call your doctor for medical advice about side effects. You may report side effects to FDA at 1-800-FDA-1088. Where should I keep my medication? The vaccine will be given by a health care professional in a clinic, pharmacy, doctor's office, or other health care setting. You will not be given vaccine doses to store at home. NOTE: This sheet is a summary. It may not cover all possible information. If you have questions about this medicine, talk to your doctor, pharmacist, or health care provider.  2022 Elsevier/Gold Standard (2020-09-18 00:00:00)

## 2021-01-01 NOTE — Progress Notes (Signed)
Patient presents for treatment. RN assessment completed along with the following:  Labs/vitals reviewed - Yes, and within treatment parameters.   Weight within 10% of previous measurement - Yes Oncology Treatment Attestation completed for current therapy- Yes, on date 10/21/19 Informed consent completed and reflects current therapy/intent - Yes, on date 11/01/19             Provider progress note reviewed - Yes, today's provider note was reviewed. Treatment/Antibody/Supportive plan reviewed - Yes, and there are no adjustments needed for today's treatment. S&H and other orders reviewed - Yes, and emend added to premeds Previous treatment date reviewed - Yes, and the appropriate amount of time has elapsed between treatments. Clinic Hand Off Received from - Danae Orleans, LPN  Patient to proceed with treatment.

## 2021-01-03 ENCOUNTER — Inpatient Hospital Stay: Payer: Medicare Other

## 2021-01-03 ENCOUNTER — Other Ambulatory Visit: Payer: Self-pay

## 2021-01-03 VITALS — BP 96/64 | HR 91 | Temp 98.2°F | Resp 18

## 2021-01-03 DIAGNOSIS — Z5111 Encounter for antineoplastic chemotherapy: Secondary | ICD-10-CM | POA: Diagnosis not present

## 2021-01-03 DIAGNOSIS — C189 Malignant neoplasm of colon, unspecified: Secondary | ICD-10-CM

## 2021-01-03 DIAGNOSIS — T451X5A Adverse effect of antineoplastic and immunosuppressive drugs, initial encounter: Secondary | ICD-10-CM | POA: Diagnosis not present

## 2021-01-03 DIAGNOSIS — C182 Malignant neoplasm of ascending colon: Secondary | ICD-10-CM | POA: Diagnosis not present

## 2021-01-03 DIAGNOSIS — D701 Agranulocytosis secondary to cancer chemotherapy: Secondary | ICD-10-CM | POA: Diagnosis not present

## 2021-01-03 DIAGNOSIS — C787 Secondary malignant neoplasm of liver and intrahepatic bile duct: Secondary | ICD-10-CM | POA: Diagnosis not present

## 2021-01-03 MED ORDER — SODIUM CHLORIDE 0.9% FLUSH
10.0000 mL | INTRAVENOUS | Status: DC | PRN
  Administered 2021-01-03: 13:00:00 10 mL

## 2021-01-03 MED ORDER — PEGFILGRASTIM-CBQV 6 MG/0.6ML ~~LOC~~ SOSY
6.0000 mg | PREFILLED_SYRINGE | Freq: Once | SUBCUTANEOUS | Status: AC
  Administered 2021-01-03: 13:00:00 6 mg via SUBCUTANEOUS

## 2021-01-03 MED ORDER — HEPARIN SOD (PORK) LOCK FLUSH 100 UNIT/ML IV SOLN
500.0000 [IU] | Freq: Once | INTRAVENOUS | Status: AC | PRN
  Administered 2021-01-03: 13:00:00 500 [IU]

## 2021-01-03 NOTE — Patient Instructions (Signed)
[facility]  Discharge Instructions: Thank you for choosing [facility] to provide your oncology and hematology care.   If you have a lab appointment with the Cancer Center, please go directly to the Cancer Center and check in at the registration area.   Wear comfortable clothing and clothing appropriate for easy access to any Portacath or PICC line.   We strive to give you quality time with your provider. You may need to reschedule your appointment if you arrive late (15 or more minutes).  Arriving late affects you and other patients whose appointments are after yours.  Also, if you miss three or more appointments without notifying the office, you may be dismissed from the clinic at the provider's discretion.      For prescription refill requests, have your pharmacy contact our office and allow 72 hours for refills to be completed.    Today you received the following Udenyca    To help prevent nausea and vomiting after your treatment, we encourage you to take your nausea medication as directed.  BELOW ARE SYMPTOMS THAT SHOULD BE REPORTED IMMEDIATELY: *FEVER GREATER THAN 100.4 F (38 C) OR HIGHER *CHILLS OR SWEATING *NAUSEA AND VOMITING THAT IS NOT CONTROLLED WITH YOUR NAUSEA MEDICATION *UNUSUAL SHORTNESS OF BREATH *UNUSUAL BRUISING OR BLEEDING *URINARY PROBLEMS (pain or burning when urinating, or frequent urination) *BOWEL PROBLEMS (unusual diarrhea, constipation, pain near the anus) TENDERNESS IN MOUTH AND THROAT WITH OR WITHOUT PRESENCE OF ULCERS (sore throat, sores in mouth, or a toothache) UNUSUAL RASH, SWELLING OR PAIN  UNUSUAL VAGINAL DISCHARGE OR ITCHING   Items with * indicate a potential emergency and should be followed up as soon as possible or go to the Emergency Department if any problems should occur.  Please show the CHEMOTHERAPY ALERT CARD or IMMUNOTHERAPY ALERT CARD at check-in to the Emergency Department and triage  nurse.  Should you have questions after your visit or need to cancel or reschedule your appointment, please contact [facility]  Dept: [phone]  and follow the prompts.  Office hours are 8:00 a.m. to 4:30 p.m. Monday - Friday. Please note that voicemails left after 4:00 p.m. may not be returned until the following business day.  We are closed weekends and major holidays. You have access to a nurse at all times for urgent questions. Please call the main number to the clinic Dept: [phone] and follow the prompts.   For any non-urgent questions, you may also contact your provider using MyChart. We now offer e-Visits for anyone 18 and older to request care online for non-urgent symptoms. For details visit [email or web address].   Also download the MyChart app! Go to the app store, search "MyChart", open the app, select [facility], and log in with your MyChart username and password.  Due to Covid, a mask is required upon entering the hospital/clinic. If you do not have a mask, one will be given to you upon arrival. For doctor visits, patients may have 1 support person aged 18 or older with them. For treatment visits, patients cannot have anyone with them due to current Covid guidelines and our immunocompromised population.   Pegfilgrastim Injection What is this medication? PEGFILGRASTIM (PEG fil gra stim) lowers the risk of infection in people who are receiving chemotherapy. It works by helping your body make more white blood cells, which protects your body from infection. It may also be used to help people who have been exposed to high doses of   radiation. This medicine may be used for other purposes; ask your health care provider or pharmacist if you have questions. COMMON BRAND NAME(S): Fulphila, Neulasta, Nyvepria, UDENYCA, Ziextenzo What should I tell my care team before I take this medication? They need to know if you have any of these conditions: Kidney  disease Latex allergy Ongoing radiation therapy Sickle cell disease Skin reactions to acrylic adhesives (On-Body Injector only) An unusual or allergic reaction to pegfilgrastim, filgrastim, other medications, foods, dyes, or preservatives Pregnant or trying to get pregnant Breast-feeding How should I use this medication? This medication is for injection under the skin. If you get this medication at home, you will be taught how to prepare and give the pre-filled syringe or how to use the On-body Injector. Refer to the patient Instructions for Use for detailed instructions. Use exactly as directed. Tell your care team immediately if you suspect that the On-body Injector may not have performed as intended or if you suspect the use of the On-body Injector resulted in a missed or partial dose. It is important that you put your used needles and syringes in a special sharps container. Do not put them in a trash can. If you do not have a sharps container, call your pharmacist or care team to get one. Talk to your care team about the use of this medication in children. While this medication may be prescribed for selected conditions, precautions do apply. Overdosage: If you think you have taken too much of this medicine contact a poison control center or emergency room at once. NOTE: This medicine is only for you. Do not share this medicine with others. What if I miss a dose? It is important not to miss your dose. Call your care team if you miss your dose. If you miss a dose due to an On-body Injector failure or leakage, a new dose should be administered as soon as possible using a single prefilled syringe for manual use. What may interact with this medication? Interactions have not been studied. This list may not describe all possible interactions. Give your health care provider a list of all the medicines, herbs, non-prescription drugs, or dietary supplements you use. Also tell them if you smoke, drink  alcohol, or use illegal drugs. Some items may interact with your medicine. What should I watch for while using this medication? Your condition will be monitored carefully while you are receiving this medication. You may need blood work done while you are taking this medication. Talk to your care team about your risk of cancer. You may be more at risk for certain types of cancer if you take this medication. If you are going to need a MRI, CT scan, or other procedure, tell your care team that you are using this medication (On-Body Injector only). What side effects may I notice from receiving this medication? Side effects that you should report to your care team as soon as possible: Allergic reactions--skin rash, itching, hives, swelling of the face, lips, tongue, or throat Capillary leak syndrome--stomach or muscle pain, unusual weakness or fatigue, feeling faint or lightheaded, decrease in the amount of urine, swelling of the ankles, hands, or feet, trouble breathing High white blood cell level--fever, fatigue, trouble breathing, night sweats, change in vision, weight loss Inflammation of the aorta--fever, fatigue, back, chest, or stomach pain, severe headache Kidney injury (glomerulonephritis)--decrease in the amount of urine, red or dark brown urine, foamy or bubbly urine, swelling of the ankles, hands, or feet Shortness of breath or   trouble breathing Spleen injury--pain in upper left stomach or shoulder Unusual bruising or bleeding Side effects that usually do not require medical attention (report to your care team if they continue or are bothersome): Bone pain Pain in the hands or feet This list may not describe all possible side effects. Call your doctor for medical advice about side effects. You may report side effects to FDA at 1-800-FDA-1088. Where should I keep my medication? Keep out of the reach of children. If you are using this medication at home, you will be instructed on how to  store it. Throw away any unused medication after the expiration date on the label. NOTE: This sheet is a summary. It may not cover all possible information. If you have questions about this medicine, talk to your doctor, pharmacist, or health care provider.  2022 Elsevier/Gold Standard (2020-09-18 00:00:00)  

## 2021-01-15 ENCOUNTER — Other Ambulatory Visit (INDEPENDENT_AMBULATORY_CARE_PROVIDER_SITE_OTHER): Payer: Self-pay | Admitting: Primary Care

## 2021-01-15 NOTE — Telephone Encounter (Signed)
Sent to PCP ?

## 2021-01-20 ENCOUNTER — Other Ambulatory Visit: Payer: Self-pay | Admitting: Oncology

## 2021-01-22 ENCOUNTER — Other Ambulatory Visit: Payer: Self-pay

## 2021-01-22 ENCOUNTER — Inpatient Hospital Stay: Payer: Medicare Other | Attending: Nurse Practitioner | Admitting: Nurse Practitioner

## 2021-01-22 ENCOUNTER — Inpatient Hospital Stay: Payer: Medicare Other

## 2021-01-22 ENCOUNTER — Encounter: Payer: Self-pay | Admitting: Nurse Practitioner

## 2021-01-22 ENCOUNTER — Ambulatory Visit (HOSPITAL_BASED_OUTPATIENT_CLINIC_OR_DEPARTMENT_OTHER)
Admission: RE | Admit: 2021-01-22 | Discharge: 2021-01-22 | Disposition: A | Discharge status: Home / Self Care | Payer: Medicare Other | Source: Ambulatory Visit | Attending: Oncology | Admitting: Oncology

## 2021-01-22 ENCOUNTER — Other Ambulatory Visit: Payer: Medicare Other

## 2021-01-22 VITALS — BP 127/81 | HR 88 | Temp 98.2°F | Resp 18 | Ht 70.0 in | Wt 152.4 lb

## 2021-01-22 DIAGNOSIS — C182 Malignant neoplasm of ascending colon: Secondary | ICD-10-CM

## 2021-01-22 DIAGNOSIS — C787 Secondary malignant neoplasm of liver and intrahepatic bile duct: Secondary | ICD-10-CM | POA: Diagnosis not present

## 2021-01-22 DIAGNOSIS — K5730 Diverticulosis of large intestine without perforation or abscess without bleeding: Secondary | ICD-10-CM | POA: Diagnosis not present

## 2021-01-22 DIAGNOSIS — I700 Atherosclerosis of aorta: Secondary | ICD-10-CM | POA: Diagnosis not present

## 2021-01-22 DIAGNOSIS — Z95828 Presence of other vascular implants and grafts: Secondary | ICD-10-CM

## 2021-01-22 DIAGNOSIS — Z86711 Personal history of pulmonary embolism: Secondary | ICD-10-CM | POA: Insufficient documentation

## 2021-01-22 LAB — CBC WITH DIFFERENTIAL (CANCER CENTER ONLY)
Abs Immature Granulocytes: 0.07 10*3/uL (ref 0.00–0.07)
Basophils Absolute: 0 10*3/uL (ref 0.0–0.1)
Basophils Relative: 1 %
Eosinophils Absolute: 0.2 10*3/uL (ref 0.0–0.5)
Eosinophils Relative: 4 %
HCT: 38.9 % — ABNORMAL LOW (ref 39.0–52.0)
Hemoglobin: 11.8 g/dL — ABNORMAL LOW (ref 13.0–17.0)
Immature Granulocytes: 1 %
Lymphocytes Relative: 35 %
Lymphs Abs: 1.8 10*3/uL (ref 0.7–4.0)
MCH: 25.2 pg — ABNORMAL LOW (ref 26.0–34.0)
MCHC: 30.3 g/dL (ref 30.0–36.0)
MCV: 83.1 fL (ref 80.0–100.0)
Monocytes Absolute: 0.5 10*3/uL (ref 0.1–1.0)
Monocytes Relative: 10 %
Neutro Abs: 2.5 10*3/uL (ref 1.7–7.7)
Neutrophils Relative %: 49 %
Platelet Count: 193 10*3/uL (ref 150–400)
RBC: 4.68 MIL/uL (ref 4.22–5.81)
RDW: 19.2 % — ABNORMAL HIGH (ref 11.5–15.5)
WBC Count: 5.1 10*3/uL (ref 4.0–10.5)
nRBC: 0 % (ref 0.0–0.2)

## 2021-01-22 LAB — CMP (CANCER CENTER ONLY)
ALT: 15 U/L (ref 0–44)
AST: 15 U/L (ref 15–41)
Albumin: 4.1 g/dL (ref 3.5–5.0)
Alkaline Phosphatase: 59 U/L (ref 38–126)
Anion gap: 7 (ref 5–15)
BUN: 11 mg/dL (ref 6–20)
CO2: 25 mmol/L (ref 22–32)
Calcium: 8.8 mg/dL — ABNORMAL LOW (ref 8.9–10.3)
Chloride: 106 mmol/L (ref 98–111)
Creatinine: 1.22 mg/dL (ref 0.61–1.24)
GFR, Estimated: >60 mL/min (ref >60)
Glucose, Bld: 90 mg/dL (ref 70–99)
Potassium: 4.1 mmol/L (ref 3.5–5.1)
Sodium: 138 mmol/L (ref 135–145)
Total Bilirubin: 0.3 mg/dL (ref 0.3–1.2)
Total Protein: 6.9 g/dL (ref 6.5–8.1)

## 2021-01-22 IMAGING — CT CT ABD-PELV W/ CM
2 of 5 series · 16 of 46 positions shown, 18 images · IV contrast (APPLIED)
Comparison: [DATE]

CLINICAL DATA: Ascending colon cancer restaging. Right
hemicolectomy [DATE]. Metastatic disease to the liver. Recently
finished last cycle of chemotherapy.

EXAM:
CT ABDOMEN AND PELVIS WITH CONTRAST
TECHNIQUE: Multidetector CT imaging of the abdomen and pelvis was performed
using the standard protocol following bolus administration of
intravenous contrast.
CONTRAST:  75mL OMNIPAQUE IOHEXOL 300 MG/ML  SOLN

[Series 2: abd pel w · axial · 0.75mm/px · z∈[-309,+81]mm · 13 of 88 slices shown, 15 images]
[im 5/88  soft-tissue]
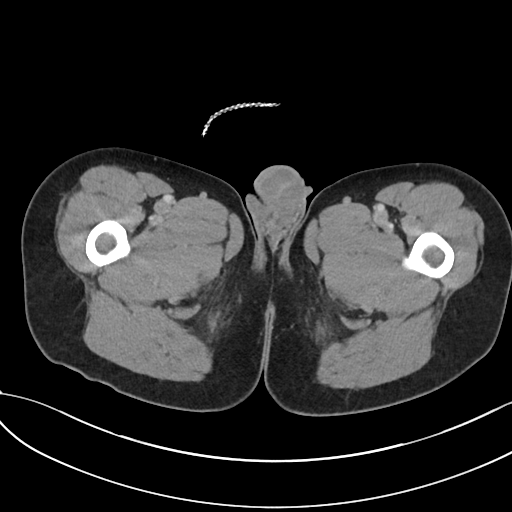
[im 5/88  bone]
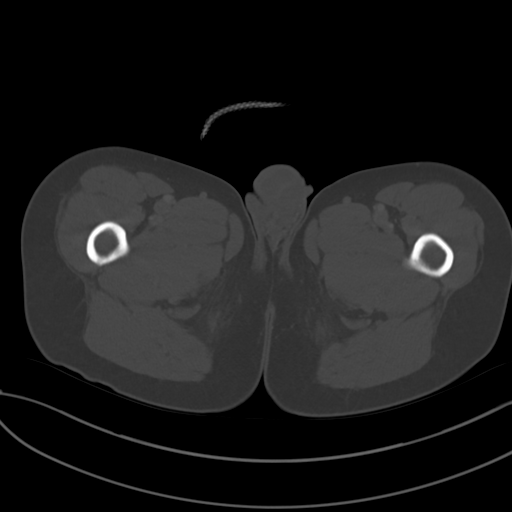
[im 14/88  soft-tissue]
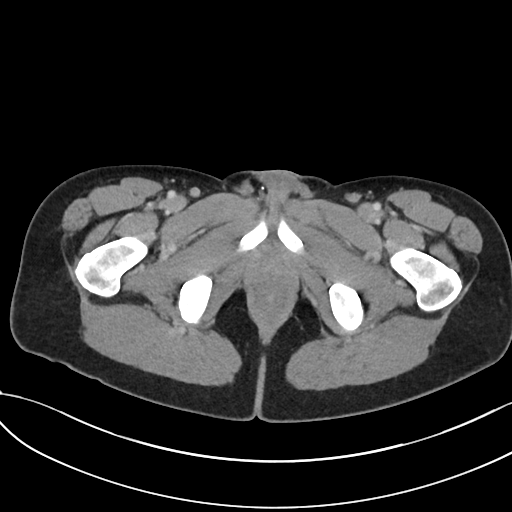
[im 18/88  soft-tissue]
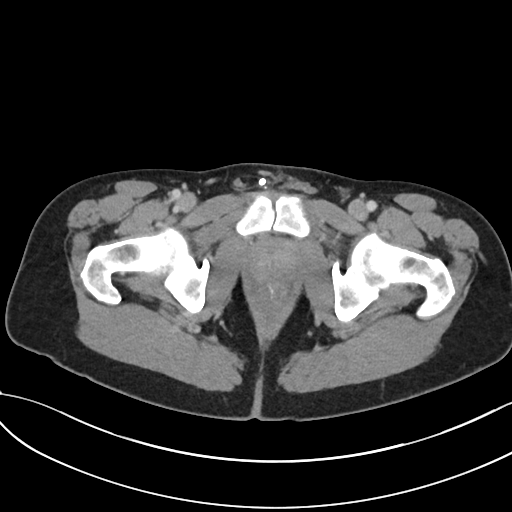
[im 27/88  soft-tissue]
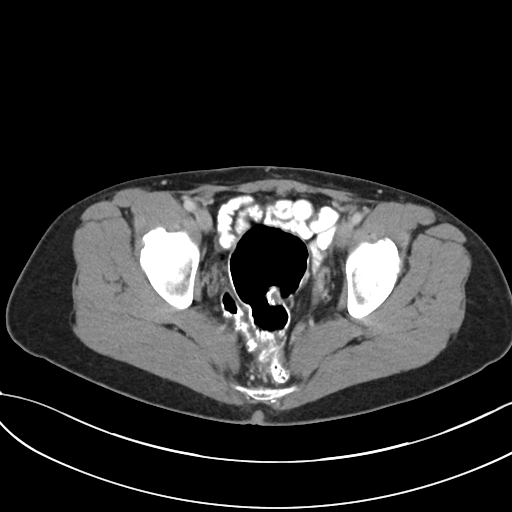
[im 31/88  soft-tissue]
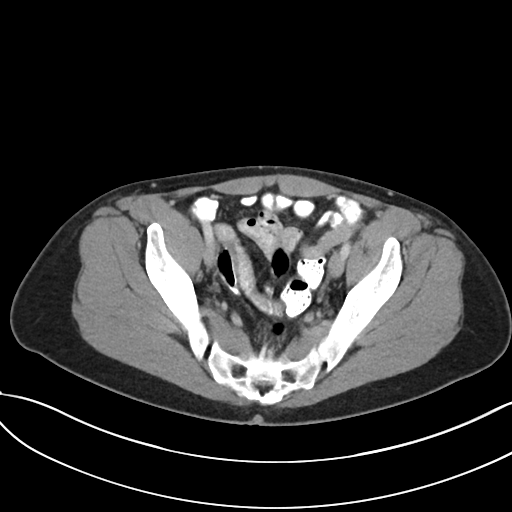
[im 40/88  soft-tissue]
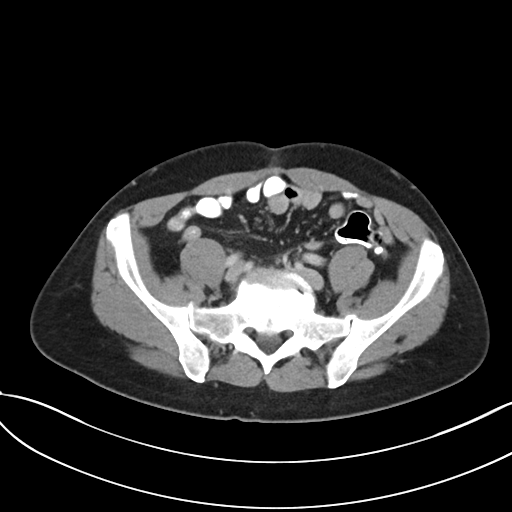
[im 44/88  soft-tissue]
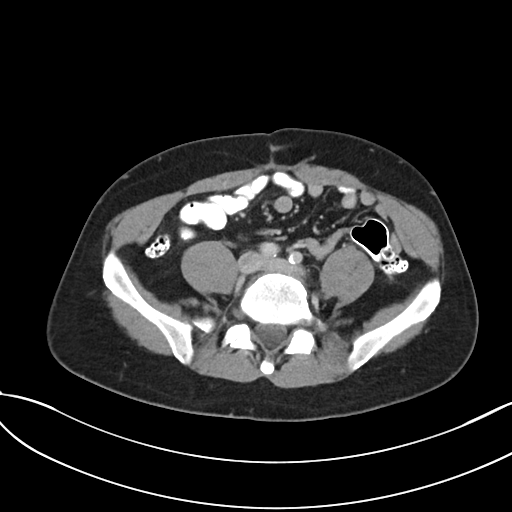
[im 48/88  soft-tissue]
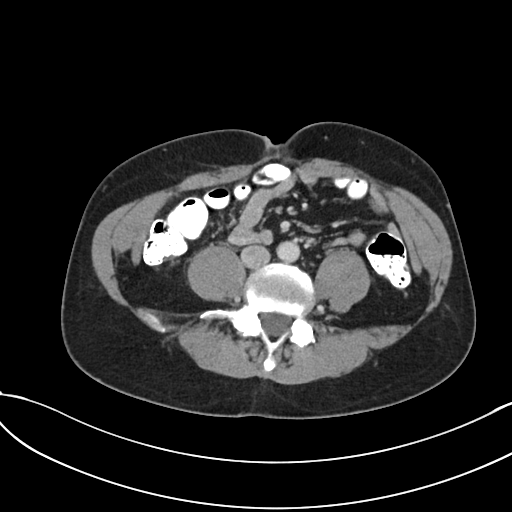
[im 57/88  soft-tissue]
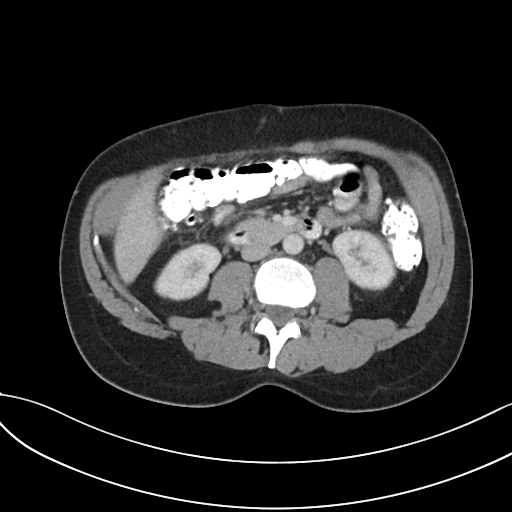
[im 57/88  bone]
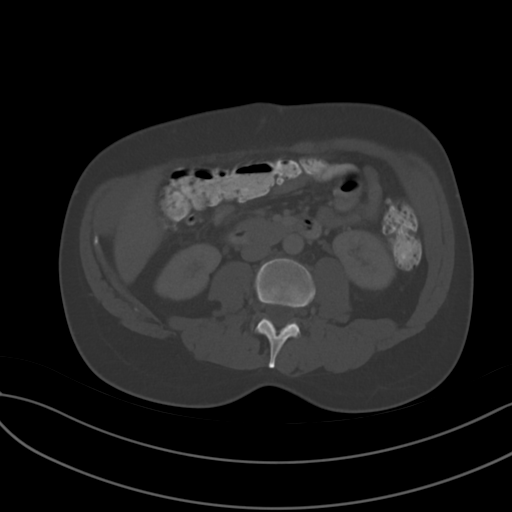
[im 61/88  soft-tissue]
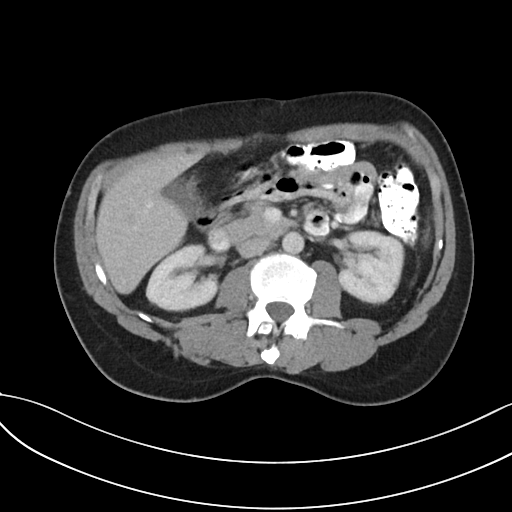
[im 70/88  soft-tissue]
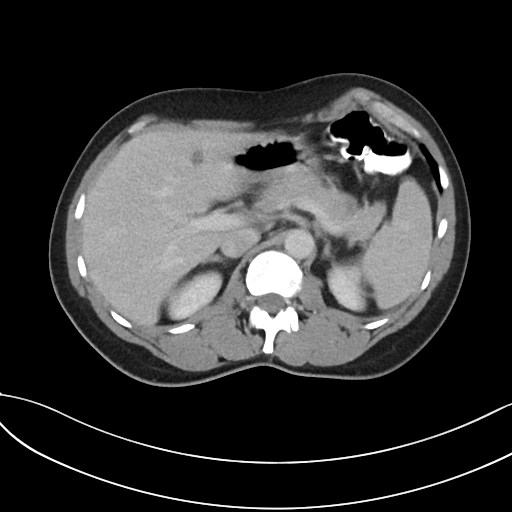
[im 74/88  soft-tissue]
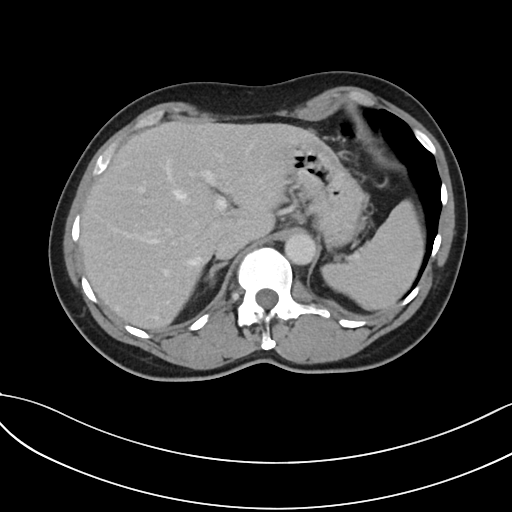
[im 83/88  soft-tissue]
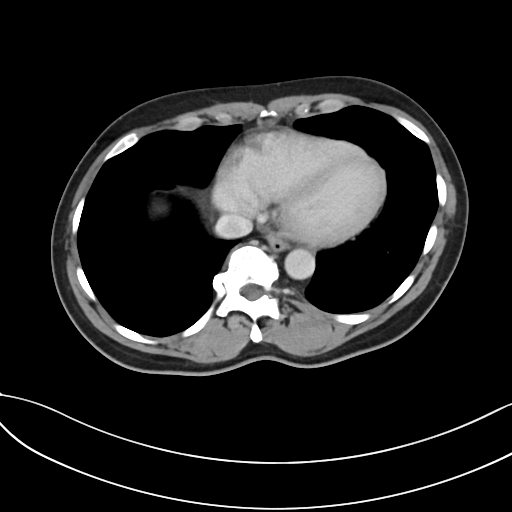

[Series 5: coronal · coronal · 0.71mm/px · 3 of 89 slices shown]
[im 30/89  soft-tissue]
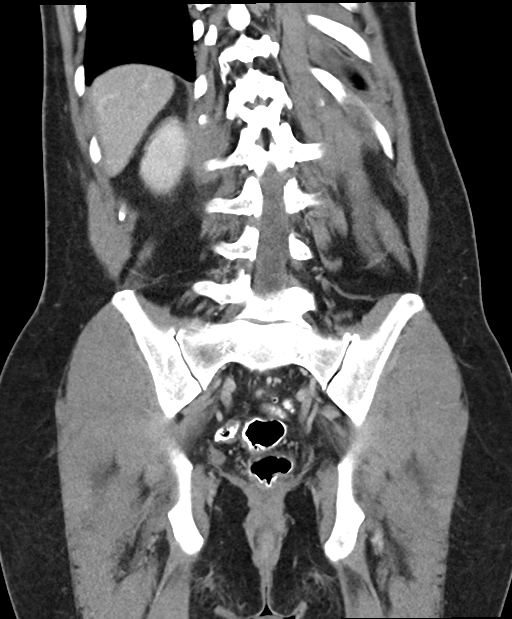
[im 40/89  soft-tissue]
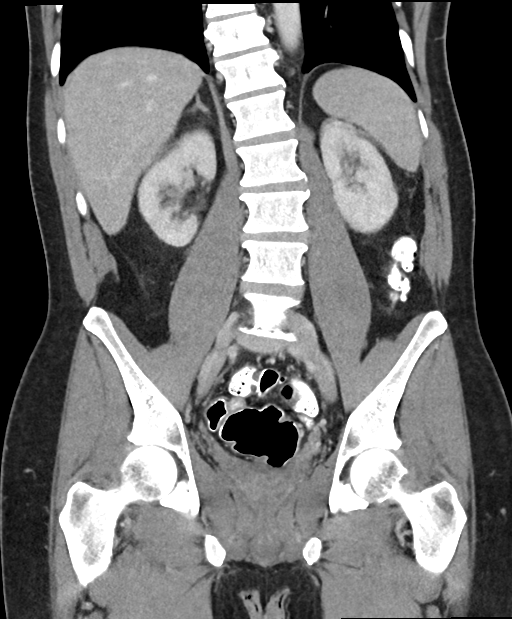
[im 49/89  soft-tissue]
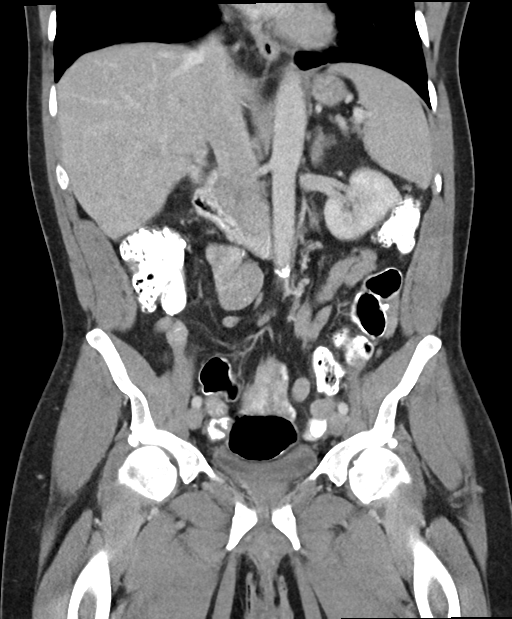

[16 of 46 positions shown; findings below may reference images not displayed]

FINDINGS: Lower chest: Unremarkable

Hepatobiliary: 1.0 by 0.5 cm hypodense lesion in the dome of the
liver on image 9 of series 2 previously measured 1.0 by 0.6 cm by my
measurements, with similar morphology.

Indistinct hypodensity measuring about 0.8 cm in diameter on image
29 series 2, likewise stable.

Residua from other prior hepatic metastatic lesions not readily
appreciable. No new hepatic lesion identified. Gallbladder
unremarkable.

Pancreas: Unremarkable

Spleen: Unremarkable

Adrenals/Urinary Tract: Unremarkable

Stomach/Bowel: Prior right hemicolectomy. Scattered diverticula the
descending colon. No dilated bowel.

Vascular/Lymphatic: Atherosclerosis is present, including aortoiliac
atherosclerotic disease. No pathologic adenopathy is identified.

Reproductive: Unremarkable

Other: No supplemental non-categorized findings.

Musculoskeletal: Mild to moderate levoconvex lumbar scoliosis. Mild
spurring of the femoral heads and acetabula.
IMPRESSION: 1. Faintly seen residua of prior hepatic metastatic disease seen at
two sites, without progression. No new findings of metastatic
disease identified.
2.  Aortic Atherosclerosis ([5F]-[5F]).
3. Scattered diverticula of the descending colon.
4. Mild to moderate levoconvex lumbar scoliosis.

## 2021-01-22 MED ORDER — HEPARIN SOD (PORK) LOCK FLUSH 100 UNIT/ML IV SOLN
500.0000 [IU] | Freq: Once | INTRAVENOUS | Status: AC | PRN
  Administered 2021-01-22: 500 [IU]

## 2021-01-22 MED ORDER — SODIUM CHLORIDE 0.9% FLUSH
10.0000 mL | INTRAVENOUS | Status: DC | PRN
  Administered 2021-01-22: 10 mL

## 2021-01-22 MED ORDER — IOHEXOL 300 MG/ML  SOLN
75.0000 mL | Freq: Once | INTRAMUSCULAR | Status: AC | PRN
  Administered 2021-01-22: 75 mL via INTRAVENOUS

## 2021-01-22 NOTE — Progress Notes (Signed)
[facility] OFFICE PROGRESS NOTE   Diagnosis: Colon cancer  INTERVAL HISTORY:   [name] returns as scheduled.  He completed a cycle of FOLFIRI 01/01/2021.  No significant nausea.  No vomiting.  No mouth sores.  No diarrhea.  About a week ago he was "sick" for 3 days with congestion, cough.  No fever.  He took a home COVID test which she reports was negative.  Symptoms are improving.  A few days ago he noticed a "cyst" along the left neck, mild associated discomfort.  No dysphagia.  Objective:  Vital signs in last 24 hours:  Blood pressure 127/81, pulse 88, temperature 98.2 F (36.8 C), temperature source Oral, resp. rate 18, height _0  (1.778 m), weight 152 lb 6.4 oz (69.1 kg), SpO2 99 %.    HEENT: No thrush or ulcers. Lymphatics: Approximate 1 cm high left anterior cervical lymph node, associated tenderness. Resp: Lungs clear bilaterally. Cardio: Regular rate and rhythm. GI: Abdomen soft and nontender.  No hepatomegaly. Vascular: No leg edema. Skin: Palms dry appearing, no erythema. Port-A-Cath without erythema.  Lab Results:  Lab Results  Component Value Date   WBC 5.1 01/22/2021   HGB 11.8 (L) 01/22/2021   HCT 38.9 (L) 01/22/2021   MCV 83.1 01/22/2021   PLT 193 01/22/2021   NEUTROABS 2.5 01/22/2021    Imaging:  No results found.  Medications: I have reviewed the patient's current medications.  Assessment/Plan: Moderately differentiated adenocarcinoma ascending colon, stage IIIb (pT3pN1), status post a right colectomy 11/19/2018 Lymphovascular and perineural invasion present, 2/14 lymph nodes positive, tumor deposits present Positive radial margin, no loss of mismatch repair protein expression, discussed case with Dr. Craig Staggers mass with surrounding inflammation at multiple margins, no gross residual disease, unclear which is the "positive "radial margin, further surgery and radiation not recommended Colonoscopy 11/19/2018-completely  obstructing mid ascending colon mass, could not be passed with endoscope, biopsy confirmed invasive adenocarcinoma CT abdomen/pelvis 11/18/2018-wall thickening at the mid and distal ascending colon with mild distention of the proximal ascending colon and cecum CTs 10/17/2018--no acute findings, no chest lymphadenopathy, lungs clear Cycle 1 FOLFOX 12/28/2018 Cycle 2 FOLFOX 01/11/2019, Udenyca added  Cycle 3 FOLFOX 01/25/2019, Udenyca held due to bone pain Cycle 4 FOLFOX 02/21/2019, Udenyca added Cycle 5 FOLFOX 03/09/2019, Udenyca Cycle 6 FOLFOX 03/21/2019, Udenyca Cycle 7 FOLFOX 04/04/2019, Udenyca Cycle 8 FOLFOX 04/18/2019, Udenyca  Cycle 9 FOLFOX 05/02/2019, Udenyca Cycle 10 FOLFOX 05/16/2019, oxaliplatin, 5-FU bolus, and Udenyca held Cycle 11 FOLFOX 05/30/2019, oxaliplatin, 5-FU bolus, and Udenyca held Cycle 12 FOLFOX 06/15/2019, oxaliplatin, 5-FU bolus and Udenyca held CTs 09/29/2019-new hypodense enhancing liver masses consistent with metastatic disease, indistinct marginated nodularity below the pancreas head-likely small lymph nodes Biopsy liver lesion 10/18/2019-adenocarcinoma consistent with history of colorectal carcinoma Cycle 1 FOLFIRI/Avastin 11/01/2019 Cycle 2 FOLFIRI/Avastin 11/15/2019 Cycle 3 FOLFIRI/Avastin 11/29/2019 Cycle 4 FOLFIRI/Avastin 12/13/2019 CT chest 12/27/2019-no significant change in hepatic metastases, submassive pulmonary emboli Cycle 5 FOLFIRI 01/10/2020 (Avastin discontinued) Cycle 6 FOLFIRI 02/01/2020 Cycle 7 FOLFIRI 02/13/2020 Cycle 8 FOLFIRI 02/27/2020 CTs abdomen/pelvis 03/09/2020-per Dr. Gearldine Shown review overall stable disease Cycle 9 FOLFIRI 04/16/2020 Cycle 10 FOLFIRI 05/01/2020 Cycle 11 FOLFIRI 05/15/2020 Cycle 12 FOLFIRI 05/29/2020 CT abdomen/pelvis 06/07/2020- decreased size of liver lesions, no new lesions Cycle 13 FOLFIRI 06/12/2020 Cycle 14 FOLFIRI 07/10/2020 Cycle 15 FOLFIRI 07/24/2020 Cycle 16 FOLFIRI 08/07/2020 Cycle 17 FOLFIRI 08/21/2020 Cycle 18 FOLFIRI  09/04/2020 CT abdomen/pelvis 09/14/2020-stable faint liver lesions, no new lesions, no new site of metastatic disease Cycle 19 09/18/2020  Treatment  held 10/09/2020 due to upper respiratory symptoms, known COVID exposure Cycle 20 FOLFIRI 10/30/2020 Cycle 21 FOLFIRI 11/20/2020 Cycle 22 FOLFIRI 12/12/2020 Cycle 23 FOLFIRI 01/01/2021, Emend added CTs 01/22/2021-faintly seen residual of prior hepatic metastatic disease seen at 2 sites, no progression.  No new findings of metastatic disease. FOLFIRI placed on hold, referred for liver MRI   Deaf Right epididymal cyst removal 03/22/2018 Asthma Port-A-Cath placement, [name], 12/23/2018 Neutropenia secondary to chemotherapy-Udenyca added for cycle 2 FOLFOX Admission with febrile neutropenia 02/08/2019 Hospital admission with submassive PE and left lower extremity DVT on 12/27/2019, heparin, transition to Xarelto 12/28/2019 CT chest 12/27/2019-submassive pulmonary emboli with evidence of right heart strain, no significant change in hepatic metastases      Disposition: [name] appears stable.  He is on active treatment with FOLFIRI.  Restaging CTs from earlier today show 2 sites of metastatic disease involving the liver, no evidence of progression, no new findings of metastatic disease.  Dr. Benay Spice reviewed the CT images with [name] at today's visit.  Dr. Gearldine Shown recommendation is to place the current treatment on hold and refer for MRI of the liver.  [name] agrees with this plan.  He may have an enlarged lymph node related to the recent URI or a cyst at the left neck.  Observe for now.  He will return for follow-up in approximately 2 weeks.  Patient seen with Dr. Benay Spice.  Ned Card ANP/GNP-BC   01/22/2021  10:50 AM  This was a shared visit with Ned Card.  [name] was interviewed and examined.  We reviewed the restaging CT findings and images with [name].  A sign language interpreter was present for today's  visit.  The restaging CT reveals to stable small liver lesions and no other evidence of metastatic disease. We discussed treatment options including continuation of FOLFIRI, a treatment break, and switching to a maintenance regimen.  We decided to refer him for an RI of the liver to see whether he may be a candidate for hepatic directed therapy.  FOLFIRI chemotherapy will be placed on hold for now.  I was present for greater than 50% of today's visit.  I performed medical decision making.  Julieanne Manson, MD

## 2021-01-22 NOTE — Patient Instructions (Signed)

## 2021-01-22 NOTE — Progress Notes (Signed)
Patient did not have interpreter with him during visit.  This RN communicated with patient with a dry erase board.

## 2021-01-24 ENCOUNTER — Inpatient Hospital Stay: Payer: Medicare Other

## 2021-01-24 ENCOUNTER — Other Ambulatory Visit: Payer: Self-pay | Admitting: *Deleted

## 2021-01-24 DIAGNOSIS — C182 Malignant neoplasm of ascending colon: Secondary | ICD-10-CM

## 2021-01-29 ENCOUNTER — Other Ambulatory Visit: Payer: Self-pay

## 2021-01-29 ENCOUNTER — Ambulatory Visit (HOSPITAL_COMMUNITY)
Admission: RE | Admit: 2021-01-29 | Discharge: 2021-01-29 | Disposition: A | Discharge status: Home / Self Care | Payer: Medicare Other | Source: Ambulatory Visit | Attending: Nurse Practitioner | Admitting: Nurse Practitioner

## 2021-01-29 DIAGNOSIS — C189 Malignant neoplasm of colon, unspecified: Secondary | ICD-10-CM | POA: Diagnosis not present

## 2021-01-29 DIAGNOSIS — C182 Malignant neoplasm of ascending colon: Secondary | ICD-10-CM | POA: Diagnosis not present

## 2021-01-29 DIAGNOSIS — K769 Liver disease, unspecified: Secondary | ICD-10-CM | POA: Diagnosis not present

## 2021-01-29 DIAGNOSIS — C787 Secondary malignant neoplasm of liver and intrahepatic bile duct: Secondary | ICD-10-CM | POA: Diagnosis not present

## 2021-01-29 IMAGING — MR MR ABDOMEN WO/W CM
18 series · 48 of 48 positions shown · IV contrast (7ml GADAVIST)
Comparison: CT abdomen [DATE]

CLINICAL DATA: Colon cancer, metastases follow-up

EXAM:
MRI ABDOMEN WITHOUT AND WITH CONTRAST
TECHNIQUE: Multiplanar multisequence MR imaging of the abdomen was performed
both before and after the administration of intravenous contrast.
CONTRAST:  7mL GADAVIST GADOBUTROL 1 MMOL/ML IV SOLN

[Series 3: cor haste · coronal · 6.0mm · 1.56mm/px · 2 of 33 slices shown]
[im 1/33]
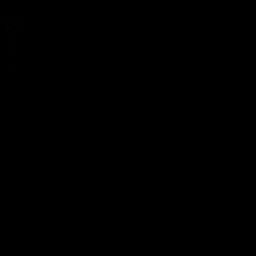
[im 33/33]
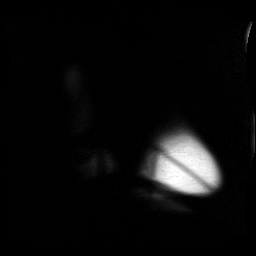

[Series 4: bSSFP · axial · 6.0mm · 0.94mm/px · z∈[-232,+32]mm · 3 of 45 slices shown]
[im 1/45]
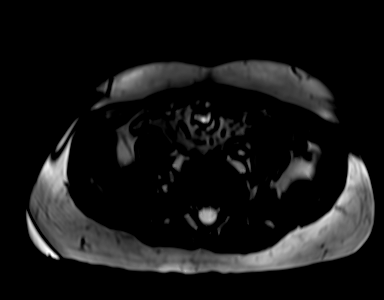
[im 23/45]
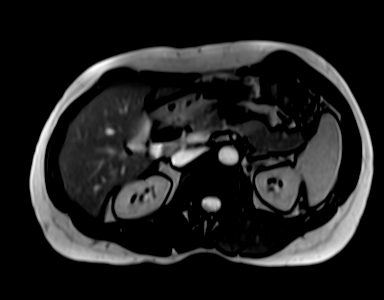
[im 45/45]
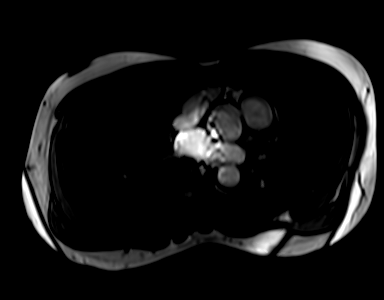

[Series 5: T2 fat-sat · axial · 6.0mm · 1.19mm/px · 1 of 36 slices shown]
[im 1/36]
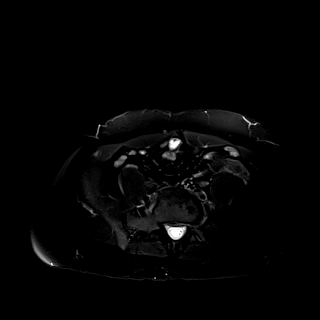

[Series 7: DWI · axial · 6.0mm · 1.36mm/px · z∈[-232,+41]mm · 3 of 78 slices shown (1 of 2)]
[im 1/78]
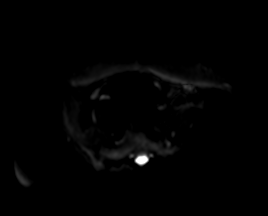
[im 39/78]
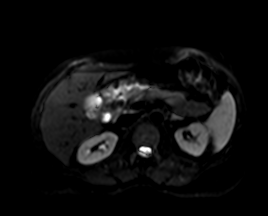
[im 78/78]
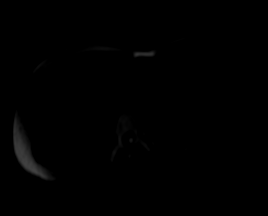

[Series 8: DWI · axial · 6.0mm · 1.36mm/px · z∈[-232,+41]mm · 2 of 39 slices shown (2 of 2)]
[im 1/39]
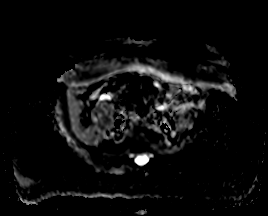
[im 39/39]
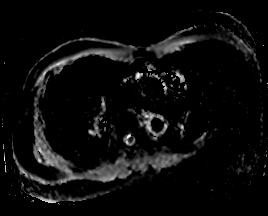

[Series 9: T1 dynamic · axial · 3.0mm · 1.14mm/px · z∈[-205,+32]mm · 3 of 80 slices shown (1 of 9)]
[im 1/80]
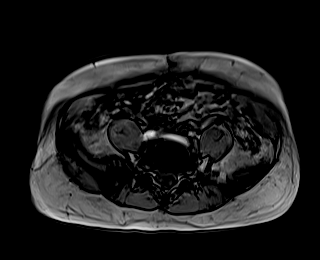
[im 40/80]
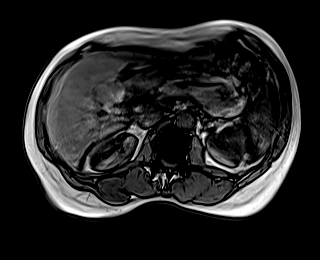
[im 80/80]
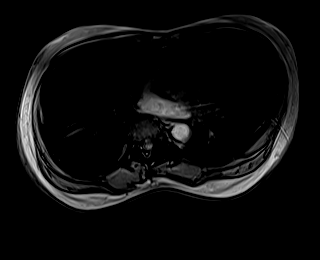

[Series 10: T1 dynamic · axial · 3.0mm · 1.14mm/px · z∈[-205,+32]mm · 3 of 80 slices shown (2 of 9)]
[im 1/80]
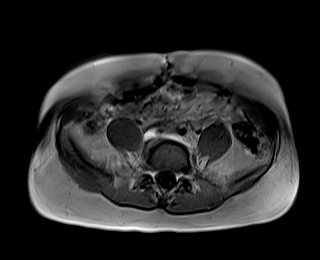
[im 40/80]
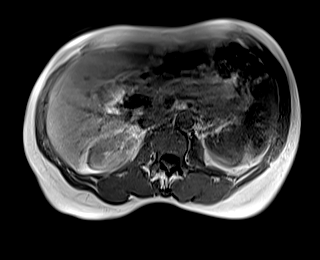
[im 80/80]
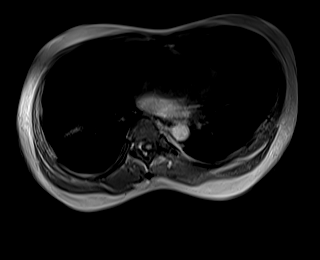

[Series 17: T1 dynamic · axial · 3.0mm · 1.14mm/px · z∈[-205,+32]mm · 3 of 80 slices shown (3 of 9)]
[im 1/80]
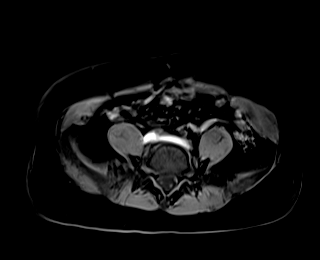
[im 40/80]
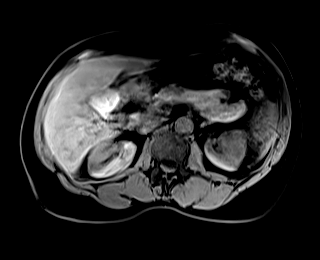
[im 80/80]
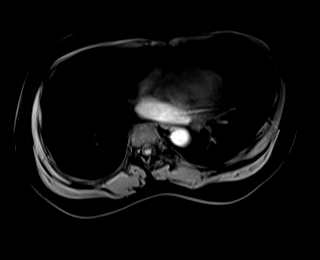

[Series 19: T1 dynamic · axial · 3.0mm · 1.14mm/px · z∈[-205,+32]mm · 3 of 80 slices shown (4 of 9)]
[im 1/80]
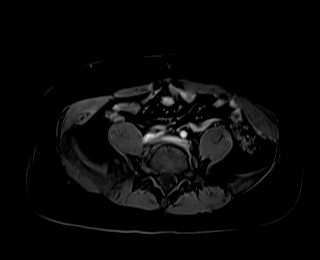
[im 40/80]
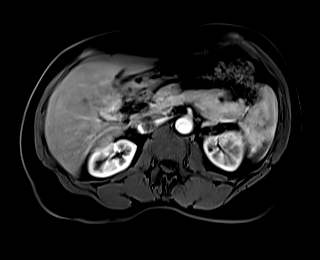
[im 80/80]
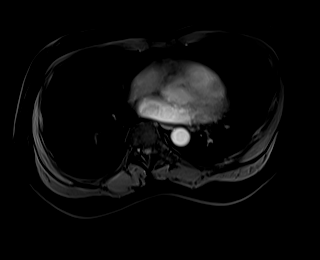

[Series 20: T1 dynamic · axial · 3.0mm · 1.14mm/px · z∈[-205,+32]mm · 3 of 80 slices shown (5 of 9)]
[im 1/80]
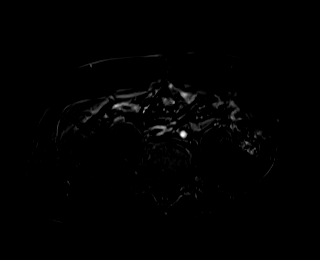
[im 40/80]
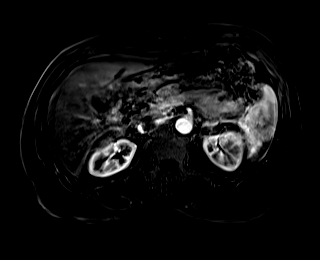
[im 80/80]
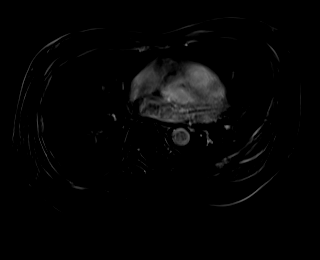

[Series 22: T1 dynamic · axial · 3.0mm · 1.14mm/px · z∈[-205,+32]mm · 3 of 80 slices shown (6 of 9)]
[im 1/80]
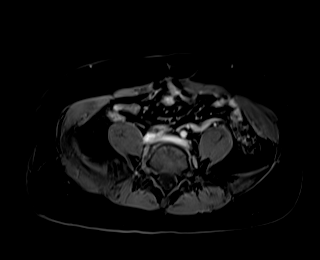
[im 40/80]
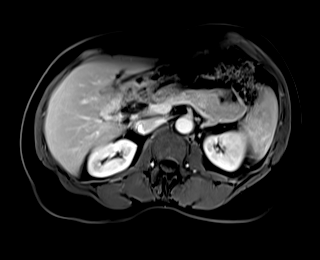
[im 80/80]
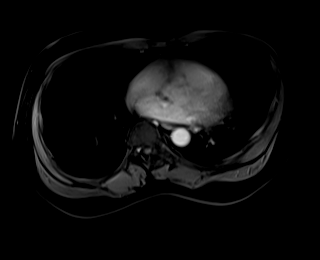

[Series 23: T1 dynamic · axial · 3.0mm · 1.14mm/px · z∈[-205,+32]mm · 3 of 80 slices shown (7 of 9)]
[im 1/80]
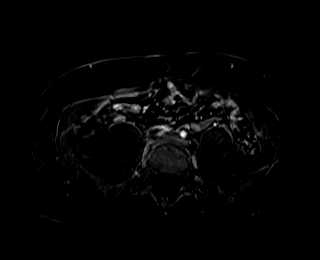
[im 40/80]
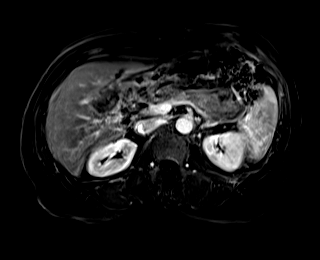
[im 80/80]
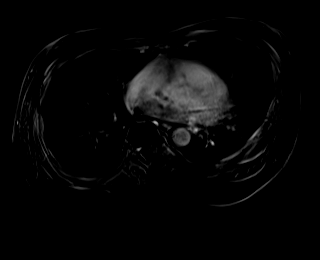

[Series 25: T1 dynamic · axial · 3.0mm · 1.14mm/px · z∈[-205,+32]mm · 3 of 80 slices shown (8 of 9)]
[im 1/80]
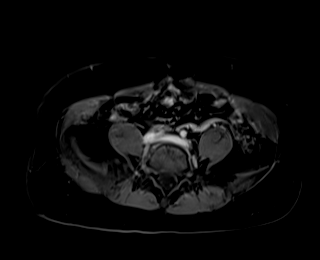
[im 40/80]
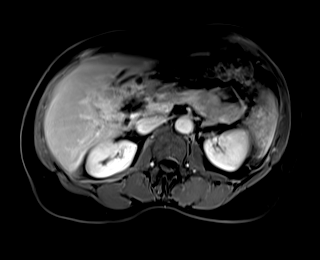
[im 80/80]
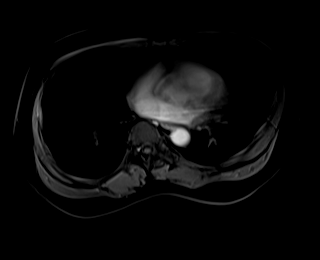

[Series 26: T1 dynamic · axial · 3.0mm · 1.14mm/px · z∈[-205,+32]mm · 3 of 80 slices shown (9 of 9)]
[im 1/80]
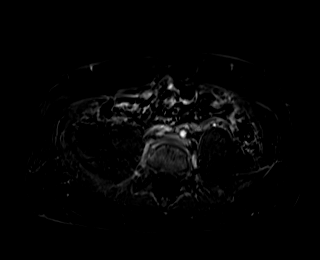
[im 40/80]
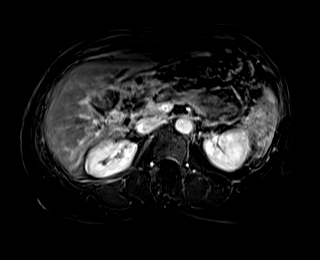
[im 80/80]
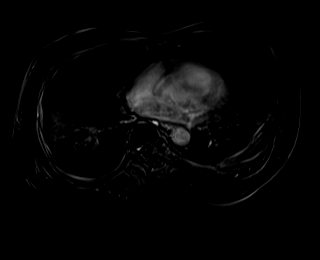

[Series 27: ax_haste_mbh · axial · 6.0mm · 1.14mm/px · 1 of 35 slices shown]
[im 1/35]
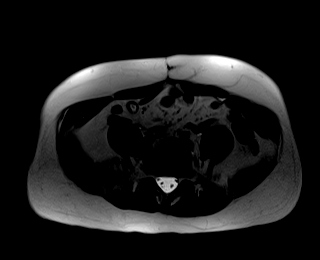

[Series 29: cor_vibe_dixon_delayed_w · coronal · 3.0mm · 2.34mm/px · 3 of 72 slices shown]
[im 1/72]
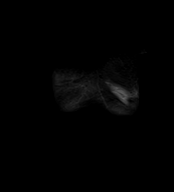
[im 36/72]
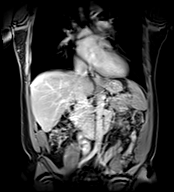
[im 72/72]
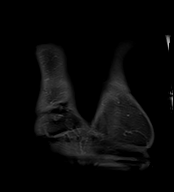

[Series 31: ax_dixon_delayed_w_reg · axial · 3.0mm · 1.14mm/px · z∈[-205,+32]mm · 3 of 80 slices shown]
[im 1/80]
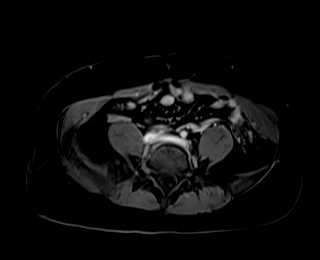
[im 40/80]
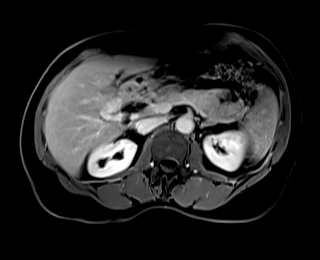
[im 80/80]
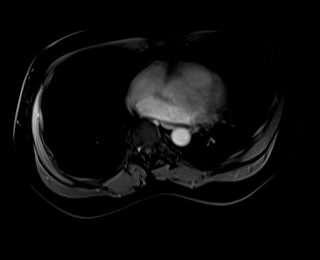

[Series 32: ax_dixon_delayed_w_reg_sub · axial · 3.0mm · 1.14mm/px · z∈[-205,+32]mm · 3 of 80 slices shown]
[im 1/80]
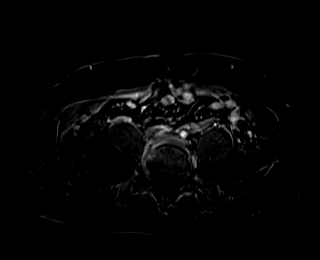
[im 40/80]
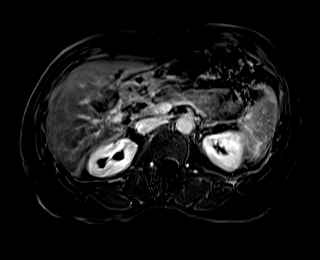
[im 80/80]
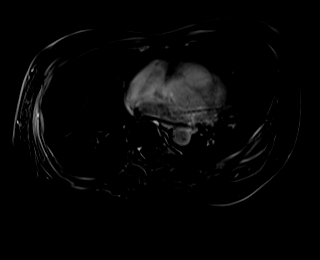

[48 of 48 positions shown; findings below may reference images not displayed]

FINDINGS: Study is significantly limited due to motion.

Lower chest: No acute findings.

Hepatobiliary: Liver is normal in size and contour. No evidence of
hepatic steatosis. No definite enhancing hepatic lesions identified.
9 mm hyperintense T2 signal lesion near the dome of the right
hepatic lobe and 7 mm hyperintense T2 signal lesion in the more
inferior right hepatic lobe segment 6. Gallbladder appears grossly
normal. No biliary ductal dilatation identified.

Pancreas: No mass, inflammatory changes, or other parenchymal
abnormality identified.

Spleen:  Within normal limits in size and appearance.

Adrenals/Urinary Tract: No masses identified. No evidence of
hydronephrosis.

Stomach/Bowel: Visualized portions within the abdomen are
unremarkable.

Vascular/Lymphatic: No pathologically enlarged lymph nodes
identified. No abdominal aortic aneurysm demonstrated.

Other:  No ascites.

Musculoskeletal: No suspicious bone lesions identified.
IMPRESSION: 1. No enhancing hepatic lesions identified. Two subcentimeter
hyperintense T2 signal lesions in the right lobe consistent with
residual prior hepatic metastases. Evaluation is limited due to
motion.
2. No other mass or lymphadenopathy visualized in the abdomen.

## 2021-01-29 MED ORDER — GADOBUTROL 1 MMOL/ML IV SOLN
7.0000 mL | Freq: Once | INTRAVENOUS | Status: AC | PRN
  Administered 2021-01-29: 7 mL via INTRAVENOUS

## 2021-02-04 ENCOUNTER — Other Ambulatory Visit (INDEPENDENT_AMBULATORY_CARE_PROVIDER_SITE_OTHER): Payer: Medicare Other

## 2021-02-12 ENCOUNTER — Encounter: Payer: Self-pay | Admitting: Nutrition

## 2021-02-12 ENCOUNTER — Other Ambulatory Visit (INDEPENDENT_AMBULATORY_CARE_PROVIDER_SITE_OTHER): Payer: Medicare Other

## 2021-02-12 ENCOUNTER — Inpatient Hospital Stay: Payer: Medicare Other

## 2021-02-12 ENCOUNTER — Other Ambulatory Visit: Payer: Self-pay

## 2021-02-12 ENCOUNTER — Encounter: Payer: Self-pay | Admitting: *Deleted

## 2021-02-12 ENCOUNTER — Inpatient Hospital Stay (HOSPITAL_BASED_OUTPATIENT_CLINIC_OR_DEPARTMENT_OTHER): Payer: Medicare Other | Admitting: Oncology

## 2021-02-12 VITALS — BP 136/86 | HR 93 | Temp 97.9°F | Resp 18 | Ht 70.0 in | Wt 156.0 lb

## 2021-02-12 DIAGNOSIS — C787 Secondary malignant neoplasm of liver and intrahepatic bile duct: Secondary | ICD-10-CM | POA: Insufficient documentation

## 2021-02-12 DIAGNOSIS — Z95828 Presence of other vascular implants and grafts: Secondary | ICD-10-CM

## 2021-02-12 DIAGNOSIS — Z86711 Personal history of pulmonary embolism: Secondary | ICD-10-CM | POA: Diagnosis not present

## 2021-02-12 DIAGNOSIS — C182 Malignant neoplasm of ascending colon: Secondary | ICD-10-CM | POA: Diagnosis not present

## 2021-02-12 LAB — CBC WITH DIFFERENTIAL (CANCER CENTER ONLY)
Abs Immature Granulocytes: 0.01 10*3/uL (ref 0.00–0.07)
Basophils Absolute: 0 10*3/uL (ref 0.0–0.1)
Basophils Relative: 1 %
Eosinophils Absolute: 0.3 10*3/uL (ref 0.0–0.5)
Eosinophils Relative: 6 %
HCT: 37.8 % — ABNORMAL LOW (ref 39.0–52.0)
Hemoglobin: 11.4 g/dL — ABNORMAL LOW (ref 13.0–17.0)
Immature Granulocytes: 0 %
Lymphocytes Relative: 29 %
Lymphs Abs: 1.3 10*3/uL (ref 0.7–4.0)
MCH: 24.8 pg — ABNORMAL LOW (ref 26.0–34.0)
MCHC: 30.2 g/dL (ref 30.0–36.0)
MCV: 82.2 fL (ref 80.0–100.0)
Monocytes Absolute: 0.5 10*3/uL (ref 0.1–1.0)
Monocytes Relative: 10 %
Neutro Abs: 2.4 10*3/uL (ref 1.7–7.7)
Neutrophils Relative %: 54 %
Platelet Count: 164 10*3/uL (ref 150–400)
RBC: 4.6 MIL/uL (ref 4.22–5.81)
RDW: 18.6 % — ABNORMAL HIGH (ref 11.5–15.5)
WBC Count: 4.5 10*3/uL (ref 4.0–10.5)
nRBC: 0 % (ref 0.0–0.2)

## 2021-02-12 LAB — CMP (CANCER CENTER ONLY)
ALT: 14 U/L (ref 0–44)
AST: 18 U/L (ref 15–41)
Albumin: 4.1 g/dL (ref 3.5–5.0)
Alkaline Phosphatase: 47 U/L (ref 38–126)
Anion gap: 7 (ref 5–15)
BUN: 13 mg/dL (ref 6–20)
CO2: 25 mmol/L (ref 22–32)
Calcium: 8.9 mg/dL (ref 8.9–10.3)
Chloride: 108 mmol/L (ref 98–111)
Creatinine: 1.2 mg/dL (ref 0.61–1.24)
GFR, Estimated: >60 mL/min (ref >60)
Glucose, Bld: 98 mg/dL (ref 70–99)
Potassium: 4 mmol/L (ref 3.5–5.1)
Sodium: 140 mmol/L (ref 135–145)
Total Bilirubin: 0.4 mg/dL (ref 0.3–1.2)
Total Protein: 6.8 g/dL (ref 6.5–8.1)

## 2021-02-12 MED ORDER — HEPARIN SOD (PORK) LOCK FLUSH 100 UNIT/ML IV SOLN
500.0000 [IU] | Freq: Once | INTRAVENOUS | Status: AC | PRN
  Administered 2021-02-12: 500 [IU]

## 2021-02-12 MED ORDER — SODIUM CHLORIDE 0.9% FLUSH
10.0000 mL | INTRAVENOUS | Status: AC | PRN
  Administered 2021-02-12: 10 mL

## 2021-02-12 NOTE — Progress Notes (Signed)
Provided patient with 1 case of Ensure.

## 2021-02-12 NOTE — Progress Notes (Signed)
[facility] OFFICE PROGRESS NOTE   Diagnosis: Colon cancer  INTERVAL HISTORY:   Alexander Reeves returns as scheduled.  He is here with a sign language interpreter.  No new complaint.  Objective:  Vital signs in last 24 hours:  Blood pressure 136/86, pulse 93, temperature 97.9 F (36.6 C), temperature source Oral, resp. rate 18, height '5\' 10"'  (1.778 m), weight 156 lb (70.8 kg), SpO2 99 %.    Lymphatics: No cervical, supraclavicular, axillary, or inguinal nodes Resp: Lungs clear bilaterally Cardio: Regular rate and rhythm GI: No hepatosplenomegaly Vascular: No leg edema  Skin: Hyperpigmentation of the hands  Portacath/PICC-without erythema  Lab Results:  Lab Results  Component Value Date   WBC 4.5 02/12/2021   HGB 11.4 (L) 02/12/2021   HCT 37.8 (L) 02/12/2021   MCV 82.2 02/12/2021   PLT 164 02/12/2021   NEUTROABS 2.4 02/12/2021    CMP  Lab Results  Component Value Date   NA 140 02/12/2021   K 4.0 02/12/2021   CL 108 02/12/2021   CO2 25 02/12/2021   GLUCOSE 98 02/12/2021   BUN 13 02/12/2021   CREATININE 1.20 02/12/2021   CALCIUM 8.9 02/12/2021   PROT 6.8 02/12/2021   ALBUMIN 4.1 02/12/2021   AST 18 02/12/2021   ALT 14 02/12/2021   ALKPHOS 47 02/12/2021   BILITOT 0.4 02/12/2021   GFRNONAA >60 02/12/2021   GFRAA >60 09/28/2019    Lab Results  Component Value Date   CEA1 1.75 09/28/2019     Medications: I have reviewed the patient's current medications.   Assessment/Plan:  Moderately differentiated adenocarcinoma ascending colon, stage IIIb (pT3pN1), status post a right colectomy 11/19/2018 Lymphovascular and perineural invasion present, 2/14 lymph nodes positive, tumor deposits present Positive radial margin, no loss of mismatch repair protein expression, discussed case with Dr. Craig Reeves mass with surrounding inflammation at multiple margins, no gross residual disease, unclear which is the "positive "radial margin, further  surgery and radiation not recommended Colonoscopy 11/19/2018-completely obstructing mid ascending colon mass, could not be passed with endoscope, biopsy confirmed invasive adenocarcinoma CT abdomen/pelvis 11/18/2018-wall thickening at the mid and distal ascending colon with mild distention of the proximal ascending colon and cecum CTs 10/17/2018--no acute findings, no chest lymphadenopathy, lungs clear Cycle 1 FOLFOX 12/28/2018 Cycle 2 FOLFOX 01/11/2019, Udenyca added  Cycle 3 FOLFOX 01/25/2019, Udenyca held due to bone pain Cycle 4 FOLFOX 02/21/2019, Udenyca added Cycle 5 FOLFOX 03/09/2019, Udenyca Cycle 6 FOLFOX 03/21/2019, Udenyca Cycle 7 FOLFOX 04/04/2019, Udenyca Cycle 8 FOLFOX 04/18/2019, Udenyca  Cycle 9 FOLFOX 05/02/2019, Udenyca Cycle 10 FOLFOX 05/16/2019, oxaliplatin, 5-FU bolus, and Udenyca held Cycle 11 FOLFOX 05/30/2019, oxaliplatin, 5-FU bolus, and Udenyca held Cycle 12 FOLFOX 06/15/2019, oxaliplatin, 5-FU bolus and Udenyca held CTs 09/29/2019-new hypodense enhancing liver masses consistent with metastatic disease, indistinct marginated nodularity below the pancreas head-likely small lymph nodes Biopsy liver lesion 10/18/2019-adenocarcinoma consistent with history of colorectal carcinoma Cycle 1 FOLFIRI/Avastin 11/01/2019 Cycle 2 FOLFIRI/Avastin 11/15/2019 Cycle 3 FOLFIRI/Avastin 11/29/2019 Cycle 4 FOLFIRI/Avastin 12/13/2019 CT chest 12/27/2019-no significant change in hepatic metastases, submassive pulmonary emboli Cycle 5 FOLFIRI 01/10/2020 (Avastin discontinued) Cycle 6 FOLFIRI 02/01/2020 Cycle 7 FOLFIRI 02/13/2020 Cycle 8 FOLFIRI 02/27/2020 CTs abdomen/pelvis 03/09/2020-per Dr. Gearldine Reeves review overall stable disease Cycle 9 FOLFIRI 04/16/2020 Cycle 10 FOLFIRI 05/01/2020 Cycle 11 FOLFIRI 05/15/2020 Cycle 12 FOLFIRI 05/29/2020 CT abdomen/pelvis 06/07/2020- decreased size of liver lesions, no new lesions Cycle 13 FOLFIRI 06/12/2020 Cycle 14 FOLFIRI 07/10/2020 Cycle 15 FOLFIRI 07/24/2020 Cycle 16  FOLFIRI 08/07/2020 Cycle 17 FOLFIRI  08/21/2020 Cycle 18 FOLFIRI 09/04/2020 CT abdomen/pelvis 09/14/2020-stable faint liver lesions, no new lesions, no new site of metastatic disease Cycle 19 09/18/2020  Treatment held 10/09/2020 due to upper respiratory symptoms, known COVID exposure Cycle 20 FOLFIRI 10/30/2020 Cycle 21 FOLFIRI 11/20/2020 Cycle 22 FOLFIRI 12/12/2020 Cycle 23 FOLFIRI 01/01/2021, Emend added CTs 01/22/2021-faintly seen residual of prior hepatic metastatic disease seen at 2 sites, no progression.  No new findings of metastatic disease. FOLFIRI placed on hold, referred for liver MRI MRI abdomen 01/29/2021-no enhancing hepatic lesions, 9 mm hyperintense T2 signal lesion near the dome of the right hepatic lobe and 7 mm hyperintense T2 signal lesion in the inferior right hepatic lobe segment 6, no other evidence of metastatic disease   Deaf Right epididymal cyst removal 03/22/2018 Asthma Port-A-Cath placement, Dr. Donne Reeves, 12/23/2018 Neutropenia secondary to chemotherapy-Udenyca added for cycle 2 FOLFOX Admission with febrile neutropenia 02/08/2019 Hospital admission with submassive PE and left lower extremity DVT on 12/27/2019, heparin, transition to Xarelto 12/28/2019 CT chest 12/27/2019-submassive pulmonary emboli with evidence of right heart strain, no significant change in hepatic metastases       Disposition: [name] appears stable.  The liver MRI confirms 2 hepatic lesions and no other evidence of metastatic disease.  FOLFIRI will remain on hold.  His case will be presented at the GI tumor conference to discuss the indication for hepatic directed therapy.  He may be a candidate for ablation, SRS, or resection of the remaining liver metastases.  He will return for an office visit in 3 weeks.  Betsy Coder, MD  02/12/2021  11:06 AM

## 2021-02-12 NOTE — Patient Instructions (Signed)

## 2021-02-12 NOTE — Addendum Note (Signed)
Addended by: Lenox Ponds E on: 02/12/2021 11:35 AM   Modules accepted: Orders

## 2021-02-12 NOTE — Progress Notes (Signed)
RN provided one complimentary case of ensure plus today.

## 2021-02-13 ENCOUNTER — Encounter: Payer: Self-pay | Admitting: Oncology

## 2021-02-14 ENCOUNTER — Inpatient Hospital Stay: Payer: Medicare Other

## 2021-02-18 ENCOUNTER — Other Ambulatory Visit (INDEPENDENT_AMBULATORY_CARE_PROVIDER_SITE_OTHER): Payer: Self-pay | Admitting: Primary Care

## 2021-02-18 ENCOUNTER — Telehealth (INDEPENDENT_AMBULATORY_CARE_PROVIDER_SITE_OTHER): Payer: Self-pay | Admitting: Primary Care

## 2021-02-18 NOTE — Telephone Encounter (Signed)
Pt needs [facility] to pick him up for his lab appointment tomorrow. Pt would like a phone call to confirm pick up time.

## 2021-02-18 NOTE — Telephone Encounter (Signed)
Please set up transportation for patient.

## 2021-02-19 ENCOUNTER — Other Ambulatory Visit (INDEPENDENT_AMBULATORY_CARE_PROVIDER_SITE_OTHER): Payer: Self-pay | Admitting: Primary Care

## 2021-02-19 ENCOUNTER — Other Ambulatory Visit: Payer: Self-pay

## 2021-02-19 ENCOUNTER — Other Ambulatory Visit (INDEPENDENT_AMBULATORY_CARE_PROVIDER_SITE_OTHER): Payer: Medicare Other

## 2021-02-19 DIAGNOSIS — E785 Hyperlipidemia, unspecified: Secondary | ICD-10-CM

## 2021-02-20 LAB — LIPID PANEL
Chol/HDL Ratio: 3.2 ratio (ref 0.0–5.0)
Cholesterol, Total: 157 mg/dL (ref 100–199)
HDL: 49 mg/dL (ref >39)
LDL Chol Calc (NIH): 89 mg/dL (ref 0–99)
Triglycerides: 105 mg/dL (ref 0–149)
VLDL Cholesterol Cal: 19 mg/dL (ref 5–40)

## 2021-02-22 ENCOUNTER — Telehealth (INDEPENDENT_AMBULATORY_CARE_PROVIDER_SITE_OTHER): Payer: Self-pay

## 2021-02-22 NOTE — Telephone Encounter (Signed)
-----   Message from [name], NP sent at 02/21/2021  1:42 PM EST ----- Cholesterol is normal -continue  Healthy lifestyle diet of fruits vegetables fish nuts whole grains and low saturated fat . Foods high in cholesterol or liver, fatty meats,cheese, butter avocados, nuts and seeds, chocolate and fried foods.

## 2021-02-22 NOTE — Telephone Encounter (Signed)
Patient is aware that cholesterol is normal. Nat Christen, CMA

## 2021-02-27 ENCOUNTER — Encounter: Payer: Self-pay | Admitting: *Deleted

## 2021-02-27 ENCOUNTER — Other Ambulatory Visit: Payer: Self-pay

## 2021-02-27 NOTE — Progress Notes (Signed)
Foundation one testing requested Accession number [id number]

## 2021-02-27 NOTE — Progress Notes (Signed)
The proposed treatment discussed in conference is for discussion purpose only and is not a binding recommendation.  The patients have not been physically examined, or presented with their treatment options.  Therefore, final treatment plans cannot be decided.  

## 2021-03-05 ENCOUNTER — Other Ambulatory Visit: Payer: Self-pay

## 2021-03-05 ENCOUNTER — Inpatient Hospital Stay: Payer: Medicare Other | Attending: Nurse Practitioner | Admitting: Nurse Practitioner

## 2021-03-05 ENCOUNTER — Encounter: Payer: Self-pay | Admitting: Nurse Practitioner

## 2021-03-05 VITALS — BP 141/82 | HR 88 | Temp 98.0°F | Resp 19 | Ht 70.0 in | Wt 158.4 lb

## 2021-03-05 DIAGNOSIS — C787 Secondary malignant neoplasm of liver and intrahepatic bile duct: Secondary | ICD-10-CM | POA: Diagnosis not present

## 2021-03-05 DIAGNOSIS — M62830 Muscle spasm of back: Secondary | ICD-10-CM | POA: Diagnosis not present

## 2021-03-05 DIAGNOSIS — Z86711 Personal history of pulmonary embolism: Secondary | ICD-10-CM | POA: Insufficient documentation

## 2021-03-05 DIAGNOSIS — C182 Malignant neoplasm of ascending colon: Secondary | ICD-10-CM

## 2021-03-05 DIAGNOSIS — R202 Paresthesia of skin: Secondary | ICD-10-CM | POA: Diagnosis not present

## 2021-03-05 NOTE — Progress Notes (Signed)
[facility] OFFICE PROGRESS NOTE   Diagnosis: Colon cancer  INTERVAL HISTORY:   [name] returns as scheduled.  He is accompanied by a sign language interpreter.  He feels well overall.  He has a good appetite.  He continues to have intermittent back spasms.  He notes less tingling in the fingertips.  Stable tingling in the feet.  He discontinued all medications following his last office visit.  Objective:  Vital signs in last 24 hours:  Blood pressure (!) 141/82, pulse 88, temperature 98 F (36.7 C), temperature source Oral, resp. rate 19, height _0  (1.778 m), weight 158 lb 6.4 oz (71.8 kg), SpO2 99 %.    HEENT: No thrush or ulcers. Resp: Lungs clear bilaterally. Cardio: Regular rate and rhythm. GI: Abdomen soft and nontender.  No hepatosplenomegaly. Vascular: No leg edema. Skin: Palms with hyperpigmentation, mild dryness. Port-A-Cath without erythema.   Lab Results:  Lab Results  Component Value Date   WBC 4.5 02/12/2021   HGB 11.4 (L) 02/12/2021   HCT 37.8 (L) 02/12/2021   MCV 82.2 02/12/2021   PLT 164 02/12/2021   NEUTROABS 2.4 02/12/2021    Imaging:  No results found.  Medications: I have reviewed the patient's current medications.  Assessment/Plan: Moderately differentiated adenocarcinoma ascending colon, stage IIIb (pT3pN1), status post a right colectomy 11/19/2018 Lymphovascular and perineural invasion present, 2/14 lymph nodes positive, tumor deposits present Positive radial margin, no loss of mismatch repair protein expression, discussed case with Dr. Craig Reeves mass with surrounding inflammation at multiple margins, no gross residual disease, unclear which is the "positive "radial margin, further surgery and radiation not recommended Colonoscopy 11/19/2018-completely obstructing mid ascending colon mass, could not be passed with endoscope, biopsy confirmed invasive adenocarcinoma CT abdomen/pelvis 11/18/2018-wall thickening at  the mid and distal ascending colon with mild distention of the proximal ascending colon and cecum CTs 10/17/2018--no acute findings, no chest lymphadenopathy, lungs clear Cycle 1 FOLFOX 12/28/2018 Cycle 2 FOLFOX 01/11/2019, Udenyca added  Cycle 3 FOLFOX 01/25/2019, Udenyca held due to bone pain Cycle 4 FOLFOX 02/21/2019, Udenyca added Cycle 5 FOLFOX 03/09/2019, Udenyca Cycle 6 FOLFOX 03/21/2019, Udenyca Cycle 7 FOLFOX 04/04/2019, Udenyca Cycle 8 FOLFOX 04/18/2019, Udenyca  Cycle 9 FOLFOX 05/02/2019, Udenyca Cycle 10 FOLFOX 05/16/2019, oxaliplatin, 5-FU bolus, and Udenyca held Cycle 11 FOLFOX 05/30/2019, oxaliplatin, 5-FU bolus, and Udenyca held Cycle 12 FOLFOX 06/15/2019, oxaliplatin, 5-FU bolus and Udenyca held CTs 09/29/2019-new hypodense enhancing liver masses consistent with metastatic disease, indistinct marginated nodularity below the pancreas head-likely small lymph nodes Biopsy liver lesion 10/18/2019-adenocarcinoma consistent with history of colorectal carcinoma Cycle 1 FOLFIRI/Avastin 11/01/2019 Cycle 2 FOLFIRI/Avastin 11/15/2019 Cycle 3 FOLFIRI/Avastin 11/29/2019 Cycle 4 FOLFIRI/Avastin 12/13/2019 CT chest 12/27/2019-no significant change in hepatic metastases, submassive pulmonary emboli Cycle 5 FOLFIRI 01/10/2020 (Avastin discontinued) Cycle 6 FOLFIRI 02/01/2020 Cycle 7 FOLFIRI 02/13/2020 Cycle 8 FOLFIRI 02/27/2020 CTs abdomen/pelvis 03/09/2020-per Dr. Gearldine Reeves review overall stable disease Cycle 9 FOLFIRI 04/16/2020 Cycle 10 FOLFIRI 05/01/2020 Cycle 11 FOLFIRI 05/15/2020 Cycle 12 FOLFIRI 05/29/2020 CT abdomen/pelvis 06/07/2020- decreased size of liver lesions, no new lesions Cycle 13 FOLFIRI 06/12/2020 Cycle 14 FOLFIRI 07/10/2020 Cycle 15 FOLFIRI 07/24/2020 Cycle 16 FOLFIRI 08/07/2020 Cycle 17 FOLFIRI 08/21/2020 Cycle 18 FOLFIRI 09/04/2020 CT abdomen/pelvis 09/14/2020-stable faint liver lesions, no new lesions, no new site of metastatic disease Cycle 19 09/18/2020  Treatment held 10/09/2020 due to upper  respiratory symptoms, known COVID exposure Cycle 20 FOLFIRI 10/30/2020 Cycle 21 FOLFIRI 11/20/2020 Cycle 22 FOLFIRI 12/12/2020 Cycle 23 FOLFIRI 01/01/2021, Emend added CTs 01/22/2021-faintly seen residual  of prior hepatic metastatic disease seen at 2 sites, no progression.  No new findings of metastatic disease. FOLFIRI placed on hold, referred for liver MRI MRI abdomen 01/29/2021-no enhancing hepatic lesions, 9 mm hyperintense T2 signal lesion near the dome of the right hepatic lobe and 7 mm hyperintense T2 signal lesion in the inferior right hepatic lobe segment 6, no other evidence of metastatic disease Case presented at GI tumor conference 02/27/2021-not a candidate for hepatic resection, observation recommended, repeat MRI at a 45-monthinterval   Deaf Right epididymal cyst removal 03/22/2018 Asthma Port-A-Cath placement, Alexander Reeves 12/23/2018 Neutropenia secondary to chemotherapy-Udenyca added for cycle 2 FOLFOX Admission with febrile neutropenia 02/08/2019 Hospital admission with submassive PE and left lower extremity DVT on 12/27/2019, heparin, transition to Xarelto 12/28/2019 CT chest 12/27/2019-submassive pulmonary emboli with evidence of right heart strain, no significant change in hepatic metastases      Disposition: Mr. JEhlyappears well.  He is currently on a treatment break.  We discussed the recommendation from the GI tumor conference for continued observation with a repeat MRI at a 377-monthnterval, not currently a candidate for hepatic therapy.  He agrees with this plan.  We referred him for a follow-up liver MRI in 6 to 7 weeks.  He mistakenly discontinued all of his medications following his last office visit.  We instructed him to resume Xarelto, Protonix, asthma medications.  He will return for lab, port flush and follow-up in approximately 7 weeks with the plan for a follow-up liver MRI a few days prior to that appointment.  Patient seen with Alexander Reeves   Alexander Reeves/GNP-BC   03/05/2021  9:44 AM  This was a shared visit with Alexander Reeves We discussed the GI conference discussion with Alexander Reeves He has not felt to be a candidate for surgical resection.  The conference radiologist felt there were most likely additional liver lesions.  Observation and a follow-up MRI is recommended.   I was present for greater than 50% of today's visit.  I performed medical decision making.  BrJulieanne MansonMD

## 2021-03-15 ENCOUNTER — Encounter (HOSPITAL_COMMUNITY): Payer: Self-pay

## 2021-03-26 ENCOUNTER — Encounter: Payer: Self-pay | Admitting: Oncology

## 2021-04-01 ENCOUNTER — Telehealth: Payer: Self-pay | Admitting: *Deleted

## 2021-04-01 NOTE — Telephone Encounter (Signed)
MRI scheduled for 04/23/21 at [facility] for 1130 arrival for 12:00 scan. [facility] only had evening appointments and [facility] does not run in the evening. NPO 4 hour prior. Check in at main entrance admitting.  ?[name] notified via voice mail w/assistance of sign language representative. Also mailed appointment information to his home. Scheduler notified to arrange for his transportation. ?

## 2021-04-09 ENCOUNTER — Ambulatory Visit: Payer: Medicare Other | Admitting: Podiatry

## 2021-04-15 ENCOUNTER — Other Ambulatory Visit (INDEPENDENT_AMBULATORY_CARE_PROVIDER_SITE_OTHER): Payer: Self-pay | Admitting: Primary Care

## 2021-04-15 MED ORDER — ALBUTEROL SULFATE HFA 108 (90 BASE) MCG/ACT IN AERS: 2.0000 | INHALATION_SPRAY | Freq: Four times a day (QID) | RESPIRATORY_TRACT | 2 refills | Status: DC | PRN

## 2021-04-19 ENCOUNTER — Ambulatory Visit (INDEPENDENT_AMBULATORY_CARE_PROVIDER_SITE_OTHER): Payer: Self-pay | Admitting: *Deleted

## 2021-04-19 NOTE — Telephone Encounter (Signed)
Sign language interpreter.  ? ?Chief Complaint: pain or indigestion in center of chest ?Symptoms: not relieved by antacids ?Frequency: off and on frequently ?Pertinent Negatives: Patient denies being on any meds but states has heart disease. ?Disposition: '[x]'$ ED /'[]'$ Urgent Care (no appt availability in office) / '[]'$ Appointment(In office/virtual)/ '[]'$  [facility]/ '[]'$ Home Care/ '[]'$ Refused Recommended Disposition /'[]'$ [facility]/ '[]'$  Follow-up with PCP ?Additional Notes: Pt was saying indigestion but also saying only in center of chest. Not related to abdomin or upper abdomin, just in the center of chest. Pt advised ED, states he has a friend to drive him.  ? ?Reason for Disposition ? [1] Chest pain (or "angina") comes and goes AND [2] is happening more often (increasing in frequency) or getting worse (increasing in severity) (Exception: chest pains that last only a few seconds) ? ?Answer Assessment - Initial Assessment Questions ?1. LOCATION: "Where does it hurt?"   ?    Center of chest ?2. RADIATION: "Does the pain go anywhere else?" (e.g., into neck, jaw, arms, back) ?    no ?3. ONSET: "When did the chest pain begin?" (Minutes, hours or days)  ?    3 weeks ?4. PATTERN "Does the pain come and go, or has it been constant since it started?"  "Does it get worse with exertion?"  ?    Comes and goes ?5. DURATION: "How long does it last" (e.g., seconds, minutes, hours) ?    Off an on ?6. SEVERITY: "How bad is the pain?"  (e.g., Scale 1-10; mild, moderate, or severe) ?   - MILD (1-3): doesn't interfere with normal activities  ?   - MODERATE (4-7): interferes with normal activities or awakens from sleep ?   - SEVERE (8-10): excruciating pain, unable to do any normal activities   ?    4 ?7. CARDIAC RISK FACTORS: "Do you have any history of heart problems or risk factors for heart disease?" (e.g., angina, prior heart attack; diabetes, high blood pressure, high cholesterol, smoker, or strong family history of  heart disease) ?    No, does have some type heart ?8. PULMONARY RISK FACTORS: "Do you have any history of lung disease?"  (e.g., blood clots in lung, asthma, emphysema, birth control pills) ?    unsure ?9. CAUSE: "What do you think is causing the chest pain?" ?    He thinks indigestion,  ?10. OTHER SYMPTOMS: "Do you have any other symptoms?" (e.g., dizziness, nausea, vomiting, sweating, fever, difficulty breathing, cough) ?      no ?11. PREGNANCY: "Is there any chance you are pregnant?" "When was your last menstrual period?" ?      na ? ?Protocols used: Chest Pain-A-AH ? ?

## 2021-04-20 ENCOUNTER — Emergency Department (HOSPITAL_COMMUNITY)
Admission: EM | Admit: 2021-04-20 | Discharge: 2021-04-20 | Disposition: A | Discharge status: Home / Self Care | Payer: Medicare Other | Attending: Emergency Medicine | Admitting: Emergency Medicine

## 2021-04-20 ENCOUNTER — Encounter (HOSPITAL_COMMUNITY): Payer: Self-pay | Admitting: Emergency Medicine

## 2021-04-20 ENCOUNTER — Other Ambulatory Visit: Payer: Self-pay

## 2021-04-20 ENCOUNTER — Emergency Department (HOSPITAL_COMMUNITY): Payer: Medicare Other

## 2021-04-20 DIAGNOSIS — R079 Chest pain, unspecified: Secondary | ICD-10-CM | POA: Diagnosis not present

## 2021-04-20 DIAGNOSIS — Z7901 Long term (current) use of anticoagulants: Secondary | ICD-10-CM | POA: Insufficient documentation

## 2021-04-20 DIAGNOSIS — J45909 Unspecified asthma, uncomplicated: Secondary | ICD-10-CM | POA: Diagnosis not present

## 2021-04-20 DIAGNOSIS — R0602 Shortness of breath: Secondary | ICD-10-CM | POA: Diagnosis not present

## 2021-04-20 DIAGNOSIS — Z85038 Personal history of other malignant neoplasm of large intestine: Secondary | ICD-10-CM | POA: Diagnosis not present

## 2021-04-20 DIAGNOSIS — R1013 Epigastric pain: Secondary | ICD-10-CM

## 2021-04-20 LAB — CBC
HCT: 46.4 % (ref 39.0–52.0)
Hemoglobin: 14.2 g/dL (ref 13.0–17.0)
MCH: 25.2 pg — ABNORMAL LOW (ref 26.0–34.0)
MCHC: 30.6 g/dL (ref 30.0–36.0)
MCV: 82.3 fL (ref 80.0–100.0)
Platelets: 200 10*3/uL (ref 150–400)
RBC: 5.64 MIL/uL (ref 4.22–5.81)
RDW: 15 % (ref 11.5–15.5)
WBC: 6.6 10*3/uL (ref 4.0–10.5)
nRBC: 0 % (ref 0.0–0.2)

## 2021-04-20 LAB — BASIC METABOLIC PANEL
Anion gap: 5 (ref 5–15)
BUN: 11 mg/dL (ref 6–20)
CO2: 28 mmol/L (ref 22–32)
Calcium: 9.4 mg/dL (ref 8.9–10.3)
Chloride: 106 mmol/L (ref 98–111)
Creatinine, Ser: 1.18 mg/dL (ref 0.61–1.24)
GFR, Estimated: >60 mL/min (ref >60)
Glucose, Bld: 96 mg/dL (ref 70–99)
Potassium: 4.3 mmol/L (ref 3.5–5.1)
Sodium: 139 mmol/L (ref 135–145)

## 2021-04-20 LAB — TROPONIN I (HIGH SENSITIVITY): Troponin I (High Sensitivity): 4 ng/L (ref <18)

## 2021-04-20 IMAGING — CT CT ANGIO CHEST
2 of 6 series · 18 of 36 positions shown · IV contrast (agent unspecified)
Comparison: Chest x-ray from earlier in the same day, CT from
[DATE] and [DATE]

CLINICAL DATA: Chest pain and shortness of breath, initial
encounter

EXAM:
CT ANGIOGRAPHY CHEST WITH CONTRAST
TECHNIQUE: Multidetector CT imaging of the chest was performed using the
standard protocol during bolus administration of intravenous
contrast. Multiplanar CT image reconstructions and MIPs were
obtained to evaluate the vascular anatomy.

[Series 7: pe thins · axial · 0.69mm/px · z∈[+1300,+1605]mm · 17 of 486 slices shown]
[im 25/486  lung]
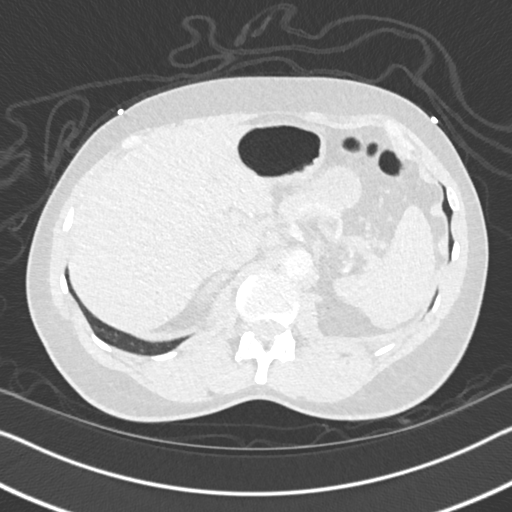
[im 49/486  mediastinal]
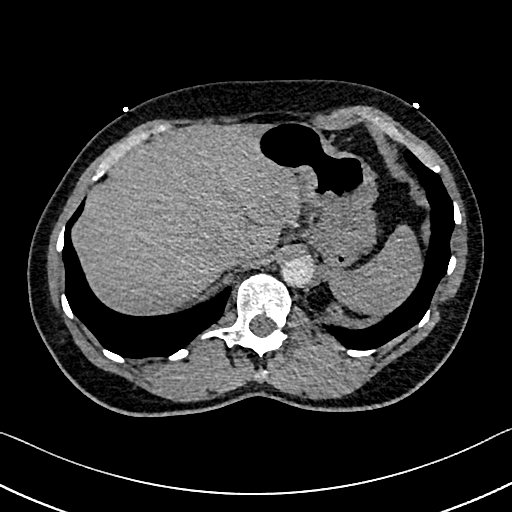
[im 73/486  lung]
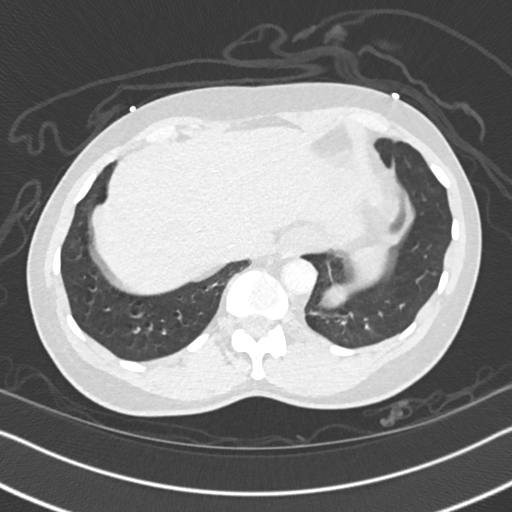
[im 98/486  mediastinal]
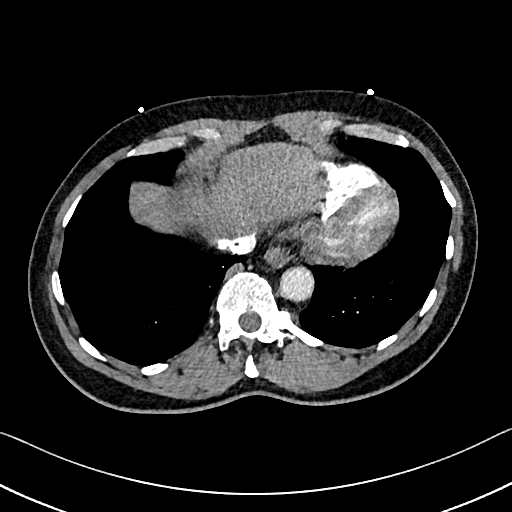
[im 146/486  lung]
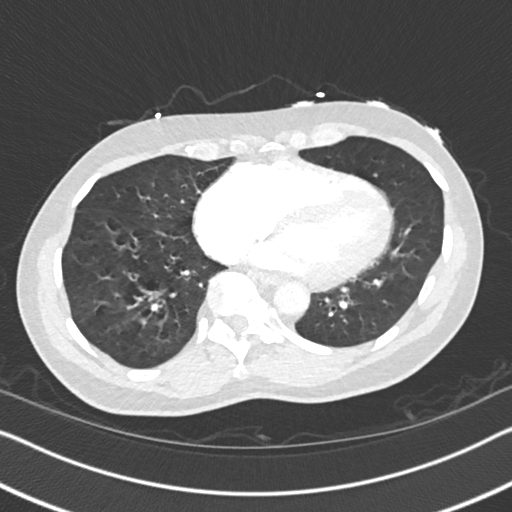
[im 170/486  mediastinal]
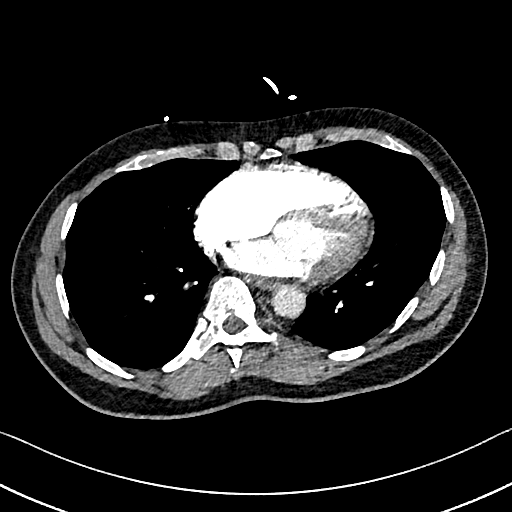
[im 195/486  lung]
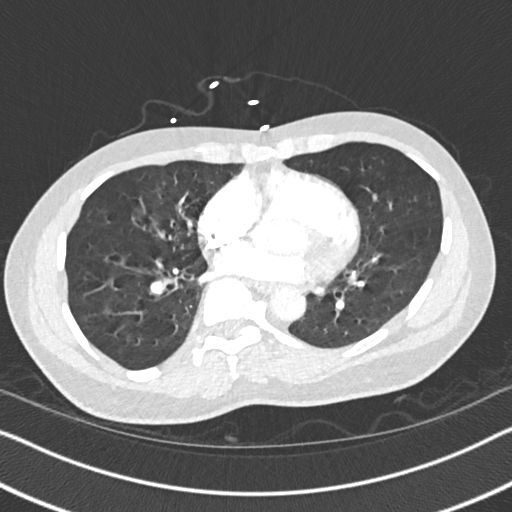
[im 219/486  mediastinal]
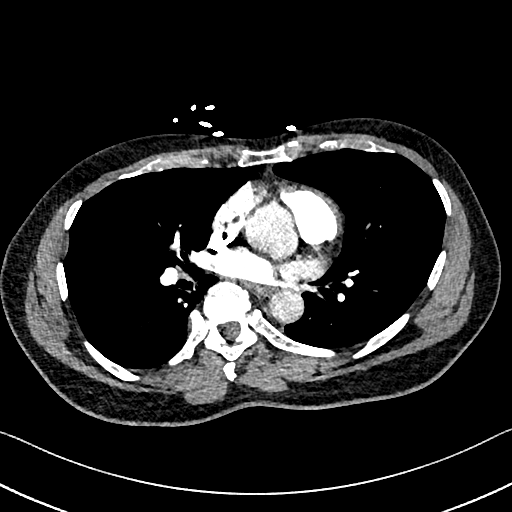
[im 243/486  lung]
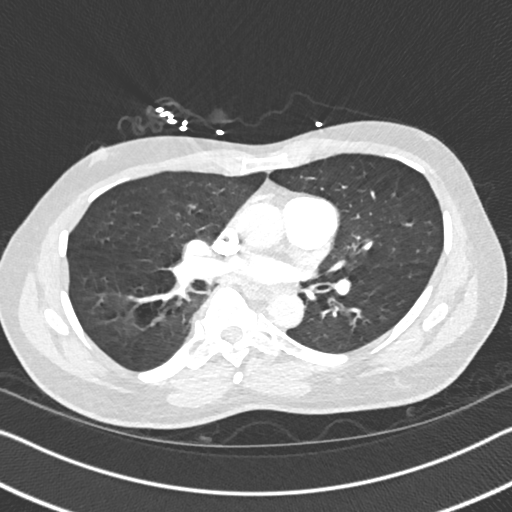
[im 267/486  mediastinal]
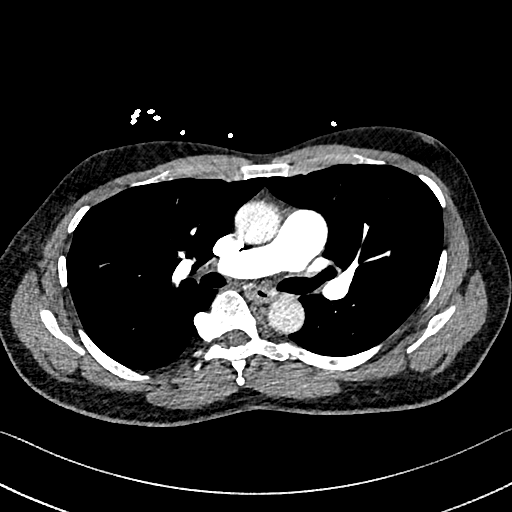
[im 292/486  lung]
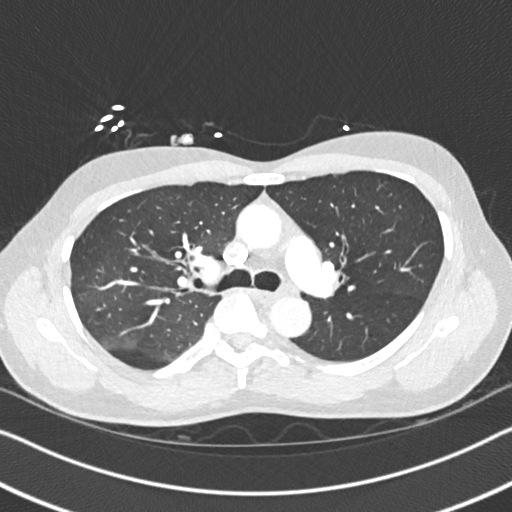
[im 316/486  mediastinal]
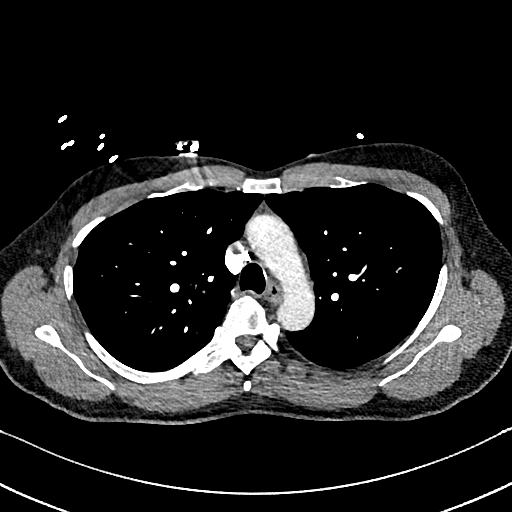
[im 340/486  lung]
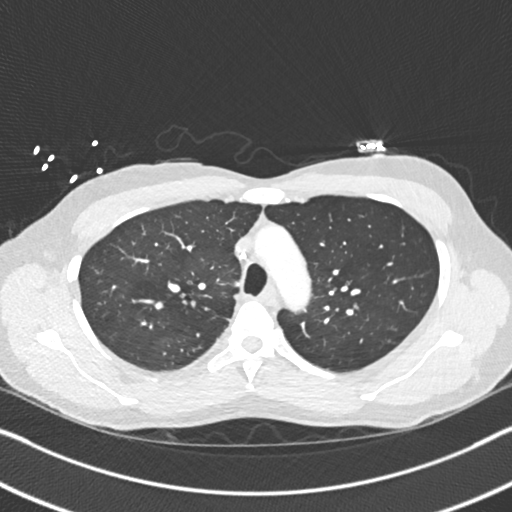
[im 389/486  mediastinal]
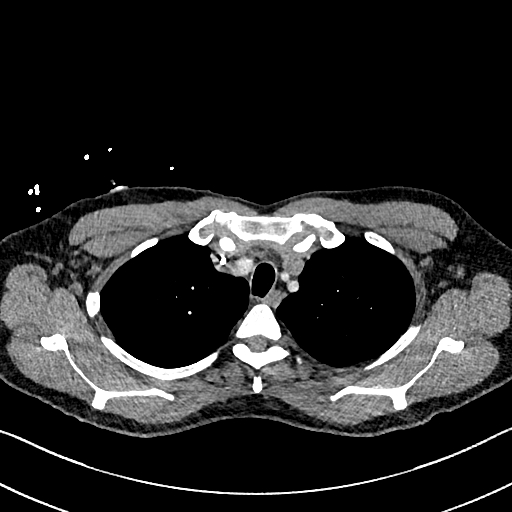
[im 413/486  lung]
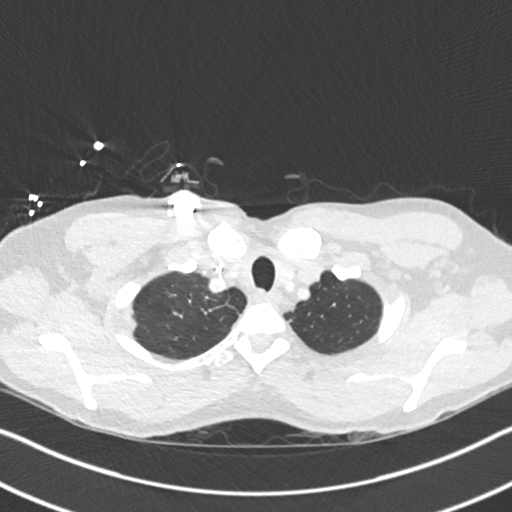
[im 437/486  mediastinal]
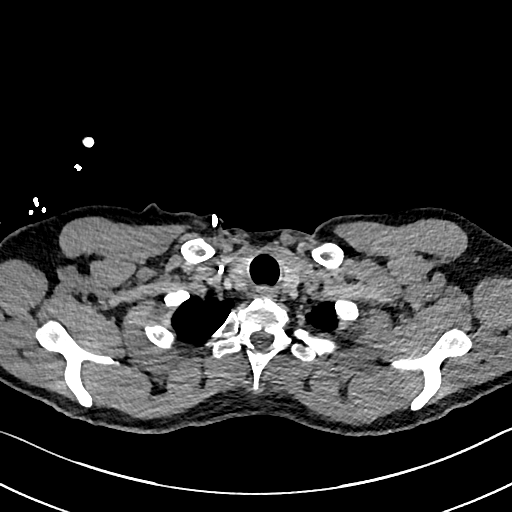
[im 461/486  lung]
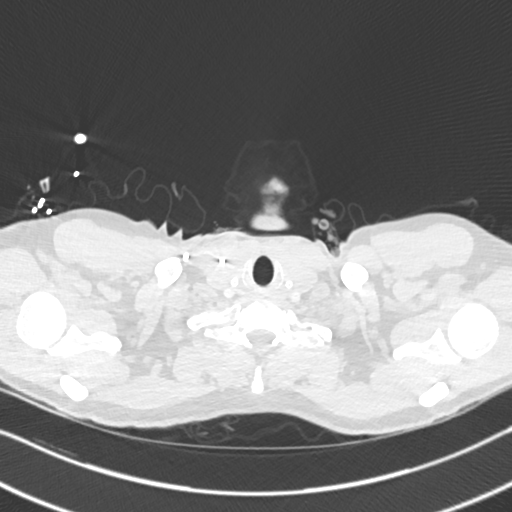

[Series 8: pe 2mm cor · coronal · 0.66mm/px · 1 of 149 slices shown]
[im 75/149  mediastinal]
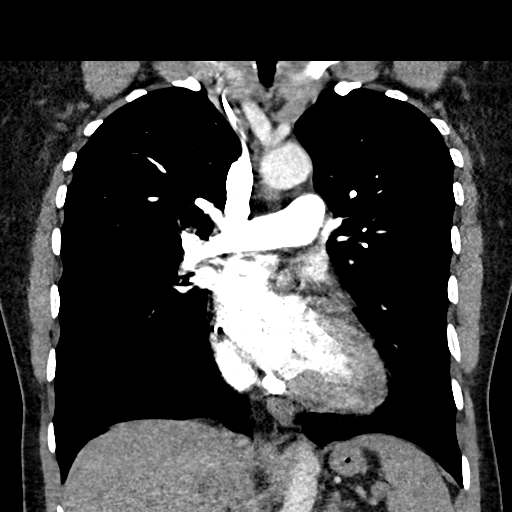

[18 of 36 positions shown; findings below may reference images not displayed]

RADIATION DOSE REDUCTION: This exam was performed according to the
departmental dose-optimization program which includes automated
exposure control, adjustment of the mA and/or kV according to
patient size and/or use of iterative reconstruction technique.

CONTRAST:  100mL OMNIPAQUE IOHEXOL 350 MG/ML SOLN
FINDINGS: Cardiovascular: Thoracic aorta demonstrates a normal branching
pattern. No aneurysmal dilatation or dissection is seen. Heart is at
the upper limits of normal in size. Pulmonary artery shows a normal
branching pattern bilaterally. No filling defect to suggest
pulmonary embolism is identified. Previously seen submassive
pulmonary emboli have resolved in the interval. No coronary
calcifications are noted. Right chest wall port is noted.

Mediastinum/Nodes: Thoracic inlet is within normal limits. No
sizable hilar or mediastinal adenopathy is noted. The esophagus as
visualized is within normal limits.

Lungs/Pleura: Lungs are well aerated bilaterally. No focal
infiltrate or sizable effusion is seen. No sizable parenchymal
nodules are noted.

Upper Abdomen: Liver demonstrates hypodense lesion within the right
lobe of the liver best seen on image number 170 of series 5
measuring 1.5 cm. Second similar appearing lesion measuring up to
1.8 cm is noted. Additionally vague decreased attenuation is noted
in the region of the caudate lobe suspicious for underlying mass
lesion. These are consistent with the known history of hepatic
metastatic disease. No other focal abnormality in the upper abdomen
is noted.

Musculoskeletal: No chest wall abnormality. No acute or significant
osseous findings.

Review of the MIP images confirms the above findings.
IMPRESSION: No evidence of pulmonary emboli. Previously seen pulmonary emboli
have resolved in the interval.

Hypodense lesions within the liver incompletely evaluated on this
exam consistent with metastatic disease given the known history and
previous CT findings.

No other focal abnormality is noted.

## 2021-04-20 MED ORDER — IOHEXOL 350 MG/ML SOLN
100.0000 mL | Freq: Once | INTRAVENOUS | Status: AC | PRN
  Administered 2021-04-20: 100 mL via INTRAVENOUS

## 2021-04-20 MED ORDER — LIDOCAINE VISCOUS HCL 2 % MT SOLN
15.0000 mL | Freq: Once | OROMUCOSAL | Status: AC
  Administered 2021-04-20: 15 mL via ORAL
  Filled 2021-04-20: qty 15

## 2021-04-20 MED ORDER — SODIUM CHLORIDE 0.9% FLUSH: 10.0000 mL | INTRAVENOUS | Status: DC | PRN

## 2021-04-20 MED ORDER — ALUM & MAG HYDROXIDE-SIMETH 200-200-20 MG/5ML PO SUSP
30.0000 mL | Freq: Once | ORAL | Status: AC
  Administered 2021-04-20: 30 mL via ORAL
  Filled 2021-04-20: qty 30

## 2021-04-20 MED ORDER — PANTOPRAZOLE SODIUM 40 MG PO TBEC: 40.0000 mg | DELAYED_RELEASE_TABLET | Freq: Every day | ORAL | 0 refills | Status: DC

## 2021-04-20 MED ORDER — CHLORHEXIDINE GLUCONATE CLOTH 2 % EX PADS: 6.0000 | MEDICATED_PAD | Freq: Every day | CUTANEOUS | Status: DC

## 2021-04-20 MED ORDER — SODIUM CHLORIDE 0.9% FLUSH: 10.0000 mL | Freq: Two times a day (BID) | INTRAVENOUS | Status: DC

## 2021-04-20 MED ORDER — ALBUTEROL SULFATE HFA 108 (90 BASE) MCG/ACT IN AERS: INHALATION_SPRAY | RESPIRATORY_TRACT | 0 refills | Status: DC

## 2021-04-20 MED ORDER — HEPARIN SOD (PORK) LOCK FLUSH 100 UNIT/ML IV SOLN
500.0000 [IU] | Freq: Once | INTRAVENOUS | Status: AC
  Administered 2021-04-20: 500 [IU] via INTRAVENOUS
  Filled 2021-04-20: qty 5

## 2021-04-20 MED ORDER — KETOROLAC TROMETHAMINE 30 MG/ML IJ SOLN
15.0000 mg | Freq: Once | INTRAMUSCULAR | Status: AC
  Administered 2021-04-20: 15 mg via INTRAVENOUS
  Filled 2021-04-20: qty 1

## 2021-04-20 NOTE — ED Provider Notes (Signed)
?Physical Exam  ?BP 109/82   Pulse 97   Temp 97.7 ?F (36.5 ?C) (Oral)   Resp 18   SpO2 97%  ? ?Physical Exam ?Vitals and nursing note reviewed.  ?Constitutional:   ?   General: He is not in acute distress. ?   Appearance: Normal appearance. He is well-developed. He is not ill-appearing or diaphoretic.  ?HENT:  ?   Head: Normocephalic and atraumatic.  ?   Nose: Nose normal.  ?   Mouth/Throat:  ?   Mouth: Mucous membranes are dry.  ?   Pharynx: Oropharynx is clear.  ?Eyes:  ?   Conjunctiva/sclera: Conjunctivae normal.  ?Cardiovascular:  ?   Rate and Rhythm: Normal rate and regular rhythm.  ?   Pulses: Normal pulses.     ?     Radial pulses are 2+ on the right side and 2+ on the left side.  ?     Dorsalis pedis pulses are 2+ on the right side and 2+ on the left side.  ?   Heart sounds: Normal heart sounds. No murmur heard. ?Pulmonary:  ?   Effort: Pulmonary effort is normal. No respiratory distress.  ?   Breath sounds: Normal breath sounds.  ?   Comments: CTAB, communicating without difficulty, breathing without increased effort or distress ?Chest:  ?   Chest wall: No mass, lacerations, swelling, tenderness, crepitus or edema.  ?   Comments: Port access palpable ?Abdominal:  ?   General: Bowel sounds are normal. There is no distension.  ?   Palpations: Abdomen is soft.  ?   Tenderness: There is no abdominal tenderness. There is no guarding.  ?Musculoskeletal:     ?   General: No swelling.  ?   Cervical back: Neck supple. No rigidity or tenderness.  ?   Right lower leg: No edema.  ?   Left lower leg: No edema.  ?Skin: ?   General: Skin is warm and dry.  ?   Capillary Refill: Capillary refill takes less than 2 seconds.  ?   Findings: No bruising or rash.  ?Neurological:  ?   General: No focal deficit present.  ?   Mental Status: He is alert and oriented to person, place, and time.  ?Psychiatric:     ?   Mood and Affect: Mood normal.  ? ? ?Procedures  ?Procedures ? ?ED Course / MDM  ?  ?Medical Decision  Making ?Amount and/or Complexity of Data Reviewed ?External Data Reviewed: labs and notes. ?Labs: ordered. Decision-making details documented in ED Course. ?Radiology: ordered and independent interpretation performed. Decision-making details documented in ED Course. ?ECG/medicine tests: ordered and independent interpretation performed. Decision-making details documented in ED Course. ? ?Risk ?OTC drugs. ?Prescription drug management. ? ? ?56 y.o. male presents to the ED for concern of Epigastric pain.  This involves an extensive number of treatment options, and is a complaint that carries with it a high risk of complications and morbidity.  The differential diagnosis includes GERD, acute coronary syndrome, gastritis, pneumonia, pneumothorax, pulmonary embolism ? ?In person ASL interpreter utilized for the duration of hospital encounter. ? ?Comorbidities that complicate the patient evaluation include metastatic colon cancer, chemotherapy, prior PE and DVT with chronic anticoagulation ? ?Additional history obtained from internal/external records available via epic ? ?Interpretation: ?I ordered, and personally interpreted labs.  The pertinent results include:   ?-Troponin: negative ?-BMP: unremarkable ?-CBC: unremarkable ? ?I ordered imaging studies including CXR and CTA chest .  I independently visualized and  interpreted imaging which showed no evidence of pulmonary embolism and no acute cardiopulmonary pathology.  I agree with the radiologist interpretation ? ?Intervention: ?I ordered medication including pantoprazole and toradol  for symptom management.  Reevaluation of the patient after these medicines showed that the patient showed moderate improvement and tolerated them well.  I have reviewed the patients home medicines and have made adjustments as needed ? ?The patient was maintained on a cardiac monitor.  I personally viewed and interpreted the cardiac monitored which showed an underlying rhythm of: normal  sinus rhythm.  I personally ordered and interpreted EKG which showed no recent changes in comparison with last EKG. ? ?Updated by nursing staff that pt's port from chemotherapy was inaccessible, and IV team was contacted to address this issue prior to CTA imaging. ? ?ED Course: ?I assumed care of this patient from Lavonna Rua PA-C.  Pt well-appearing on exam.  Mild subjective pleurisy type pain, worse with inspiration.  Chest pain not reproducible and does not radiate.  Localized to the epigastric region.  Worse with food.  Stopped chemotherapy 2-4 weeks ago for his stage 4 metastatic colon cancer with liver involvement.  Had previous GI surgery for this as well.  Usually takes NSAIDs for stomach pain 1-2 x per week, but has not taken them the past month since his prescription ran out.  Chief complaint of pain began three weeks ago.  Currently still taking Xarelto from previously diagnosed PE per Dr. Jefferson Fuel.  Denies melena or hematochezia.  No complaints of urinary symptoms.  He spoke with his family care doctor yesterday about his "heartburn" symptoms and was encouraged to come to the ED. ? ?Pt states symptoms similar to previous PE.  Hx of DVT in left leg 2 years ago.  No physical exam findings suggestive of DVT in lower extremities today.  O2 saturation at 97% on room air and no tachycardia appreciated.  Considered pneumothorax, pleural effusion, pulmonary edema, and pneumonia, but not appreciated on CXR.  Considered pulmonary embolism, but not appreciated on CTA and vitals not suggestive.  In addition, pt still on Xarelto.  Considered acute coronary syndrome, but history and physical exam not supportive.  No DOE and negative troponin as well.  I suspect the epigastric tenderness may be GI related.  Colleague Lavonna Rua PA-C provided pt with GI cocktail. Mild improvement appreciated upon re-evaluation.  Pt requested anti-inflammatory treatment since he ran out.  Since this medication orally may be contributing  to his symptoms, provided toradol IV single dose.  I ordered a single dose of pantoprazole in ED and provided a 2 week prescription to his pharmacy.  Pt also has chronic asthma and requested albuterol refill since running out.  I sent one refill to his pharmacy.  Recommended close follow-up with primary care upon discharge. ? ?Emergency department workup does not suggest an emergent condition requiring admission or immediate intervention beyond  what has been performed at this time.  The patient is safe for discharge and has been instructed to return immediately for worsening symptoms, change in symptoms or any other concerns ? ?Social Determinants of Health include Medicare ? ?Disposition: ?I discussed the patient and their case with my attending, Dr. Kathrynn Humble, who agreed with the proposed treatment course.  After consideration of the diagnostic results and the patient's response to treatment, I feel that the patient would benefit from close outpatient follow-up with PCP for re-evaluation and continued medical management with a course of Pantoprazole.  Discussed course of treatment thoroughly  with the patient and he demonstrated understanding.  Patient in agreement and has no further questions. ? ? ?This chart was dictated using voice recognition software.  Despite best efforts to proofread,  errors can occur which can change the documentation meaning. ? ? ? ?  ?Prince Rome, PA-C ?66/06/00 1233 ? ?  ?Varney Biles, MD ?04/24/21 0014 ? ?

## 2021-04-20 NOTE — ED Notes (Signed)
DC instructions reviewed with pt. PT verbalized understanding. Pt DC °

## 2021-04-20 NOTE — Discharge Instructions (Addendum)
Follow up with your primary care in the next 2-3 days for continued medical management as discussed ? ?A prescription by the name of Pantoprazole has been sent to your pharmacy.  You may take this once a day for the next two weeks, or until you follow up with your primary care ? ?Return to the ED for new or worsening symptoms as discussed ?

## 2021-04-20 NOTE — ED Provider Notes (Signed)
?[facility] ?Provider Note ? ? ?CSN: [id number] ?Arrival date & time: 04/20/21  1354 ? ?  ? ?History ? ?No chief complaint on file. ? ? ?Alexander PINA Sr. is a 56 y.o. male. ? ?Patient with history of metastatic colon cancer, DVT, PE, asthma presents today with complaints of chest pain and shortness of breath.  He states that same began approximately 3 weeks ago and has been progressively worsening since then.  He states that the pain is all sharp and located in his substernal region.  He states that it is pleuritic in nature, not reproducible to palpation, and does not radiate.  He states that the pain is normally present when he lays down to sleep at night but is also sometimes presenting without discernible trigger. States that it is sometimes triggered when he eats, but states that he has not had anything to eat today and he is currently in pain. Current pain 4/10. States that his shortness of breath started around the same time as the pain and that he has associated 'cramping pain' in the bases of his bilateral lungs.  According to chart review, it appears that he is supposed to be taking Xarelto with his history of PE and DVT, however when asked he is unsure if he is taking this or not.  Patient does have active metastatic colon cancer has not only on chemotherapy or radiation.  Patient states that he has felt pain exactly similar to this in the past, when asked if it was when he was found to have blood clots in his lungs, he states 'maybe, Im not sure.'  States that 2 weeks ago he felt the pain could be due to reflux and subsequently took Mylanta, he states that this helped some but also caused him to vomit.  He denies any subsequent episodes of vomiting.  He denies any fevers or chills, cough, or congestion.  Does endorse feeling intermittently diaphoretic but states that this is not new for him. Denies leg pain or leg swelling. ? ?The history is provided by the  patient. A language interpreter was used.  ? ?  ? ?Home Medications ?Prior to Admission medications   ?Medication Sig Start Date End Date Taking? Authorizing Provider  ?albuterol (VENTOLIN HFA) 108 (90 Base) MCG/ACT inhaler Inhale 2 puffs into the lungs every 6 (six) hours as needed for wheezing or shortness of breath. 04/15/21   [name], NP  ?ciclopirox (PENLAC) 8 % solution Apply topically at bedtime. Apply over nail and surrounding skin. Apply daily over previous coat. After seven (7) days, may remove with alcohol and continue cycle. 02/21/20   Criselda Peaches, DPM  ?fenofibrate (TRICOR) 145 MG tablet Take 1 tablet by mouth once daily 12/05/20   [name], NP  ?fluticasone furoate-vilanterol (BREO ELLIPTA) 200-25 MCG/INH AEPB Inhale 1 puff into the lungs daily. ?Patient taking differently: Inhale 1 puff into the lungs daily as needed (shortness of breath). 09/26/19   [name], NP  ?gabapentin (NEURONTIN) 300 MG capsule Take 1 capsule (300 mg total) by mouth 2 (two) times daily. 10/08/20   McDonald, Stephan Minister, DPM  ?guaiFENesin (ROBITUSSIN) 100 MG/5ML liquid Take 5 mLs by mouth every 4 (four) hours as needed for cough or to loosen phlegm. 01/01/21   Ladell Pier, MD  ?ibuprofen (ADVIL) 600 MG tablet Take 1 tablet (600 mg total) by mouth every 12 (twelve) hours as needed for moderate pain. ?Patient taking differently: Take 600  mg by mouth every 12 (twelve) hours as needed for moderate pain. Takes ibuprofen 03/01/20   Ladell Pier, MD  ?lidocaine-prilocaine (EMLA) cream Apply 1 application topically as needed. 01/01/21   Ladell Pier, MD  ?loratadine (CLARITIN) 10 MG tablet Take 10 mg by mouth daily as needed for allergies.    [provider]  ?methylPREDNISolone (MEDROL DOSEPAK) 4 MG TBPK tablet Take as directed ?Patient not taking: Reported on 02/12/2021 08/23/20   Owens Shark, NP  ?montelukast (SINGULAIR) 10 MG tablet Take 1 tablet (10 mg total) by mouth at bedtime.  07/10/20   Ladell Pier, MD  ?Naphazoline HCl (CLEAR EYES OP) Place 1 drop into both eyes daily as needed (redness/dryness).    [provider]  ?ondansetron (ZOFRAN) 8 MG tablet Take 1 tablet (8 mg total) by mouth every 8 (eight) hours as needed for nausea or vomiting. Start 72 hours after IV chemotherapy given ?Patient not taking: Reported on 02/12/2021 06/12/20   Ladell Pier, MD  ?pantoprazole (PROTONIX) 40 MG tablet Take 1 tablet (40 mg total) by mouth daily. 07/24/20   Owens Shark, NP  ?prochlorperazine (COMPAZINE) 10 MG tablet Take 1 tablet (10 mg total) by mouth every 6 (six) hours as needed for nausea. 09/04/20   Owens Shark, NP  ?rivaroxaban (XARELTO) 20 MG TABS tablet Take 1 tablet (20 mg total) by mouth daily with supper. 07/24/20   Owens Shark, NP  ?traMADol (ULTRAM) 50 MG tablet Take 1 tablet (50 mg total) by mouth every 6 (six) hours as needed. 6/73/41   Leighton Ruff, MD  ?   ? ?Allergies    ?Cheese, Eggs or egg-derived products, Penicillins, and Gemfibrozil   ? ?Review of Systems   ?Review of Systems  ?Constitutional:  Negative for chills and fever.  ?HENT:  Negative for congestion.   ?Respiratory:  Positive for shortness of breath. Negative for cough, wheezing and stridor.   ?Cardiovascular:  Positive for chest pain.  ?Gastrointestinal:  Negative for abdominal pain, diarrhea, nausea and vomiting.  ?Neurological:  Negative for dizziness, tremors, seizures, syncope, facial asymmetry, speech difficulty, weakness, light-headedness, numbness and headaches.  ?Psychiatric/Behavioral:  Negative for confusion and decreased concentration.   ?All other systems reviewed and are negative. ? ?Physical Exam ?Updated Vital Signs ?BP 109/82   Pulse 97   Temp 97.7 ?F (36.5 ?C) (Oral)   Resp 18   SpO2 97%  ?Physical Exam ?Vitals and nursing note reviewed.  ?Constitutional:   ?   General: He is not in acute distress. ?   Appearance: Normal appearance. He is normal weight. He is not  ill-appearing, toxic-appearing or diaphoretic.  ?HENT:  ?   Head: Normocephalic and atraumatic.  ?Eyes:  ?   Extraocular Movements: Extraocular movements intact.  ?   Pupils: Pupils are equal, round, and reactive to light.  ?Cardiovascular:  ?   Rate and Rhythm: Normal rate and regular rhythm.  ?   Pulses: Normal pulses.  ?   Heart sounds: Normal heart sounds.  ?Pulmonary:  ?   Effort: Pulmonary effort is normal. No respiratory distress.  ?   Breath sounds: Normal breath sounds. No stridor. No wheezing or rales.  ?Chest:  ?   Chest wall: No tenderness.  ?Abdominal:  ?   General: Abdomen is flat.  ?   Palpations: Abdomen is soft.  ?   Tenderness: There is no abdominal tenderness.  ?Musculoskeletal:     ?   General: No swelling or  tenderness. Normal range of motion.  ?   Cervical back: Normal range of motion.  ?   Right lower leg: No edema.  ?   Left lower leg: No edema.  ?Skin: ?   General: Skin is warm and dry.  ?Neurological:  ?   General: No focal deficit present.  ?   Mental Status: He is alert.  ?Psychiatric:     ?   Mood and Affect: Mood normal.     ?   Behavior: Behavior normal.  ? ? ?ED Results / Procedures / Treatments   ?Labs ?(all labs ordered are listed, but only abnormal results are displayed) ?Labs Reviewed  ?CBC - Abnormal; Notable for the following components:  ?    Result Value  ? MCH 25.2 (*)   ? All other components within normal limits  ?BASIC METABOLIC PANEL  ?TROPONIN I (HIGH SENSITIVITY)  ?TROPONIN I (HIGH SENSITIVITY)  ? ? ?EKG ?None ? ?Radiology ?No results found. ? ?Procedures ?Procedures  ? ? ?Medications Ordered in ED ?Medications - No data to display ? ?ED Course/ Medical Decision Making/ A&P ?  ?                        ?Medical Decision Making ?Amount and/or Complexity of Data Reviewed ?Labs: ordered. ?Radiology: ordered. ? ?Risk ?OTC drugs. ?Prescription drug management. ? ? ?This patient presents to the ED for concern of chest pain and shortness of breath, this involves an extensive  number of treatment options, and is a complaint that carries with it a high risk of complications and morbidity.  The differential diagnosis includes PE, ACS, chest metastasis, pneumonia, GERD ? ? ?Co morbidities that compli

## 2021-04-20 NOTE — ED Triage Notes (Addendum)
Pt reports heartburn x 3 weeks that is worse when lying down at night.  Pain not relieved with antacids.  Reports mild SOB.  Denies nausea and vomiting except for 1 episode of vomiting last week.  Pt is a cancer patient.  [employer] sign language interpreter used.   ? ?NS notified to request in person sign language interpreter for pt. ?

## 2021-04-20 NOTE — ED Notes (Signed)
Patient transported to X-ray 

## 2021-04-20 NOTE — ED Notes (Signed)
Patient transported to CT 

## 2021-04-23 ENCOUNTER — Ambulatory Visit (HOSPITAL_COMMUNITY): Payer: Medicare Other

## 2021-04-25 ENCOUNTER — Ambulatory Visit: Payer: Medicare Other | Admitting: Podiatry

## 2021-04-26 ENCOUNTER — Inpatient Hospital Stay: Payer: Medicare Other

## 2021-04-26 ENCOUNTER — Inpatient Hospital Stay: Payer: Medicare Other | Admitting: Oncology

## 2021-05-01 ENCOUNTER — Other Ambulatory Visit (HOSPITAL_COMMUNITY): Payer: Medicare Other

## 2021-05-03 ENCOUNTER — Inpatient Hospital Stay: Payer: Medicare Other

## 2021-05-03 ENCOUNTER — Inpatient Hospital Stay: Payer: Medicare Other | Admitting: Oncology

## 2021-05-05 ENCOUNTER — Ambulatory Visit (HOSPITAL_COMMUNITY)
Admission: RE | Admit: 2021-05-05 | Discharge: 2021-05-05 | Disposition: A | Discharge status: Home / Self Care | Payer: Medicare Other | Source: Ambulatory Visit | Attending: Nurse Practitioner | Admitting: Nurse Practitioner

## 2021-05-05 DIAGNOSIS — C182 Malignant neoplasm of ascending colon: Secondary | ICD-10-CM | POA: Diagnosis not present

## 2021-05-05 IMAGING — MR MR ABDOMEN WO/W CM
12 of 23 series · 22 of 48 positions shown · IV contrast (gadavist)
Comparison: MRI abdomen dated [DATE]

CLINICAL DATA: Colon cancer with liver metastases

EXAM:
MRI ABDOMEN WITHOUT AND WITH CONTRAST
TECHNIQUE: Multiplanar multisequence MR imaging of the abdomen was performed
both before and after the administration of intravenous contrast.
CONTRAST:  7mL GADAVIST GADOBUTROL 1 MMOL/ML IV SOLN

[Series 2: ax ssfse nav · axial · 6.0mm · 0.70mm/px · 1 of 42 slices shown]
[im 1/42]
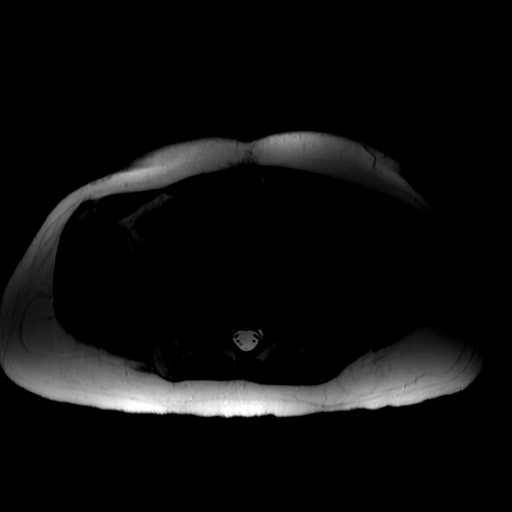

[Series 3: T2 fat-sat · axial · 6.0mm · 0.70mm/px · 1 of 42 slices shown]
[im 1/42]
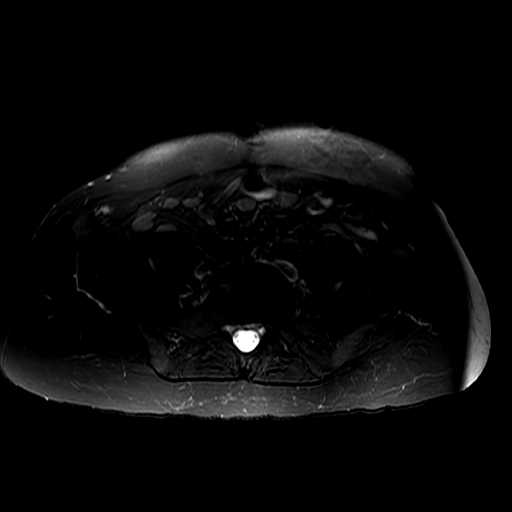

[Series 4: DWI b500 · axial · 8.0mm · 1.76mm/px · 1 of 54 slices shown]
[im 1/54]
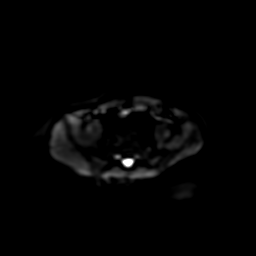

[Series 5: cor ssfse nav · coronal · 6.0mm · 0.78mm/px · 1 of 27 slices shown]
[im 1/27]
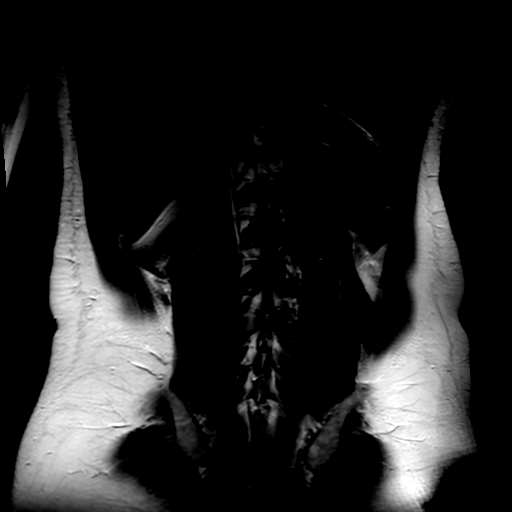

[Series 9: T1 dynamic · coronal · 3.4mm · 1.33mm/px · 1 of 116 slices shown (1 of 2)]
[im 1/116]
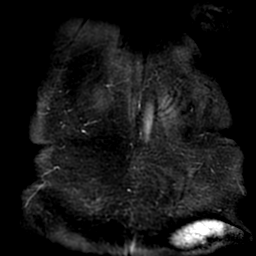

[Series 10: T1 dynamic · coronal · 3.4mm · 1.33mm/px · 2 of 116 slices shown (2 of 2)]
[im 1/116]
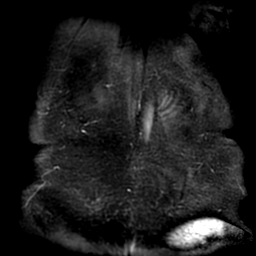
[im 116/116]
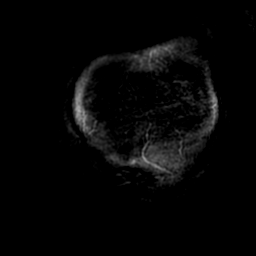

[Series 11: T1 dynamic post-contrast · axial · 4.0mm · 0.74mm/px · z∈[-163,+127]mm · 3 of 146 slices shown (1 of 5)]
[im 1/146]
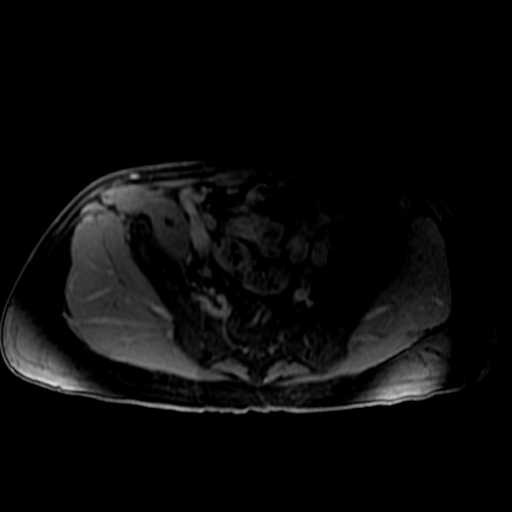
[im 73/146]
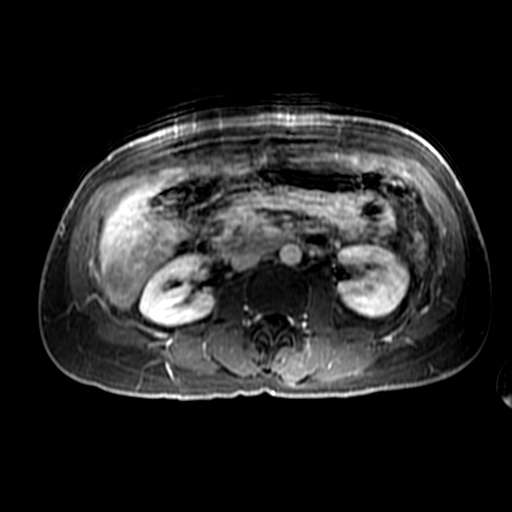
[im 146/146]
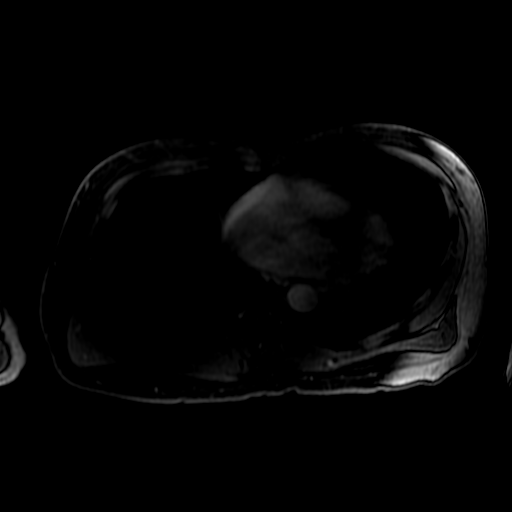

[Series 450: ADC · axial · 8.0mm · 1.76mm/px · 1 of 27 slices shown]
[im 1/27]
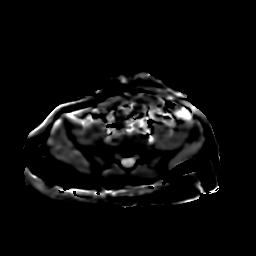

[Series 800: T1 dynamic post-contrast · axial · non-contrast · 4.0mm · 0.74mm/px · z∈[-163,+127]mm · 3 of 146 slices shown (2 of 5)]
[im 1/146]
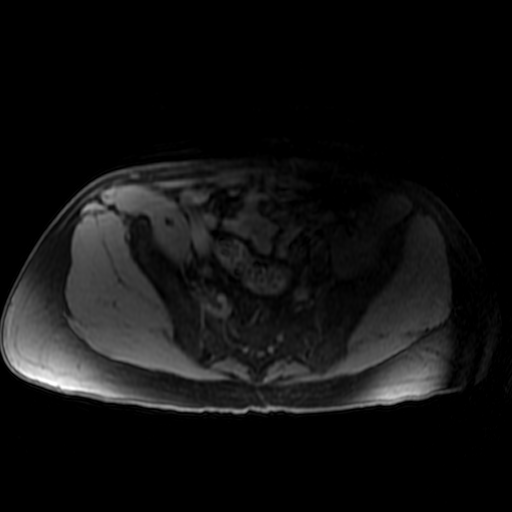
[im 73/146]
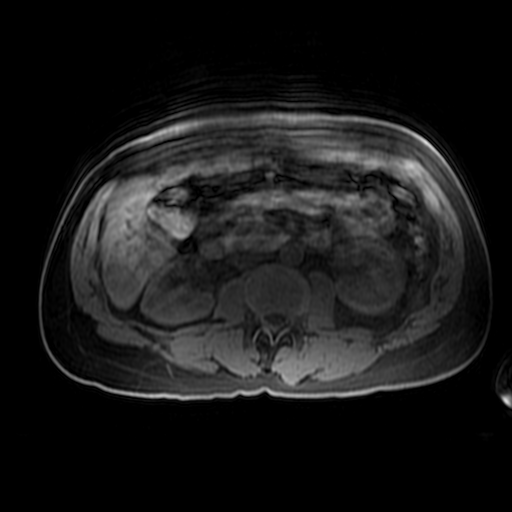
[im 146/146]
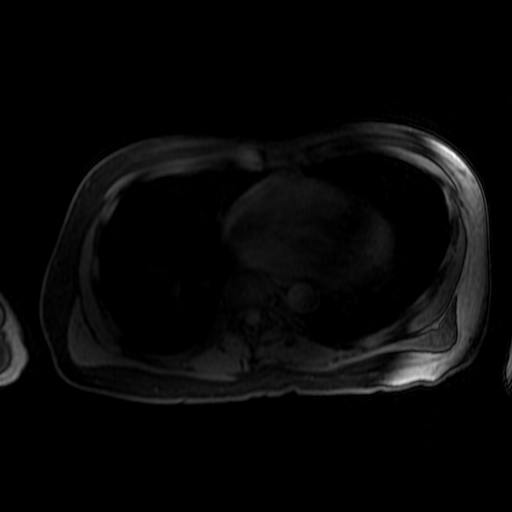

[Series 801: T1 dynamic post-contrast · axial · non-contrast · 4.0mm · 0.74mm/px · z∈[-163,+127]mm · 3 of 146 slices shown (3 of 5)]
[im 1/146]
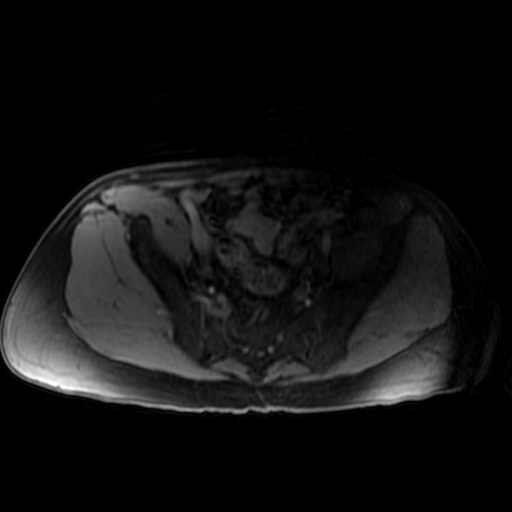
[im 73/146]
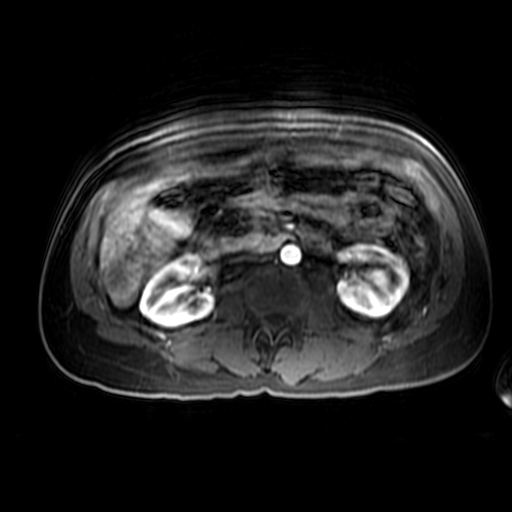
[im 146/146]
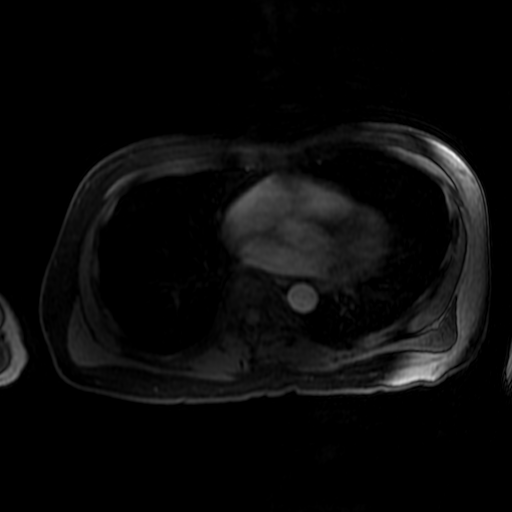

[Series 802: T1 dynamic post-contrast · axial · non-contrast · 4.0mm · 0.74mm/px · z∈[-163,+127]mm · 3 of 146 slices shown (4 of 5)]
[im 1/146]
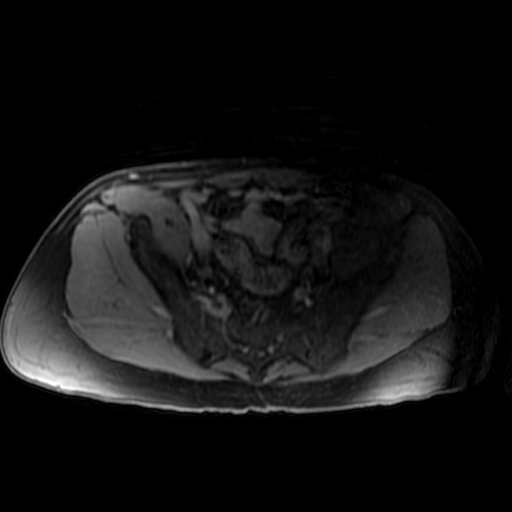
[im 73/146]
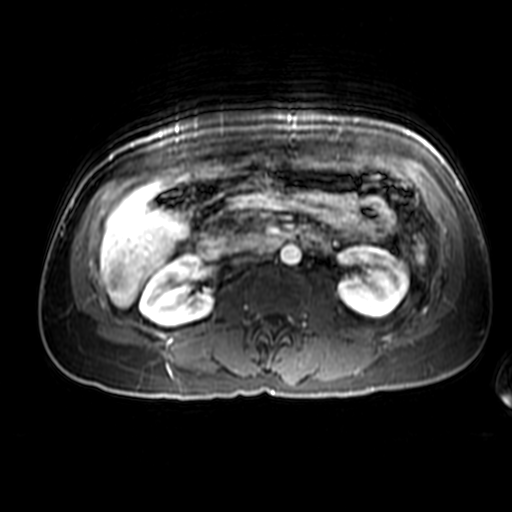
[im 146/146]
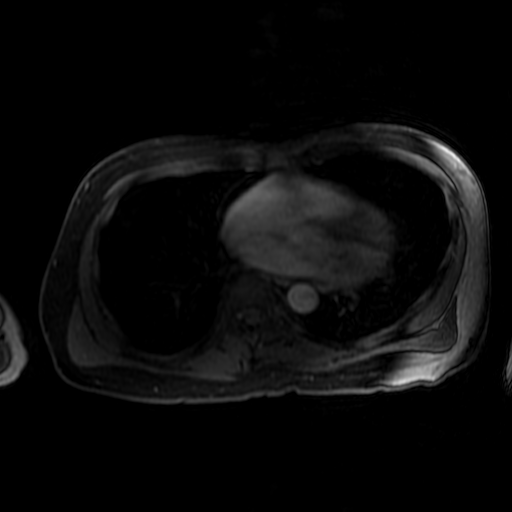

[Series 803: T1 dynamic post-contrast · axial · non-contrast · 4.0mm · 0.74mm/px · z∈[-163,-19]mm · 2 of 146 slices shown (5 of 5)]
[im 1/146]
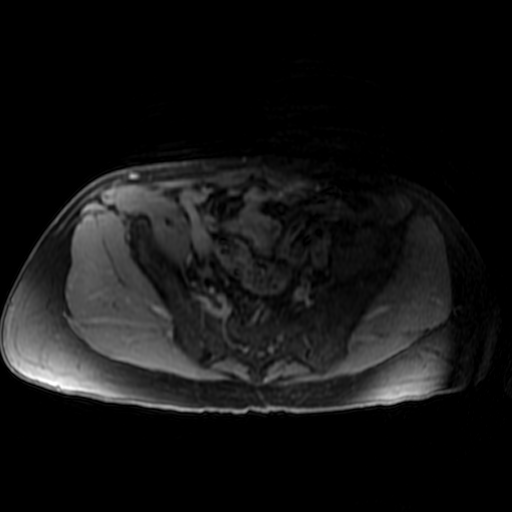
[im 73/146]
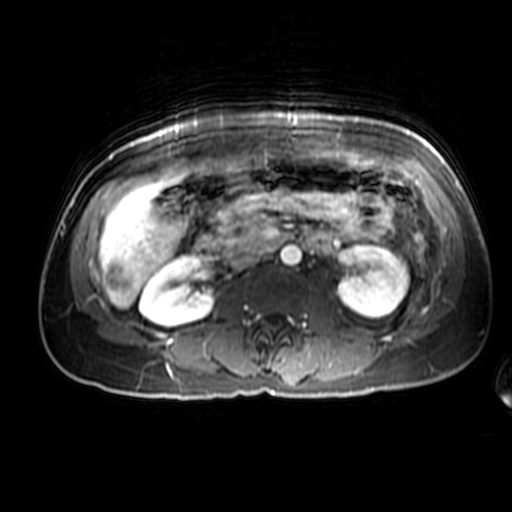

[22 of 48 positions shown; findings below may reference images not displayed]

FINDINGS: Motion degraded imaging, particularly the dynamic postcontrast
imaging, which is limited.

Lower chest: Lung bases are clear.

Hepatobiliary: Progressive multifocal hepatic metastases.
Previously, there were only two lesions measuring up to 10 mm. Now,
there are at least 8 lesions in both lobes, including the following
dominant index lesions:

--3.4 cm mass along the posterior aspect of segment 3 (series
3/image 12)

--4.5 cm mass medially in segment [DATE] (series 3/image 12)

--3.6 cm mass inferiorly in segment 6 (series 3/image 21)

Gallbladder is notable for layering sludge, without associated
inflammatory changes. No intrahepatic or extrahepatic duct
dilatation.

Pancreas:  Within normal limits.

Spleen:  Within normal limits.

Adrenals/Urinary Tract:  Adrenal glands are within normal limits.

Subcentimeter bilateral renal cysts.  No hydronephrosis.

Stomach/Bowel: Stomach is within normal limits.

Visualized bowel is unremarkable, noting colonic diverticulosis.

Vascular/Lymphatic:  No evidence of abdominal aortic aneurysm.

18 mm short axis portacaval node (series 3/image 16), new. 10 mm
short axis left para-aortic node (series 3/image 27), new.

Other:  No abdominal ascites.

Musculoskeletal: No focal osseous lesions.
IMPRESSION: Progressive hepatic metastases, measuring up to 4.5 cm, as above.

New upper abdominal/retroperitoneal nodal metastases, as above.

## 2021-05-05 MED ORDER — GADOBUTROL 1 MMOL/ML IV SOLN
7.0000 mL | Freq: Once | INTRAVENOUS | Status: AC | PRN
  Administered 2021-05-05: 7 mL via INTRAVENOUS

## 2021-05-06 ENCOUNTER — Inpatient Hospital Stay (HOSPITAL_BASED_OUTPATIENT_CLINIC_OR_DEPARTMENT_OTHER): Payer: Medicare Other | Admitting: Nurse Practitioner

## 2021-05-06 ENCOUNTER — Telehealth: Payer: Self-pay

## 2021-05-06 ENCOUNTER — Inpatient Hospital Stay: Payer: Medicare Other | Attending: Nurse Practitioner

## 2021-05-06 ENCOUNTER — Inpatient Hospital Stay: Payer: Medicare Other

## 2021-05-06 ENCOUNTER — Encounter: Payer: Self-pay | Admitting: *Deleted

## 2021-05-06 VITALS — BP 143/91 | HR 100 | Temp 97.8°F | Resp 18 | Ht 70.0 in | Wt 151.8 lb

## 2021-05-06 DIAGNOSIS — C182 Malignant neoplasm of ascending colon: Secondary | ICD-10-CM | POA: Diagnosis present

## 2021-05-06 DIAGNOSIS — Z86711 Personal history of pulmonary embolism: Secondary | ICD-10-CM | POA: Insufficient documentation

## 2021-05-06 DIAGNOSIS — C787 Secondary malignant neoplasm of liver and intrahepatic bile duct: Secondary | ICD-10-CM | POA: Diagnosis not present

## 2021-05-06 DIAGNOSIS — Z86718 Personal history of other venous thrombosis and embolism: Secondary | ICD-10-CM | POA: Insufficient documentation

## 2021-05-06 DIAGNOSIS — Z95828 Presence of other vascular implants and grafts: Secondary | ICD-10-CM

## 2021-05-06 DIAGNOSIS — C772 Secondary and unspecified malignant neoplasm of intra-abdominal lymph nodes: Secondary | ICD-10-CM | POA: Diagnosis not present

## 2021-05-06 LAB — CMP (CANCER CENTER ONLY)
ALT: 17 U/L (ref 0–44)
AST: 23 U/L (ref 15–41)
Albumin: 4.2 g/dL (ref 3.5–5.0)
Alkaline Phosphatase: 84 U/L (ref 38–126)
Anion gap: 8 (ref 5–15)
BUN: 12 mg/dL (ref 6–20)
CO2: 25 mmol/L (ref 22–32)
Calcium: 9.7 mg/dL (ref 8.9–10.3)
Chloride: 107 mmol/L (ref 98–111)
Creatinine: 1.05 mg/dL (ref 0.61–1.24)
GFR, Estimated: >60 mL/min (ref >60)
Glucose, Bld: 112 mg/dL — ABNORMAL HIGH (ref 70–99)
Potassium: 4.3 mmol/L (ref 3.5–5.1)
Sodium: 140 mmol/L (ref 135–145)
Total Bilirubin: 0.4 mg/dL (ref 0.3–1.2)
Total Protein: 7.4 g/dL (ref 6.5–8.1)

## 2021-05-06 LAB — CBC WITH DIFFERENTIAL (CANCER CENTER ONLY)
Abs Immature Granulocytes: 0.01 10*3/uL (ref 0.00–0.07)
Basophils Absolute: 0.1 10*3/uL (ref 0.0–0.1)
Basophils Relative: 1 %
Eosinophils Absolute: 0.5 10*3/uL (ref 0.0–0.5)
Eosinophils Relative: 8 %
HCT: 44 % (ref 39.0–52.0)
Hemoglobin: 13.4 g/dL (ref 13.0–17.0)
Immature Granulocytes: 0 %
Lymphocytes Relative: 24 %
Lymphs Abs: 1.6 10*3/uL (ref 0.7–4.0)
MCH: 24.8 pg — ABNORMAL LOW (ref 26.0–34.0)
MCHC: 30.5 g/dL (ref 30.0–36.0)
MCV: 81.3 fL (ref 80.0–100.0)
Monocytes Absolute: 0.5 10*3/uL (ref 0.1–1.0)
Monocytes Relative: 7 %
Neutro Abs: 4.1 10*3/uL (ref 1.7–7.7)
Neutrophils Relative %: 60 %
Platelet Count: 168 10*3/uL (ref 150–400)
RBC: 5.41 MIL/uL (ref 4.22–5.81)
RDW: 15.2 % (ref 11.5–15.5)
WBC Count: 6.7 10*3/uL (ref 4.0–10.5)
nRBC: 0 % (ref 0.0–0.2)

## 2021-05-06 MED ORDER — SODIUM CHLORIDE 0.9% FLUSH
10.0000 mL | INTRAVENOUS | Status: DC | PRN
  Administered 2021-05-06: 10 mL

## 2021-05-06 MED ORDER — HEPARIN SOD (PORK) LOCK FLUSH 100 UNIT/ML IV SOLN
500.0000 [IU] | Freq: Once | INTRAVENOUS | Status: AC | PRN
  Administered 2021-05-06: 500 [IU]

## 2021-05-06 NOTE — Progress Notes (Signed)
Alexander Reeves from [facility] interpreter at chairside for flush ?

## 2021-05-06 NOTE — Telephone Encounter (Signed)
TC from Pt inquiring about left fanny pack. Left message with interpretation services that we did not find a fanny pack ,but if we find it we will let him know. ?

## 2021-05-06 NOTE — Progress Notes (Signed)
Provided patient a box of Ensure today. ?

## 2021-05-06 NOTE — Progress Notes (Signed)
?Alexander Reeves ?OFFICE PROGRESS NOTE ? ? ?Diagnosis: Colon cancer ? ?INTERVAL HISTORY:  ? ?Alexander Reeves returns as scheduled.  He is accompanied by a sign language interpreter.  In general he feels well.  Main complaint is heartburn.  He discontinued Protonix after vomiting the last 2 times he took it.  He would like what they gave him in the emergency department. ? ?Objective: ? ?Vital signs in last 24 hours: ? ?Blood pressure (!) 143/91, pulse 100, temperature 97.8 ?F (36.6 ?C), temperature source Oral, resp. rate 18, height '5\' 10"'  (1.778 m), weight 151 lb 12.8 oz (68.9 kg), SpO2 98 %. ?  ? ?No physical exam performed ? ?Lab Results: ? ?Lab Results  ?Component Value Date  ? WBC 6.7 05/06/2021  ? HGB 13.4 05/06/2021  ? HCT 44.0 05/06/2021  ? MCV 81.3 05/06/2021  ? PLT 168 05/06/2021  ? NEUTROABS 4.1 05/06/2021  ? ? ?Imaging: ? ?MR LIVER W WO CONTRAST ? ?Result Date: 05/05/2021 ?CLINICAL DATA:  Colon cancer with liver metastases EXAM: MRI ABDOMEN WITHOUT AND WITH CONTRAST TECHNIQUE: Multiplanar multisequence MR imaging of the abdomen was performed both before and after the administration of intravenous contrast. CONTRAST:  53m GADAVIST GADOBUTROL 1 MMOL/ML IV SOLN COMPARISON:  MRI abdomen dated 01/29/2021 FINDINGS: Motion degraded imaging, particularly the dynamic postcontrast imaging, which is limited. Lower chest: Lung bases are clear. Hepatobiliary: Progressive multifocal hepatic metastases. Previously, there were only two lesions measuring up to 10 mm. Now, there are at least 8 lesions in both lobes, including the following dominant index lesions: --3.4 cm mass along the posterior aspect of segment 3 (series 3/image 12) --4.5 cm mass medially in segment 5/6 (series 3/image 12) --3.6 cm mass inferiorly in segment 6 (series 3/image 21) Gallbladder is notable for layering sludge, without associated inflammatory changes. No intrahepatic or extrahepatic duct dilatation. Pancreas:  Within normal limits.  Spleen:  Within normal limits. Adrenals/Urinary Tract:  Adrenal glands are within normal limits. Subcentimeter bilateral renal cysts.  No hydronephrosis. Stomach/Bowel: Stomach is within normal limits. Visualized bowel is unremarkable, noting colonic diverticulosis. Vascular/Lymphatic:  No evidence of abdominal aortic aneurysm. 18 mm short axis portacaval node (series 3/image 16), new. 10 mm short axis left para-aortic node (series 3/image 27), new. Other:  No abdominal ascites. Musculoskeletal: No focal osseous lesions. IMPRESSION: Progressive hepatic metastases, measuring up to 4.5 cm, as above. New upper abdominal/retroperitoneal nodal metastases, as above. Electronically Signed   By: SJulian HyM.D.   On: 05/05/2021 22:34   ? ?Medications: I have reviewed the patient's current medications. ? ?Assessment/Plan: ?Moderately differentiated adenocarcinoma ascending colon, stage IIIb (pT3pN1), status post a right colectomy 11/19/2018 ?Lymphovascular and perineural invasion present, 2/14 lymph nodes positive, tumor deposits present ?Positive radial margin, no loss of mismatch repair protein expression, discussed case with Dr. WCraig Staggersmass with surrounding inflammation at multiple margins, no gross residual disease, unclear which is the "positive "radial margin, further surgery and radiation not recommended ?Colonoscopy 11/19/2018-completely obstructing mid ascending colon mass, could not be passed with endoscope, biopsy confirmed invasive adenocarcinoma ?CT abdomen/pelvis 11/18/2018-wall thickening at the mid and distal ascending colon with mild distention of the proximal ascending colon and cecum ?CTs 10/17/2018--no acute findings, no chest lymphadenopathy, lungs clear ?Cycle 1 FOLFOX 12/28/2018 ?Cycle 2 FOLFOX 01/11/2019, Udenyca added  ?Cycle 3 FOLFOX 01/25/2019, Udenyca held due to bone pain ?Cycle 4 FOLFOX 02/21/2019, Udenyca added ?Cycle 5 FOLFOX 03/09/2019, Udenyca ?Cycle 6 FOLFOX 03/21/2019,  Udenyca ?Cycle 7 FOLFOX 04/04/2019, Udenyca ?Cycle 8 FOLFOX  04/18/2019, Ellen Henri  ?Cycle 9 FOLFOX 05/02/2019, Udenyca ?Cycle 10 FOLFOX 05/16/2019, oxaliplatin, 5-FU bolus, and Udenyca held ?Cycle 11 FOLFOX 05/30/2019, oxaliplatin, 5-FU bolus, and Udenyca held ?Cycle 12 FOLFOX 06/15/2019, oxaliplatin, 5-FU bolus and Udenyca held ?CTs 09/29/2019-new hypodense enhancing liver masses consistent with metastatic disease, indistinct marginated nodularity below the pancreas head-likely small lymph nodes ?Biopsy liver lesion 10/18/2019-adenocarcinoma consistent with history of colorectal carcinoma ?Cycle 1 FOLFIRI/Avastin 11/01/2019 ?Cycle 2 FOLFIRI/Avastin 11/15/2019 ?Cycle 3 FOLFIRI/Avastin 11/29/2019 ?Cycle 4 FOLFIRI/Avastin 12/13/2019 ?CT chest 12/27/2019-no significant change in hepatic metastases, submassive pulmonary emboli ?Cycle 5 FOLFIRI 01/10/2020 (Avastin discontinued) ?Cycle 6 FOLFIRI 02/01/2020 ?Cycle 7 FOLFIRI 02/13/2020 ?Cycle 8 FOLFIRI 02/27/2020 ?CTs abdomen/pelvis 03/09/2020-per Dr. Gearldine Shown review overall stable disease ?Cycle 9 FOLFIRI 04/16/2020 ?Cycle 10 FOLFIRI 05/01/2020 ?Cycle 11 FOLFIRI 05/15/2020 ?Cycle 12 FOLFIRI 05/29/2020 ?CT abdomen/pelvis 06/07/2020- decreased size of liver lesions, no new lesions ?Cycle 13 FOLFIRI 06/12/2020 ?Cycle 14 FOLFIRI 07/10/2020 ?Cycle 15 FOLFIRI 07/24/2020 ?Cycle 16 FOLFIRI 08/07/2020 ?Cycle 17 FOLFIRI 08/21/2020 ?Cycle 18 FOLFIRI 09/04/2020 ?CT abdomen/pelvis 09/14/2020-stable faint liver lesions, no new lesions, no new site of metastatic disease ?Cycle 19 09/18/2020  ?Treatment held 10/09/2020 due to upper respiratory symptoms, known COVID exposure ?Cycle 20 FOLFIRI 10/30/2020 ?Cycle 21 FOLFIRI 11/20/2020 ?Cycle 22 FOLFIRI 12/12/2020 ?Cycle 23 FOLFIRI 01/01/2021, Emend added ?CTs 01/22/2021-faintly seen residual of prior hepatic metastatic disease seen at 2 sites, no progression.  No new findings of metastatic disease. ?FOLFIRI placed on hold, referred for liver MRI ?MRI abdomen 01/29/2021-no  enhancing hepatic lesions, 9 mm hyperintense T2 signal lesion near the dome of the right hepatic lobe and 7 mm hyperintense T2 signal lesion in the inferior right hepatic lobe segment 6, no other evidence of metastatic disease ?Case presented at GI tumor conference 02/27/2021-not a candidate for hepatic resection, observation recommended, repeat MRI at a 78-monthinterval ?05/05/2021 MRI liver-progressive multifocal hepatic metastases.  Previously there were only 2 lesions measuring up to 10 mm.  On current study there are at least 8 lesions in both lobes.  New 18 mm portacaval node.  New left para-aortic node. ?  ?Deaf ?Right epididymal cyst removal 03/22/2018 ?Asthma ?Port-A-Cath placement, Dr. WDonne Hazel 12/23/2018 ?Neutropenia secondary to chemotherapy-Udenyca added for cycle 2 FOLFOX ?Admission with febrile neutropenia 02/08/2019 ?Hospital admission with submassive PE and left lower extremity DVT on 12/27/2019, heparin, transition to Xarelto 12/28/2019 ?CT chest 12/27/2019-submassive pulmonary emboli with evidence of right heart strain, no significant change in hepatic metastases  ?  ? ?Disposition: Mr. JHudlowappears stable.  He has been on a treatment break since late December 2022/early January 2023.  Recent MRI of the liver shows disease progression in the liver and two newly enlarged upper abdominal/retroperitoneal lymph nodes.  Results/images reviewed with Mr. JChittickat today's visit.  We again discussed that no therapy will be curative.  Dr. SGearldine Shownrecommendation is to resume FOLFIRI chemotherapy.  Mr. JMineauagrees with this plan.  He will return to begin FOLFIRI next week.  We will see him in follow-up in approximate 3 weeks. ? ?Patient seen with Dr. SBenay Spice ? ?Sign language interpreter present throughout today's visit. ? ? ? ?LNed CardANP/GNP-BC  ? ?05/06/2021  ?9:07 AM ? ?This was a shared visit with LNed Card  Mr. JCreggwas interviewed and examined.  We reviewed the MRI findings and images  with Mr. JGiraud  A sign language interpreter was present.  He understands there is radiologic evidence of disease progression while off of systemic therapy.  I recommend resuming treatment with FOLFIRI.  He  agrees.  We

## 2021-05-06 NOTE — Telephone Encounter (Signed)
Alexander Reeves [phone] is the patient case worker. ?

## 2021-05-06 NOTE — Telephone Encounter (Signed)
I secure chat Social worker clinical social worker Per Elby Showers. Marcello Moores to reach out to the patient and the patient case worker with housing. I place a referral in for the social worker. ?

## 2021-05-07 ENCOUNTER — Telehealth: Payer: Self-pay | Admitting: Nurse Practitioner

## 2021-05-07 ENCOUNTER — Telehealth: Payer: Self-pay

## 2021-05-07 ENCOUNTER — Encounter: Payer: Self-pay | Admitting: Licensed Clinical Social Worker

## 2021-05-07 NOTE — Progress Notes (Signed)
[facility] Clinical Social Work  ?Initial Assessment ? ? ?VICK FILTER Sr. is a 56 y.o. year old male contacted by phone. Clinical Social Work was referred by medical provider for assessment of psychosocial needs.  ? ?SDOH (Social Determinants of Health) assessments performed: Yes ?SDOH Interventions   ? ?Flowsheet Row Most Recent Value  ?SDOH Interventions   ?Food Insecurity Interventions Intervention Not Indicated  ?Financial Strain Interventions Intervention Not Indicated, [id number] Referral, Financial Counselor  ?Housing Interventions [id number] Referral, Other (Comment)  [Case Worker Jerolyn Shin [phone]  ?Intimate Partner Violence Interventions Intervention Not Indicated  ?Physical Activity Interventions Intervention Not Indicated  ?Stress Interventions Intervention Not Indicated, Offered [employer]  ?Social Connections Interventions Intervention Not Indicated  ?Transportation Interventions Intervention Not Indicated  ? ?  ?  ?Distress Screen completed: No ?   ? View : No data to display.  ?  ?  ?  ? ? ? ? ?Family/Social Information:  ?Housing Arrangement: patient lives with daughter and her partner . ?Family members/support persons in your life? Family and in [place], patient told his case manager he does not receive financial assistance from his daughter or her partner. ?Transportation concerns: no  ?Employment: Disabled. Income source: Theatre stage manager Income ?Financial concerns:  due to limited income and disability ?Type of concern: Utilities, Rent/ mortgage, and patient lived with significant other and she has passed, landlord has requested patient vacate the premises, "in a few months"  . Patient has limited SSI income of 700+ monthly, this is the patient's pn y source of income. ?Food access concerns: no ?Religious or spiritual practice: no ?Services Currently in place:  SSI, and [employer] Medicare ? ?Coping/ Adjustment to diagnosis: ?Patient understands treatment plan and what  happens next? yes ?Concerns about diagnosis and/or treatment: Overwhelmed by information, How I will pay for the services I need, and concerns about housing ?Patient reported stressors: Housing, Finances, and Adjusting to my illness ?Hopes and priorities: To  find housing ?Patient enjoys  N/A ?Current coping skills/ strengths: Average or above average intelligence , Capable of independent living , and Communication skills  ? ? ? SUMMARY: ?Current SDOH Barriers:  ?Financial constraints related to fixed SSI income, Limited social support, Housing barriers, Level of care concerns, Family and relationship dysfunction, Social Isolation, Literacy concerns, and patient is deaf and has limited ability to read [place]. ? ?Clinical Social Work Clinical Goal(s):  ?patient will work with case worker Jerolyn Shin [phone] to address needs related to housing ? ?Interventions: ?Discussed common feeling and emotions when being diagnosed with cancer, and the importance of support during treatment ?Informed patient of the support team roles and support services at [facility] ?Provided CSW contact information and encouraged patient to call with any questions or concerns ?Referred patient to [facility], and [facility] Provided patient with information about role of CSW in patient care, and available resources.  CSW and case worker spoke via interpreter services for the deaf and hard of hearing.  CSW and case worker discussed different options to assist patient in obtaining new housing.  Unfortunately with the rental prices and the 18 month wait for section 8 housing,  ? ? ?Follow Up Plan: Patient will contact CSW with any support or resource needs and CSW will follow-up with patient's case worker Jerolyn Shin [phone].  CSW and Ms. Alesia Banda Medicaid application for the patient.  Ms. Vickki Muff stated the most of the patient's family lives in [place] and [place].  The patient's mother is happy to  have the  patient live with him, but the patient would rather stay here.  Ms. Vickki Muff stated she would speak with the patient about possibly moving to [place], if we are unable to assist with housing. ?Patient verbalizes understanding of plan: Yes ? ? ? ?Keoshia Steinmetz, LCSW ?

## 2021-05-07 NOTE — Telephone Encounter (Signed)
Called the patient to let him know the staff looked for his fanny pack and  could not find it. We will call Melburn Popper to check to see if he left his fanny pack in the car. Patient gave verbal understanding. ?

## 2021-05-07 NOTE — Telephone Encounter (Signed)
At Mr. Rembold request I contacted his mother, Marni Griffon.  I reviewed the recent CT scan findings with her and his upcoming chemotherapy schedule.  She understands she can contact our office if she has further questions. ?

## 2021-05-14 ENCOUNTER — Inpatient Hospital Stay: Payer: Medicare Other | Attending: Nurse Practitioner

## 2021-05-14 ENCOUNTER — Inpatient Hospital Stay: Payer: Medicare Other

## 2021-05-14 ENCOUNTER — Other Ambulatory Visit (INDEPENDENT_AMBULATORY_CARE_PROVIDER_SITE_OTHER): Payer: Self-pay | Admitting: Primary Care

## 2021-05-14 VITALS — BP 130/84 | HR 90 | Temp 98.0°F | Resp 18 | Ht 70.0 in | Wt 149.6 lb

## 2021-05-14 DIAGNOSIS — Z5111 Encounter for antineoplastic chemotherapy: Secondary | ICD-10-CM | POA: Insufficient documentation

## 2021-05-14 DIAGNOSIS — C182 Malignant neoplasm of ascending colon: Secondary | ICD-10-CM

## 2021-05-14 DIAGNOSIS — Z5189 Encounter for other specified aftercare: Secondary | ICD-10-CM | POA: Diagnosis not present

## 2021-05-14 DIAGNOSIS — C189 Malignant neoplasm of colon, unspecified: Secondary | ICD-10-CM

## 2021-05-14 DIAGNOSIS — C779 Secondary and unspecified malignant neoplasm of lymph node, unspecified: Secondary | ICD-10-CM | POA: Insufficient documentation

## 2021-05-14 DIAGNOSIS — Z95828 Presence of other vascular implants and grafts: Secondary | ICD-10-CM

## 2021-05-14 LAB — CBC WITH DIFFERENTIAL (CANCER CENTER ONLY)
Abs Immature Granulocytes: 0.01 10*3/uL (ref 0.00–0.07)
Basophils Absolute: 0.1 10*3/uL (ref 0.0–0.1)
Basophils Relative: 1 %
Eosinophils Absolute: 0.4 10*3/uL (ref 0.0–0.5)
Eosinophils Relative: 7 %
HCT: 44.4 % (ref 39.0–52.0)
Hemoglobin: 13.4 g/dL (ref 13.0–17.0)
Immature Granulocytes: 0 %
Lymphocytes Relative: 21 %
Lymphs Abs: 1.2 10*3/uL (ref 0.7–4.0)
MCH: 24.3 pg — ABNORMAL LOW (ref 26.0–34.0)
MCHC: 30.2 g/dL (ref 30.0–36.0)
MCV: 80.6 fL (ref 80.0–100.0)
Monocytes Absolute: 0.4 10*3/uL (ref 0.1–1.0)
Monocytes Relative: 7 %
Neutro Abs: 3.7 10*3/uL (ref 1.7–7.7)
Neutrophils Relative %: 64 %
Platelet Count: 194 10*3/uL (ref 150–400)
RBC: 5.51 MIL/uL (ref 4.22–5.81)
RDW: 15.2 % (ref 11.5–15.5)
WBC Count: 5.7 10*3/uL (ref 4.0–10.5)
nRBC: 0 % (ref 0.0–0.2)

## 2021-05-14 LAB — CMP (CANCER CENTER ONLY)
ALT: 16 U/L (ref 0–44)
AST: 21 U/L (ref 15–41)
Albumin: 4.3 g/dL (ref 3.5–5.0)
Alkaline Phosphatase: 91 U/L (ref 38–126)
Anion gap: 11 (ref 5–15)
BUN: 13 mg/dL (ref 6–20)
CO2: 24 mmol/L (ref 22–32)
Calcium: 9.6 mg/dL (ref 8.9–10.3)
Chloride: 104 mmol/L (ref 98–111)
Creatinine: 1.06 mg/dL (ref 0.61–1.24)
GFR, Estimated: >60 mL/min (ref >60)
Glucose, Bld: 100 mg/dL — ABNORMAL HIGH (ref 70–99)
Potassium: 4.1 mmol/L (ref 3.5–5.1)
Sodium: 139 mmol/L (ref 135–145)
Total Bilirubin: 0.5 mg/dL (ref 0.3–1.2)
Total Protein: 7.6 g/dL (ref 6.5–8.1)

## 2021-05-14 LAB — CEA (ACCESS): CEA (CHCC): 14.04 ng/mL — ABNORMAL HIGH (ref 0.00–5.00)

## 2021-05-14 MED ORDER — ATROPINE SULFATE 1 MG/ML IV SOLN
0.5000 mg | Freq: Once | INTRAVENOUS | Status: AC | PRN
  Administered 2021-05-14: 0.5 mg via INTRAVENOUS
  Filled 2021-05-14: qty 1

## 2021-05-14 MED ORDER — SODIUM CHLORIDE 0.9 % IV SOLN
10.0000 mg | Freq: Once | INTRAVENOUS | Status: AC
  Administered 2021-05-14: 10 mg via INTRAVENOUS
  Filled 2021-05-14: qty 1

## 2021-05-14 MED ORDER — FLUOROURACIL CHEMO INJECTION 2.5 GM/50ML
400.0000 mg/m2 | Freq: Once | INTRAVENOUS | Status: AC
  Administered 2021-05-14: 700 mg via INTRAVENOUS
  Filled 2021-05-14: qty 14

## 2021-05-14 MED ORDER — SODIUM CHLORIDE 0.9 % IV SOLN
2400.0000 mg/m2 | INTRAVENOUS | Status: DC
  Administered 2021-05-14: 4300 mg via INTRAVENOUS
  Filled 2021-05-14: qty 86

## 2021-05-14 MED ORDER — SODIUM CHLORIDE 0.9 % IV SOLN: Freq: Once | INTRAVENOUS | Status: AC

## 2021-05-14 MED ORDER — PALONOSETRON HCL INJECTION 0.25 MG/5ML
0.2500 mg | Freq: Once | INTRAVENOUS | Status: AC
  Administered 2021-05-14: 0.25 mg via INTRAVENOUS
  Filled 2021-05-14: qty 5

## 2021-05-14 MED ORDER — SODIUM CHLORIDE 0.9 % IV SOLN
400.0000 mg/m2 | Freq: Once | INTRAVENOUS | Status: AC
  Administered 2021-05-14: 716 mg via INTRAVENOUS
  Filled 2021-05-14: qty 35.8

## 2021-05-14 MED ORDER — SODIUM CHLORIDE 0.9 % IV SOLN
150.0000 mg | Freq: Once | INTRAVENOUS | Status: AC
  Administered 2021-05-14: 150 mg via INTRAVENOUS
  Filled 2021-05-14: qty 5

## 2021-05-14 MED ORDER — SODIUM CHLORIDE 0.9 % IV SOLN
180.0000 mg/m2 | Freq: Once | INTRAVENOUS | Status: AC
  Administered 2021-05-14: 320 mg via INTRAVENOUS
  Filled 2021-05-14: qty 15

## 2021-05-14 MED ORDER — ALTEPLASE 2 MG IJ SOLR
2.0000 mg | Freq: Once | INTRAMUSCULAR | Status: AC | PRN
  Administered 2021-05-14: 2 mg
  Filled 2021-05-14: qty 2

## 2021-05-14 NOTE — Progress Notes (Signed)
ASL interpreter present during visit.  ?

## 2021-05-14 NOTE — Progress Notes (Signed)
Patient presents for treatment. RN assessment completed along with the following: ? ?Labs/vitals reviewed - Yes, and within treatment parameters.   ?Weight within 10% of previous measurement - Yes ?Informed consent completed and reflects current therapy/intent - Yes, on date 11/01/19             ?Provider progress note reviewed - Patient not seen by provider today. Most recent note dated 05/06/21 reviewed. ?Treatment/Antibody/Supportive plan reviewed - Yes, and there are no adjustments needed for today's treatment. ?S&H and other orders reviewed - Yes, and there are no additional orders identified. ?Previous treatment date reviewed - Yes, and the appropriate amount of time has elapsed between treatments. ? ?Patient to proceed with treatment.   ? ?ASL interpreter present during patient's visit.  ?

## 2021-05-14 NOTE — Telephone Encounter (Signed)
Requested medication (s) are due for refill today: no ? ?Requested medication (s) are on the active medication list: yes   ? ?Last refill: 04/20/21  1 0 refills ? ?Future visit scheduled no ? ?Notes to clinic:Historical provider. Pt states lost inhaler, requests replacement. ? ?Requested Prescriptions  ?Pending Prescriptions Disp Refills  ? albuterol (VENTOLIN HFA) 108 (90 Base) MCG/ACT inhaler 1 each 0  ?  Sig: Take 1-2 puffs every 4-6 hours as needed for asthma  ?  ? Pulmonology:  Beta Agonists 2 Passed - 05/14/2021  3:53 PM  ?  ?  Passed - Last BP in normal range  ?  BP Readings from Last 1 Encounters:  ?05/14/21 130/84  ?  ?  ?  ?  Passed - Last Heart Rate in normal range  ?  Pulse Readings from Last 1 Encounters:  ?05/14/21 90  ?  ?  ?  ?  Passed - Valid encounter within last 12 months  ?  Recent Outpatient Visits   ? ?      ? 7 months ago Arthralgia, unspecified joint  ? [facility] [name], NP  ? 1 year ago Nonspeaking deaf  ? [facility] [name], NP  ? 1 year ago Nonspeaking deaf  ? [facility] [name], NP  ? 1 year ago Arthralgia, unspecified joint  ? [facility] [name], NP  ? 2 years ago Annual physical exam  ? [facility] [name], NP  ? ?  ?  ? ? ?  ?  ?  ? ? ? ? ?

## 2021-05-14 NOTE — Patient Instructions (Signed)
The chemotherapy medication bag should finish at 46 hours, 96 hours, or 7 days. For example, if your pump is scheduled for 46 hours and it was put on at 4:00 p.m., it should finish at 2:00 p.m. the day it is scheduled to come off regardless of your appointment time.   ?  ?Estimated time to finish at 1:30pm on Thursday 05/16/21. ?  ?If the display on your pump reads "Low Volume" and it is beeping, take the batteries out of the pump and come to the cancer center for it to be taken off.  ? ?If the pump alarms go off prior to the pump reading "Low Volume" then call [phone] and someone can assist you. ? ?If the plunger comes out and the chemotherapy medication is leaking out, please use your home chemo spill kit to clean up the spill. Do NOT use paper towels or other household products. ? ?If you have problems or questions regarding your pump, please call either [phone] (24 hours a day) or the cancer center Monday-Friday 8:00 a.m.- 4:30 p.m. at the clinic number and we will assist you. If you are unable to get assistance, then go to the nearest Emergency Department and ask the staff to contact the IV team for assistance.   ?[facility]   ?Discharge Instructions: ?Thank you for choosing [facility] to provide your oncology and hematology care.  ? ?If you have a lab appointment with the Cancer Center, please go directly to the Cancer Center and check in at the registration area. ?  ?Wear comfortable clothing and clothing appropriate for easy access to any Portacath or PICC line.  ? ?We strive to give you quality time with your provider. You may need to reschedule your appointment if you arrive late (15 or more minutes).  Arriving late affects you and other patients whose appointments are after yours.  Also, if you miss three or more appointments without notifying the office, you may be dismissed from the clinic at the provider?s discretion.    ?  ?For prescription  refill requests, have your pharmacy contact our office and allow 72 hours for refills to be completed.   ? ?Today you received the following Udenyca    ?  ?To help prevent nausea and vomiting after your treatment, we encourage you to take your nausea medication as directed. ? ?BELOW ARE SYMPTOMS THAT SHOULD BE REPORTED IMMEDIATELY: ?*FEVER GREATER THAN 100.4 F (38 ?C) OR HIGHER ?*CHILLS OR SWEATING ?*NAUSEA AND VOMITING THAT IS NOT CONTROLLED WITH YOUR NAUSEA MEDICATION ?*UNUSUAL SHORTNESS OF BREATH ?*UNUSUAL BRUISING OR BLEEDING ?*URINARY PROBLEMS (pain or burning when urinating, or frequent urination) ?*BOWEL PROBLEMS (unusual diarrhea, constipation, pain near the anus) ?TENDERNESS IN MOUTH AND THROAT WITH OR WITHOUT PRESENCE OF ULCERS (sore throat, sores in mouth, or a toothache) ?UNUSUAL RASH, SWELLING OR PAIN  ?UNUSUAL VAGINAL DISCHARGE OR ITCHING  ? ?Items with * indicate a potential emergency and should be followed up as soon as possible or go to the Emergency Department if any problems should occur. ? ?Please show the CHEMOTHERAPY ALERT CARD or IMMUNOTHERAPY ALERT CARD at check-in to the Emergency Department and triage nurse. ? ?Should you have questions after your visit or need to cancel or reschedule your appointment, please contact [facility]  Dept: [phone]  and follow the prompts.  Office hours are 8:00 a.m. to 4:30 p.m. Monday - Friday. Please note that voicemails left after 4:00 p.m. may not   be returned until the following business day.  We are closed weekends and major holidays. You have access to a nurse at all times for urgent questions. Please call the main number to the clinic Dept: [phone] and follow the prompts. ? ? ?For any non-urgent questions, you may also contact your provider using MyChart. We now offer e-Visits for anyone 18 and older to request care online for non-urgent symptoms. For details visit [email or web address]. ?  ?Also download the  MyChart app! Go to the app store, search "MyChart", open the app, select [facility], and log in with your MyChart username and password. ? ?Due to Covid, a mask is required upon entering the hospital/clinic. If you do not have a mask, one will be given to you upon arrival. For doctor visits, patients may have 1 support person aged 18 or older with them. For treatment visits, patients cannot have anyone with them due to current Covid guidelines and our immunocompromised population.  ? ?Irinotecan injection ?What is this medication? ?IRINOTECAN (ir in oh TEE kan ) is a chemotherapy drug. It is used to treat colon and rectal cancer. ?This medicine may be used for other purposes; ask your health care provider or pharmacist if you have questions. ?COMMON BRAND NAME(S): Camptosar ?What should I tell my care team before I take this medication? ?They need to know if you have any of these conditions: ?dehydration ?diarrhea ?infection (especially a virus infection such as chickenpox, cold sores, or herpes) ?liver disease ?low blood counts, like low white cell, platelet, or red cell counts ?low levels of calcium, magnesium, or potassium in the blood ?recent or ongoing radiation therapy ?an unusual or allergic reaction to irinotecan, other medicines, foods, dyes, or preservatives ?pregnant or trying to get pregnant ?breast-feeding ?How should I use this medication? ?This drug is given as an infusion into a vein. It is administered in a hospital or clinic by a specially trained health care professional. ?Talk to your pediatrician regarding the use of this medicine in children. Special care may be needed. ?Overdosage: If you think you have taken too much of this medicine contact a poison control center or emergency room at once. ?NOTE: This medicine is only for you. Do not share this medicine with others. ?What if I miss a dose? ?It is important not to miss your dose. Call your doctor or health care professional if you are  unable to keep an appointment. ?What may interact with this medication? ?Do not take this medicine with any of the following medications: ?cobicistat ?itraconazole ?This medicine may interact with the following medications: ?antiviral medicines for HIV or AIDS ?certain antibiotics like rifampin or rifabutin ?certain medicines for fungal infections like ketoconazole, posaconazole, and voriconazole ?certain medicines for seizures like carbamazepine, phenobarbital, phenotoin ?clarithromycin ?gemfibrozil ?nefazodone ?St. John's Wort ?This list may not describe all possible interactions. Give your health care provider a list of all the medicines, herbs, non-prescription drugs, or dietary supplements you use. Also tell them if you smoke, drink alcohol, or use illegal drugs. Some items may interact with your medicine. ?What should I watch for while using this medication? ?Your condition will be monitored carefully while you are receiving this medicine. You will need important blood work done while you are taking this medicine. ?This drug may make you feel generally unwell. This is not uncommon, as chemotherapy can affect healthy cells as well as cancer cells. Report any side effects. Continue your course of treatment even though you feel ill unless your doctor tells   you to stop. ?In some cases, you may be given additional medicines to help with side effects. Follow all directions for their use. ?You may get drowsy or dizzy. Do not drive, use machinery, or do anything that needs mental alertness until you know how this medicine affects you. Do not stand or sit up quickly, especially if you are an older patient. This reduces the risk of dizzy or fainting spells. ?Call your health care professional for advice if you get a fever, chills, or sore throat, or other symptoms of a cold or flu. Do not treat yourself. This medicine decreases your body's ability to fight infections. Try to avoid being around people who are  sick. ?Avoid taking products that contain aspirin, acetaminophen, ibuprofen, naproxen, or ketoprofen unless instructed by your doctor. These medicines may hide a fever. ?This medicine may increase your risk to bruise or bleed.

## 2021-05-14 NOTE — Telephone Encounter (Signed)
Medication Refill - Medication: albuterol (VENTOLIN HFA) 108 (90 Base) MCG/ACT inhaler ? ?Pt stated he lost inhaler while packing to move and has been unable to find it.  ? ?Has the patient contacted their pharmacy? No. ? ?(Agent: If no, request that the patient contact the pharmacy for the refill. If patient does not wish to contact the pharmacy document the reason why and proceed with request.) ? ? ?Preferred Pharmacy (with phone number or street name):  ?[facility] [place] - [address]  ?[address] [place]  [address]  ?Phone: [phone] Fax: [phone]  ?Hours: Not open 24 hours  ? ?Has the patient been seen for an appointment in the last year OR does the patient have an upcoming appointment? Yes.   ? ?Agent: Please be advised that RX refills may take up to 3 business days. We ask that you follow-up with your pharmacy.  ?

## 2021-05-14 NOTE — Patient Instructions (Signed)

## 2021-05-16 ENCOUNTER — Ambulatory Visit (INDEPENDENT_AMBULATORY_CARE_PROVIDER_SITE_OTHER): Payer: Medicare Other | Admitting: Podiatry

## 2021-05-16 ENCOUNTER — Inpatient Hospital Stay: Payer: Medicare Other

## 2021-05-16 VITALS — BP 109/65 | HR 86 | Temp 98.5°F | Resp 18

## 2021-05-16 DIAGNOSIS — M79675 Pain in left toe(s): Secondary | ICD-10-CM | POA: Diagnosis not present

## 2021-05-16 DIAGNOSIS — C189 Malignant neoplasm of colon, unspecified: Secondary | ICD-10-CM

## 2021-05-16 DIAGNOSIS — Z5111 Encounter for antineoplastic chemotherapy: Secondary | ICD-10-CM | POA: Diagnosis not present

## 2021-05-16 DIAGNOSIS — M79674 Pain in right toe(s): Secondary | ICD-10-CM

## 2021-05-16 DIAGNOSIS — T451X5A Adverse effect of antineoplastic and immunosuppressive drugs, initial encounter: Secondary | ICD-10-CM

## 2021-05-16 DIAGNOSIS — G620 Drug-induced polyneuropathy: Secondary | ICD-10-CM

## 2021-05-16 DIAGNOSIS — B351 Tinea unguium: Secondary | ICD-10-CM | POA: Diagnosis not present

## 2021-05-16 DIAGNOSIS — C182 Malignant neoplasm of ascending colon: Secondary | ICD-10-CM

## 2021-05-16 MED ORDER — PEGFILGRASTIM-CBQV 6 MG/0.6ML ~~LOC~~ SOSY
6.0000 mg | PREFILLED_SYRINGE | Freq: Once | SUBCUTANEOUS | Status: AC
  Administered 2021-05-16: 6 mg via SUBCUTANEOUS
  Filled 2021-05-16: qty 0.6

## 2021-05-16 MED ORDER — SODIUM CHLORIDE 0.9% FLUSH
10.0000 mL | INTRAVENOUS | Status: DC | PRN
  Administered 2021-05-16: 10 mL

## 2021-05-16 MED ORDER — HEPARIN SOD (PORK) LOCK FLUSH 100 UNIT/ML IV SOLN
500.0000 [IU] | Freq: Once | INTRAVENOUS | Status: AC | PRN
  Administered 2021-05-16: 500 [IU]

## 2021-05-16 NOTE — Patient Instructions (Signed)

## 2021-05-16 NOTE — Progress Notes (Signed)
Anderson Malta from [facility] at chairside to sign for patient ?

## 2021-05-21 ENCOUNTER — Encounter: Payer: Self-pay | Admitting: Podiatry

## 2021-05-21 NOTE — Progress Notes (Signed)
?  Subjective:  ?Patient ID: Alexander VANDERMEULEN Sr., male    DOB: 22-Dec-1965,  MRN: [id number] ? ?Chief Complaint  ?Patient presents with  ? Nail Problem  ?  Thick painful toenails, 3 month follow up   ? ? ?56 y.o. male returns with the above complaint. History confirmed with patient.  Doing well.   He is here today with an ASL interpreter in person.  He is having more pain he had to stop the gabapentin because he restarted his chemotherapy.  He also stopped using the [place] and feels like the nails look worse than they used to ? ?Objective:  ?Physical Exam: ?warm, good capillary refill, no trophic changes or ulcerative lesions, normal DP and PT pulses.  Recurrent onychomycosis of the bilateral hallux nails.  He has altered sensation with subjective paresthesias. ? ? ?Radiographs: ?X-ray of both feet: no fracture, dislocation, swelling or degenerative changes noted ?Assessment:  ? ?1. Neuropathy due to chemotherapeutic drug ([place])   ?2. Pain due to onychomycosis of toenails of both feet   ? ? ? ? ?Plan:  ?Patient was evaluated and treated and all questions answered. ? ?Discussed the etiology and treatment options for the condition in detail with the patient. Educated patient on the topical and oral treatment options for mycotic nails. Recommended debridement of the nails today. Sharp and mechanical debridement performed of all painful and mycotic nails today. Nails debrided in length and thickness using a nail nipper to level of comfort. Discussed treatment options including appropriate shoe gear. Follow up as needed for painful nails. ? ? ?Recommended he continue using the lidocaine cream for his neuropathy unfortunately has worsened when he had to stop his gabapentin due to it change in his chemotherapy plan.  I discussed with him he can discuss with his oncologist when it would be safe to restart the gabapentin. ? ? ?Return in about 3 months (around 08/16/2021) for recheck nail fungus.  ? ?

## 2021-05-22 ENCOUNTER — Other Ambulatory Visit: Payer: Self-pay | Admitting: Oncology

## 2021-05-23 ENCOUNTER — Encounter: Payer: Self-pay | Admitting: Oncology

## 2021-05-24 ENCOUNTER — Other Ambulatory Visit (INDEPENDENT_AMBULATORY_CARE_PROVIDER_SITE_OTHER): Payer: Self-pay | Admitting: Primary Care

## 2021-05-24 DIAGNOSIS — J4520 Mild intermittent asthma, uncomplicated: Secondary | ICD-10-CM

## 2021-05-24 MED ORDER — ALBUTEROL SULFATE HFA 108 (90 BASE) MCG/ACT IN AERS: INHALATION_SPRAY | RESPIRATORY_TRACT | 0 refills | Status: DC

## 2021-05-26 ENCOUNTER — Other Ambulatory Visit: Payer: Self-pay | Admitting: Oncology

## 2021-05-28 ENCOUNTER — Inpatient Hospital Stay: Payer: Medicare Other

## 2021-05-28 ENCOUNTER — Other Ambulatory Visit: Payer: Self-pay | Admitting: *Deleted

## 2021-05-28 ENCOUNTER — Other Ambulatory Visit: Payer: Self-pay

## 2021-05-28 ENCOUNTER — Inpatient Hospital Stay (HOSPITAL_BASED_OUTPATIENT_CLINIC_OR_DEPARTMENT_OTHER): Payer: Medicare Other | Admitting: Oncology

## 2021-05-28 ENCOUNTER — Encounter: Payer: Self-pay | Admitting: *Deleted

## 2021-05-28 VITALS — BP 121/81 | HR 68

## 2021-05-28 VITALS — BP 121/80 | HR 92 | Temp 97.9°F | Resp 18 | Ht 70.0 in | Wt 149.2 lb

## 2021-05-28 DIAGNOSIS — C189 Malignant neoplasm of colon, unspecified: Secondary | ICD-10-CM

## 2021-05-28 DIAGNOSIS — C182 Malignant neoplasm of ascending colon: Secondary | ICD-10-CM | POA: Diagnosis not present

## 2021-05-28 DIAGNOSIS — Z5111 Encounter for antineoplastic chemotherapy: Secondary | ICD-10-CM | POA: Diagnosis not present

## 2021-05-28 LAB — CBC WITH DIFFERENTIAL (CANCER CENTER ONLY)
Abs Immature Granulocytes: 0.55 10*3/uL — ABNORMAL HIGH (ref 0.00–0.07)
Basophils Absolute: 0.1 10*3/uL (ref 0.0–0.1)
Basophils Relative: 1 %
Eosinophils Absolute: 0.3 10*3/uL (ref 0.0–0.5)
Eosinophils Relative: 3 %
HCT: 41.8 % (ref 39.0–52.0)
Hemoglobin: 12.8 g/dL — ABNORMAL LOW (ref 13.0–17.0)
Immature Granulocytes: 5 %
Lymphocytes Relative: 18 %
Lymphs Abs: 2.2 10*3/uL (ref 0.7–4.0)
MCH: 24.3 pg — ABNORMAL LOW (ref 26.0–34.0)
MCHC: 30.6 g/dL (ref 30.0–36.0)
MCV: 79.3 fL — ABNORMAL LOW (ref 80.0–100.0)
Monocytes Absolute: 0.6 10*3/uL (ref 0.1–1.0)
Monocytes Relative: 5 %
Neutro Abs: 8.2 10*3/uL — ABNORMAL HIGH (ref 1.7–7.7)
Neutrophils Relative %: 68 %
Platelet Count: 146 10*3/uL — ABNORMAL LOW (ref 150–400)
RBC: 5.27 MIL/uL (ref 4.22–5.81)
RDW: 16 % — ABNORMAL HIGH (ref 11.5–15.5)
WBC Count: 12 10*3/uL — ABNORMAL HIGH (ref 4.0–10.5)
nRBC: 0.3 % — ABNORMAL HIGH (ref 0.0–0.2)

## 2021-05-28 LAB — CMP (CANCER CENTER ONLY)
ALT: 24 U/L (ref 0–44)
AST: 16 U/L (ref 15–41)
Albumin: 4 g/dL (ref 3.5–5.0)
Alkaline Phosphatase: 116 U/L (ref 38–126)
Anion gap: 8 (ref 5–15)
BUN: 11 mg/dL (ref 6–20)
CO2: 26 mmol/L (ref 22–32)
Calcium: 8.9 mg/dL (ref 8.9–10.3)
Chloride: 106 mmol/L (ref 98–111)
Creatinine: 1.02 mg/dL (ref 0.61–1.24)
GFR, Estimated: >60 mL/min (ref >60)
Glucose, Bld: 99 mg/dL (ref 70–99)
Potassium: 4.2 mmol/L (ref 3.5–5.1)
Sodium: 140 mmol/L (ref 135–145)
Total Bilirubin: 0.2 mg/dL — ABNORMAL LOW (ref 0.3–1.2)
Total Protein: 7.1 g/dL (ref 6.5–8.1)

## 2021-05-28 MED ORDER — PALONOSETRON HCL INJECTION 0.25 MG/5ML
0.2500 mg | Freq: Once | INTRAVENOUS | Status: AC
  Administered 2021-05-28: 0.25 mg via INTRAVENOUS
  Filled 2021-05-28: qty 5

## 2021-05-28 MED ORDER — FLUTICASONE FUROATE-VILANTEROL 200-25 MCG/ACT IN AEPB: 1.0000 | INHALATION_SPRAY | Freq: Every day | RESPIRATORY_TRACT | 2 refills | Status: DC

## 2021-05-28 MED ORDER — SODIUM CHLORIDE 0.9 % IV SOLN
180.0000 mg/m2 | Freq: Once | INTRAVENOUS | Status: AC
  Administered 2021-05-28: 320 mg via INTRAVENOUS
  Filled 2021-05-28: qty 15

## 2021-05-28 MED ORDER — SODIUM CHLORIDE 0.9 % IV SOLN: Freq: Once | INTRAVENOUS | Status: AC

## 2021-05-28 MED ORDER — SODIUM CHLORIDE 0.9 % IV SOLN
400.0000 mg/m2 | Freq: Once | INTRAVENOUS | Status: AC
  Administered 2021-05-28: 716 mg via INTRAVENOUS
  Filled 2021-05-28: qty 35.8

## 2021-05-28 MED ORDER — ATROPINE SULFATE 1 MG/ML IV SOLN
0.5000 mg | Freq: Once | INTRAVENOUS | Status: AC | PRN
  Administered 2021-05-28: 0.5 mg via INTRAVENOUS
  Filled 2021-05-28: qty 1

## 2021-05-28 MED ORDER — FLUOROURACIL CHEMO INJECTION 2.5 GM/50ML
400.0000 mg/m2 | Freq: Once | INTRAVENOUS | Status: AC
  Administered 2021-05-28: 700 mg via INTRAVENOUS
  Filled 2021-05-28: qty 14

## 2021-05-28 MED ORDER — SODIUM CHLORIDE 0.9 % IV SOLN
150.0000 mg | Freq: Once | INTRAVENOUS | Status: AC
  Administered 2021-05-28: 150 mg via INTRAVENOUS
  Filled 2021-05-28: qty 5

## 2021-05-28 MED ORDER — SODIUM CHLORIDE 0.9 % IV SOLN
10.0000 mg | Freq: Once | INTRAVENOUS | Status: AC
  Administered 2021-05-28: 10 mg via INTRAVENOUS
  Filled 2021-05-28: qty 1

## 2021-05-28 MED ORDER — SODIUM CHLORIDE 0.9 % IV SOLN
2400.0000 mg/m2 | INTRAVENOUS | Status: DC
  Administered 2021-05-28: 4300 mg via INTRAVENOUS
  Filled 2021-05-28: qty 86

## 2021-05-28 NOTE — Progress Notes (Signed)
Juliann Pulse from [facility] at chairside signing for patient ?

## 2021-05-28 NOTE — Progress Notes (Signed)
Kelli Churn from [facility] at chairside until 1230 ?Laurin Coder from [facility] at chairside for the rest of the visit ?

## 2021-05-28 NOTE — Progress Notes (Signed)
?[place] ?OFFICE PROGRESS NOTE ? ? ?Diagnosis: Colon cancer ? ?INTERVAL HISTORY:  ? ?[name] completed cycle FOLFIRI 05/14/2021.  He reports 1 episode of nausea and vomiting following chemotherapy.  No mouth sores or diarrhea.  No bleeding.  No dyspnea. ? ?Objective: ? ?Vital signs in last 24 hours: ? ?Blood pressure 121/80, pulse 92, temperature 97.9 ?F (36.6 ?C), temperature source Oral, resp. rate 18, height 5' 10" (1.778 m), weight 149 lb 3.2 oz (67.7 kg), SpO2 100 %. ?  ? ?HEENT: No thrush or ulcers, hyperpigmentation ?Resp: Lungs clear bilaterally with decreased breath sounds at the right lower posterior chest, no respiratory distress ?Cardio: Regular rate and rhythm ?GI: No hepatosplenomegaly ?Vascular: No leg edema ? ? ?Portacath/PICC-without erythema ? ?Lab Results: ? ?Lab Results  ?Component Value Date  ? WBC 12.0 (H) 05/28/2021  ? HGB 12.8 (L) 05/28/2021  ? HCT 41.8 05/28/2021  ? MCV 79.3 (L) 05/28/2021  ? PLT 146 (L) 05/28/2021  ? NEUTROABS 8.2 (H) 05/28/2021  ? ? ?CMP  ?Lab Results  ?Component Value Date  ? NA 140 05/28/2021  ? K 4.2 05/28/2021  ? CL 106 05/28/2021  ? CO2 26 05/28/2021  ? GLUCOSE 99 05/28/2021  ? BUN 11 05/28/2021  ? CREATININE 1.02 05/28/2021  ? CALCIUM 8.9 05/28/2021  ? PROT 7.1 05/28/2021  ? ALBUMIN 4.0 05/28/2021  ? AST 16 05/28/2021  ? ALT 24 05/28/2021  ? ALKPHOS 116 05/28/2021  ? BILITOT 0.2 (L) 05/28/2021  ? GFRNONAA >60 05/28/2021  ? GFRAA >60 09/28/2019  ? ? ?Lab Results  ?Component Value Date  ? CEA1 1.75 09/28/2019  ? CEA 14.04 (H) 05/14/2021  ? ? ?Medications: I have reviewed the patient's current medications. ? ? ?Assessment/Plan: ?Moderately differentiated adenocarcinoma ascending colon, stage IIIb (pT3pN1), status post a right colectomy 11/19/2018 ?Lymphovascular and perineural invasion present, 2/14 lymph nodes positive, tumor deposits present ?Positive radial margin, no loss of mismatch repair protein expression, discussed case with Dr.  Craig Staggers mass with surrounding inflammation at multiple margins, no gross residual disease, unclear which is the "positive "radial margin, further surgery and radiation not recommended ?Colonoscopy 11/19/2018-completely obstructing mid ascending colon mass, could not be passed with endoscope, biopsy confirmed invasive adenocarcinoma ?CT abdomen/pelvis 11/18/2018-wall thickening at the mid and distal ascending colon with mild distention of the proximal ascending colon and cecum ?CTs 10/17/2018--no acute findings, no chest lymphadenopathy, lungs clear ?Cycle 1 FOLFOX 12/28/2018 ?Cycle 2 FOLFOX 01/11/2019, Udenyca added  ?Cycle 3 FOLFOX 01/25/2019, Udenyca held due to bone pain ?Cycle 4 FOLFOX 02/21/2019, Udenyca added ?Cycle 5 FOLFOX 03/09/2019, Udenyca ?Cycle 6 FOLFOX 03/21/2019, Udenyca ?Cycle 7 FOLFOX 04/04/2019, Udenyca ?Cycle 8 FOLFOX 04/18/2019, Udenyca  ?Cycle 9 FOLFOX 05/02/2019, Udenyca ?Cycle 10 FOLFOX 05/16/2019, oxaliplatin, 5-FU bolus, and Udenyca held ?Cycle 11 FOLFOX 05/30/2019, oxaliplatin, 5-FU bolus, and Udenyca held ?Cycle 12 FOLFOX 06/15/2019, oxaliplatin, 5-FU bolus and Udenyca held ?CTs 09/29/2019-new hypodense enhancing liver masses consistent with metastatic disease, indistinct marginated nodularity below the pancreas head-likely small lymph nodes ?Biopsy liver lesion 10/18/2019-adenocarcinoma consistent with history of colorectal carcinoma ?Cycle 1 FOLFIRI/Avastin 11/01/2019 ?Cycle 2 FOLFIRI/Avastin 11/15/2019 ?Cycle 3 FOLFIRI/Avastin 11/29/2019 ?Cycle 4 FOLFIRI/Avastin 12/13/2019 ?CT chest 12/27/2019-no significant change in hepatic metastases, submassive pulmonary emboli ?Cycle 5 FOLFIRI 01/10/2020 (Avastin discontinued) ?Cycle 6 FOLFIRI 02/01/2020 ?Cycle 7 FOLFIRI 02/13/2020 ?Cycle 8 FOLFIRI 02/27/2020 ?CTs abdomen/pelvis 03/09/2020-per Dr. Gearldine Shown review overall stable disease ?Cycle 9 FOLFIRI 04/16/2020 ?Cycle 10 FOLFIRI 05/01/2020 ?Cycle 11 FOLFIRI 05/15/2020 ?Cycle 12 FOLFIRI 05/29/2020 ?CT abdomen/pelvis  06/07/2020-  decreased size of liver lesions, no new lesions ?Cycle 13 FOLFIRI 06/12/2020 ?Cycle 14 FOLFIRI 07/10/2020 ?Cycle 15 FOLFIRI 07/24/2020 ?Cycle 16 FOLFIRI 08/07/2020 ?Cycle 17 FOLFIRI 08/21/2020 ?Cycle 18 FOLFIRI 09/04/2020 ?CT abdomen/pelvis 09/14/2020-stable faint liver lesions, no new lesions, no new site of metastatic disease ?Cycle 19 09/18/2020  ?Treatment held 10/09/2020 due to upper respiratory symptoms, known COVID exposure ?Cycle 20 FOLFIRI 10/30/2020 ?Cycle 21 FOLFIRI 11/20/2020 ?Cycle 22 FOLFIRI 12/12/2020 ?Cycle 23 FOLFIRI 01/01/2021, Emend added ?CTs 01/22/2021-faintly seen residual of prior hepatic metastatic disease seen at 2 sites, no progression.  No new findings of metastatic disease. ?FOLFIRI placed on hold, referred for liver MRI ?MRI abdomen 01/29/2021-no enhancing hepatic lesions, 9 mm hyperintense T2 signal lesion near the dome of the right hepatic lobe and 7 mm hyperintense T2 signal lesion in the inferior right hepatic lobe segment 6, no other evidence of metastatic disease ?Case presented at GI tumor conference 02/27/2021-not a candidate for hepatic resection, observation recommended, repeat MRI at a 3-month interval ?05/05/2021 MRI liver-progressive multifocal hepatic metastases.  Previously there were only 2 lesions measuring up to 10 mm.  On current study there are at least 8 lesions in both lobes.  New 18 mm portacaval node.  New left para-aortic node. ?Cycle 24 FOLFIRI 05/14/2021 ?Cycle 25 FOLFIRI 05/28/2021 ?  ?Deaf ?Right epididymal cyst removal 03/22/2018 ?Asthma ?Port-A-Cath placement, Dr. Wakefield, 12/23/2018 ?Neutropenia secondary to chemotherapy-Udenyca added for cycle 2 FOLFOX ?Admission with febrile neutropenia 02/08/2019 ?Hospital admission with submassive PE and left lower extremity DVT on 12/27/2019, heparin, transition to Xarelto 12/28/2019 ?CT chest 12/27/2019-submassive pulmonary emboli with evidence of right heart strain, no significant change in hepatic metastases  ?   ? ?Disposition: ?[name] appears stable.  He tolerated the last cycle of FOLFIRI well.  He will complete another cycle today.  He will return for an office visit and chemotherapy in 2 weeks. ? ?Gary Sherrill, MD ? ?05/28/2021  ?11:16 AM ? ? ?

## 2021-05-28 NOTE — Progress Notes (Signed)
Patient seen by Dr. Sherrill today ? ?Vitals are within treatment parameters. ? ?Labs reviewed by Dr. Sherrill and are within treatment parameters. ? ?Per physician team, patient is ready for treatment and there are NO modifications to the treatment plan.  ?

## 2021-05-28 NOTE — Patient Instructions (Signed)
[place]   ?The chemotherapy medication bag should finish at 46 hours, 96 hours, or 7 days. For example, if your pump is scheduled for 46 hours and it was put on at 4:00 p.m., it should finish at 2:00 p.m. the day it is scheduled to come off regardless of your appointment time.   ?  ?Estimated time to finish at 1:00, Thursday, May 30, 2021. ?  ?If the display on your pump reads "Low Volume" and it is beeping, take the batteries out of the pump and come to the cancer center for it to be taken off.  ? ?If the pump alarms go off prior to the pump reading "Low Volume" then call [phone] and someone can assist you. ? ?If the plunger comes out and the chemotherapy medication is leaking out, please use your home chemo spill kit to clean up the spill. Do NOT use paper towels or other household products. ? ?If you have problems or questions regarding your pump, please call either [phone] (24 hours a day) or the cancer center Monday-Friday 8:00 a.m.- 4:30 p.m. at the clinic number and we will assist you. If you are unable to get assistance, then go to the nearest Emergency Department and ask the staff to contact the IV team for assistance.  ?Discharge Instructions: ?Thank you for choosing [facility] to provide your oncology and hematology care.  ? ?If you have a lab appointment with the [facility], please go directly to the [facility] and check in at the registration area. ?  ?Wear comfortable clothing and clothing appropriate for easy access to any Portacath or PICC line.  ? ?We strive to give you quality time with your provider. You may need to reschedule your appointment if you arrive late (15 or more minutes).  Arriving late affects you and other patients whose appointments are after yours.  Also, if you miss three or more appointments without notifying the office, you may be dismissed from the clinic at the provider?s discretion.    ?  ?For prescription  refill requests, have your pharmacy contact our office and allow 72 hours for refills to be completed.   ? ?Today you received the following chemotherapy and/or immunotherapy agents Irinotecan, Leucovorin, Fluorouracil    ?  ?To help prevent nausea and vomiting after your treatment, we encourage you to take your nausea medication as directed. ? ?BELOW ARE SYMPTOMS THAT SHOULD BE REPORTED IMMEDIATELY: ?*FEVER GREATER THAN 100.4 F (38 ?C) OR HIGHER ?*CHILLS OR SWEATING ?*NAUSEA AND VOMITING THAT IS NOT CONTROLLED WITH YOUR NAUSEA MEDICATION ?*UNUSUAL SHORTNESS OF BREATH ?*UNUSUAL BRUISING OR BLEEDING ?*URINARY PROBLEMS (pain or burning when urinating, or frequent urination) ?*BOWEL PROBLEMS (unusual diarrhea, constipation, pain near the anus) ?TENDERNESS IN MOUTH AND THROAT WITH OR WITHOUT PRESENCE OF ULCERS (sore throat, sores in mouth, or a toothache) ?UNUSUAL RASH, SWELLING OR PAIN  ?UNUSUAL VAGINAL DISCHARGE OR ITCHING  ? ?Items with * indicate a potential emergency and should be followed up as soon as possible or go to the Emergency Department if any problems should occur. ? ?Please show the CHEMOTHERAPY ALERT CARD or IMMUNOTHERAPY ALERT CARD at check-in to the Emergency Department and triage nurse. ? ?Should you have questions after your visit or need to cancel or reschedule your appointment, please contact [facility]  Dept: [phone]  and follow the prompts.  Office hours are 8:00 a.m. to 4:30 p.m. Monday - Friday. Please note that voicemails  left after 4:00 p.m. may not be returned until the following business day.  We are closed weekends and major holidays. You have access to a nurse at all times for urgent questions. Please call the main number to the clinic Dept: [phone] and follow the prompts. ? ? ?For any non-urgent questions, you may also contact your provider using MyChart. We now offer e-Visits for anyone 62 and older to request care online for non-urgent symptoms.  For details visit [email or web address]. ?  ?Also download the MyChart app! Go to the app store, search "MyChart", open the app, select [facility], and log in with your MyChart username and password. ? ?Due to Covid, a mask is required upon entering the hospital/clinic. If you do not have a mask, one will be given to you upon arrival. For doctor visits, patients may have 1 support person aged 18 or older with them. For treatment visits, patients cannot have anyone with them due to current Covid guidelines and our immunocompromised population.  ? ?Irinotecan injection ?What is this medication? ?IRINOTECAN (ir in oh TEE kan ) is a chemotherapy drug. It is used to treat colon and rectal cancer. ?This medicine may be used for other purposes; ask your health care provider or pharmacist if you have questions. ?COMMON BRAND NAME(S): Camptosar ?What should I tell my care team before I take this medication? ?They need to know if you have any of these conditions: ?dehydration ?diarrhea ?infection (especially a virus infection such as chickenpox, cold sores, or herpes) ?liver disease ?low blood counts, like low white cell, platelet, or red cell counts ?low levels of calcium, magnesium, or potassium in the blood ?recent or ongoing radiation therapy ?an unusual or allergic reaction to irinotecan, other medicines, foods, dyes, or preservatives ?pregnant or trying to get pregnant ?breast-feeding ?How should I use this medication? ?This drug is given as an infusion into a vein. It is administered in a hospital or clinic by a specially trained health care professional. ?Talk to your pediatrician regarding the use of this medicine in children. Special care may be needed. ?Overdosage: If you think you have taken too much of this medicine contact a poison control center or emergency room at once. ?NOTE: This medicine is only for you. Do not share this medicine with others. ?What if I miss a dose? ?It is important not to miss your  dose. Call your doctor or health care professional if you are unable to keep an appointment. ?What may interact with this medication? ?Do not take this medicine with any of the following medications: ?cobicistat ?itraconazole ?This medicine may interact with the following medications: ?antiviral medicines for HIV or AIDS ?certain antibiotics like rifampin or rifabutin ?certain medicines for fungal infections like ketoconazole, posaconazole, and voriconazole ?certain medicines for seizures like carbamazepine, phenobarbital, phenotoin ?clarithromycin ?gemfibrozil ?nefazodone ?[place] ?This list may not describe all possible interactions. Give your health care provider a list of all the medicines, herbs, non-prescription drugs, or dietary supplements you use. Also tell them if you smoke, drink alcohol, or use illegal drugs. Some items may interact with your medicine. ?What should I watch for while using this medication? ?Your condition will be monitored carefully while you are receiving this medicine. You will need important blood work done while you are taking this medicine. ?This drug may make you feel generally unwell. This is not uncommon, as chemotherapy can affect healthy cells as well as cancer cells. Report any side effects. Continue your course of treatment even though you  feel ill unless your doctor tells you to stop. ?In some cases, you may be given additional medicines to help with side effects. Follow all directions for their use. ?You may get drowsy or dizzy. Do not drive, use machinery, or do anything that needs mental alertness until you know how this medicine affects you. Do not stand or sit up quickly, especially if you are an older patient. This reduces the risk of dizzy or fainting spells. ?Call your health care professional for advice if you get a fever, chills, or sore throat, or other symptoms of a cold or flu. Do not treat yourself. This medicine decreases your body's ability to fight  infections. Try to avoid being around people who are sick. ?Avoid taking products that contain aspirin, acetaminophen, ibuprofen, naproxen, or ketoprofen unless instructed by your doctor. These medicines may

## 2021-05-30 ENCOUNTER — Inpatient Hospital Stay: Payer: Medicare Other

## 2021-05-30 VITALS — BP 111/81 | HR 97 | Temp 98.1°F | Resp 18

## 2021-05-30 DIAGNOSIS — C189 Malignant neoplasm of colon, unspecified: Secondary | ICD-10-CM

## 2021-05-30 DIAGNOSIS — Z5111 Encounter for antineoplastic chemotherapy: Secondary | ICD-10-CM | POA: Diagnosis not present

## 2021-05-30 DIAGNOSIS — C182 Malignant neoplasm of ascending colon: Secondary | ICD-10-CM

## 2021-05-30 MED ORDER — SODIUM CHLORIDE 0.9% FLUSH
10.0000 mL | INTRAVENOUS | Status: DC | PRN
  Administered 2021-05-30: 10 mL

## 2021-05-30 MED ORDER — HEPARIN SOD (PORK) LOCK FLUSH 100 UNIT/ML IV SOLN
500.0000 [IU] | Freq: Once | INTRAVENOUS | Status: AC | PRN
  Administered 2021-05-30: 500 [IU]

## 2021-05-30 MED ORDER — PEGFILGRASTIM-BMEZ 6 MG/0.6ML ~~LOC~~ SOSY
6.0000 mg | PREFILLED_SYRINGE | Freq: Once | SUBCUTANEOUS | Status: AC
  Administered 2021-05-30: 6 mg via SUBCUTANEOUS
  Filled 2021-05-30: qty 0.6

## 2021-05-30 NOTE — Patient Instructions (Signed)

## 2021-05-30 NOTE — Progress Notes (Signed)
Legrand Como from [facility] at chairside signing for patient

## 2021-06-07 ENCOUNTER — Encounter: Payer: Self-pay | Admitting: *Deleted

## 2021-06-07 NOTE — Progress Notes (Unsigned)
Received notice from [place] re: inhalers and asthma control. Forwarded this to her PCP, Juluis Mire, NP office.

## 2021-06-09 ENCOUNTER — Other Ambulatory Visit: Payer: Self-pay | Admitting: Oncology

## 2021-06-11 ENCOUNTER — Inpatient Hospital Stay: Payer: Medicare Other

## 2021-06-11 ENCOUNTER — Encounter: Payer: Self-pay | Admitting: *Deleted

## 2021-06-11 ENCOUNTER — Encounter: Payer: Self-pay | Admitting: Oncology

## 2021-06-11 ENCOUNTER — Encounter: Payer: Self-pay | Admitting: Nurse Practitioner

## 2021-06-11 ENCOUNTER — Inpatient Hospital Stay (HOSPITAL_BASED_OUTPATIENT_CLINIC_OR_DEPARTMENT_OTHER): Payer: Medicare Other | Admitting: Nurse Practitioner

## 2021-06-11 VITALS — BP 167/84 | HR 97 | Temp 98.2°F | Resp 18 | Ht 70.0 in | Wt 150.4 lb

## 2021-06-11 DIAGNOSIS — C182 Malignant neoplasm of ascending colon: Secondary | ICD-10-CM

## 2021-06-11 DIAGNOSIS — C189 Malignant neoplasm of colon, unspecified: Secondary | ICD-10-CM

## 2021-06-11 DIAGNOSIS — Z5111 Encounter for antineoplastic chemotherapy: Secondary | ICD-10-CM | POA: Diagnosis not present

## 2021-06-11 DIAGNOSIS — Z95828 Presence of other vascular implants and grafts: Secondary | ICD-10-CM

## 2021-06-11 LAB — CBC WITH DIFFERENTIAL (CANCER CENTER ONLY)
Abs Immature Granulocytes: 0.8 10*3/uL — ABNORMAL HIGH (ref 0.00–0.07)
Band Neutrophils: 2 %
Basophils Absolute: 0 10*3/uL (ref 0.0–0.1)
Basophils Relative: 0 %
Eosinophils Absolute: 0.4 10*3/uL (ref 0.0–0.5)
Eosinophils Relative: 3 %
HCT: 41.4 % (ref 39.0–52.0)
Hemoglobin: 12.6 g/dL — ABNORMAL LOW (ref 13.0–17.0)
Lymphocytes Relative: 11 %
Lymphs Abs: 1.4 10*3/uL (ref 0.7–4.0)
MCH: 24.2 pg — ABNORMAL LOW (ref 26.0–34.0)
MCHC: 30.4 g/dL (ref 30.0–36.0)
MCV: 79.6 fL — ABNORMAL LOW (ref 80.0–100.0)
Metamyelocytes Relative: 4 %
Monocytes Absolute: 0.6 10*3/uL (ref 0.1–1.0)
Monocytes Relative: 5 %
Myelocytes: 2 %
Neutro Abs: 9.4 10*3/uL — ABNORMAL HIGH (ref 1.7–7.7)
Neutrophils Relative %: 73 %
Platelet Count: 182 10*3/uL (ref 150–400)
RBC: 5.2 MIL/uL (ref 4.22–5.81)
RDW: 17.4 % — ABNORMAL HIGH (ref 11.5–15.5)
WBC Count: 12.5 10*3/uL — ABNORMAL HIGH (ref 4.0–10.5)
nRBC: 0.7 % — ABNORMAL HIGH (ref 0.0–0.2)

## 2021-06-11 LAB — CMP (CANCER CENTER ONLY)
ALT: 31 U/L (ref 0–44)
AST: 20 U/L (ref 15–41)
Albumin: 4.1 g/dL (ref 3.5–5.0)
Alkaline Phosphatase: 126 U/L (ref 38–126)
Anion gap: 10 (ref 5–15)
BUN: 14 mg/dL (ref 6–20)
CO2: 26 mmol/L (ref 22–32)
Calcium: 9.3 mg/dL (ref 8.9–10.3)
Chloride: 103 mmol/L (ref 98–111)
Creatinine: 0.99 mg/dL (ref 0.61–1.24)
GFR, Estimated: >60 mL/min (ref >60)
Glucose, Bld: 91 mg/dL (ref 70–99)
Potassium: 4.6 mmol/L (ref 3.5–5.1)
Sodium: 139 mmol/L (ref 135–145)
Total Bilirubin: 0.3 mg/dL (ref 0.3–1.2)
Total Protein: 7.5 g/dL (ref 6.5–8.1)

## 2021-06-11 MED ORDER — PALONOSETRON HCL INJECTION 0.25 MG/5ML
0.2500 mg | Freq: Once | INTRAVENOUS | Status: AC
  Administered 2021-06-11: 0.25 mg via INTRAVENOUS
  Filled 2021-06-11: qty 5

## 2021-06-11 MED ORDER — SODIUM CHLORIDE 0.9 % IV SOLN
2400.0000 mg/m2 | INTRAVENOUS | Status: DC
  Administered 2021-06-11: 4300 mg via INTRAVENOUS
  Filled 2021-06-11: qty 86

## 2021-06-11 MED ORDER — SODIUM CHLORIDE 0.9 % IV SOLN
150.0000 mg | Freq: Once | INTRAVENOUS | Status: AC
  Administered 2021-06-11: 150 mg via INTRAVENOUS
  Filled 2021-06-11: qty 5

## 2021-06-11 MED ORDER — SODIUM CHLORIDE 0.9 % IV SOLN
10.0000 mg | Freq: Once | INTRAVENOUS | Status: AC
  Administered 2021-06-11: 10 mg via INTRAVENOUS
  Filled 2021-06-11: qty 1

## 2021-06-11 MED ORDER — ATROPINE SULFATE 1 MG/ML IV SOLN
0.5000 mg | Freq: Once | INTRAVENOUS | Status: AC | PRN
  Administered 2021-06-11: 0.5 mg via INTRAVENOUS
  Filled 2021-06-11: qty 1

## 2021-06-11 MED ORDER — SODIUM CHLORIDE 0.9 % IV SOLN
180.0000 mg/m2 | Freq: Once | INTRAVENOUS | Status: AC
  Administered 2021-06-11: 320 mg via INTRAVENOUS
  Filled 2021-06-11: qty 15

## 2021-06-11 MED ORDER — FLUOROURACIL CHEMO INJECTION 2.5 GM/50ML
400.0000 mg/m2 | Freq: Once | INTRAVENOUS | Status: AC
  Administered 2021-06-11: 700 mg via INTRAVENOUS
  Filled 2021-06-11: qty 14

## 2021-06-11 MED ORDER — SODIUM CHLORIDE 0.9 % IV SOLN
400.0000 mg/m2 | Freq: Once | INTRAVENOUS | Status: AC
  Administered 2021-06-11: 716 mg via INTRAVENOUS
  Filled 2021-06-11: qty 35.8

## 2021-06-11 MED ORDER — SODIUM CHLORIDE 0.9% FLUSH
10.0000 mL | INTRAVENOUS | Status: DC | PRN
  Administered 2021-06-11: 10 mL

## 2021-06-11 MED ORDER — SODIUM CHLORIDE 0.9 % IV SOLN: Freq: Once | INTRAVENOUS | Status: AC

## 2021-06-11 MED ORDER — ALTEPLASE 2 MG IJ SOLR: 2.0000 mg | Freq: Once | INTRAMUSCULAR | Status: DC | PRN

## 2021-06-11 NOTE — Progress Notes (Signed)
Provided 1 case of Ensure.

## 2021-06-11 NOTE — Patient Instructions (Addendum)
[place]  The chemotherapy medication bag should finish at 46 hours, 96 hours, or 7 days. For example, if your pump is scheduled for 46 hours and it was put on at 4:00 p.m., it should finish at 2:00 p.m. the day it is scheduled to come off regardless of your appointment time.     Estimated time to finish at 1:00 Thursday, June 13, 2021.   If the display on your pump reads "Low Volume" and it is beeping, take the batteries out of the pump and come to the cancer center for it to be taken off.   If the pump alarms go off prior to the pump reading "Low Volume" then call [phone] and someone can assist you.  If the plunger comes out and the chemotherapy medication is leaking out, please use your home chemo spill kit to clean up the spill. Do NOT use paper towels or other household products.  If you have problems or questions regarding your pump, please call either [phone] (24 hours a day) or the cancer center Monday-Friday 8:00 a.m.- 4:30 p.m. at the clinic number and we will assist you. If you are unable to get assistance, then go to the nearest Emergency Department and ask the staff to contact the IV team for assistance.   Discharge Instructions: Thank you for choosing [facility] to provide your oncology and hematology care.   If you have a lab appointment with the [facility], please go directly to the [facility] and check in at the registration area.   Wear comfortable clothing and clothing appropriate for easy access to any Portacath or PICC line.   We strive to give you quality time with your provider. You may need to reschedule your appointment if you arrive late (15 or more minutes).  Arriving late affects you and other patients whose appointments are after yours.  Also, if you miss three or more appointments without notifying the office, you may be dismissed from the clinic at the provider's discretion.      For prescription  refill requests, have your pharmacy contact our office and allow 72 hours for refills to be completed.    Today you received the following chemotherapy and/or immunotherapy agents Irinotecan, Leucovorin, Fluorouracil.      To help prevent nausea and vomiting after your treatment, we encourage you to take your nausea medication as directed.  BELOW ARE SYMPTOMS THAT SHOULD BE REPORTED IMMEDIATELY: *FEVER GREATER THAN 100.4 F (38 C) OR HIGHER *CHILLS OR SWEATING *NAUSEA AND VOMITING THAT IS NOT CONTROLLED WITH YOUR NAUSEA MEDICATION *UNUSUAL SHORTNESS OF BREATH *UNUSUAL BRUISING OR BLEEDING *URINARY PROBLEMS (pain or burning when urinating, or frequent urination) *BOWEL PROBLEMS (unusual diarrhea, constipation, pain near the anus) TENDERNESS IN MOUTH AND THROAT WITH OR WITHOUT PRESENCE OF ULCERS (sore throat, sores in mouth, or a toothache) UNUSUAL RASH, SWELLING OR PAIN  UNUSUAL VAGINAL DISCHARGE OR ITCHING   Items with * indicate a potential emergency and should be followed up as soon as possible or go to the Emergency Department if any problems should occur.  Please show the CHEMOTHERAPY ALERT CARD or IMMUNOTHERAPY ALERT CARD at check-in to the Emergency Department and triage nurse.  Should you have questions after your visit or need to cancel or reschedule your appointment, please contact [facility]  Dept: [phone]  and follow the prompts.  Office hours are 8:00 a.m. to 4:30 p.m. Monday - Friday. Please note that voicemails  left after 4:00 p.m. may not be returned until the following business day.  We are closed weekends and major holidays. You have access to a nurse at all times for urgent questions. Please call the main number to the clinic Dept: [phone] and follow the prompts.   For any non-urgent questions, you may also contact your provider using MyChart. We now offer e-Visits for anyone 59 and older to request care online for non-urgent symptoms.  For details visit [email or web address].   Also download the MyChart app! Go to the app store, search "MyChart", open the app, select [facility], and log in with your MyChart username and password.  Due to Covid, a mask is required upon entering the hospital/clinic. If you do not have a mask, one will be given to you upon arrival. For doctor visits, patients may have 1 support person aged 52 or older with them. For treatment visits, patients cannot have anyone with them due to current Covid guidelines and our immunocompromised population.   Irinotecan injection What is this medication? IRINOTECAN (ir in oh TEE kan ) is a chemotherapy drug. It is used to treat colon and rectal cancer. This medicine may be used for other purposes; ask your health care provider or pharmacist if you have questions. COMMON BRAND NAME(S): Camptosar What should I tell my care team before I take this medication? They need to know if you have any of these conditions: dehydration diarrhea infection (especially a virus infection such as chickenpox, cold sores, or herpes) liver disease low blood counts, like low white cell, platelet, or red cell counts low levels of calcium, magnesium, or potassium in the blood recent or ongoing radiation therapy an unusual or allergic reaction to irinotecan, other medicines, foods, dyes, or preservatives pregnant or trying to get pregnant breast-feeding How should I use this medication? This drug is given as an infusion into a vein. It is administered in a hospital or clinic by a specially trained health care professional. Talk to your pediatrician regarding the use of this medicine in children. Special care may be needed. Overdosage: If you think you have taken too much of this medicine contact a poison control center or emergency room at once. NOTE: This medicine is only for you. Do not share this medicine with others. What if I miss a dose? It is important not to miss your  dose. Call your doctor or health care professional if you are unable to keep an appointment. What may interact with this medication? Do not take this medicine with any of the following medications: cobicistat itraconazole This medicine may interact with the following medications: antiviral medicines for HIV or AIDS certain antibiotics like rifampin or rifabutin certain medicines for fungal infections like ketoconazole, posaconazole, and voriconazole certain medicines for seizures like carbamazepine, phenobarbital, phenotoin clarithromycin gemfibrozil nefazodone [facility]. John's Wort This list may not describe all possible interactions. Give your health care provider a list of all the medicines, herbs, non-prescription drugs, or dietary supplements you use. Also tell them if you smoke, drink alcohol, or use illegal drugs. Some items may interact with your medicine. What should I watch for while using this medication? Your condition will be monitored carefully while you are receiving this medicine. You will need important blood work done while you are taking this medicine. This drug may make you feel generally unwell. This is not uncommon, as chemotherapy can affect healthy cells as well as cancer cells. Report any side effects. Continue your course of treatment even though you  feel ill unless your doctor tells you to stop. In some cases, you may be given additional medicines to help with side effects. Follow all directions for their use. You may get drowsy or dizzy. Do not drive, use machinery, or do anything that needs mental alertness until you know how this medicine affects you. Do not stand or sit up quickly, especially if you are an older patient. This reduces the risk of dizzy or fainting spells. Call your health care professional for advice if you get a fever, chills, or sore throat, or other symptoms of a cold or flu. Do not treat yourself. This medicine decreases your body's ability to fight  infections. Try to avoid being around people who are sick. Avoid taking products that contain aspirin, acetaminophen, ibuprofen, naproxen, or ketoprofen unless instructed by your doctor. These medicines may hide a fever. This medicine may increase your risk to bruise or bleed. Call your doctor or health care professional if you notice any unusual bleeding. Be careful brushing and flossing your teeth or using a toothpick because you may get an infection or bleed more easily. If you have any dental work done, tell your dentist you are receiving this medicine. Do not become pregnant while taking this medicine or for 6 months after stopping it. Women should inform their health care professional if they wish to become pregnant or think they might be pregnant. Men should not father a child while taking this medicine and for 3 months after stopping it. There is potential for serious side effects to an unborn child. Talk to your health care professional for more information. Do not breast-feed an infant while taking this medicine or for 7 days after stopping it. This medicine has caused ovarian failure in some women. This medicine may make it more difficult to get pregnant. Talk to your health care professional if you are concerned about your fertility. This medicine has caused decreased sperm counts in some men. This may make it more difficult to father a child. Talk to your health care professional if you are concerned about your fertility. What side effects may I notice from receiving this medication? Side effects that you should report to your doctor or health care professional as soon as possible: allergic reactions like skin rash, itching or hives, swelling of the face, lips, or tongue chest pain diarrhea flushing, runny nose, sweating during infusion low blood counts - this medicine may decrease the number of white blood cells, red blood cells and platelets. You may be at increased risk for infections  and bleeding. nausea, vomiting pain, swelling, warmth in the leg signs of decreased platelets or bleeding - bruising, pinpoint red spots on the skin, black, tarry stools, blood in the urine signs of infection - fever or chills, cough, sore throat, pain or difficulty passing urine signs of decreased red blood cells - unusually weak or tired, fainting spells, lightheadedness Side effects that usually do not require medical attention (report to your doctor or health care professional if they continue or are bothersome): constipation hair loss headache loss of appetite mouth sores stomach pain This list may not describe all possible side effects. Call your doctor for medical advice about side effects. You may report side effects to FDA at 1-800-FDA-1088. Where should I keep my medication? This drug is given in a hospital or clinic and will not be stored at home. NOTE: This sheet is a summary. It may not cover all possible information. If you have questions about this medicine,  talk to your doctor, pharmacist, or health care provider.  2023 Elsevier/Gold Standard (2020-11-30 00:00:00)  Leucovorin injection What is this medication? LEUCOVORIN (loo koe VOR in) is used to prevent or treat the harmful effects of some medicines. This medicine is used to treat anemia caused by a low amount of folic acid in the body. It is also used with 5-fluorouracil (5-FU) to treat colon cancer. This medicine may be used for other purposes; ask your health care provider or pharmacist if you have questions. What should I tell my care team before I take this medication? They need to know if you have any of these conditions: anemia from low levels of vitamin B-12 in the blood an unusual or allergic reaction to leucovorin, folic acid, other medicines, foods, dyes, or preservatives pregnant or trying to get pregnant breast-feeding How should I use this medication? This medicine is for injection into a muscle or into  a vein. It is given by a health care professional in a hospital or clinic setting. Talk to your pediatrician regarding the use of this medicine in children. Special care may be needed. Overdosage: If you think you have taken too much of this medicine contact a poison control center or emergency room at once. NOTE: This medicine is only for you. Do not share this medicine with others. What if I miss a dose? This does not apply. What may interact with this medication? capecitabine fluorouracil phenobarbital phenytoin primidone trimethoprim-sulfamethoxazole This list may not describe all possible interactions. Give your health care provider a list of all the medicines, herbs, non-prescription drugs, or dietary supplements you use. Also tell them if you smoke, drink alcohol, or use illegal drugs. Some items may interact with your medicine. What should I watch for while using this medication? Your condition will be monitored carefully while you are receiving this medicine. This medicine may increase the side effects of 5-fluorouracil, 5-FU. Tell your doctor or health care professional if you have diarrhea or mouth sores that do not get better or that get worse. What side effects may I notice from receiving this medication? Side effects that you should report to your doctor or health care professional as soon as possible: allergic reactions like skin rash, itching or hives, swelling of the face, lips, or tongue breathing problems fever, infection mouth sores unusual bleeding or bruising unusually weak or tired Side effects that usually do not require medical attention (report to your doctor or health care professional if they continue or are bothersome): constipation or diarrhea loss of appetite nausea, vomiting This list may not describe all possible side effects. Call your doctor for medical advice about side effects. You may report side effects to FDA at 1-800-FDA-1088. Where should I keep  my medication? This drug is given in a hospital or clinic and will not be stored at home. NOTE: This sheet is a summary. It may not cover all possible information. If you have questions about this medicine, talk to your doctor, pharmacist, or health care provider.  2023 Elsevier/Gold Standard (2007-07-08 00:00:00)  Fluorouracil, 5-FU injection What is this medication? FLUOROURACIL, 5-FU (flure oh YOOR a sil) is a chemotherapy drug. It slows the growth of cancer cells. This medicine is used to treat many types of cancer like breast cancer, colon or rectal cancer, pancreatic cancer, and stomach cancer. This medicine may be used for other purposes; ask your health care provider or pharmacist if you have questions. COMMON BRAND NAME(S): Adrucil What should I tell my care team  before I take this medication? They need to know if you have any of these conditions: blood disorders dihydropyrimidine dehydrogenase (DPD) deficiency infection (especially a virus infection such as chickenpox, cold sores, or herpes) kidney disease liver disease malnourished, poor nutrition recent or ongoing radiation therapy an unusual or allergic reaction to fluorouracil, other chemotherapy, other medicines, foods, dyes, or preservatives pregnant or trying to get pregnant breast-feeding How should I use this medication? This drug is given as an infusion or injection into a vein. It is administered in a hospital or clinic by a specially trained health care professional. Talk to your pediatrician regarding the use of this medicine in children. Special care may be needed. Overdosage: If you think you have taken too much of this medicine contact a poison control center or emergency room at once. NOTE: This medicine is only for you. Do not share this medicine with others. What if I miss a dose? It is important not to miss your dose. Call your doctor or health care professional if you are unable to keep an  appointment. What may interact with this medication? Do not take this medicine with any of the following medications: live virus vaccines This medicine may also interact with the following medications: medicines that treat or prevent blood clots like warfarin, enoxaparin, and dalteparin This list may not describe all possible interactions. Give your health care provider a list of all the medicines, herbs, non-prescription drugs, or dietary supplements you use. Also tell them if you smoke, drink alcohol, or use illegal drugs. Some items may interact with your medicine. What should I watch for while using this medication? Visit your doctor for checks on your progress. This drug may make you feel generally unwell. This is not uncommon, as chemotherapy can affect healthy cells as well as cancer cells. Report any side effects. Continue your course of treatment even though you feel ill unless your doctor tells you to stop. In some cases, you may be given additional medicines to help with side effects. Follow all directions for their use. Call your doctor or health care professional for advice if you get a fever, chills or sore throat, or other symptoms of a cold or flu. Do not treat yourself. This drug decreases your body's ability to fight infections. Try to avoid being around people who are sick. This medicine may increase your risk to bruise or bleed. Call your doctor or health care professional if you notice any unusual bleeding. Be careful brushing and flossing your teeth or using a toothpick because you may get an infection or bleed more easily. If you have any dental work done, tell your dentist you are receiving this medicine. Avoid taking products that contain aspirin, acetaminophen, ibuprofen, naproxen, or ketoprofen unless instructed by your doctor. These medicines may hide a fever. Do not become pregnant while taking this medicine. Women should inform their doctor if they wish to become pregnant  or think they might be pregnant. There is a potential for serious side effects to an unborn child. Talk to your health care professional or pharmacist for more information. Do not breast-feed an infant while taking this medicine. Men should inform their doctor if they wish to father a child. This medicine may lower sperm counts. Do not treat diarrhea with over the counter products. Contact your doctor if you have diarrhea that lasts more than 2 days or if it is severe and watery. This medicine can make you more sensitive to the sun. Keep out of the  sun. If you cannot avoid being in the sun, wear protective clothing and use sunscreen. Do not use sun lamps or tanning beds/booths. What side effects may I notice from receiving this medication? Side effects that you should report to your doctor or health care professional as soon as possible: allergic reactions like skin rash, itching or hives, swelling of the face, lips, or tongue low blood counts - this medicine may decrease the number of white blood cells, red blood cells and platelets. You may be at increased risk for infections and bleeding. signs of infection - fever or chills, cough, sore throat, pain or difficulty passing urine signs of decreased platelets or bleeding - bruising, pinpoint red spots on the skin, black, tarry stools, blood in the urine signs of decreased red blood cells - unusually weak or tired, fainting spells, lightheadedness breathing problems changes in vision chest pain mouth sores nausea and vomiting pain, swelling, redness at site where injected pain, tingling, numbness in the hands or feet redness, swelling, or sores on hands or feet stomach pain unusual bleeding Side effects that usually do not require medical attention (report to your doctor or health care professional if they continue or are bothersome): changes in finger or toe nails diarrhea dry or itchy skin hair loss headache loss of appetite sensitivity  of eyes to the light stomach upset unusually teary eyes This list may not describe all possible side effects. Call your doctor for medical advice about side effects. You may report side effects to FDA at 1-800-FDA-1088. Where should I keep my medication? This drug is given in a hospital or clinic and will not be stored at home. NOTE: This sheet is a summary. It may not cover all possible information. If you have questions about this medicine, talk to your doctor, pharmacist, or health care provider.  2023 Elsevier/Gold Standard (2020-11-30 00:00:00)

## 2021-06-11 NOTE — Progress Notes (Signed)
Patient seen by Ned Card NP today  Vitals are within treatment parameters.  Labs reviewed by Ned Card NP and are within treatment parameters. Verbal ANC 8.23 per lab  Per physician team, patient is ready for treatment and there are NO modifications to the treatment plan.

## 2021-06-11 NOTE — Progress Notes (Signed)
[name] at chairside to sign for patient.Alexander Reeves

## 2021-06-11 NOTE — Progress Notes (Signed)
Alexander Reeves OFFICE PROGRESS NOTE   Diagnosis: Colon cancer  INTERVAL HISTORY:   Alexander Reeves returns as scheduled.  He completed a cycle of FOLFIRI 05/28/2021.  He denies nausea/vomiting.  No mouth sores.  2 to 3 days after the chemotherapy he developed "runny" stool, may be for a day.  He did not try Imodium.  He continues to have intermittent reflux symptoms.  He would like to go to a dentist for cleaning and to evaluate a possible "cracked" tooth.  Objective:  Vital signs in last 24 hours:  Blood pressure (!) 167/84, pulse 97, temperature 98.2 F (36.8 C), temperature source Oral, resp. rate 18, height '5\' 10"'  (1.778 m), weight 150 lb 6.4 oz (68.2 kg), SpO2 96 %.    HEENT: No thrush or ulcers. Resp: Lungs clear bilaterally.  Decreased breath sounds at the right lower lung field.  No respiratory distress Cardio: Regular rate and rhythm. GI: Abdomen soft and nontender.  No hepatomegaly. Vascular: No leg edema. Port-A-Cath without erythema  Lab Results:  Lab Results  Component Value Date   WBC 12.5 (H) 06/11/2021   HGB 12.6 (L) 06/11/2021   HCT 41.4 06/11/2021   MCV 79.6 (L) 06/11/2021   PLT 182 06/11/2021   NEUTROABS PENDING 06/11/2021    Imaging:  No results found.  Medications: I have reviewed the patient's current medications.  Assessment/Plan: Moderately differentiated adenocarcinoma ascending colon, stage IIIb (pT3pN1), status post a right colectomy 11/19/2018 Lymphovascular and perineural invasion present, 2/14 lymph nodes positive, tumor deposits present Positive radial margin, no loss of mismatch repair protein expression, discussed case with Dr. Craig Reeves mass with surrounding inflammation at multiple margins, no gross residual disease, unclear which is the "positive "radial margin, further surgery and radiation not recommended Colonoscopy 11/19/2018-completely obstructing mid ascending colon mass, could not be passed with endoscope, biopsy  confirmed invasive adenocarcinoma CT abdomen/pelvis 11/18/2018-wall thickening at the mid and distal ascending colon with mild distention of the proximal ascending colon and cecum CTs 10/17/2018--no acute findings, no chest lymphadenopathy, lungs clear Cycle 1 FOLFOX 12/28/2018 Cycle 2 FOLFOX 01/11/2019, Udenyca added  Cycle 3 FOLFOX 01/25/2019, Udenyca held due to bone pain Cycle 4 FOLFOX 02/21/2019, Udenyca added Cycle 5 FOLFOX 03/09/2019, Udenyca Cycle 6 FOLFOX 03/21/2019, Udenyca Cycle 7 FOLFOX 04/04/2019, Udenyca Cycle 8 FOLFOX 04/18/2019, Udenyca  Cycle 9 FOLFOX 05/02/2019, Udenyca Cycle 10 FOLFOX 05/16/2019, oxaliplatin, 5-FU bolus, and Udenyca held Cycle 11 FOLFOX 05/30/2019, oxaliplatin, 5-FU bolus, and Udenyca held Cycle 12 FOLFOX 06/15/2019, oxaliplatin, 5-FU bolus and Udenyca held CTs 09/29/2019-new hypodense enhancing liver masses consistent with metastatic disease, indistinct marginated nodularity below the pancreas head-likely small lymph nodes Biopsy liver lesion 10/18/2019-adenocarcinoma consistent with history of colorectal carcinoma Cycle 1 FOLFIRI/Avastin 11/01/2019 Cycle 2 FOLFIRI/Avastin 11/15/2019 Cycle 3 FOLFIRI/Avastin 11/29/2019 Cycle 4 FOLFIRI/Avastin 12/13/2019 CT chest 12/27/2019-no significant change in hepatic metastases, submassive pulmonary emboli Cycle 5 FOLFIRI 01/10/2020 (Avastin discontinued) Cycle 6 FOLFIRI 02/01/2020 Cycle 7 FOLFIRI 02/13/2020 Cycle 8 FOLFIRI 02/27/2020 CTs abdomen/pelvis 03/09/2020-per Dr. Gearldine Reeves review overall stable disease Cycle 9 FOLFIRI 04/16/2020 Cycle 10 FOLFIRI 05/01/2020 Cycle 11 FOLFIRI 05/15/2020 Cycle 12 FOLFIRI 05/29/2020 CT abdomen/pelvis 06/07/2020- decreased size of liver lesions, no new lesions Cycle 13 FOLFIRI 06/12/2020 Cycle 14 FOLFIRI 07/10/2020 Cycle 15 FOLFIRI 07/24/2020 Cycle 16 FOLFIRI 08/07/2020 Cycle 17 FOLFIRI 08/21/2020 Cycle 18 FOLFIRI 09/04/2020 CT abdomen/pelvis 09/14/2020-stable faint liver lesions, no new lesions, no new  site of metastatic disease Cycle 19 09/18/2020  Treatment held 10/09/2020 due to upper respiratory symptoms, known COVID exposure Cycle  20 FOLFIRI 10/30/2020 Cycle 21 FOLFIRI 11/20/2020 Cycle 22 FOLFIRI 12/12/2020 Cycle 23 FOLFIRI 01/01/2021, Emend added CTs 01/22/2021-faintly seen residual of prior hepatic metastatic disease seen at 2 sites, no progression.  No new findings of metastatic disease. FOLFIRI placed on hold, referred for liver MRI MRI abdomen 01/29/2021-no enhancing hepatic lesions, 9 mm hyperintense T2 signal lesion near the dome of the right hepatic lobe and 7 mm hyperintense T2 signal lesion in the inferior right hepatic lobe segment 6, no other evidence of metastatic disease Case presented at GI tumor conference 02/27/2021-not a candidate for hepatic resection, observation recommended, repeat MRI at a 24-monthinterval 05/05/2021 MRI liver-progressive multifocal hepatic metastases.  Previously there were only 2 lesions measuring up to 10 mm.  On current study there are at least 8 lesions in both lobes.  New 18 mm portacaval node.  New left para-aortic node. Cycle 24 FOLFIRI 05/14/2021 Cycle 25 FOLFIRI 05/28/2021 Cycle 26 FOLFIRI 06/11/2021   Deaf Right epididymal cyst removal 03/22/2018 Asthma Port-A-Cath placement, Alexander Reeves 12/23/2018 Neutropenia secondary to chemotherapy-Udenyca added for cycle 2 FOLFOX Admission with febrile neutropenia 02/08/2019 Hospital admission with submassive PE and left lower extremity DVT on 12/27/2019, heparin, transition to Xarelto 12/28/2019 CT chest 12/27/2019-submassive pulmonary emboli with evidence of right heart strain, no significant change in hepatic metastases       Disposition: Mr. JKeilmanappears stable.  He has completed 2 cycles of FOLFIRI since resuming treatment.  Overall he is tolerating well.  Plan to proceed with FOLFIRI today as scheduled.  He had mild diarrhea after cycle 2.  He will try Imodium.  He will contact the office with  poorly controlled diarrhea.  He will return for lab, follow-up, FOLFIRI in 2 weeks.  He will contact the office as outlined above or with any other problems.    Alexander Reeves   06/11/2021  10:07 AM

## 2021-06-12 ENCOUNTER — Telehealth (INDEPENDENT_AMBULATORY_CARE_PROVIDER_SITE_OTHER): Payer: Self-pay | Admitting: Primary Care

## 2021-06-12 NOTE — Telephone Encounter (Signed)
Pt backup number to call is [phone]   Medication Refill - Medication: albuterol (VENTOLIN HFA) 108 (90 Base) MCG/ACT inhaler Pt states this one was too small and he is out already / he asked if he can get the big blue one/ or the larger inhaler / please advise   Has the patient contacted their pharmacy? No. (Agent: If no, request that the patient contact the pharmacy for the refill. If patient does not wish to contact the pharmacy document the reason why and proceed with request.) (Agent: If yes, when and what did the pharmacy advise?)  Preferred Pharmacy (with phone number or street name): [place] ([place]), [place] - [address]  [address], [place] [address]  Phone:  [phone]  Fax:  [phone]  Has the patient been seen for an appointment in the last year OR does the patient have an upcoming appointment? Yes.    Agent: Please be advised that RX refills may take up to 3 business days. We ask that you follow-up with your pharmacy.

## 2021-06-12 NOTE — Telephone Encounter (Signed)
Routed to PCP 

## 2021-06-13 ENCOUNTER — Other Ambulatory Visit (INDEPENDENT_AMBULATORY_CARE_PROVIDER_SITE_OTHER): Payer: Self-pay | Admitting: Primary Care

## 2021-06-13 ENCOUNTER — Inpatient Hospital Stay: Payer: Medicare Other | Attending: Nurse Practitioner

## 2021-06-13 VITALS — BP 133/85 | HR 70 | Temp 98.6°F | Resp 16

## 2021-06-13 DIAGNOSIS — C779 Secondary and unspecified malignant neoplasm of lymph node, unspecified: Secondary | ICD-10-CM | POA: Insufficient documentation

## 2021-06-13 DIAGNOSIS — D701 Agranulocytosis secondary to cancer chemotherapy: Secondary | ICD-10-CM | POA: Diagnosis not present

## 2021-06-13 DIAGNOSIS — C787 Secondary malignant neoplasm of liver and intrahepatic bile duct: Secondary | ICD-10-CM | POA: Diagnosis not present

## 2021-06-13 DIAGNOSIS — C182 Malignant neoplasm of ascending colon: Secondary | ICD-10-CM | POA: Diagnosis present

## 2021-06-13 DIAGNOSIS — J4520 Mild intermittent asthma, uncomplicated: Secondary | ICD-10-CM

## 2021-06-13 DIAGNOSIS — C189 Malignant neoplasm of colon, unspecified: Secondary | ICD-10-CM

## 2021-06-13 DIAGNOSIS — Z5111 Encounter for antineoplastic chemotherapy: Secondary | ICD-10-CM | POA: Insufficient documentation

## 2021-06-13 MED ORDER — SODIUM CHLORIDE 0.9% FLUSH
10.0000 mL | INTRAVENOUS | Status: DC | PRN
  Administered 2021-06-13: 10 mL

## 2021-06-13 MED ORDER — PEGFILGRASTIM-BMEZ 6 MG/0.6ML ~~LOC~~ SOSY
6.0000 mg | PREFILLED_SYRINGE | Freq: Once | SUBCUTANEOUS | Status: AC
  Administered 2021-06-13: 6 mg via SUBCUTANEOUS
  Filled 2021-06-13: qty 0.6

## 2021-06-13 MED ORDER — HEPARIN SOD (PORK) LOCK FLUSH 100 UNIT/ML IV SOLN
500.0000 [IU] | Freq: Once | INTRAVENOUS | Status: AC | PRN
  Administered 2021-06-13: 500 [IU]

## 2021-06-13 NOTE — Patient Instructions (Signed)
Implanted Port Home Guide An implanted port is a device that is placed under the skin. It is usually placed in the chest. The device may vary based on the need. Implanted ports can be used to give IV medicine, to take blood, or to give fluids. You may have an implanted port if: You need IV medicine that would be irritating to the small veins in your hands or arms. You need IV medicines, such as chemotherapy, for a long period of time. You need IV nutrition for a long period of time. You may have fewer limitations when using a port than you would if you used other types of long-term IVs. You will also likely be able to return to normal activities after your incision heals. An implanted port has two main parts: Reservoir. The reservoir is the part where a needle is inserted to give medicines or draw blood. The reservoir is round. After the port is placed, it appears as a small, raised area under your skin. Catheter. The catheter is a small, thin tube that connects the reservoir to a vein. Medicine that is inserted into the reservoir goes into the catheter and then into the vein. How is my port accessed? To access your port: A numbing cream may be placed on the skin over the port site. Your health care provider will put on a mask and sterile gloves. The skin over your port will be cleaned carefully with a germ-killing soap and allowed to dry. Your health care provider will gently pinch the port and insert a needle into it. Your health care provider will check for a blood return to make sure the port is in the vein and is still working (patent). If your port needs to remain accessed to get medicine continuously (constant infusion), your health care provider will place a clear bandage (dressing) over the needle site. The dressing and needle will need to be changed every week, or as told by your health care provider. What is flushing? Flushing helps keep the port working. Follow instructions from your  health care provider about how and when to flush the port. Ports are usually flushed with saline solution or a medicine called heparin. The need for flushing will depend on how the port is used: If the port is only used from time to time to give medicines or draw blood, the port may need to be flushed: Before and after medicines have been given. Before and after blood has been drawn. As part of routine maintenance. Flushing may be recommended every 4-6 weeks. If a constant infusion is running, the port may not need to be flushed. Throw away any syringes in a disposal container that is meant for sharp items (sharps container). You can buy a sharps container from a pharmacy, or you can make one by using an empty hard plastic bottle with a cover. How long will my port stay implanted? The port can stay in for as long as your health care provider thinks it is needed. When it is time for the port to come out, a surgery will be done to remove it. The surgery will be similar to the procedure that was done to put the port in. Follow these instructions at home: Caring for your port and port site Flush your port as told by your health care provider. If you need an infusion over several days, follow instructions from your health care provider about how to take care of your port site. Make sure you: Change your   dressing as told by your health care provider. Wash your hands with soap and water for at least 20 seconds before and after you change your dressing. If soap and water are not available, use alcohol-based hand sanitizer. Place any used dressings or infusion bags into a plastic bag. Throw that bag in the trash. Keep the dressing that covers the needle clean and dry. Do not get it wet. Do not use scissors or sharp objects near the infusion tubing. Keep any external tubes clamped, unless they are being used. Check your port site every day for signs of infection. Check for: Redness, swelling, or  pain. Fluid or blood. Warmth. Pus or a bad smell. Protect the skin around the port site. Avoid wearing bra straps that rub or irritate the site. Protect the skin around your port from seat belts. Place a soft pad over your chest if needed. Bathe or shower as told by your health care provider. The site may get wet as long as you are not actively receiving an infusion. General instructions  Return to your normal activities as told by your health care provider. Ask your health care provider what activities are safe for you. Carry a medical alert card or wear a medical alert bracelet at all times. This will let health care providers know that you have an implanted port in case of an emergency. Where to find more information [name] Cancer Society: www.cancer.[place] of Clinical Oncology: www.cancer.net Contact a health care provider if: You have a fever or chills. You have redness, swelling, or pain at the port site. You have fluid or blood coming from your port site. Your incision feels warm to the touch. You have pus or a bad smell coming from the port site. Summary Implanted ports are usually placed in the chest for long-term IV access. Follow instructions from your health care provider about flushing the port and changing bandages (dressings). Take care of the area around your port by avoiding clothing that puts pressure on the area, and by watching for signs of infection. Protect the skin around your port from seat belts. Place a soft pad over your chest if needed. Contact a health care provider if you have a fever or you have redness, swelling, pain, fluid, or a bad smell at the port site. This information is not intended to replace advice given to you by your health care provider. Make sure you discuss any questions you have with your health care provider. Pegfilgrastim Injection What is this medication? PEGFILGRASTIM (PEG fil gra stim) lowers the risk of infection in  people who are receiving chemotherapy. It works by Building control surveyor make more white blood cells, which protects your body from infection. It may also be used to help people who have been exposed to high doses of radiation. This medicine may be used for other purposes; ask your health care provider or pharmacist if you have questions. COMMON BRAND NAME(S): [place] [employer], Neulasta, Nyvepria, Stimufend, UDENYCA, Ziextenzo What should I tell my care team before I take this medication? They need to know if you have any of these conditions: Kidney disease Latex allergy Ongoing radiation therapy Sickle cell disease Skin reactions to acrylic adhesives (On-Body Injector only) An unusual or allergic reaction to pegfilgrastim, filgrastim, other medications, foods, dyes, or preservatives Pregnant or trying to get pregnant Breast-feeding How should I use this medication? This medication is for injection under the skin. If you get this medication at home, you will be taught how to  prepare and give the pre-filled syringe or how to use the On-body Injector. Refer to the patient Instructions for Use for detailed instructions. Use exactly as directed. Tell your care team immediately if you suspect that the On-body Injector may not have performed as intended or if you suspect the use of the On-body Injector resulted in a missed or partial dose. It is important that you put your used needles and syringes in a special sharps container. Do not put them in a trash can. If you do not have a sharps container, call your pharmacist or care team to get one. Talk to your care team about the use of this medication in children. While this medication may be prescribed for selected conditions, precautions do apply. Overdosage: If you think you have taken too much of this medicine contact a poison control center or emergency room at once. NOTE: This medicine is only for you. Do not share this medicine with others. What if I  miss a dose? It is important not to miss your dose. Call your care team if you miss your dose. If you miss a dose due to an On-body Injector failure or leakage, a new dose should be administered as soon as possible using a single prefilled syringe for manual use. What may interact with this medication? Interactions have not been studied. This list may not describe all possible interactions. Give your health care provider a list of all the medicines, herbs, non-prescription drugs, or dietary supplements you use. Also tell them if you smoke, drink alcohol, or use illegal drugs. Some items may interact with your medicine. What should I watch for while using this medication? Your condition will be monitored carefully while you are receiving this medication. You may need blood work done while you are taking this medication. Talk to your care team about your risk of cancer. You may be more at risk for certain types of cancer if you take this medication. If you are going to need a MRI, CT scan, or other procedure, tell your care team that you are using this medication (On-Body Injector only). What side effects may I notice from receiving this medication? Side effects that you should report to your care team as soon as possible: Allergic reactions--skin rash, itching, hives, swelling of the face, lips, tongue, or throat Capillary leak syndrome--stomach or muscle pain, unusual weakness or fatigue, feeling faint or lightheaded, decrease in the amount of urine, swelling of the ankles, hands, or feet, trouble breathing High white blood cell level--fever, fatigue, trouble breathing, night sweats, change in vision, weight loss Inflammation of the aorta--fever, fatigue, back, chest, or stomach pain, severe headache Kidney injury (glomerulonephritis)--decrease in the amount of urine, red or dark brown urine, foamy or bubbly urine, swelling of the ankles, hands, or feet Shortness of breath or trouble  breathing Spleen injury--pain in upper left stomach or shoulder Unusual bruising or bleeding Side effects that usually do not require medical attention (report to your care team if they continue or are bothersome): Bone pain Pain in the hands or feet This list may not describe all possible side effects. Call your doctor for medical advice about side effects. You may report side effects to FDA at 1-800-FDA-1088. Where should I keep my medication? Keep out of the reach of children. If you are using this medication at home, you will be instructed on how to store it. Throw away any unused medication after the expiration date on the label. NOTE: This sheet is a summary.  It may not cover all possible information. If you have questions about this medicine, talk to your doctor, pharmacist, or health care provider.  2023 Elsevier/Gold Standard (2020-11-30 00:00:00)  Document Revised: 07/03/2020 Document Reviewed: 07/03/2020 Elsevier Patient Education  [place].

## 2021-06-13 NOTE — Telephone Encounter (Signed)
Requested routed to PCP. Please also see separate phone encounter regarding patients inhaler.

## 2021-06-18 ENCOUNTER — Inpatient Hospital Stay: Payer: Medicare Other | Admitting: Licensed Clinical Social Worker

## 2021-06-18 ENCOUNTER — Other Ambulatory Visit: Payer: Self-pay | Admitting: Nurse Practitioner

## 2021-06-18 ENCOUNTER — Telehealth: Payer: Self-pay

## 2021-06-18 ENCOUNTER — Other Ambulatory Visit: Payer: Self-pay

## 2021-06-18 DIAGNOSIS — C182 Malignant neoplasm of ascending colon: Secondary | ICD-10-CM

## 2021-06-18 MED ORDER — SCOPOLAMINE 1 MG/3DAYS TD PT72: 1.0000 | MEDICATED_PATCH | TRANSDERMAL | 0 refills | Status: DC

## 2021-06-18 NOTE — Telephone Encounter (Signed)
Patient call in and stated he's drowning in saliva.I spoke with the NP about the patient issue, and the provider call in scopolamine. I called the patient to let him know scopolamine was send in and explain how to apply the patch. Patient gave verbal understanding and had no concerns or questions.

## 2021-06-18 NOTE — Progress Notes (Signed)
[facility] CSW Progress Note  Clinical Education officer, museum contacted patient by phone to discuss housing concerns.  CSW spoke with patient via telephonic interpreter services for the deaf and hard of hearing. Patient stated he is concerned about housing.  He currently is living with his daughter and neither of them are working and they have not paid rent because he is trying to save up money to afford a deposit and first months rent on another home.  Patient stated the house he is living in was the house his late wife lived and rented but the landlord has allowed hi to live rent free for now. Patient stated he is currently working with Keenan Bachelor w/ [employer] [facility] [place] [phone] (office), [phone] (video phone) to find affordable housing.  CSW explained to patient housing assistance options ar very limited and he would need to contact [employer] and apply for public housing.  CSW explained the average wait time is at least 6 months and sometimes longer.  Patient stated he had applied to the [facility] and he did not hear back from them.  CSW stated I would contact [facility] and find out how they notified patient's who are deaf/hard of hearing.  CSW asked patient what other options he has for housing if he needs to leave his current home, but does not have housing lined up, or is on a waiting list for [place] [facility].  Patient stated he did not know.  CSW suggested patient staying with his mother in [place] but patient was adamant he did not want to leave his daughter, who he lives with and did not want to change doctors and possibly lose good medical care.  CSW stated that I understood his concerns and recommended the patient have a plan in case he needs housing before he has a reply from either his case manager or [place].  Patient stated he has friends that would be able to help.   CSW contact [place] [phone], and was told the patient has an active application from October 18th,  2019, but the main applicant is Burney Gauze which is the patient's deceased wife.  CSW explained this to the representative and she gave me the name of a supervisor Blair Dolphin.  The representative stated they send mail to the applicants primary address on the application when they applicant is deaf or heard of hearing. CSW left voice mail for Ms. Davis with contact information and request for a return call. CSW called the patient and updated hi on the information the [facility] [facility] representative gave me and let him know I left a voicemail for Ms. Davis.  Patient gave CSW the contact information for Keenan Bachelor, caseworker.  CSW called and left voicemail for Ms. Moore with contact information and requesting a return call.  CSW told patient I would update him as soon as I heard back from Ms. Rosana Hoes and if she did not contact me in 48 hours, I would let him know. Patient stated he understood.    Adelene Amas, LCSW

## 2021-06-21 ENCOUNTER — Other Ambulatory Visit (INDEPENDENT_AMBULATORY_CARE_PROVIDER_SITE_OTHER): Payer: Self-pay | Admitting: Primary Care

## 2021-06-21 DIAGNOSIS — J4520 Mild intermittent asthma, uncomplicated: Secondary | ICD-10-CM

## 2021-06-21 MED ORDER — ALBUTEROL SULFATE HFA 108 (90 BASE) MCG/ACT IN AERS: INHALATION_SPRAY | RESPIRATORY_TRACT | 2 refills | Status: DC

## 2021-06-21 NOTE — Telephone Encounter (Signed)
Medication Refill - Medication:   albuterol (VENTOLIN HFA) 108 (90 Base) MCG/ACT inhaler  albuterol (PROVENTIL HFA;VENTOLIN HFA) 108 (90 Base) MCG/ACT inhaler 2 puff   *Needing a two month supply-going out of town*  Has the patient contacted their pharmacy? Yes.   Contact PCP  Preferred Pharmacy (with phone number or street name):  [place] [facility] ([place]), [place] - [address] Phone:  [phone]  Fax:  [phone]      Has the patient been seen for an appointment in the last year OR does the patient have an upcoming appointment? Yes.    Agent: Please be advised that RX refills may take up to 3 business days. We ask that you follow-up with your pharmacy.

## 2021-06-21 NOTE — Telephone Encounter (Signed)
Requested Prescriptions  Pending Prescriptions Disp Refills  . albuterol (VENTOLIN HFA) 108 (90 Base) MCG/ACT inhaler 9 g 2    Sig: INHALE 1 TO 2 PUFFS BY MOUTH EVERY 4 TO 6 HOURS AS NEEDED FOR ASTHMA     Pulmonology:  Beta Agonists 2 Passed - 06/21/2021  3:42 PM      Passed - Last BP in normal range    BP Readings from Last 1 Encounters:  06/13/21 133/85         Passed - Last Heart Rate in normal range    Pulse Readings from Last 1 Encounters:  06/13/21 70         Passed - Valid encounter within last 12 months    Recent Outpatient Visits          8 months ago Arthralgia, unspecified joint   [facility] [name], NP   1 year ago Nonspeaking deaf   [facility] [name], NP   1 year ago Nonspeaking deaf   [facility] [name], NP   1 year ago Arthralgia, unspecified joint   [facility] [name], NP   2 years ago Annual physical exam   [facility] [name], NP

## 2021-06-22 ENCOUNTER — Other Ambulatory Visit: Payer: Self-pay | Admitting: Oncology

## 2021-06-25 ENCOUNTER — Inpatient Hospital Stay: Payer: Medicare Other

## 2021-06-25 ENCOUNTER — Inpatient Hospital Stay (HOSPITAL_BASED_OUTPATIENT_CLINIC_OR_DEPARTMENT_OTHER): Payer: Medicare Other | Admitting: Oncology

## 2021-06-25 ENCOUNTER — Inpatient Hospital Stay: Payer: Medicare Other | Admitting: Licensed Clinical Social Worker

## 2021-06-25 ENCOUNTER — Other Ambulatory Visit: Payer: Self-pay

## 2021-06-25 VITALS — BP 148/90 | HR 84 | Temp 98.1°F | Resp 18 | Ht 70.0 in | Wt 145.6 lb

## 2021-06-25 VITALS — BP 132/90 | HR 74

## 2021-06-25 DIAGNOSIS — C182 Malignant neoplasm of ascending colon: Secondary | ICD-10-CM

## 2021-06-25 DIAGNOSIS — C189 Malignant neoplasm of colon, unspecified: Secondary | ICD-10-CM

## 2021-06-25 DIAGNOSIS — Z5111 Encounter for antineoplastic chemotherapy: Secondary | ICD-10-CM | POA: Diagnosis not present

## 2021-06-25 LAB — CMP (CANCER CENTER ONLY)
ALT: 18 U/L (ref 0–44)
AST: 17 U/L (ref 15–41)
Albumin: 4.3 g/dL (ref 3.5–5.0)
Alkaline Phosphatase: 114 U/L (ref 38–126)
Anion gap: 11 (ref 5–15)
BUN: 10 mg/dL (ref 6–20)
CO2: 25 mmol/L (ref 22–32)
Calcium: 9.7 mg/dL (ref 8.9–10.3)
Chloride: 105 mmol/L (ref 98–111)
Creatinine: 1.22 mg/dL (ref 0.61–1.24)
GFR, Estimated: >60 mL/min (ref >60)
Glucose, Bld: 92 mg/dL (ref 70–99)
Potassium: 4 mmol/L (ref 3.5–5.1)
Sodium: 141 mmol/L (ref 135–145)
Total Bilirubin: 0.4 mg/dL (ref 0.3–1.2)
Total Protein: 6.9 g/dL (ref 6.5–8.1)

## 2021-06-25 LAB — CBC WITH DIFFERENTIAL (CANCER CENTER ONLY)
Abs Immature Granulocytes: 0.2 10*3/uL — ABNORMAL HIGH (ref 0.00–0.07)
Band Neutrophils: 3 %
Basophils Absolute: 0.1 10*3/uL (ref 0.0–0.1)
Basophils Relative: 1 %
Eosinophils Absolute: 0.1 10*3/uL (ref 0.0–0.5)
Eosinophils Relative: 1 %
HCT: 40.8 % (ref 39.0–52.0)
Hemoglobin: 12.7 g/dL — ABNORMAL LOW (ref 13.0–17.0)
Lymphocytes Relative: 21 %
Lymphs Abs: 2.2 10*3/uL (ref 0.7–4.0)
MCH: 24.4 pg — ABNORMAL LOW (ref 26.0–34.0)
MCHC: 31.1 g/dL (ref 30.0–36.0)
MCV: 78.5 fL — ABNORMAL LOW (ref 80.0–100.0)
Metamyelocytes Relative: 1 %
Monocytes Absolute: 0.8 10*3/uL (ref 0.1–1.0)
Monocytes Relative: 8 %
Myelocytes: 1 %
Neutro Abs: 7 10*3/uL (ref 1.7–7.7)
Neutrophils Relative %: 64 %
Platelet Count: 195 10*3/uL (ref 150–400)
RBC: 5.2 MIL/uL (ref 4.22–5.81)
RDW: 18.8 % — ABNORMAL HIGH (ref 11.5–15.5)
Smear Review: ADEQUATE
WBC Count: 10.4 10*3/uL (ref 4.0–10.5)
nRBC: 0.6 % — ABNORMAL HIGH (ref 0.0–0.2)

## 2021-06-25 LAB — CEA (ACCESS): CEA (CHCC): 2.49 ng/mL (ref 0.00–5.00)

## 2021-06-25 MED ORDER — FLUOROURACIL CHEMO INJECTION 2.5 GM/50ML
400.0000 mg/m2 | Freq: Once | INTRAVENOUS | Status: AC
  Administered 2021-06-25: 700 mg via INTRAVENOUS
  Filled 2021-06-25: qty 14

## 2021-06-25 MED ORDER — SODIUM CHLORIDE 0.9 % IV SOLN
150.0000 mg | Freq: Once | INTRAVENOUS | Status: AC
  Administered 2021-06-25: 150 mg via INTRAVENOUS
  Filled 2021-06-25: qty 150

## 2021-06-25 MED ORDER — PALONOSETRON HCL INJECTION 0.25 MG/5ML
0.2500 mg | Freq: Once | INTRAVENOUS | Status: AC
  Administered 2021-06-25: 0.25 mg via INTRAVENOUS
  Filled 2021-06-25: qty 5

## 2021-06-25 MED ORDER — SODIUM CHLORIDE 0.9 % IV SOLN: Freq: Once | INTRAVENOUS | Status: AC

## 2021-06-25 MED ORDER — ATROPINE SULFATE 1 MG/ML IV SOLN
0.5000 mg | Freq: Once | INTRAVENOUS | Status: AC | PRN
  Administered 2021-06-25: 0.5 mg via INTRAVENOUS
  Filled 2021-06-25: qty 1

## 2021-06-25 MED ORDER — SODIUM CHLORIDE 0.9 % IV SOLN
180.0000 mg/m2 | Freq: Once | INTRAVENOUS | Status: AC
  Administered 2021-06-25: 320 mg via INTRAVENOUS
  Filled 2021-06-25: qty 15

## 2021-06-25 MED ORDER — SODIUM CHLORIDE 0.9 % IV SOLN
2400.0000 mg/m2 | INTRAVENOUS | Status: DC
  Administered 2021-06-25: 4300 mg via INTRAVENOUS
  Filled 2021-06-25: qty 86

## 2021-06-25 MED ORDER — SODIUM CHLORIDE 0.9 % IV SOLN
10.0000 mg | Freq: Once | INTRAVENOUS | Status: AC
  Administered 2021-06-25: 10 mg via INTRAVENOUS
  Filled 2021-06-25: qty 10

## 2021-06-25 MED ORDER — SODIUM CHLORIDE 0.9 % IV SOLN
400.0000 mg/m2 | Freq: Once | INTRAVENOUS | Status: AC
  Administered 2021-06-25: 716 mg via INTRAVENOUS
  Filled 2021-06-25: qty 25

## 2021-06-25 NOTE — Progress Notes (Signed)
Patient seen by Dr. Sherrill today ? ?Vitals are within treatment parameters. ? ?Labs reviewed by Dr. Sherrill and are within treatment parameters. ? ?Per physician team, patient is ready for treatment and there are NO modifications to the treatment plan.  ?

## 2021-06-25 NOTE — Progress Notes (Signed)
Interpreter Rachyl from [facility] at chairside interpreting for patient.

## 2021-06-25 NOTE — Patient Instructions (Addendum)
Alexander Reeves   The chemotherapy medication bag should finish at 46 hours, 96 hours, or 7 days. For example, if your pump is scheduled for 46 hours and it was put on at 4:00 p.m., it should finish at 2:00 p.m. the day it is scheduled to come off regardless of your appointment time.     Estimated time to finish at 12:15 Thursday, June 27, 2021.   If the display on your pump reads "Low Volume" and it is beeping, take the batteries out of the pump and come to the cancer center for it to be taken off.   If the pump alarms go off prior to the pump reading "Low Volume" then call [phone] and someone can assist you.  If the plunger comes out and the chemotherapy medication is leaking out, please use your home chemo spill kit to clean up the spill. Do NOT use paper towels or other household products.  If you have problems or questions regarding your pump, please call either [phone] (24 hours a day) or the cancer center Monday-Friday 8:00 a.m.- 4:30 p.m. at the clinic number and we will assist you. If you are unable to get assistance, then go to the nearest Emergency Department and ask the staff to contact the IV team for assistance.  Discharge Instructions: Thank you for choosing [facility] to provide your oncology and hematology care.   If you have a lab appointment with the [facility], please go directly to the [facility] and check in at the registration area.   Wear comfortable clothing and clothing appropriate for easy access to any Portacath or PICC line.   We strive to give you quality time with your provider. You may need to reschedule your appointment if you arrive late (15 or more minutes).  Arriving late affects you and other patients whose appointments are after yours.  Also, if you miss three or more appointments without notifying the office, you may be dismissed from the clinic at the provider's discretion.      For prescription  refill requests, have your pharmacy contact our office and allow 72 hours for refills to be completed.    Today you received the following chemotherapy and/or immunotherapy agents Irinotecan, Leucovorin, Fluorouracil      To help prevent nausea and vomiting after your treatment, we encourage you to take your nausea medication as directed.  BELOW ARE SYMPTOMS THAT SHOULD BE REPORTED IMMEDIATELY: *FEVER GREATER THAN 100.4 F (38 C) OR HIGHER *CHILLS OR SWEATING *NAUSEA AND VOMITING THAT IS NOT CONTROLLED WITH YOUR NAUSEA MEDICATION *UNUSUAL SHORTNESS OF BREATH *UNUSUAL BRUISING OR BLEEDING *URINARY PROBLEMS (pain or burning when urinating, or frequent urination) *BOWEL PROBLEMS (unusual diarrhea, constipation, pain near the anus) TENDERNESS IN MOUTH AND THROAT WITH OR WITHOUT PRESENCE OF ULCERS (sore throat, sores in mouth, or a toothache) UNUSUAL RASH, SWELLING OR PAIN  UNUSUAL VAGINAL DISCHARGE OR ITCHING   Items with * indicate a potential emergency and should be followed up as soon as possible or go to the Emergency Department if any problems should occur.  Please show the CHEMOTHERAPY ALERT CARD or IMMUNOTHERAPY ALERT CARD at check-in to the Emergency Department and triage nurse.  Should you have questions after your visit or need to cancel or reschedule your appointment, please contact [facility]  Dept: [phone]  and follow the prompts.  Office hours are 8:00 a.m. to 4:30 p.m. Monday - Friday. Please note that voicemails  left after 4:00 p.m. may not be returned until the following business day.  We are closed weekends and major holidays. You have access to a nurse at all times for urgent questions. Please call the main number to the clinic Dept: [phone] and follow the prompts.   For any non-urgent questions, you may also contact your provider using MyChart. We now offer e-Visits for anyone 45 and older to request care online for non-urgent symptoms.  For details visit [email or web address].   Also download the MyChart app! Go to the app store, search "MyChart", open the app, select [facility], and log in with your MyChart username and password.  Masks are optional in the cancer centers. If you would like for your care team to wear a mask while they are taking care of you, please let them know. For doctor visits, patients may have with them one support person who is at least 54 years old. At this time, visitors are not allowed in the infusion area.  Irinotecan injection What is this medication? IRINOTECAN (ir in oh TEE kan ) is a chemotherapy drug. It is used to treat colon and rectal cancer. This medicine may be used for other purposes; ask your health care provider or pharmacist if you have questions. COMMON BRAND NAME(S): Camptosar What should I tell my care team before I take this medication? They need to know if you have any of these conditions: dehydration diarrhea infection (especially a virus infection such as chickenpox, cold sores, or herpes) liver disease low blood counts, like low white cell, platelet, or red cell counts low levels of calcium, magnesium, or potassium in the blood recent or ongoing radiation therapy an unusual or allergic reaction to irinotecan, other medicines, foods, dyes, or preservatives pregnant or trying to get pregnant breast-feeding How should I use this medication? This drug is given as an infusion into a vein. It is administered in a hospital or clinic by a specially trained health care professional. Talk to your pediatrician regarding the use of this medicine in children. Special care may be needed. Overdosage: If you think you have taken too much of this medicine contact a poison control center or emergency room at once. NOTE: This medicine is only for you. Do not share this medicine with others. What if I miss a dose? It is important not to miss your dose. Call your doctor or health care  professional if you are unable to keep an appointment. What may interact with this medication? Do not take this medicine with any of the following medications: cobicistat itraconazole This medicine may interact with the following medications: antiviral medicines for HIV or AIDS certain antibiotics like rifampin or rifabutin certain medicines for fungal infections like ketoconazole, posaconazole, and voriconazole certain medicines for seizures like carbamazepine, phenobarbital, phenotoin clarithromycin gemfibrozil nefazodone [facility]. John's Wort This list may not describe all possible interactions. Give your health care provider a list of all the medicines, herbs, non-prescription drugs, or dietary supplements you use. Also tell them if you smoke, drink alcohol, or use illegal drugs. Some items may interact with your medicine. What should I watch for while using this medication? Your condition will be monitored carefully while you are receiving this medicine. You will need important blood work done while you are taking this medicine. This drug may make you feel generally unwell. This is not uncommon, as chemotherapy can affect healthy cells as well as cancer cells. Report any side effects. Continue your course of treatment even though you feel  ill unless your doctor tells you to stop. In some cases, you may be given additional medicines to help with side effects. Follow all directions for their use. You may get drowsy or dizzy. Do not drive, use machinery, or do anything that needs mental alertness until you know how this medicine affects you. Do not stand or sit up quickly, especially if you are an older patient. This reduces the risk of dizzy or fainting spells. Call your health care professional for advice if you get a fever, chills, or sore throat, or other symptoms of a cold or flu. Do not treat yourself. This medicine decreases your body's ability to fight infections. Try to avoid being around  people who are sick. Avoid taking products that contain aspirin, acetaminophen, ibuprofen, naproxen, or ketoprofen unless instructed by your doctor. These medicines may hide a fever. This medicine may increase your risk to bruise or bleed. Call your doctor or health care professional if you notice any unusual bleeding. Be careful brushing and flossing your teeth or using a toothpick because you may get an infection or bleed more easily. If you have any dental work done, tell your dentist you are receiving this medicine. Do not become pregnant while taking this medicine or for 6 months after stopping it. Women should inform their health care professional if they wish to become pregnant or think they might be pregnant. Men should not father a child while taking this medicine and for 3 months after stopping it. There is potential for serious side effects to an unborn child. Talk to your health care professional for more information. Do not breast-feed an infant while taking this medicine or for 7 days after stopping it. This medicine has caused ovarian failure in some women. This medicine may make it more difficult to get pregnant. Talk to your health care professional if you are concerned about your fertility. This medicine has caused decreased sperm counts in some men. This may make it more difficult to father a child. Talk to your health care professional if you are concerned about your fertility. What side effects may I notice from receiving this medication? Side effects that you should report to your doctor or health care professional as soon as possible: allergic reactions like skin rash, itching or hives, swelling of the face, lips, or tongue chest pain diarrhea flushing, runny nose, sweating during infusion low blood counts - this medicine may decrease the number of white blood cells, red blood cells and platelets. You may be at increased risk for infections and bleeding. nausea, vomiting pain,  swelling, warmth in the leg signs of decreased platelets or bleeding - bruising, pinpoint red spots on the skin, black, tarry stools, blood in the urine signs of infection - fever or chills, cough, sore throat, pain or difficulty passing urine signs of decreased red blood cells - unusually weak or tired, fainting spells, lightheadedness Side effects that usually do not require medical attention (report to your doctor or health care professional if they continue or are bothersome): constipation hair loss headache loss of appetite mouth sores stomach pain This list may not describe all possible side effects. Call your doctor for medical advice about side effects. You may report side effects to FDA at 1-800-FDA-1088. Where should I keep my medication? This drug is given in a hospital or clinic and will not be stored at home. NOTE: This sheet is a summary. It may not cover all possible information. If you have questions about this medicine, talk  to your doctor, pharmacist, or health care provider.  2023 Elsevier/Gold Standard (2020-11-30 00:00:00) Leucovorin injection What is this medication? LEUCOVORIN (loo koe VOR in) is used to prevent or treat the harmful effects of some medicines. This medicine is used to treat anemia caused by a low amount of folic acid in the body. It is also used with 5-fluorouracil (5-FU) to treat colon cancer. This medicine may be used for other purposes; ask your health care provider or pharmacist if you have questions. What should I tell my care team before I take this medication? They need to know if you have any of these conditions: anemia from low levels of vitamin B-12 in the blood an unusual or allergic reaction to leucovorin, folic acid, other medicines, foods, dyes, or preservatives pregnant or trying to get pregnant breast-feeding How should I use this medication? This medicine is for injection into a muscle or into a vein. It is given by a health care  professional in a hospital or clinic setting. Talk to your pediatrician regarding the use of this medicine in children. Special care may be needed. Overdosage: If you think you have taken too much of this medicine contact a poison control center or emergency room at once. NOTE: This medicine is only for you. Do not share this medicine with others. What if I miss a dose? This does not apply. What may interact with this medication? capecitabine fluorouracil phenobarbital phenytoin primidone trimethoprim-sulfamethoxazole This list may not describe all possible interactions. Give your health care provider a list of all the medicines, herbs, non-prescription drugs, or dietary supplements you use. Also tell them if you smoke, drink alcohol, or use illegal drugs. Some items may interact with your medicine. What should I watch for while using this medication? Your condition will be monitored carefully while you are receiving this medicine. This medicine may increase the side effects of 5-fluorouracil, 5-FU. Tell your doctor or health care professional if you have diarrhea or mouth sores that do not get better or that get worse. What side effects may I notice from receiving this medication? Side effects that you should report to your doctor or health care professional as soon as possible: allergic reactions like skin rash, itching or hives, swelling of the face, lips, or tongue breathing problems fever, infection mouth sores unusual bleeding or bruising unusually weak or tired Side effects that usually do not require medical attention (report to your doctor or health care professional if they continue or are bothersome): constipation or diarrhea loss of appetite nausea, vomiting This list may not describe all possible side effects. Call your doctor for medical advice about side effects. You may report side effects to FDA at 1-800-FDA-1088. Where should I keep my medication? This drug is given  in a hospital or clinic and will not be stored at home. NOTE: This sheet is a summary. It may not cover all possible information. If you have questions about this medicine, talk to your doctor, pharmacist, or health care provider.  2023 Elsevier/Gold Standard (2007-07-08 00:00:00) Fluorouracil, 5-FU injection What is this medication? FLUOROURACIL, 5-FU (flure oh YOOR a sil) is a chemotherapy drug. It slows the growth of cancer cells. This medicine is used to treat many types of cancer like breast cancer, colon or rectal cancer, pancreatic cancer, and stomach cancer. This medicine may be used for other purposes; ask your health care provider or pharmacist if you have questions. COMMON BRAND NAME(S): Adrucil What should I tell my care team before I take  this medication? They need to know if you have any of these conditions: blood disorders dihydropyrimidine dehydrogenase (DPD) deficiency infection (especially a virus infection such as chickenpox, cold sores, or herpes) kidney disease liver disease malnourished, poor nutrition recent or ongoing radiation therapy an unusual or allergic reaction to fluorouracil, other chemotherapy, other medicines, foods, dyes, or preservatives pregnant or trying to get pregnant breast-feeding How should I use this medication? This drug is given as an infusion or injection into a vein. It is administered in a hospital or clinic by a specially trained health care professional. Talk to your pediatrician regarding the use of this medicine in children. Special care may be needed. Overdosage: If you think you have taken too much of this medicine contact a poison control center or emergency room at once. NOTE: This medicine is only for you. Do not share this medicine with others. What if I miss a dose? It is important not to miss your dose. Call your doctor or health care professional if you are unable to keep an appointment. What may interact with this  medication? Do not take this medicine with any of the following medications: live virus vaccines This medicine may also interact with the following medications: medicines that treat or prevent blood clots like warfarin, enoxaparin, and dalteparin This list may not describe all possible interactions. Give your health care provider a list of all the medicines, herbs, non-prescription drugs, or dietary supplements you use. Also tell them if you smoke, drink alcohol, or use illegal drugs. Some items may interact with your medicine. What should I watch for while using this medication? Visit your doctor for checks on your progress. This drug may make you feel generally unwell. This is not uncommon, as chemotherapy can affect healthy cells as well as cancer cells. Report any side effects. Continue your course of treatment even though you feel ill unless your doctor tells you to stop. In some cases, you may be given additional medicines to help with side effects. Follow all directions for their use. Call your doctor or health care professional for advice if you get a fever, chills or sore throat, or other symptoms of a cold or flu. Do not treat yourself. This drug decreases your body's ability to fight infections. Try to avoid being around people who are sick. This medicine may increase your risk to bruise or bleed. Call your doctor or health care professional if you notice any unusual bleeding. Be careful brushing and flossing your teeth or using a toothpick because you may get an infection or bleed more easily. If you have any dental work done, tell your dentist you are receiving this medicine. Avoid taking products that contain aspirin, acetaminophen, ibuprofen, naproxen, or ketoprofen unless instructed by your doctor. These medicines may hide a fever. Do not become pregnant while taking this medicine. Women should inform their doctor if they wish to become pregnant or think they might be pregnant. There is  a potential for serious side effects to an unborn child. Talk to your health care professional or pharmacist for more information. Do not breast-feed an infant while taking this medicine. Men should inform their doctor if they wish to father a child. This medicine may lower sperm counts. Do not treat diarrhea with over the counter products. Contact your doctor if you have diarrhea that lasts more than 2 days or if it is severe and watery. This medicine can make you more sensitive to the sun. Keep out of the sun. If you  cannot avoid being in the sun, wear protective clothing and use sunscreen. Do not use sun lamps or tanning beds/booths. What side effects may I notice from receiving this medication? Side effects that you should report to your doctor or health care professional as soon as possible: allergic reactions like skin rash, itching or hives, swelling of the face, lips, or tongue low blood counts - this medicine may decrease the number of white blood cells, red blood cells and platelets. You may be at increased risk for infections and bleeding. signs of infection - fever or chills, cough, sore throat, pain or difficulty passing urine signs of decreased platelets or bleeding - bruising, pinpoint red spots on the skin, black, tarry stools, blood in the urine signs of decreased red blood cells - unusually weak or tired, fainting spells, lightheadedness breathing problems changes in vision chest pain mouth sores nausea and vomiting pain, swelling, redness at site where injected pain, tingling, numbness in the hands or feet redness, swelling, or sores on hands or feet stomach pain unusual bleeding Side effects that usually do not require medical attention (report to your doctor or health care professional if they continue or are bothersome): changes in finger or toe nails diarrhea dry or itchy skin hair loss headache loss of appetite sensitivity of eyes to the light stomach  upset unusually teary eyes This list may not describe all possible side effects. Call your doctor for medical advice about side effects. You may report side effects to FDA at 1-800-FDA-1088. Where should I keep my medication? This drug is given in a hospital or clinic and will not be stored at home. NOTE: This sheet is a summary. It may not cover all possible information. If you have questions about this medicine, talk to your doctor, pharmacist, or health care provider.  2023 Elsevier/Gold Standard (2020-11-30 00:00:00)

## 2021-06-25 NOTE — Progress Notes (Signed)
[facility] CSW Progress Note  Holiday representative met with patient to follow-up on conversation about housing. CSW spoke with patient via interpreter to discuss housing concerns.  Patient stated he spoke with his social worker case [place] about housing and he is assisting him with finding housing.  She did tell him the waiting list could be on average up to 6 months.  CSW and patient discussed his current application with [facility] had the correct contact information for the patient and that they were aware the patient could take phone calls.  Patient stated he was going to go personally to [facility] and would ask about his application and to confirm the contact information.  CSW introduced patient to new [facility] CS and gave him her contact information. CSW encouraged patient to contact CSW with any concerns or questions.  Patient verbalized understanding.    Adelene Amas, LCSW

## 2021-06-25 NOTE — Progress Notes (Signed)
Rachyl interpreter from [facility] interpreting for patient

## 2021-06-25 NOTE — Progress Notes (Signed)
Alexander Reeves   Diagnosis: Colon cancer  INTERVAL HISTORY:   Alexander Reeves completed another cycle of FOLFIRI on 06/11/2021.  No nausea/vomiting or diarrhea.  No bleeding.  He is here today with a sign language interpreter. He reports he plans to relocate to the [place] area to stay with a friend.  He plans to stay for 2 months.  He says his current living arrangement is too expensive.   Objective:  Vital signs in last 24 hours:  Blood pressure (!) 148/90, pulse 84, temperature 98.1 F (36.7 C), temperature source Oral, resp. rate 18, height 5' 10" (1.778 m), weight 145 lb 9.6 oz (66 kg), SpO2 96 %.    HEENT: No thrush or ulcers Resp: Lungs clear bilaterally Cardio: Regular rate and rhythm GI: No hepatosplenomegaly Vascular: No leg edema    Portacath/PICC-without erythema  Lab Results:  Lab Results  Component Value Date   WBC 10.4 06/25/2021   HGB 12.7 (L) 06/25/2021   HCT 40.8 06/25/2021   MCV 78.5 (L) 06/25/2021   PLT 195 06/25/2021   NEUTROABS 7.0 06/25/2021    CMP  Lab Results  Component Value Date   NA 141 06/25/2021   K 4.0 06/25/2021   CL 105 06/25/2021   CO2 25 06/25/2021   GLUCOSE 92 06/25/2021   BUN 10 06/25/2021   CREATININE 1.22 06/25/2021   CALCIUM 9.7 06/25/2021   PROT 6.9 06/25/2021   ALBUMIN 4.3 06/25/2021   AST 17 06/25/2021   ALT 18 06/25/2021   ALKPHOS 114 06/25/2021   BILITOT 0.4 06/25/2021   GFRNONAA >60 06/25/2021   GFRAA >60 09/28/2019    Lab Results  Component Value Date   CEA1 1.75 09/28/2019   CEA 2.49 06/25/2021    Medications: I have reviewed the patient's current medications.   Assessment/Plan:  Moderately differentiated adenocarcinoma ascending colon, stage IIIb (pT3pN1), status post a right colectomy 11/19/2018 Lymphovascular and perineural invasion present, 2/14 lymph nodes positive, tumor deposits present Positive radial margin, no loss of mismatch repair protein  expression, discussed case with Dr. Craig Staggers mass with surrounding inflammation at multiple margins, no gross residual disease, unclear which is the "positive "radial margin, further surgery and radiation not recommended Colonoscopy 11/19/2018-completely obstructing mid ascending colon mass, could not be passed with endoscope, biopsy confirmed invasive adenocarcinoma CT abdomen/pelvis 11/18/2018-wall thickening at the mid and distal ascending colon with mild distention of the proximal ascending colon and cecum CTs 10/17/2018--no acute findings, no chest lymphadenopathy, lungs clear Cycle 1 FOLFOX 12/28/2018 Cycle 2 FOLFOX 01/11/2019, Udenyca added  Cycle 3 FOLFOX 01/25/2019, Udenyca held due to bone pain Cycle 4 FOLFOX 02/21/2019, Udenyca added Cycle 5 FOLFOX 03/09/2019, Udenyca Cycle 6 FOLFOX 03/21/2019, Udenyca Cycle 7 FOLFOX 04/04/2019, Udenyca Cycle 8 FOLFOX 04/18/2019, Udenyca  Cycle 9 FOLFOX 05/02/2019, Udenyca Cycle 10 FOLFOX 05/16/2019, oxaliplatin, 5-FU bolus, and Udenyca held Cycle 11 FOLFOX 05/30/2019, oxaliplatin, 5-FU bolus, and Udenyca held Cycle 12 FOLFOX 06/15/2019, oxaliplatin, 5-FU bolus and Udenyca held CTs 09/29/2019-new hypodense enhancing liver masses consistent with metastatic disease, indistinct marginated nodularity below the pancreas head-likely small lymph nodes Biopsy liver lesion 10/18/2019-adenocarcinoma consistent with history of colorectal carcinoma Cycle 1 FOLFIRI/Avastin 11/01/2019 Cycle 2 FOLFIRI/Avastin 11/15/2019 Cycle 3 FOLFIRI/Avastin 11/29/2019 Cycle 4 FOLFIRI/Avastin 12/13/2019 CT chest 12/27/2019-no significant change in hepatic metastases, submassive pulmonary emboli Cycle 5 FOLFIRI 01/10/2020 (Avastin discontinued) Cycle 6 FOLFIRI 02/01/2020 Cycle 7 FOLFIRI 02/13/2020 Cycle 8 FOLFIRI 02/27/2020 CTs abdomen/pelvis 03/09/2020-per Dr. Gearldine Shown review overall stable disease Cycle 9 FOLFIRI  04/16/2020 Cycle 10 FOLFIRI 05/01/2020 Cycle 11 FOLFIRI 05/15/2020 Cycle 12  FOLFIRI 05/29/2020 CT abdomen/pelvis 06/07/2020- decreased size of liver lesions, no new lesions Cycle 13 FOLFIRI 06/12/2020 Cycle 14 FOLFIRI 07/10/2020 Cycle 15 FOLFIRI 07/24/2020 Cycle 16 FOLFIRI 08/07/2020 Cycle 17 FOLFIRI 08/21/2020 Cycle 18 FOLFIRI 09/04/2020 CT abdomen/pelvis 09/14/2020-stable faint liver lesions, no new lesions, no new site of metastatic disease Cycle 19 09/18/2020  Treatment held 10/09/2020 due to upper respiratory symptoms, known COVID exposure Cycle 20 FOLFIRI 10/30/2020 Cycle 21 FOLFIRI 11/20/2020 Cycle 22 FOLFIRI 12/12/2020 Cycle 23 FOLFIRI 01/01/2021, Emend added CTs 01/22/2021-faintly seen residual of prior hepatic metastatic disease seen at 2 sites, no progression.  No new findings of metastatic disease. FOLFIRI placed on hold, referred for liver MRI MRI abdomen 01/29/2021-no enhancing hepatic lesions, 9 mm hyperintense T2 signal lesion near the dome of the right hepatic lobe and 7 mm hyperintense T2 signal lesion in the inferior right hepatic lobe segment 6, no other evidence of metastatic disease Case presented at GI tumor conference 02/27/2021-not a candidate for hepatic resection, observation recommended, repeat MRI at a 36-monthinterval 05/05/2021 MRI liver-progressive multifocal hepatic metastases.  Previously there were only 2 lesions measuring up to 10 mm.  On current study there are at least 8 lesions in both lobes.  New 18 mm portacaval node.  New left para-aortic node. Cycle 24 FOLFIRI 05/14/2021 Cycle 25 FOLFIRI 05/28/2021 Cycle 26 FOLFIRI 06/11/2021 Cycle 27 FOLFIRI 06/25/2021   Deaf Right epididymal cyst removal 03/22/2018 Asthma Port-A-Cath placement, Dr. WDonne Hazel 12/23/2018 Neutropenia secondary to chemotherapy-Udenyca added for cycle 2 FOLFOX Admission with febrile neutropenia 02/08/2019 Hospital admission with submassive PE and left lower extremity DVT on 12/27/2019, heparin, transition to Xarelto 12/28/2019 CT chest 12/27/2019-submassive pulmonary emboli with  evidence of right heart strain, no significant change in hepatic metastases       Disposition: Mr. JPriskappears stable.  He is tolerating the FOLFIRI well.  The CEA is lower.  He will complete another cycle today. [place].  I am concerned it [place] be [place] [facility] [place]  He agrees to remain here to complete another cycle of chemotherapy.  We will arrange for a restaging MRI prior to next cycle of chemotherapy.  He will return for an office visit in 2 weeks.  GBetsy Coder MD  06/25/2021  10:56 AM

## 2021-06-27 ENCOUNTER — Inpatient Hospital Stay: Payer: Medicare Other

## 2021-06-27 VITALS — BP 119/75 | HR 87 | Temp 98.1°F | Resp 18

## 2021-06-27 DIAGNOSIS — C189 Malignant neoplasm of colon, unspecified: Secondary | ICD-10-CM

## 2021-06-27 DIAGNOSIS — Z5111 Encounter for antineoplastic chemotherapy: Secondary | ICD-10-CM | POA: Diagnosis not present

## 2021-06-27 DIAGNOSIS — C182 Malignant neoplasm of ascending colon: Secondary | ICD-10-CM

## 2021-06-27 MED ORDER — SODIUM CHLORIDE 0.9% FLUSH
10.0000 mL | INTRAVENOUS | Status: DC | PRN
  Administered 2021-06-27: 10 mL

## 2021-06-27 MED ORDER — HEPARIN SOD (PORK) LOCK FLUSH 100 UNIT/ML IV SOLN
500.0000 [IU] | Freq: Once | INTRAVENOUS | Status: AC | PRN
  Administered 2021-06-27: 500 [IU]

## 2021-06-27 MED ORDER — PEGFILGRASTIM-BMEZ 6 MG/0.6ML ~~LOC~~ SOSY
6.0000 mg | PREFILLED_SYRINGE | Freq: Once | SUBCUTANEOUS | Status: AC
  Administered 2021-06-27: 6 mg via SUBCUTANEOUS
  Filled 2021-06-27: qty 0.6

## 2021-06-27 NOTE — Progress Notes (Signed)
Juliann Pulse from [facility] at chairside signing for patient

## 2021-06-27 NOTE — Patient Instructions (Signed)
Implanted Port Home Guide An implanted port is a device that is placed under the skin. It is usually placed in the chest. The device may vary based on the need. Implanted ports can be used to give IV medicine, to take blood, or to give fluids. You may have an implanted port if: You need IV medicine that would be irritating to the small veins in your hands or arms. You need IV medicines, such as chemotherapy, for a long period of time. You need IV nutrition for a long period of time. You may have fewer limitations when using a port than you would if you used other types of long-term IVs. You will also likely be able to return to normal activities after your incision heals. An implanted port has two main parts: Reservoir. The reservoir is the part where a needle is inserted to give medicines or draw blood. The reservoir is round. After the port is placed, it appears as a small, raised area under your skin. Catheter. The catheter is a small, thin tube that connects the reservoir to a vein. Medicine that is inserted into the reservoir goes into the catheter and then into the vein. How is my port accessed? To access your port: A numbing cream may be placed on the skin over the port site. Your health care provider will put on a mask and sterile gloves. The skin over your port will be cleaned carefully with a germ-killing soap and allowed to dry. Your health care provider will gently pinch the port and insert a needle into it. Your health care provider will check for a blood return to make sure the port is in the vein and is still working (patent). If your port needs to remain accessed to get medicine continuously (constant infusion), your health care provider will place a clear bandage (dressing) over the needle site. The dressing and needle will need to be changed every week, or as told by your health care provider. What is flushing? Flushing helps keep the port working. Follow instructions from your  health care provider about how and when to flush the port. Ports are usually flushed with saline solution or a medicine called heparin. The need for flushing will depend on how the port is used: If the port is only used from time to time to give medicines or draw blood, the port may need to be flushed: Before and after medicines have been given. Before and after blood has been drawn. As part of routine maintenance. Flushing may be recommended every 4-6 weeks. If a constant infusion is running, the port may not need to be flushed. Throw away any syringes in a disposal container that is meant for sharp items (sharps container). You can buy a sharps container from a pharmacy, or you can make one by using an empty hard plastic bottle with a cover. How long will my port stay implanted? The port can stay in for as long as your health care provider thinks it is needed. When it is time for the port to come out, a surgery will be done to remove it. The surgery will be similar to the procedure that was done to put the port in. Follow these instructions at home: Caring for your port and port site Flush your port as told by your health care provider. If you need an infusion over several days, follow instructions from your health care provider about how to take care of your port site. Make sure you: Change your   dressing as told by your health care provider. Wash your hands with soap and water for at least 20 seconds before and after you change your dressing. If soap and water are not available, use alcohol-based hand sanitizer. Place any used dressings or infusion bags into a plastic bag. Throw that bag in the trash. Keep the dressing that covers the needle clean and dry. Do not get it wet. Do not use scissors or sharp objects near the infusion tubing. Keep any external tubes clamped, unless they are being used. Check your port site every day for signs of infection. Check for: Redness, swelling, or  pain. Fluid or blood. Warmth. Pus or a bad smell. Protect the skin around the port site. Avoid wearing bra straps that rub or irritate the site. Protect the skin around your port from seat belts. Place a soft pad over your chest if needed. Bathe or shower as told by your health care provider. The site may get wet as long as you are not actively receiving an infusion. General instructions  Return to your normal activities as told by your health care provider. Ask your health care provider what activities are safe for you. Carry a medical alert card or wear a medical alert bracelet at all times. This will let health care providers know that you have an implanted port in case of an emergency. Where to find more information [name] Cancer Society: www.cancer.org [name] Society of Clinical Oncology: www.cancer.net Contact a health care provider if: You have a fever or chills. You have redness, swelling, or pain at the port site. You have fluid or blood coming from your port site. Your incision feels warm to the touch. You have pus or a bad smell coming from the port site. Summary Implanted ports are usually placed in the chest for long-term IV access. Follow instructions from your health care provider about flushing the port and changing bandages (dressings). Take care of the area around your port by avoiding clothing that puts pressure on the area, and by watching for signs of infection. Protect the skin around your port from seat belts. Place a soft pad over your chest if needed. Contact a health care provider if you have a fever or you have redness, swelling, pain, fluid, or a bad smell at the port site. This information is not intended to replace advice given to you by your health care provider. Make sure you discuss any questions you have with your health care provider. Document Revised: 07/03/2020 Document Reviewed: 07/03/2020 Elsevier Patient Education  2023 Elsevier  Inc.  Pegfilgrastim Injection What is this medication? PEGFILGRASTIM (PEG fil gra stim) lowers the risk of infection in people who are receiving chemotherapy. It works by helping your body make more white blood cells, which protects your body from infection. It may also be used to help people who have been exposed to high doses of radiation. This medicine may be used for other purposes; ask your health care provider or pharmacist if you have questions. COMMON BRAND NAME(S): Fulphila, Fylnetra, Neulasta, Nyvepria, Stimufend, UDENYCA, Ziextenzo What should I tell my care team before I take this medication? They need to know if you have any of these conditions: Kidney disease Latex allergy Ongoing radiation therapy Sickle cell disease Skin reactions to acrylic adhesives (On-Body Injector only) An unusual or allergic reaction to pegfilgrastim, filgrastim, other medications, foods, dyes, or preservatives Pregnant or trying to get pregnant Breast-feeding How should I use this medication? This medication is for injection under the   skin. If you get this medication at home, you will be taught how to prepare and give the pre-filled syringe or how to use the On-body Injector. Refer to the patient Instructions for Use for detailed instructions. Use exactly as directed. Tell your care team immediately if you suspect that the On-body Injector may not have performed as intended or if you suspect the use of the On-body Injector resulted in a missed or partial dose. It is important that you put your used needles and syringes in a special sharps container. Do not put them in a trash can. If you do not have a sharps container, call your pharmacist or care team to get one. Talk to your care team about the use of this medication in children. While this medication may be prescribed for selected conditions, precautions do apply. Overdosage: If you think you have taken too much of this medicine contact a poison control  center or emergency room at once. NOTE: This medicine is only for you. Do not share this medicine with others. What if I miss a dose? It is important not to miss your dose. Call your care team if you miss your dose. If you miss a dose due to an On-body Injector failure or leakage, a new dose should be administered as soon as possible using a single prefilled syringe for manual use. What may interact with this medication? Interactions have not been studied. This list may not describe all possible interactions. Give your health care provider a list of all the medicines, herbs, non-prescription drugs, or dietary supplements you use. Also tell them if you smoke, drink alcohol, or use illegal drugs. Some items may interact with your medicine. What should I watch for while using this medication? Your condition will be monitored carefully while you are receiving this medication. You may need blood work done while you are taking this medication. Talk to your care team about your risk of cancer. You may be more at risk for certain types of cancer if you take this medication. If you are going to need a MRI, CT scan, or other procedure, tell your care team that you are using this medication (On-Body Injector only). What side effects may I notice from receiving this medication? Side effects that you should report to your care team as soon as possible: Allergic reactions--skin rash, itching, hives, swelling of the face, lips, tongue, or throat Capillary leak syndrome--stomach or muscle pain, unusual weakness or fatigue, feeling faint or lightheaded, decrease in the amount of urine, swelling of the ankles, hands, or feet, trouble breathing High white blood cell level--fever, fatigue, trouble breathing, night sweats, change in vision, weight loss Inflammation of the aorta--fever, fatigue, back, chest, or stomach pain, severe headache Kidney injury (glomerulonephritis)--decrease in the amount of urine, red or dark  brown urine, foamy or bubbly urine, swelling of the ankles, hands, or feet Shortness of breath or trouble breathing Spleen injury--pain in upper left stomach or shoulder Unusual bruising or bleeding Side effects that usually do not require medical attention (report to your care team if they continue or are bothersome): Bone pain Pain in the hands or feet This list may not describe all possible side effects. Call your doctor for medical advice about side effects. You may report side effects to FDA at 1-800-FDA-1088. Where should I keep my medication? Keep out of the reach of children. If you are using this medication at home, you will be instructed on how to store it. Throw away any unused   medication after the expiration date on the label. NOTE: This sheet is a summary. It may not cover all possible information. If you have questions about this medicine, talk to your doctor, pharmacist, or health care provider.  2023 Elsevier/Gold Standard (2020-11-30 00:00:00)  

## 2021-06-28 ENCOUNTER — Encounter: Payer: Self-pay | Admitting: *Deleted

## 2021-06-28 NOTE — Progress Notes (Signed)
Call to [facility] to schedule MRI-staff member would not schedule scan with nurse due to patient not being in the office. Informed staff member it needs to be asap and that patient is deaf (uses service for translations). Was assured they will call him today.

## 2021-07-07 ENCOUNTER — Other Ambulatory Visit: Payer: Self-pay | Admitting: Nurse Practitioner

## 2021-07-07 ENCOUNTER — Other Ambulatory Visit: Payer: Self-pay | Admitting: Oncology

## 2021-07-07 DIAGNOSIS — C182 Malignant neoplasm of ascending colon: Secondary | ICD-10-CM

## 2021-07-08 ENCOUNTER — Encounter: Payer: Self-pay | Admitting: Oncology

## 2021-07-09 ENCOUNTER — Encounter: Payer: Self-pay | Admitting: *Deleted

## 2021-07-09 ENCOUNTER — Inpatient Hospital Stay: Payer: Medicare Other

## 2021-07-09 ENCOUNTER — Inpatient Hospital Stay (HOSPITAL_BASED_OUTPATIENT_CLINIC_OR_DEPARTMENT_OTHER): Payer: Medicare Other | Admitting: Oncology

## 2021-07-09 ENCOUNTER — Other Ambulatory Visit (INDEPENDENT_AMBULATORY_CARE_PROVIDER_SITE_OTHER): Payer: Self-pay | Admitting: Primary Care

## 2021-07-09 ENCOUNTER — Other Ambulatory Visit: Payer: Self-pay

## 2021-07-09 VITALS — BP 122/88 | HR 93 | Temp 98.2°F | Resp 18 | Ht 70.0 in | Wt 145.6 lb

## 2021-07-09 VITALS — BP 122/61 | HR 70

## 2021-07-09 DIAGNOSIS — C182 Malignant neoplasm of ascending colon: Secondary | ICD-10-CM

## 2021-07-09 DIAGNOSIS — Z760 Encounter for issue of repeat prescription: Secondary | ICD-10-CM

## 2021-07-09 DIAGNOSIS — C189 Malignant neoplasm of colon, unspecified: Secondary | ICD-10-CM

## 2021-07-09 DIAGNOSIS — Z5111 Encounter for antineoplastic chemotherapy: Secondary | ICD-10-CM | POA: Diagnosis not present

## 2021-07-09 DIAGNOSIS — J4520 Mild intermittent asthma, uncomplicated: Secondary | ICD-10-CM

## 2021-07-09 LAB — CBC WITH DIFFERENTIAL (CANCER CENTER ONLY)
Abs Immature Granulocytes: 0.48 10*3/uL — ABNORMAL HIGH (ref 0.00–0.07)
Basophils Absolute: 0.1 10*3/uL (ref 0.0–0.1)
Basophils Relative: 1 %
Eosinophils Absolute: 0.3 10*3/uL (ref 0.0–0.5)
Eosinophils Relative: 3 %
HCT: 39.7 % (ref 39.0–52.0)
Hemoglobin: 12.2 g/dL — ABNORMAL LOW (ref 13.0–17.0)
Immature Granulocytes: 5 %
Lymphocytes Relative: 20 %
Lymphs Abs: 2 10*3/uL (ref 0.7–4.0)
MCH: 24.6 pg — ABNORMAL LOW (ref 26.0–34.0)
MCHC: 30.7 g/dL (ref 30.0–36.0)
MCV: 80 fL (ref 80.0–100.0)
Monocytes Absolute: 0.7 10*3/uL (ref 0.1–1.0)
Monocytes Relative: 7 %
Neutro Abs: 6.2 10*3/uL (ref 1.7–7.7)
Neutrophils Relative %: 64 %
Platelet Count: 180 10*3/uL (ref 150–400)
RBC: 4.96 MIL/uL (ref 4.22–5.81)
RDW: 20.4 % — ABNORMAL HIGH (ref 11.5–15.5)
WBC Count: 9.8 10*3/uL (ref 4.0–10.5)
nRBC: 0.4 % — ABNORMAL HIGH (ref 0.0–0.2)

## 2021-07-09 LAB — CMP (CANCER CENTER ONLY)
ALT: 21 U/L (ref 0–44)
AST: 15 U/L (ref 15–41)
Albumin: 4.1 g/dL (ref 3.5–5.0)
Alkaline Phosphatase: 118 U/L (ref 38–126)
Anion gap: 10 (ref 5–15)
BUN: 9 mg/dL (ref 6–20)
CO2: 26 mmol/L (ref 22–32)
Calcium: 9.3 mg/dL (ref 8.9–10.3)
Chloride: 105 mmol/L (ref 98–111)
Creatinine: 1.29 mg/dL — ABNORMAL HIGH (ref 0.61–1.24)
GFR, Estimated: >60 mL/min (ref >60)
Glucose, Bld: 89 mg/dL (ref 70–99)
Potassium: 4.2 mmol/L (ref 3.5–5.1)
Sodium: 141 mmol/L (ref 135–145)
Total Bilirubin: 0.4 mg/dL (ref 0.3–1.2)
Total Protein: 6.6 g/dL (ref 6.5–8.1)

## 2021-07-09 LAB — CEA (ACCESS): CEA (CHCC): 2.03 ng/mL (ref 0.00–5.00)

## 2021-07-09 MED ORDER — SODIUM CHLORIDE 0.9 % IV SOLN
180.0000 mg/m2 | Freq: Once | INTRAVENOUS | Status: AC
  Administered 2021-07-09: 320 mg via INTRAVENOUS
  Filled 2021-07-09: qty 2

## 2021-07-09 MED ORDER — ATROPINE SULFATE 1 MG/ML IV SOLN
0.5000 mg | Freq: Once | INTRAVENOUS | Status: AC | PRN
  Administered 2021-07-09: 0.5 mg via INTRAVENOUS
  Filled 2021-07-09: qty 1

## 2021-07-09 MED ORDER — SODIUM CHLORIDE 0.9 % IV SOLN
10.0000 mg | Freq: Once | INTRAVENOUS | Status: AC
  Administered 2021-07-09: 10 mg via INTRAVENOUS
  Filled 2021-07-09: qty 10

## 2021-07-09 MED ORDER — SODIUM CHLORIDE 0.9 % IV SOLN
2400.0000 mg/m2 | INTRAVENOUS | Status: DC
  Administered 2021-07-09: 4300 mg via INTRAVENOUS
  Filled 2021-07-09: qty 86

## 2021-07-09 MED ORDER — MONTELUKAST SODIUM 10 MG PO TABS: 10.0000 mg | ORAL_TABLET | Freq: Every day | ORAL | 1 refills | Status: AC

## 2021-07-09 MED ORDER — SODIUM CHLORIDE 0.9 % IV SOLN
150.0000 mg | Freq: Once | INTRAVENOUS | Status: AC
  Administered 2021-07-09: 150 mg via INTRAVENOUS
  Filled 2021-07-09: qty 150

## 2021-07-09 MED ORDER — SODIUM CHLORIDE 0.9 % IV SOLN
400.0000 mg/m2 | Freq: Once | INTRAVENOUS | Status: AC
  Administered 2021-07-09: 716 mg via INTRAVENOUS
  Filled 2021-07-09: qty 35.8

## 2021-07-09 MED ORDER — SODIUM CHLORIDE 0.9 % IV SOLN: Freq: Once | INTRAVENOUS | Status: AC

## 2021-07-09 MED ORDER — PALONOSETRON HCL INJECTION 0.25 MG/5ML
0.2500 mg | Freq: Once | INTRAVENOUS | Status: AC
  Administered 2021-07-09: 0.25 mg via INTRAVENOUS
  Filled 2021-07-09: qty 5

## 2021-07-09 MED ORDER — FLUOROURACIL CHEMO INJECTION 2.5 GM/50ML
400.0000 mg/m2 | Freq: Once | INTRAVENOUS | Status: AC
  Administered 2021-07-09: 700 mg via INTRAVENOUS
  Filled 2021-07-09: qty 14

## 2021-07-09 MED ORDER — FENOFIBRATE 145 MG PO TABS: 145.0000 mg | ORAL_TABLET | Freq: Every day | ORAL | 0 refills | Status: DC

## 2021-07-09 MED ORDER — ALBUTEROL SULFATE HFA 108 (90 BASE) MCG/ACT IN AERS: INHALATION_SPRAY | RESPIRATORY_TRACT | 2 refills | Status: DC

## 2021-07-09 NOTE — Telephone Encounter (Signed)
Medication Refill - Medication: fenofibrate (TRICOR) 145 MG tablet and albuterol (VENTOLIN HFA) 108 (90 Base) MCG/ACT inhaler  Has the patient contacted their pharmacy? No patient uses sign language (Agent: If no, request that the patient contact the pharmacy for the refill. If patient does not wish to contact the pharmacy document the reason why and proceed with request.) (Agent: If yes, when and what did the pharmacy advise?)patient uses sign language  Preferred Pharmacy (with phone number or street name): [id number] - [place] ([place]), [place] - [address]  Phone:  [phone] Fax:  [phone]  Has the patient been seen for an appointment in the last year OR does the patient have an upcoming appointment? no  Agent: Please be advised that RX refills may take up to 3 business days. We ask that you follow-up with your pharmacy.

## 2021-07-11 ENCOUNTER — Inpatient Hospital Stay: Payer: Medicare Other

## 2021-07-11 VITALS — BP 130/85 | HR 81 | Temp 98.2°F | Resp 18

## 2021-07-11 DIAGNOSIS — C189 Malignant neoplasm of colon, unspecified: Secondary | ICD-10-CM

## 2021-07-11 DIAGNOSIS — Z5111 Encounter for antineoplastic chemotherapy: Secondary | ICD-10-CM | POA: Diagnosis not present

## 2021-07-11 DIAGNOSIS — C182 Malignant neoplasm of ascending colon: Secondary | ICD-10-CM

## 2021-07-11 MED ORDER — SODIUM CHLORIDE 0.9% FLUSH
10.0000 mL | INTRAVENOUS | Status: DC | PRN
  Administered 2021-07-11: 10 mL

## 2021-07-11 MED ORDER — PEGFILGRASTIM-BMEZ 6 MG/0.6ML ~~LOC~~ SOSY
6.0000 mg | PREFILLED_SYRINGE | Freq: Once | SUBCUTANEOUS | Status: AC
  Administered 2021-07-11: 6 mg via SUBCUTANEOUS
  Filled 2021-07-11: qty 0.6

## 2021-07-11 MED ORDER — HEPARIN SOD (PORK) LOCK FLUSH 100 UNIT/ML IV SOLN
500.0000 [IU] | Freq: Once | INTRAVENOUS | Status: AC | PRN
  Administered 2021-07-11: 500 [IU]

## 2021-07-11 NOTE — Patient Instructions (Signed)
Pegfilgrastim Injection What is this medication? PEGFILGRASTIM (PEG fil gra stim) lowers the risk of infection in people who are receiving chemotherapy. It works by helping your body make more white blood cells, which protects your body from infection. It may also be used to help people who have been exposed to high doses of radiation. This medicine may be used for other purposes; ask your health care provider or pharmacist if you have questions. COMMON BRAND NAME(S): Fulphila, Fylnetra, Neulasta, Nyvepria, Stimufend, UDENYCA, Ziextenzo What should I tell my care team before I take this medication? They need to know if you have any of these conditions: Kidney disease Latex allergy Ongoing radiation therapy Sickle cell disease Skin reactions to acrylic adhesives (On-Body Injector only) An unusual or allergic reaction to pegfilgrastim, filgrastim, other medications, foods, dyes, or preservatives Pregnant or trying to get pregnant Breast-feeding How should I use this medication? This medication is for injection under the skin. If you get this medication at home, you will be taught how to prepare and give the pre-filled syringe or how to use the On-body Injector. Refer to the patient Instructions for Use for detailed instructions. Use exactly as directed. Tell your care team immediately if you suspect that the On-body Injector may not have performed as intended or if you suspect the use of the On-body Injector resulted in a missed or partial dose. It is important that you put your used needles and syringes in a special sharps container. Do not put them in a trash can. If you do not have a sharps container, call your pharmacist or care team to get one. Talk to your care team about the use of this medication in children. While this medication may be prescribed for selected conditions, precautions do apply. Overdosage: If you think you have taken too much of this medicine contact a poison control center  or emergency room at once. NOTE: This medicine is only for you. Do not share this medicine with others. What if I miss a dose? It is important not to miss your dose. Call your care team if you miss your dose. If you miss a dose due to an On-body Injector failure or leakage, a new dose should be administered as soon as possible using a single prefilled syringe for manual use. What may interact with this medication? Interactions have not been studied. This list may not describe all possible interactions. Give your health care provider a list of all the medicines, herbs, non-prescription drugs, or dietary supplements you use. Also tell them if you smoke, drink alcohol, or use illegal drugs. Some items may interact with your medicine. What should I watch for while using this medication? Your condition will be monitored carefully while you are receiving this medication. You may need blood work done while you are taking this medication. Talk to your care team about your risk of cancer. You may be more at risk for certain types of cancer if you take this medication. If you are going to need a MRI, CT scan, or other procedure, tell your care team that you are using this medication (On-Body Injector only). What side effects may I notice from receiving this medication? Side effects that you should report to your care team as soon as possible: Allergic reactions--skin rash, itching, hives, swelling of the face, lips, tongue, or throat Capillary leak syndrome--stomach or muscle pain, unusual weakness or fatigue, feeling faint or lightheaded, decrease in the amount of urine, swelling of the ankles, hands, or feet, trouble   breathing High white blood cell level--fever, fatigue, trouble breathing, night sweats, change in vision, weight loss Inflammation of the aorta--fever, fatigue, back, chest, or stomach pain, severe headache Kidney injury (glomerulonephritis)--decrease in the amount of urine, red or dark brown  urine, foamy or bubbly urine, swelling of the ankles, hands, or feet Shortness of breath or trouble breathing Spleen injury--pain in upper left stomach or shoulder Unusual bruising or bleeding Side effects that usually do not require medical attention (report to your care team if they continue or are bothersome): Bone pain Pain in the hands or feet This list may not describe all possible side effects. Call your doctor for medical advice about side effects. You may report side effects to FDA at 1-800-FDA-1088. Where should I keep my medication? Keep out of the reach of children. If you are using this medication at home, you will be instructed on how to store it. Throw away any unused medication after the expiration date on the label. NOTE: This sheet is a summary. It may not cover all possible information. If you have questions about this medicine, talk to your doctor, pharmacist, or health care provider.  2023 Elsevier/Gold Standard (2020-11-30 00:00:00)  Implanted [facility] Guide An implanted port is a device that is placed under the skin. It is usually placed in the chest. The device may vary based on the need. Implanted ports can be used to give IV medicine, to take blood, or to give fluids. You may have an implanted port if: You need IV medicine that would be irritating to the small veins in your hands or arms. You need IV medicines, such as chemotherapy, for a long period of time. You need IV nutrition for a long period of time. You may have fewer limitations when using a port than you would if you used other types of long-term IVs. You will also likely be able to return to normal activities after your incision heals. An implanted port has two main parts: Reservoir. The reservoir is the part where a needle is inserted to give medicines or draw blood. The reservoir is round. After the port is placed, it appears as a small, raised area under your skin. Catheter. The catheter is a small,  thin tube that connects the reservoir to a vein. Medicine that is inserted into the reservoir goes into the catheter and then into the vein. How is my port accessed? To access your port: A numbing cream may be placed on the skin over the port site. Your health care provider will put on a mask and sterile gloves. The skin over your port will be cleaned carefully with a germ-killing soap and allowed to dry. Your health care provider will gently pinch the port and insert a needle into it. Your health care provider will check for a blood return to make sure the port is in the vein and is still working (patent). If your port needs to remain accessed to get medicine continuously (constant infusion), your health care provider will place a clear bandage (dressing) over the needle site. The dressing and needle will need to be changed every week, or as told by your health care provider. What is flushing? Flushing helps keep the port working. Follow instructions from your health care provider about how and when to flush the port. Ports are usually flushed with saline solution or a medicine called heparin. The need for flushing will depend on how the port is used: If the port is only used from time  to time to give medicines or draw blood, the port may need to be flushed: Before and after medicines have been given. Before and after blood has been drawn. As part of routine maintenance. Flushing may be recommended every 4-6 weeks. If a constant infusion is running, the port may not need to be flushed. Throw away any syringes in a disposal container that is meant for sharp items (sharps container). You can buy a sharps container from a pharmacy, or you can make one by using an empty hard plastic bottle with a cover. How long will my port stay implanted? The port can stay in for as long as your health care provider thinks it is needed. When it is time for the port to come out, a surgery will be done to remove it.  The surgery will be similar to the procedure that was done to put the port in. Follow these instructions at home: Caring for your port and port site Flush your port as told by your health care provider. If you need an infusion over several days, follow instructions from your health care provider about how to take care of your port site. Make sure you: Change your dressing as told by your health care provider. Wash your hands with soap and water for at least 20 seconds before and after you change your dressing. If soap and water are not available, use alcohol-based hand sanitizer. Place any used dressings or infusion bags into a plastic bag. Throw that bag in the trash. Keep the dressing that covers the needle clean and dry. Do not get it wet. Do not use scissors or sharp objects near the infusion tubing. Keep any external tubes clamped, unless they are being used. Check your port site every day for signs of infection. Check for: Redness, swelling, or pain. Fluid or blood. Warmth. Pus or a bad smell. Protect the skin around the port site. Avoid wearing bra straps that rub or irritate the site. Protect the skin around your port from seat belts. Place a soft pad over your chest if needed. Bathe or shower as told by your health care provider. The site may get wet as long as you are not actively receiving an infusion. General instructions  Return to your normal activities as told by your health care provider. Ask your health care provider what activities are safe for you. Carry a medical alert card or wear a medical alert bracelet at all times. This will let health care providers know that you have an implanted port in case of an emergency. Where to find more information [name] Cancer Society: www.cancer.Ethel of Clinical Oncology: www.cancer.net Contact a health care provider if: You have a fever or chills. You have redness, swelling, or pain at the port site. You have  fluid or blood coming from your port site. Your incision feels warm to the touch. You have pus or a bad smell coming from the port site. Summary Implanted ports are usually placed in the chest for long-term IV access. Follow instructions from your health care provider about flushing the port and changing bandages (dressings). Take care of the area around your port by avoiding clothing that puts pressure on the area, and by watching for signs of infection. Protect the skin around your port from seat belts. Place a soft pad over your chest if needed. Contact a health care provider if you have a fever or you have redness, swelling, pain, fluid, or a bad smell at the port  site. This information is not intended to replace advice given to you by your health care provider. Make sure you discuss any questions you have with your health care provider. Document Revised: 07/03/2020 Document Reviewed: 07/03/2020 Elsevier Patient Education  [place].

## 2021-07-11 NOTE — Progress Notes (Signed)
Legrand Como from [facility] interpreting for patient at chairside

## 2021-07-14 ENCOUNTER — Other Ambulatory Visit: Payer: Medicare Other

## 2021-07-15 ENCOUNTER — Other Ambulatory Visit: Payer: Self-pay | Admitting: *Deleted

## 2021-07-15 ENCOUNTER — Other Ambulatory Visit: Payer: Self-pay

## 2021-07-15 ENCOUNTER — Ambulatory Visit
Admission: RE | Admit: 2021-07-15 | Discharge: 2021-07-15 | Disposition: A | Discharge status: Home / Self Care | Payer: Medicare Other | Source: Ambulatory Visit | Attending: Oncology | Admitting: Oncology

## 2021-07-15 DIAGNOSIS — C182 Malignant neoplasm of ascending colon: Secondary | ICD-10-CM

## 2021-07-15 MED ORDER — SODIUM CHLORIDE 0.9% FLUSH
10.0000 mL | INTRAVENOUS | Status: DC | PRN
  Administered 2021-07-15: 10 mL via INTRAVENOUS

## 2021-07-15 MED ORDER — HEPARIN SOD (PORK) LOCK FLUSH 100 UNIT/ML IV SOLN
500.0000 [IU] | Freq: Once | INTRAVENOUS | Status: AC
  Administered 2021-07-15: 500 [IU] via INTRAVENOUS

## 2021-07-15 MED ORDER — GADOBENATE DIMEGLUMINE 529 MG/ML IV SOLN
13.0000 mL | Freq: Once | INTRAVENOUS | Status: AC | PRN
  Administered 2021-07-15: 13 mL via INTRAVENOUS

## 2021-07-17 ENCOUNTER — Telehealth: Payer: Self-pay

## 2021-07-17 NOTE — Telephone Encounter (Signed)
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

## 2021-07-17 NOTE — Telephone Encounter (Signed)
Message sent with interpreter services.

## 2021-08-01 ENCOUNTER — Other Ambulatory Visit (INDEPENDENT_AMBULATORY_CARE_PROVIDER_SITE_OTHER): Payer: Self-pay | Admitting: Primary Care

## 2021-08-01 DIAGNOSIS — J4520 Mild intermittent asthma, uncomplicated: Secondary | ICD-10-CM

## 2021-08-01 NOTE — Telephone Encounter (Signed)
Medication Refill - Medication: albuterol (VENTOLIN HFA) 108 (90 Base) MCG/ACT inhaler  Pt stated he is on vacation and is running low. Just needs this one-time Asking for a refill to be sent to the pharmacy below.   Has the patient contacted their pharmacy? No.  (Agent: If no, request that the patient contact the pharmacy for the refill. If patient does not wish to contact the pharmacy document the reason why and proceed with request.)   Preferred Pharmacy (with phone number or street name):  [id number] [place] [place] [address] Phone: [phone]: [phone] Hours: Open 24 hours   Has the patient been seen for an appointment in the last year OR does the patient have an upcoming appointment? No.  Agent: Please be advised that RX refills may take up to 3 business days. We ask that you follow-up with your pharmacy.

## 2021-08-02 NOTE — Telephone Encounter (Signed)
Requested medication (s) are due for refill today: yes  Requested medication (s) are on the active medication list: yes  Last refill:  07/09/21  Future visit scheduled: no  Notes to clinic:  [place] and needing 1 time refill. Please advise     Requested Prescriptions  Pending Prescriptions Disp Refills   albuterol (VENTOLIN HFA) 108 (90 Base) MCG/ACT inhaler 9 g 2    Sig: INHALE 1 TO 2 PUFFS BY MOUTH EVERY 4 TO 6 HOURS AS NEEDED FOR ASTHMA     Pulmonology:  Beta Agonists 2 Passed - 08/01/2021  1:42 PM      Passed - Last BP in normal range    BP Readings from Last 1 Encounters:  07/11/21 130/85         Passed - Last Heart Rate in normal range    Pulse Readings from Last 1 Encounters:  07/11/21 81         Passed - Valid encounter within last 12 months    Recent Outpatient Visits           10 months ago Arthralgia, unspecified joint   [place] [name], NP   1 year ago Nonspeaking deaf   [place] [name], NP   1 year ago Nonspeaking deaf   [place] [name], NP   1 year ago Arthralgia, unspecified joint   [place] [name], NP   2 years ago Annual physical exam   [place] [name], NP

## 2021-08-04 ENCOUNTER — Other Ambulatory Visit: Payer: Self-pay | Admitting: Oncology

## 2021-08-05 ENCOUNTER — Encounter: Payer: Self-pay | Admitting: Oncology

## 2021-08-05 ENCOUNTER — Other Ambulatory Visit: Payer: Self-pay

## 2021-08-05 ENCOUNTER — Telehealth: Payer: Self-pay | Admitting: *Deleted

## 2021-08-05 NOTE — Telephone Encounter (Signed)
Notified by pharmacy that [name] canceled his appointments for tomorrow. Called and left VM asking if he is still out of town and when does he wish to resume his treatment?

## 2021-08-06 ENCOUNTER — Inpatient Hospital Stay: Payer: Medicare Other | Admitting: Nurse Practitioner

## 2021-08-06 ENCOUNTER — Other Ambulatory Visit: Payer: Self-pay

## 2021-08-06 ENCOUNTER — Inpatient Hospital Stay: Payer: Medicare Other

## 2021-08-08 ENCOUNTER — Inpatient Hospital Stay: Payer: Medicare Other

## 2021-08-15 ENCOUNTER — Ambulatory Visit: Payer: Medicare Other | Admitting: Podiatry

## 2021-08-20 ENCOUNTER — Encounter: Payer: Self-pay | Admitting: Oncology

## 2021-08-27 ENCOUNTER — Encounter: Payer: Self-pay | Admitting: Oncology

## 2021-08-29 ENCOUNTER — Ambulatory Visit: Payer: Medicare HMO | Admitting: Podiatry

## 2021-09-03 ENCOUNTER — Inpatient Hospital Stay: Payer: Medicare HMO | Admitting: Nurse Practitioner

## 2021-09-03 ENCOUNTER — Inpatient Hospital Stay: Payer: Medicare HMO

## 2021-09-05 ENCOUNTER — Inpatient Hospital Stay: Payer: Medicare HMO

## 2021-09-19 ENCOUNTER — Other Ambulatory Visit (INDEPENDENT_AMBULATORY_CARE_PROVIDER_SITE_OTHER): Payer: Self-pay | Admitting: Primary Care

## 2021-09-19 DIAGNOSIS — C182 Malignant neoplasm of ascending colon: Secondary | ICD-10-CM

## 2021-09-19 NOTE — Addendum Note (Signed)
Addended by: Matilde Sprang on: 09/19/2021 11:38 AM   Modules accepted: Orders

## 2021-09-19 NOTE — Telephone Encounter (Signed)
Medication Refill - Medication: rivaroxaban (XARELTO) 20 MG TABS tablet  Has the patient contacted their pharmacy? Yes.   (Agent: If no, request that the patient contact the pharmacy for the refill. If patient does not wish to contact the pharmacy document the reason why and proceed with request.) (Agent: If yes, when and what did the pharmacy advise?)  Preferred Pharmacy (with phone number or street name[facility] [place] Address: [address], [place] [address] Departments: [place]  Phone: [phone] Has the patient been seen for an appointment in the last year OR does the patient have an upcoming appointment? Yes.   Patient is out of town and would like to know if he can get a refill on his med due to he will be out there for a while.  Agent: Please be advised that RX refills may take up to 3 business days. We ask that you follow-up with your pharmacy.

## 2021-09-20 ENCOUNTER — Other Ambulatory Visit (INDEPENDENT_AMBULATORY_CARE_PROVIDER_SITE_OTHER): Payer: Self-pay | Admitting: Primary Care

## 2021-09-20 DIAGNOSIS — G620 Drug-induced polyneuropathy: Secondary | ICD-10-CM

## 2021-09-20 DIAGNOSIS — C182 Malignant neoplasm of ascending colon: Secondary | ICD-10-CM

## 2021-09-20 NOTE — Telephone Encounter (Signed)
Medication Refill - Medication:  fenofibrate (TRICOR) 145 MG tablet pantoprazole (PROTONIX) 40 MG tablet rivaroxaban (XARELTO) 20 MG TABS tablet gabapentin (NEURONTIN) 300 MG capsule.  Has the patient contacted their pharmacy? No.  Preferred Pharmacy (with phone number or street name):  [id number] [place]  [phone] Fax:  [phone]    Has the patient been seen for an appointment in the last year OR does the patient have an upcoming appointment? No.  Agent: Please be advised that RX refills may take up to 3 business days. We ask that you follow-up with your pharmacy.

## 2021-09-20 NOTE — Telephone Encounter (Signed)
Requested medication (s) are due for refill today: Yes  Requested medication (s) are on the active medication list: Yes  Last refill:  07/24/20  Future visit scheduled: No  Notes to clinic:  Prescription has expired, last filled by Ned Card N.P.    Requested Prescriptions  Pending Prescriptions Disp Refills   rivaroxaban (XARELTO) 20 MG TABS tablet 30 tablet 5    Sig: Take 1 tablet (20 mg total) by mouth daily with supper.     Hematology: Anticoagulants - rivaroxaban Failed - 09/19/2021 11:38 AM      Failed - Cr in normal range and within 360 days    Creatinine  Date Value Ref Range Status  07/09/2021 1.29 (H) 0.61 - 1.24 mg/dL Final         Failed - HGB in normal range and within 360 days    Hemoglobin  Date Value Ref Range Status  07/09/2021 12.2 (L) 13.0 - 17.0 g/dL Final  08/25/2019 14.1 13.0 - 17.7 g/dL Final         Passed - ALT in normal range and within 360 days    ALT  Date Value Ref Range Status  07/09/2021 21 0 - 44 U/L Final         Passed - AST in normal range and within 360 days    AST  Date Value Ref Range Status  07/09/2021 15 15 - 41 U/L Final         Passed - HCT in normal range and within 360 days    HCT  Date Value Ref Range Status  07/09/2021 39.7 39.0 - 52.0 % Final   Hematocrit  Date Value Ref Range Status  08/25/2019 43.6 37.5 - 51.0 % Final         Passed - PLT in normal range and within 360 days    Platelets  Date Value Ref Range Status  08/25/2019 215 150 - 450 x10E3/uL Final   Platelet Count  Date Value Ref Range Status  07/09/2021 180 150 - 400 K/uL Final         Passed - eGFR is 15 or above and within 360 days    GFR, Est AFR Am  Date Value Ref Range Status  09/28/2019 >60 >60 mL/min Final   GFR, Estimated  Date Value Ref Range Status  07/09/2021 >60 >60 mL/min Final    Comment:    (NOTE) Calculated using the CKD-EPI Creatinine Equation (2021)          Passed - Patient is not pregnant      Passed - Valid  encounter within last 12 months    Recent Outpatient Visits           11 months ago Arthralgia, unspecified joint   [place], Michelle P, NP   1 year ago Nonspeaking deaf   [place], Michelle P, NP   1 year ago Nonspeaking deaf   [place] [name], NP   2 years ago Arthralgia, unspecified joint   [place] [name], NP   2 years ago Annual physical exam   [place] [name], NP

## 2021-09-23 ENCOUNTER — Other Ambulatory Visit (INDEPENDENT_AMBULATORY_CARE_PROVIDER_SITE_OTHER): Payer: Self-pay | Admitting: Primary Care

## 2021-09-23 DIAGNOSIS — J4520 Mild intermittent asthma, uncomplicated: Secondary | ICD-10-CM

## 2021-09-23 NOTE — Telephone Encounter (Signed)
Medication Refill - Medication: albuterol (VENTOLIN HFA) 108 (90 Base) MCG/ACT inhaler  /pt forgot to add this to other request   Has the patient contacted their pharmacy? No./ pt is out of town on vacation and wants sent to the out of town pharmacy   (Agent: If no, request that the patient contact the pharmacy for the refill. If patient does not wish to contact the pharmacy document the reason why and proceed with request.) (Agent: If yes, when and what did the pharmacy advise?)  Preferred Pharmacy (with phone number or street name): [id number] [place], [place] been seen for an appointment in the last year OR does the patient have an upcoming appointment? Yes.    Agent: Please be advised that RX refills may take up to 3 business days. We ask that you follow-up with your pharmacy.

## 2021-09-23 NOTE — Telephone Encounter (Signed)
Will forward to provider  

## 2021-09-24 MED ORDER — ALBUTEROL SULFATE HFA 108 (90 BASE) MCG/ACT IN AERS: INHALATION_SPRAY | RESPIRATORY_TRACT | 0 refills | Status: AC

## 2021-09-24 MED ORDER — FENOFIBRATE 145 MG PO TABS: 145.0000 mg | ORAL_TABLET | Freq: Every day | ORAL | 0 refills | Status: AC

## 2021-09-24 NOTE — Telephone Encounter (Signed)
Will forward to provider  

## 2021-09-24 NOTE — Telephone Encounter (Signed)
Requested Prescriptions  Pending Prescriptions Disp Refills  . albuterol (VENTOLIN HFA) 108 (90 Base) MCG/ACT inhaler 9 g 2    Sig: INHALE 1 TO 2 PUFFS BY MOUTH EVERY 4 TO 6 HOURS AS NEEDED FOR ASTHMA     Pulmonology:  Beta Agonists 2 Passed - 09/23/2021  3:51 PM      Passed - Last BP in normal range    BP Readings from Last 1 Encounters:  07/11/21 130/85         Passed - Last Heart Rate in normal range    Pulse Readings from Last 1 Encounters:  07/11/21 81         Passed - Valid encounter within last 12 months    Recent Outpatient Visits          11 months ago Arthralgia, unspecified joint   [employer] [name], NP   1 year ago Nonspeaking deaf   [place] [name], NP   1 year ago Nonspeaking deaf   [place] [name], NP   2 years ago Arthralgia, unspecified joint   [place] [name], NP   2 years ago Annual physical exam   [place] [name], NP

## 2021-09-24 NOTE — Telephone Encounter (Signed)
Patient's sister in law called in about his medications,that are refused,patient is deaf and doesn't have contact with these other prescribers. [facility]  [id number] [place] [place]:  [phone] Fax:  [phone]    Gabapentin 300 mg Oral 2 times daily  Protocol Details  Pantoprazole Sodium 40 mg Oral Daily  Protocol Details  Rivaroxaban 20 mg Oral Daily with

## 2021-09-24 NOTE — Telephone Encounter (Signed)
Requested medications are due for refill today.  yes  Requested medications are on the active medications list.  yes  Last refill. Various  Future visit scheduled.   no  Notes to clinic.  3 of the 4 Rx's were signed by other providers.     Requested Prescriptions  Pending Prescriptions Disp Refills   fenofibrate (TRICOR) 145 MG tablet 90 tablet 0    Sig: Take 1 tablet (145 mg total) by mouth daily.     Cardiovascular:  Antilipid - Fibric Acid Derivatives Failed - 09/20/2021  3:42 PM      Failed - Cr in normal range and within 360 days    Creatinine  Date Value Ref Range Status  07/09/2021 1.29 (H) 0.61 - 1.24 mg/dL Final         Failed - HGB in normal range and within 360 days    Hemoglobin  Date Value Ref Range Status  07/09/2021 12.2 (L) 13.0 - 17.0 g/dL Final  08/25/2019 14.1 13.0 - 17.7 g/dL Final         Failed - Lipid Panel in normal range within the last 12 months    Cholesterol, Total  Date Value Ref Range Status  02/19/2021 157 100 - 199 mg/dL Final   LDL Chol Calc (NIH)  Date Value Ref Range Status  02/19/2021 89 0 - 99 mg/dL Final   HDL  Date Value Ref Range Status  02/19/2021 49 >39 mg/dL Final   Triglycerides  Date Value Ref Range Status  02/19/2021 105 0 - 149 mg/dL Final         Passed - ALT in normal range and within 360 days    ALT  Date Value Ref Range Status  07/09/2021 21 0 - 44 U/L Final         Passed - AST in normal range and within 360 days    AST  Date Value Ref Range Status  07/09/2021 15 15 - 41 U/L Final         Passed - HCT in normal range and within 360 days    HCT  Date Value Ref Range Status  07/09/2021 39.7 39.0 - 52.0 % Final   Hematocrit  Date Value Ref Range Status  08/25/2019 43.6 37.5 - 51.0 % Final         Passed - PLT in normal range and within 360 days    Platelets  Date Value Ref Range Status  08/25/2019 215 150 - 450 x10E3/uL Final   Platelet Count  Date Value Ref Range Status  07/09/2021 180 150  - 400 K/uL Final         Passed - WBC in normal range and within 360 days    WBC  Date Value Ref Range Status  04/20/2021 6.6 4.0 - 10.5 K/uL Final   WBC Count  Date Value Ref Range Status  07/09/2021 9.8 4.0 - 10.5 K/uL Final         Passed - eGFR is 30 or above and within 360 days    GFR, Est AFR Am  Date Value Ref Range Status  09/28/2019 >60 >60 mL/min Final   GFR, Estimated  Date Value Ref Range Status  07/09/2021 >60 >60 mL/min Final    Comment:    (NOTE) Calculated using the CKD-EPI Creatinine Equation (2021)          Passed - Valid encounter within last 12 months    Recent Outpatient Visits  11 months ago Arthralgia, unspecified joint   [place] [name], NP   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     Hematology: Anticoagulants - rivaroxaban Failed - 09/20/2021  3:42 PM      Failed - Cr in normal range and within 360 days    Creatinine  Date Value Ref Range Status  07/09/2021 1.29 (H) 0.61 - 1.24 mg/dL Final         Failed - HGB in normal range and within 360 days    Hemoglobin  Date Value Ref Range Status  07/09/2021 12.2 (L) 13.0 - 17.0 g/dL Final  08/25/2019 14.1 13.0 - 17.7 g/dL Final         Passed - ALT in normal range and within 360 days    ALT  Date Value Ref Range Status  07/09/2021 21 0 - 44 U/L Final         Passed - AST in normal range and within 360 days    AST  Date Value Ref Range Status  07/09/2021 15 15 - 41 U/L Final         Passed - HCT in normal range and within 360 days    HCT  Date Value Ref Range Status  07/09/2021 39.7 39.0 -  52.0 % Final   Hematocrit  Date Value Ref Range Status  08/25/2019 43.6 37.5 - 51.0 % Final         Passed - PLT in normal range and within 360 days    Platelets  Date Value Ref Range Status  08/25/2019 215 150 - 450 x10E3/uL Final   Platelet Count  Date Value Ref Range Status  07/09/2021 180 150 - 400 K/uL Final         Passed - eGFR is 15 or above and within 360 days    GFR, Est AFR Am  Date Value Ref Range Status  09/28/2019 >60 >60 mL/min Final   GFR, Estimated  Date Value Ref Range Status  07/09/2021 >60 >60 mL/min Final    Comment:    (NOTE) Calculated using the CKD-EPI Creatinine Equation (2021)          Passed - Patient is not pregnant      Passed - Valid encounter within last 12 months    Recent Outpatient Visits           11 months ago Arthralgia, unspecified joint   [place], [place] P, NP   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

## 2021-09-25 ENCOUNTER — Other Ambulatory Visit: Payer: Self-pay | Admitting: *Deleted

## 2021-09-26 ENCOUNTER — Telehealth: Payer: Self-pay

## 2021-09-26 NOTE — Telephone Encounter (Signed)
Faxed [phone] over a referral to [facility]. I spoke with Luella Cook the patient sister about the new patient referral. She  agree to have the referral sent to [place].

## 2021-09-27 ENCOUNTER — Telehealth: Payer: Self-pay

## 2021-09-27 NOTE — Telephone Encounter (Signed)
Jeanett Schlein called in and requested chemo flow notes and the patient chemo treatment plan.Faxed  [phone] over  chemo flow notes and chemo treatment plan.

## 2021-10-09 ENCOUNTER — Telehealth (INDEPENDENT_AMBULATORY_CARE_PROVIDER_SITE_OTHER): Payer: Self-pay | Admitting: Primary Care

## 2021-10-09 NOTE — Telephone Encounter (Signed)
Will forward to provider  

## 2021-10-09 NOTE — Progress Notes (Signed)
Medical records faxed to [place] (p: [phone], [phone]) at [facility] in [place] as requested.

## 2021-10-09 NOTE — Telephone Encounter (Signed)
Copied from [place] [id number]. Topic: Referral - Request for Referral >> Oct 09, 2021  1:49 PM Sabas Sous wrote: Has patient seen PCP for this complaint? Yes.   *If NO, is insurance requiring patient see PCP for this issue before PCP can refer them? Referral for which specialty: Oncology  Preferred provider/office: NPI: [id number] [facility] Dr. Marjo Bicker in [facility]. Reason for referral: Colon Cancer, patient has recently moved.   Best contact: [phone] Direct Line [place].

## 2021-10-10 NOTE — Telephone Encounter (Signed)
Call received from [facility] regarding request for referral.  She explained that they received patient's medical records from his oncologist and now they just need a referral from PCP in order to see the patient. He has an appointment in 2 weeks.   I informed her that the provider only practices in [place], the patient will need to establish care with a PCP in [place].  She said it could take 6 months to be seen as a new patient. She plans to ask the son to call patient's insurance company and request an over ride to allow them to see the patient.

## 2021-10-22 ENCOUNTER — Encounter: Payer: Self-pay | Admitting: *Deleted

## 2021-10-22 NOTE — Progress Notes (Signed)
Call from [facility] w/[facility]. Patient being seen today and they have no imaging reports.  Faxed last #3 liver MRI reports and last #2 CT reports to [phone]

## 2022-01-13 DEATH — deceased

## 2022-02-11 ENCOUNTER — Telehealth: Payer: Self-pay | Admitting: Emergency Medicine

## 2022-02-11 NOTE — Telephone Encounter (Signed)
Copied from [place] [phone]. Topic: General - Other >> Feb 11, 2022 11:15 AM [place] B wrote: Reason for CRM: patients mother called in asking can she use his medical card to buy food,, not sure what actual card she is referring took just said [facility] card, not sure if patient had food assistance from [place]

## 2022-12-03 ENCOUNTER — Encounter: Payer: Self-pay | Admitting: Oncology

## 2022-12-03 NOTE — Telephone Encounter (Signed)
Telephone call
